# Patient Record
Sex: Female | Born: 1950 | Race: Black or African American | Hispanic: No | Marital: Married | Smoking: Former smoker
Health system: Southern US, Community
[De-identification: ages and names within clinical notes are randomized; demographics above are authoritative.]

## PROBLEM LIST (undated history)

## (undated) DIAGNOSIS — D649 Anemia, unspecified: Secondary | ICD-10-CM

## (undated) DIAGNOSIS — C801 Malignant (primary) neoplasm, unspecified: Secondary | ICD-10-CM

## (undated) DIAGNOSIS — Z9221 Personal history of antineoplastic chemotherapy: Secondary | ICD-10-CM

## (undated) HISTORY — PX: ABDOMINAL HYSTERECTOMY: SUR658

## (undated) HISTORY — PX: GANGLION CYST EXCISION: SHX1691

## (undated) HISTORY — PX: OTHER SURGICAL HISTORY: SHX169

## (undated) HISTORY — DX: Malignant (primary) neoplasm, unspecified: C80.1

## (undated) HISTORY — PX: TUBAL LIGATION: SHX77

## (undated) HISTORY — PX: ABDOMINAL HYSTERECTOMY: SHX81

## (undated) HISTORY — DX: Anemia, unspecified: D64.9

---

## 1999-12-01 ENCOUNTER — Other Ambulatory Visit: Admission: RE | Admit: 1999-12-01 | Discharge: 1999-12-01 | Payer: Self-pay | Admitting: *Deleted

## 1999-12-15 ENCOUNTER — Other Ambulatory Visit: Admission: RE | Admit: 1999-12-15 | Discharge: 1999-12-15 | Payer: Self-pay | Admitting: *Deleted

## 1999-12-15 ENCOUNTER — Encounter (INDEPENDENT_AMBULATORY_CARE_PROVIDER_SITE_OTHER): Payer: Self-pay

## 2005-04-06 ENCOUNTER — Ambulatory Visit: Payer: Self-pay | Admitting: Internal Medicine

## 2005-05-11 ENCOUNTER — Ambulatory Visit: Payer: Self-pay | Admitting: Internal Medicine

## 2006-12-31 ENCOUNTER — Encounter: Payer: Self-pay | Admitting: Internal Medicine

## 2008-10-01 ENCOUNTER — Ambulatory Visit: Payer: Self-pay | Admitting: Family Medicine

## 2008-10-01 ENCOUNTER — Encounter (INDEPENDENT_AMBULATORY_CARE_PROVIDER_SITE_OTHER): Payer: Self-pay | Admitting: *Deleted

## 2008-10-01 LAB — CONVERTED CEMR LAB
ALT: 18 units/L (ref 0–35)
AST: 29 units/L (ref 0–37)
Albumin: 3.5 g/dL (ref 3.5–5.2)
Alkaline Phosphatase: 68 units/L (ref 39–117)
BUN: 14 mg/dL (ref 6–23)
Basophils Absolute: 0 10*3/uL (ref 0.0–0.1)
Basophils Relative: 0.2 % (ref 0.0–3.0)
Bilirubin, Direct: 0 mg/dL (ref 0.0–0.3)
CO2: 29 meq/L (ref 19–32)
Calcium: 9.3 mg/dL (ref 8.4–10.5)
Chloride: 108 meq/L (ref 96–112)
Cholesterol: 182 mg/dL (ref 0–200)
Creatinine, Ser: 1.1 mg/dL (ref 0.4–1.2)
Eosinophils Absolute: 0.2 10*3/uL (ref 0.0–0.7)
Eosinophils Relative: 4.2 % (ref 0.0–5.0)
GFR calc non Af Amer: 65.45 mL/min (ref >60)
Glucose, Bld: 88 mg/dL (ref 70–99)
HCT: 37.9 % (ref 36.0–46.0)
HDL: 47.7 mg/dL (ref >39.00)
Hemoglobin: 12.9 g/dL (ref 12.0–15.0)
LDL Cholesterol: 119 mg/dL — ABNORMAL HIGH (ref 0–99)
Lymphocytes Relative: 38.6 % (ref 12.0–46.0)
Lymphs Abs: 1.7 10*3/uL (ref 0.7–4.0)
MCHC: 33.9 g/dL (ref 30.0–36.0)
MCV: 92.5 fL (ref 78.0–100.0)
Monocytes Absolute: 0.4 10*3/uL (ref 0.1–1.0)
Monocytes Relative: 8.7 % (ref 3.0–12.0)
Neutro Abs: 2.1 10*3/uL (ref 1.4–7.7)
Neutrophils Relative %: 48.3 % (ref 43.0–77.0)
Platelets: 277 10*3/uL (ref 150.0–400.0)
Potassium: 4.2 meq/L (ref 3.5–5.1)
RBC: 4.1 M/uL (ref 3.87–5.11)
RDW: 12.8 % (ref 11.5–14.6)
Sodium: 143 meq/L (ref 135–145)
TSH: 0.89 microintl units/mL (ref 0.35–5.50)
Total Bilirubin: 0.7 mg/dL (ref 0.3–1.2)
Total CHOL/HDL Ratio: 4
Total Protein: 7.9 g/dL (ref 6.0–8.3)
Triglycerides: 77 mg/dL (ref 0.0–149.0)
VLDL: 15.4 mg/dL (ref 0.0–40.0)
WBC: 4.4 10*3/uL — ABNORMAL LOW (ref 4.5–10.5)

## 2010-07-12 ENCOUNTER — Encounter: Payer: Self-pay | Admitting: Family Medicine

## 2010-07-13 ENCOUNTER — Encounter: Payer: Self-pay | Admitting: Family Medicine

## 2010-07-22 NOTE — Letter (Signed)
Summary: External Other/MINUTE CLINIC VISIT  External Other/MINUTE CLINIC VISIT   Imported By: Job Founds 01/12/2007 13:46:16  _____________________________________________________________________  External Attachment:    Type:   Image     Comment:   External Document

## 2010-07-22 NOTE — Assessment & Plan Note (Signed)
Summary: new est. cpx/alr   Vital Signs:  Patient profile:   60 year old female Height:      53.75 inches Weight:      184 pounds BMI:     44.94 Pulse rate:   66 / minute Resp:     14 per minute BP sitting:   132 / 84  (left arm)  Vitals Entered By: Doristine Devoid (October 01, 2008 8:07 AM) CC: new est- cpx    Family History:    CAD-no    HTN-no    DM-no    STROKE-no    COLON CA-no    BREAST CA-no   Impression & Recommendations:  Problem # 1:  HEALTHY ADULT FEMALE (ICD-V70.0) Assessment New  Orders: Venipuncture ([address]) Radiology Referral (Radiology) EKG w/ Interpretation ([address]) TLB-Lipid Panel (80061-LIPID) TLB-BMP (Basic Metabolic Panel-BMET) (80048-METABOL) TLB-CBC Platelet - w/Differential (85025-CBCD) TLB-Hepatic/Liver Function Pnl (80076-HEPATIC) TLB-TSH (Thyroid Stimulating Hormone) (84443-TSH)  Problem # 2:  SPECIAL SCREENING FOR MALIGNANT NEOPLASMS COLON (ICD-V76.51)  Orders: Gastroenterology Referral (GI)  Complete Medication List: 1)  Fluticasone Propionate 50 Mcg/act Susp (Fluticasone propionate) .... 2 sprays each nostril once daily  Patient Instructions: 1)  Please schedule a follow-up appointment in 1 year or as needed 2)  We will notify you of your lab results 3)  Someone will call you with your GI and Mammogram appts 4)  Remember to stay as active as possible- work on the diet and exercise now will pay off later 5)  Call with any questions or concerns 6)  Happy Spring! Prescriptions: FLUTICASONE PROPIONATE 50 MCG/ACT  SUSP (FLUTICASONE PROPIONATE) 2 sprays each nostril once daily  #1 x 3   Entered and Authorized by:   Neena Rhymes MD   Signed by:   Neena Rhymes MD on 10/01/2008   Method used:   Print then Give to Patient   RxID:   [id number]   Appended Document: new est. cpx/alr     Vital Signs:  Patient profile:   60 year old female Pulse rate:   66 / minute Resp:     14 per minute BP sitting:   132 /  84  History of Present Illness: 60 yo woman here today for CPE and to establish care.  saw Dr Drue Novel 'once- yrs ago'.  pt had GYN- Mabry but he retired 'years ago'.  has not had a mammogram in 9 yrs, never had colonoscopy, 7-8 yrs since last bimanual exam.  pt w/out complaints today.  Preventive Screening-Counseling & Management     Alcohol drinks/day: 0     Smoking Status: never     Does Patient Exercise: no      Sexual History:  currently monogamous.        Drug Use:  never.    Allergies (verified): No Known Drug Allergies  Past History:  Past Surgical History:    hysterectomy due to fibroids.  OVARIES REMAIN  Social History:    married, 3 children.  daughter in [place], daughter in [place], son locally    Smoking Status:  never    Does Patient Exercise:  no    Sexual History:  currently monogamous    Drug Use:  never  Review of Systems  The patient denies anorexia, fever, weight loss, weight gain, vision loss, decreased hearing, hoarseness, chest pain, syncope, dyspnea on exertion, peripheral edema, prolonged cough, headaches, abdominal pain, melena, hematochezia, hematuria, incontinence, suspicious skin lesions, depression, abnormal bleeding, enlarged lymph nodes, and breast masses.    Physical Exam  General:  Well-developed,well-nourished,in no acute distress; alert,appropriate and cooperative throughout examination Head:  Normocephalic and atraumatic without obvious abnormalities. No apparent alopecia or balding. Eyes:  No corneal or conjunctival inflammation noted. EOMI. Perrla. Funduscopic exam benign, without hemorrhages, exudates or papilledema. Vision grossly normal. Ears:  External ear exam shows no significant lesions or deformities.  Otoscopic examination reveals clear canals, tympanic membranes are intact bilaterally without bulging, retraction, inflammation or discharge. Hearing is grossly normal bilaterally. Nose:  edematous turbinates Mouth:  + post nasal  drip Neck:  No deformities, masses, or tenderness noted. Breasts:  No mass, nodules, thickening, tenderness, bulging, retraction, inflamation, nipple discharge or skin changes noted.   Lungs:  Normal respiratory effort, chest expands symmetrically. Lungs are clear to auscultation, no crackles or wheezes. Heart:  Normal rate and regular rhythm. S1 and S2 normal without gallop, murmur, click, rub or other extra sounds. Abdomen:  Bowel sounds positive,abdomen soft and non-tender without masses, organomegaly or hernias noted. Genitalia:  no fullness or adnexal tenderness on bimanual exam.  normal external genitalia Msk:  No deformity or scoliosis noted of thoracic or lumbar spine.   Pulses:  +2 carotid, radial, DP Extremities:  No clubbing, cyanosis, edema, or deformity noted with normal full range of motion of all joints.   Neurologic:  No cranial nerve deficits noted. Station and gait are normal. Plantar reflexes are down-going bilaterally. DTRs are symmetrical throughout. Sensory, motor and coordinative functions appear intact. Skin:  Intact without suspicious lesions or rashes Cervical Nodes:  No lymphadenopathy noted Axillary Nodes:  No palpable lymphadenopathy Psych:  Cognition and judgment appear intact. Alert and cooperative with normal attention span and concentration. No apparent delusions, illusions, hallucinations   Impression & Recommendations:  Problem # 1:  HEALTHY ADULT FEMALE (ICD-V70.0) pt's PE WNL.  stressed importance of diet, exercise, and routine health maintainence.  labs collected.  will refer for mammo and colonoscopy.  Pt expresses understanding and is in agreement w/ this plan.  Complete Medication List: 1)  Fluticasone Propionate 50 Mcg/act Susp (Fluticasone propionate) .... 2 sprays each nostril once daily

## 2010-07-22 NOTE — Letter (Signed)
Summary: Results Follow up Letter  [place] [facility]  [address] [place], [place] [address]   Phone: [phone]  Fax: [phone]    10/01/2008 MRN: [id number]  Margaret Elliott [address] [place], [place]  [address]  Dear [name],  The following are the results of your recent test(s):  Test         Result    Pap Smear:        Normal _____  Not Normal _____ Comments: ______________________________________________________ Cholesterol: LDL(Bad cholesterol):         Your goal is less than:         HDL (Good cholesterol):       Your goal is more than: Comments:  ______________________________________________________ Mammogram:        Normal _____  Not Normal _____ Comments:  ___________________________________________________________________ Hemoccult:        Normal _____  Not normal _______ Comments:    _____________________________________________________________________ Other Tests: PLEASE SEE COPY OF LABS FROM 10/01/08- keep up the good work!  labs look great!    We routinely do not discuss normal results over the telephone.  If you desire a copy of the results, or you have any questions about this information we can discuss them at your next office visit.   Sincerely,

## 2011-11-10 ENCOUNTER — Ambulatory Visit: Payer: BC Managed Care – PPO | Admitting: Family Medicine

## 2011-11-10 VITALS — BP 159/82 | HR 81 | Temp 97.9°F | Resp 18 | Ht 64.5 in | Wt 185.0 lb

## 2011-11-10 DIAGNOSIS — J0100 Acute maxillary sinusitis, unspecified: Secondary | ICD-10-CM

## 2011-11-10 DIAGNOSIS — J329 Chronic sinusitis, unspecified: Secondary | ICD-10-CM

## 2011-11-10 DIAGNOSIS — J029 Acute pharyngitis, unspecified: Secondary | ICD-10-CM

## 2011-11-10 LAB — POCT CBC
Granulocyte percent: 62.4 %G (ref 37–80)
Hemoglobin: 11.3 g/dL — AB (ref 12.2–16.2)
MCH, POC: 28.6 pg (ref 27–31.2)
MCHC: 31.6 g/dL — AB (ref 31.8–35.4)
MCV: 90.7 fL (ref 80–97)
MID (cbc): 0.7 (ref 0–0.9)
MPV: 8.3 fL (ref 0–99.8)
POC Granulocyte: 5.4 (ref 2–6.9)
POC LYMPH PERCENT: 30 %L (ref 10–50)
POC MID %: 7.6 %M (ref 0–12)
Platelet Count, POC: 378 10*3/uL (ref 142–424)
RBC: 3.95 M/uL — AB (ref 4.04–5.48)
WBC: 8.6 10*3/uL (ref 4.6–10.2)

## 2011-11-10 MED ORDER — AMOXICILLIN 500 MG PO CAPS: 500.0000 mg | ORAL_CAPSULE | Freq: Three times a day (TID) | ORAL | Status: AC

## 2011-11-10 MED ORDER — PREDNISONE 20 MG PO TABS: 20.0000 mg | ORAL_TABLET | Freq: Every day | ORAL | Status: AC

## 2011-11-10 NOTE — Progress Notes (Signed)
  Subjective:    Patient ID: Margaret Elliott, female    DOB: 1951-02-09, 61 y.o.   MRN: [id number]  HPI  1 week history of severe ST, pnd and cough. Friday seen at a local [facility] and diagnosed with seasonal rhinitis after rapid strep negative E prescibed cough suppressant   Fever and chills Review of Systems     Objective:   Physical Exam  Constitutional: She appears well-developed.  HENT:  Right Ear: Tympanic membrane is retracted.  Left Ear: Tympanic membrane is retracted.  Nose: Mucosal edema present. Rhinorrhea: purulent.  Mouth/Throat: Posterior oropharyngeal edema (associated wit hpurulent PND) present.  Eyes: Conjunctivae are normal.  Neck: Neck supple.  Cardiovascular: Normal rate, regular rhythm and normal heart sounds.   Pulmonary/Chest: Effort normal and breath sounds normal.  Lymphadenopathy:    She has cervical adenopathy.  Skin: Skin is warm.          Results for orders placed in visit on 11/10/11  POCT CBC      Component Value Range   WBC 8.6  4.6 - 10.2 (K/uL)   Lymph, poc 2.6  0.6 - 3.4    POC LYMPH PERCENT 30.0  10 - 50 (%L)   MID (cbc) 0.7  0 - 0.9    POC MID % 7.6  0 - 12 (%M)   POC Granulocyte 5.4  2 - 6.9    Granulocyte percent 62.4  37 - 80 (%G)   RBC 3.95 (*) 4.04 - 5.48 (M/uL)   Hemoglobin 11.3 (*) 12.2 - 16.2 (g/dL)   HCT, POC 40.9 (*) 81.1 - 47.9 (%)   MCV 90.7  80 - 97 (fL)   MCH, POC 28.6  27 - 31.2 (pg)   MCHC 31.6 (*) 31.8 - 35.4 (g/dL)   RDW, POC 91.4     Platelet Count, POC 378  142 - 424 (K/uL)   MPV 8.3  0 - 99.8 (fL)     Assessment & Plan:  Sinusitis Pharyngitis secondary to PND  See medications on AVS Anticipatory guidance

## 2011-11-23 ENCOUNTER — Other Ambulatory Visit: Payer: Self-pay | Admitting: Family Medicine

## 2011-11-26 ENCOUNTER — Other Ambulatory Visit: Payer: Self-pay | Admitting: Family Medicine

## 2012-07-06 ENCOUNTER — Encounter: Payer: Self-pay | Admitting: Family Medicine

## 2012-09-30 ENCOUNTER — Ambulatory Visit (HOSPITAL_BASED_OUTPATIENT_CLINIC_OR_DEPARTMENT_OTHER)
Admission: RE | Admit: 2012-09-30 | Discharge: 2012-09-30 | Disposition: A | Discharge status: Home / Self Care | Payer: BC Managed Care – PPO | Source: Ambulatory Visit | Attending: Family Medicine | Admitting: Family Medicine

## 2012-09-30 ENCOUNTER — Ambulatory Visit (INDEPENDENT_AMBULATORY_CARE_PROVIDER_SITE_OTHER): Payer: BC Managed Care – PPO | Admitting: Family Medicine

## 2012-09-30 VITALS — BP 120/78 | HR 102 | Temp 98.1°F | Wt 166.0 lb

## 2012-09-30 DIAGNOSIS — R102 Pelvic and perineal pain unspecified side: Secondary | ICD-10-CM | POA: Insufficient documentation

## 2012-09-30 DIAGNOSIS — N949 Unspecified condition associated with female genital organs and menstrual cycle: Secondary | ICD-10-CM

## 2012-09-30 DIAGNOSIS — Z90710 Acquired absence of both cervix and uterus: Secondary | ICD-10-CM | POA: Insufficient documentation

## 2012-09-30 LAB — BASIC METABOLIC PANEL
Chloride: 105 mEq/L (ref 96–112)
Creat: 1.06 mg/dL (ref 0.50–1.10)
Potassium: 4.2 mEq/L (ref 3.5–5.3)
Sodium: 138 mEq/L (ref 135–145)

## 2012-09-30 LAB — POCT URINALYSIS DIPSTICK
Bilirubin, UA: NEGATIVE
Glucose, UA: NEGATIVE
Leukocytes, UA: NEGATIVE
Nitrite, UA: NEGATIVE
Urobilinogen, UA: 0.2

## 2012-09-30 LAB — CBC WITH DIFFERENTIAL/PLATELET
Basophils Absolute: 0 10*3/uL (ref 0.0–0.1)
Eosinophils Relative: 2 % (ref 0–5)
Lymphocytes Relative: 23 % (ref 12–46)
Neutro Abs: 4.9 10*3/uL (ref 1.7–7.7)
Neutrophils Relative %: 66 % (ref 43–77)
Platelets: 430 10*3/uL — ABNORMAL HIGH (ref 150–400)
RDW: 17.8 % — ABNORMAL HIGH (ref 11.5–15.5)
WBC: 7.5 10*3/uL (ref 4.0–10.5)

## 2012-09-30 NOTE — Assessment & Plan Note (Signed)
New.  R sided.  No obvious cause.  Pt s/p hysterectomy but ovaries remain.  Get labs including CA125.  [place] to assess.  UA WNL.  Next steps depending on results.  Reviewed supportive care and red flags that should prompt return.  Pt expressed understanding and is in agreement w/ plan.

## 2012-09-30 NOTE — Progress Notes (Signed)
  Subjective:    Patient ID: Margaret Elliott, female    DOB: [birth year], 62 y.o.   MRN: [id number]  HPI abd pain- having intermittent abdominal pain occuring on either side of central abdomen.  Will have increased intra-abdominal pressure while standing.  + TTP over suprapubic area.  2 yrs ago had similar sxs- went to GYN and Urology.  Last GYN exam was Dec 2012.  No N/V.  Pt felt constipated a couple of weeks ago- started Miralax and fiber and sxs resolved.  No vaginal bleeding.  No change in appetite.  Pt reports feeling bloated around the middle.  No early satiety.  No sensation of things dropping when bearing down for BM.   Review of Systems For ROS see HPI     Objective:   Physical Exam  Vitals reviewed. Constitutional: She appears well-developed and well-nourished. No distress.  Cardiovascular: Normal rate, regular rhythm, normal heart sounds and intact distal pulses.   Pulmonary/Chest: Effort normal and breath sounds normal. No respiratory distress. She has no wheezes. She has no rales.  Abdominal: Soft. Bowel sounds are normal. She exhibits distension. She exhibits no mass. There is tenderness (R pelvic tenderness on both internal and external exams). There is no rebound and no guarding.  Genitourinary:  Uterus surgically absent No obvious adnexal mass on bimanual exam          Assessment & Plan:

## 2012-09-30 NOTE — Patient Instructions (Addendum)
We'll notify you of your lab results and your ultrasound Based on this, we'll determine the next steps Call with any questions or concerns If your symptoms change or worsen, please go to the ER Hang in there!!!

## 2012-10-01 LAB — CA 125: CA 125: 3.9 U/mL (ref 0.0–30.2)

## 2012-10-04 ENCOUNTER — Telehealth: Payer: Self-pay | Admitting: *Deleted

## 2012-10-04 DIAGNOSIS — D649 Anemia, unspecified: Secondary | ICD-10-CM

## 2012-10-04 NOTE — Telephone Encounter (Signed)
Pt wants to wait for Dr Beverely Low

## 2012-10-04 NOTE — Telephone Encounter (Signed)
Message copied by Verdene Rio on Tue Oct 04, 2012  4:05 PM ------      Message from: Sheliah Hatch      Created: Sun Oct 02, 2012  5:52 PM       Pt w/ anemia- based on this, needs GI referral to determine cause. ------

## 2012-10-04 NOTE — Telephone Encounter (Signed)
Pt stated that since labs and U/S were negative she wanted to know what is the next step. Pt is aware Dr Beverely Low is out of the office until next week but decline to have concerns address by another provider. Pt schedule for OV on 10-19-12 to discuss the next step since all results were negative..Please advise   Referral placed.

## 2012-10-05 ENCOUNTER — Encounter: Payer: Self-pay | Admitting: Gastroenterology

## 2012-10-10 ENCOUNTER — Encounter: Payer: Self-pay | Admitting: *Deleted

## 2012-10-17 ENCOUNTER — Ambulatory Visit: Payer: BC Managed Care – PPO | Admitting: Gastroenterology

## 2012-10-19 ENCOUNTER — Ambulatory Visit: Payer: BC Managed Care – PPO | Admitting: Family Medicine

## 2012-11-08 ENCOUNTER — Encounter: Payer: Self-pay | Admitting: Gastroenterology

## 2012-11-08 ENCOUNTER — Ambulatory Visit (INDEPENDENT_AMBULATORY_CARE_PROVIDER_SITE_OTHER): Payer: BC Managed Care – PPO | Admitting: Gastroenterology

## 2012-11-08 VITALS — BP 108/66 | HR 108 | Ht 64.5 in | Wt 161.0 lb

## 2012-11-08 DIAGNOSIS — D509 Iron deficiency anemia, unspecified: Secondary | ICD-10-CM

## 2012-11-08 DIAGNOSIS — Z1211 Encounter for screening for malignant neoplasm of colon: Secondary | ICD-10-CM

## 2012-11-08 DIAGNOSIS — D649 Anemia, unspecified: Secondary | ICD-10-CM

## 2012-11-08 MED ORDER — NA SULFATE-K SULFATE-MG SULF 17.5-3.13-1.6 GM/177ML PO SOLN: 1.0000 | Freq: Once | ORAL | Status: DC

## 2012-11-08 NOTE — Patient Instructions (Addendum)

## 2012-11-08 NOTE — Progress Notes (Signed)
History of Present Illness: Margaret Elliott 62 year old Afro-American female referred at the request of Dr. Beverely Low for evaluation of anemia. Approximately 6 weeks ago she was complaining of lower abdominal pain, particularly in the right side. This subsided after approximately 2 weeks. Pain was unrelated to eating or bowel movements. Incidentally noted was a microcytic anemia. Hemoglobin in April was 9.4 and MCV 78.8. One year earlier hemoglobin was 11.3. The patient denies change of bowel habits, melena or hematochezia. She's on no gastric irritants including nonsteroidals. There is no history of menstrual bleeding. On one occasion she saw some blood in the urine.    Past Medical History  Diagnosis Date  . Anemia    Past Surgical History  Procedure Laterality Date  . Abdominal hysterectomy     family history includes COPD in her mother and Heart disease in her mother. Current Outpatient Prescriptions  Medication Sig Dispense Refill  . diphenhydrAMINE (BENADRYL) 25 mg capsule Take 25 mg by mouth every 6 (six) hours as needed for itching.      . Multiple Vitamin (MULTIVITAMIN) tablet Take 1 tablet by mouth daily.       No current facility-administered medications for this visit.   Allergies as of 11/08/2012  . (No Known Allergies)    reports that she has quit smoking. She has never used smokeless tobacco. She reports that  drinks alcohol. She reports that she does not use illicit drugs.     Review of Systems: She is recently been experiencing swelling of her lips and eyes which she feels is an allergic reaction although she's not sure what is causing this. Pertinent positive and negative review of systems were noted in the above HPI section. All other review of systems were otherwise negative.  Vital signs were reviewed in today's medical record Physical Exam: General: Well developed , well nourished, no acute distress Skin: anicteric Head: Normocephalic and atraumatic Eyes:  sclerae  anicteric, EOMI Ears: Normal auditory acuity Mouth: No deformity or lesions Neck: Supple, no masses or thyromegaly Lungs: Clear throughout to auscultation Heart: Regular rate and rhythm; no murmurs, rubs or bruits Abdomen: Soft, non tender and non distended. No masses, hepatosplenomegaly or hernias noted. Normal Bowel sounds Rectal:deferred Musculoskeletal: Symmetrical with no gross deformities  Skin: No lesions on visible extremities Pulses:  Normal pulses noted Extremities: No clubbing, cyanosis, edema or deformities noted Neurological: Alert oriented x 4, grossly nonfocal Cervical Nodes:  No significant cervical adenopathy Inguinal Nodes: No significant inguinal adenopathy Psychological:  Alert and cooperative. Normal mood and affect

## 2012-11-08 NOTE — Assessment & Plan Note (Signed)
Microcytic anemia is most likely due to iron deficiency. Chronic GI bleeding sources should be ruled out including polyps, AVMs, neoplasm and ulcer.  Recommendations #1 colonoscopy #2 Hemoccults #3 upper endoscopy if colonoscopy is negative for a GI bleeding source

## 2012-11-11 ENCOUNTER — Encounter: Payer: BC Managed Care – PPO | Admitting: Gastroenterology

## 2012-11-11 ENCOUNTER — Encounter: Payer: Self-pay | Admitting: Family Medicine

## 2012-11-18 ENCOUNTER — Ambulatory Visit (AMBULATORY_SURGERY_CENTER): Payer: BC Managed Care – PPO | Admitting: Gastroenterology

## 2012-11-18 ENCOUNTER — Encounter: Payer: Self-pay | Admitting: Gastroenterology

## 2012-11-18 VITALS — BP 118/75 | HR 70 | Temp 97.8°F | Resp 20 | Ht 64.0 in | Wt 161.0 lb

## 2012-11-18 DIAGNOSIS — Z1211 Encounter for screening for malignant neoplasm of colon: Secondary | ICD-10-CM

## 2012-11-18 DIAGNOSIS — D509 Iron deficiency anemia, unspecified: Secondary | ICD-10-CM

## 2012-11-18 DIAGNOSIS — K5730 Diverticulosis of large intestine without perforation or abscess without bleeding: Secondary | ICD-10-CM

## 2012-11-18 MED ORDER — SODIUM CHLORIDE 0.9 % IV SOLN: 500.0000 mL | INTRAVENOUS | Status: DC

## 2012-11-18 NOTE — Patient Instructions (Addendum)
YOU HAD AN ENDOSCOPIC PROCEDURE TODAY AT THE [facility]: Refer to the procedure report that was given to you for any specific questions about what was found during the examination.  If the procedure report does not answer your questions, please call your gastroenterologist to clarify.  If you requested that your care partner not be given the details of your procedure findings, then the procedure report has been included in a sealed envelope for you to review at your convenience later.  YOU SHOULD EXPECT: Some feelings of bloating in the abdomen. Passage of more gas than usual.  Walking can help get rid of the air that was put into your GI tract during the procedure and reduce the bloating. If you had a lower endoscopy (such as a colonoscopy or flexible sigmoidoscopy) you may notice spotting of blood in your stool or on the toilet paper. If you underwent a bowel prep for your procedure, then you may not have a normal bowel movement for a few days.  DIET: Your first meal following the procedure should be a light meal and then it is ok to progress to your normal diet.  A half-sandwich or bowl of soup is an example of a good first meal.  Heavy or fried foods are harder to digest and may make you feel nauseous or bloated.  Likewise meals heavy in dairy and vegetables can cause extra gas to form and this can also increase the bloating.  Drink plenty of fluids but you should avoid alcoholic beverages for 24 hours.  ACTIVITY: Your care partner should take you home directly after the procedure.  You should plan to take it easy, moving slowly for the rest of the day.  You can resume normal activity the day after the procedure however you should NOT DRIVE or use heavy machinery for 24 hours (because of the sedation medicines used during the test).    SYMPTOMS TO REPORT IMMEDIATELY: A gastroenterologist can be reached at any hour.  During normal business hours, 8:30 AM to 5:00 PM Monday through Friday,  call [phone].  After hours and on weekends, please call the GI answering service at [phone] who will take a message and have the physician on call contact you.   Following lower endoscopy (colonoscopy or flexible sigmoidoscopy):  Excessive amounts of blood in the stool  Significant tenderness or worsening of abdominal pains  Swelling of the abdomen that is new, acute  Fever of 100F or higher    FOLLOW UP: If any biopsies were taken you will be contacted by phone or by letter within the next 1-3 weeks.  Call your gastroenterologist if you have not heard about the biopsies in 3 weeks.  Our staff will call the home number listed on your records the next business day following your procedure to check on you and address any questions or concerns that you may have at that time regarding the information given to you following your procedure. This is a courtesy call and so if there is no answer at the home number and we have not heard from you through the emergency physician on call, we will assume that you have returned to your regular daily activities without incident.  SIGNATURES/CONFIDENTIALITY: You and/or your care partner have signed paperwork which will be entered into your electronic medical record.  These signatures attest to the fact that that the information above on your After Visit Summary has been reviewed and is understood.  Full responsibility of the confidentiality   of this discharge information lies with you and/or your care-partner.   Information on diverticulosis & high fiber diet given to you today  Cards to check stool for blood given to you today . Wait one week before doing these.

## 2012-11-18 NOTE — Op Note (Signed)
[facility] [address] [place], [address]   COLONOSCOPY PROCEDURE REPORT  PATIENT: Margaret Elliott, Margaret Elliott  MR#: [id number] BIRTHDATE: 10-03-1950 , 61  yrs. old GENDER: Female ENDOSCOPIST: Louis Meckel, MD REFERRED [id number] Assunta Found, M.D. PROCEDURE DATE:  11/18/2012 PROCEDURE:   Colonoscopy, diagnostic ASA CLASS:   Class II INDICATIONS:Iron Deficiency Anemia. MEDICATIONS: MAC sedation, administered by CRNA and propofol (Diprivan) 200mg  IV  DESCRIPTION OF PROCEDURE:   After the risks benefits and alternatives of the procedure were thoroughly explained, informed consent was obtained.  A digital rectal exam revealed no abnormalities of the rectum.   The LB [id number]  endoscope was introduced through the anus and advanced to the cecum, which was identified by the ileocecal valve. No adverse events experienced.   The quality of the prep was excellent using Suprep The instrument was then slowly withdrawn as the colon was fully examined.      COLON FINDINGS: Mild diverticulosis was noted in the ascending colon.   The colon mucosa was otherwise normal.  Retroflexed views revealed no abnormalities. The time to cecum=5 minutes 57 seconds. Withdrawal time=6 minutes 48 seconds.  The scope was withdrawn and the procedure completed. COMPLICATIONS: There were no complications.  ENDOSCOPIC IMPRESSION: 1.   Mild diverticulosis was noted in the ascending colon 2.   The colon mucosa was otherwise normal  RECOMMENDATIONS: 1.  Upper endoscopy will be scheduled 2.  Followup hemeoccults in 1 week 3.  Repeat Colonscopy in 10 years.   eSigned:  Louis Meckel, MD 11/18/2012 9:22 AM   cc:

## 2012-11-18 NOTE — Progress Notes (Signed)
Report to pacu rn, vss, bbs=clear, had reflux up during case immediately suctioned airway reflexes intact, bbs=clear throughout, SA02 98-100%

## 2012-11-18 NOTE — Progress Notes (Signed)
Patient did not experience any of the following events: a burn prior to discharge; a fall within the facility; wrong site/side/patient/procedure/implant event; or a hospital transfer or hospital admission upon discharge from the facility. (G8907) Patient did not have preoperative order for IV antibiotic SSI prophylaxis. (G8918)  

## 2012-11-21 ENCOUNTER — Other Ambulatory Visit: Payer: Self-pay | Admitting: Gastroenterology

## 2012-11-21 ENCOUNTER — Telehealth: Payer: Self-pay | Admitting: *Deleted

## 2012-11-21 DIAGNOSIS — D649 Anemia, unspecified: Secondary | ICD-10-CM

## 2012-11-21 NOTE — Telephone Encounter (Signed)
  Follow up Call-  Call back number 11/18/2012  Post procedure Call Back phone  # [phone]  Permission to leave phone message Yes     Patient questions:  Do you have a fever, pain , or abdominal swelling? yes Pain Score  3 * Had it before procedure  Have you tolerated food without any problems? yes  Have you been able to return to your normal activities? yes  Do you have any questions about your discharge instructions: Diet   no Medications  no Follow up visit  no  Do you have questions or concerns about your Care? no  Actions: * If pain score is 4 or above: No action needed, pain <4.

## 2012-11-23 ENCOUNTER — Telehealth: Payer: Self-pay | Admitting: *Deleted

## 2012-11-23 ENCOUNTER — Telehealth: Payer: Self-pay

## 2012-11-23 NOTE — Telephone Encounter (Signed)
Per procedure note pt was to have an EGD scheduled. Pt wants to wait until she speaks with Dr. Beverely Low and states she will call back.

## 2012-11-23 NOTE — Telephone Encounter (Signed)
Patient had a colonoscopy on 5/30 with Dr Arlyce Dice and an upper endoscopy was to be scheduled in recovery.  Called pt to schedule the procedure.  Pt states that she would like to speak with her primary MD, Dr Beverely Low prior to scheduling an EGD.  Pt will call and schedule if she decides to proceed.  She will complete stool cards next week.

## 2012-11-23 NOTE — Telephone Encounter (Signed)
Message copied by Chrystie Nose on Wed Nov 23, 2012  1:48 PM ------      Message from: Darrel Hoover      Created: Wed Nov 23, 2012  1:35 PM       Bonita Quin,      I spoke with the patient and she would like to hold off on scheduling her EGD until she speaks with Dr. Beverely Low.  She will call and schedule if she decides to proceed.              Dorene Grebe ------

## 2012-11-29 ENCOUNTER — Ambulatory Visit (INDEPENDENT_AMBULATORY_CARE_PROVIDER_SITE_OTHER): Payer: BC Managed Care – PPO | Admitting: Family Medicine

## 2012-11-29 ENCOUNTER — Encounter: Payer: Self-pay | Admitting: Family Medicine

## 2012-11-29 VITALS — BP 118/80 | HR 90 | Temp 98.3°F | Ht 62.25 in | Wt 161.2 lb

## 2012-11-29 DIAGNOSIS — R1031 Right lower quadrant pain: Secondary | ICD-10-CM

## 2012-11-29 LAB — CBC WITH DIFFERENTIAL/PLATELET
Basophils Relative: 0.4 % (ref 0.0–3.0)
Eosinophils Relative: 1.5 % (ref 0.0–5.0)
HCT: 31.3 % — ABNORMAL LOW (ref 36.0–46.0)
Hemoglobin: 10.1 g/dL — ABNORMAL LOW (ref 12.0–15.0)
MCHC: 32.3 g/dL (ref 30.0–36.0)
MCV: 83.7 fl (ref 78.0–100.0)
Monocytes Absolute: 0.6 10*3/uL (ref 0.1–1.0)
Neutro Abs: 6.2 10*3/uL (ref 1.4–7.7)
Neutrophils Relative %: 74.7 % (ref 43.0–77.0)
RBC: 3.73 Mil/uL — ABNORMAL LOW (ref 3.87–5.11)
WBC: 8.2 10*3/uL (ref 4.5–10.5)

## 2012-11-29 LAB — HEPATIC FUNCTION PANEL
ALT: 15 U/L (ref 0–35)
Bilirubin, Direct: 0 mg/dL (ref 0.0–0.3)
Total Bilirubin: 0.5 mg/dL (ref 0.3–1.2)

## 2012-11-29 LAB — BASIC METABOLIC PANEL
BUN: 17 mg/dL (ref 6–23)
Calcium: 9.2 mg/dL (ref 8.4–10.5)
Creatinine, Ser: 1 mg/dL (ref 0.4–1.2)
GFR: 71.23 mL/min (ref >60.00)

## 2012-11-29 NOTE — Patient Instructions (Addendum)
We'll notify you of your lab results and get your CT all set up Call Dr Arlyce Dice and discuss setting up the endoscopy We're going to try and figure this out! Hang in there!!!

## 2012-11-29 NOTE — Progress Notes (Signed)
  Subjective:    Patient ID: Margaret Elliott, female    DOB: Sep 03, 1950, 62 y.o.   MRN: [id number]  HPI abd pain- pt reports similar sxs to previous (last seen April).  Now unable to sleep due to pain.  Pain is now localized to R side, lower quadrant.  Been present for months.  Improved from last visit but persistent.  No N/V.  No changes to bowels.  Had colonoscopy.  There was talk of scheduling an endoscopy but pt has not scheduled.     Review of Systems For ROS see HPI     Objective:   Physical Exam  Vitals reviewed. Constitutional: She is oriented to person, place, and time. She appears well-developed and well-nourished. No distress.  Cardiovascular: Normal rate, regular rhythm, normal heart sounds and intact distal pulses.   Pulmonary/Chest: Effort normal and breath sounds normal. No respiratory distress. She has no wheezes. She has no rales.  Abdominal: Soft. Bowel sounds are normal. She exhibits no distension. There is tenderness (RLQ TTP). There is no rebound and no guarding.  Neurological: She is alert and oriented to person, place, and time.  Skin: Skin is warm and dry.          Assessment & Plan:

## 2012-11-29 NOTE — Assessment & Plan Note (Signed)
New.  Pain is now much more localized to RLQ rather than pelvic pain.  Had normal pelvic US.  Had normal colonoscopy.  Encouraged her to schedule endoscopy.  Check labs.  Get CT scan to further evaluate.  Pt expressed understanding and is in agreement w/ plan.

## 2012-11-30 ENCOUNTER — Telehealth: Payer: Self-pay | Admitting: Family Medicine

## 2012-11-30 NOTE — Telephone Encounter (Signed)
In reference to CT Abd/Pelvis with, prior approval was obtained by [facility].  I called to inform patient, and she has now decided she wants a 2nd opinion.  States she just had prior CT 18 months ago, does not want to be exposed to more radiation.  She plans to see her gynecologist.  Once she makes her final decision, she states she will call me back.  I did inform patient her prior auth expires 12/28/12.

## 2012-12-08 ENCOUNTER — Other Ambulatory Visit (INDEPENDENT_AMBULATORY_CARE_PROVIDER_SITE_OTHER): Payer: BC Managed Care – PPO

## 2012-12-08 DIAGNOSIS — D649 Anemia, unspecified: Secondary | ICD-10-CM

## 2012-12-08 LAB — HEMOCCULT SLIDES (X 3 CARDS)
Fecal Occult Blood: NEGATIVE
OCCULT 2: NEGATIVE
OCCULT 5: NEGATIVE

## 2012-12-09 ENCOUNTER — Emergency Department (HOSPITAL_COMMUNITY): Payer: BC Managed Care – PPO

## 2012-12-09 ENCOUNTER — Encounter (HOSPITAL_COMMUNITY): Payer: Self-pay | Admitting: Certified Registered"

## 2012-12-09 ENCOUNTER — Inpatient Hospital Stay (HOSPITAL_COMMUNITY): Payer: BC Managed Care – PPO | Admitting: Certified Registered"

## 2012-12-09 ENCOUNTER — Encounter (HOSPITAL_COMMUNITY): Admission: EM | Disposition: A | Discharge status: Home / Self Care | Payer: Self-pay | Source: Home / Self Care

## 2012-12-09 ENCOUNTER — Encounter (HOSPITAL_COMMUNITY): Payer: Self-pay | Admitting: *Deleted

## 2012-12-09 ENCOUNTER — Inpatient Hospital Stay (HOSPITAL_COMMUNITY): Payer: BC Managed Care – PPO

## 2012-12-09 ENCOUNTER — Inpatient Hospital Stay (HOSPITAL_COMMUNITY)
Admission: EM | Admit: 2012-12-09 | Discharge: 2012-12-16 | DRG: 148 | Disposition: A | Discharge status: Home / Self Care | Payer: BC Managed Care – PPO | Attending: General Surgery | Admitting: General Surgery

## 2012-12-09 DIAGNOSIS — D490 Neoplasm of unspecified behavior of digestive system: Secondary | ICD-10-CM

## 2012-12-09 DIAGNOSIS — K6389 Other specified diseases of intestine: Secondary | ICD-10-CM

## 2012-12-09 DIAGNOSIS — N731 Chronic parametritis and pelvic cellulitis: Secondary | ICD-10-CM | POA: Diagnosis present

## 2012-12-09 DIAGNOSIS — C189 Malignant neoplasm of colon, unspecified: Principal | ICD-10-CM | POA: Diagnosis present

## 2012-12-09 DIAGNOSIS — R55 Syncope and collapse: Secondary | ICD-10-CM | POA: Diagnosis present

## 2012-12-09 DIAGNOSIS — K3580 Unspecified acute appendicitis: Secondary | ICD-10-CM | POA: Diagnosis present

## 2012-12-09 DIAGNOSIS — K769 Liver disease, unspecified: Secondary | ICD-10-CM | POA: Diagnosis present

## 2012-12-09 DIAGNOSIS — K37 Unspecified appendicitis: Secondary | ICD-10-CM

## 2012-12-09 DIAGNOSIS — K560 Paralytic ileus: Secondary | ICD-10-CM | POA: Diagnosis present

## 2012-12-09 DIAGNOSIS — D500 Iron deficiency anemia secondary to blood loss (chronic): Secondary | ICD-10-CM | POA: Diagnosis present

## 2012-12-09 DIAGNOSIS — R1031 Right lower quadrant pain: Secondary | ICD-10-CM

## 2012-12-09 DIAGNOSIS — K7689 Other specified diseases of liver: Secondary | ICD-10-CM | POA: Diagnosis present

## 2012-12-09 DIAGNOSIS — K929 Disease of digestive system, unspecified: Secondary | ICD-10-CM | POA: Diagnosis present

## 2012-12-09 DIAGNOSIS — D62 Acute posthemorrhagic anemia: Secondary | ICD-10-CM | POA: Diagnosis present

## 2012-12-09 DIAGNOSIS — K3533 Acute appendicitis with perforation and localized peritonitis, with abscess: Secondary | ICD-10-CM | POA: Diagnosis present

## 2012-12-09 DIAGNOSIS — D649 Anemia, unspecified: Secondary | ICD-10-CM

## 2012-12-09 DIAGNOSIS — Z87891 Personal history of nicotine dependence: Secondary | ICD-10-CM

## 2012-12-09 DIAGNOSIS — Z5331 Laparoscopic surgical procedure converted to open procedure: Secondary | ICD-10-CM

## 2012-12-09 HISTORY — PX: PARTIAL COLECTOMY: SHX5273

## 2012-12-09 HISTORY — PX: LAPAROSCOPY: SHX197

## 2012-12-09 HISTORY — PX: APPENDECTOMY: SHX54

## 2012-12-09 LAB — CBC WITH DIFFERENTIAL/PLATELET
Basophils Absolute: 0 10*3/uL (ref 0.0–0.1)
Basophils Relative: 0 % (ref 0–1)
Eosinophils Absolute: 0 10*3/uL (ref 0.0–0.7)
Eosinophils Relative: 0 % (ref 0–5)
HCT: 30.7 % — ABNORMAL LOW (ref 36.0–46.0)
Hemoglobin: 9.9 g/dL — ABNORMAL LOW (ref 12.0–15.0)
Lymphocytes Relative: 7 % — ABNORMAL LOW (ref 12–46)
Lymphs Abs: 1.2 10*3/uL (ref 0.7–4.0)
MCH: 26.3 pg (ref 26.0–34.0)
MCHC: 32.2 g/dL (ref 30.0–36.0)
MCV: 81.6 fL (ref 78.0–100.0)
Monocytes Absolute: 0.9 10*3/uL (ref 0.1–1.0)
Monocytes Relative: 5 % (ref 3–12)
Neutro Abs: 16.1 10*3/uL — ABNORMAL HIGH (ref 1.7–7.7)
Neutrophils Relative %: 88 % — ABNORMAL HIGH (ref 43–77)
Platelets: 408 10*3/uL — ABNORMAL HIGH (ref 150–400)
RBC: 3.76 MIL/uL — ABNORMAL LOW (ref 3.87–5.11)
RDW: 16.1 % — ABNORMAL HIGH (ref 11.5–15.5)
WBC: 18.2 10*3/uL — ABNORMAL HIGH (ref 4.0–10.5)

## 2012-12-09 LAB — URINALYSIS, ROUTINE W REFLEX MICROSCOPIC
Bilirubin Urine: NEGATIVE
Glucose, UA: NEGATIVE mg/dL
Hgb urine dipstick: NEGATIVE
Ketones, ur: NEGATIVE mg/dL
Nitrite: NEGATIVE
Protein, ur: NEGATIVE mg/dL
Specific Gravity, Urine: 1.013 (ref 1.005–1.030)
Urobilinogen, UA: 0.2 mg/dL (ref 0.0–1.0)
pH: 6.5 (ref 5.0–8.0)

## 2012-12-09 LAB — COMPREHENSIVE METABOLIC PANEL
ALT: 12 U/L (ref 0–35)
AST: 16 U/L (ref 0–37)
Albumin: 3.1 g/dL — ABNORMAL LOW (ref 3.5–5.2)
Alkaline Phosphatase: 86 U/L (ref 39–117)
Chloride: 102 mEq/L (ref 96–112)
Potassium: 4.2 mEq/L (ref 3.5–5.1)
Total Bilirubin: 0.3 mg/dL (ref 0.3–1.2)

## 2012-12-09 LAB — URINE MICROSCOPIC-ADD ON

## 2012-12-09 LAB — ABO/RH: ABO/RH(D): O POS

## 2012-12-09 LAB — SURGICAL PCR SCREEN
MRSA, PCR: NEGATIVE
Staphylococcus aureus: NEGATIVE

## 2012-12-09 LAB — PREPARE RBC (CROSSMATCH)

## 2012-12-09 LAB — LIPASE, BLOOD: Lipase: 22 U/L (ref 11–59)

## 2012-12-09 SURGERY — LAPAROSCOPY, DIAGNOSTIC
Anesthesia: General | Site: Abdomen | Laterality: Right | Wound class: Dirty or Infected

## 2012-12-09 MED ORDER — SODIUM CHLORIDE 0.9 % IR SOLN
Status: DC | PRN
  Administered 2012-12-09: 4000 mL

## 2012-12-09 MED ORDER — MORPHINE SULFATE 2 MG/ML IJ SOLN
1.0000 mg | INTRAMUSCULAR | Status: DC | PRN
  Administered 2012-12-13: 2 mg via INTRAVENOUS
  Administered 2012-12-13: 4 mg via INTRAVENOUS
  Administered 2012-12-13 – 2012-12-15 (×9): 2 mg via INTRAVENOUS
  Filled 2012-12-09: qty 2
  Filled 2012-12-09 (×3): qty 1
  Filled 2012-12-09: qty 2
  Filled 2012-12-09 (×6): qty 1

## 2012-12-09 MED ORDER — NALOXONE HCL 0.4 MG/ML IJ SOLN: 0.4000 mg | INTRAMUSCULAR | Status: DC | PRN

## 2012-12-09 MED ORDER — KCL IN DEXTROSE-NACL 20-5-0.45 MEQ/L-%-% IV SOLN
INTRAVENOUS | Status: DC
  Administered 2012-12-09 – 2012-12-16 (×12): via INTRAVENOUS
  Filled 2012-12-09 (×18): qty 1000

## 2012-12-09 MED ORDER — ONDANSETRON HCL 4 MG/2ML IJ SOLN
INTRAMUSCULAR | Status: DC | PRN
  Administered 2012-12-09: 4 mg via INTRAVENOUS

## 2012-12-09 MED ORDER — ACETAMINOPHEN 325 MG PO TABS: 650.0000 mg | ORAL_TABLET | Freq: Four times a day (QID) | ORAL | Status: DC | PRN

## 2012-12-09 MED ORDER — DIPHENHYDRAMINE HCL 50 MG/ML IJ SOLN: 12.5000 mg | Freq: Four times a day (QID) | INTRAMUSCULAR | Status: DC | PRN

## 2012-12-09 MED ORDER — OXYCODONE HCL 5 MG PO TABS: 5.0000 mg | ORAL_TABLET | Freq: Once | ORAL | Status: DC | PRN

## 2012-12-09 MED ORDER — OXYCODONE HCL 5 MG/5ML PO SOLN: 5.0000 mg | Freq: Once | ORAL | Status: DC | PRN

## 2012-12-09 MED ORDER — ACETAMINOPHEN 650 MG RE SUPP: 650.0000 mg | Freq: Four times a day (QID) | RECTAL | Status: DC | PRN

## 2012-12-09 MED ORDER — MORPHINE SULFATE 4 MG/ML IJ SOLN: 4.0000 mg | Freq: Once | INTRAMUSCULAR | Status: DC

## 2012-12-09 MED ORDER — ROCURONIUM BROMIDE 100 MG/10ML IV SOLN
INTRAVENOUS | Status: DC | PRN
  Administered 2012-12-09: 10 mg via INTRAVENOUS
  Administered 2012-12-09: 50 mg via INTRAVENOUS

## 2012-12-09 MED ORDER — LACTATED RINGERS IV BOLUS (SEPSIS)
1000.0000 mL | Freq: Once | INTRAVENOUS | Status: AC
  Administered 2012-12-09: 1000 mL via INTRAVENOUS

## 2012-12-09 MED ORDER — DIPHENHYDRAMINE HCL 12.5 MG/5ML PO ELIX: 12.5000 mg | ORAL_SOLUTION | Freq: Four times a day (QID) | ORAL | Status: DC | PRN

## 2012-12-09 MED ORDER — BUPIVACAINE HCL 0.25 % IJ SOLN
INTRAMUSCULAR | Status: DC | PRN
  Administered 2012-12-09: 4 mL

## 2012-12-09 MED ORDER — FENTANYL CITRATE 0.05 MG/ML IJ SOLN
INTRAMUSCULAR | Status: DC | PRN
  Administered 2012-12-09 (×3): 50 ug via INTRAVENOUS
  Administered 2012-12-09: 100 ug via INTRAVENOUS

## 2012-12-09 MED ORDER — MEPERIDINE HCL 25 MG/ML IJ SOLN: 6.2500 mg | INTRAMUSCULAR | Status: DC | PRN

## 2012-12-09 MED ORDER — HYDROMORPHONE 0.3 MG/ML IV SOLN
INTRAVENOUS | Status: DC
  Administered 2012-12-09: 1.8 mg via INTRAVENOUS
  Administered 2012-12-09: 22:00:00 via INTRAVENOUS
  Administered 2012-12-10: 3.3 mg via INTRAVENOUS
  Administered 2012-12-10: 1.8 mg via INTRAVENOUS
  Filled 2012-12-09: qty 25

## 2012-12-09 MED ORDER — HYDROMORPHONE HCL PF 1 MG/ML IJ SOLN
0.2500 mg | INTRAMUSCULAR | Status: DC | PRN
  Administered 2012-12-09 (×2): 0.5 mg via INTRAVENOUS

## 2012-12-09 MED ORDER — LIDOCAINE HCL (CARDIAC) 20 MG/ML IV SOLN
INTRAVENOUS | Status: DC | PRN
  Administered 2012-12-09: 70 mg via INTRAVENOUS

## 2012-12-09 MED ORDER — MORPHINE SULFATE 4 MG/ML IJ SOLN
4.0000 mg | Freq: Once | INTRAMUSCULAR | Status: AC
  Administered 2012-12-09: 4 mg via INTRAVENOUS
  Filled 2012-12-09: qty 1

## 2012-12-09 MED ORDER — PIPERACILLIN-TAZOBACTAM 3.375 G IVPB
3.3750 g | Freq: Three times a day (TID) | INTRAVENOUS | Status: DC
  Administered 2012-12-09 – 2012-12-16 (×19): 3.375 g via INTRAVENOUS
  Filled 2012-12-09 (×23): qty 50

## 2012-12-09 MED ORDER — PROPOFOL 10 MG/ML IV BOLUS
INTRAVENOUS | Status: DC | PRN
  Administered 2012-12-09: 150 mg via INTRAVENOUS

## 2012-12-09 MED ORDER — ONDANSETRON HCL 4 MG/2ML IJ SOLN: 4.0000 mg | Freq: Four times a day (QID) | INTRAMUSCULAR | Status: DC | PRN

## 2012-12-09 MED ORDER — ALBUMIN HUMAN 5 % IV SOLN
INTRAVENOUS | Status: DC | PRN
  Administered 2012-12-09: 20:00:00 via INTRAVENOUS

## 2012-12-09 MED ORDER — PANTOPRAZOLE SODIUM 40 MG IV SOLR
40.0000 mg | Freq: Every day | INTRAVENOUS | Status: DC
  Administered 2012-12-10 – 2012-12-14 (×5): 40 mg via INTRAVENOUS
  Filled 2012-12-09 (×8): qty 40

## 2012-12-09 MED ORDER — SODIUM CHLORIDE 0.9 % IJ SOLN: 9.0000 mL | INTRAMUSCULAR | Status: DC | PRN

## 2012-12-09 MED ORDER — ONDANSETRON HCL 4 MG/2ML IJ SOLN
4.0000 mg | Freq: Once | INTRAMUSCULAR | Status: AC
  Administered 2012-12-09: 4 mg via INTRAVENOUS
  Filled 2012-12-09: qty 2

## 2012-12-09 MED ORDER — NEOSTIGMINE METHYLSULFATE 1 MG/ML IJ SOLN
INTRAMUSCULAR | Status: DC | PRN
  Administered 2012-12-09: 3 mg via INTRAVENOUS

## 2012-12-09 MED ORDER — HYDROMORPHONE HCL PF 1 MG/ML IJ SOLN
1.0000 mg | Freq: Once | INTRAMUSCULAR | Status: AC
  Administered 2012-12-09: 1 mg via INTRAVENOUS
  Filled 2012-12-09: qty 1

## 2012-12-09 MED ORDER — GLYCOPYRROLATE 0.2 MG/ML IJ SOLN
INTRAMUSCULAR | Status: DC | PRN
  Administered 2012-12-09: 0.2 mg via INTRAVENOUS
  Administered 2012-12-09: 0.4 mg via INTRAVENOUS

## 2012-12-09 MED ORDER — SUCCINYLCHOLINE CHLORIDE 20 MG/ML IJ SOLN
INTRAMUSCULAR | Status: DC | PRN
  Administered 2012-12-09: 100 mg via INTRAVENOUS

## 2012-12-09 MED ORDER — IOHEXOL 300 MG/ML  SOLN: 25.0000 mL | INTRAMUSCULAR | Status: DC

## 2012-12-09 MED ORDER — IOHEXOL 300 MG/ML  SOLN
100.0000 mL | Freq: Once | INTRAMUSCULAR | Status: AC | PRN
  Administered 2012-12-09: 100 mL via INTRAVENOUS

## 2012-12-09 MED ORDER — MIDAZOLAM HCL 5 MG/5ML IJ SOLN
INTRAMUSCULAR | Status: DC | PRN
  Administered 2012-12-09: 2 mg via INTRAVENOUS

## 2012-12-09 MED ORDER — SODIUM CHLORIDE 0.9 % IV SOLN
Freq: Once | INTRAVENOUS | Status: AC
  Administered 2012-12-09: 11:00:00 via INTRAVENOUS

## 2012-12-09 MED ORDER — LACTATED RINGERS IV SOLN
INTRAVENOUS | Status: DC | PRN
  Administered 2012-12-09: 19:00:00 via INTRAVENOUS

## 2012-12-09 MED ORDER — PROMETHAZINE HCL 25 MG/ML IJ SOLN: 6.2500 mg | INTRAMUSCULAR | Status: DC | PRN

## 2012-12-09 SURGICAL SUPPLY — 65 items
APPLIER CLIP 5 13 M/L LIGAMAX5 (MISCELLANEOUS)
BENZOIN TINCTURE PRP APPL 2/3 (GAUZE/BANDAGES/DRESSINGS) IMPLANT
BLADE SURG 10 STRL SS (BLADE) ×8 IMPLANT
BLADE SURG ROTATE 9660 (MISCELLANEOUS) ×4 IMPLANT
CANISTER SUCTION 2500CC (MISCELLANEOUS) ×8 IMPLANT
CHLORAPREP W/TINT 26ML (MISCELLANEOUS) ×4 IMPLANT
CLIP APPLIE 5 13 M/L LIGAMAX5 (MISCELLANEOUS) IMPLANT
CLOTH BEACON ORANGE TIMEOUT ST (SAFETY) ×4 IMPLANT
COVER SURGICAL LIGHT HANDLE (MISCELLANEOUS) ×4 IMPLANT
COVER TRANSDUCER ULTRASND (DRAPES) ×4 IMPLANT
DECANTER SPIKE VIAL GLASS SM (MISCELLANEOUS) IMPLANT
DERMABOND ADVANCED (GAUZE/BANDAGES/DRESSINGS) ×1
DERMABOND ADVANCED .7 DNX12 (GAUZE/BANDAGES/DRESSINGS) ×3 IMPLANT
DEVICE TROCAR PUNCTURE CLOSURE (ENDOMECHANICALS) IMPLANT
DRAIN CHANNEL 19F RND (DRAIN) ×4 IMPLANT
DRAPE UTILITY 15X26 W/TAPE STR (DRAPE) ×8 IMPLANT
DRAPE WARM FLUID 44X44 (DRAPE) ×4 IMPLANT
ELECT BLADE 6.5 EXT (BLADE) ×4 IMPLANT
ELECT CAUTERY BLADE 6.4 (BLADE) ×8 IMPLANT
ELECT REM PT RETURN 9FT ADLT (ELECTROSURGICAL) ×4
ELECTRODE REM PT RTRN 9FT ADLT (ELECTROSURGICAL) ×3 IMPLANT
ENDOLOOP SUT PDS II  0 18 (SUTURE) ×3
ENDOLOOP SUT PDS II 0 18 (SUTURE) ×9 IMPLANT
EVACUATOR SILICONE 100CC (DRAIN) ×4 IMPLANT
GLOVE BIO SURGEON STRL SZ7.5 (GLOVE) ×12 IMPLANT
GLOVE BIOGEL PI IND STRL 6.5 (GLOVE) ×9 IMPLANT
GLOVE BIOGEL PI INDICATOR 6.5 (GLOVE) ×3
GLOVE SURG SS PI 6.0 STRL IVOR (GLOVE) ×12 IMPLANT
GOWN STRL NON-REIN LRG LVL3 (GOWN DISPOSABLE) ×12 IMPLANT
GOWN STRL REIN XL XLG (GOWN DISPOSABLE) ×4 IMPLANT
KIT BASIN OR (CUSTOM PROCEDURE TRAY) ×4 IMPLANT
KIT ROOM TURNOVER OR (KITS) ×4 IMPLANT
LIGASURE IMPACT 36 18CM CVD LR (INSTRUMENTS) ×4 IMPLANT
NEEDLE INSUFFLATION 14GA 120MM (NEEDLE) ×4 IMPLANT
NS IRRIG 1000ML POUR BTL (IV SOLUTION) ×16 IMPLANT
PAD ARMBOARD 7.5X6 YLW CONV (MISCELLANEOUS) ×8 IMPLANT
PENCIL BUTTON HOLSTER BLD 10FT (ELECTRODE) ×4 IMPLANT
RELOAD PROXIMATE 75MM BLUE (ENDOMECHANICALS) ×12 IMPLANT
SCISSORS LAP 5X35 DISP (ENDOMECHANICALS) ×4 IMPLANT
SET IRRIG TUBING LAPAROSCOPIC (IRRIGATION / IRRIGATOR) ×4 IMPLANT
SLEEVE ENDOPATH XCEL 5M (ENDOMECHANICALS) ×8 IMPLANT
SPECIMEN JAR SMALL (MISCELLANEOUS) ×4 IMPLANT
SPONGE GAUZE 4X4 12PLY (GAUZE/BANDAGES/DRESSINGS) ×4 IMPLANT
SPONGE LAP 18X18 X RAY DECT (DISPOSABLE) ×16 IMPLANT
STAPLER PROXIMATE 75MM BLUE (STAPLE) ×4 IMPLANT
STAPLER VISISTAT 35W (STAPLE) ×4 IMPLANT
SUCTION POOLE TIP (SUCTIONS) ×4 IMPLANT
SUT ETHILON 2 0 FS 18 (SUTURE) ×4 IMPLANT
SUT MNCRL AB 3-0 PS2 18 (SUTURE) ×4 IMPLANT
SUT MNCRL AB 4-0 PS2 18 (SUTURE) ×4 IMPLANT
SUT PDS AB 1 TP1 96 (SUTURE) ×8 IMPLANT
SUT SILK 2 0 SH CR/8 (SUTURE) ×4 IMPLANT
SUT VIC AB 1 BRD 54 (SUTURE) IMPLANT
TAPE CLOTH SURG 4X10 WHT LF (GAUZE/BANDAGES/DRESSINGS) ×4 IMPLANT
TOWEL OR 17X24 6PK STRL BLUE (TOWEL DISPOSABLE) ×4 IMPLANT
TOWEL OR 17X26 10 PK STRL BLUE (TOWEL DISPOSABLE) ×4 IMPLANT
TRAY FOLEY CATH 14FR (SET/KITS/TRAYS/PACK) ×4 IMPLANT
TRAY FOLEY CATH 14FRSI W/METER (CATHETERS) ×4 IMPLANT
TRAY LAPAROSCOPIC (CUSTOM PROCEDURE TRAY) ×4 IMPLANT
TROCAR XCEL BLUNT TIP 100MML (ENDOMECHANICALS) ×4 IMPLANT
TROCAR XCEL NON-BLD 11X100MML (ENDOMECHANICALS) ×4 IMPLANT
TROCAR XCEL NON-BLD 5MMX100MML (ENDOMECHANICALS) ×8 IMPLANT
TUBE CONNECTING 12X1/4 (SUCTIONS) ×4 IMPLANT
WATER STERILE IRR 1000ML POUR (IV SOLUTION) ×4 IMPLANT
YANKAUER SUCT BULB TIP NO VENT (SUCTIONS) ×4 IMPLANT

## 2012-12-09 NOTE — ED Notes (Signed)
Pt arrived by [facility] for RLQ pain x 3 months, has seen pcp for it and had colonoscopy and US done. Pt reports increase in pain last night and then had near syncope episode while at work this am. Denies any n/v/d.

## 2012-12-09 NOTE — ED Notes (Signed)
Attempted report x 1. Was told the receiving nurse was busy, and she would call back in 5 minutes.

## 2012-12-09 NOTE — Progress Notes (Signed)
Dr. Jacklynn Bue called informed of B/P in the nineties no orders recieved

## 2012-12-09 NOTE — ED Provider Notes (Signed)
History     CSN: [id number]  Arrival date & time 12/09/12  1012   First MD Initiated Contact with Patient 12/09/12 1013      Chief Complaint  Patient presents with  . Abdominal Pain  . Near Syncope    (Consider location/radiation/quality/duration/timing/severity/associated sxs/prior treatment) Patient is a 62 y.o. female presenting with abdominal pain. The history is provided by the patient.  Abdominal Pain Associated symptoms include abdominal pain.  She has been having pain in her right lower abdomen for the last 2 months and has had outpatient workup including colonoscopy. CT scan has been scheduled. This morning, pain got suddenly worse. There is associated dizziness and she is reported to of past out briefly. She was diaphoretic but denies nausea. Pain is currently rated at 5/10 but was at 10/10. Pain is better when standing, worse when supine. Pain is not affected by eating, moving her bowels, urinating. She describes the pain as sharp but it does wax and wane. Her appetite has been normal. She has had some voluntary weight loss. She denies constipation or diarrhea or urinary difficulty.  Past Medical History  Diagnosis Date  . Anemia     Past Surgical History  Procedure Laterality Date  . Abdominal hysterectomy      Family History  Problem Relation Age of Onset  . Heart disease Mother   . COPD Mother     History  Substance Use Topics  . Smoking status: Former Games developer  . Smokeless tobacco: Never Used  . Alcohol Use: Yes    OB History   Grav Para Term Preterm Abortions TAB SAB Ect Mult Living                  Review of Systems  Gastrointestinal: Positive for abdominal pain.  All other systems reviewed and are negative.    Allergies  Review of patient's allergies indicates no known allergies.  Home Medications   Current Outpatient Rx  Name  Route  Sig  Dispense  Refill  . diphenhydrAMINE (BENADRYL) 25 mg capsule   Oral   Take 25 mg by mouth every 6  (six) hours as needed for itching.         . Multiple Vitamin (MULTIVITAMIN) tablet   Oral   Take 1 tablet by mouth daily.           BP 145/76  Pulse 90  Temp(Src) 97.9 F (36.6 C) (Oral)  Resp 18  SpO2 100%  Physical Exam  Nursing note and vitals reviewed.  62 year old female, resting comfortably and in no acute distress. Vital signs are significant for hypertension with blood pressure 145/76. Oxygen saturation is 100%, which is normal. Head is normocephalic and atraumatic. PERRLA, EOMI. Oropharynx is clear. Neck is nontender and supple without adenopathy or JVD. Back is nontender and there is no CVA tenderness. Lungs are clear without rales, wheezes, or rhonchi. Chest is nontender. Heart has regular rate and rhythm without murmur. Abdomen is soft, flat, with moderate right lower quadrant tenderness. There is no rebound or guarding. There are no masses or hepatosplenomegaly and peristalsis is hypoactive. Extremities have no cyanosis or edema, full range of motion is present. Skin is warm and dry without rash. Neurologic: Mental status is normal, cranial nerves are intact, there are no motor or sensory deficits.  ED Course  Procedures (including critical care time)  Results for orders placed during the hospital encounter of 12/09/12  CBC WITH DIFFERENTIAL      Result Value  Range   WBC 18.2 (*) 4.0 - 10.5 K/uL   RBC 3.76 (*) 3.87 - 5.11 MIL/uL   Hemoglobin 9.9 (*) 12.0 - 15.0 g/dL   HCT 16.1 (*) 09.6 - 04.5 %   MCV 81.6  78.0 - 100.0 fL   MCH 26.3  26.0 - 34.0 pg   MCHC 32.2  30.0 - 36.0 g/dL   RDW 40.9 (*) 81.1 - 91.4 %   Platelets 408 (*) 150 - 400 K/uL   Neutrophils Relative % 88 (*) 43 - 77 %   Neutro Abs 16.1 (*) 1.7 - 7.7 K/uL   Lymphocytes Relative 7 (*) 12 - 46 %   Lymphs Abs 1.2  0.7 - 4.0 K/uL   Monocytes Relative 5  3 - 12 %   Monocytes Absolute 0.9  0.1 - 1.0 K/uL   Eosinophils Relative 0  0 - 5 %   Eosinophils Absolute 0.0  0.0 - 0.7 K/uL    Basophils Relative 0  0 - 1 %   Basophils Absolute 0.0  0.0 - 0.1 K/uL  COMPREHENSIVE METABOLIC PANEL      Result Value Range   Sodium 136  135 - 145 mEq/L   Potassium 4.2  3.5 - 5.1 mEq/L   Chloride 102  96 - 112 mEq/L   CO2 26  19 - 32 mEq/L   Glucose, Bld 123 (*) 70 - 99 mg/dL   BUN 15  6 - 23 mg/dL   Creatinine, Ser 7.82  0.50 - 1.10 mg/dL   Calcium 9.6  8.4 - 95.6 mg/dL   Total Protein 8.1  6.0 - 8.3 g/dL   Albumin 3.1 (*) 3.5 - 5.2 g/dL   AST 16  0 - 37 U/L   ALT 12  0 - 35 U/L   Alkaline Phosphatase 86  39 - 117 U/L   Total Bilirubin 0.3  0.3 - 1.2 mg/dL   GFR calc non Af Amer 61 (*) >90 mL/min   GFR calc Af Amer 71 (*) >90 mL/min  LIPASE, BLOOD      Result Value Range   Lipase 22  11 - 59 U/L  URINALYSIS, ROUTINE W REFLEX MICROSCOPIC      Result Value Range   Color, Urine YELLOW  YELLOW   APPearance CLEAR  CLEAR   Specific Gravity, Urine 1.013  1.005 - 1.030   pH 6.5  5.0 - 8.0   Glucose, UA NEGATIVE  NEGATIVE mg/dL   Hgb urine dipstick NEGATIVE  NEGATIVE   Bilirubin Urine NEGATIVE  NEGATIVE   Ketones, ur NEGATIVE  NEGATIVE mg/dL   Protein, ur NEGATIVE  NEGATIVE mg/dL   Urobilinogen, UA 0.2  0.0 - 1.0 mg/dL   Nitrite NEGATIVE  NEGATIVE   Leukocytes, UA SMALL (*) NEGATIVE  URINE MICROSCOPIC-ADD ON      Result Value Range   Squamous Epithelial / LPF FEW (*) RARE   WBC, UA 3-6  <3 WBC/hpf   Bacteria, UA RARE  RARE   Ct Abdomen Pelvis W Contrast  12/09/2012   *RADIOLOGY REPORT*  Clinical Data: Right lower quadrant pain with diffuse abdominal tenderness.  Symptoms intermittently for 2 months.  CT ABDOMEN AND PELVIS WITH CONTRAST  Technique:  Multidetector CT imaging of the abdomen and pelvis was performed following the standard protocol during bolus administration of intravenous contrast.  Contrast: OMNIPAQUE IOHEXOL 300 MG/ML  SOLN  Comparison: CT abdomen and pelvis 06/11/2011.  Findings: Mild atelectasis is seen in the lung bases.  No pleural  or pericardial  effusion.  There is marked thickening of the walls of the cecum eccentric on the medial side with a mass lesion measuring approximately 5 cm AP by 4.8 cm transverse by 6.0 cm cranial-caudal.  The appendix is markedly dilated with extensive surrounding inflammatory change consistent with appendicitis likely due to obstruction secondary to the patient's cecal mass.  There is a soft tissue density lesion in the right hemi pelvis measuring approximately 5.1 cm AP by 3.6 cm transverse by 5.5 cm cranial-caudal which could be due to intense inflammatory change or could represent metastatic deposit, possibly a nodal mass.  The colon is otherwise unremarkable.  The stomach and small bowel appear normal.  There is a low attenuating lesion in the posterior right hepatic lobe which measures 1.3 cm in diameter and is not clearly seen on the prior study worrisome for metastatic deposit.  The liver otherwise appears normal.  The spleen, adrenal glands, pancreas and right kidney appear normal.  Left renal cyst is again seen as on the prior study.  No focal bony abnormality is identified with degenerative disc disease and facet arthropathy at L5-S1.  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  Low attenuating lesion in the liver not definitely seen on the prior study may represent a metastatic deposit.  Possible large metastatic deposit the right hemi pelvis versus intense inflammatory change as described above.   Original Report Authenticated By: Holley Dexter, M.D.      1. Appendicitis   2. Mass of cecum   3. Anemia       MDM  Right lower quadrant pain of uncertain cause. Old records have been reviewed and she had a colonoscopy which was significant only for diverticulosis. She also had a pelvic ultrasound which was unremarkable. She will be sent for CT scan.  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 Patient is advised of this finding. She has been given IV fluid and IV narcotics but is still in pain and is given additional IV narcotics. Case is discussed with Dr. Andrey Campanile of general surgery who agrees to come and evaluate the patient.      Dione Booze, MD 12/09/12 1432

## 2012-12-09 NOTE — Anesthesia Postprocedure Evaluation (Signed)
  Anesthesia Post-op Note  Patient: Margaret Elliott  Procedure(s) Performed: Procedure(s): LAPAROSCOPY DIAGNOSTIC (N/A)  Right Hemi-colectomy, Resection of Distal Ileum (Right) APPENDECTOMY (N/A)  Patient Location: PACU  Anesthesia Type:General  Level of Consciousness: awake and sedated  Airway and Oxygen Therapy: Patient Spontanous Breathing  Post-op Pain: mild  Post-op Assessment: Post-op Vital signs reviewed  Post-op Vital Signs: stable  Complications: No apparent anesthesia complications

## 2012-12-09 NOTE — Anesthesia Procedure Notes (Signed)
Procedure Name: Intubation Date/Time: 12/09/2012 7:12 PM Performed by: Jerilee Hoh Pre-anesthesia Checklist: Patient identified, Emergency Drugs available, Suction available and Patient being monitored Patient Re-evaluated:Patient Re-evaluated prior to inductionOxygen Delivery Method: Circle system utilized Preoxygenation: Pre-oxygenation with 100% oxygen Intubation Type: IV induction Ventilation: Mask ventilation without difficulty Laryngoscope Size: Mac and 3 Grade View: Grade II Tube type: Oral Tube size: 7.5 mm Number of attempts: 1 Airway Equipment and Method: Stylet Placement Confirmation: ETT inserted through vocal cords under direct vision,  positive ETCO2 and breath sounds checked- equal and bilateral Secured at: 22 cm Tube secured with: Tape Dental Injury: Teeth and Oropharynx as per pre-operative assessment

## 2012-12-09 NOTE — Progress Notes (Signed)
DR. Derrell Lolling called and wants NGT to low interrmittant suction

## 2012-12-09 NOTE — Progress Notes (Signed)
PCA set up.

## 2012-12-09 NOTE — Transfer of Care (Signed)
Immediate Anesthesia Transfer of Care Note  Patient: Margaret Elliott  Procedure(s) Performed: Procedure(s): LAPAROSCOPY DIAGNOSTIC (N/A)  Right Hemi-colectomy, Resection of Distal Ileum (Right) APPENDECTOMY (N/A)  Patient Location: PACU  Anesthesia Type:General  Level of Consciousness: awake, alert , oriented and patient cooperative  Airway & Oxygen Therapy: Patient Spontanous Breathing and Patient connected to face mask oxygen  Post-op Assessment: Report given to PACU RN, Post -op Vital signs reviewed and stable and Patient moving all extremities  Post vital signs: Reviewed and stable  Complications: No apparent anesthesia complications

## 2012-12-09 NOTE — Progress Notes (Signed)
Chest xray down NGT in stomach, IJ in correct postion

## 2012-12-09 NOTE — ED Notes (Signed)
This RN called CT to let them know that the pt has finished her first cup of oral contrast.

## 2012-12-09 NOTE — Op Note (Signed)
Pre Operative Diagnosis:  Appendicitis, cecal mass, pelvic fluid collection  Post Operative Diagnosis: same, and pelvic abscess  Procedure:diagnostic laparoscopy, exporter laparotomy, evacuation of pelvic abscess, right hemicolectomy with ileocolonic anastomosis  Surgeon: Dr. Axel Filler  Assistant: none  Anesthesia: GETA  EBL: 25 cc  Complications: none  Counts: reported as correct x 2  Findings:  The patient had a small periappendiceal abscess collection. Patient also had a mass in her cecum as well as surrounding small bowel inflammation. The abscess was localized and irrigated out. There were some bulky lymphadenopathy near the right cecum.  Her ileocolonic anastomosis was tension-free. There was an area in the mid jejunum that had some associated inflammation, however there was no dilated proximal small bowel.  Indications for procedure:  The patient is a 62 year old female with a two-month history of on and off abdominal pain localized to the right lower quadrant. Patient underwent CT scan today which showed likely appendicitis and a cecal mass. This patient was taken back urgently for support her laparotomy and resection of the mass.  Details of the procedure:The patient was taken back to the operating room. The patient was placed in supine position with bilateral SCDs in place. After appropriate anitbiotics were confirmed, a time-out was confirmed and all facts were verified.  The Veress needle technique was used to insufflate the abdomen and the left subcostal margin. Subsequent to this and after 14 mmHg was obtained a 5 mm trocar was then placed as was a 5 mm camera. There was no injury to any intra-abdominal organ. A second 5 mm trocar was in place the left lower quadrant under direct visualization. At this point I visualized the right lower quadrant which was firm and could not be mobilized. At this point we converted to an exploratory laparotomy via midline incision. Pelvic  gutters used to maintain hemostasis after a template was used to make an incision. Dissection was carried anterior fascia and the peritoneum was exposed and entered. Upon initial exploration it was noted to be a large mass to the right lower quadrant which was covered by surrounding small bowel. This was finger fractured away from the cecum and the appendix. The appendix was noted to be attached to the right pelvic wall and there was any surrounding abscess. This was irrigated out. The appendix was then finger fractured away from the surrounding tissue. There was an area of small bowel that was adhered to the pelvic wall this was also finger fractured away from the surrounding tissue. At this point the cecum and the proximal right colon was immobilized by incising the white line of Toldt and mobilizing the mesentery medially. The small bowel and the mesoappendix was then mobilized off of the retroperitoneum. This completely mobilized the appendix in the right cecum. It was noted to be large firm mass with what seemed to be coming from the right cecum.  At this time I ran the small bowel from ligament of Treitz to terminal ileum. There is a second area of inflamed small bowel in the mid jejunal area. There was no proximal distended bowel, and this was left alone. At this time the entire right colon was then mobilized distally to the splenic flexure. The right colon mesentery was then dissected away from the duodenum and ensure this fashion. At this time I picked an area proximal to the inflammation of the terminal ileum and transected the terminal ileum with a 75 GIA stapler. At this time the LigaSure was used to harvest the mesentery  at the root of her splenic flexure. Area of the splenic flexure of the colon was then chosen and seen to be healthy and we mass, greater than 6 cm to be transected. A second load of the GIA 75 stapler was then used to transect the colon. The remaining mesentery was incised usingthe  LigaSure. This was sent off to pathology. At this point the ileum and the colon were reapproximated and a staple anastomosis  Was undertaken in the standard fashion. The bowel was patent at this point. A crotch stitch was then placed with a 3-0 silk. The mesentery was then reapproximated using figure-of-eight 3-0 silk. The anastomosis lay without undue tension and without twists. At this point we reran the small bowel from the ligament of Treitz to the anastomosis and there were no new findings. The abdomen was irrigated out with 3 L of sterile saline. At this point the abdomen and fascia was closed using a #1 PDS in a running standard fashion. The skin was then reapproximated loosely with staples. Trocar sites were reapproximated using staples. The skin was then dressed with 4 x 4's and tape. The patient was taken to the recovery room in stable condition.

## 2012-12-09 NOTE — H&P (Signed)
I saw the patient, participated in the history, exam and medical decision making, and concur with the physician assistant's note above.  Intermittent RLQ pain x 2months. No pattern. May last days and then spontaneously. No unintentional wt loss. doesnot radiate. Nothing makes it better when occurs. No bowel changes. No melena/hematochezia. No n/v.  Saw PCP and ordered transvag u/s which was negative Saw dr Arlyce Dice for colonoscopy about 2 weeks ago and anemia - diverticular dis only (reviewed cscopy myself)  Pt in NAD, not ill appearing Normal vitals cta  Reg Soft, not really distended. Old transverse incision; localized peritonitis in RLQ No edema. Non-focal neuro  Ct and labs reviewed Ct reviewed with radiologist  RLQ pain  Leukcytosis Anemia Liver lesion Acute appendicitis Radiologically abnormal cecum Soft tissue density in pelvis  Very atypical presentation Radiologically this looks like a malignancy if you take the liver lesion, cecal appearance and pelvis findings.  However pt had clean colonoscopy only 2 weeks. On CT, the area of the cecum that appears most abnormal is the wall - not intraluminal  diffl  Chronic appendicitis with surrounding inflammation/phlegmon Malignant process causing secondary appendicitis  Regardless of etiology, pt has localized peritonitis and in my opinion needs exp lap, ileocecectomy vs right hemicolectomy, possible ostomy depending on OR findings. Because of the concern for malignancy, I would not treat this as ruptured appendicitis and treat with IV abx and perc drain.  Currently, pt is stable, afebrile, non-toxic appearing, not tachycardiac, not acidotic. I will discuss the case with my partner is on-call, Dr Derrell Lolling.   diffl discussed with pt and husband. Explained my reasoning for rec surgery. Briefly discussed risk/benefits/alt treatments. Will let Dr Derrell Lolling do full informed consent since he will be operative surgeon.   Mary Sella. Andrey Campanile, MD,  FACS General, Bariatric, & Minimally Invasive Surgery [facility], [place]

## 2012-12-09 NOTE — Progress Notes (Signed)
Pt given PCA button, educated on use of PCa ,pt sleepy will reinforce

## 2012-12-09 NOTE — Preoperative (Signed)
Beta Blockers   Reason not to administer Beta Blockers:Not Applicable 

## 2012-12-09 NOTE — H&P (Signed)
Margaret Elliott [birth year]  [id number].   Primary Care MD: Dr. Beverely Low Chief Complaint/Reason for Consult: RLQ abdominal pain HPI: This is a 62 yo black female who had some right sided abdominal pain 2 years ago, at which time she had a CT scan of her abdomen that was negative.  Her pain went away until this April.  She has been evaluated by her PCP several times and found to have anemia.  She just had a colonoscopy 2 weeks ago from today that was clean and did not reveal any findings to explain her RLQ abdominal pain.  She denies hematochezia or change in bowel habits, weight loss, fevers, chills, or night sweats.  Her appetite has been normal.  This week her pain began to worsen.  It's a colicky type pain.  It hurts worse when she lays down.  Today she was at work for and apparently passed out.  She was brought here to the [facility].  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  Low attenuating lesion in the liver not definitely seen on the prior study may represent a metastatic deposit.  Possible large metastatic deposit the right hemi pelvis versus intense inflammatory change.  We have been asked to see her for further evaluation.  Review of Systems: Please see HPI, otherwise all other systems are negative except for some slight dysuria the past 2 days.  Family History  Problem Relation Age of Onset  . Heart disease Mother   . COPD Mother     Past Medical History  Diagnosis Date  . Anemia     Past Surgical History  Procedure Laterality Date  . Abdominal hysterectomy    . Ganglion cyst excision      Social History:  reports that she has quit smoking. She has never used smokeless tobacco. She reports that  drinks alcohol. She reports that she does not use illicit drugs.  Allergies: No Known Allergies   (Not in a hospital admission)  Blood pressure 136/77, pulse 97, temperature 97.9 F  (36.6 C), temperature source Oral, resp. rate 18, SpO2 97.00%. Physical Exam: General: pleasant, WD, WN black female who is laying in bed in NAD HEENT: head is normocephalic, atraumatic.  Sclera are noninjected.  PERRL.  Ears and nose without any masses or lesions.  Mouth is pink and moist Heart: regular, rate, and rhythm.  Normal s1,s2. No obvious murmurs, gallops, or rubs noted.  Palpable radial and pedal pulses bilaterally Lungs: CTAB, no wheezes, rhonchi, or rales noted.  Respiratory effort nonlabored Abd: soft, focally tender in RLQ with voluntary guarding, ND, hypoactive BS, no palpable masses, hernias, or organomegaly, pfannenstiel scar noted  MS: all 4 extremities are symmetrical with no cyanosis, clubbing, or edema. Skin: warm and dry with no masses, lesions, or rashes Psych: A&Ox3 with an appropriate affect.    Results for orders placed during the hospital encounter of 12/09/12 (from the past 48 hour(s))  CBC WITH DIFFERENTIAL     Status: Abnormal   Collection Time    12/09/12 10:45 AM      Result Value Range   WBC 18.2 (*) 4.0 - 10.5 K/uL   RBC 3.76 (*) 3.87 - 5.11 MIL/uL   Hemoglobin 9.9 (*) 12.0 - 15.0 g/dL   HCT 69.6 (*) 29.5 - 28.4 %   MCV 81.6  78.0 - 100.0 fL   MCH 26.3  26.0 - 34.0 pg  MCHC 32.2  30.0 - 36.0 g/dL   RDW 16.1 (*) 09.6 - 04.5 %   Platelets 408 (*) 150 - 400 K/uL   Neutrophils Relative % 88 (*) 43 - 77 %   Neutro Abs 16.1 (*) 1.7 - 7.7 K/uL   Lymphocytes Relative 7 (*) 12 - 46 %   Lymphs Abs 1.2  0.7 - 4.0 K/uL   Monocytes Relative 5  3 - 12 %   Monocytes Absolute 0.9  0.1 - 1.0 K/uL   Eosinophils Relative 0  0 - 5 %   Eosinophils Absolute 0.0  0.0 - 0.7 K/uL   Basophils Relative 0  0 - 1 %   Basophils Absolute 0.0  0.0 - 0.1 K/uL  COMPREHENSIVE METABOLIC PANEL     Status: Abnormal   Collection Time    12/09/12 10:45 AM      Result Value Range   Sodium 136  135 - 145 mEq/L   Potassium 4.2  3.5 - 5.1 mEq/L   Chloride 102  96 - 112 mEq/L    CO2 26  19 - 32 mEq/L   Glucose, Bld 123 (*) 70 - 99 mg/dL   BUN 15  6 - 23 mg/dL   Creatinine, Ser 4.09  0.50 - 1.10 mg/dL   Calcium 9.6  8.4 - 81.1 mg/dL   Total Protein 8.1  6.0 - 8.3 g/dL   Albumin 3.1 (*) 3.5 - 5.2 g/dL   AST 16  0 - 37 U/L   ALT 12  0 - 35 U/L   Alkaline Phosphatase 86  39 - 117 U/L   Total Bilirubin 0.3  0.3 - 1.2 mg/dL   GFR calc non Af Amer 61 (*) >90 mL/min   GFR calc Af Amer 71 (*) >90 mL/min   Comment:            The eGFR has been calculated     using the CKD EPI equation.     This calculation has not been     validated in all clinical     situations.     eGFR's persistently     <90 mL/min signify     possible Chronic Kidney Disease.  LIPASE, BLOOD     Status: None   Collection Time    12/09/12 10:45 AM      Result Value Range   Lipase 22  11 - 59 U/L  URINALYSIS, ROUTINE W REFLEX MICROSCOPIC     Status: Abnormal   Collection Time    12/09/12 12:11 PM      Result Value Range   Color, Urine YELLOW  YELLOW   APPearance CLEAR  CLEAR   Specific Gravity, Urine 1.013  1.005 - 1.030   pH 6.5  5.0 - 8.0   Glucose, UA NEGATIVE  NEGATIVE mg/dL   Hgb urine dipstick NEGATIVE  NEGATIVE   Bilirubin Urine NEGATIVE  NEGATIVE   Ketones, ur NEGATIVE  NEGATIVE mg/dL   Protein, ur NEGATIVE  NEGATIVE mg/dL   Urobilinogen, UA 0.2  0.0 - 1.0 mg/dL   Nitrite NEGATIVE  NEGATIVE   Leukocytes, UA SMALL (*) NEGATIVE  URINE MICROSCOPIC-ADD ON     Status: Abnormal   Collection Time    12/09/12 12:11 PM      Result Value Range   Squamous Epithelial / LPF FEW (*) RARE   WBC, UA 3-6  <3 WBC/hpf   Bacteria, UA RARE  RARE   Ct Abdomen Pelvis W Contrast  12/09/2012   *  RADIOLOGY REPORT*  Clinical Data: Right lower quadrant pain with diffuse abdominal tenderness.  Symptoms intermittently for 2 months.  CT ABDOMEN AND PELVIS WITH CONTRAST  Technique:  Multidetector CT imaging of the abdomen and pelvis was performed following the standard protocol during bolus administration  of intravenous contrast.  Contrast: OMNIPAQUE IOHEXOL 300 MG/ML  SOLN  Comparison: CT abdomen and pelvis 06/11/2011.  Findings: Mild atelectasis is seen in the lung bases.  No pleural or pericardial effusion.  There is marked thickening of the walls of the cecum eccentric on the medial side with a mass lesion measuring approximately 5 cm AP by 4.8 cm transverse by 6.0 cm cranial-caudal.  The appendix is markedly dilated with extensive surrounding inflammatory change consistent with appendicitis likely due to obstruction secondary to the patient's cecal mass.  There is a soft tissue density lesion in the right hemi pelvis measuring approximately 5.1 cm AP by 3.6 cm transverse by 5.5 cm cranial-caudal which could be due to intense inflammatory change or could represent metastatic deposit, possibly a nodal mass.  The colon is otherwise unremarkable.  The stomach and small bowel appear normal.  There is a low attenuating lesion in the posterior right hepatic lobe which measures 1.3 cm in diameter and is not clearly seen on the prior study worrisome for metastatic deposit.  The liver otherwise appears normal.  The spleen, adrenal glands, pancreas and right kidney appear normal.  Left renal cyst is again seen as on the prior study.  No focal bony abnormality is identified with degenerative disc disease and facet arthropathy at L5-S1.  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  Low attenuating lesion in the liver not definitely seen on the prior study may represent a metastatic deposit.  Possible large metastatic deposit the right hemi pelvis versus intense inflammatory change as described above.   Original Report Authenticated By: Holley Dexter, M.D.       Assessment/Plan 1. RLQ abdominal pain, acute appendicitis which appears secondary to possible cecal mass vs chronic appendicitis 2. liver lesion 3. Pelvic mass vs  inflammatory process 4. Fe deficiency anemia 5. Syncopal episode at work  Plan: 1. We will get the patient admitted. She will likely require surgical intervention, possibly tonight.  She may need a formal right colectomy given the possibility that this is a carcinoma.  She will be placed on IV zosyn, fluids, pains meds, etc. Dr. Andrey Campanile and I have thoroughly explained all of this to her.  Her CT scan is not definite whether this is a chronic appendicitis vs carcinoma.  She understands all of this, as well as her husband, and is agreeable to proceed.  Will follow anemia.  Npo.   Letha Cape 12/09/2012, 3:40 PM Pager: [phone]

## 2012-12-09 NOTE — ED Notes (Signed)
This RN called OR and asked if there had been a time assigned for the pt's surgery, and was told that there was no scheduled time. The CRNA that this RN spoke with said it would be ok to transport the pt upstairs to her room.

## 2012-12-09 NOTE — Anesthesia Preprocedure Evaluation (Signed)
Anesthesia Evaluation  Patient identified by MRN, date of birth, ID band Patient awake    Reviewed: Allergy & Precautions, H&P , NPO status , Patient's Chart, lab work & pertinent test results  History of Anesthesia Complications Negative for: history of anesthetic complications  Airway Mallampati: II  Neck ROM: Full    Dental  (+) Teeth Intact   Pulmonary neg pulmonary ROS,  breath sounds clear to auscultation        Cardiovascular negative cardio ROS  Rhythm:Regular Rate:Tachycardia     Neuro/Psych negative neurological ROS     GI/Hepatic Neg liver ROS,   Endo/Other  negative endocrine ROS  Renal/GU negative Renal ROS     Musculoskeletal negative musculoskeletal ROS (+)   Abdominal   Peds  Hematology  (+) Blood dyscrasia, anemia ,   Anesthesia Other Findings   Reproductive/Obstetrics negative OB ROS                           Anesthesia Physical Anesthesia Plan  ASA: II and emergent  Anesthesia Plan: General   Post-op Pain Management:    Induction: Intravenous  Airway Management Planned: Oral ETT  Additional Equipment: CVP  Intra-op Plan:   Post-operative Plan: Extubation in OR  Informed Consent: I have reviewed the patients History and Physical, chart, labs and discussed the procedure including the risks, benefits and alternatives for the proposed anesthesia with the patient or authorized representative who has indicated his/her understanding and acceptance.   Dental advisory given  Plan Discussed with: CRNA and Surgeon  Anesthesia Plan Comments:         Anesthesia Quick Evaluation

## 2012-12-09 NOTE — ED Notes (Signed)
Osbourne, MD from general surgery at bedside.

## 2012-12-10 LAB — CEA: CEA: 1 ng/mL (ref 0.0–5.0)

## 2012-12-10 LAB — CBC
Hemoglobin: 9 g/dL — ABNORMAL LOW (ref 12.0–15.0)
MCHC: 32 g/dL (ref 30.0–36.0)
RBC: 3.38 MIL/uL — ABNORMAL LOW (ref 3.87–5.11)
WBC: 19.7 10*3/uL — ABNORMAL HIGH (ref 4.0–10.5)

## 2012-12-10 LAB — BASIC METABOLIC PANEL
BUN: 15 mg/dL (ref 6–23)
GFR calc Af Amer: 43 mL/min — ABNORMAL LOW (ref >90)
GFR calc non Af Amer: 37 mL/min — ABNORMAL LOW (ref >90)
Potassium: 4.2 mEq/L (ref 3.5–5.1)
Sodium: 135 mEq/L (ref 135–145)

## 2012-12-10 MED ORDER — LACTATED RINGERS IV BOLUS (SEPSIS): 1000.0000 mL | Freq: Once | INTRAVENOUS | Status: DC

## 2012-12-10 MED ORDER — SODIUM CHLORIDE 0.9 % IJ SOLN
9.0000 mL | INTRAMUSCULAR | Status: DC | PRN
  Administered 2012-12-10: 9 mL via INTRAVENOUS

## 2012-12-10 MED ORDER — NALOXONE HCL 0.4 MG/ML IJ SOLN: 0.4000 mg | INTRAMUSCULAR | Status: DC | PRN

## 2012-12-10 MED ORDER — DIPHENHYDRAMINE HCL 12.5 MG/5ML PO ELIX: 12.5000 mg | ORAL_SOLUTION | Freq: Four times a day (QID) | ORAL | Status: DC | PRN

## 2012-12-10 MED ORDER — ONDANSETRON HCL 4 MG/2ML IJ SOLN
4.0000 mg | Freq: Four times a day (QID) | INTRAMUSCULAR | Status: DC | PRN
  Administered 2012-12-15 – 2012-12-16 (×3): 4 mg via INTRAVENOUS
  Filled 2012-12-10 (×3): qty 2

## 2012-12-10 MED ORDER — MORPHINE SULFATE (PF) 1 MG/ML IV SOLN
INTRAVENOUS | Status: DC
  Administered 2012-12-10: 7 mg via INTRAVENOUS
  Administered 2012-12-10 – 2012-12-11 (×2): via INTRAVENOUS
  Administered 2012-12-11: 6 mg via INTRAVENOUS
  Administered 2012-12-11: 16:00:00 via INTRAVENOUS
  Administered 2012-12-11: 15 mg via INTRAVENOUS
  Administered 2012-12-11: 4.9 mg via INTRAVENOUS
  Administered 2012-12-11: 5 mg via INTRAVENOUS
  Administered 2012-12-12: 6.37 mg via INTRAVENOUS
  Administered 2012-12-12: 4 mg via INTRAVENOUS
  Administered 2012-12-12: 8 mg via INTRAVENOUS
  Administered 2012-12-12: 12:00:00 via INTRAVENOUS
  Administered 2012-12-12: 2 mg via INTRAVENOUS
  Administered 2012-12-13: 12 mg via INTRAVENOUS
  Administered 2012-12-13: 2.89 mg via INTRAVENOUS
  Filled 2012-12-10 (×6): qty 25

## 2012-12-10 MED ORDER — DIPHENHYDRAMINE HCL 50 MG/ML IJ SOLN: 12.5000 mg | Freq: Four times a day (QID) | INTRAMUSCULAR | Status: DC | PRN

## 2012-12-10 NOTE — Progress Notes (Signed)
Patient's blood pressure at 0400 was 82/52, pulse 105.urine in Foley 60cc. Pt alert and oriented in no distress.  Notified Dr. Derrell Lolling of pt's low BP and low urine output.  Order placed in chart.

## 2012-12-10 NOTE — Progress Notes (Signed)
1 Day Post-Op  Subjective: Pt doing well this AM.  Some low BP overnight and decreased UOP.  Pt was bolused with 2L overnight.  Pt pain controlled and sleepy.  Objective: Vital signs in last 24 hours: Temp:  [97.9 F (36.6 C)-99.3 F (37.4 C)] 98.1 F (36.7 C) (06/21 0555) Pulse Rate:  [76-113] 109 (06/21 0704) Resp:  [9-25] 9 (06/21 0726) BP: (74-145)/(45-77) 96/55 mmHg (06/21 0704) SpO2:  [94 %-100 %] 95 % (06/21 0726) FiO2 (%):  [97 %] 97 % (06/20 2206) Last BM Date: 12/08/12  Intake/Output from previous day: 06/20 0701 - 06/21 0700 In: 2797.9 [I.V.:1547.9; IV Piggyback:1250] Out: 485 [Urine:435; Blood:50] Intake/Output this shift:    General appearance: alert and cooperative Abdomen: soft, approp TTP, ND Lab Results:   Recent Labs  12/09/12 1045 12/10/12 0510  WBC 18.2* 19.7*  HGB 9.9* 9.0*  HCT 30.7* 28.1*  PLT 408* 407*   BMET  Recent Labs  12/09/12 1045  NA 136  K 4.2  CL 102  CO2 26  GLUCOSE 123*  BUN 15  CREATININE 0.97  CALCIUM 9.6   PT/INR No results found for this basename: LABPROT, INR,  in the last 72 hours ABG No results found for this basename: PHART, PCO2, PO2, HCO3,  in the last 72 hours  Studies/Results: Ct Abdomen Pelvis W Contrast  12/09/2012   *RADIOLOGY REPORT*  Clinical Data: Right lower quadrant pain with diffuse abdominal tenderness.  Symptoms intermittently for 2 months.  CT ABDOMEN AND PELVIS WITH CONTRAST  Technique:  Multidetector CT imaging of the abdomen and pelvis was performed following the standard protocol during bolus administration of intravenous contrast.  Contrast: OMNIPAQUE IOHEXOL 300 MG/ML  SOLN  Comparison: CT abdomen and pelvis 06/11/2011.  Findings: Mild atelectasis is seen in the lung bases.  No pleural or pericardial effusion.  There is marked thickening of the walls of the cecum eccentric on the medial side with a mass lesion measuring approximately 5 cm AP by 4.8 cm transverse by 6.0 cm cranial-caudal.   The appendix is markedly dilated with extensive surrounding inflammatory change consistent with appendicitis likely due to obstruction secondary to the patient's cecal mass.  There is a soft tissue density lesion in the right hemi pelvis measuring approximately 5.1 cm AP by 3.6 cm transverse by 5.5 cm cranial-caudal which could be due to intense inflammatory change or could represent metastatic deposit, possibly a nodal mass.  The colon is otherwise unremarkable.  The stomach and small bowel appear normal.  There is a low attenuating lesion in the posterior right hepatic lobe which measures 1.3 cm in diameter and is not clearly seen on the prior study worrisome for metastatic deposit.  The liver otherwise appears normal.  The spleen, adrenal glands, pancreas and right kidney appear normal.  Left renal cyst is again seen as on the prior study.  No focal bony abnormality is identified with degenerative disc disease and facet arthropathy at L5-S1.  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  Low attenuating lesion in the liver not definitely seen on the prior study may represent a metastatic deposit.  Possible large metastatic deposit the right hemi pelvis versus intense inflammatory change as described above.   Original Report Authenticated By: Holley Dexter, M.D.   Dg Chest Port 1 View  12/09/2012   *RADIOLOGY REPORT*  Clinical Data: Central line placement.  PORTABLE CHEST - 1 VIEW  Comparison: 12/09/2012.  Findings:  The right IJ catheter tip is in the mid SVC just above the level of the carina.  The NG tube is coursing down the esophagus and into the stomach.  The cardiac silhouette, mediastinal and hilar contours are normal and stable.  Low lung volumes with minimal streaky basilar atelectasis.  Contrast is noted in the colon from recent CT scan.  IMPRESSION:  1.  Right IJ catheter in good position without complicating  features. 2.  NG tube is in the stomach. 3.  Low lung volumes with streaky basilar atelectasis.   Original Report Authenticated By: Rudie Meyer, M.D.   Dg Chest Port 1 View  12/09/2012   *RADIOLOGY REPORT*  Clinical Data: Preop clearance.  Appendectomy.  PORTABLE CHEST - 1 VIEW  Comparison: None  Findings: Heart size is normal.  There is no pleural effusion.  No airspace consolidation identified.  Review of the visualized osseous structures is unremarkable.  IMPRESSION:  1.  No acute cardiopulmonary abnormalities.   Original Report Authenticated By: Signa Kell, M.D.    Anti-infectives: Anti-infectives   Start     Dose/Rate Route Frequency Ordered Stop   12/09/12 1745  piperacillin-tazobactam (ZOSYN) IVPB 3.375 g     3.375 g 12.5 mL/hr over 240 Minutes Intravenous 3 times per day 12/09/12 1740        Assessment/Plan: s/p Procedure(s): LAPAROSCOPY DIAGNOSTIC (N/A)  Right Hemi-colectomy, Resection of Distal Ileum (Right) APPENDECTOMY (N/A) Continue ABX therapy due to Post-op infection Continue foley due to urinary output monitoring PT likely behind in her fluid.  Con't to monitor UOP.   Hct appropriate after surgery. Con't to monitor UOP/BP. Out of bed in chair and ambulate as possible. con't NGT  LOS: 1 day    Marigene Ehlers., [facility] 12/10/2012

## 2012-12-11 MED ORDER — SODIUM CHLORIDE 0.9 % IJ SOLN
10.0000 mL | INTRAMUSCULAR | Status: DC | PRN
  Administered 2012-12-11 (×2): 10 mL

## 2012-12-11 MED ORDER — PHENOL 1.4 % MT LIQD
1.0000 | OROMUCOSAL | Status: DC | PRN
  Administered 2012-12-11: 1 via OROMUCOSAL
  Filled 2012-12-11: qty 177

## 2012-12-11 MED ORDER — HEPARIN SODIUM (PORCINE) 5000 UNIT/ML IJ SOLN
5000.0000 [IU] | Freq: Three times a day (TID) | INTRAMUSCULAR | Status: DC
  Administered 2012-12-11 – 2012-12-15 (×12): 5000 [IU] via SUBCUTANEOUS
  Filled 2012-12-11 (×17): qty 1

## 2012-12-11 NOTE — Progress Notes (Signed)
Patient ID: Margaret Elliott, female   DOB: 19-Feb-1951, 62 y.o.   MRN: [id number] 2 Days Post-Op  Subjective: Good pain control. Up out of bed. No major complaints.  Objective: Vital signs in last 24 hours: Temp:  [97.4 F (36.3 C)-98.4 F (36.9 C)] 97.9 F (36.6 C) (06/22 0535) Pulse Rate:  [82-94] 85 (06/22 0535) Resp:  [13-22] 17 (06/22 0821) BP: (86-113)/(50-59) 106/59 mmHg (06/22 0535) SpO2:  [93 %-98 %] 98 % (06/22 0821) FiO2 (%):  [92 %-96 %] 92 % (06/21 1643) Last BM Date: 12/08/12  Intake/Output from previous day: 06/21 0701 - 06/22 0700 In: 3886.1 [I.V.:3886.1] Out: 1600 [Urine:1025; Emesis/NG output:575] Intake/Output this shift:    General appearance: alert, cooperative and no distress GI: minimal appropriate tenderness around the incision. Soft and nondistended. Incision/Wound: dressing clean and dry.  Lab Results:   Recent Labs  12/09/12 1045 12/10/12 0510  WBC 18.2* 19.7*  HGB 9.9* 9.0*  HCT 30.7* 28.1*  PLT 408* 407*   BMET  Recent Labs  12/09/12 1045 12/10/12 0510  NA 136 135  K 4.2 4.2  CL 102 102  CO2 26 24  GLUCOSE 123* 162*  BUN 15 15  CREATININE 0.97 1.48*  CALCIUM 9.6 8.2*      Studies/Results: Ct Abdomen Pelvis W Contrast  12/09/2012   *RADIOLOGY REPORT*  Clinical Data: Right lower quadrant pain with diffuse abdominal tenderness.  Symptoms intermittently for 2 months.  CT ABDOMEN AND PELVIS WITH CONTRAST  Technique:  Multidetector CT imaging of the abdomen and pelvis was performed following the standard protocol during bolus administration of intravenous contrast.  Contrast: OMNIPAQUE IOHEXOL 300 MG/ML  SOLN  Comparison: CT abdomen and pelvis 06/11/2011.  Findings: Mild atelectasis is seen in the lung bases.  No pleural or pericardial effusion.  There is marked thickening of the walls of the cecum eccentric on the medial side with a mass lesion measuring approximately 5 cm AP by 4.8 cm transverse by 6.0 cm cranial-caudal.  The  appendix is markedly dilated with extensive surrounding inflammatory change consistent with appendicitis likely due to obstruction secondary to the patient's cecal mass.  There is a soft tissue density lesion in the right hemi pelvis measuring approximately 5.1 cm AP by 3.6 cm transverse by 5.5 cm cranial-caudal which could be due to intense inflammatory change or could represent metastatic deposit, possibly a nodal mass.  The colon is otherwise unremarkable.  The stomach and small bowel appear normal.  There is a low attenuating lesion in the posterior right hepatic lobe which measures 1.3 cm in diameter and is not clearly seen on the prior study worrisome for metastatic deposit.  The liver otherwise appears normal.  The spleen, adrenal glands, pancreas and right kidney appear normal.  Left renal cyst is again seen as on the prior study.  No focal bony abnormality is identified with degenerative disc disease and facet arthropathy at L5-S1.  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  Low attenuating lesion in the liver not definitely seen on the prior study may represent a metastatic deposit.  Possible large metastatic deposit the right hemi pelvis versus intense inflammatory change as described above.   Original Report Authenticated By: Holley Dexter, M.D.   Dg Chest Port 1 View  12/09/2012   *RADIOLOGY REPORT*  Clinical Data: Central line placement.  PORTABLE CHEST - 1 VIEW  Comparison: 12/09/2012.  Findings: The right IJ catheter tip is in  the mid SVC just above the level of the carina.  The NG tube is coursing down the esophagus and into the stomach.  The cardiac silhouette, mediastinal and hilar contours are normal and stable.  Low lung volumes with minimal streaky basilar atelectasis.  Contrast is noted in the colon from recent CT scan.  IMPRESSION:  1.  Right IJ catheter in good position without complicating features.  2.  NG tube is in the stomach. 3.  Low lung volumes with streaky basilar atelectasis.   Original Report Authenticated By: Rudie Meyer, M.D.   Dg Chest Port 1 View  12/09/2012   *RADIOLOGY REPORT*  Clinical Data: Preop clearance.  Appendectomy.  PORTABLE CHEST - 1 VIEW  Comparison: None  Findings: Heart size is normal.  There is no pleural effusion.  No airspace consolidation identified.  Review of the visualized osseous structures is unremarkable.  IMPRESSION:  1.  No acute cardiopulmonary abnormalities.   Original Report Authenticated By: Signa Kell, M.D.    Anti-infectives: Anti-infectives   Start     Dose/Rate Route Frequency Ordered Stop   12/09/12 1745  piperacillin-tazobactam (ZOSYN) IVPB 3.375 g     3.375 g 12.5 mL/hr over 240 Minutes Intravenous 3 times per day 12/09/12 1740        Assessment/Plan: s/p Procedure(s): LAPAROSCOPY DIAGNOSTIC  Right Hemi-colectomy, Resection of Distal Ileum APPENDECTOMY Stable postop. Continue NG for now. Continue antibiotics. Repeat CBC tomorrow. DC Foley.   LOS: 2 days    Halima Fogal T 12/11/2012

## 2012-12-11 NOTE — Progress Notes (Signed)
Utilization Review Completed.Margaret Elliott T6/22/2014  

## 2012-12-12 LAB — CBC
MCH: 25.9 pg — ABNORMAL LOW (ref 26.0–34.0)
MCHC: 31.5 g/dL (ref 30.0–36.0)
Platelets: 338 10*3/uL (ref 150–400)
RDW: 16.1 % — ABNORMAL HIGH (ref 11.5–15.5)

## 2012-12-12 LAB — BASIC METABOLIC PANEL
Calcium: 8.3 mg/dL — ABNORMAL LOW (ref 8.4–10.5)
GFR calc non Af Amer: 68 mL/min — ABNORMAL LOW (ref >90)
Sodium: 135 mEq/L (ref 135–145)

## 2012-12-12 MED ORDER — CHLORHEXIDINE GLUCONATE 0.12 % MT SOLN
15.0000 mL | Freq: Two times a day (BID) | OROMUCOSAL | Status: DC
  Administered 2012-12-12 – 2012-12-15 (×7): 15 mL via OROMUCOSAL
  Filled 2012-12-12 (×6): qty 15

## 2012-12-12 MED ORDER — BIOTENE DRY MOUTH MT LIQD
15.0000 mL | Freq: Two times a day (BID) | OROMUCOSAL | Status: DC
  Administered 2012-12-12 – 2012-12-15 (×7): 15 mL via OROMUCOSAL

## 2012-12-12 NOTE — Progress Notes (Signed)
Patient passed a small amount of gas.  No bowel movement.  Very little bowel sounds.  Will keep NGT to suction.  Right IJ catheter needs to be removed for comfort and prevent CLABSI.  Marta Lamas. Gae Bon, MD, FACS [phone] [facility] Surgery

## 2012-12-12 NOTE — Progress Notes (Signed)
3 Days Post-Op  Subjective: Pt sitting up in a chair, NPO, complaining of frequent voiding.  +flatus, no bm yet.  Objective: Vital signs in last 24 hours: Temp:  [98 F (36.7 C)-98.8 F (37.1 C)] 98 F (36.7 C) (06/23 0624) Pulse Rate:  [81-88] 84 (06/23 0624) Resp:  [17-24] 24 (06/23 0859) BP: (105-126)/(62-71) 116/66 mmHg (06/23 0624) SpO2:  [95 %-100 %] 96 % (06/23 0859) FiO2 (%):  [96 %] 96 % (06/23 0859) Last BM Date:  (pre op)  Intake/Output from previous day: 06/22 0701 - 06/23 0700 In: 2937.1 [I.V.:2937.1] Out: 4200 [Urine:3550; Emesis/NG output:650] Intake/Output this shift: Total I/O In: 20 [NG/GT:20] Out: 125 [Emesis/NG output:125]  General appearance: alert, cooperative, appears stated age and no distress Resp: clear to auscultation bilaterally Cardio: regular rate and rhythm, S1, S2 normal, no murmur, click, rub or gallop GI: hypoactive bowel sounds, abdomen is soft round and mild tender around the incision site.  LUQ staple intact  LLQ staple intact and areas are healing well and without drainage.  Midline incision wound-minimal amount of serosanguinous drainage, 2 small areas of separated edges, subq and fascia are intact.  Lab Results:   Recent Labs  12/10/12 0510 12/12/12 0500  WBC 19.7* 10.7*  HGB 9.0* 7.6*  HCT 28.1* 24.1*  PLT 407* 338   BMET  Recent Labs  12/10/12 0510 12/12/12 0500  NA 135 135  K 4.2 3.8  CL 102 104  CO2 24 28  GLUCOSE 162* 138*  BUN 15 5*  CREATININE 1.48* 0.89  CALCIUM 8.2* 8.3*   Anti-infectives: Anti-infectives   Start     Dose/Rate Route Frequency Ordered Stop   12/09/12 1745  piperacillin-tazobactam (ZOSYN) IVPB 3.375 g     3.375 g 12.5 mL/hr over 240 Minutes Intravenous 3 times per day 12/09/12 1740        Assessment/Plan: s/p Procedure(s):LAPAROSCOPY DIAGNOSTIC. Right Hemi-colectomy, Resection of Distal Ileum (Right) APPENDECTOMY (Dr. Derrell Lolling 12/09/12) -stable, pain is an issue.  Will continue with PCA  with plans to convert to IV pain medication tomorrow. -pulmonary toilet -ambulate, SCDs, lovenox -Continue with NGT(675ml/24h), consider abdominal XRs -continue with ATBx -IVF at 119ml/hr -Will need to follow up on pathology.  ABL anemia -questionable accuracy, she is hemodynamically stable.  Rpeat CBC  in AM -Patient is type and crossed   LOS: 3 days   Nechelle Petrizzo ANP-BC 12/12/2012 10:53 AM

## 2012-12-13 ENCOUNTER — Encounter (HOSPITAL_COMMUNITY): Payer: Self-pay | Admitting: General Surgery

## 2012-12-13 ENCOUNTER — Inpatient Hospital Stay (HOSPITAL_COMMUNITY): Payer: BC Managed Care – PPO

## 2012-12-13 ENCOUNTER — Telehealth: Payer: Self-pay

## 2012-12-13 DIAGNOSIS — K37 Unspecified appendicitis: Secondary | ICD-10-CM

## 2012-12-13 LAB — TYPE AND SCREEN
Unit division: 0
Unit division: 0
Unit division: 0

## 2012-12-13 LAB — CBC
MCH: 26.1 pg (ref 26.0–34.0)
Platelets: 383 10*3/uL (ref 150–400)
RBC: 3.14 MIL/uL — ABNORMAL LOW (ref 3.87–5.11)
WBC: 8.3 10*3/uL (ref 4.0–10.5)

## 2012-12-13 LAB — BASIC METABOLIC PANEL
CO2: 27 mEq/L (ref 19–32)
Calcium: 8.7 mg/dL (ref 8.4–10.5)
Chloride: 101 mEq/L (ref 96–112)
GFR calc Af Amer: 76 mL/min — ABNORMAL LOW (ref >90)
Sodium: 137 mEq/L (ref 135–145)

## 2012-12-13 NOTE — Telephone Encounter (Signed)
Pt has not been scheduled for an EGD. Colon recall entered for 1year.

## 2012-12-13 NOTE — Consult Note (Signed)
Reason for Referral: New diagnosis of colon cancer   HPI: This is a 62 year old women native of [place] where she lived the majority of her life. She is a rather healthy woman without any significant comorbid condition who had some right sided abdominal pain 2 years ago, at which time she had a CT scan of her abdomen that was negative. Her pain went away until this April. She has been evaluated by her PCP several times and found to have anemia. She just had a colonoscopy 2 weeks ago from today that was clean and did not reveal any findings to explain her RLQ abdominal pain. She denies hematochezia or change in bowel habits, weight loss, fevers, chills, or night sweats. Her appetite has been normal.  Last week her pain began to worsen. It's a colicky type pain. It hurts worse when she lays down. She was at work for and apparently passed out on 12/09/2012.  She was brought here to the [facility]. 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 Low attenuating lesion in the liver not definitely seen on the prior study may represent a metastatic deposit. Possible large metastatic deposit the right hemi pelvis versus intense inflammatory change.  She underwent a diagnostic laparoscopy, laparotomy, evacuation of pelvic abscess and right hemicolectomy with ileocolonic anastomosis on 12/09/2012.  The pathology from that operation showed invasive well differentiated colorectal adenocarcinoma with the tumor spans 6 cm in greatest dimension invading through the muscularis propria with margins are negative. 2 of 18 lymph nodes were positive for metastatic adenocarcinoma. The pathological diagnosis was T3 N1.  Patient is recovering quite nicely she was ambulating the hallways today without any major difficulties. Her energy to have been discontinued and she has tolerated clears for the time being. Her pain is much improved at  this time.  Past Medical History  Diagnosis Date  . Anemia   :  Past Surgical History  Procedure Laterality Date  . Abdominal hysterectomy    . Ganglion cyst excision    . Laparoscopy N/A 12/09/2012    Procedure: LAPAROSCOPY DIAGNOSTIC;  Surgeon: Axel Filler, MD;  Location: [facility] OR;  Service: General;  Laterality: N/A;  . Partial colectomy Right 12/09/2012    Procedure:  Right Hemi-colectomy, Resection of Distal Ileum;  Surgeon: Axel Filler, MD;  Location: [facility] OR;  Service: General;  Laterality: Right;  . Appendectomy N/A 12/09/2012    Procedure: APPENDECTOMY;  Surgeon: Axel Filler, MD;  Location: [facility] OR;  Service: General;  Laterality: N/A;  :  Current Facility-Administered Medications  Medication Dose Route Frequency Provider Last Rate Last Dose  . antiseptic oral rinse (BIOTENE) solution 15 mL  15 mL Mouth Rinse q12n4p Axel Filler, MD   15 mL at 12/13/12 1244  . chlorhexidine (PERIDEX) 0.12 % solution 15 mL  15 mL Mouth Rinse BID Axel Filler, MD   15 mL at 12/13/12 1054  . dextrose 5 % and 0.45 % NaCl with KCl 20 mEq/L infusion   Intravenous Continuous Emina Riebock, NP 100 mL/hr at 12/13/12 0412    . diphenhydrAMINE (BENADRYL) injection 12.5 mg  12.5 mg Intravenous Q6H PRN Axel Filler, MD       Or  . diphenhydrAMINE (BENADRYL) 12.5 MG/5ML elixir 12.5 mg  12.5 mg Oral Q6H PRN Axel Filler, MD      . heparin injection 5,000 Units  5,000 Units Subcutaneous Q8H Mariella Saa, MD   5,000  Units at 12/13/12 6295  . morphine 2 MG/ML injection 1-4 mg  1-4 mg Intravenous Q2H PRN Letha Cape, PA-C   4 mg at 12/13/12 1054  . naloxone [facility]) injection 0.4 mg  0.4 mg Intravenous PRN Axel Filler, MD       And  . sodium chloride 0.9 % injection 9 mL  9 mL Intravenous PRN Axel Filler, MD   9 mL at 12/10/12 1826  . ondansetron (ZOFRAN) injection 4 mg  4 mg Intravenous Q6H PRN Axel Filler, MD      . pantoprazole (PROTONIX) injection 40 mg  40 mg  Intravenous QHS Letha Cape, PA-C   40 mg at 12/12/12 2221  . phenol (CHLORASEPTIC) mouth spray 1 spray  1 spray Mouth/Throat PRN Mariella Saa, MD   1 spray at 12/11/12 1457  . piperacillin-tazobactam (ZOSYN) IVPB 3.375 g  3.375 g Intravenous Q8H Letha Cape, PA-C   3.375 g at 12/13/12 2841  . sodium chloride 0.9 % injection 10-40 mL  10-40 mL Intracatheter PRN Axel Filler, MD   10 mL at 12/11/12 1817       No Known Allergies:  Family History  Problem Relation Age of Onset  . Heart disease Mother   . COPD Mother   :  History   Social History  . Marital Status: Married    Spouse Name: N/A    Number of Children: 3  . Years of Education: N/A   Occupational History  . Sales Other   Social History Main Topics  . Smoking status: Former Games developer  . Smokeless tobacco: Never Used  . Alcohol Use: Yes     Comment: wine a couple of times a week  . Drug Use: No  . Sexually Active: Not on file   Other Topics Concern  . Not on file   Social History Narrative  . No narrative on file  :  A comprehensive review of systems was negative.  Exam: ECOG 1 Blood pressure 132/86, pulse 98, temperature 98.1 F (36.7 C), temperature source Oral, resp. rate 20, height 5' 2.25" (1.581 m), weight 161 lb 3.2 oz (73.12 kg), SpO2 98.00%. General appearance: alert and cooperative Head: Normocephalic, without obvious abnormality, atraumatic Throat: lips, mucosa, and tongue normal; teeth and gums normal Neck: no adenopathy, no carotid bruit, no JVD, supple, symmetrical, trachea midline and thyroid not enlarged, symmetric, no tenderness/mass/nodules Resp: clear to auscultation bilaterally Chest wall: no tenderness Cardio: regular rate and rhythm, S1, S2 normal, no murmur, click, rub or gallop GI: soft, non-tender; bowel sounds normal; no masses,  no organomegaly Extremities: extremities normal, atraumatic, no cyanosis or edema Skin: Skin color, texture, turgor normal. No rashes or  lesions Lymph nodes: Cervical, supraclavicular, and axillary nodes normal.   Recent Labs  12/12/12 0500 12/13/12 0455  WBC 10.7* 8.3  HGB 7.6* 8.2*  HCT 24.1* 25.6*  PLT 338 383    Recent Labs  12/12/12 0500 12/13/12 0455  NA 135 137  K 3.8 3.6  CL 104 101  CO2 28 27  GLUCOSE 138* 116*  BUN 5* 5*  CREATININE 0.89 0.92  CALCIUM 8.3* 8.7    Pathology: Reviewed in the history of present illness.   Ct Abdomen Pelvis W Contrast  12/09/2012   *RADIOLOGY REPORT*  Clinical Data: Right lower quadrant pain with diffuse abdominal tenderness.  Symptoms intermittently for 2 months.  CT ABDOMEN AND PELVIS WITH CONTRAST  Technique:  Multidetector CT imaging of the abdomen and pelvis was performed  following the standard protocol during bolus administration of intravenous contrast.  Contrast: OMNIPAQUE IOHEXOL 300 MG/ML  SOLN  Comparison: CT abdomen and pelvis 06/11/2011.  Findings: Mild atelectasis is seen in the lung bases.  No pleural or pericardial effusion.  There is marked thickening of the walls of the cecum eccentric on the medial side with a mass lesion measuring approximately 5 cm AP by 4.8 cm transverse by 6.0 cm cranial-caudal.  The appendix is markedly dilated with extensive surrounding inflammatory change consistent with appendicitis likely due to obstruction secondary to the patient's cecal mass.  There is a soft tissue density lesion in the right hemi pelvis measuring approximately 5.1 cm AP by 3.6 cm transverse by 5.5 cm cranial-caudal which could be due to intense inflammatory change or could represent metastatic deposit, possibly a nodal mass.  The colon is otherwise unremarkable.  The stomach and small bowel appear normal.  There is a low attenuating lesion in the posterior right hepatic lobe which measures 1.3 cm in diameter and is not clearly seen on the prior study worrisome for metastatic deposit.  The liver otherwise appears normal.  The spleen, adrenal glands, pancreas  and right kidney appear normal.  Left renal cyst is again seen as on the prior study.  No focal bony abnormality is identified with degenerative disc disease and facet arthropathy at L5-S1.  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  Low attenuating lesion in the liver not definitely seen on the prior study may represent a metastatic deposit.  Possible large metastatic deposit the right hemi pelvis versus intense inflammatory change as described above.   Original Report Authenticated By: Holley Dexter, M.D.     Assessment and Plan:   62 year old woman with the following issues:  1. Diagnosis of well-differentiated adenocarcinoma of the distal ileum with tumor presenting with a 6 cm mass and the appendicitis. Patient underwent diagnostic laparoscopy, laparotomy, evacuation of pelvic abscess and right hemicolectomy with ileocolonic anastomosis on 12/09/2012. The pathology confirmed the presence of at least a stage III colon cancer with 2/18 lymph nodes involved. Her CT scan of the abdomen and pelvis was also reviewed showed there is a possible liver lesion as well as right hemipelvis intense inflammation versus possible metastasis. If the liver lesion or the hemipelvic lesion is indeed involved with colon cancer that is clearly stage IV disease. The natural course of colon cancer was discussed today in detail as well as the treatment approach. At this time, she needs to recover fully from her operation for any further intervention is needed. There'll be no doubt she will require chemotherapy even if her liver lesion and her pelvic lesion are indeed benign. The fact that she has 2 positive lymph nodes but high risk of relapse she will require multiagent chemotherapy utilizing 5-FU and possibly oxaliplatin. However, if she has stage IV disease she will require chemotherapy probably with 5-FU, CPT 11 and bevacizumab.  The  plan will be at this point, for her to fully recover and we will arrange for an outpatient followup at the [facility] and at that time probably will arrange for a PET/CT scan to differentiate her liver lesion as well as her pelvic lesion as it will not only dictate treatment but also prognosis.  All her questions were answered today we'll arrange for followup upon discharge.  2. Iron deficiency anemia likely due to chronic GI bleeding and possible postoperative blood loss:  I would recommend iron replacement orally upon discharge once her bowels are moving adequately.

## 2012-12-13 NOTE — Progress Notes (Signed)
4 Days Post-Op  Subjective: Pt feeling a little better, sitting up in a chair.  +flatus, no bm yet.  Feels a little bloated.  NGT on low intermittent suction.  Pain is under adequate control.  She is ambulating in the hallways.    Objective: Vital signs in last 24 hours: Temp:  [97.6 F (36.4 C)-98.4 F (36.9 C)] 98.4 F (36.9 C) (06/24 0542) Pulse Rate:  [80-104] 89 (06/24 0542) Resp:  [12-24] 16 (06/24 0607) BP: (106-144)/(64-79) 121/79 mmHg (06/24 0542) SpO2:  [94 %-100 %] 99 % (06/24 0607) FiO2 (%):  [96 %-99 %] 98 % (06/23 1405) Last BM Date: 12/08/12  Intake/Output from previous day: 06/23 0701 - 06/24 0700 In: 3151.7 [I.V.:2881.7; NG/GT:170; IV Piggyback:100] Out: 3400 [Urine:2975; Emesis/NG output:425] Intake/Output this shift:   PE General appearance: alert, cooperative, appears stated age and no distress  Resp: clear to auscultation bilaterally  Cardio: regular rate and rhythm, S1, S2 normal, no murmur, click, rub or gallop  GI: +BS, abdomen is soft round and mild tender around the incision site. LUQ staple intact LLQ staple intact and areas are healing well and without drainage. Midline incision wound-dry and intact.   Lab Results:   Recent Labs  12/12/12 0500 12/13/12 0455  WBC 10.7* 8.3  HGB 7.6* 8.2*  HCT 24.1* 25.6*  PLT 338 383   BMET  Recent Labs  12/12/12 0500 12/13/12 0455  NA 135 137  K 3.8 3.6  CL 104 101  CO2 28 27  GLUCOSE 138* 116*  BUN 5* 5*  CREATININE 0.89 0.92  CALCIUM 8.3* 8.7    Anti-infectives: Anti-infectives   Start     Dose/Rate Route Frequency Ordered Stop   12/09/12 1745  piperacillin-tazobactam (ZOSYN) IVPB 3.375 g     3.375 g 12.5 mL/hr over 240 Minutes Intravenous 3 times per day 12/09/12 1740        Assessment/Plan: s/p Procedure(s):LAPAROSCOPY DIAGNOSTIC. Right Hemi-colectomy, Resection of Distal Ileum (Right)  APPENDECTOMY (Dr. Derrell Lolling 12/09/12)  -stable, DC PCA -pulmonary toilet  -ambulate, SCDs,  hepatin -Continue with NGT(421ml/24h), obtain XR today, if no obstruction will DC NGT and start sips of clears. -continue with ATBx  -IVF at 152ml/hr  -Dr. Lindie Spruce to review pathology report with the patient.  Adenocarcinoma of colon -Notify oncology to see if a PAC is needed before discharge.  I have notified Dr. Marzetta Board office who did colonoscopy Nov 18, 2012.Marland Kitchen  -CEA 1(12/09/12)  ABL anemia  -stable   LOS: 4 days    Sloane Junkin ANP-BC  12/13/2012 8:04 AM

## 2012-12-13 NOTE — Telephone Encounter (Signed)
Message copied by Chrystie Nose on Tue Dec 13, 2012  9:17 AM ------      Message from: Melvia Heaps D      Created: Tue Dec 13, 2012  9:06 AM       Pt had recent surgery for colon Ca.            Please cancel EGD.      Needs colo 1 year. ------

## 2012-12-13 NOTE — Progress Notes (Signed)
Spoke with the patient about her diagnosis.  She was not completely surprised.  Also talked with Dr. Derrell Lolling. Will get oncology to see the patient.  Margaret Elliott. Gae Bon, MD, FACS [phone] [facility]

## 2012-12-14 MED ORDER — BISACODYL 10 MG RE SUPP
10.0000 mg | RECTAL | Status: AC
  Administered 2012-12-14: 10 mg via RECTAL
  Filled 2012-12-14: qty 1

## 2012-12-14 MED ORDER — FUROSEMIDE 10 MG/ML IJ SOLN: 20.0000 mg | Freq: Once | INTRAMUSCULAR | Status: DC

## 2012-12-14 NOTE — Progress Notes (Signed)
Patient having complaints of constipation this am. MD ordered dulcolax supp PR. Supp given at 938, patient resulted reported cramping after about 40 min. Pateint reported having BM x 2 continently around 1230.

## 2012-12-14 NOTE — Progress Notes (Signed)
5 Days Post-Op  Subjective: Pt doing well.  Ambulating well  Objective: Vital signs in last 24 hours: Temp:  [98.1 F (36.7 C)-98.7 F (37.1 C)] 98.7 F (37.1 C) (06/25 0622) Pulse Rate:  [82-98] 82 (06/25 0622) Resp:  [16-20] 16 (06/25 0622) BP: (114-132)/(66-86) 114/71 mmHg (06/25 0622) SpO2:  [98 %-100 %] 100 % (06/25 0622) FiO2 (%):  [98 %] 98 % (06/24 1055) Last BM Date: 12/09/12  Intake/Output from previous day: 06/24 0701 - 06/25 0700 In: 1006.7 [P.O.:120; I.V.:836.7; IV Piggyback:50] Out: 1100 [Urine:1100] Intake/Output this shift:    General appearance: alert and cooperative GI: soft/ approp ttp/ nd/ active BS, wond c/d/i  Lab Results:   Recent Labs  12/12/12 0500 12/13/12 0455  WBC 10.7* 8.3  HGB 7.6* 8.2*  HCT 24.1* 25.6*  PLT 338 383   BMET  Recent Labs  12/12/12 0500 12/13/12 0455  NA 135 137  K 3.8 3.6  CL 104 101  CO2 28 27  GLUCOSE 138* 116*  BUN 5* 5*  CREATININE 0.89 0.92  CALCIUM 8.3* 8.7   PT/INR No results found for this basename: LABPROT, INR,  in the last 72 hours ABG No results found for this basename: PHART, PCO2, PO2, HCO3,  in the last 72 hours  Studies/Results: Dg Abd Portable 1v  12/13/2012   *RADIOLOGY REPORT*  Clinical Data: Abdominal bloating.  Postop resection of cecal mass  PORTABLE ABDOMEN - 1 VIEW  Comparison: Preoperative CT abdomen and pelvis 12/09/2012.  Findings: Bowel gas pattern unremarkable without evidence of obstruction or significant ileus.  Oral contrast material from the recent CT within the normal caliber transverse colon. No suggestion of free air on the supine image.  Nasogastric tube tip at the esophagogastric junction.  IMPRESSION: No acute abdominal abnormality.  Nasogastric tube tip at the esophagogastric junction - this should be advanced several centimeters.  These results will be called to the ordering clinician or representative by the Radiologist Assistant, and communication documented in the PACS  Dashboard.   Original Report Authenticated By: Hulan Saas, M.D.    Anti-infectives: Anti-infectives   Start     Dose/Rate Route Frequency Ordered Stop   12/09/12 1745  piperacillin-tazobactam (ZOSYN) IVPB 3.375 g     3.375 g 12.5 mL/hr over 240 Minutes Intravenous 3 times per day 12/09/12 1740        Assessment/Plan: s/p Procedure(s): LAPAROSCOPY DIAGNOSTIC (N/A)  Right Hemi-colectomy, Resection of Distal Ileum (Right) APPENDECTOMY (N/A) encourage ambulatino Dulocolax supp Con't CLD until good bowel function Hopefully home in the next few days.   LOS: 5 days    Marigene Ehlers., [facility] 12/14/2012

## 2012-12-15 LAB — CBC
HCT: 28.9 % — ABNORMAL LOW (ref 36.0–46.0)
Hemoglobin: 9.3 g/dL — ABNORMAL LOW (ref 12.0–15.0)
MCH: 26.1 pg (ref 26.0–34.0)
MCV: 81 fL (ref 78.0–100.0)
RBC: 3.57 MIL/uL — ABNORMAL LOW (ref 3.87–5.11)
WBC: 8.7 10*3/uL (ref 4.0–10.5)

## 2012-12-15 LAB — BASIC METABOLIC PANEL
BUN: 5 mg/dL — ABNORMAL LOW (ref 6–23)
CO2: 27 mEq/L (ref 19–32)
Calcium: 9.2 mg/dL (ref 8.4–10.5)
Chloride: 105 mEq/L (ref 96–112)
Creatinine, Ser: 0.95 mg/dL (ref 0.50–1.10)
Glucose, Bld: 118 mg/dL — ABNORMAL HIGH (ref 70–99)

## 2012-12-15 MED ORDER — PANTOPRAZOLE SODIUM 40 MG PO TBEC
40.0000 mg | DELAYED_RELEASE_TABLET | Freq: Every day | ORAL | Status: DC
  Administered 2012-12-15 – 2012-12-16 (×2): 40 mg via ORAL
  Filled 2012-12-15 (×2): qty 1

## 2012-12-15 MED ORDER — HYDROCODONE-ACETAMINOPHEN 5-325 MG PO TABS
1.0000 | ORAL_TABLET | ORAL | Status: DC | PRN
Start: 2012-12-15 — End: 2012-12-16
  Administered 2012-12-15 – 2012-12-16 (×4): 1 via ORAL
  Filled 2012-12-15 (×2): qty 1
  Filled 2012-12-15: qty 2
  Filled 2012-12-15: qty 1

## 2012-12-15 MED ORDER — ENOXAPARIN SODIUM 40 MG/0.4ML ~~LOC~~ SOLN
40.0000 mg | Freq: Every day | SUBCUTANEOUS | Status: DC
  Administered 2012-12-15: 40 mg via SUBCUTANEOUS
  Filled 2012-12-15 (×3): qty 0.4

## 2012-12-15 NOTE — Progress Notes (Signed)
6 Days Post-Op  Subjective: Pt's abdomen feels great today, but she c/o being very tired.  She just received her pain meds.  Pt denies N/V, and hungry.  Ambulating well.  Excited to go home soon.  Objective: Vital signs in last 24 hours: Temp:  [98.2 F (36.8 C)-98.7 F (37.1 C)] 98.7 F (37.1 C) (06/26 0500) Pulse Rate:  [82-94] 82 (06/26 0500) Resp:  [16-18] 18 (06/26 0500) BP: (113-135)/(65-71) 135/65 mmHg (06/26 0500) SpO2:  [99 %] 99 % (06/26 0500) Last BM Date: 12/14/12  Intake/Output from previous day: 06/25 0701 - 06/26 0700 In: 1200 [I.V.:1200] Out: -  Intake/Output this shift:    PE: Gen:  Alert, NAD, pleasant Abd: Soft, NT/ND, +BS, no HSM, incisions C/D/I, staples in place  Lab Results:   Recent Labs  12/13/12 0455  WBC 8.3  HGB 8.2*  HCT 25.6*  PLT 383   BMET  Recent Labs  12/13/12 0455  NA 137  K 3.6  CL 101  CO2 27  GLUCOSE 116*  BUN 5*  CREATININE 0.92  CALCIUM 8.7   PT/INR No results found for this basename: LABPROT, INR,  in the last 72 hours CMP     Component Value Date/Time   NA 137 12/13/2012 0455   K 3.6 12/13/2012 0455   CL 101 12/13/2012 0455   CO2 27 12/13/2012 0455   GLUCOSE 116* 12/13/2012 0455   BUN 5* 12/13/2012 0455   CREATININE 0.92 12/13/2012 0455   CREATININE 1.06 09/30/2012 1553   CALCIUM 8.7 12/13/2012 0455   PROT 8.1 12/09/2012 1045   ALBUMIN 3.1* 12/09/2012 1045   AST 16 12/09/2012 1045   ALT 12 12/09/2012 1045   ALKPHOS 86 12/09/2012 1045   BILITOT 0.3 12/09/2012 1045   GFRNONAA 65* 12/13/2012 0455   GFRAA 76* 12/13/2012 0455   Lipase     Component Value Date/Time   LIPASE 22 12/09/2012 1045       Studies/Results: No results found.  Anti-infectives: Anti-infectives   Start     Dose/Rate Route Frequency Ordered Stop   12/09/12 1745  piperacillin-tazobactam (ZOSYN) IVPB 3.375 g     3.375 g 12.5 mL/hr over 240 Minutes Intravenous 3 times per day 12/09/12 1740         Assessment/Plan POD #6 s/p  Procedure(s):LAPAROSCOPY DIAGNOSTIC. Right Hemi-colectomy, Resection of Distal Ileum (Right)  APPENDECTOMY (Dr. Derrell Lolling 12/09/12)  -Advance diet to fulls at lunch, soft at dinner -D/C morphine and start Norco -pulmonary toilet  -ambulate, SCDs, heparin -continue with ATBx  -IVF KVO -Hopefully home tomorrow if tolerating diet -removed dry dressing over staples  Adenocarcinoma of colon  -Oncology following -CEA 1.0 (12/09/12)   ABL anemia  -stable     LOS: 6 days    Elliott, Margaret Rieth 12/15/2012, 10:26 AM Pager: [phone]  l

## 2012-12-15 NOTE — Progress Notes (Signed)
Patient having nausea and more pain today.  Hopefully will be able to go home tomorrow.  Margaret Elliott. Gae Bon, MD, FACS [phone] [facility]

## 2012-12-16 ENCOUNTER — Telehealth (INDEPENDENT_AMBULATORY_CARE_PROVIDER_SITE_OTHER): Payer: Self-pay | Admitting: General Surgery

## 2012-12-16 ENCOUNTER — Telehealth: Payer: Self-pay | Admitting: Gastroenterology

## 2012-12-16 DIAGNOSIS — K769 Liver disease, unspecified: Secondary | ICD-10-CM | POA: Diagnosis present

## 2012-12-16 MED ORDER — HYDROCODONE-ACETAMINOPHEN 5-325 MG PO TABS: 1.0000 | ORAL_TABLET | ORAL | Status: DC | PRN

## 2012-12-16 MED ORDER — ONDANSETRON HCL 4 MG PO TABS: 4.0000 mg | ORAL_TABLET | Freq: Three times a day (TID) | ORAL | Status: DC | PRN

## 2012-12-16 NOTE — Telephone Encounter (Signed)
Upon learning of the patient's diagnosis and reviewing the colonoscopy, CT, and operative reports, I called the patient (who was still hospitalized) to establish followup.  I explained, and she confirmed according to her understanding, that nothing was seen at colonoscopy, and that the tumor was in the wall of the colon, which subsequently obstructed the appendix and caused appendicitis.  We both concluded that this would have been picked up eventually since she was still undergoing workup for an iron deficiency anemia. The patient indicated that she was appreciative of the care provided by me and the other MDs.  I advised her that she should undergo followup colonoscopy in 1 year.

## 2012-12-16 NOTE — Telephone Encounter (Signed)
Called patient to set this up but she was still in the hospital hadn't yet been released and she asked if she may call back once she was home and settled, I told her that would be fine...just needs follow up appt with AR in 2-3 weeks per [facility]

## 2012-12-16 NOTE — Progress Notes (Signed)
7 Days Post-Op  Subjective: Patient states she is feeling better today. She states she does not feel as tired or weak this am. Nausea has resolved and denies vomiting. Tolerated some full liquids and solids last night.  +BM and flatus.  She is ambulating in the hallways and reports good pain control.   Objective: Vital signs in last 24 hours: Temp:  [97.8 F (36.6 C)-98.6 F (37 C)] 97.8 F (36.6 C) (06/27 0559) Pulse Rate:  [70-84] 70 (06/27 0559) Resp:  [18] 18 (06/27 0559) BP: (111-134)/(57-69) 111/57 mmHg (06/27 0559) SpO2:  [99 %-100 %] 100 % (06/27 0559) Last BM Date: 12/14/12  Intake/Output from previous day: 06/26 0701 - 06/27 0700 In: 1073.3 [I.V.:973.3; IV Piggyback:100] Out: -  Intake/Output this shift:    PE: Gen:  Alert, NAD, pleasant Card:  RRR, no M/G/R heard Pulm:  CTA, no W/R/R Abd: Soft, NT/ND, no HSM, midline incision wound-dry and intact, staples in place, wound edges approximated and healing well without drainage   Lab Results:   Recent Labs  12/15/12 1428  WBC 8.7  HGB 9.3*  HCT 28.9*  PLT 468*   BMET  Recent Labs  12/15/12 1428  NA 140  K 4.1  CL 105  CO2 27  GLUCOSE 118*  BUN 5*  CREATININE 0.95  CALCIUM 9.2   PT/INR No results found for this basename: LABPROT, INR,  in the last 72 hours CMP     Component Value Date/Time   NA 140 12/15/2012 1428   K 4.1 12/15/2012 1428   CL 105 12/15/2012 1428   CO2 27 12/15/2012 1428   GLUCOSE 118* 12/15/2012 1428   BUN 5* 12/15/2012 1428   CREATININE 0.95 12/15/2012 1428   CREATININE 1.06 09/30/2012 1553   CALCIUM 9.2 12/15/2012 1428   PROT 8.1 12/09/2012 1045   ALBUMIN 3.1* 12/09/2012 1045   AST 16 12/09/2012 1045   ALT 12 12/09/2012 1045   ALKPHOS 86 12/09/2012 1045   BILITOT 0.3 12/09/2012 1045   GFRNONAA 63* 12/15/2012 1428   GFRAA 73* 12/15/2012 1428   Lipase     Component Value Date/Time   LIPASE 22 12/09/2012 1045       Studies/Results: No results  found.  Anti-infectives: Anti-infectives   Start     Dose/Rate Route Frequency Ordered Stop   12/09/12 1745  piperacillin-tazobactam (ZOSYN) IVPB 3.375 g     3.375 g 12.5 mL/hr over 240 Minutes Intravenous 3 times per day 12/09/12 1740         Assessment/Plan  POD #7 s/p Procedure(s):LAPAROSCOPY DIAGNOSTIC. Right Hemi-colectomy, Resection of Distal Ileum (Right)  APPENDECTOMY (Dr. Derrell Lolling 12/09/12)  -pulmonary toilet  -ambulate, SCDs, heparin  -continue with ATBx  -IVF KVO  -Will likely d/c today since tolerating diet well    Adenocarcinoma of colon  -Oncology to follow as an OP -CEA 1.0 (12/09/12)   ABL anemia  -stable -Hgb increased from 8.2 (12/13/12) to 9.3 (12/15/12)   LOS: 7 days    DORT, Felcia Huebert 12/16/2012, 8:24 AM Pager: [phone]

## 2012-12-16 NOTE — Telephone Encounter (Signed)
Message copied by June Leap on Fri Dec 16, 2012 11:16 AM ------      Message from: Senaida Ores      Created: Fri Dec 16, 2012  9:07 AM       Please make appt with Dr. Derrell Lolling in 2-3 weeks to f/u             diagnostic laparoscopy, exporter laparotomy, evacuation of pelvic abscess, right hemicolectomy with ileocolonic anastomosis ------

## 2012-12-16 NOTE — Discharge Summary (Signed)
Physician Discharge Summary  Patient ID: Margaret Elliott MRN: [id number] DOB/AGE: 1950/07/08 62 y.o.  Admit date: 12/09/2012 Discharge date: 12/16/2012  Admitting Diagnosis: 1. RLQ abdominal pain, acute appendicitis which appears secondary to possible cecal mass vs chronic appendicitis  2. liver lesion  3. Pelvic mass vs inflammatory process  4. Fe deficiency anemia  5. Syncopal episode at work  Discharge Diagnosis Patient Active Problem List   Diagnosis Date Noted  . Liver lesion 12/16/2012  . Colonic mass 12/09/2012  . Acute appendicitis 12/09/2012  . Iron deficiency anemia due to chronic blood loss 12/09/2012  . RLQ abdominal pain 11/29/2012  . Pelvic pain in female 09/30/2012    Consultants Dr. Eli Hose - Oncology   Imaging: *RADIOLOGY REPORT*   Clinical Data: Right lower quadrant pain with diffuse abdominal  tenderness. Symptoms intermittently for 2 months.   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 Low  attenuating lesion in the liver not definitely seen on the prior  study may represent a metastatic deposit. Possible large  metastatic deposit the right hemi pelvis versus intense  inflammatory change as described above.  Original Report Authenticated By: Holley Dexter, M.D.   Procedures Dr. Axel Filler (12/09/12) - diagnostic laparoscopy, exploratory laparotomy, evacuation of pelvic abscess, right hemicolectomy with ileocolonic anastomosis  Hospital Course:  [name] is a 62 yo African American female who presented to [facility] with RLQ abdominal pain x 2 months.  Patient stated she had some right sided abdominal pain 2 years ago, at which time she had a CT scan of her abdomen that was negative. Her pain resolved until this April. She had a colonoscopy 2 weeks ago from day of admission that was clean and did not reveal any  findings to explain her RLQ abdominal pain. Her pain worsened prior to admission and described it as a colicky type pain.The pain was worsened by lying down. On day of admission, patient was at work and apparently passed out. She was brought here to the [facility]. She had a CT scan that revealed findings most consistent with a cecal carcinoma resulting in obstruction of the appendix and secondary appendicitis. Low attenuating lesion in the liver not definitely seen on the prior study may represent a metastatic deposit. Possible large metastatic deposit in the right hemi pelvis versus intense inflammatory change. We were asked to see her for further evaluation. She denied hematochezia or change in bowel habits, nausea, vomiting, weight loss, fevers, chills, or night sweats. Patient was admitted and underwent procedure listed above.  Tolerated procedure well and was transferred to the floor.  She was placed on IV Zosyn, fluids, and pain meds. NGT was placed  On POD #1, patient was noted to be doing well with some low BP noted overnight requiring 2L IVF. She also experienced a post op Ileus and acute blood loss anemia.  On POD #4, she started clamping trials, the NG tube was removed, and she as started on clear liquids.  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 Oncology was consulted. He felt a PET/CT scan will be needed following discharge to differentiate liver and pelvic lesions. He felt the patient will require multiagent chemotherapy.  On POD #6, patient c/o being very tired, but this soon resolved after discontinuing IV narcotics. Diet was advanced as tolerated and nausea was controlled with minimal antiemetics. On  POD #7, patient reported feeling much better and stated nausea had resolved. Repeat CBC continued to show an upward trend in hgb. The patient was voiding well, tolerating diet, ambulating well, pain well controlled,  vital signs stable, incisions c/d/i and felt stable for discharge home.  Patient will follow up in our office in 2-3 weeks with Dr. Derrell Lolling and knows to call with questions or concerns. Patient also to follow up with Dr. Clelia Croft with oncology as outpatient at the [facility].       Medication List    TAKE these medications       diphenhydrAMINE 25 mg capsule  Commonly known as:  BENADRYL  Take 25 mg by mouth every 6 (six) hours as needed for itching.     HYDROcodone-acetaminophen 5-325 MG per tablet  Commonly known as:  NORCO/VICODIN  Take 1-2 tablets by mouth every 4 (four) hours as needed.     multivitamin tablet  Take 1 tablet by mouth daily.     ondansetron 4 MG tablet  Commonly known as:  ZOFRAN  Take 1 tablet (4 mg total) by mouth every 8 (eight) hours as needed for nausea.     TYLENOL PO  Take 2 tablets by mouth daily.             Follow-up Information   Follow up with Lajean Saver, MD. Schedule an appointment as soon as possible for a visit in 3 weeks. (Call office to make appt in 2-3 weeks)    Contact information:   [address] [place] [address] [phone]       Signed: [facility] [phone]  12/16/2012, 11:19 AM

## 2012-12-21 ENCOUNTER — Encounter (INDEPENDENT_AMBULATORY_CARE_PROVIDER_SITE_OTHER): Payer: Self-pay

## 2012-12-21 ENCOUNTER — Ambulatory Visit (INDEPENDENT_AMBULATORY_CARE_PROVIDER_SITE_OTHER): Payer: BC Managed Care – PPO | Admitting: General Surgery

## 2012-12-21 NOTE — Progress Notes (Unsigned)
Removed 12 staples. Skin dry, intact, no redness, no drainage. Steri-strips to the site. Instructed pt they would come off 7-10 days. Pt understand and agreeable.

## 2012-12-27 ENCOUNTER — Ambulatory Visit (INDEPENDENT_AMBULATORY_CARE_PROVIDER_SITE_OTHER): Payer: BC Managed Care – PPO | Admitting: General Surgery

## 2012-12-27 ENCOUNTER — Encounter (INDEPENDENT_AMBULATORY_CARE_PROVIDER_SITE_OTHER): Payer: Self-pay | Admitting: General Surgery

## 2012-12-27 VITALS — BP 110/70 | HR 91 | Temp 97.3°F | Resp 18 | Ht 64.0 in | Wt 150.8 lb

## 2012-12-27 DIAGNOSIS — C182 Malignant neoplasm of ascending colon: Secondary | ICD-10-CM

## 2012-12-27 NOTE — Progress Notes (Signed)
Patient ID: Margaret Elliott, female   DOB: 01/07/1951, 62 y.o.   MRN: [id number]  Chief Complaint  Patient presents with  . Routine Post Op    1st po appen/colon    HPI Margaret Elliott is a 62 y.o. female.  The patient is a 62-year-old female who is recently operative for a right colonic mass and appendicitis. The patient was noted to have a stage III colon cancer with a micro-satellite stability. The patient's been doing well postoperatively. She does have some occasional incisional pain. She does state that her bowel movements are sometimes looser than normal. She's tolerating a normal regular diet. The patient was seen in the hospital Dr. Shadad and we'll follow her up as an outpatient for chemotherapy. HPI  Past Medical History  Diagnosis Date  . Anemia   . Cancer     Colon    Past Surgical History  Procedure Laterality Date  . Abdominal hysterectomy    . Ganglion cyst excision    . Laparoscopy N/A 12/09/2012    Procedure: LAPAROSCOPY DIAGNOSTIC;  Surgeon: Davida Falconi, MD;  Location: [facility] OR;  Service: General;  Laterality: N/A;  . Partial colectomy Right 12/09/2012    Procedure:  Right Hemi-colectomy, Resection of Distal Ileum;  Surgeon: Linnaea Ahn, MD;  Location: [facility] OR;  Service: General;  Laterality: Right;  . Appendectomy N/A 12/09/2012    Procedure: APPENDECTOMY;  Surgeon: Leilanny Fluitt, MD;  Location: [facility] OR;  Service: General;  Laterality: N/A;    Family History  Problem Relation Age of Onset  . Heart disease Mother   . COPD Mother     Social History History  Substance Use Topics  . Smoking status: Former Smoker    Types: Cigarettes    Quit date: 06/22/1972  . Smokeless tobacco: Never Used  . Alcohol Use: Yes     Comment: wine a couple of times a week    No Known Allergies  Current Outpatient Prescriptions  Medication Sig Dispense Refill  . Multiple Vitamin (MULTIVITAMIN) tablet Take 1 tablet by mouth daily.       No current facility-administered  medications for this visit.    Review of Systems Review of Systems  Blood pressure 110/70, pulse 91, temperature 97.3 F (36.3 C), temperature source Temporal, resp. rate 18, height 5' 4" (1.626 m), weight 150 lb 12.8 oz (68.402 kg).  Physical Exam Physical Exam  Data Reviewed none  Assessment    62-year-old female with stage III colon cancer status post right ileocolectomy anastomosis.     Plan    1. At this time we'll proceed with Port-A-Cath placement. 2. The patient will follow up with Dr. Shadad, she will call so that the appointment.  3. We discussed the risks and benefitsA Port-A-Cath placement to include infection, malfunction, possible pneumothorax, and the possibility for replacement. The patient agreed and wished to proceed with the procedure.       Margaret Warrior Jr., Margaret Elliott 12/27/2012, 5:30 PM    

## 2012-12-28 ENCOUNTER — Encounter (HOSPITAL_COMMUNITY): Payer: Self-pay | Admitting: Pharmacy Technician

## 2013-01-03 ENCOUNTER — Encounter (HOSPITAL_COMMUNITY): Payer: Self-pay | Admitting: *Deleted

## 2013-01-03 MED ORDER — CEFAZOLIN SODIUM-DEXTROSE 2-3 GM-% IV SOLR
2.0000 g | INTRAVENOUS | Status: AC
  Administered 2013-01-04: 2 g via INTRAVENOUS
  Filled 2013-01-03: qty 50

## 2013-01-03 NOTE — Progress Notes (Signed)
Pt stated that she had "chest pressure" , Sat night. (July 12th)  Pt stated that the pressure was mid chest, no radiation, no SOB, lightheadedness.  Pt said that she sat up and took Rolaids and pressure was relieved within 30 minutes. This was the first episode of chest pressure that patient had ever experienced.  Pt does not see a cardiologist and has no cardiac history.  I unformed Edmonia Caprio of this information/

## 2013-01-04 ENCOUNTER — Encounter (HOSPITAL_COMMUNITY): Payer: Self-pay | Admitting: Vascular Surgery

## 2013-01-04 ENCOUNTER — Ambulatory Visit (HOSPITAL_COMMUNITY): Payer: BC Managed Care – PPO

## 2013-01-04 ENCOUNTER — Ambulatory Visit (HOSPITAL_COMMUNITY)
Admission: RE | Admit: 2013-01-04 | Discharge: 2013-01-04 | Disposition: A | Discharge status: Home / Self Care | Payer: BC Managed Care – PPO | Source: Ambulatory Visit | Attending: General Surgery | Admitting: General Surgery

## 2013-01-04 ENCOUNTER — Encounter (HOSPITAL_COMMUNITY)
Admission: RE | Disposition: A | Discharge status: Home / Self Care | Payer: Self-pay | Source: Ambulatory Visit | Attending: General Surgery

## 2013-01-04 ENCOUNTER — Ambulatory Visit (HOSPITAL_COMMUNITY): Payer: BC Managed Care – PPO | Admitting: Vascular Surgery

## 2013-01-04 ENCOUNTER — Encounter (HOSPITAL_COMMUNITY): Payer: Self-pay | Admitting: *Deleted

## 2013-01-04 DIAGNOSIS — Z87891 Personal history of nicotine dependence: Secondary | ICD-10-CM | POA: Insufficient documentation

## 2013-01-04 DIAGNOSIS — C189 Malignant neoplasm of colon, unspecified: Secondary | ICD-10-CM | POA: Insufficient documentation

## 2013-01-04 DIAGNOSIS — Z9049 Acquired absence of other specified parts of digestive tract: Secondary | ICD-10-CM | POA: Insufficient documentation

## 2013-01-04 DIAGNOSIS — D649 Anemia, unspecified: Secondary | ICD-10-CM | POA: Insufficient documentation

## 2013-01-04 HISTORY — PX: PORTACATH PLACEMENT: SHX2246

## 2013-01-04 LAB — BASIC METABOLIC PANEL
BUN: 13 mg/dL (ref 6–23)
GFR calc Af Amer: 74 mL/min — ABNORMAL LOW (ref >90)
GFR calc non Af Amer: 64 mL/min — ABNORMAL LOW (ref >90)
Potassium: 4 mEq/L (ref 3.5–5.1)

## 2013-01-04 LAB — CBC
Hemoglobin: 9.4 g/dL — ABNORMAL LOW (ref 12.0–15.0)
MCHC: 33 g/dL (ref 30.0–36.0)
RDW: 15.8 % — ABNORMAL HIGH (ref 11.5–15.5)

## 2013-01-04 SURGERY — INSERTION, TUNNELED CENTRAL VENOUS DEVICE, WITH PORT
Anesthesia: General | Site: Chest | Laterality: Left | Wound class: Clean

## 2013-01-04 MED ORDER — OXYCODONE-ACETAMINOPHEN 5-325 MG PO TABS: 1.0000 | ORAL_TABLET | ORAL | Status: DC | PRN

## 2013-01-04 MED ORDER — ONDANSETRON HCL 4 MG/2ML IJ SOLN
INTRAMUSCULAR | Status: DC | PRN
  Administered 2013-01-04: 4 mg via INTRAVENOUS

## 2013-01-04 MED ORDER — CHLORHEXIDINE GLUCONATE 4 % EX LIQD: 1.0000 "application " | Freq: Once | CUTANEOUS | Status: DC

## 2013-01-04 MED ORDER — PHENYLEPHRINE HCL 10 MG/ML IJ SOLN
INTRAMUSCULAR | Status: DC | PRN
  Administered 2013-01-04 (×2): 40 ug via INTRAVENOUS

## 2013-01-04 MED ORDER — DEXAMETHASONE SODIUM PHOSPHATE 10 MG/ML IJ SOLN
INTRAMUSCULAR | Status: DC | PRN
  Administered 2013-01-04: 8 mg via INTRAVENOUS

## 2013-01-04 MED ORDER — BUPIVACAINE HCL (PF) 0.25 % IJ SOLN
INTRAMUSCULAR | Status: AC
  Filled 2013-01-04: qty 30

## 2013-01-04 MED ORDER — BUPIVACAINE HCL 0.25 % IJ SOLN
INTRAMUSCULAR | Status: DC | PRN
  Administered 2013-01-04: 8 mL

## 2013-01-04 MED ORDER — LACTATED RINGERS IV SOLN
INTRAVENOUS | Status: DC | PRN
  Administered 2013-01-04: 09:00:00 via INTRAVENOUS

## 2013-01-04 MED ORDER — SODIUM CHLORIDE 0.9 % IR SOLN
Status: DC | PRN
  Administered 2013-01-04: 11:00:00

## 2013-01-04 MED ORDER — FENTANYL CITRATE 0.05 MG/ML IJ SOLN
INTRAMUSCULAR | Status: DC | PRN
  Administered 2013-01-04 (×4): 25 ug via INTRAVENOUS

## 2013-01-04 MED ORDER — OXYCODONE HCL 5 MG/5ML PO SOLN: 5.0000 mg | Freq: Once | ORAL | Status: DC | PRN

## 2013-01-04 MED ORDER — FENTANYL CITRATE 0.05 MG/ML IJ SOLN: 25.0000 ug | INTRAMUSCULAR | Status: DC | PRN

## 2013-01-04 MED ORDER — OXYCODONE HCL 5 MG PO TABS: 5.0000 mg | ORAL_TABLET | Freq: Once | ORAL | Status: DC | PRN

## 2013-01-04 MED ORDER — LIDOCAINE HCL (CARDIAC) 20 MG/ML IV SOLN
INTRAVENOUS | Status: DC | PRN
  Administered 2013-01-04: 80 mg via INTRAVENOUS

## 2013-01-04 MED ORDER — MIDAZOLAM HCL 5 MG/5ML IJ SOLN
INTRAMUSCULAR | Status: DC | PRN
  Administered 2013-01-04: 2 mg via INTRAVENOUS

## 2013-01-04 MED ORDER — ONDANSETRON HCL 4 MG/2ML IJ SOLN: 4.0000 mg | Freq: Four times a day (QID) | INTRAMUSCULAR | Status: DC | PRN

## 2013-01-04 MED ORDER — PROPOFOL 10 MG/ML IV BOLUS
INTRAVENOUS | Status: DC | PRN
  Administered 2013-01-04: 160 mg via INTRAVENOUS

## 2013-01-04 MED ORDER — ARTIFICIAL TEARS OP OINT
TOPICAL_OINTMENT | OPHTHALMIC | Status: DC | PRN
  Administered 2013-01-04: 1 via OPHTHALMIC

## 2013-01-04 SURGICAL SUPPLY — 53 items
BAG DECANTER FOR FLEXI CONT (MISCELLANEOUS) ×2 IMPLANT
BLADE SURG 10 STRL SS (BLADE) ×2 IMPLANT
BLADE SURG 15 STRL LF DISP TIS (BLADE) ×1 IMPLANT
BLADE SURG 15 STRL SS (BLADE) ×1
BLADE SURG ROTATE 9660 (MISCELLANEOUS) IMPLANT
CANISTER SUCTION 2500CC (MISCELLANEOUS) IMPLANT
CHLORAPREP W/TINT 10.5 ML (MISCELLANEOUS) ×2 IMPLANT
CLOTH BEACON ORANGE TIMEOUT ST (SAFETY) ×2 IMPLANT
COVER MAYO STAND STRL (DRAPES) ×2 IMPLANT
COVER SURGICAL LIGHT HANDLE (MISCELLANEOUS) ×2 IMPLANT
CRADLE DONUT ADULT HEAD (MISCELLANEOUS) ×2 IMPLANT
DECANTER SPIKE VIAL GLASS SM (MISCELLANEOUS) ×2 IMPLANT
DERMABOND ADVANCED (GAUZE/BANDAGES/DRESSINGS) ×1
DERMABOND ADVANCED .7 DNX12 (GAUZE/BANDAGES/DRESSINGS) ×1 IMPLANT
DRAPE C-ARM 42X72 X-RAY (DRAPES) ×2 IMPLANT
DRAPE CHEST BREAST 15X10 FENES (DRAPES) ×2 IMPLANT
DRAPE UTILITY 15X26 W/TAPE STR (DRAPE) ×4 IMPLANT
ELECT CAUTERY BLADE 6.4 (BLADE) ×2 IMPLANT
ELECT REM PT RETURN 9FT ADLT (ELECTROSURGICAL) ×2
ELECTRODE REM PT RTRN 9FT ADLT (ELECTROSURGICAL) ×1 IMPLANT
GAUZE SPONGE 4X4 16PLY XRAY LF (GAUZE/BANDAGES/DRESSINGS) ×2 IMPLANT
GLOVE BIO SURGEON STRL SZ7.5 (GLOVE) ×2 IMPLANT
GLOVE BIOGEL PI IND STRL 8 (GLOVE) ×1 IMPLANT
GLOVE BIOGEL PI INDICATOR 8 (GLOVE) ×1
GOWN STRL NON-REIN LRG LVL3 (GOWN DISPOSABLE) ×4 IMPLANT
GOWN STRL REIN XL XLG (GOWN DISPOSABLE) ×2 IMPLANT
INTRODUCER 13FR (MISCELLANEOUS) IMPLANT
INTRODUCER COOK 11FR (CATHETERS) IMPLANT
KIT BASIN OR (CUSTOM PROCEDURE TRAY) ×2 IMPLANT
KIT PORT POWER 8FR ISP CVUE (Catheter) IMPLANT
KIT PORT POWER ISP 8FR (Catheter) IMPLANT
KIT POWER CATH 8FR (Catheter) ×2 IMPLANT
KIT ROOM TURNOVER OR (KITS) ×2 IMPLANT
NEEDLE HYPO 25GX1X1/2 BEV (NEEDLE) ×2 IMPLANT
NS IRRIG 1000ML POUR BTL (IV SOLUTION) ×2 IMPLANT
PACK SURGICAL SETUP 50X90 (CUSTOM PROCEDURE TRAY) ×2 IMPLANT
PAD ARMBOARD 7.5X6 YLW CONV (MISCELLANEOUS) ×2 IMPLANT
PENCIL BUTTON HOLSTER BLD 10FT (ELECTRODE) ×2 IMPLANT
SET INTRODUCER 12FR PACEMAKER (SHEATH) IMPLANT
SET SHEATH INTRODUCER 10FR (MISCELLANEOUS) IMPLANT
SHEATH COOK PEEL AWAY SET 9F (SHEATH) IMPLANT
SUT MNCRL AB 3-0 PS2 18 (SUTURE) ×2 IMPLANT
SUT PROLENE 2 0 CT2 30 (SUTURE) ×2 IMPLANT
SUT SILK 2 0 TIES 10X30 (SUTURE) ×2 IMPLANT
SYR 20ML ECCENTRIC (SYRINGE) ×4 IMPLANT
SYR 5ML LUER SLIP (SYRINGE) ×2 IMPLANT
SYR BULB 3OZ (MISCELLANEOUS) ×2 IMPLANT
SYR CONTROL 10ML LL (SYRINGE) ×2 IMPLANT
SYRINGE 10CC LL (SYRINGE) ×2 IMPLANT
TOWEL OR 17X24 6PK STRL BLUE (TOWEL DISPOSABLE) ×2 IMPLANT
TOWEL OR 17X26 10 PK STRL BLUE (TOWEL DISPOSABLE) ×2 IMPLANT
TUBE CONNECTING 12X1/4 (SUCTIONS) IMPLANT
YANKAUER SUCT BULB TIP NO VENT (SUCTIONS) IMPLANT

## 2013-01-04 NOTE — Interval H&P Note (Signed)
History and Physical Interval Note:  01/04/2013 8:23 AM  Margaret Elliott  has presented today for surgery, with the diagnosis of colon cancer  The various methods of treatment have been discussed with the patient and family. After consideration of risks, benefits and other options for treatment, the patient has consented to  Procedure(s): INSERTION PORT-A-CATH (Left) as a surgical intervention .  The patient's history has been reviewed, patient examined, no change in status, stable for surgery.  I have reviewed the patient's chart and labs.  Questions were answered to the patient's satisfaction.     Marigene Ehlers., Jed Limerick

## 2013-01-04 NOTE — Transfer of Care (Signed)
Immediate Anesthesia Transfer of Care Note  Patient: Margaret Elliott  Procedure(s) Performed: Procedure(s): INSERTION PORT-A-CATH (Left)  Patient Location: PACU  Anesthesia Type:General  Level of Consciousness: awake, alert , oriented and patient cooperative  Airway & Oxygen Therapy: Patient Spontanous Breathing  Post-op Assessment: Report given to PACU RN, Post -op Vital signs reviewed and stable and Patient moving all extremities X 4  Post vital signs: Reviewed and stable  Complications: No apparent anesthesia complications

## 2013-01-04 NOTE — H&P (View-Only) (Signed)
Patient ID: Margaret Elliott, female   DOB: 08-May-1951, 62 y.o.   MRN: [id number]  Chief Complaint  Patient presents with  . Routine Post Op    1st po appen/colon    HPI [name] is a 62 y.o. female.  The patient is a 62 year old female who is recently operative for a right colonic mass and appendicitis. The patient was noted to have a stage III colon cancer with a micro-satellite stability. The patient's been doing well postoperatively. She does have some occasional incisional pain. She does state that her bowel movements are sometimes looser than normal. She's tolerating a normal regular diet. The patient was seen in the hospital Dr. Clelia Croft and we'll follow her up as an outpatient for chemotherapy. HPI  Past Medical History  Diagnosis Date  . Anemia   . Cancer     Colon    Past Surgical History  Procedure Laterality Date  . Abdominal hysterectomy    . Ganglion cyst excision    . Laparoscopy N/A 12/09/2012    Procedure: LAPAROSCOPY DIAGNOSTIC;  Surgeon: Axel Filler, MD;  Location: [facility] OR;  Service: General;  Laterality: N/A;  . Partial colectomy Right 12/09/2012    Procedure:  Right Hemi-colectomy, Resection of Distal Ileum;  Surgeon: Axel Filler, MD;  Location: [facility] OR;  Service: General;  Laterality: Right;  . Appendectomy N/A 12/09/2012    Procedure: APPENDECTOMY;  Surgeon: Axel Filler, MD;  Location: [facility] OR;  Service: General;  Laterality: N/A;    Family History  Problem Relation Age of Onset  . Heart disease Mother   . COPD Mother     Social History History  Substance Use Topics  . Smoking status: Former Smoker    Types: Cigarettes    Quit date: 06/22/1972  . Smokeless tobacco: Never Used  . Alcohol Use: Yes     Comment: wine a couple of times a week    No Known Allergies  Current Outpatient Prescriptions  Medication Sig Dispense Refill  . Multiple Vitamin (MULTIVITAMIN) tablet Take 1 tablet by mouth daily.       No current facility-administered  medications for this visit.    Review of Systems Review of Systems  Blood pressure 110/70, pulse 91, temperature 97.3 F (36.3 C), temperature source Temporal, resp. rate 18, height 5\' 4"  (1.626 m), weight 150 lb 12.8 oz (68.402 kg).  Physical Exam Physical Exam  Data Reviewed none  Assessment    62 year old female with stage III colon cancer status post right ileocolectomy anastomosis.     Plan    1. At this time we'll proceed with Port-A-Cath placement. 2. The patient will follow up with Dr. Clelia Croft, she will call so that the appointment.  3. We discussed the risks and benefitsA Port-A-Cath placement to include infection, malfunction, possible pneumothorax, and the possibility for replacement. The patient agreed and wished to proceed with the procedure.       Margaret Elliott., Shontelle Muska 12/27/2012, 5:30 PM

## 2013-01-04 NOTE — Op Note (Signed)
Pre Operative Diagnosis:  Stage III colon cancer  Post Operative Diagnosis: same  Procedure: left Port-A-Cath placement  Surgeon: Dr. Axel Filler  Assistant: none  Anesthesia: GETA  EBL: less than 5 cc  Complications:  none  Counts: reported as correct x 2  Findings:  Patient 8 French MRI-compatible power port placed in her left infraclavicular area. The tip lay at the junction of the SVC and atrial junction.  The port aspirated and flushed easily.  Indications for procedure:  The patient is a 63 year old female who was recently diagnosed and operated on for stage III colon cancer. Patient is in need of port placement for chemotherapy. She was taken back electively for Port-A-Cath placement.  Details of the procedure:The patient was taken back to the operating room. The patient was placed in supine position with bilateral SCDs in place. After appropriate anitbiotics were confirmed, a time-out was confirmed and all facts were verified.  A 3 cm incision was made in the left deltopectoral groove. Bovie cautery was used to maintain hemostasis and dissection was taken down just superficial to the pectoralis fashion. Blunt dissection was used to create a pocket. The port was placed in this area check for a good fit. At this time a Seldinger technique was used to cannulize the subclavian vein. This was confirmed with fluoroscopy. At this time the introducer was then placed over the wire, the wire removed. In the catheter was then placed into the introducer.  Under direct fluoroscopy the catheter was repositioned to be at [facility] /atrial junction. This was approximately 21 cm. At this time the port was connected to the catheter and secured. The port was sutured into the previously made pocket with 2-0 Prolene x3. 2-0 silk was then used to enter the junction of the catheter and port. A final x-ray was then shot to confirm the placement of the tip of the catheter as well as the curve the catheter to the  port. At this time the [facility] needle was used to aspirate from the port and flushed easily.At this time the skin was reapproximated using a 3-0 Monocryl subcuticular fashion. The skin was Dermabond. The patient was awakened from anesthesia and taken to recovery in stable condition.

## 2013-01-04 NOTE — Anesthesia Postprocedure Evaluation (Signed)
Anesthesia Post Note  Patient: Margaret Elliott  Procedure(s) Performed: Procedure(s) (LRB): INSERTION PORT-A-CATH (Left)  Anesthesia type: General  Patient location: PACU  Post pain: Pain level controlled and Adequate analgesia  Post assessment: Post-op Vital signs reviewed, Patient's Cardiovascular Status Stable, Respiratory Function Stable, Patent Airway and Pain level controlled  Last Vitals:  Filed Vitals:   01/04/13 1045  BP: 115/69  Pulse: 94  Temp: 36.6 C  Resp: 16    Post vital signs: Reviewed and stable  Level of consciousness: awake, alert  and oriented  Complications: No apparent anesthesia complications

## 2013-01-04 NOTE — Anesthesia Preprocedure Evaluation (Signed)
Anesthesia Evaluation  Patient identified by MRN, date of birth, ID band Patient awake    Reviewed: Allergy & Precautions, H&P , NPO status , Patient's Chart, lab work & pertinent test results  Airway Mallampati: II  Neck ROM: full    Dental   Pulmonary former smoker,          Cardiovascular     Neuro/Psych    GI/Hepatic Colon CA   Endo/Other    Renal/GU      Musculoskeletal   Abdominal   Peds  Hematology   Anesthesia Other Findings   Reproductive/Obstetrics                           Anesthesia Physical Anesthesia Plan  ASA: II  Anesthesia Plan: General   Post-op Pain Management:    Induction: Intravenous  Airway Management Planned: LMA  Additional Equipment:   Intra-op Plan:   Post-operative Plan:   Informed Consent: I have reviewed the patients History and Physical, chart, labs and discussed the procedure including the risks, benefits and alternatives for the proposed anesthesia with the patient or authorized representative who has indicated his/her understanding and acceptance.     Plan Discussed with: CRNA, Anesthesiologist and Surgeon  Anesthesia Plan Comments:         Anesthesia Quick Evaluation

## 2013-01-04 NOTE — Progress Notes (Signed)
Report given to david rn as caregiver 

## 2013-01-04 NOTE — Preoperative (Signed)
Beta Blockers   Reason not to administer Beta Blockers:Not Applicable 

## 2013-01-04 NOTE — Progress Notes (Signed)
CXR report called to Dr. Derrell Lolling.  PAC in cavoatrial junction, no ptx. No new orders.

## 2013-01-06 ENCOUNTER — Telehealth: Payer: Self-pay | Admitting: Oncology

## 2013-01-06 NOTE — Telephone Encounter (Signed)
Pt lmonvm stating she recently had port placed and was trying to get an appt w/FS. I didn't find any info on pt being seen by FS. Returned pt's call but was not able to reach her and asked that pt call so I could get more info from her. email to Lawson and FS.

## 2013-01-09 ENCOUNTER — Encounter (HOSPITAL_COMMUNITY): Payer: Self-pay | Admitting: General Surgery

## 2013-01-11 ENCOUNTER — Ambulatory Visit (HOSPITAL_BASED_OUTPATIENT_CLINIC_OR_DEPARTMENT_OTHER): Payer: BC Managed Care – PPO | Admitting: Oncology

## 2013-01-11 ENCOUNTER — Telehealth: Payer: Self-pay | Admitting: Oncology

## 2013-01-11 ENCOUNTER — Other Ambulatory Visit: Payer: Self-pay | Admitting: Oncology

## 2013-01-11 ENCOUNTER — Other Ambulatory Visit (HOSPITAL_BASED_OUTPATIENT_CLINIC_OR_DEPARTMENT_OTHER): Payer: BC Managed Care – PPO | Admitting: Lab

## 2013-01-11 ENCOUNTER — Other Ambulatory Visit: Payer: Self-pay | Admitting: *Deleted

## 2013-01-11 ENCOUNTER — Ambulatory Visit: Payer: BC Managed Care – PPO

## 2013-01-11 ENCOUNTER — Encounter: Payer: Self-pay | Admitting: Oncology

## 2013-01-11 VITALS — BP 122/76 | HR 89 | Temp 97.6°F | Resp 18 | Ht 64.0 in | Wt 150.4 lb

## 2013-01-11 DIAGNOSIS — C189 Malignant neoplasm of colon, unspecified: Secondary | ICD-10-CM

## 2013-01-11 DIAGNOSIS — K6389 Other specified diseases of intestine: Secondary | ICD-10-CM

## 2013-01-11 LAB — COMPREHENSIVE METABOLIC PANEL (CC13)
ALT: 12 U/L (ref 0–55)
AST: 15 U/L (ref 5–34)
Albumin: 3.3 g/dL — ABNORMAL LOW (ref 3.5–5.0)
Alkaline Phosphatase: 80 U/L (ref 40–150)
Potassium: 3.5 mEq/L (ref 3.5–5.1)
Sodium: 142 mEq/L (ref 136–145)
Total Protein: 8.1 g/dL (ref 6.4–8.3)

## 2013-01-11 LAB — CBC WITH DIFFERENTIAL/PLATELET
BASO%: 1.3 % (ref 0.0–2.0)
EOS%: 2.8 % (ref 0.0–7.0)
Eosinophils Absolute: 0.1 10*3/uL (ref 0.0–0.5)
MCV: 80.2 fL (ref 79.5–101.0)
MONO%: 8.5 % (ref 0.0–14.0)
NEUT#: 2.5 10*3/uL (ref 1.5–6.5)
RBC: 3.94 10*6/uL (ref 3.70–5.45)
RDW: 16.7 % — ABNORMAL HIGH (ref 11.2–14.5)

## 2013-01-11 MED ORDER — ONDANSETRON HCL 8 MG PO TABS: 8.0000 mg | ORAL_TABLET | Freq: Three times a day (TID) | ORAL | Status: DC | PRN

## 2013-01-11 MED ORDER — LIDOCAINE-PRILOCAINE 2.5-2.5 % EX KIT: 1.0000 "application " | PACK | CUTANEOUS | Status: DC | PRN

## 2013-01-11 NOTE — Progress Notes (Signed)
Note dictated

## 2013-01-11 NOTE — Telephone Encounter (Signed)
talked to pt and gave her appt for lab and MD chemo

## 2013-01-11 NOTE — Progress Notes (Signed)
Checked in new patient with no financial issues. We have her correct email address for communication. Didn't ask about living will/POA.

## 2013-01-11 NOTE — Telephone Encounter (Signed)
Gave pt appt for lab and MD for August 2014 , gave PET Scan order to [facility] for precert, [employer] regarding chemo

## 2013-01-11 NOTE — Telephone Encounter (Signed)
Per staff message and POF I have scheduled appts.  JMW  

## 2013-01-12 NOTE — Progress Notes (Signed)
CC:   Axel Filler, MD Neena Rhymes, M.D.  PRINCIPAL DIAGNOSIS:  This is a 62 year old woman diagnosed with colon cancer in June 2014.  She had presented with an abdominal pain and found to have a cecal mass resulting in obstruction and advanced appendicitis. She has at least stage III disease.  PRIOR THERAPY:  She is status post laparoscopic laparotomy, evacuation of a pelvic abscess and right hemicolectomy with ileocolonic anastomosis done on December 09, 2012.  The pathology showed an invasive, well- differentiated colorectal adenocarcinoma.  Tumor invades through the muscularis propria, with 2/18 lymph nodes involved, with the pathological staging T3 N1b disease.  Her tumor was found to be microsatellite stable.  KRAS mutation is pending.  She is also status post Port-A-Cath insertion that was done on 01/05/2013.  CURRENT THERAPY:  She is under consideration to start systemic chemotherapy.  HISTORY OF PRESENT ILLNESS:  Ms. Kiper presents today for a followup visit after my initial consultation with her in the hospital on December 13, 2012.  She is a very pleasant 62 year old [place] woman who was rather healthy until she presented with abdominal pain and found to have a colon cancer as mentioned above.  Her CT scan on December 09, 2012, has shown the cecal mass as mentioned, but also 2 areas are suspicious.  One was a low-attenuation lesion in the liver that might represent metastatic deposit, also a possible large metastatic deposit in the right hemipelvis versus intense inflammation of unclear etiology.  The patient recovered from her surgery well.  She has regained most activities of daily living.  She went back to work and has done well so far.  She is not reporting any abdominal pain.  She is not reporting any constipation.  Does report some loose habits but no diarrhea.  Does not report any hematochezia.  Does not report any melena.  Does not report any nausea.  Does not  report any vomiting.  Does not report any pelvic pain.  Does not report any bony pain.  Her performance status activity level is all back to normal.  REVIEW OF SYSTEMS:  A comprehensive review of systems was otherwise unremarkable.  PHYSICAL EXAMINATION:  General:  Alert, awake, pleasant woman, appeared in no active distress.  Blood pressure is 122/76, pulse 89, respiration 18, temperature 97.6.  She weighs 150 pounds.  A her BSA is 1.76 sq m. ECOG performance status is 0.  HEENT:  Head is normocephalic, atraumatic.  Pupils equal and round, reactive to light.  Oral mucosa moist and pink.  Neck:  Supple.  No lymphadenopathy.  Heart:  Regular rate and rhythm.  S1 and S2.  Lungs:  Clear to auscultation.  Abdomen: Soft, nontender.  No hepatosplenomegaly.  Could not appreciate any rebound or guarding.  Her scar is well healed.  A Port-A-Cath in the left chest wall appeared without any erythema or induration. Extremities had no edema.  LABORATORY DATA:  Hemoglobin of 10.1, white cell count of 4.7, platelet count 369.  Chemistry showed a creatinine of 0.9, normal liver function tests, albumin 3.3.  ASSESSMENT AND PLAN:  A 62 year old woman with the following issues: 1. Diagnosis of a colon cancer.  Presented with a large right cecal     mass causing obstruction and abscess and appendicitis.  She is     status post laparotomy and evacuation of a pelvic abscess and a     right hemicolectomy and ileocolonic anastomosis done on 12/09/2012.     The pathology, case [id number],  showed an invasive, well-     differentiated colorectal adenocarcinoma with 2/18 lymph nodes     involved indicating at least stage III disease.  Her CT scan preop     showed abnormalities in her liver and possibly right hemipelvic     inflammation.  Although this could represent inflammatory process,     metastatic disease could not be ruled out.  I had another lengthy     discussion today with Ms. Gainey, discussing the  natural course of     colon cancer as well as the treatment option.  At this time, she     has at least stage III disease, possibly stage IV disease depending     on restaging workup.  At this point, she definitely needs systemic     chemotherapy in a combination of 5-FU with oxaliplatin.  Certainly     the goal of this therapy would be curative if she has stage III     disease, and palliative if she has stage IV disease.  I discussed     the logistics of administration of an infusional 5-FU with     leucovorin as well as oxaliplatin in the FOLFOX regimen.  Side     effects associated with this regimen were discussed in details that     includes infusion-related toxicity, nausea, vomiting, abdominal     pain, diarrhea, myelosuppression, neutropenia, neutropenic sepsis,     possible need for intravenous antibiotics and possible     hospitalization, rarely severe illness and death.  Complications     from oxaliplatin that include neutropenia, infusion-related     toxicity, cold sensitivity and peripheral neuropathy were discussed     today in detail.  As mentioned, she is willing to proceed, and we     will set her up for a chemo education class.  Also given a     prescription for Zofran for antiemetics.  Obviously, the restaging     workup will determine whether we add bevacizumab or not, depending     on whether we are dealing with stage III or stage IV disease.  I     will set her up with a PET-CT scan to evaluate her liver lesion as     well as her pelvic lesion, and we will addend therapy accordingly. 2. Intravenous access.  Dr. Derrell Lolling kindly inserted a Port-A-Cath on     July 17th and has given a prescription for EMLA cream, and she was     instructed in how to apply that. 3. Nausea prophylaxis.  Prescription for Zofran was given today for     antiemetic purposes. 4. Psychosocial support.  Ms. Santini appeared slightly overwhelmed and     teary at times.  I have discussed with her  the psychosocial support     staff that we have that includes a psychologist, social worker and     chaplain as well as the [facility], and I have made     these all resources available for her and at this point, she is     aware that she can utilize any these resources at her convenience,     and at this time she declined. 5. Followup.  I will see her back on July 30th, a week from today, for     the start of chemotherapy, also to discuss the results of her PET-     CT scan.  All her questions were answered today.  ______________________________ Benjiman Core, M.D. FNS/MEDQ  D:  01/11/2013  T:  01/11/2013  Job:  [id number]

## 2013-01-16 ENCOUNTER — Encounter (HOSPITAL_COMMUNITY)
Admission: RE | Admit: 2013-01-16 | Discharge: 2013-01-16 | Disposition: A | Discharge status: Home / Self Care | Payer: BC Managed Care – PPO | Source: Ambulatory Visit | Attending: Oncology | Admitting: Oncology

## 2013-01-16 DIAGNOSIS — C189 Malignant neoplasm of colon, unspecified: Secondary | ICD-10-CM | POA: Insufficient documentation

## 2013-01-16 MED ORDER — FLUDEOXYGLUCOSE F - 18 (FDG) INJECTION
19.1000 | Freq: Once | INTRAVENOUS | Status: AC | PRN
  Administered 2013-01-16: 19.1 via INTRAVENOUS

## 2013-01-17 ENCOUNTER — Encounter: Payer: Self-pay | Admitting: *Deleted

## 2013-01-17 ENCOUNTER — Other Ambulatory Visit: Payer: BC Managed Care – PPO

## 2013-01-18 ENCOUNTER — Telehealth: Payer: Self-pay | Admitting: Oncology

## 2013-01-18 ENCOUNTER — Ambulatory Visit (HOSPITAL_BASED_OUTPATIENT_CLINIC_OR_DEPARTMENT_OTHER): Payer: BC Managed Care – PPO

## 2013-01-18 ENCOUNTER — Other Ambulatory Visit: Payer: BC Managed Care – PPO

## 2013-01-18 ENCOUNTER — Ambulatory Visit (HOSPITAL_BASED_OUTPATIENT_CLINIC_OR_DEPARTMENT_OTHER): Payer: BC Managed Care – PPO | Admitting: Oncology

## 2013-01-18 ENCOUNTER — Telehealth: Payer: Self-pay | Admitting: *Deleted

## 2013-01-18 ENCOUNTER — Other Ambulatory Visit (HOSPITAL_BASED_OUTPATIENT_CLINIC_OR_DEPARTMENT_OTHER): Payer: BC Managed Care – PPO | Admitting: Lab

## 2013-01-18 VITALS — BP 136/95 | HR 97 | Temp 98.1°F | Resp 18 | Ht 64.0 in | Wt 152.6 lb

## 2013-01-18 DIAGNOSIS — C189 Malignant neoplasm of colon, unspecified: Secondary | ICD-10-CM

## 2013-01-18 DIAGNOSIS — C180 Malignant neoplasm of cecum: Secondary | ICD-10-CM

## 2013-01-18 DIAGNOSIS — K769 Liver disease, unspecified: Secondary | ICD-10-CM

## 2013-01-18 DIAGNOSIS — K6389 Other specified diseases of intestine: Secondary | ICD-10-CM

## 2013-01-18 DIAGNOSIS — Z5111 Encounter for antineoplastic chemotherapy: Secondary | ICD-10-CM

## 2013-01-18 LAB — CBC WITH DIFFERENTIAL/PLATELET
BASO%: 0.4 % (ref 0.0–2.0)
Basophils Absolute: 0 10*3/uL (ref 0.0–0.1)
EOS%: 1.6 % (ref 0.0–7.0)
HCT: 31.6 % — ABNORMAL LOW (ref 34.8–46.6)
LYMPH%: 29.3 % (ref 14.0–49.7)
MCH: 24.8 pg — ABNORMAL LOW (ref 25.1–34.0)
MCHC: 30.7 g/dL — ABNORMAL LOW (ref 31.5–36.0)
NEUT%: 61.3 % (ref 38.4–76.8)
Platelets: 359 10*3/uL (ref 145–400)

## 2013-01-18 LAB — COMPREHENSIVE METABOLIC PANEL (CC13)
AST: 15 U/L (ref 5–34)
Alkaline Phosphatase: 82 U/L (ref 40–150)
BUN: 10.8 mg/dL (ref 7.0–26.0)
Calcium: 9.3 mg/dL (ref 8.4–10.4)
Chloride: 111 mEq/L — ABNORMAL HIGH (ref 98–109)
Creatinine: 0.9 mg/dL (ref 0.6–1.1)
Glucose: 104 mg/dl (ref 70–140)

## 2013-01-18 MED ORDER — FLUOROURACIL CHEMO INJECTION 2.5 GM/50ML
400.0000 mg/m2 | Freq: Once | INTRAVENOUS | Status: AC
  Administered 2013-01-18: 700 mg via INTRAVENOUS
  Filled 2013-01-18: qty 14

## 2013-01-18 MED ORDER — OXALIPLATIN CHEMO INJECTION 100 MG/20ML
85.0000 mg/m2 | Freq: Once | INTRAVENOUS | Status: AC
  Administered 2013-01-18: 150 mg via INTRAVENOUS
  Filled 2013-01-18: qty 30

## 2013-01-18 MED ORDER — HEPARIN SOD (PORK) LOCK FLUSH 100 UNIT/ML IV SOLN
500.0000 [IU] | Freq: Once | INTRAVENOUS | Status: DC | PRN
Start: 2013-01-18 — End: 2013-01-18
  Filled 2013-01-18: qty 5

## 2013-01-18 MED ORDER — SODIUM CHLORIDE 0.9 % IJ SOLN
10.0000 mL | INTRAMUSCULAR | Status: DC | PRN
  Filled 2013-01-18: qty 10

## 2013-01-18 MED ORDER — DEXAMETHASONE SODIUM PHOSPHATE 10 MG/ML IJ SOLN
10.0000 mg | Freq: Once | INTRAMUSCULAR | Status: AC
  Administered 2013-01-18: 10 mg via INTRAVENOUS

## 2013-01-18 MED ORDER — LEUCOVORIN CALCIUM INJECTION 350 MG
400.0000 mg/m2 | Freq: Once | INTRAVENOUS | Status: AC
  Administered 2013-01-18: 700 mg via INTRAVENOUS
  Filled 2013-01-18: qty 35

## 2013-01-18 MED ORDER — DEXTROSE 5 % IV SOLN
Freq: Once | INTRAVENOUS | Status: AC
  Administered 2013-01-18: 10:00:00 via INTRAVENOUS

## 2013-01-18 MED ORDER — SODIUM CHLORIDE 0.9 % IV SOLN
2400.0000 mg/m2 | INTRAVENOUS | Status: DC
  Administered 2013-01-18: 4200 mg via INTRAVENOUS
  Filled 2013-01-18: qty 84

## 2013-01-18 MED ORDER — ONDANSETRON 8 MG/50ML IVPB (CHCC)
8.0000 mg | Freq: Once | INTRAVENOUS | Status: AC
  Administered 2013-01-18: 8 mg via INTRAVENOUS

## 2013-01-18 NOTE — Telephone Encounter (Signed)
Gave pt appt for lab, ML and MD for August , September 2014 emailed Marcelino Duster reagrding chemo

## 2013-01-18 NOTE — Patient Instructions (Signed)
[facility] Discharge Instructions for Patients Receiving Chemotherapy  Today you received the following chemotherapy agents:  Oxaliplatin, leucovorin, 5-FU/pump To help prevent nausea and vomiting after your treatment, we encourage you to take your nausea medication Fluorouracil, 5-FU injection What is this medicine? FLUOROURACIL, 5-FU (flure oh YOOR a sil) is a chemotherapy drug. It slows the growth of cancer cells. This medicine is used to treat many types of cancer like breast cancer, colon or rectal cancer, pancreatic cancer, and stomach cancer. This medicine may be used for other purposes; ask your health care provider or pharmacist if you have questions. What should I tell my health care provider before I take this medicine? They need to know if you have any of these conditions: -blood disorders -dihydropyrimidine dehydrogenase (DPD) deficiency -infection (especially a virus infection such as chickenpox, cold sores, or herpes) -kidney disease -liver disease -malnourished, poor nutrition -recent or ongoing radiation therapy -an unusual or allergic reaction to fluorouracil, other chemotherapy, other medicines, foods, dyes, or preservatives -pregnant or trying to get pregnant -breast-feeding How should I use this medicine? This drug is given as an infusion or injection into a vein. It is administered in a hospital or clinic by a specially trained health care professional. Talk to your pediatrician regarding the use of this medicine in children. Special care may be needed. Overdosage: If you think you have taken too much of this medicine contact a poison control center or emergency room at once. NOTE: This medicine is only for you. Do not share this medicine with others. What if I miss a dose? It is important not to miss your dose. Call your doctor or health care professional if you are unable to keep an appointment. What may interact with this  medicine? -allopurinol -cimetidine -dapsone -digoxin -hydroxyurea -leucovorin -levamisole -medicines for seizures like ethotoin, fosphenytoin, phenytoin -medicines to increase blood counts like filgrastim, pegfilgrastim, sargramostim -medicines that treat or prevent blood clots like warfarin, enoxaparin, and dalteparin -methotrexate -metronidazole -pyrimethamine -some other chemotherapy drugs like busulfan, cisplatin, estramustine, vinblastine -trimethoprim -trimetrexate -vaccines Talk to your doctor or health care professional before taking any of these medicines: -acetaminophen -aspirin -ibuprofen -ketoprofen -naproxen This list may not describe all possible interactions. Give your health care provider a list of all the medicines, herbs, non-prescription drugs, or dietary supplements you use. Also tell them if you smoke, drink alcohol, or use illegal drugs. Some items may interact with your medicine. What should I watch for while using this medicine? Visit your doctor for checks on your progress. This drug may make you feel generally unwell. This is not uncommon, as chemotherapy can affect healthy cells as well as cancer cells. Report any side effects. Continue your course of treatment even though you feel ill unless your doctor tells you to stop. In some cases, you may be given additional medicines to help with side effects. Follow all directions for their use. Call your doctor or health care professional for advice if you get a fever, chills or sore throat, or other symptoms of a cold or flu. Do not treat yourself. This drug decreases your body's ability to fight infections. Try to avoid being around people who are sick. This medicine may increase your risk to bruise or bleed. Call your doctor or health care professional if you notice any unusual bleeding. Be careful brushing and flossing your teeth or using a toothpick because you may get an infection or bleed more easily. If you  have any dental work done, tell  your dentist you are receiving this medicine. Avoid taking products that contain aspirin, acetaminophen, ibuprofen, naproxen, or ketoprofen unless instructed by your doctor. These medicines may hide a fever. Do not become pregnant while taking this medicine. Women should inform their doctor if they wish to become pregnant or think they might be pregnant. There is a potential for serious side effects to an unborn child. Talk to your health care professional or pharmacist for more information. Do not breast-feed an infant while taking this medicine. Men should inform their doctor if they wish to father a child. This medicine may lower sperm counts. Do not treat diarrhea with over the counter products. Contact your doctor if you have diarrhea that lasts more than 2 days or if it is severe and watery. This medicine can make you more sensitive to the sun. Keep out of the sun. If you cannot avoid being in the sun, wear protective clothing and use sunscreen. Do not use sun lamps or tanning beds/booths. What side effects may I notice from receiving this medicine? Side effects that you should report to your doctor or health care professional as soon as possible: -allergic reactions like skin rash, itching or hives, swelling of the face, lips, or tongue -low blood counts - this medicine may decrease the number of white blood cells, red blood cells and platelets. You may be at increased risk for infections and bleeding. -signs of infection - fever or chills, cough, sore throat, pain or difficulty passing urine -signs of decreased platelets or bleeding - bruising, pinpoint red spots on the skin, black, tarry stools, blood in the urine -signs of decreased red blood cells - unusually weak or tired, fainting spells, lightheadedness -breathing problems -changes in vision -chest pain -mouth sores -nausea and vomiting -pain, swelling, redness at site where injected -pain, tingling,  numbness in the hands or feet -redness, swelling, or sores on hands or feet -stomach pain -unusual bleeding Side effects that usually do not require medical attention (report to your doctor or health care professional if they continue or are bothersome): -changes in finger or toe nails -diarrhea -dry or itchy skin -hair loss -headache -loss of appetite -sensitivity of eyes to the light -stomach upset -unusually teary eyes This list may not describe all possible side effects. Call your doctor for medical advice about side effects. You may report side effects to FDA at 1-800-FDA-1088. Where should I keep my medicine? This drug is given in a hospital or clinic and will not be stored at home. NOTE: This sheet is a summary. It may not cover all possible information. If you have questions about this medicine, talk to your doctor, pharmacist, or health care provider.  2012, Elsevier/Gold Standard. (10/12/2007 1:53:16 PM)Leucovorin injection What is this medicine? LEUCOVORIN (loo koe VOR in) is used to prevent or treat the harmful effects of some medicines. This medicine is used to treat anemia caused by a low amount of folic acid in the body. It is also used with 5-fluorouracil (5-FU) to treat colon cancer. This medicine may be used for other purposes; ask your health care provider or pharmacist if you have questions. What should I tell my health care provider before I take this medicine? They need to know if you have any of these conditions: -anemia from low levels of vitamin B-12 in the blood -an unusual or allergic reaction to leucovorin, folic acid, other medicines, foods, dyes, or preservatives -pregnant or trying to get pregnant -breast-feeding How should I use this medicine? This  medicine is for injection into a muscle or into a vein. It is given by a health care professional in a hospital or clinic setting. Talk to your pediatrician regarding the use of this medicine in children.  Special care may be needed. Overdosage: If you think you have taken too much of this medicine contact a poison control center or emergency room at once. NOTE: This medicine is only for you. Do not share this medicine with others. What if I miss a dose? This does not apply. What may interact with this medicine? -capecitabine -fluorouracil -phenobarbital -phenytoin -primidone -trimethoprim-sulfamethoxazole This list may not describe all possible interactions. Give your health care provider a list of all the medicines, herbs, non-prescription drugs, or dietary supplements you use. Also tell them if you smoke, drink alcohol, or use illegal drugs. Some items may interact with your medicine. What should I watch for while using this medicine? Your condition will be monitored carefully while you are receiving this medicine. This medicine may increase the side effects of 5-fluorouracil, 5-FU. Tell your doctor or health care professional if you have diarrhea or mouth sores that do not get better or that get worse. What side effects may I notice from receiving this medicine? Side effects that you should report to your doctor or health care professional as soon as possible: -allergic reactions like skin rash, itching or hives, swelling of the face, lips, or tongue -breathing problems -fever, infection -mouth sores -unusual bleeding or bruising -unusually weak or tired Side effects that usually do not require medical attention (report to your doctor or health care professional if they continue or are bothersome): -constipation or diarrhea -loss of appetite -nausea, vomiting This list may not describe all possible side effects. Call your doctor for medical advice about side effects. You may report side effects to FDA at 1-800-FDA-1088. Where should I keep my medicine? This drug is given in a hospital or clinic and will not be stored at home. NOTE: This sheet is a summary. It may not cover all  possible information. If you have questions about this medicine, talk to your doctor, pharmacist, or health care provider.  2012, Elsevier/Gold Standard. (12/13/2007 4:50:29 PM)Oxaliplatin Injection What is this medicine? OXALIPLATIN (ox AL i PLA tin) is a chemotherapy drug. It targets fast dividing cells, like cancer cells, and causes these cells to die. This medicine is used to treat cancers of the colon and rectum, and many other cancers. This medicine may be used for other purposes; ask your health care provider or pharmacist if you have questions. What should I tell my health care provider before I take this medicine? They need to know if you have any of these conditions: -kidney disease -an unusual or allergic reaction to oxaliplatin, other chemotherapy, other medicines, foods, dyes, or preservatives -pregnant or trying to get pregnant -breast-feeding How should I use this medicine? This drug is given as an infusion into a vein. It is administered in a hospital or clinic by a specially trained health care professional. Talk to your pediatrician regarding the use of this medicine in children. Special care may be needed. Overdosage: If you think you have taken too much of this medicine contact a poison control center or emergency room at once. NOTE: This medicine is only for you. Do not share this medicine with others. What if I miss a dose? It is important not to miss a dose. Call your doctor or health care professional if you are unable to keep an appointment. What  may interact with this medicine? -medicines to increase blood counts like filgrastim, pegfilgrastim, sargramostim -probenecid -some antibiotics like amikacin, gentamicin, neomycin, polymyxin B, streptomycin, tobramycin -zalcitabine Talk to your doctor or health care professional before taking any of these medicines: -acetaminophen -aspirin -ibuprofen -ketoprofen -naproxen This list may not describe all possible  interactions. Give your health care provider a list of all the medicines, herbs, non-prescription drugs, or dietary supplements you use. Also tell them if you smoke, drink alcohol, or use illegal drugs. Some items may interact with your medicine. What should I watch for while using this medicine? Your condition will be monitored carefully while you are receiving this medicine. You will need important blood work done while you are taking this medicine. This medicine can make you more sensitive to cold. Do not drink cold drinks or use ice. Cover exposed skin before coming in contact with cold temperatures or cold objects. When out in cold weather wear warm clothing and cover your mouth and nose to warm the air that goes into your lungs. Tell your doctor if you get sensitive to the cold. This drug may make you feel generally unwell. This is not uncommon, as chemotherapy can affect healthy cells as well as cancer cells. Report any side effects. Continue your course of treatment even though you feel ill unless your doctor tells you to stop. In some cases, you may be given additional medicines to help with side effects. Follow all directions for their use. Call your doctor or health care professional for advice if you get a fever, chills or sore throat, or other symptoms of a cold or flu. Do not treat yourself. This drug decreases your body's ability to fight infections. Try to avoid being around people who are sick. This medicine may increase your risk to bruise or bleed. Call your doctor or health care professional if you notice any unusual bleeding. Be careful brushing and flossing your teeth or using a toothpick because you may get an infection or bleed more easily. If you have any dental work done, tell your dentist you are receiving this medicine. Avoid taking products that contain aspirin, acetaminophen, ibuprofen, naproxen, or ketoprofen unless instructed by your doctor. These medicines may hide a  fever. Do not become pregnant while taking this medicine. Women should inform their doctor if they wish to become pregnant or think they might be pregnant. There is a potential for serious side effects to an unborn child. Talk to your health care professional or pharmacist for more information. Do not breast-feed an infant while taking this medicine. Call your doctor or health care professional if you get diarrhea. Do not treat yourself. What side effects may I notice from receiving this medicine? Side effects that you should report to your doctor or health care professional as soon as possible: -allergic reactions like skin rash, itching or hives, swelling of the face, lips, or tongue -low blood counts - This drug may decrease the number of white blood cells, red blood cells and platelets. You may be at increased risk for infections and bleeding. -signs of infection - fever or chills, cough, sore throat, pain or difficulty passing urine -signs of decreased platelets or bleeding - bruising, pinpoint red spots on the skin, black, tarry stools, nosebleeds -signs of decreased red blood cells - unusually weak or tired, fainting spells, lightheadedness -breathing problems -chest pain, pressure -cough -diarrhea -jaw tightness -mouth sores -nausea and vomiting -pain, swelling, redness or irritation at the injection site -pain, tingling, numbness in  the hands or feet -problems with balance, talking, walking -redness, blistering, peeling or loosening of the skin, including inside the mouth -trouble passing urine or change in the amount of urine Side effects that usually do not require medical attention (report to your doctor or health care professional if they continue or are bothersome): -changes in vision -constipation -hair loss -loss of appetite -metallic taste in the mouth or changes in taste -stomach pain This list may not describe all possible side effects. Call your doctor for medical  advice about side effects. You may report side effects to FDA at 1-800-FDA-1088. Where should I keep my medicine? This drug is given in a hospital or clinic and will not be stored at home. NOTE: This sheet is a summary. It may not cover all possible information. If you have questions about this medicine, talk to your doctor, pharmacist, or health care provider.  2012, Elsevier/Gold Standard. (01/03/2008 5:22:47 PM)  If you develop nausea and vomiting that is not controlled by your nausea medication, call the clinic.   BELOW ARE SYMPTOMS THAT SHOULD BE REPORTED IMMEDIATELY:  *FEVER GREATER THAN 100.5 F  *CHILLS WITH OR WITHOUT FEVER  NAUSEA AND VOMITING THAT IS NOT CONTROLLED WITH YOUR NAUSEA MEDICATION  *UNUSUAL SHORTNESS OF BREATH  *UNUSUAL BRUISING OR BLEEDING  TENDERNESS IN MOUTH AND THROAT WITH OR WITHOUT PRESENCE OF ULCERS  *URINARY PROBLEMS  *BOWEL PROBLEMS  UNUSUAL RASH Items with * indicate a potential emergency and should be followed up as soon as possible.  Feel free to call the clinic you have any questions or concerns. The clinic phone number is [phone].

## 2013-01-18 NOTE — Telephone Encounter (Signed)
Called pt and left message regarding lab, MD, Ml and chemo for August and September 2014

## 2013-01-18 NOTE — Progress Notes (Signed)
Hematology and Oncology Follow Up Visit  Margaret Elliott [id number] 10/24/1950 62 y.o. 01/18/2013 8:51 AM   Principle Diagnosis: This is a 62 year old woman diagnosed with colon cancer in June 2014. She had presented with an abdominal pain and found to have a cecal mass resulting in obstruction and advanced appendicitis.  She has at least stage III disease. Likely has stage IV disease with a liver mass.     Prior Therapy: She is status post laparoscopic laparotomy, evacuation of a pelvic abscess and right hemicolectomy with ileocolonic anastomosis done on December 09, 2012. The pathology showed an invasive, well- differentiated colorectal adenocarcinoma. Tumor invades through the muscularis propria, with 2/18 lymph nodes involved, with the pathological staging T3 N1b disease. Her tumor was found to be microsatellite stable. KRAS mutation is pending. She is also status post Port-A-Cath insertion that was done on 01/05/2013.   Current therapy: FOLFOX chemotherapy to start 01/18/2013. Avastin to be added with cycle 2 on 02/01/2013.   Interim History:  Ms. Lebon presents today for a followup visit.She is a very pleasant 61-year with colon cancer as above. Since her last visit, she underwent chemotherapy education class and all her questioned answered. She has regained most  activities of daily living. She went back to work and has done well so far. She is not reporting any abdominal pain. She is not reporting any constipation. Does report some loose habits but no diarrhea. Does not report any hematochezia. Does not report any melena. Does not report any nausea. Does not report any vomiting. Does not report any pelvic pain. Does not report any bony pain. Her performance status activity level is all back to normal.     Medications: I have reviewed the patient's current medications.  Current Outpatient Prescriptions  Medication Sig Dispense Refill  . acetaminophen (TYLENOL) 500 MG tablet Take 500 mg by  mouth every 6 (six) hours as needed for pain.      . diphenhydrAMINE (BENADRYL) 25 mg capsule Take 25-50 mg by mouth daily as needed for itching. For allergic reaction      . lidocaine-prilocaine (EMLA) cream Apply 1 application topically as needed. Apply approx 1/2 tsp to skin over port, prior to chemotherapy treatments  1 kit  3  . Multiple Vitamin (MULTIVITAMIN) tablet Take 1 tablet by mouth daily.      . ondansetron (ZOFRAN) 8 MG tablet Take 1 tablet (8 mg total) by mouth every 8 (eight) hours as needed for nausea.  30 tablet  1   No current facility-administered medications for this visit.     Allergies: No Known Allergies  Past Medical History, Surgical history, Social history, and Family History were reviewed and updated.  Review of Systems:  Remaining ROS negative. Physical Exam:  Blood pressure 136/95, pulse 97, temperature 98.1 F (36.7 C), temperature source Oral, resp. rate 18, height 5\' 4"  (1.626 m), weight 152 lb 9.6 oz (69.219 kg). ECOG: 0 General appearance: alert Head: Normocephalic, without obvious abnormality, atraumatic Neck: no adenopathy, no carotid bruit, no JVD, supple, symmetrical, trachea midline and thyroid not enlarged, symmetric, no tenderness/mass/nodules Lymph nodes: Cervical, supraclavicular, and axillary nodes normal. Heart:regular rate and rhythm, S1, S2 normal, no murmur, click, rub or gallop Lung:chest clear, no wheezing, rales, normal symmetric air entry Abdomin: soft, non-tender, without masses or organomegaly EXT:no erythema, induration, or nodules   Lab Results: Lab Results  Component Value Date   WBC 5.7 01/18/2013   HGB 9.7* 01/18/2013   HCT 31.6* 01/18/2013   MCV  80.8 01/18/2013   PLT 359 01/18/2013     Chemistry      Component Value Date/Time   NA 142 01/11/2013 1002   NA 141 01/04/2013 0837   K 3.5 01/11/2013 1002   K 4.0 01/04/2013 0837   CL 106 01/04/2013 0837   CO2 27 01/11/2013 1002   CO2 25 01/04/2013 0837   BUN 12.5 01/11/2013  1002   BUN 13 01/04/2013 0837   CREATININE 0.9 01/11/2013 1002   CREATININE 0.94 01/04/2013 0837   CREATININE 1.06 09/30/2012 1553      Component Value Date/Time   CALCIUM 9.7 01/11/2013 1002   CALCIUM 9.6 01/04/2013 0837   ALKPHOS 80 01/11/2013 1002   ALKPHOS 86 12/09/2012 1045   AST 15 01/11/2013 1002   AST 16 12/09/2012 1045   ALT 12 01/11/2013 1002   ALT 12 12/09/2012 1045   BILITOT 0.39 01/11/2013 1002   BILITOT 0.3 12/09/2012 1045       Radiological Studies: Nm Pet Image Initial (pi) Skull Base To Thigh  01/16/2013   The *RADIOLOGY REPORT*  Clinical Data:  Initial treatment strategy for right-sided colon cancer.  NUCLEAR MEDICINE PET WHOLE BODY  Fasting Blood Glucose:  82  Technique:  19.1 mCi F-18 FDG was injected intravenously. CT data was obtained and used for attenuation correction and anatomic localization only.  (This was not acquired as a diagnostic CT examination.) Additional exam technical data entered on technologist worksheet.  Comparison:  CT scan from 12/09/2012  Findings:  Head/Neck:   No hypermetabolic lymph nodes in the neck.  Chest:  A single hypermetabolic match that a single focus of F D G accumulation is identified in the left subclavian region. SUV max = 10 for this focus which maps to the left-sided Port-A-Cath as it enters the left subclavian vein.  There is no underlying soft tissue nodule or bony abnormality at this level on CT images.  The tip of the left-sided Port-A-Cath is in the mid to lower right atrium.  Abdomen/Pelvis:  A single hypermetabolic lesion is identified in the posterior right liver with SUV max = 12.  This corresponds to the location of the subtle hypodensity seen in the posterior right liver on the previous CT scan although no abnormality can be seenat this location on today's exam performed without intravenous contrast material.  Imaging features are compatible with metastatic disease.  Low-level uptake is seen along the patient's midline incision, not  unexpected.  19 mm low density lesion in the right kidney measures water density and shows no hypermetabolism, likely representing a cyst.  4.0 x 3.0 cm right adnexal lesion shows no hypermetabolism and appears photopenic on PET images.  This many be related to the right ovary or represent a postsurgical seroma or hematoma.  Skeleton:  No focal hypermetabolic activity to suggest skeletal metastasis.  IMPRESSION: Hypermetabolic lesion in the posteromedial right liver compatible with metastatic disease.  No lesion is visible at this location on today's CT images obtained without IV contrast material although a subtle lesion was visible in this area on the previous diagnostic CT.  Abdominal MRI without and with contrast could be performed for further characterization, as warranted.  4 cm right pelvic lesion identified in the adnexal space shows no F D G uptake.  This is of indeterminate etiology.  Pelvic ultrasound may prove helpful to further evaluate, but the lack of hypermetabolism suggests it is not related to metastatic disease or neoplasm.  Focal area of F D G accumulation  in the region of the left subclavian vein may well be related to the presence of the left- sided Port-A-Cath.  No underlying soft tissue lesion is evident. Attention on follow-up imaging is recommended.   Original Report Authenticated By: Kennith Center, M.D.     Impression and Plan:  62 year old woman with the following issues:  1. Diagnosis of a colon cancer. Presented with a large right cecal mass causing obstruction and abscess and appendicitis. PET/CT results discussed with the patient today and indicates likely stage IV disease. She is about to start FOLFOX chemotherapy today. All her questioned answered today. She is ready to proceed.   2. Intravenous access. Dr. Derrell Lolling kindly inserted a Port-A-Cath on July 17th and has given a prescription for EMLA cream, and she was instructed in how to apply that.  3. Nausea prophylaxis.  Prescription for Zofran was given today for antiemetic purposes.  4. Psychosocial support. Ms. Culton appeared less overwhelmed today. I have discussed with her the psychosocial support  staff that we have that includes a psychologist, social worker and chaplain as well as the [facility], and I have made  these all resources available for her and at this point, she is aware that she can utilize any these resources at her convenience,  and at this time she declined. Her husband is with her today for support.   5. Follow up: in two weeks for cycle 2.    [facility], MD 7/30/20148:51 AM

## 2013-01-18 NOTE — Telephone Encounter (Signed)
Per staff message and POF I have scheduled appts.  JMW  

## 2013-01-19 ENCOUNTER — Ambulatory Visit: Payer: BC Managed Care – PPO | Admitting: Oncology

## 2013-01-20 ENCOUNTER — Ambulatory Visit (HOSPITAL_BASED_OUTPATIENT_CLINIC_OR_DEPARTMENT_OTHER): Payer: BC Managed Care – PPO

## 2013-01-20 VITALS — BP 131/78 | HR 69 | Temp 98.1°F | Resp 18

## 2013-01-20 DIAGNOSIS — K6389 Other specified diseases of intestine: Secondary | ICD-10-CM

## 2013-01-20 DIAGNOSIS — C180 Malignant neoplasm of cecum: Secondary | ICD-10-CM

## 2013-01-20 DIAGNOSIS — C189 Malignant neoplasm of colon, unspecified: Secondary | ICD-10-CM

## 2013-01-20 DIAGNOSIS — Z452 Encounter for adjustment and management of vascular access device: Secondary | ICD-10-CM

## 2013-01-20 DIAGNOSIS — K769 Liver disease, unspecified: Secondary | ICD-10-CM

## 2013-01-20 MED ORDER — HEPARIN SOD (PORK) LOCK FLUSH 100 UNIT/ML IV SOLN
500.0000 [IU] | Freq: Once | INTRAVENOUS | Status: AC | PRN
  Administered 2013-01-20: 500 [IU]
  Filled 2013-01-20: qty 5

## 2013-01-20 MED ORDER — SODIUM CHLORIDE 0.9 % IJ SOLN
10.0000 mL | INTRAMUSCULAR | Status: DC | PRN
  Administered 2013-01-20: 10 mL
  Filled 2013-01-20: qty 10

## 2013-01-25 ENCOUNTER — Other Ambulatory Visit (HOSPITAL_BASED_OUTPATIENT_CLINIC_OR_DEPARTMENT_OTHER): Payer: BC Managed Care – PPO | Admitting: Lab

## 2013-01-25 DIAGNOSIS — C189 Malignant neoplasm of colon, unspecified: Secondary | ICD-10-CM

## 2013-01-25 LAB — CBC WITH DIFFERENTIAL/PLATELET
BASO%: 0.5 % (ref 0.0–2.0)
LYMPH%: 38.8 % (ref 14.0–49.7)
MCHC: 32.2 g/dL (ref 31.5–36.0)
MCV: 79.7 fL (ref 79.5–101.0)
MONO#: 0.2 10*3/uL (ref 0.1–0.9)
MONO%: 5.9 % (ref 0.0–14.0)
Platelets: 325 10*3/uL (ref 145–400)
RBC: 3.84 10*6/uL (ref 3.70–5.45)
WBC: 4 10*3/uL (ref 3.9–10.3)

## 2013-01-25 LAB — COMPREHENSIVE METABOLIC PANEL (CC13)
ALT: 11 U/L (ref 0–55)
Alkaline Phosphatase: 79 U/L (ref 40–150)
Sodium: 142 mEq/L (ref 136–145)
Total Bilirubin: 0.35 mg/dL (ref 0.20–1.20)
Total Protein: 7.6 g/dL (ref 6.4–8.3)

## 2013-01-31 ENCOUNTER — Other Ambulatory Visit: Payer: Self-pay | Admitting: Oncology

## 2013-01-31 DIAGNOSIS — C189 Malignant neoplasm of colon, unspecified: Secondary | ICD-10-CM

## 2013-02-01 ENCOUNTER — Ambulatory Visit (HOSPITAL_BASED_OUTPATIENT_CLINIC_OR_DEPARTMENT_OTHER): Payer: BC Managed Care – PPO | Admitting: Oncology

## 2013-02-01 ENCOUNTER — Other Ambulatory Visit (HOSPITAL_BASED_OUTPATIENT_CLINIC_OR_DEPARTMENT_OTHER): Payer: BC Managed Care – PPO | Admitting: Lab

## 2013-02-01 ENCOUNTER — Encounter: Payer: Self-pay | Admitting: Oncology

## 2013-02-01 ENCOUNTER — Ambulatory Visit (HOSPITAL_BASED_OUTPATIENT_CLINIC_OR_DEPARTMENT_OTHER): Payer: BC Managed Care – PPO

## 2013-02-01 VITALS — BP 119/75 | HR 98 | Temp 97.7°F | Resp 18 | Ht 64.0 in | Wt 152.0 lb

## 2013-02-01 DIAGNOSIS — K769 Liver disease, unspecified: Secondary | ICD-10-CM

## 2013-02-01 DIAGNOSIS — Z5111 Encounter for antineoplastic chemotherapy: Secondary | ICD-10-CM

## 2013-02-01 DIAGNOSIS — C180 Malignant neoplasm of cecum: Secondary | ICD-10-CM

## 2013-02-01 DIAGNOSIS — C189 Malignant neoplasm of colon, unspecified: Secondary | ICD-10-CM

## 2013-02-01 DIAGNOSIS — K6389 Other specified diseases of intestine: Secondary | ICD-10-CM

## 2013-02-01 DIAGNOSIS — Z5112 Encounter for antineoplastic immunotherapy: Secondary | ICD-10-CM

## 2013-02-01 LAB — COMPREHENSIVE METABOLIC PANEL (CC13)
Alkaline Phosphatase: 87 U/L (ref 40–150)
Creatinine: 1.2 mg/dL — ABNORMAL HIGH (ref 0.6–1.1)
Glucose: 103 mg/dl (ref 70–140)
Sodium: 142 mEq/L (ref 136–145)
Total Bilirubin: 0.57 mg/dL (ref 0.20–1.20)
Total Protein: 7.9 g/dL (ref 6.4–8.3)

## 2013-02-01 LAB — CBC WITH DIFFERENTIAL/PLATELET
BASO%: 0.9 % (ref 0.0–2.0)
Eosinophils Absolute: 0.1 10*3/uL (ref 0.0–0.5)
LYMPH%: 36.8 % (ref 14.0–49.7)
MCHC: 32.5 g/dL (ref 31.5–36.0)
MCV: 80.1 fL (ref 79.5–101.0)
MONO%: 10.4 % (ref 0.0–14.0)
Platelets: 282 10*3/uL (ref 145–400)
RBC: 3.81 10*6/uL (ref 3.70–5.45)

## 2013-02-01 MED ORDER — HEPARIN SOD (PORK) LOCK FLUSH 100 UNIT/ML IV SOLN
500.0000 [IU] | Freq: Once | INTRAVENOUS | Status: DC | PRN
  Filled 2013-02-01: qty 5

## 2013-02-01 MED ORDER — BEVACIZUMAB CHEMO INJECTION 400 MG/16ML
5.0000 mg/kg | Freq: Once | INTRAVENOUS | Status: AC
  Administered 2013-02-01: 350 mg via INTRAVENOUS
  Filled 2013-02-01: qty 14

## 2013-02-01 MED ORDER — LEUCOVORIN CALCIUM INJECTION 350 MG
400.0000 mg/m2 | Freq: Once | INTRAVENOUS | Status: AC
  Administered 2013-02-01: 700 mg via INTRAVENOUS
  Filled 2013-02-01: qty 35

## 2013-02-01 MED ORDER — SODIUM CHLORIDE 0.9 % IV SOLN
Freq: Once | INTRAVENOUS | Status: AC
  Administered 2013-02-01: 13:00:00 via INTRAVENOUS

## 2013-02-01 MED ORDER — FLUOROURACIL CHEMO INJECTION 5 GM/100ML
2400.0000 mg/m2 | INTRAVENOUS | Status: DC
  Administered 2013-02-01: 4200 mg via INTRAVENOUS
  Filled 2013-02-01: qty 84

## 2013-02-01 MED ORDER — FLUOROURACIL CHEMO INJECTION 2.5 GM/50ML
400.0000 mg/m2 | Freq: Once | INTRAVENOUS | Status: AC
  Administered 2013-02-01: 700 mg via INTRAVENOUS
  Filled 2013-02-01: qty 14

## 2013-02-01 MED ORDER — DEXAMETHASONE SODIUM PHOSPHATE 10 MG/ML IJ SOLN
10.0000 mg | Freq: Once | INTRAMUSCULAR | Status: AC
  Administered 2013-02-01: 10 mg via INTRAVENOUS

## 2013-02-01 MED ORDER — DEXTROSE 5 % IV SOLN
Freq: Once | INTRAVENOUS | Status: AC
  Administered 2013-02-01: 14:00:00 via INTRAVENOUS

## 2013-02-01 MED ORDER — ONDANSETRON 8 MG/50ML IVPB (CHCC)
8.0000 mg | Freq: Once | INTRAVENOUS | Status: AC
  Administered 2013-02-01: 8 mg via INTRAVENOUS

## 2013-02-01 MED ORDER — SODIUM CHLORIDE 0.9 % IJ SOLN
10.0000 mL | INTRAMUSCULAR | Status: DC | PRN
  Filled 2013-02-01: qty 10

## 2013-02-01 MED ORDER — OXALIPLATIN CHEMO INJECTION 100 MG/20ML
85.0000 mg/m2 | Freq: Once | INTRAVENOUS | Status: AC
  Administered 2013-02-01: 150 mg via INTRAVENOUS
  Filled 2013-02-01: qty 30

## 2013-02-01 NOTE — Progress Notes (Signed)
Hematology and Oncology Follow Up Visit  Margaret Elliott [id number] [birth year] 62 y.o. 02/01/2013 12:09 PM   Principle Diagnosis: This is a 62 year old woman diagnosed with colon cancer in June 2014. She had presented with an abdominal pain and found to have a cecal mass resulting in obstruction and advanced appendicitis.  She has at least stage III disease. Likely has stage IV disease with a liver mass.    Prior Therapy: She is status post laparoscopic laparotomy, evacuation of a pelvic abscess and right hemicolectomy with ileocolonic anastomosis done on December 09, 2012. The pathology showed an invasive, well- differentiated colorectal adenocarcinoma. Tumor invades through the muscularis propria, with 2/18 lymph nodes involved, with the pathological staging T3 N1b disease. Her tumor was found to be microsatellite stable. KRAS mutation is pending. She is also status post Port-A-Cath insertion that was done on 01/05/2013.   Current therapy: FOLFOX chemotherapy started 01/18/2013. Avastin to be added with cycle 2 on 02/01/2013.   Interim History:  [name] presents today for a followup visit.She is a very pleasant 62-year with colon cancer as above. Tolerated first cycle of chemo well. She is not reporting any abdominal pain. Does report some loose stools but no diarrhea. Does not report any hematochezia. Does not report any melena. Does not report any nausea. Does not report any vomiting. Does not report any pelvic pain. Does not report any bony pain. Her performance status activity level is all back to normal. No neuropathy.   Medications: I have reviewed the patient's current medications.  Current Outpatient Prescriptions  Medication Sig Dispense Refill  . acetaminophen (TYLENOL) 500 MG tablet Take 500 mg by mouth every 6 (six) hours as needed for pain.      . diphenhydrAMINE (BENADRYL) 25 mg capsule Take 25-50 mg by mouth daily as needed for itching. For allergic reaction      .  lidocaine-prilocaine (EMLA) cream Apply 1 application topically as needed. Apply approx 1/2 tsp to skin over port, prior to chemotherapy treatments  1 kit  3  . Multiple Vitamin (MULTIVITAMIN) tablet Take 1 tablet by mouth daily.      . ondansetron (ZOFRAN) 8 MG tablet Take 1 tablet (8 mg total) by mouth every 8 (eight) hours as needed for nausea.  30 tablet  1   No current facility-administered medications for this visit.     Allergies: No Known Allergies  Past Medical History, Surgical history, Social history, and Family History were reviewed and updated.  Review of Systems:  Remaining ROS negative. Physical Exam:  Blood pressure 119/75, pulse 98, temperature 97.7 F (36.5 C), temperature source Oral, resp. rate 18, height 5\' 4"  (1.626 m), weight 152 lb (68.947 kg). ECOG: 0 General appearance: alert Head: Normocephalic, without obvious abnormality, atraumatic Neck: no adenopathy, no carotid bruit, no JVD, supple, symmetrical, trachea midline and thyroid not enlarged, symmetric, no tenderness/mass/nodules Lymph nodes: Cervical, supraclavicular, and axillary nodes normal. Heart:regular rate and rhythm, S1, S2 normal, no murmur, click, rub or gallop Lung:chest clear, no wheezing, rales, normal symmetric air entry Abdomen: soft, non-tender, without masses or organomegaly EXT:no erythema, induration, or nodules   Lab Results: Lab Results  Component Value Date   WBC 4.1 02/01/2013   HGB 9.9* 02/01/2013   HCT 30.5* 02/01/2013   MCV 80.1 02/01/2013   PLT 282 02/01/2013     Chemistry      Component Value Date/Time   NA 142 02/01/2013 1021   NA 141 01/04/2013 0837   K 4.4 02/01/2013 1021  K 4.0 01/04/2013 0837   CL 106 01/04/2013 0837   CO2 25 02/01/2013 1021   CO2 25 01/04/2013 0837   BUN 15.9 02/01/2013 1021   BUN 13 01/04/2013 0837   CREATININE 1.2* 02/01/2013 1021   CREATININE 0.94 01/04/2013 0837   CREATININE 1.06 09/30/2012 1553      Component Value Date/Time   CALCIUM 9.7  02/01/2013 1021   CALCIUM 9.6 01/04/2013 0837   ALKPHOS 87 02/01/2013 1021   ALKPHOS 86 12/09/2012 1045   AST 15 02/01/2013 1021   AST 16 12/09/2012 1045   ALT 8 02/01/2013 1021   ALT 12 12/09/2012 1045   BILITOT 0.57 02/01/2013 1021   BILITOT 0.3 12/09/2012 1045      Impression and Plan:  62 year old woman with the following issues:  1. Diagnosis of a colon cancer. Presented with a large right cecal mass causing obstruction and abscess and appendicitis. PET/CT results indicate likely stage IV disease. She is here for cycle 2 and will proceed. Avastin wil be added today.  2. Intravenous access. PAC in place.  3. Nausea prophylaxis. She has Zofran at home.  4. Follow up: in two weeks for cycle 3.    Clenton Pare 8/13/201412:09 PM

## 2013-02-01 NOTE — Patient Instructions (Addendum)
[facility] Discharge Instructions for Patients Receiving Chemotherapy  Today you received the following chemotherapy agents:  Oxaliplatin, leucovorin, 5-FU/pump To help prevent nausea and vomiting after your treatment, we encourage you to take your nausea medication Fluorouracil, 5-FU injection What is this medicine? FLUOROURACIL, 5-FU (flure oh YOOR a sil) is a chemotherapy drug. It slows the growth of cancer cells. This medicine is used to treat many types of cancer like breast cancer, colon or rectal cancer, pancreatic cancer, and stomach cancer. This medicine may be used for other purposes; ask your health care provider or pharmacist if you have questions. What should I tell my health care provider before I take this medicine? They need to know if you have any of these conditions: -blood disorders -dihydropyrimidine dehydrogenase (DPD) deficiency -infection (especially a virus infection such as chickenpox, cold sores, or herpes) -kidney disease -liver disease -malnourished, poor nutrition -recent or ongoing radiation therapy -an unusual or allergic reaction to fluorouracil, other chemotherapy, other medicines, foods, dyes, or preservatives -pregnant or trying to get pregnant -breast-feeding How should I use this medicine? This drug is given as an infusion or injection into a vein. It is administered in a hospital or clinic by a specially trained health care professional. Talk to your pediatrician regarding the use of this medicine in children. Special care may be needed. Overdosage: If you think you have taken too much of this medicine contact a poison control center or emergency room at once. NOTE: This medicine is only for you. Do not share this medicine with others. What if I miss a dose? It is important not to miss your dose. Call your doctor or health care professional if you are unable to keep an appointment. What may interact with this  medicine? -allopurinol -cimetidine -dapsone -digoxin -hydroxyurea -leucovorin -levamisole -medicines for seizures like ethotoin, fosphenytoin, phenytoin -medicines to increase blood counts like filgrastim, pegfilgrastim, sargramostim -medicines that treat or prevent blood clots like warfarin, enoxaparin, and dalteparin -methotrexate -metronidazole -pyrimethamine -some other chemotherapy drugs like busulfan, cisplatin, estramustine, vinblastine -trimethoprim -trimetrexate -vaccines Talk to your doctor or health care professional before taking any of these medicines: -acetaminophen -aspirin -ibuprofen -ketoprofen -naproxen This list may not describe all possible interactions. Give your health care provider a list of all the medicines, herbs, non-prescription drugs, or dietary supplements you use. Also tell them if you smoke, drink alcohol, or use illegal drugs. Some items may interact with your medicine. What should I watch for while using this medicine? Visit your doctor for checks on your progress. This drug may make you feel generally unwell. This is not uncommon, as chemotherapy can affect healthy cells as well as cancer cells. Report any side effects. Continue your course of treatment even though you feel ill unless your doctor tells you to stop. In some cases, you may be given additional medicines to help with side effects. Follow all directions for their use. Call your doctor or health care professional for advice if you get a fever, chills or sore throat, or other symptoms of a cold or flu. Do not treat yourself. This drug decreases your body's ability to fight infections. Try to avoid being around people who are sick. This medicine may increase your risk to bruise or bleed. Call your doctor or health care professional if you notice any unusual bleeding. Be careful brushing and flossing your teeth or using a toothpick because you may get an infection or bleed more easily. If you  have any dental work done, tell  your dentist you are receiving this medicine. Avoid taking products that contain aspirin, acetaminophen, ibuprofen, naproxen, or ketoprofen unless instructed by your doctor. These medicines may hide a fever. Do not become pregnant while taking this medicine. Women should inform their doctor if they wish to become pregnant or think they might be pregnant. There is a potential for serious side effects to an unborn child. Talk to your health care professional or pharmacist for more information. Do not breast-feed an infant while taking this medicine. Men should inform their doctor if they wish to father a child. This medicine may lower sperm counts. Do not treat diarrhea with over the counter products. Contact your doctor if you have diarrhea that lasts more than 2 days or if it is severe and watery. This medicine can make you more sensitive to the sun. Keep out of the sun. If you cannot avoid being in the sun, wear protective clothing and use sunscreen. Do not use sun lamps or tanning beds/booths. What side effects may I notice from receiving this medicine? Side effects that you should report to your doctor or health care professional as soon as possible: -allergic reactions like skin rash, itching or hives, swelling of the face, lips, or tongue -low blood counts - this medicine may decrease the number of white blood cells, red blood cells and platelets. You may be at increased risk for infections and bleeding. -signs of infection - fever or chills, cough, sore throat, pain or difficulty passing urine -signs of decreased platelets or bleeding - bruising, pinpoint red spots on the skin, black, tarry stools, blood in the urine -signs of decreased red blood cells - unusually weak or tired, fainting spells, lightheadedness -breathing problems -changes in vision -chest pain -mouth sores -nausea and vomiting -pain, swelling, redness at site where injected -pain, tingling,  numbness in the hands or feet -redness, swelling, or sores on hands or feet -stomach pain -unusual bleeding Side effects that usually do not require medical attention (report to your doctor or health care professional if they continue or are bothersome): -changes in finger or toe nails -diarrhea -dry or itchy skin -hair loss -headache -loss of appetite -sensitivity of eyes to the light -stomach upset -unusually teary eyes This list may not describe all possible side effects. Call your doctor for medical advice about side effects. You may report side effects to FDA at 1-800-FDA-1088. Where should I keep my medicine? This drug is given in a hospital or clinic and will not be stored at home. NOTE: This sheet is a summary. It may not cover all possible information. If you have questions about this medicine, talk to your doctor, pharmacist, or health care provider.  2012, [employer]. (10/12/2007 1:53:16 PM)Leucovorin injection What is this medicine? LEUCOVORIN (loo koe VOR in) is used to prevent or treat the harmful effects of some medicines. This medicine is used to treat anemia caused by a low amount of folic acid in the body. It is also used with 5-fluorouracil (5-FU) to treat colon cancer. This medicine may be used for other purposes; ask your health care provider or pharmacist if you have questions. What should I tell my health care provider before I take this medicine? They need to know if you have any of these conditions: -anemia from low levels of vitamin B-12 in the blood -an unusual or allergic reaction to leucovorin, folic acid, other medicines, foods, dyes, or preservatives -pregnant or trying to get pregnant -breast-feeding How should I use this medicine? This  medicine is for injection into a muscle or into a vein. It is given by a health care professional in a hospital or clinic setting. Talk to your pediatrician regarding the use of this medicine in children.  Special care may be needed. Overdosage: If you think you have taken too much of this medicine contact a poison control center or emergency room at once. NOTE: This medicine is only for you. Do not share this medicine with others. What if I miss a dose? This does not apply. What may interact with this medicine? -capecitabine -fluorouracil -phenobarbital -phenytoin -primidone -trimethoprim-sulfamethoxazole This list may not describe all possible interactions. Give your health care provider a list of all the medicines, herbs, non-prescription drugs, or dietary supplements you use. Also tell them if you smoke, drink alcohol, or use illegal drugs. Some items may interact with your medicine. What should I watch for while using this medicine? Your condition will be monitored carefully while you are receiving this medicine. This medicine may increase the side effects of 5-fluorouracil, 5-FU. Tell your doctor or health care professional if you have diarrhea or mouth sores that do not get better or that get worse. What side effects may I notice from receiving this medicine? Side effects that you should report to your doctor or health care professional as soon as possible: -allergic reactions like skin rash, itching or hives, swelling of the face, lips, or tongue -breathing problems -fever, infection -mouth sores -unusual bleeding or bruising -unusually weak or tired Side effects that usually do not require medical attention (report to your doctor or health care professional if they continue or are bothersome): -constipation or diarrhea -loss of appetite -nausea, vomiting This list may not describe all possible side effects. Call your doctor for medical advice about side effects. You may report side effects to FDA at 1-800-FDA-1088. Where should I keep my medicine? This drug is given in a hospital or clinic and will not be stored at home. NOTE: This sheet is a summary. It may not cover all  possible information. If you have questions about this medicine, talk to your doctor, pharmacist, or health care provider.  2012, [employer] Standard. (12/13/2007 4:50:29 PM)Oxaliplatin Injection What is this medicine? OXALIPLATIN (ox AL i PLA tin) is a chemotherapy drug. It targets fast dividing cells, like cancer cells, and causes these cells to die. This medicine is used to treat cancers of the colon and rectum, and many other cancers. This medicine may be used for other purposes; ask your health care provider or pharmacist if you have questions. What should I tell my health care provider before I take this medicine? They need to know if you have any of these conditions: -kidney disease -an unusual or allergic reaction to oxaliplatin, other chemotherapy, other medicines, foods, dyes, or preservatives -pregnant or trying to get pregnant -breast-feeding How should I use this medicine? This drug is given as an infusion into a vein. It is administered in a hospital or clinic by a specially trained health care professional. Talk to your pediatrician regarding the use of this medicine in children. Special care may be needed. Overdosage: If you think you have taken too much of this medicine contact a poison control center or emergency room at once. NOTE: This medicine is only for you. Do not share this medicine with others. What if I miss a dose? It is important not to miss a dose. Call your doctor or health care professional if you are unable to keep an appointment. What  may interact with this medicine? -medicines to increase blood counts like filgrastim, pegfilgrastim, sargramostim -probenecid -some antibiotics like amikacin, gentamicin, neomycin, polymyxin B, streptomycin, tobramycin -zalcitabine Talk to your doctor or health care professional before taking any of these medicines: -acetaminophen -aspirin -ibuprofen -ketoprofen -naproxen This list may not describe all possible  interactions. Give your health care provider a list of all the medicines, herbs, non-prescription drugs, or dietary supplements you use. Also tell them if you smoke, drink alcohol, or use illegal drugs. Some items may interact with your medicine. What should I watch for while using this medicine? Your condition will be monitored carefully while you are receiving this medicine. You will need important blood work done while you are taking this medicine. This medicine can make you more sensitive to cold. Do not drink cold drinks or use ice. Cover exposed skin before coming in contact with cold temperatures or cold objects. When out in cold weather wear warm clothing and cover your mouth and nose to warm the air that goes into your lungs. Tell your doctor if you get sensitive to the cold. This drug may make you feel generally unwell. This is not uncommon, as chemotherapy can affect healthy cells as well as cancer cells. Report any side effects. Continue your course of treatment even though you feel ill unless your doctor tells you to stop. In some cases, you may be given additional medicines to help with side effects. Follow all directions for their use. Call your doctor or health care professional for advice if you get a fever, chills or sore throat, or other symptoms of a cold or flu. Do not treat yourself. This drug decreases your body's ability to fight infections. Try to avoid being around people who are sick. This medicine may increase your risk to bruise or bleed. Call your doctor or health care professional if you notice any unusual bleeding. Be careful brushing and flossing your teeth or using a toothpick because you may get an infection or bleed more easily. If you have any dental work done, tell your dentist you are receiving this medicine. Avoid taking products that contain aspirin, acetaminophen, ibuprofen, naproxen, or ketoprofen unless instructed by your doctor. These medicines may hide a  fever. Do not become pregnant while taking this medicine. Women should inform their doctor if they wish to become pregnant or think they might be pregnant. There is a potential for serious side effects to an unborn child. Talk to your health care professional or pharmacist for more information. Do not breast-feed an infant while taking this medicine. Call your doctor or health care professional if you get diarrhea. Do not treat yourself. What side effects may I notice from receiving this medicine? Side effects that you should report to your doctor or health care professional as soon as possible: -allergic reactions like skin rash, itching or hives, swelling of the face, lips, or tongue -low blood counts - This drug may decrease the number of white blood cells, red blood cells and platelets. You may be at increased risk for infections and bleeding. -signs of infection - fever or chills, cough, sore throat, pain or difficulty passing urine -signs of decreased platelets or bleeding - bruising, pinpoint red spots on the skin, black, tarry stools, nosebleeds -signs of decreased red blood cells - unusually weak or tired, fainting spells, lightheadedness -breathing problems -chest pain, pressure -cough -diarrhea -jaw tightness -mouth sores -nausea and vomiting -pain, swelling, redness or irritation at the injection site -pain, tingling, numbness in  the hands or feet -problems with balance, talking, walking -redness, blistering, peeling or loosening of the skin, including inside the mouth -trouble passing urine or change in the amount of urine Side effects that usually do not require medical attention (report to your doctor or health care professional if they continue or are bothersome): -changes in vision -constipation -hair loss -loss of appetite -metallic taste in the mouth or changes in taste -stomach pain This list may not describe all possible side effects. Call your doctor for medical  advice about side effects. You may report side effects to FDA at 1-800-FDA-1088. Where should I keep my medicine? This drug is given in a hospital or clinic and will not be stored at home. NOTE: This sheet is a summary. It may not cover all possible information. If you have questions about this medicine, talk to your doctor, pharmacist, or health care provider.  2012, [employer]/Gold Standard. (01/03/2008 5:22:47 PM)  If you develop nausea and vomiting that is not controlled by your nausea medication, call the clinic.   BELOW ARE SYMPTOMS THAT SHOULD BE REPORTED IMMEDIATELY:  *FEVER GREATER THAN 100.5 F  *CHILLS WITH OR WITHOUT FEVER  NAUSEA AND VOMITING THAT IS NOT CONTROLLED WITH YOUR NAUSEA MEDICATION  *UNUSUAL SHORTNESS OF BREATH  *UNUSUAL BRUISING OR BLEEDING  TENDERNESS IN MOUTH AND THROAT WITH OR WITHOUT PRESENCE OF ULCERS  *URINARY PROBLEMS  *BOWEL PROBLEMS  UNUSUAL RASH Items with * indicate a potential emergency and should be followed up as soon as possible.  Feel free to call the clinic you have any questions or concerns. The clinic phone number is [phone].   Bevacizumab injection What is this medicine? BEVACIZUMAB (be va SIZ yoo mab) is a chemotherapy drug. It targets a protein found in many cancer cell types, and halts cancer growth. This drug treats many cancers including non-small cell lung cancer, and colon or rectal cancer. It is usually given with other chemotherapy drugs. This medicine may be used for other purposes; ask your health care provider or pharmacist if you have questions. What should I tell my health care provider before I take this medicine? They need to know if you have any of these conditions: -blood clots -heart disease, including heart failure, heart attack, or chest pain (angina) -high blood pressure -infection (especially a virus infection such as chickenpox, cold sores, or herpes) -kidney disease -lung disease -prior chemotherapy  with doxorubicin, daunorubicin, epirubicin, or other anthracycline type chemotherapy agents -recent or ongoing radiation therapy -recent surgery -stroke -an unusual or allergic reaction to bevacizumab, hamster proteins, mouse proteins, other medicines, foods, dyes, or preservatives -pregnant or trying to get pregnant -breast-feeding How should I use this medicine? This medicine is for infusion into a vein. It is given by a health care professional in a hospital or clinic setting. Talk to your pediatrician regarding the use of this medicine in children. Special care may be needed. Overdosage: If you think you have taken too much of this medicine contact a poison control center or emergency room at once. NOTE: This medicine is only for you. Do not share this medicine with others. What if I miss a dose? It is important not to miss your dose. Call your doctor or health care professional if you are unable to keep an appointment. What may interact with this medicine? Interactions are not expected. This list may not describe all possible interactions. Give your health care provider a list of all the medicines, herbs, non-prescription drugs, or dietary supplements you  use. Also tell them if you smoke, drink alcohol, or use illegal drugs. Some items may interact with your medicine. What should I watch for while using this medicine? Your condition will be monitored carefully while you are receiving this medicine. You will need important blood work and urine testing done while you are taking this medicine. During your treatment, let your health care professional know if you have any unusual symptoms, such as difficulty breathing. This medicine may rarely cause 'gastrointestinal perforation' (holes in the stomach, intestines or colon), a serious side effect requiring surgery to repair. This medicine should be started at least 28 days following major surgery and the site of the surgery should be totally  healed. Check with your doctor before scheduling dental work or surgery while you are receiving this treatment. Talk to your doctor if you have recently had surgery or if you have a wound that has not healed. Do not become pregnant while taking this medicine. Women should inform their doctor if they wish to become pregnant or think they might be pregnant. There is a potential for serious side effects to an unborn child. Talk to your health care professional or pharmacist for more information. Do not breast-feed an infant while taking this medicine. This medicine has caused ovarian failure in some women. This medicine may interfere with the ability to have a child. You should talk to your doctor or health care professional if you are concerned about your fertility. What side effects may I notice from receiving this medicine? Side effects that you should report to your doctor or health care professional as soon as possible: -allergic reactions like skin rash, itching or hives, swelling of the face, lips, or tongue -signs of infection - fever or chills, cough, sore throat, pain or trouble passing urine -signs of decreased platelets or bleeding - bruising, pinpoint red spots on the skin, black, tarry stools, nosebleeds, blood in the urine -breathing problems -changes in vision -chest pain -confusion -jaw pain, especially after dental work -mouth sores -seizures -severe abdominal pain -severe headache -sudden numbness or weakness of the face, arm or leg -swelling of legs or ankles -symptoms of a stroke: change in mental awareness, inability to talk or move one side of the body (especially in patients with lung cancer) -trouble passing urine or change in the amount of urine -trouble speaking or understanding -trouble walking, dizziness, loss of balance or coordination Side effects that usually do not require medical attention (report to your doctor or health care professional if they continue or are  bothersome): -constipation -diarrhea -dry skin -headache -loss of appetite -nausea, vomiting This list may not describe all possible side effects. Call your doctor for medical advice about side effects. You may report side effects to FDA at 1-800-FDA-1088. Where should I keep my medicine? This drug is given in a hospital or clinic and will not be stored at home. NOTE: This sheet is a summary. It may not cover all possible information. If you have questions about this medicine, talk to your doctor, pharmacist, or health care provider.  2013, [employer] Standard. (05/09/2010 4:25:37 PM)

## 2013-02-03 ENCOUNTER — Ambulatory Visit (HOSPITAL_BASED_OUTPATIENT_CLINIC_OR_DEPARTMENT_OTHER): Payer: BC Managed Care – PPO

## 2013-02-03 VITALS — BP 126/80 | HR 85 | Temp 97.0°F | Resp 20

## 2013-02-03 DIAGNOSIS — K6389 Other specified diseases of intestine: Secondary | ICD-10-CM

## 2013-02-03 DIAGNOSIS — Z452 Encounter for adjustment and management of vascular access device: Secondary | ICD-10-CM

## 2013-02-03 DIAGNOSIS — K769 Liver disease, unspecified: Secondary | ICD-10-CM

## 2013-02-03 DIAGNOSIS — C189 Malignant neoplasm of colon, unspecified: Secondary | ICD-10-CM

## 2013-02-03 DIAGNOSIS — C180 Malignant neoplasm of cecum: Secondary | ICD-10-CM

## 2013-02-03 MED ORDER — HEPARIN SOD (PORK) LOCK FLUSH 100 UNIT/ML IV SOLN
500.0000 [IU] | Freq: Once | INTRAVENOUS | Status: AC | PRN
  Administered 2013-02-03: 500 [IU]
  Filled 2013-02-03: qty 5

## 2013-02-03 MED ORDER — SODIUM CHLORIDE 0.9 % IJ SOLN
10.0000 mL | INTRAMUSCULAR | Status: DC | PRN
  Administered 2013-02-03: 10 mL
  Filled 2013-02-03: qty 10

## 2013-02-15 ENCOUNTER — Encounter: Payer: Self-pay | Admitting: Oncology

## 2013-02-15 ENCOUNTER — Other Ambulatory Visit (HOSPITAL_BASED_OUTPATIENT_CLINIC_OR_DEPARTMENT_OTHER): Payer: BC Managed Care – PPO | Admitting: Lab

## 2013-02-15 ENCOUNTER — Ambulatory Visit (HOSPITAL_BASED_OUTPATIENT_CLINIC_OR_DEPARTMENT_OTHER): Payer: BC Managed Care – PPO | Admitting: Oncology

## 2013-02-15 ENCOUNTER — Ambulatory Visit (HOSPITAL_BASED_OUTPATIENT_CLINIC_OR_DEPARTMENT_OTHER): Payer: BC Managed Care – PPO

## 2013-02-15 VITALS — BP 126/83 | HR 75 | Temp 96.6°F | Resp 19 | Ht 64.0 in | Wt 152.8 lb

## 2013-02-15 DIAGNOSIS — C180 Malignant neoplasm of cecum: Secondary | ICD-10-CM

## 2013-02-15 DIAGNOSIS — C189 Malignant neoplasm of colon, unspecified: Secondary | ICD-10-CM

## 2013-02-15 DIAGNOSIS — Z5111 Encounter for antineoplastic chemotherapy: Secondary | ICD-10-CM

## 2013-02-15 DIAGNOSIS — R220 Localized swelling, mass and lump, head: Secondary | ICD-10-CM

## 2013-02-15 DIAGNOSIS — J329 Chronic sinusitis, unspecified: Secondary | ICD-10-CM

## 2013-02-15 DIAGNOSIS — K6389 Other specified diseases of intestine: Secondary | ICD-10-CM

## 2013-02-15 DIAGNOSIS — K769 Liver disease, unspecified: Secondary | ICD-10-CM

## 2013-02-15 DIAGNOSIS — Z5112 Encounter for antineoplastic immunotherapy: Secondary | ICD-10-CM

## 2013-02-15 LAB — CBC WITH DIFFERENTIAL/PLATELET
BASO%: 0.5 % (ref 0.0–2.0)
Eosinophils Absolute: 0.1 10*3/uL (ref 0.0–0.5)
LYMPH%: 37.9 % (ref 14.0–49.7)
MCHC: 31.1 g/dL — ABNORMAL LOW (ref 31.5–36.0)
MCV: 81.3 fL (ref 79.5–101.0)
MONO%: 11.5 % (ref 0.0–14.0)
NEUT%: 48.9 % (ref 38.4–76.8)
Platelets: 220 10*3/uL (ref 145–400)
RBC: 3.96 10*6/uL (ref 3.70–5.45)

## 2013-02-15 LAB — COMPREHENSIVE METABOLIC PANEL (CC13)
Alkaline Phosphatase: 74 U/L (ref 40–150)
Creatinine: 1 mg/dL (ref 0.6–1.1)
Glucose: 100 mg/dl (ref 70–140)
Sodium: 141 mEq/L (ref 136–145)
Total Bilirubin: 0.54 mg/dL (ref 0.20–1.20)
Total Protein: 7.8 g/dL (ref 6.4–8.3)

## 2013-02-15 MED ORDER — ONDANSETRON 8 MG/50ML IVPB (CHCC)
8.0000 mg | Freq: Once | INTRAVENOUS | Status: AC
  Administered 2013-02-15: 8 mg via INTRAVENOUS

## 2013-02-15 MED ORDER — PROCHLORPERAZINE MALEATE 10 MG PO TABS: 10.0000 mg | ORAL_TABLET | Freq: Four times a day (QID) | ORAL | Status: DC | PRN

## 2013-02-15 MED ORDER — SODIUM CHLORIDE 0.9 % IV SOLN
5.0000 mg/kg | Freq: Once | INTRAVENOUS | Status: AC
  Administered 2013-02-15: 350 mg via INTRAVENOUS
  Filled 2013-02-15: qty 14

## 2013-02-15 MED ORDER — OXALIPLATIN CHEMO INJECTION 100 MG/20ML
85.0000 mg/m2 | Freq: Once | INTRAVENOUS | Status: AC
  Administered 2013-02-15: 150 mg via INTRAVENOUS
  Filled 2013-02-15: qty 30

## 2013-02-15 MED ORDER — LEUCOVORIN CALCIUM INJECTION 350 MG
400.0000 mg/m2 | Freq: Once | INTRAVENOUS | Status: AC
  Administered 2013-02-15: 700 mg via INTRAVENOUS
  Filled 2013-02-15: qty 35

## 2013-02-15 MED ORDER — DEXAMETHASONE SODIUM PHOSPHATE 10 MG/ML IJ SOLN
10.0000 mg | Freq: Once | INTRAMUSCULAR | Status: AC
  Administered 2013-02-15: 10 mg via INTRAVENOUS

## 2013-02-15 MED ORDER — FLUOROURACIL CHEMO INJECTION 2.5 GM/50ML
400.0000 mg/m2 | Freq: Once | INTRAVENOUS | Status: AC
  Administered 2013-02-15: 700 mg via INTRAVENOUS
  Filled 2013-02-15: qty 14

## 2013-02-15 MED ORDER — DEXTROSE 5 % IV SOLN
Freq: Once | INTRAVENOUS | Status: AC
  Administered 2013-02-15: 13:00:00 via INTRAVENOUS

## 2013-02-15 MED ORDER — FLUOROURACIL CHEMO INJECTION 5 GM/100ML
2400.0000 mg/m2 | INTRAVENOUS | Status: DC
  Administered 2013-02-15: 4200 mg via INTRAVENOUS
  Filled 2013-02-15: qty 84

## 2013-02-15 MED ORDER — SODIUM CHLORIDE 0.9 % IV SOLN
Freq: Once | INTRAVENOUS | Status: AC
  Administered 2013-02-15: 13:00:00 via INTRAVENOUS

## 2013-02-15 MED ORDER — LEVOFLOXACIN 500 MG PO TABS: 500.0000 mg | ORAL_TABLET | Freq: Every day | ORAL | Status: DC

## 2013-02-15 NOTE — Patient Instructions (Signed)
[facility] Discharge Instructions for Patients Receiving Chemotherapy  Today you received the following chemotherapy agents: Oxaliplatin, Leucovorin, Fluorouracil.  To help prevent nausea and vomiting after your treatment, we encourage you to take your nausea medication, Ondansetron. Take one twice daily for 3 days. Begin the morning of 8/28. Take Compazine every 6 hours as needed for nausea.   If you develop nausea and vomiting that is not controlled by your nausea medication, call the clinic.   BELOW ARE SYMPTOMS THAT SHOULD BE REPORTED IMMEDIATELY:  *FEVER GREATER THAN 100.5 F  *CHILLS WITH OR WITHOUT FEVER  NAUSEA AND VOMITING THAT IS NOT CONTROLLED WITH YOUR NAUSEA MEDICATION  *UNUSUAL SHORTNESS OF BREATH  *UNUSUAL BRUISING OR BLEEDING  TENDERNESS IN MOUTH AND THROAT WITH OR WITHOUT PRESENCE OF ULCERS  *URINARY PROBLEMS  *BOWEL PROBLEMS  UNUSUAL RASH Items with * indicate a potential emergency and should be followed up as soon as possible.  Feel free to call the clinic should you have any questions or concerns. The clinic phone number is [phone].

## 2013-02-15 NOTE — Progress Notes (Signed)
Hematology and Oncology Follow Up Visit  Margaret Elliott [id number] [birth year] 62 y.o. 02/15/2013 2:30 PM   Principle Diagnosis: This is a 62 year old woman diagnosed with colon cancer in June 2014. She had presented with an abdominal pain and found to have a cecal mass resulting in obstruction and advanced appendicitis.  She has at least stage III disease. Likely has stage IV disease with a liver mass.    Prior Therapy: She is status post laparoscopic laparotomy, evacuation of a pelvic abscess and right hemicolectomy with ileocolonic anastomosis done on December 09, 2012. The pathology showed an invasive, well- differentiated colorectal adenocarcinoma. Tumor invades through the muscularis propria, with 2/18 lymph nodes involved, with the pathological staging T3 N1b disease. Her tumor was found to be microsatellite stable. KRAS mutation is pending. She is also status post Port-A-Cath insertion that was done on 01/05/2013.   Current therapy: FOLFOX chemotherapy started 01/18/2013. Avastin was added with cycle 2 on 02/01/2013. SHe is here for her third cycle of chemo today.   Interim History:  Margaret Elliott presents today for a followup visit.She is a very pleasant 62-year with colon cancer as above. Reports sinus pressure and pain. She is having more congestion. No fevers. She also reports gum swelling and pain in her mouth. She is not reporting any abdominal pain. Does report some loose stools but no diarrhea. Does not report any hematochezia. Does not report any melena. Does not report any nausea. Does not report any vomiting. Does not report any pelvic pain. Does not report any bony pain. Her performance status activity level is all back to normal. No neuropathy.   Medications: I have reviewed the patient's current medications.  Current Outpatient Prescriptions  Medication Sig Dispense Refill  . acetaminophen (TYLENOL) 500 MG tablet Take 500 mg by mouth every 6 (six) hours as needed for pain.      .  diphenhydrAMINE (BENADRYL) 25 mg capsule Take 25-50 mg by mouth daily as needed for itching. For allergic reaction      . levofloxacin (LEVAQUIN) 500 MG tablet Take 1 tablet (500 mg total) by mouth daily.  10 tablet  0  . lidocaine-prilocaine (EMLA) cream Apply 1 application topically as needed. Apply approx 1/2 tsp to skin over port, prior to chemotherapy treatments  1 kit  3  . Multiple Vitamin (MULTIVITAMIN) tablet Take 1 tablet by mouth daily.      . ondansetron (ZOFRAN) 8 MG tablet Take 1 tablet (8 mg total) by mouth every 8 (eight) hours as needed for nausea.  30 tablet  1  . prochlorperazine (COMPAZINE) 10 MG tablet Take 1 tablet (10 mg total) by mouth every 6 (six) hours as needed.  60 tablet  0   No current facility-administered medications for this visit.   Facility-Administered Medications Ordered in Other Visits  Medication Dose Route Frequency Provider Last Rate Last Dose  . fluorouracil (ADRUCIL) 4,200 mg in sodium chloride 0.9 % 150 mL chemo infusion  2,400 mg/m2 (Treatment Plan Actual) Intravenous 1 day or 1 dose Benjiman Core, MD      . fluorouracil (ADRUCIL) chemo injection 700 mg  400 mg/m2 (Treatment Plan Actual) Intravenous Once Benjiman Core, MD      . leucovorin 700 mg in dextrose 5 % 250 mL infusion  400 mg/m2 (Treatment Plan Actual) Intravenous Once Benjiman Core, MD 143 mL/hr at 02/15/13 1319 700 mg at 02/15/13 1319  . oxaliplatin (ELOXATIN) 150 mg in dextrose 5 % 500 mL chemo infusion  85 mg/m2 (Treatment Plan Actual) Intravenous Once Benjiman Core, MD 265 mL/hr at 02/15/13 1319 150 mg at 02/15/13 1319     Allergies: No Known Allergies  Past Medical History, Surgical history, Social history, and Family History were reviewed and updated.  Review of Systems:  Remaining ROS negative. Physical Exam:  Blood pressure 126/83, pulse 75, temperature 96.6 F (35.9 C), temperature source Oral, resp. rate 19, height 5\' 4"  (1.626 m), weight 152 lb 12.8 oz (69.31  kg). ECOG: 0 General appearance: alert Head: Normocephalic, without obvious abnormality, atraumatic Neck: no adenopathy, no carotid bruit, no JVD, supple, symmetrical, trachea midline and thyroid not enlarged, symmetric, no tenderness/mass/nodules Lymph nodes: Cervical, supraclavicular, and axillary nodes normal. Heart:regular rate and rhythm, S1, S2 normal, no murmur, click, rub or gallop Lung:chest clear, no wheezing, rales, normal symmetric air entry Abdomen: soft, non-tender, without masses or organomegaly EXT:no erythema, induration, or nodules   Lab Results: Lab Results  Component Value Date   WBC 4.1 02/15/2013   HGB 10.0* 02/15/2013   HCT 32.2* 02/15/2013   MCV 81.3 02/15/2013   PLT 220 02/15/2013     Chemistry      Component Value Date/Time   NA 141 02/15/2013 1059   NA 141 01/04/2013 0837   K 4.6 02/15/2013 1059   K 4.0 01/04/2013 0837   CL 106 01/04/2013 0837   CO2 24 02/15/2013 1059   CO2 25 01/04/2013 0837   BUN 11.9 02/15/2013 1059   BUN 13 01/04/2013 0837   CREATININE 1.0 02/15/2013 1059   CREATININE 0.94 01/04/2013 0837   CREATININE 1.06 09/30/2012 1553      Component Value Date/Time   CALCIUM 9.7 02/15/2013 1059   CALCIUM 9.6 01/04/2013 0837   ALKPHOS 74 02/15/2013 1059   ALKPHOS 86 12/09/2012 1045   AST 19 02/15/2013 1059   AST 16 12/09/2012 1045   ALT 12 02/15/2013 1059   ALT 12 12/09/2012 1045   BILITOT 0.54 02/15/2013 1059   BILITOT 0.3 12/09/2012 1045      Impression and Plan:  62 year old woman with the following issues:  1. Diagnosis of a colon cancer. Presented with a large right cecal mass causing obstruction and abscess and appendicitis. PET/CT results indicate likely stage IV disease. She is here for cycle 3 and will proceed.   2. Intravenous access. PAC in place.  3. Nausea prophylaxis. She has Zofran at home. I have given her Compazine to use at home as well.  4. Sinusitis. Encouraged her to use saline nasal rinses. I have given her a 10 day course of  Levaquin.  5. Swelling of right upper gum. Antibiotics as above. She may need to follow-up with her dentist if this area worsens.  6. Follow up: in two weeks for cycle 4.    [place], [place] 8/27/20142:30 PM

## 2013-02-17 ENCOUNTER — Other Ambulatory Visit: Payer: Self-pay | Admitting: Certified Registered Nurse Anesthetist

## 2013-02-17 ENCOUNTER — Ambulatory Visit (HOSPITAL_BASED_OUTPATIENT_CLINIC_OR_DEPARTMENT_OTHER): Payer: BC Managed Care – PPO

## 2013-02-17 VITALS — BP 125/68 | HR 69 | Temp 97.5°F

## 2013-02-17 DIAGNOSIS — K6389 Other specified diseases of intestine: Secondary | ICD-10-CM

## 2013-02-17 DIAGNOSIS — C189 Malignant neoplasm of colon, unspecified: Secondary | ICD-10-CM

## 2013-02-17 DIAGNOSIS — Z452 Encounter for adjustment and management of vascular access device: Secondary | ICD-10-CM

## 2013-02-17 DIAGNOSIS — K769 Liver disease, unspecified: Secondary | ICD-10-CM

## 2013-02-17 DIAGNOSIS — C180 Malignant neoplasm of cecum: Secondary | ICD-10-CM

## 2013-02-17 MED ORDER — SODIUM CHLORIDE 0.9 % IJ SOLN
10.0000 mL | INTRAMUSCULAR | Status: DC | PRN
  Administered 2013-02-17: 10 mL
  Filled 2013-02-17: qty 10

## 2013-02-17 MED ORDER — HEPARIN SOD (PORK) LOCK FLUSH 100 UNIT/ML IV SOLN
500.0000 [IU] | Freq: Once | INTRAVENOUS | Status: AC | PRN
  Administered 2013-02-17: 500 [IU]
  Filled 2013-02-17: qty 5

## 2013-02-27 ENCOUNTER — Other Ambulatory Visit: Payer: Self-pay | Admitting: Oncology

## 2013-03-01 ENCOUNTER — Telehealth: Payer: Self-pay | Admitting: *Deleted

## 2013-03-01 ENCOUNTER — Other Ambulatory Visit (HOSPITAL_BASED_OUTPATIENT_CLINIC_OR_DEPARTMENT_OTHER): Payer: BC Managed Care – PPO | Admitting: Lab

## 2013-03-01 ENCOUNTER — Telehealth: Payer: Self-pay | Admitting: Oncology

## 2013-03-01 ENCOUNTER — Ambulatory Visit (HOSPITAL_BASED_OUTPATIENT_CLINIC_OR_DEPARTMENT_OTHER): Payer: BC Managed Care – PPO

## 2013-03-01 ENCOUNTER — Ambulatory Visit (HOSPITAL_BASED_OUTPATIENT_CLINIC_OR_DEPARTMENT_OTHER): Payer: BC Managed Care – PPO | Admitting: Oncology

## 2013-03-01 VITALS — BP 131/79 | HR 89 | Temp 97.9°F | Resp 18 | Ht 64.0 in | Wt 154.8 lb

## 2013-03-01 DIAGNOSIS — Z5111 Encounter for antineoplastic chemotherapy: Secondary | ICD-10-CM

## 2013-03-01 DIAGNOSIS — C189 Malignant neoplasm of colon, unspecified: Secondary | ICD-10-CM

## 2013-03-01 DIAGNOSIS — C180 Malignant neoplasm of cecum: Secondary | ICD-10-CM

## 2013-03-01 DIAGNOSIS — K769 Liver disease, unspecified: Secondary | ICD-10-CM

## 2013-03-01 DIAGNOSIS — K6389 Other specified diseases of intestine: Secondary | ICD-10-CM

## 2013-03-01 DIAGNOSIS — Z5112 Encounter for antineoplastic immunotherapy: Secondary | ICD-10-CM

## 2013-03-01 LAB — COMPREHENSIVE METABOLIC PANEL (CC13)
ALT: 9 U/L (ref 0–55)
AST: 17 U/L (ref 5–34)
Albumin: 3.1 g/dL — ABNORMAL LOW (ref 3.5–5.0)
Alkaline Phosphatase: 92 U/L (ref 40–150)
BUN: 18.5 mg/dL (ref 7.0–26.0)
CO2: 24 mEq/L (ref 22–29)
Calcium: 9.3 mg/dL (ref 8.4–10.4)
Chloride: 110 mEq/L — ABNORMAL HIGH (ref 98–109)
Creatinine: 1.1 mg/dL (ref 0.6–1.1)
Glucose: 116 mg/dl (ref 70–140)
Potassium: 4 mEq/L (ref 3.5–5.1)
Sodium: 141 mEq/L (ref 136–145)
Total Bilirubin: 0.53 mg/dL (ref 0.20–1.20)
Total Protein: 7.4 g/dL (ref 6.4–8.3)

## 2013-03-01 LAB — CBC WITH DIFFERENTIAL/PLATELET
BASO%: 0.7 % (ref 0.0–2.0)
EOS%: 1.6 % (ref 0.0–7.0)
MCH: 26.3 pg (ref 25.1–34.0)
MCHC: 32.9 g/dL (ref 31.5–36.0)
MONO#: 0.4 10*3/uL (ref 0.1–0.9)
RBC: 3.9 10*6/uL (ref 3.70–5.45)
WBC: 4.3 10*3/uL (ref 3.9–10.3)
lymph#: 1.7 10*3/uL (ref 0.9–3.3)

## 2013-03-01 MED ORDER — ONDANSETRON 8 MG/NS 50 ML IVPB
INTRAVENOUS | Status: AC
  Filled 2013-03-01: qty 8

## 2013-03-01 MED ORDER — FLUOROURACIL CHEMO INJECTION 2.5 GM/50ML
400.0000 mg/m2 | Freq: Once | INTRAVENOUS | Status: AC
  Administered 2013-03-01: 700 mg via INTRAVENOUS
  Filled 2013-03-01: qty 14

## 2013-03-01 MED ORDER — SODIUM CHLORIDE 0.9 % IV SOLN
2400.0000 mg/m2 | INTRAVENOUS | Status: DC
  Administered 2013-03-01: 4200 mg via INTRAVENOUS
  Filled 2013-03-01: qty 84

## 2013-03-01 MED ORDER — ONDANSETRON 8 MG/50ML IVPB (CHCC)
8.0000 mg | Freq: Once | INTRAVENOUS | Status: AC
  Administered 2013-03-01: 8 mg via INTRAVENOUS

## 2013-03-01 MED ORDER — SODIUM CHLORIDE 0.9 % IV SOLN: Freq: Once | INTRAVENOUS | Status: DC

## 2013-03-01 MED ORDER — LEUCOVORIN CALCIUM INJECTION 350 MG
400.0000 mg/m2 | Freq: Once | INTRAVENOUS | Status: AC
  Administered 2013-03-01: 700 mg via INTRAVENOUS
  Filled 2013-03-01: qty 35

## 2013-03-01 MED ORDER — SODIUM CHLORIDE 0.9 % IV SOLN
Freq: Once | INTRAVENOUS | Status: AC
  Administered 2013-03-01: 10:00:00 via INTRAVENOUS

## 2013-03-01 MED ORDER — DEXAMETHASONE SODIUM PHOSPHATE 10 MG/ML IJ SOLN
INTRAMUSCULAR | Status: AC
  Filled 2013-03-01: qty 1

## 2013-03-01 MED ORDER — OXALIPLATIN CHEMO INJECTION 100 MG/20ML
85.0000 mg/m2 | Freq: Once | INTRAVENOUS | Status: AC
  Administered 2013-03-01: 150 mg via INTRAVENOUS
  Filled 2013-03-01: qty 30

## 2013-03-01 MED ORDER — SODIUM CHLORIDE 0.9 % IV SOLN
5.0000 mg/kg | Freq: Once | INTRAVENOUS | Status: AC
  Administered 2013-03-01: 350 mg via INTRAVENOUS
  Filled 2013-03-01: qty 14

## 2013-03-01 MED ORDER — LEVOFLOXACIN 500 MG PO TABS: 500.0000 mg | ORAL_TABLET | Freq: Every day | ORAL | Status: DC

## 2013-03-01 MED ORDER — DEXAMETHASONE SODIUM PHOSPHATE 10 MG/ML IJ SOLN
10.0000 mg | Freq: Once | INTRAMUSCULAR | Status: AC
  Administered 2013-03-01: 10 mg via INTRAVENOUS

## 2013-03-01 MED ORDER — DEXTROSE 5 % IV SOLN
Freq: Once | INTRAVENOUS | Status: AC
  Administered 2013-03-01: 11:00:00 via INTRAVENOUS

## 2013-03-01 NOTE — Patient Instructions (Addendum)
[facility] Discharge Instructions for Patients Receiving Chemotherapy  Today you received the following chemotherapy agents:  Oxaliplatin, Leucovorin, 5FU and Avastin  To help prevent nausea and vomiting after your treatment, we encourage you to take your nausea medication as ordered per MD.   If you develop nausea and vomiting that is not controlled by your nausea medication, call the clinic.   BELOW ARE SYMPTOMS THAT SHOULD BE REPORTED IMMEDIATELY:  *FEVER GREATER THAN 100.5 F  *CHILLS WITH OR WITHOUT FEVER  NAUSEA AND VOMITING THAT IS NOT CONTROLLED WITH YOUR NAUSEA MEDICATION  *UNUSUAL SHORTNESS OF BREATH  *UNUSUAL BRUISING OR BLEEDING  TENDERNESS IN MOUTH AND THROAT WITH OR WITHOUT PRESENCE OF ULCERS  *URINARY PROBLEMS  *BOWEL PROBLEMS  UNUSUAL RASH Items with * indicate a potential emergency and should be followed up as soon as possible.  Feel free to call the clinic you have any questions or concerns. The clinic phone number is [phone].    

## 2013-03-01 NOTE — Progress Notes (Signed)
Hematology and Oncology Follow Up Visit  Margaret Elliott [id number] 1950-06-27 61 y.o. 02/01/2013 12:09 PM   Principle Diagnosis: This is a 62 year old woman diagnosed with colon cancer in June 2014. She had presented with an abdominal pain and found to have a cecal mass resulting in obstruction and advanced appendicitis.  She has at least stage III disease. Likely has stage IV disease with a liver mass.    Prior Therapy: She is status post laparoscopic laparotomy, evacuation of a pelvic abscess and right hemicolectomy with ileocolonic anastomosis done on December 09, 2012. The pathology showed an invasive, well- differentiated colorectal adenocarcinoma. Tumor invades through the muscularis propria, with 2/18 lymph nodes involved, with the pathological staging T3 N1b disease. Her tumor was found to be microsatellite stable. KRAS mutation is pending. She is also status post Port-A-Cath insertion that was done on 01/05/2013.   Current therapy: FOLFOX chemotherapy started 01/18/2013. Avastin to be added with cycle 2 on 02/01/2013. She is here for cycle 4.   Interim History:  [name] presents today for a followup visit. She is a very pleasant 62-year with colon cancer as above. Tolerated the first 3 cycles of chemotherapy well. She is not reporting any abdominal pain. Does report some loose stools but no diarrhea. Does not report any hematochezia. Does not report any melena. Does not report any nausea. Does not report any vomiting. Does not report any pelvic pain. Does not report any bony pain. Her performance status activity level is all back to normal. She is reporting grade one sensory neuropathy.    Medications: I have reviewed the patient's current medications.  Current Outpatient Prescriptions  Medication Sig Dispense Refill  . acetaminophen (TYLENOL) 500 MG tablet Take 500 mg by mouth every 6 (six) hours as needed for pain.      . diphenhydrAMINE (BENADRYL) 25 mg capsule Take 25-50 mg by mouth  daily as needed for itching. For allergic reaction      . lidocaine-prilocaine (EMLA) cream Apply 1 application topically as needed. Apply approx 1/2 tsp to skin over port, prior to chemotherapy treatments  1 kit  3  . Multiple Vitamin (MULTIVITAMIN) tablet Take 1 tablet by mouth daily.      . ondansetron (ZOFRAN) 8 MG tablet Take 1 tablet (8 mg total) by mouth every 8 (eight) hours as needed for nausea.  30 tablet  1   No current facility-administered medications for this visit.     Allergies: No Known Allergies  Past Medical History, Surgical history, Social history, and Family History were reviewed and updated.  Review of Systems:  Remaining ROS negative. Physical Exam:  Blood pressure 119/75, pulse 98, temperature 97.7 F (36.5 C), temperature source Oral, resp. rate 18, height 5\' 4"  (1.626 m), weight 152 lb (68.947 kg). ECOG: 0 General appearance: alert Head: Normocephalic, without obvious abnormality, atraumatic Neck: no adenopathy, no carotid bruit, no JVD, supple, symmetrical, trachea midline and thyroid not enlarged, symmetric, no tenderness/mass/nodules Lymph nodes: Cervical, supraclavicular, and axillary nodes normal. Heart:regular rate and rhythm, S1, S2 normal, no murmur, click, rub or gallop Lung:chest clear, no wheezing, rales, normal symmetric air entry Abdomen: soft, non-tender, without masses or organomegaly EXT:no erythema, induration, or nodules   Lab Results: Lab Results  Component Value Date   WBC 4.1 02/01/2013   HGB 9.9* 02/01/2013   HCT 30.5* 02/01/2013   MCV 80.1 02/01/2013   PLT 282 02/01/2013     Chemistry      Component Value Date/Time   NA  142 02/01/2013 1021   NA 141 01/04/2013 0837   K 4.4 02/01/2013 1021   K 4.0 01/04/2013 0837   CL 106 01/04/2013 0837   CO2 25 02/01/2013 1021   CO2 25 01/04/2013 0837   BUN 15.9 02/01/2013 1021   BUN 13 01/04/2013 0837   CREATININE 1.2* 02/01/2013 1021   CREATININE 0.94 01/04/2013 0837   CREATININE 1.06 09/30/2012  1553      Component Value Date/Time   CALCIUM 9.7 02/01/2013 1021   CALCIUM 9.6 01/04/2013 0837   ALKPHOS 87 02/01/2013 1021   ALKPHOS 86 12/09/2012 1045   AST 15 02/01/2013 1021   AST 16 12/09/2012 1045   ALT 8 02/01/2013 1021   ALT 12 12/09/2012 1045   BILITOT 0.57 02/01/2013 1021   BILITOT 0.3 12/09/2012 1045      Impression and Plan:  62 year old woman with the following issues:  1. Advanced Colon cancer. She presented with a large right cecal mass causing obstruction and abscess and appendicitis. PET/CT results indicate likely stage IV disease. She is here for cycle 4 and will proceed with any dose reduction or delay. I will obtain CT scan before cycle 4.   2. Intravenous access. PAC in place.  3. Nausea prophylaxis. She has Zofran at home.  4. Follow up: in two weeks for cycle 5.    [facility] MD 03/01/2013

## 2013-03-01 NOTE — Telephone Encounter (Signed)
Gave pt appt for lab, MD and chemo for September and October , emailed [place] regarding chemo appts, gave pt oral contrast

## 2013-03-01 NOTE — Telephone Encounter (Signed)
Called pt left message for pt to get appt for September lab, Md and chemo , llab will be drawn after CT on September then see Md next day

## 2013-03-01 NOTE — Telephone Encounter (Signed)
Per staff message and POF I have scheduled appts.  JMW  

## 2013-03-03 ENCOUNTER — Other Ambulatory Visit: Payer: Self-pay | Admitting: Certified Registered Nurse Anesthetist

## 2013-03-03 ENCOUNTER — Ambulatory Visit (HOSPITAL_BASED_OUTPATIENT_CLINIC_OR_DEPARTMENT_OTHER): Payer: BC Managed Care – PPO

## 2013-03-03 VITALS — BP 129/69 | HR 65 | Temp 97.9°F

## 2013-03-03 DIAGNOSIS — K7689 Other specified diseases of liver: Secondary | ICD-10-CM

## 2013-03-03 DIAGNOSIS — K769 Liver disease, unspecified: Secondary | ICD-10-CM

## 2013-03-03 DIAGNOSIS — K6389 Other specified diseases of intestine: Secondary | ICD-10-CM

## 2013-03-03 DIAGNOSIS — C189 Malignant neoplasm of colon, unspecified: Secondary | ICD-10-CM

## 2013-03-03 MED ORDER — SODIUM CHLORIDE 0.9 % IJ SOLN
10.0000 mL | INTRAMUSCULAR | Status: DC | PRN
  Administered 2013-03-03: 10 mL
  Filled 2013-03-03: qty 10

## 2013-03-03 MED ORDER — HEPARIN SOD (PORK) LOCK FLUSH 100 UNIT/ML IV SOLN
500.0000 [IU] | Freq: Once | INTRAVENOUS | Status: AC | PRN
  Administered 2013-03-03: 500 [IU]
  Filled 2013-03-03: qty 5

## 2013-03-10 ENCOUNTER — Telehealth: Payer: Self-pay | Admitting: *Deleted

## 2013-03-10 ENCOUNTER — Other Ambulatory Visit: Payer: Self-pay | Admitting: Oncology

## 2013-03-10 NOTE — Telephone Encounter (Signed)
Returned call to report the swollen, red, sore area right upper gum has not gotten any better and is requesting another refill on levofloxacin. She has not contacted her dentist-was told that community dentists prefer not to work on chemo patients. No fever or drainage from area.

## 2013-03-10 NOTE — Telephone Encounter (Signed)
Made patient aware that Dr. Clelia Croft wants to see her next week before refilling the antibiotic. Encouraged her to continue her rinses and orajel.

## 2013-03-10 NOTE — Telephone Encounter (Signed)
Left VM for patient to call to discuss need for refill. Has completed #2 courses of levofloxacin 8/27 & 9/10

## 2013-03-10 NOTE — Telephone Encounter (Signed)
Lm made the pt aware that her arrival time for 03/14/13 had changed. gv appt for 03/14/13 @ 10:15am...td

## 2013-03-13 ENCOUNTER — Other Ambulatory Visit (HOSPITAL_BASED_OUTPATIENT_CLINIC_OR_DEPARTMENT_OTHER): Payer: BC Managed Care – PPO

## 2013-03-13 ENCOUNTER — Encounter (HOSPITAL_COMMUNITY): Payer: Self-pay

## 2013-03-13 ENCOUNTER — Ambulatory Visit (HOSPITAL_COMMUNITY)
Admission: RE | Admit: 2013-03-13 | Discharge: 2013-03-13 | Disposition: A | Discharge status: Home / Self Care | Payer: BC Managed Care – PPO | Source: Ambulatory Visit | Attending: Oncology | Admitting: Oncology

## 2013-03-13 ENCOUNTER — Ambulatory Visit (HOSPITAL_BASED_OUTPATIENT_CLINIC_OR_DEPARTMENT_OTHER): Payer: BC Managed Care – PPO

## 2013-03-13 ENCOUNTER — Other Ambulatory Visit: Payer: BC Managed Care – PPO | Admitting: Lab

## 2013-03-13 VITALS — BP 176/90 | HR 74 | Temp 97.1°F

## 2013-03-13 DIAGNOSIS — M51379 Other intervertebral disc degeneration, lumbosacral region without mention of lumbar back pain or lower extremity pain: Secondary | ICD-10-CM | POA: Insufficient documentation

## 2013-03-13 DIAGNOSIS — C189 Malignant neoplasm of colon, unspecified: Secondary | ICD-10-CM

## 2013-03-13 DIAGNOSIS — R911 Solitary pulmonary nodule: Secondary | ICD-10-CM | POA: Insufficient documentation

## 2013-03-13 DIAGNOSIS — E279 Disorder of adrenal gland, unspecified: Secondary | ICD-10-CM | POA: Insufficient documentation

## 2013-03-13 DIAGNOSIS — N7013 Chronic salpingitis and oophoritis: Secondary | ICD-10-CM | POA: Insufficient documentation

## 2013-03-13 DIAGNOSIS — M5137 Other intervertebral disc degeneration, lumbosacral region: Secondary | ICD-10-CM | POA: Insufficient documentation

## 2013-03-13 DIAGNOSIS — M47817 Spondylosis without myelopathy or radiculopathy, lumbosacral region: Secondary | ICD-10-CM | POA: Insufficient documentation

## 2013-03-13 DIAGNOSIS — K7689 Other specified diseases of liver: Secondary | ICD-10-CM | POA: Insufficient documentation

## 2013-03-13 LAB — COMPREHENSIVE METABOLIC PANEL (CC13)
ALT: 10 U/L (ref 0–55)
AST: 20 U/L (ref 5–34)
Albumin: 3.2 g/dL — ABNORMAL LOW (ref 3.5–5.0)
BUN: 14 mg/dL (ref 7.0–26.0)
Calcium: 9.5 mg/dL (ref 8.4–10.4)
Chloride: 107 mEq/L (ref 98–109)
Creatinine: 1 mg/dL (ref 0.6–1.1)
Glucose: 87 mg/dl (ref 70–140)
Potassium: 3.9 mEq/L (ref 3.5–5.1)
Total Bilirubin: 0.67 mg/dL (ref 0.20–1.20)

## 2013-03-13 LAB — CBC WITH DIFFERENTIAL/PLATELET
BASO%: 0.2 % (ref 0.0–2.0)
Eosinophils Absolute: 0.1 10*3/uL (ref 0.0–0.5)
HCT: 30.9 % — ABNORMAL LOW (ref 34.8–46.6)
HGB: 10 g/dL — ABNORMAL LOW (ref 11.6–15.9)
LYMPH%: 39.3 % (ref 14.0–49.7)
MCHC: 32.4 g/dL (ref 31.5–36.0)
MCV: 79.8 fL (ref 79.5–101.0)
MONO#: 0.7 10*3/uL (ref 0.1–0.9)
NEUT#: 1.7 10*3/uL (ref 1.5–6.5)
NEUT%: 42.6 % (ref 38.4–76.8)
WBC: 4.1 10*3/uL (ref 3.9–10.3)
lymph#: 1.6 10*3/uL (ref 0.9–3.3)
nRBC: 0 % (ref 0–0)

## 2013-03-13 LAB — CEA: CEA: 1.7 ng/mL (ref 0.0–5.0)

## 2013-03-13 MED ORDER — HEPARIN SOD (PORK) LOCK FLUSH 100 UNIT/ML IV SOLN
500.0000 [IU] | Freq: Once | INTRAVENOUS | Status: AC
  Administered 2013-03-13: 500 [IU] via INTRAVENOUS
  Filled 2013-03-13: qty 5

## 2013-03-13 MED ORDER — SODIUM CHLORIDE 0.9 % IJ SOLN
10.0000 mL | INTRAMUSCULAR | Status: DC | PRN
  Administered 2013-03-13: 10 mL via INTRAVENOUS
  Filled 2013-03-13: qty 10

## 2013-03-13 MED ORDER — IOHEXOL 300 MG/ML  SOLN
100.0000 mL | Freq: Once | INTRAMUSCULAR | Status: AC | PRN
  Administered 2013-03-13: 100 mL via INTRAVENOUS

## 2013-03-13 NOTE — Patient Instructions (Addendum)
Implanted Port Instructions  An implanted port is a central line that has a round shape and is placed under the skin. It is used for long-term IV (intravenous) access for:  · Medicine.  · Fluids.  · Liquid nutrition, such as TPN (total parenteral nutrition).  · Blood samples.  Ports can be placed:  · In the chest area just below the collarbone (this is the most common place.)  · In the arms.  · In the belly (abdomen) area.  · In the legs.  PARTS OF THE PORT  A port has 2 main parts:  · The reservoir. The reservoir is round, disc-shaped, and will be a small, raised area under your skin.  · The reservoir is the part where a needle is inserted (accessed) to either give medicines or to draw blood.  · The catheter. The catheter is a long, slender tube that extends from the reservoir. The catheter is placed into a large vein.  · Medicine that is inserted into the reservoir goes into the catheter and then into the vein.  INSERTION OF THE PORT  · The port is surgically placed in either an operating room or in a procedural area (interventional radiology).  · Medicine may be given to help you relax during the procedure.  · The skin where the port will be inserted is numbed (local anesthetic).  · 1 or 2 small cuts (incisions) will be made in the skin to insert the port.  · The port can be used after it has been inserted.  INCISION SITE CARE  · The incision site may have small adhesive strips on it. This helps keep the incision site closed. Sometimes, no adhesive strips are placed. Instead of adhesive strips, a special kind of surgical glue is used to keep the incision closed.  · If adhesive strips were placed on the incision sites, do not take them off. They will fall off on their own.  · The incision site may be sore for 1 to 2 days. Pain medicine can help.  · Do not get the incision site wet. Bathe or shower as directed by your caregiver.  · The incision site should heal in 5 to 7 days. A small scar may form after the  incision has healed.  ACCESSING THE PORT  Special steps must be taken to access the port:  · Before the port is accessed, a numbing cream can be placed on the skin. This helps numb the skin over the port site.  · A sterile technique is used to access the port.  · The port is accessed with a needle. Only "non-coring" port needles should be used to access the port. Once the port is accessed, a blood return should be checked. This helps ensure the port is in the vein and is not clogged (clotted).  · If your caregiver believes your port should remain accessed, a clear (transparent) bandage will be placed over the needle site. The bandage and needle will need to be changed every week or as directed by your caregiver.  · Keep the bandage covering the needle clean and dry. Do not get it wet. Follow your caregiver's instructions on how to take a shower or bath when the port is accessed.  · If your port does not need to stay accessed, no bandage is needed over the port.  FLUSHING THE PORT  Flushing the port keeps it from getting clogged. How often the port is flushed depends on:  · If a   constant infusion is running. If a constant infusion is running, the port may not need to be flushed.  · If intermittent medicines are given.  · If the port is not being used.  For intermittent medicines:  · The port will need to be flushed:  · After medicines have been given.  · After blood has been drawn.  · As part of routine maintenance.  · A port is normally flushed with:  · Normal saline.  · Heparin.  · Follow your caregiver's advice on how often, how much, and the type of flush to use on your port.  IMPORTANT PORT INFORMATION  · Tell your caregiver if you are allergic to heparin.  · After your port is placed, you will get a manufacturer's information card. The card has information about your port. Keep this card with you at all times.  · There are many types of ports available. Know what kind of port you have.  · In case of an  emergency, it may be helpful to wear a medical alert bracelet. This can help alert health care workers that you have a port.  · The port can stay in for as long as your caregiver believes it is necessary.  · When it is time for the port to come out, surgery will be done to remove it. The surgery will be similar to how the port was put in.  · If you are in the hospital or clinic:  · Your port will be taken care of and flushed by a nurse.  · If you are at home:  · A home health care nurse may give medicines and take care of the port.  · You or a family member can get special training and directions for giving medicine and taking care of the port at home.  SEEK IMMEDIATE MEDICAL CARE IF:   · Your port does not flush or you are unable to get a blood return.  · New drainage or pus is coming from the incision.  · A bad smell is coming from the incision site.  · You develop swelling or increased redness at the incision site.  · You develop increased swelling or pain at the port site.  · You develop swelling or pain in the surrounding skin near the port.  · You have an oral temperature above 102° F (38.9° C), not controlled by medicine.  MAKE SURE YOU:   · Understand these instructions.  · Will watch your condition.  · Will get help right away if you are not doing well or get worse.  Document Released: 06/08/2005 Document Revised: 08/31/2011 Document Reviewed: 08/30/2008  [employer]® Patient Information ©2014 [employer].

## 2013-03-14 ENCOUNTER — Ambulatory Visit (HOSPITAL_BASED_OUTPATIENT_CLINIC_OR_DEPARTMENT_OTHER): Payer: BC Managed Care – PPO | Admitting: Oncology

## 2013-03-14 ENCOUNTER — Telehealth: Payer: Self-pay | Admitting: *Deleted

## 2013-03-14 ENCOUNTER — Telehealth: Payer: Self-pay | Admitting: Oncology

## 2013-03-14 VITALS — BP 148/88 | HR 78 | Temp 97.7°F | Resp 20 | Ht 64.0 in | Wt 156.7 lb

## 2013-03-14 DIAGNOSIS — C180 Malignant neoplasm of cecum: Secondary | ICD-10-CM

## 2013-03-14 DIAGNOSIS — G620 Drug-induced polyneuropathy: Secondary | ICD-10-CM

## 2013-03-14 DIAGNOSIS — K769 Liver disease, unspecified: Secondary | ICD-10-CM

## 2013-03-14 DIAGNOSIS — C189 Malignant neoplasm of colon, unspecified: Secondary | ICD-10-CM

## 2013-03-14 MED ORDER — LEVOFLOXACIN 500 MG PO TABS: 500.0000 mg | ORAL_TABLET | Freq: Every day | ORAL | Status: DC

## 2013-03-14 NOTE — Telephone Encounter (Signed)
Gave pt appt for lab, md and chemo October and September 2014

## 2013-03-14 NOTE — Progress Notes (Signed)
Hematology and Oncology Follow Up Visit  Margaret Elliott [id number] August 04, 1950 62 y.o. 02/01/2013 12:09 PM   Principle Diagnosis: This is a 62 year old woman diagnosed with colon cancer in June 2014. She had presented with an abdominal pain and found to have a cecal mass resulting in obstruction and advanced appendicitis.  She has at least stage III disease. Likely has stage IV disease with a liver mass.    Prior Therapy: She is status post laparoscopic laparotomy, evacuation of a pelvic abscess and right hemicolectomy with ileocolonic anastomosis done on December 09, 2012. The pathology showed an invasive, well- differentiated colorectal adenocarcinoma. Tumor invades through the muscularis propria, with 2/18 lymph nodes involved, with the pathological staging T3 N1b disease. Her tumor was found to be microsatellite stable. KRAS mutation is pending. She is also status post Port-A-Cath insertion that was done on 01/05/2013.   Current therapy: FOLFOX chemotherapy started 01/18/2013. Avastin to be added with cycle 2 on 02/01/2013. She is here for an evaluation before cycle 5.   Interim History:  Margaret Elliott presents today for a followup visit. She is a very pleasant 62-year with colon cancer as above. Tolerated the first 4 cycles of chemotherapy well. She is not reporting any abdominal pain. She does not report any diarrhea. Does not report any hematochezia. Does not report any melena. Does not report any nausea. Does not report any vomiting. Does not report any pelvic pain. Does not report any bony pain. Her performance status activity level is all back to normal. She is reporting grade one motor neuropathy. She has reported a gum pain and infection and improved with Levaquin but is back .    Medications: I have reviewed the patient's current medications.  Current Outpatient Prescriptions  Medication Sig Dispense Refill  . acetaminophen (TYLENOL) 500 MG tablet Take 500 mg by mouth every 6 (six) hours as  needed for pain.      . diphenhydrAMINE (BENADRYL) 25 mg capsule Take 25-50 mg by mouth daily as needed for itching. For allergic reaction      . lidocaine-prilocaine (EMLA) cream Apply 1 application topically as needed. Apply approx 1/2 tsp to skin over port, prior to chemotherapy treatments  1 kit  3  . Multiple Vitamin (MULTIVITAMIN) tablet Take 1 tablet by mouth daily.      . ondansetron (ZOFRAN) 8 MG tablet Take 1 tablet (8 mg total) by mouth every 8 (eight) hours as needed for nausea.  30 tablet  1   No current facility-administered medications for this visit.     Allergies: No Known Allergies  Past Medical History, Surgical history, Social history, and Family History were reviewed and updated.  Review of Systems:  Remaining ROS negative. Physical Exam:  Blood pressure 119/75, pulse 98, temperature 97.7 F (36.5 C), temperature source Oral, resp. rate 18, height 5\' 4"  (1.626 m), weight 152 lb (68.947 kg). ECOG: 0 General appearance: alert Head: Normocephalic, without obvious abnormality, atraumatic Neck: no adenopathy, no carotid bruit, no JVD, supple, symmetrical, trachea midline and thyroid not enlarged, symmetric, no tenderness/mass/nodules Lymph nodes: Cervical, supraclavicular, and axillary nodes normal. Heart:regular rate and rhythm, S1, S2 normal, no murmur, click, rub or gallop Lung:chest clear, no wheezing, rales, normal symmetric air entry Abdomen: soft, non-tender, without masses or organomegaly EXT:no erythema, induration, or nodules Neurological: No deficits noted. Gait and mood are stable.   Lab Results: Lab Results  Component Value Date   WBC 4.1 02/01/2013   HGB 9.9* 02/01/2013   HCT 30.5* 02/01/2013  MCV 80.1 02/01/2013   PLT 282 02/01/2013     Chemistry      Component Value Date/Time   NA 142 02/01/2013 1021   NA 141 01/04/2013 0837   K 4.4 02/01/2013 1021   K 4.0 01/04/2013 0837   CL 106 01/04/2013 0837   CO2 25 02/01/2013 1021   CO2 25 01/04/2013 0837    BUN 15.9 02/01/2013 1021   BUN 13 01/04/2013 0837   CREATININE 1.2* 02/01/2013 1021   CREATININE 0.94 01/04/2013 0837   CREATININE 1.06 09/30/2012 1553      Component Value Date/Time   CALCIUM 9.7 02/01/2013 1021   CALCIUM 9.6 01/04/2013 0837   ALKPHOS 87 02/01/2013 1021   ALKPHOS 86 12/09/2012 1045   AST 15 02/01/2013 1021   AST 16 12/09/2012 1045   ALT 8 02/01/2013 1021   ALT 12 12/09/2012 1045   BILITOT 0.57 02/01/2013 1021   BILITOT 0.3 12/09/2012 1045     EXAM:  CT CHEST, ABDOMEN, AND PELVIS WITH CONTRAST  TECHNIQUE:  Multidetector CT imaging of the chest, abdomen and pelvis was  performed following the standard protocol during bolus  administration of intravenous contrast.  CONTRAST: OMNIPAQUE IOHEXOL 300 MG/ML SOLN  COMPARISON: Multiple exams, including 01/16/2013 and 12/09/2012  FINDINGS:  CT CHEST FINDINGS  High density linear anterior mediastinal tissue is stable on was not  hypermetabolic on prior PET-CT. This may reflect faint calcification  along thymic tissue or prior inflammation.  No pathologic thoracic adenopathy. A 0.4 cm nodule was present  anteriorly in the left upper lobe on image 29 of series 4.  CT ABDOMEN AND PELVIS FINDINGS  The segment 7 lesion in the liver shown on prior exams is in  conspicuous today and accordingly difficult to measure, but is  estimated at 1.2 cm in diameter, similar to prior. No definite new  liver lesion is identified.  8 mm hypodensity anteriorly in the lateral spleen the on image 53 of  series 2 is stable but was not previously hypermetabolic.  The pancreas and adrenal glands appear normal. Bosniak category 1  right mid kidney cyst, stable. Gallbladder contracted with resulting  thick walled.  Partial right hemicolectomy noted. No residual local mass observed.  Serpentine 4.9 x 3.2 cm fluid density structure adjacent to the  right ovary is suspicious for hydrosalpinx. Uterus absent. Urinary  bladder normal.  Laparotomy scar  noted ; linear density in the scar on images 81  through 82 likely represent suture material.  The body of the sternum is segmented.  There is loss of intervertebral disc height at L5-S1 with associated  spurring and facet arthropathy both at L4-5 and L5-S1. There is mild  grade 1 anterolisthesis at L4-5.  IMPRESSION:  CT CHEST IMPRESSION  1. 4 mm nodule in the left upper lobe anteriorly. This was not  previously hypermetabolic but is below a reliable PET-CT size  thresholds. In light of the patient's history of cancer, Fleischner  criteria do not apply. Followup CT in 3-6 months time is recommended  to assess stability.  CT ABDOMEN AND PELVIS IMPRESSION  1. The indistinct 1.2 cm lesion in segment 7 of the liver is  essentially stable compared to the prior CT scan.  2. Small lateral splenic hypodensity is likely a hemangioma or  similar benign lesion, and was not previously hypermetabolic, but  may merit observation.  3. Right hydrosalpinx.  4. No new mass is identified.  5. Lower lumbar spondylosis and degenerative disc disease.  Impression and Plan:  62 year old woman with the following issues:  1. Advanced Colon cancer. She presented with a large right cecal mass causing obstruction and abscess and appendicitis. PET/CT results indicate likely stage IV disease. She is here for cycle 5 and will proceed with any dose reduction or delay.  CT scan from 03/13/2013 was discussed today and showed mostly stable disease.   2. Intravenous access. PAC in place.  3. Nausea prophylaxis. She has Zofran at home.  4. 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 I have decreased her oxaliplatin dose by 25%.  5. Abdominal protrusions/abscess: I have given her a prescription for Levaquin with instructions to see a dental surgeon if it does not improve in the next 2 weeks.  4. Follow up: in two weeks for cycle 6.    [facility] MD 03/14/2013

## 2013-03-14 NOTE — Telephone Encounter (Signed)
Per staff message and POF I have scheduled appts.  JMW  

## 2013-03-15 ENCOUNTER — Ambulatory Visit (HOSPITAL_BASED_OUTPATIENT_CLINIC_OR_DEPARTMENT_OTHER): Payer: BC Managed Care – PPO

## 2013-03-15 VITALS — BP 134/88 | HR 69 | Temp 98.1°F | Resp 19 | Ht 64.0 in

## 2013-03-15 DIAGNOSIS — Z5111 Encounter for antineoplastic chemotherapy: Secondary | ICD-10-CM

## 2013-03-15 DIAGNOSIS — C189 Malignant neoplasm of colon, unspecified: Secondary | ICD-10-CM

## 2013-03-15 DIAGNOSIS — K769 Liver disease, unspecified: Secondary | ICD-10-CM

## 2013-03-15 DIAGNOSIS — C180 Malignant neoplasm of cecum: Secondary | ICD-10-CM

## 2013-03-15 DIAGNOSIS — Z5112 Encounter for antineoplastic immunotherapy: Secondary | ICD-10-CM

## 2013-03-15 DIAGNOSIS — K6389 Other specified diseases of intestine: Secondary | ICD-10-CM

## 2013-03-15 LAB — POCT URINE QUALITATIVE DIPSTICK PROTEIN

## 2013-03-15 MED ORDER — SODIUM CHLORIDE 0.9 % IV SOLN
Freq: Once | INTRAVENOUS | Status: AC
  Administered 2013-03-15: 09:00:00 via INTRAVENOUS

## 2013-03-15 MED ORDER — ONDANSETRON 8 MG/50ML IVPB (CHCC)
8.0000 mg | Freq: Once | INTRAVENOUS | Status: AC
  Administered 2013-03-15: 8 mg via INTRAVENOUS

## 2013-03-15 MED ORDER — DEXAMETHASONE SODIUM PHOSPHATE 10 MG/ML IJ SOLN
10.0000 mg | Freq: Once | INTRAMUSCULAR | Status: AC
  Administered 2013-03-15: 10 mg via INTRAVENOUS

## 2013-03-15 MED ORDER — OXALIPLATIN CHEMO INJECTION 100 MG/20ML
64.0000 mg/m2 | Freq: Once | INTRAVENOUS | Status: AC
  Administered 2013-03-15: 110 mg via INTRAVENOUS
  Filled 2013-03-15: qty 22

## 2013-03-15 MED ORDER — ONDANSETRON 8 MG/NS 50 ML IVPB
INTRAVENOUS | Status: AC
  Filled 2013-03-15: qty 8

## 2013-03-15 MED ORDER — SODIUM CHLORIDE 0.9 % IV SOLN
2400.0000 mg/m2 | INTRAVENOUS | Status: DC
  Administered 2013-03-15: 4200 mg via INTRAVENOUS
  Filled 2013-03-15: qty 84

## 2013-03-15 MED ORDER — FLUOROURACIL CHEMO INJECTION 2.5 GM/50ML
400.0000 mg/m2 | Freq: Once | INTRAVENOUS | Status: AC
  Administered 2013-03-15: 700 mg via INTRAVENOUS
  Filled 2013-03-15: qty 14

## 2013-03-15 MED ORDER — DEXTROSE 5 % IV SOLN
Freq: Once | INTRAVENOUS | Status: AC
  Administered 2013-03-15: 11:00:00 via INTRAVENOUS

## 2013-03-15 MED ORDER — SODIUM CHLORIDE 0.9 % IV SOLN
5.0000 mg/kg | Freq: Once | INTRAVENOUS | Status: AC
  Administered 2013-03-15: 350 mg via INTRAVENOUS
  Filled 2013-03-15: qty 14

## 2013-03-15 MED ORDER — DEXAMETHASONE SODIUM PHOSPHATE 10 MG/ML IJ SOLN
INTRAMUSCULAR | Status: AC
  Filled 2013-03-15: qty 1

## 2013-03-15 MED ORDER — LEUCOVORIN CALCIUM INJECTION 350 MG
400.0000 mg/m2 | Freq: Once | INTRAVENOUS | Status: AC
  Administered 2013-03-15: 700 mg via INTRAVENOUS
  Filled 2013-03-15: qty 35

## 2013-03-15 NOTE — Patient Instructions (Addendum)
[facility] Discharge Instructions for Patients Receiving Chemotherapy  Today you received the following chemotherapy agents Avastin, Leucovorin, Oxaliplatin, 5-FU  To help prevent nausea and vomiting after your treatment, we encourage you to take your nausea medication as directed.   If you develop nausea and vomiting that is not controlled by your nausea medication, call the clinic.   BELOW ARE SYMPTOMS THAT SHOULD BE REPORTED IMMEDIATELY:  *FEVER GREATER THAN 100.5 F  *CHILLS WITH OR WITHOUT FEVER  NAUSEA AND VOMITING THAT IS NOT CONTROLLED WITH YOUR NAUSEA MEDICATION  *UNUSUAL SHORTNESS OF BREATH  *UNUSUAL BRUISING OR BLEEDING  TENDERNESS IN MOUTH AND THROAT WITH OR WITHOUT PRESENCE OF ULCERS  *URINARY PROBLEMS  *BOWEL PROBLEMS  UNUSUAL RASH Items with * indicate a potential emergency and should be followed up as soon as possible.  Feel free to call the clinic you have any questions or concerns. The clinic phone number is [phone].

## 2013-03-15 NOTE — Progress Notes (Signed)
Urine protein dipstix result =1+ ( 30) protein.

## 2013-03-17 ENCOUNTER — Ambulatory Visit (HOSPITAL_BASED_OUTPATIENT_CLINIC_OR_DEPARTMENT_OTHER): Payer: BC Managed Care – PPO

## 2013-03-17 VITALS — BP 145/77 | HR 78 | Temp 98.2°F

## 2013-03-17 DIAGNOSIS — C180 Malignant neoplasm of cecum: Secondary | ICD-10-CM

## 2013-03-17 DIAGNOSIS — C189 Malignant neoplasm of colon, unspecified: Secondary | ICD-10-CM

## 2013-03-17 DIAGNOSIS — Z452 Encounter for adjustment and management of vascular access device: Secondary | ICD-10-CM

## 2013-03-17 DIAGNOSIS — K6389 Other specified diseases of intestine: Secondary | ICD-10-CM

## 2013-03-17 DIAGNOSIS — K769 Liver disease, unspecified: Secondary | ICD-10-CM

## 2013-03-17 MED ORDER — HEPARIN SOD (PORK) LOCK FLUSH 100 UNIT/ML IV SOLN
500.0000 [IU] | Freq: Once | INTRAVENOUS | Status: AC | PRN
  Administered 2013-03-17: 500 [IU]
  Filled 2013-03-17: qty 5

## 2013-03-17 MED ORDER — SODIUM CHLORIDE 0.9 % IJ SOLN
10.0000 mL | INTRAMUSCULAR | Status: DC | PRN
  Administered 2013-03-17: 10 mL
  Filled 2013-03-17: qty 10

## 2013-03-20 ENCOUNTER — Other Ambulatory Visit: Payer: Self-pay | Admitting: Certified Registered Nurse Anesthetist

## 2013-03-24 ENCOUNTER — Telehealth: Payer: Self-pay | Admitting: *Deleted

## 2013-03-24 NOTE — Telephone Encounter (Signed)
Patient calling to say she is having nose bleeds. this morning with her bowel movement, the bowl was full of blood.she is still on antibiotics for a sore on her gum and now her tongue is sore and has red on the tip. She wants to know if she should stop the antibiotics. States she had a colonoscopy in may with dr Arlyce Dice @ [facility]

## 2013-03-24 NOTE — Telephone Encounter (Signed)
Spoke with patient, per dr Clelia Croft, stop the antibiotics. She has not taken any today and only has 3 pills left. States, she has not had any bleeding since early this am, feels better now. Encouraged her to call back if any more issues. Patient verbalizes understanding.

## 2013-03-29 ENCOUNTER — Other Ambulatory Visit (HOSPITAL_BASED_OUTPATIENT_CLINIC_OR_DEPARTMENT_OTHER): Payer: BC Managed Care – PPO | Admitting: Lab

## 2013-03-29 ENCOUNTER — Ambulatory Visit (HOSPITAL_BASED_OUTPATIENT_CLINIC_OR_DEPARTMENT_OTHER): Payer: BC Managed Care – PPO

## 2013-03-29 ENCOUNTER — Telehealth: Payer: Self-pay | Admitting: *Deleted

## 2013-03-29 ENCOUNTER — Telehealth: Payer: Self-pay | Admitting: Oncology

## 2013-03-29 ENCOUNTER — Ambulatory Visit (HOSPITAL_BASED_OUTPATIENT_CLINIC_OR_DEPARTMENT_OTHER): Payer: BC Managed Care – PPO | Admitting: Oncology

## 2013-03-29 VITALS — BP 150/87 | HR 88 | Temp 97.8°F | Resp 20 | Ht 64.0 in | Wt 155.6 lb

## 2013-03-29 DIAGNOSIS — C180 Malignant neoplasm of cecum: Secondary | ICD-10-CM

## 2013-03-29 DIAGNOSIS — K769 Liver disease, unspecified: Secondary | ICD-10-CM

## 2013-03-29 DIAGNOSIS — Z5112 Encounter for antineoplastic immunotherapy: Secondary | ICD-10-CM

## 2013-03-29 DIAGNOSIS — G5690 Unspecified mononeuropathy of unspecified upper limb: Secondary | ICD-10-CM

## 2013-03-29 DIAGNOSIS — C189 Malignant neoplasm of colon, unspecified: Secondary | ICD-10-CM

## 2013-03-29 DIAGNOSIS — Z5111 Encounter for antineoplastic chemotherapy: Secondary | ICD-10-CM

## 2013-03-29 DIAGNOSIS — K6389 Other specified diseases of intestine: Secondary | ICD-10-CM

## 2013-03-29 LAB — COMPREHENSIVE METABOLIC PANEL (CC13)
ALT: 7 U/L (ref 0–55)
AST: 15 U/L (ref 5–34)
Anion Gap: 9 mEq/L (ref 3–11)
CO2: 24 mEq/L (ref 22–29)
Creatinine: 0.9 mg/dL (ref 0.6–1.1)
Total Bilirubin: 0.87 mg/dL (ref 0.20–1.20)

## 2013-03-29 LAB — CBC WITH DIFFERENTIAL/PLATELET
BASO%: 0.8 % (ref 0.0–2.0)
Basophils Absolute: 0 10*3/uL (ref 0.0–0.1)
EOS%: 1.3 % (ref 0.0–7.0)
HCT: 32.6 % — ABNORMAL LOW (ref 34.8–46.6)
LYMPH%: 43.8 % (ref 14.0–49.7)
MCH: 26.4 pg (ref 25.1–34.0)
MCHC: 32.4 g/dL (ref 31.5–36.0)
MCV: 81.5 fL (ref 79.5–101.0)
MONO#: 0.5 10*3/uL (ref 0.1–0.9)
MONO%: 15.1 % — ABNORMAL HIGH (ref 0.0–14.0)
NEUT#: 1.3 10*3/uL — ABNORMAL LOW (ref 1.5–6.5)
NEUT%: 39 % (ref 38.4–76.8)
Platelets: 179 10*3/uL (ref 145–400)
RBC: 4 10*6/uL (ref 3.70–5.45)
WBC: 3.4 10*3/uL — ABNORMAL LOW (ref 3.9–10.3)

## 2013-03-29 MED ORDER — SODIUM CHLORIDE 0.9 % IJ SOLN
10.0000 mL | INTRAMUSCULAR | Status: DC | PRN
  Filled 2013-03-29: qty 10

## 2013-03-29 MED ORDER — DEXAMETHASONE SODIUM PHOSPHATE 10 MG/ML IJ SOLN
10.0000 mg | Freq: Once | INTRAMUSCULAR | Status: AC
  Administered 2013-03-29: 10 mg via INTRAVENOUS

## 2013-03-29 MED ORDER — FLUOROURACIL CHEMO INJECTION 2.5 GM/50ML
400.0000 mg/m2 | Freq: Once | INTRAVENOUS | Status: AC
  Administered 2013-03-29: 700 mg via INTRAVENOUS
  Filled 2013-03-29: qty 14

## 2013-03-29 MED ORDER — ONDANSETRON 8 MG/50ML IVPB (CHCC)
8.0000 mg | Freq: Once | INTRAVENOUS | Status: AC
  Administered 2013-03-29: 8 mg via INTRAVENOUS

## 2013-03-29 MED ORDER — OXALIPLATIN CHEMO INJECTION 100 MG/20ML
64.0000 mg/m2 | Freq: Once | INTRAVENOUS | Status: AC
  Administered 2013-03-29: 110 mg via INTRAVENOUS
  Filled 2013-03-29: qty 22

## 2013-03-29 MED ORDER — SODIUM CHLORIDE 0.9 % IV SOLN
5.0000 mg/kg | Freq: Once | INTRAVENOUS | Status: AC
  Administered 2013-03-29: 350 mg via INTRAVENOUS
  Filled 2013-03-29: qty 14

## 2013-03-29 MED ORDER — SODIUM CHLORIDE 0.9 % IV SOLN
2400.0000 mg/m2 | INTRAVENOUS | Status: DC
  Administered 2013-03-29: 4200 mg via INTRAVENOUS
  Filled 2013-03-29: qty 84

## 2013-03-29 MED ORDER — DEXAMETHASONE SODIUM PHOSPHATE 10 MG/ML IJ SOLN
INTRAMUSCULAR | Status: AC
  Filled 2013-03-29: qty 1

## 2013-03-29 MED ORDER — DEXTROSE 5 % IV SOLN
Freq: Once | INTRAVENOUS | Status: AC
  Administered 2013-03-29: 11:00:00 via INTRAVENOUS

## 2013-03-29 MED ORDER — SODIUM CHLORIDE 0.9 % IV SOLN
Freq: Once | INTRAVENOUS | Status: AC
  Administered 2013-03-29: 10:00:00 via INTRAVENOUS

## 2013-03-29 MED ORDER — LEUCOVORIN CALCIUM INJECTION 350 MG
400.0000 mg/m2 | Freq: Once | INTRAVENOUS | Status: AC
  Administered 2013-03-29: 700 mg via INTRAVENOUS
  Filled 2013-03-29: qty 35

## 2013-03-29 MED ORDER — ONDANSETRON 8 MG/NS 50 ML IVPB
INTRAVENOUS | Status: AC
  Filled 2013-03-29: qty 8

## 2013-03-29 NOTE — Progress Notes (Signed)
Hematology and Oncology Follow Up Visit  Margaret Elliott [id number] 09-Oct-1950 62 y.o. 02/01/2013 12:09 PM   Principle Diagnosis: This is a 62 year old woman diagnosed with colon cancer in June 2014. She had presented with an abdominal pain and found to have a cecal mass resulting in obstruction and advanced appendicitis.  She has at least stage III disease. Likely has stage IV disease with a liver mass.    Prior Therapy: She is status post laparoscopic laparotomy, evacuation of a pelvic abscess and right hemicolectomy with ileocolonic anastomosis done on December 09, 2012. The pathology showed an invasive, well- differentiated colorectal adenocarcinoma. Tumor invades through the muscularis propria, with 2/18 lymph nodes involved, with the pathological staging T3 N1b disease. Her tumor was found to be microsatellite stable. KRAS mutation is pending. She is also status post Port-A-Cath insertion that was done on 01/05/2013.   Current therapy: FOLFOX chemotherapy started 01/18/2013. Avastin to be added with cycle 2 on 02/01/2013. She is here for an evaluation before cycle 6.   Interim History:  Margaret Elliott presents today for a followup visit. She is a very pleasant 62-year with colon cancer as above. Tolerated the first 5 cycles of chemotherapy well. She is not reporting any abdominal pain. She does not report any diarrhea. Does not report any hematochezia. Does not report any melena. Does not report any nausea. Does not report any vomiting. Does not report any pelvic pain. Does not report any bony pain. She is reporting grade one motor neuropathy. She reported one episode of hematuria that have now subsided. She does report blood tinged sputum when she blows her nose but no frank bleeding anywhere else at this time. Her performance status and activity level remains excellent. She still works full-time and only Margaret Elliott for chemotherapy. Her appetite have been also excellent and she has gained  weight.   Medications: I have reviewed the patient's current medications.  Current Outpatient Prescriptions  Medication Sig Dispense Refill  . acetaminophen (TYLENOL) 500 MG tablet Take 500 mg by mouth every 6 (six) hours as needed for pain.      . diphenhydrAMINE (BENADRYL) 25 mg capsule Take 25-50 mg by mouth daily as needed for itching. For allergic reaction      . lidocaine-prilocaine (EMLA) cream Apply 1 application topically as needed. Apply approx 1/2 tsp to skin over port, prior to chemotherapy treatments  1 kit  3  . Multiple Vitamin (MULTIVITAMIN) tablet Take 1 tablet by mouth daily.      . ondansetron (ZOFRAN) 8 MG tablet Take 1 tablet (8 mg total) by mouth every 8 (eight) hours as needed for nausea.  30 tablet  1   No current facility-administered medications for this visit.     Allergies: No Known Allergies  Past Medical History, Surgical history, Social history, and Family History were reviewed and updated.  Review of Systems:  Remaining ROS negative. Physical Exam:  Blood pressure 119/75, pulse 98, temperature 97.7 F (36.5 C), temperature source Oral, resp. rate 18, height 5\' 4"  (1.626 m), weight 152 lb (68.947 kg). ECOG: 0 General appearance: alert Head: Normocephalic, without obvious abnormality, atraumatic Neck: no adenopathy, no carotid bruit, no JVD, supple, symmetrical, trachea midline and thyroid not enlarged, symmetric, no tenderness/mass/nodules Lymph nodes: Cervical, supraclavicular, and axillary nodes normal. Heart:regular rate and rhythm, S1, S2 normal, no murmur, click, rub or gallop Lung:chest clear, no wheezing, rales, normal symmetric air entry Abdomen: soft, non-tender, without masses or organomegaly EXT:no erythema, induration, or nodules Neurological: No  deficits noted. Gait and mood are stable.   Lab Results: Lab Results  Component Value Date   WBC 4.1 02/01/2013   HGB 9.9* 02/01/2013   HCT 30.5* 02/01/2013   MCV 80.1 02/01/2013   PLT 282  02/01/2013     Chemistry      Component Value Date/Time   NA 142 02/01/2013 1021   NA 141 01/04/2013 0837   K 4.4 02/01/2013 1021   K 4.0 01/04/2013 0837   CL 106 01/04/2013 0837   CO2 25 02/01/2013 1021   CO2 25 01/04/2013 0837   BUN 15.9 02/01/2013 1021   BUN 13 01/04/2013 0837   CREATININE 1.2* 02/01/2013 1021   CREATININE 0.94 01/04/2013 0837   CREATININE 1.06 09/30/2012 1553      Component Value Date/Time   CALCIUM 9.7 02/01/2013 1021   CALCIUM 9.6 01/04/2013 0837   ALKPHOS 87 02/01/2013 1021   ALKPHOS 86 12/09/2012 1045   AST 15 02/01/2013 1021   AST 16 12/09/2012 1045   ALT 8 02/01/2013 1021   ALT 12 12/09/2012 1045   BILITOT 0.57 02/01/2013 1021   BILITOT 0.3 12/09/2012 1045       Impression and Plan:  62 year old woman with the following issues:  1. Advanced Colon cancer. She presented with a large right cecal mass causing obstruction and abscess and appendicitis. PET/CT results indicate likely stage IV disease. She is here for cycle 6 and will proceed with any dose reduction or delay.   2. Intravenous access. PAC in place.  3. Nausea prophylaxis. She has Zofran at home.  4. 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 I have decreased her oxaliplatin dose by 25% which has helped at this point.   5. Abdominal protrusions/abscess: She is to follow with her dentist in the near future.   4. Follow up: in two weeks for cycle 7.    [facility] MD 03/29/2013

## 2013-03-29 NOTE — Telephone Encounter (Signed)
Error

## 2013-03-29 NOTE — Progress Notes (Signed)
OK to treat with ANC 1.3 per Dr. Clelia Croft.

## 2013-03-29 NOTE — Telephone Encounter (Signed)
Per staff message and POF I have scheduled appts.  JMW  

## 2013-03-29 NOTE — Patient Instructions (Addendum)
RETURN FOR PUMP DISCONNECT 10/10 AT     [facility] Discharge Instructions for Patients Receiving Chemotherapy  Today you received the following chemotherapy agents: avastin, leucovorin, oxaluiplatin, 83fu  To help prevent nausea and vomiting after your treatment, we encourage you to take your nausea medication.  Take it as often as prescribed.     If you develop nausea and vomiting that is not controlled by your nausea medication, call the clinic. If it is after clinic hours your family physician or the after hours number for the clinic or go to the Emergency Department.   BELOW ARE SYMPTOMS THAT SHOULD BE REPORTED IMMEDIATELY:  *FEVER GREATER THAN 100.5 F  *CHILLS WITH OR WITHOUT FEVER  NAUSEA AND VOMITING THAT IS NOT CONTROLLED WITH YOUR NAUSEA MEDICATION  *UNUSUAL SHORTNESS OF BREATH  *UNUSUAL BRUISING OR BLEEDING  TENDERNESS IN MOUTH AND THROAT WITH OR WITHOUT PRESENCE OF ULCERS  *URINARY PROBLEMS  *BOWEL PROBLEMS  UNUSUAL RASH Items with * indicate a potential emergency and should be followed up as soon as possible.  Feel free to call the clinic you have any questions or concerns. The clinic phone number is [phone].   I have been informed and understand all the instructions given to me. I know to contact the clinic, my physician, or go to the Emergency Department if any problems should occur. I do not have any questions at this time, but understand that I may call the clinic during office hours   should I have any questions or need assistance in obtaining follow up care.    __________________________________________  _____________  __________ Signature of Patient or Authorized Representative            Date                   Time    __________________________________________ Nurse's Signature

## 2013-03-29 NOTE — Telephone Encounter (Signed)
appts complete. Pt returned after inf and was given schedule for October thru November.

## 2013-03-31 ENCOUNTER — Ambulatory Visit (HOSPITAL_BASED_OUTPATIENT_CLINIC_OR_DEPARTMENT_OTHER): Payer: BC Managed Care – PPO

## 2013-03-31 VITALS — BP 138/97 | HR 73 | Temp 98.2°F

## 2013-03-31 DIAGNOSIS — K769 Liver disease, unspecified: Secondary | ICD-10-CM

## 2013-03-31 DIAGNOSIS — C180 Malignant neoplasm of cecum: Secondary | ICD-10-CM

## 2013-03-31 DIAGNOSIS — Z452 Encounter for adjustment and management of vascular access device: Secondary | ICD-10-CM

## 2013-03-31 DIAGNOSIS — K6389 Other specified diseases of intestine: Secondary | ICD-10-CM

## 2013-03-31 DIAGNOSIS — C189 Malignant neoplasm of colon, unspecified: Secondary | ICD-10-CM

## 2013-03-31 MED ORDER — SODIUM CHLORIDE 0.9 % IJ SOLN
10.0000 mL | INTRAMUSCULAR | Status: DC | PRN
  Administered 2013-03-31: 10 mL
  Filled 2013-03-31: qty 10

## 2013-03-31 MED ORDER — HEPARIN SOD (PORK) LOCK FLUSH 100 UNIT/ML IV SOLN
500.0000 [IU] | Freq: Once | INTRAVENOUS | Status: AC | PRN
  Administered 2013-03-31: 500 [IU]
  Filled 2013-03-31: qty 5

## 2013-04-04 ENCOUNTER — Telehealth: Payer: Self-pay | Admitting: *Deleted

## 2013-04-04 NOTE — Telephone Encounter (Signed)
Spoke with Shanda Bumps at dr Cain Sieve office, per dr Clelia Croft, okay for routine teeth cleaning.

## 2013-04-12 ENCOUNTER — Telehealth: Payer: Self-pay | Admitting: *Deleted

## 2013-04-12 ENCOUNTER — Ambulatory Visit (HOSPITAL_BASED_OUTPATIENT_CLINIC_OR_DEPARTMENT_OTHER): Payer: BC Managed Care – PPO

## 2013-04-12 ENCOUNTER — Telehealth: Payer: Self-pay | Admitting: Oncology

## 2013-04-12 ENCOUNTER — Ambulatory Visit (HOSPITAL_BASED_OUTPATIENT_CLINIC_OR_DEPARTMENT_OTHER): Payer: BC Managed Care – PPO | Admitting: Oncology

## 2013-04-12 ENCOUNTER — Other Ambulatory Visit (HOSPITAL_BASED_OUTPATIENT_CLINIC_OR_DEPARTMENT_OTHER): Payer: BC Managed Care – PPO | Admitting: Lab

## 2013-04-12 VITALS — BP 169/92 | HR 84 | Temp 97.8°F | Resp 20 | Ht 64.0 in | Wt 156.5 lb

## 2013-04-12 VITALS — BP 154/83

## 2013-04-12 DIAGNOSIS — K6389 Other specified diseases of intestine: Secondary | ICD-10-CM

## 2013-04-12 DIAGNOSIS — G622 Polyneuropathy due to other toxic agents: Secondary | ICD-10-CM

## 2013-04-12 DIAGNOSIS — C189 Malignant neoplasm of colon, unspecified: Secondary | ICD-10-CM

## 2013-04-12 DIAGNOSIS — IMO0002 Reserved for concepts with insufficient information to code with codable children: Secondary | ICD-10-CM

## 2013-04-12 DIAGNOSIS — Z5111 Encounter for antineoplastic chemotherapy: Secondary | ICD-10-CM

## 2013-04-12 DIAGNOSIS — K769 Liver disease, unspecified: Secondary | ICD-10-CM

## 2013-04-12 DIAGNOSIS — Z5112 Encounter for antineoplastic immunotherapy: Secondary | ICD-10-CM

## 2013-04-12 DIAGNOSIS — I10 Essential (primary) hypertension: Secondary | ICD-10-CM

## 2013-04-12 DIAGNOSIS — C180 Malignant neoplasm of cecum: Secondary | ICD-10-CM

## 2013-04-12 LAB — CBC WITH DIFFERENTIAL/PLATELET
Basophils Absolute: 0 10*3/uL (ref 0.0–0.1)
Eosinophils Absolute: 0.1 10*3/uL (ref 0.0–0.5)
HGB: 10.2 g/dL — ABNORMAL LOW (ref 11.6–15.9)
LYMPH%: 45.7 % (ref 14.0–49.7)
MCHC: 32.2 g/dL (ref 31.5–36.0)
MCV: 82.8 fL (ref 79.5–101.0)
MONO%: 9 % (ref 0.0–14.0)
NEUT#: 1.3 10*3/uL — ABNORMAL LOW (ref 1.5–6.5)
Platelets: 155 10*3/uL (ref 145–400)
RBC: 3.83 10*6/uL (ref 3.70–5.45)
nRBC: 1 % — ABNORMAL HIGH (ref 0–0)

## 2013-04-12 LAB — COMPREHENSIVE METABOLIC PANEL (CC13)
Alkaline Phosphatase: 73 U/L (ref 40–150)
CO2: 24 mEq/L (ref 22–29)
Creatinine: 0.9 mg/dL (ref 0.6–1.1)
Glucose: 99 mg/dl (ref 70–140)
Sodium: 141 mEq/L (ref 136–145)
Total Bilirubin: 0.93 mg/dL (ref 0.20–1.20)
Total Protein: 7.5 g/dL (ref 6.4–8.3)

## 2013-04-12 MED ORDER — DEXAMETHASONE SODIUM PHOSPHATE 10 MG/ML IJ SOLN
INTRAMUSCULAR | Status: AC
  Filled 2013-04-12: qty 1

## 2013-04-12 MED ORDER — SODIUM CHLORIDE 0.9 % IV SOLN
Freq: Once | INTRAVENOUS | Status: AC
  Administered 2013-04-12: 11:00:00 via INTRAVENOUS

## 2013-04-12 MED ORDER — OXALIPLATIN CHEMO INJECTION 100 MG/20ML
42.5000 mg/m2 | Freq: Once | INTRAVENOUS | Status: AC
  Administered 2013-04-12: 75 mg via INTRAVENOUS
  Filled 2013-04-12: qty 15

## 2013-04-12 MED ORDER — DEXAMETHASONE SODIUM PHOSPHATE 10 MG/ML IJ SOLN
10.0000 mg | Freq: Once | INTRAMUSCULAR | Status: AC
  Administered 2013-04-12: 10 mg via INTRAVENOUS

## 2013-04-12 MED ORDER — SODIUM CHLORIDE 0.9 % IV SOLN
2400.0000 mg/m2 | INTRAVENOUS | Status: DC
  Administered 2013-04-12: 4200 mg via INTRAVENOUS
  Filled 2013-04-12: qty 84

## 2013-04-12 MED ORDER — FLUOROURACIL CHEMO INJECTION 2.5 GM/50ML
400.0000 mg/m2 | Freq: Once | INTRAVENOUS | Status: AC
  Administered 2013-04-12: 700 mg via INTRAVENOUS
  Filled 2013-04-12: qty 14

## 2013-04-12 MED ORDER — ONDANSETRON 8 MG/NS 50 ML IVPB
INTRAVENOUS | Status: AC
  Filled 2013-04-12: qty 8

## 2013-04-12 MED ORDER — SODIUM CHLORIDE 0.9 % IV SOLN
5.0000 mg/kg | Freq: Once | INTRAVENOUS | Status: AC
  Administered 2013-04-12: 350 mg via INTRAVENOUS
  Filled 2013-04-12: qty 14

## 2013-04-12 MED ORDER — DEXTROSE 5 % IV SOLN
Freq: Once | INTRAVENOUS | Status: AC
  Administered 2013-04-12: 10:00:00 via INTRAVENOUS

## 2013-04-12 MED ORDER — LEUCOVORIN CALCIUM INJECTION 350 MG
400.0000 mg/m2 | Freq: Once | INTRAVENOUS | Status: AC
  Administered 2013-04-12: 700 mg via INTRAVENOUS
  Filled 2013-04-12: qty 35

## 2013-04-12 MED ORDER — ONDANSETRON 8 MG/50ML IVPB (CHCC)
8.0000 mg | Freq: Once | INTRAVENOUS | Status: AC
  Administered 2013-04-12: 8 mg via INTRAVENOUS

## 2013-04-12 MED ORDER — AMLODIPINE BESYLATE 5 MG PO TABS: 5.0000 mg | ORAL_TABLET | Freq: Every day | ORAL | Status: DC

## 2013-04-12 NOTE — Progress Notes (Signed)
Per Dr. Clelia Croft, its OK to treat today despite counts.

## 2013-04-12 NOTE — Progress Notes (Addendum)
Hematology and Oncology Follow Up Visit  Margaret Elliott [id number] 04-24-1951 62 y.o. 02/01/2013 12:09 PM   Principle Diagnosis: This is a 62 year old woman diagnosed with colon cancer in June 2014. She had presented with an abdominal pain and found to have a cecal mass resulting in obstruction and advanced appendicitis.  She has at least stage III disease. Likely has stage IV disease with a liver mass.    Prior Therapy: She is status post laparoscopic laparotomy, evacuation of a pelvic abscess and right hemicolectomy with ileocolonic anastomosis done on December 09, 2012. The pathology showed an invasive, well- differentiated colorectal adenocarcinoma. Tumor invades through the muscularis propria, with 2/18 lymph nodes involved, with the pathological staging T3 N1b disease. Her tumor was found to be microsatellite stable. KRAS mutation is pending. She is also status post Port-A-Cath insertion that was done on 01/05/2013.   Current therapy: FOLFOX chemotherapy started 01/18/2013. Avastin to be added with cycle 2 on 02/01/2013. She is here for an evaluation before cycle 7.   Interim History:  [name] presents today for a followup visit. She is a very pleasant 62-year with colon cancer as above. Tolerated the first 6 cycles of chemotherapy well. She is not reporting any abdominal pain. She does not report any diarrhea. Does not report any hematochezia. Does not report any melena. Does not report any nausea. Does not report any vomiting. Does not report any pelvic pain. Does not report any bony pain. 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 She reported one episode of hematuria that have now subsided. She does report blood tinged sputum when she blows her nose but no frank bleeding anywhere else at this time. Her performance status and activity level remains excellent. She still works full-time and only Mrs. Wednesday for chemotherapy. Her appetite have been also excellent and she has gained  weight. No recent hospitalizations or illnesses.   Medications: I have reviewed the patient's current medications.  Current Outpatient Prescriptions  Medication Sig Dispense Refill  . acetaminophen (TYLENOL) 500 MG tablet Take 500 mg by mouth every 6 (six) hours as needed for pain.      . diphenhydrAMINE (BENADRYL) 25 mg capsule Take 25-50 mg by mouth daily as needed for itching. For allergic reaction      . lidocaine-prilocaine (EMLA) cream Apply 1 application topically as needed. Apply approx 1/2 tsp to skin over port, prior to chemotherapy treatments  1 kit  3  . Multiple Vitamin (MULTIVITAMIN) tablet Take 1 tablet by mouth daily.      . ondansetron (ZOFRAN) 8 MG tablet Take 1 tablet (8 mg total) by mouth every 8 (eight) hours as needed for nausea.  30 tablet  1   No current facility-administered medications for this visit.     Allergies: No Known Allergies  Past Medical History, Surgical history, Social history, and Family History were reviewed and updated.  Review of Systems:  Remaining ROS negative. Physical Exam:  Blood pressure 119/75, pulse 98, temperature 97.7 F (36.5 C), temperature source Oral, resp. rate 18, height 5\' 4"  (1.626 m), weight 152 lb (68.947 kg). ECOG: 0 General appearance: alert Head: Normocephalic, without obvious abnormality, atraumatic Neck: no adenopathy, no carotid bruit, no JVD, supple, symmetrical, trachea midline and thyroid not enlarged, symmetric, no tenderness/mass/nodules Lymph nodes: Cervical, supraclavicular, and axillary nodes normal. Heart:regular rate and rhythm, S1, S2 normal, no murmur, click, rub or gallop Lung:chest clear, no wheezing, rales, normal symmetric air entry Abdomen: soft, non-tender, without masses or organomegaly  EXT:no erythema, induration, or nodules Neurological: No deficits noted. Gait and mood are stable.   Lab Results: Lab Results  Component Value Date   WBC 4.1 02/01/2013   HGB 9.9* 02/01/2013   HCT 30.5*  02/01/2013   MCV 80.1 02/01/2013   PLT 282 02/01/2013     Chemistry      Component Value Date/Time   NA 142 02/01/2013 1021   NA 141 01/04/2013 0837   K 4.4 02/01/2013 1021   K 4.0 01/04/2013 0837   CL 106 01/04/2013 0837   CO2 25 02/01/2013 1021   CO2 25 01/04/2013 0837   BUN 15.9 02/01/2013 1021   BUN 13 01/04/2013 0837   CREATININE 1.2* 02/01/2013 1021   CREATININE 0.94 01/04/2013 0837   CREATININE 1.06 09/30/2012 1553      Component Value Date/Time   CALCIUM 9.7 02/01/2013 1021   CALCIUM 9.6 01/04/2013 0837   ALKPHOS 87 02/01/2013 1021   ALKPHOS 86 12/09/2012 1045   AST 15 02/01/2013 1021   AST 16 12/09/2012 1045   ALT 8 02/01/2013 1021   ALT 12 12/09/2012 1045   BILITOT 0.57 02/01/2013 1021   BILITOT 0.3 12/09/2012 1045       Impression and Plan:  62 year old woman with the following issues:  1. Advanced Colon cancer. She presented with a large right cecal mass causing obstruction and abscess and appendicitis. PET/CT results indicate likely stage IV disease. She is here for cycle 7 and will proceed without any delay.   2. Intravenous access. PAC in place.  3. Nausea prophylaxis. She has Zofran at home.  4. 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 I will decrease her oxaliplatin dose to 50%.   5. Abdominal protrusions/abscess: She is to follow with her dentist in the near future.   6. Follow up: in two weeks for cycle 8.   7. Hypertension: This is most likely related to Avastin. I gave her the option of discontinuing this drug versus starting an antihypertensive medication. Risks and benefits of both approaches were discussed and she is agreeable to try Norvasc. I've given her a starting dose of 5 mg daily and we will titrate up in future visits.   [facility] MD 04/12/2013

## 2013-04-12 NOTE — Patient Instructions (Signed)
[facility] Discharge Instructions for Patients Receiving Chemotherapy  Today you received the following chemotherapy agents Oxaliplatin, Leucovorin, Adrucil and Avastin.  To help prevent nausea and vomiting after your treatment, we encourage you to take your nausea medication as prescribed.   If you develop nausea and vomiting that is not controlled by your nausea medication, call the clinic.   BELOW ARE SYMPTOMS THAT SHOULD BE REPORTED IMMEDIATELY:  *FEVER GREATER THAN 100.5 F  *CHILLS WITH OR WITHOUT FEVER  NAUSEA AND VOMITING THAT IS NOT CONTROLLED WITH YOUR NAUSEA MEDICATION  *UNUSUAL SHORTNESS OF BREATH  *UNUSUAL BRUISING OR BLEEDING  TENDERNESS IN MOUTH AND THROAT WITH OR WITHOUT PRESENCE OF ULCERS  *URINARY PROBLEMS  *BOWEL PROBLEMS  UNUSUAL RASH Items with * indicate a potential emergency and should be followed up as soon as possible.  Feel free to call the clinic you have any questions or concerns. The clinic phone number is [phone].

## 2013-04-12 NOTE — Telephone Encounter (Signed)
Gave pt appt for lab,md ,chemo and oral contrast for CT for October  and November 2014

## 2013-04-12 NOTE — Telephone Encounter (Signed)
Pt will come and get calendar later after chemo

## 2013-04-12 NOTE — Telephone Encounter (Signed)
Per staff message and POF I have scheduled appts.  JMW  

## 2013-04-14 ENCOUNTER — Ambulatory Visit (HOSPITAL_BASED_OUTPATIENT_CLINIC_OR_DEPARTMENT_OTHER): Payer: BC Managed Care – PPO

## 2013-04-14 VITALS — BP 158/84 | HR 72 | Temp 98.0°F

## 2013-04-14 DIAGNOSIS — K769 Liver disease, unspecified: Secondary | ICD-10-CM

## 2013-04-14 DIAGNOSIS — C189 Malignant neoplasm of colon, unspecified: Secondary | ICD-10-CM

## 2013-04-14 DIAGNOSIS — C180 Malignant neoplasm of cecum: Secondary | ICD-10-CM

## 2013-04-14 DIAGNOSIS — K6389 Other specified diseases of intestine: Secondary | ICD-10-CM

## 2013-04-14 MED ORDER — SODIUM CHLORIDE 0.9 % IJ SOLN
10.0000 mL | INTRAMUSCULAR | Status: DC | PRN
  Administered 2013-04-14: 10 mL
  Filled 2013-04-14: qty 10

## 2013-04-14 MED ORDER — HEPARIN SOD (PORK) LOCK FLUSH 100 UNIT/ML IV SOLN
500.0000 [IU] | Freq: Once | INTRAVENOUS | Status: AC | PRN
  Administered 2013-04-14: 500 [IU]
  Filled 2013-04-14: qty 5

## 2013-04-26 ENCOUNTER — Ambulatory Visit (HOSPITAL_BASED_OUTPATIENT_CLINIC_OR_DEPARTMENT_OTHER): Payer: BC Managed Care – PPO | Admitting: Oncology

## 2013-04-26 ENCOUNTER — Other Ambulatory Visit (HOSPITAL_BASED_OUTPATIENT_CLINIC_OR_DEPARTMENT_OTHER): Payer: BC Managed Care – PPO | Admitting: Lab

## 2013-04-26 ENCOUNTER — Ambulatory Visit (HOSPITAL_BASED_OUTPATIENT_CLINIC_OR_DEPARTMENT_OTHER): Payer: BC Managed Care – PPO

## 2013-04-26 VITALS — BP 148/87 | HR 72

## 2013-04-26 VITALS — BP 158/102 | HR 87 | Temp 97.9°F | Resp 18 | Ht 64.0 in | Wt 156.5 lb

## 2013-04-26 DIAGNOSIS — Z5112 Encounter for antineoplastic immunotherapy: Secondary | ICD-10-CM

## 2013-04-26 DIAGNOSIS — I10 Essential (primary) hypertension: Secondary | ICD-10-CM

## 2013-04-26 DIAGNOSIS — K769 Liver disease, unspecified: Secondary | ICD-10-CM

## 2013-04-26 DIAGNOSIS — C180 Malignant neoplasm of cecum: Secondary | ICD-10-CM

## 2013-04-26 DIAGNOSIS — C189 Malignant neoplasm of colon, unspecified: Secondary | ICD-10-CM

## 2013-04-26 DIAGNOSIS — K6389 Other specified diseases of intestine: Secondary | ICD-10-CM

## 2013-04-26 DIAGNOSIS — G5690 Unspecified mononeuropathy of unspecified upper limb: Secondary | ICD-10-CM

## 2013-04-26 DIAGNOSIS — Z5111 Encounter for antineoplastic chemotherapy: Secondary | ICD-10-CM

## 2013-04-26 LAB — COMPREHENSIVE METABOLIC PANEL (CC13)
ALT: 9 U/L (ref 0–55)
Alkaline Phosphatase: 77 U/L (ref 40–150)
Anion Gap: 10 mEq/L (ref 3–11)
BUN: 16.4 mg/dL (ref 7.0–26.0)
CO2: 23 mEq/L (ref 22–29)
Calcium: 10 mg/dL (ref 8.4–10.4)
Chloride: 108 mEq/L (ref 98–109)
Creatinine: 0.9 mg/dL (ref 0.6–1.1)
Glucose: 86 mg/dl (ref 70–140)
Sodium: 141 mEq/L (ref 136–145)
Total Bilirubin: 0.83 mg/dL (ref 0.20–1.20)
Total Protein: 7.8 g/dL (ref 6.4–8.3)

## 2013-04-26 LAB — CBC WITH DIFFERENTIAL/PLATELET
BASO%: 0.8 % (ref 0.0–2.0)
Basophils Absolute: 0 10*3/uL (ref 0.0–0.1)
Eosinophils Absolute: 0.1 10*3/uL (ref 0.0–0.5)
LYMPH%: 39.6 % (ref 14.0–49.7)
MCHC: 32.4 g/dL (ref 31.5–36.0)
MCV: 84.6 fL (ref 79.5–101.0)
MONO#: 0.6 10*3/uL (ref 0.1–0.9)
MONO%: 14.1 % — ABNORMAL HIGH (ref 0.0–14.0)
NEUT#: 1.9 10*3/uL (ref 1.5–6.5)
Platelets: 203 10*3/uL (ref 145–400)
RBC: 3.95 10*6/uL (ref 3.70–5.45)
WBC: 4.3 10*3/uL (ref 3.9–10.3)

## 2013-04-26 MED ORDER — OXALIPLATIN CHEMO INJECTION 100 MG/20ML
42.5000 mg/m2 | Freq: Once | INTRAVENOUS | Status: AC
  Administered 2013-04-26: 75 mg via INTRAVENOUS
  Filled 2013-04-26: qty 15

## 2013-04-26 MED ORDER — DEXAMETHASONE SODIUM PHOSPHATE 10 MG/ML IJ SOLN
10.0000 mg | Freq: Once | INTRAMUSCULAR | Status: AC
  Administered 2013-04-26: 10 mg via INTRAVENOUS

## 2013-04-26 MED ORDER — ONDANSETRON 8 MG/NS 50 ML IVPB
INTRAVENOUS | Status: AC
  Filled 2013-04-26: qty 8

## 2013-04-26 MED ORDER — SODIUM CHLORIDE 0.9 % IV SOLN
Freq: Once | INTRAVENOUS | Status: AC
  Administered 2013-04-26: 11:00:00 via INTRAVENOUS

## 2013-04-26 MED ORDER — SODIUM CHLORIDE 0.9 % IV SOLN
2400.0000 mg/m2 | INTRAVENOUS | Status: DC
  Administered 2013-04-26: 4200 mg via INTRAVENOUS
  Filled 2013-04-26: qty 84

## 2013-04-26 MED ORDER — ONDANSETRON 8 MG/50ML IVPB (CHCC)
8.0000 mg | Freq: Once | INTRAVENOUS | Status: AC
  Administered 2013-04-26: 8 mg via INTRAVENOUS

## 2013-04-26 MED ORDER — LEUCOVORIN CALCIUM INJECTION 350 MG
400.0000 mg/m2 | Freq: Once | INTRAVENOUS | Status: AC
  Administered 2013-04-26: 700 mg via INTRAVENOUS
  Filled 2013-04-26: qty 35

## 2013-04-26 MED ORDER — FLUOROURACIL CHEMO INJECTION 2.5 GM/50ML
400.0000 mg/m2 | Freq: Once | INTRAVENOUS | Status: AC
  Administered 2013-04-26: 700 mg via INTRAVENOUS
  Filled 2013-04-26: qty 14

## 2013-04-26 MED ORDER — DEXAMETHASONE SODIUM PHOSPHATE 10 MG/ML IJ SOLN
INTRAMUSCULAR | Status: AC
  Filled 2013-04-26: qty 1

## 2013-04-26 MED ORDER — SODIUM CHLORIDE 0.9 % IV SOLN
5.0000 mg/kg | Freq: Once | INTRAVENOUS | Status: AC
  Administered 2013-04-26: 350 mg via INTRAVENOUS
  Filled 2013-04-26: qty 14

## 2013-04-26 MED ORDER — DEXTROSE 5 % IV SOLN
Freq: Once | INTRAVENOUS | Status: AC
  Administered 2013-04-26: 12:00:00 via INTRAVENOUS

## 2013-04-26 NOTE — Patient Instructions (Signed)
[facility] Discharge Instructions for Patients Receiving Chemotherapy  Today you received the following chemotherapy agents Avastin, Oxaliplatin, leucovorin and Adrucil.  To help prevent nausea and vomiting after your treatment, we encourage you to take your nausea medication.   If you develop nausea and vomiting that is not controlled by your nausea medication, call the clinic.   BELOW ARE SYMPTOMS THAT SHOULD BE REPORTED IMMEDIATELY:  *FEVER GREATER THAN 100.5 F  *CHILLS WITH OR WITHOUT FEVER  NAUSEA AND VOMITING THAT IS NOT CONTROLLED WITH YOUR NAUSEA MEDICATION  *UNUSUAL SHORTNESS OF BREATH  *UNUSUAL BRUISING OR BLEEDING  TENDERNESS IN MOUTH AND THROAT WITH OR WITHOUT PRESENCE OF ULCERS  *URINARY PROBLEMS  *BOWEL PROBLEMS  UNUSUAL RASH Items with * indicate a potential emergency and should be followed up as soon as possible.  Feel free to call the clinic you have any questions or concerns. The clinic phone number is [phone].

## 2013-04-26 NOTE — Progress Notes (Signed)
Hematology and Oncology Follow Up Visit  Margaret Elliott [id number] 02-20-1951 62 y.o.    Principle Diagnosis: This is a 62 year old woman diagnosed with colon cancer in June 2014. She had presented with an abdominal pain and found to have a cecal mass resulting in obstruction and advanced appendicitis.  She has at least stage III disease. Likely has stage IV disease with a liver mass.    Prior Therapy: She is status post laparoscopic laparotomy, evacuation of a pelvic abscess and right hemicolectomy with ileocolonic anastomosis done on December 09, 2012. The pathology showed an invasive, well- differentiated colorectal adenocarcinoma. Tumor invades through the muscularis propria, with 2/18 lymph nodes involved, with the pathological staging T3 N1b disease. Her tumor was found to be microsatellite stable. KRAS mutation is pending. She is also status post Port-A-Cath insertion that was done on 01/05/2013.   Current therapy: FOLFOX chemotherapy started 01/18/2013. Avastin to be added with cycle 2 on 02/01/2013. She is here for an evaluation before cycle 8.   Interim History:  Ms. Platts presents today for a followup visit. She is a very pleasant 62-year with colon cancer as above. Tolerated the first 7 cycles of chemotherapy well. She is not reporting any abdominal pain. She does not report any diarrhea. Does not report any hematochezia. Does not report any melena. Does not report any nausea. Does not report any vomiting. Does not report any pelvic pain. Does not report any bony pain. 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 Her performance status and activity level remains excellent. She still works full-time and only Mrs. Wednesday for chemotherapy. Her appetite have been also excellent and she has gained weight. No recent hospitalizations or illnesses. She tolerated Norvasc without any complications. She reports her blood pressure is within normal range when she checks it at  home.   Medications: I have reviewed the patient's current medications.  Current Outpatient Prescriptions  Medication Sig Dispense Refill  . acetaminophen (TYLENOL) 500 MG tablet Take 500 mg by mouth every 6 (six) hours as needed for pain.      . diphenhydrAMINE (BENADRYL) 25 mg capsule Take 25-50 mg by mouth daily as needed for itching. For allergic reaction      . lidocaine-prilocaine (EMLA) cream Apply 1 application topically as needed. Apply approx 1/2 tsp to skin over port, prior to chemotherapy treatments  1 kit  3  . Multiple Vitamin (MULTIVITAMIN) tablet Take 1 tablet by mouth daily.      . ondansetron (ZOFRAN) 8 MG tablet Take 1 tablet (8 mg total) by mouth every 8 (eight) hours as needed for nausea.  30 tablet  1   No current facility-administered medications for this visit.     Allergies: No Known Allergies  Past Medical History, Surgical history, Social history, and Family History were reviewed and updated.  Review of Systems:  Remaining ROS negative. Physical Exam:  Blood pressure 119/75, pulse 98, temperature 97.7 F (36.5 C), temperature source Oral, resp. rate 18, height 5\' 4"  (1.626 m), weight 152 lb (68.947 kg). ECOG: 0 General appearance: alert Head: Normocephalic, without obvious abnormality, atraumatic Neck: no adenopathy, no carotid bruit, no JVD, supple, symmetrical, trachea midline and thyroid not enlarged, symmetric, no tenderness/mass/nodules Lymph nodes: Cervical, supraclavicular, and axillary nodes normal. Heart:regular rate and rhythm, S1, S2 normal, no murmur, click, rub or gallop Lung:chest clear, no wheezing, rales, normal symmetric air entry Abdomen: soft, non-tender, without masses or organomegaly EXT:no erythema, induration, or nodules Neurological: No deficits noted. Gait and  mood are stable.   CBC    Component Value Date/Time   WBC 4.3 04/26/2013 0938   WBC 4.3 01/04/2013 0837   WBC 8.6 11/10/2011 1329   RBC 3.95 04/26/2013 0938   RBC  3.50* 01/04/2013 0837   RBC 3.95* 11/10/2011 1329   HGB 10.9* 04/26/2013 0938   HGB 9.4* 01/04/2013 0837   HGB 11.3* 11/10/2011 1329   HCT 33.5* 04/26/2013 0938   HCT 28.5* 01/04/2013 0837   HCT 35.8* 11/10/2011 1329   PLT 203 04/26/2013 0938   PLT 382 01/04/2013 0837   MCV 84.6 04/26/2013 0938   MCV 81.4 01/04/2013 0837   MCV 90.7 11/10/2011 1329   MCH 27.5 04/26/2013 0938   MCH 26.9 01/04/2013 0837   MCH 28.6 11/10/2011 1329   MCHC 32.4 04/26/2013 0938   MCHC 33.0 01/04/2013 0837   MCHC 31.6* 11/10/2011 1329   RDW 23.8* 04/26/2013 0938   RDW 15.8* 01/04/2013 0837   LYMPHSABS 1.7 04/26/2013 0938   LYMPHSABS 1.2 12/09/2012 1045   MONOABS 0.6 04/26/2013 0938   MONOABS 0.9 12/09/2012 1045   EOSABS 0.1 04/26/2013 0938   EOSABS 0.0 12/09/2012 1045   BASOSABS 0.0 04/26/2013 0938   BASOSABS 0.0 12/09/2012 1045        Impression and Plan:  62 year old woman with the following issues:  1. Advanced Colon cancer. She presented with a large right cecal mass causing obstruction and abscess and appendicitis. PET/CT results indicate likely stage IV disease. She is here for cycle 8 and will proceed without any delay. He will obtain repeat imaging studies before the next cycle of chemotherapy  2. Intravenous access. PAC in place.  3. Nausea prophylaxis. She has Zofran at home.  4. 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 This is stable with 50% dose reduction of oxaliplatin.  5. Abdominal protrusions/abscess: She is to follow with her dentist in the near future.   6. Follow up: in two weeks for cycle 9.   7. Hypertension: This is most likely related to Avastin. She is doing better with Norvasc. I've asked her to keep a long with her blood pressure readings and we'll adjust her blood pressure medication accordingly.   [facility] MD 04/26/2013

## 2013-04-27 ENCOUNTER — Other Ambulatory Visit: Payer: Self-pay

## 2013-04-28 ENCOUNTER — Ambulatory Visit (HOSPITAL_BASED_OUTPATIENT_CLINIC_OR_DEPARTMENT_OTHER): Payer: BC Managed Care – PPO

## 2013-04-28 VITALS — BP 147/82 | HR 74 | Temp 97.3°F

## 2013-04-28 DIAGNOSIS — K769 Liver disease, unspecified: Secondary | ICD-10-CM

## 2013-04-28 DIAGNOSIS — Z452 Encounter for adjustment and management of vascular access device: Secondary | ICD-10-CM

## 2013-04-28 DIAGNOSIS — C189 Malignant neoplasm of colon, unspecified: Secondary | ICD-10-CM

## 2013-04-28 DIAGNOSIS — K6389 Other specified diseases of intestine: Secondary | ICD-10-CM

## 2013-04-28 DIAGNOSIS — C180 Malignant neoplasm of cecum: Secondary | ICD-10-CM

## 2013-04-28 MED ORDER — HEPARIN SOD (PORK) LOCK FLUSH 100 UNIT/ML IV SOLN
500.0000 [IU] | Freq: Once | INTRAVENOUS | Status: AC | PRN
  Administered 2013-04-28: 500 [IU]
  Filled 2013-04-28: qty 5

## 2013-04-28 MED ORDER — SODIUM CHLORIDE 0.9 % IJ SOLN
10.0000 mL | INTRAMUSCULAR | Status: DC | PRN
  Administered 2013-04-28: 10 mL
  Filled 2013-04-28: qty 10

## 2013-05-08 ENCOUNTER — Encounter (HOSPITAL_COMMUNITY): Payer: Self-pay

## 2013-05-08 ENCOUNTER — Ambulatory Visit (HOSPITAL_COMMUNITY)
Admission: RE | Admit: 2013-05-08 | Discharge: 2013-05-08 | Disposition: A | Discharge status: Home / Self Care | Payer: BC Managed Care – PPO | Source: Ambulatory Visit | Attending: Oncology | Admitting: Oncology

## 2013-05-08 DIAGNOSIS — K7689 Other specified diseases of liver: Secondary | ICD-10-CM | POA: Insufficient documentation

## 2013-05-08 DIAGNOSIS — M5137 Other intervertebral disc degeneration, lumbosacral region: Secondary | ICD-10-CM | POA: Insufficient documentation

## 2013-05-08 DIAGNOSIS — R911 Solitary pulmonary nodule: Secondary | ICD-10-CM | POA: Insufficient documentation

## 2013-05-08 DIAGNOSIS — N839 Noninflammatory disorder of ovary, fallopian tube and broad ligament, unspecified: Secondary | ICD-10-CM | POA: Insufficient documentation

## 2013-05-08 DIAGNOSIS — M51379 Other intervertebral disc degeneration, lumbosacral region without mention of lumbar back pain or lower extremity pain: Secondary | ICD-10-CM | POA: Insufficient documentation

## 2013-05-08 DIAGNOSIS — Z9089 Acquired absence of other organs: Secondary | ICD-10-CM | POA: Insufficient documentation

## 2013-05-08 DIAGNOSIS — Z79899 Other long term (current) drug therapy: Secondary | ICD-10-CM | POA: Insufficient documentation

## 2013-05-08 DIAGNOSIS — Z90710 Acquired absence of both cervix and uterus: Secondary | ICD-10-CM | POA: Insufficient documentation

## 2013-05-08 DIAGNOSIS — N281 Cyst of kidney, acquired: Secondary | ICD-10-CM | POA: Insufficient documentation

## 2013-05-08 DIAGNOSIS — C189 Malignant neoplasm of colon, unspecified: Secondary | ICD-10-CM | POA: Insufficient documentation

## 2013-05-08 MED ORDER — IOHEXOL 300 MG/ML  SOLN
100.0000 mL | Freq: Once | INTRAMUSCULAR | Status: AC | PRN
  Administered 2013-05-08: 100 mL via INTRAVENOUS

## 2013-05-10 ENCOUNTER — Ambulatory Visit (HOSPITAL_BASED_OUTPATIENT_CLINIC_OR_DEPARTMENT_OTHER): Payer: BC Managed Care – PPO | Admitting: Oncology

## 2013-05-10 ENCOUNTER — Ambulatory Visit (HOSPITAL_BASED_OUTPATIENT_CLINIC_OR_DEPARTMENT_OTHER): Payer: BC Managed Care – PPO

## 2013-05-10 ENCOUNTER — Telehealth: Payer: Self-pay | Admitting: *Deleted

## 2013-05-10 ENCOUNTER — Telehealth: Payer: Self-pay | Admitting: Oncology

## 2013-05-10 ENCOUNTER — Other Ambulatory Visit (HOSPITAL_BASED_OUTPATIENT_CLINIC_OR_DEPARTMENT_OTHER): Payer: BC Managed Care – PPO | Admitting: Lab

## 2013-05-10 VITALS — BP 147/96 | HR 83 | Temp 96.7°F | Resp 18 | Ht 64.0 in | Wt 155.7 lb

## 2013-05-10 DIAGNOSIS — G620 Drug-induced polyneuropathy: Secondary | ICD-10-CM

## 2013-05-10 DIAGNOSIS — K7689 Other specified diseases of liver: Secondary | ICD-10-CM

## 2013-05-10 DIAGNOSIS — C180 Malignant neoplasm of cecum: Secondary | ICD-10-CM

## 2013-05-10 DIAGNOSIS — Z5111 Encounter for antineoplastic chemotherapy: Secondary | ICD-10-CM

## 2013-05-10 DIAGNOSIS — K769 Liver disease, unspecified: Secondary | ICD-10-CM

## 2013-05-10 DIAGNOSIS — K6389 Other specified diseases of intestine: Secondary | ICD-10-CM

## 2013-05-10 DIAGNOSIS — C189 Malignant neoplasm of colon, unspecified: Secondary | ICD-10-CM

## 2013-05-10 DIAGNOSIS — I10 Essential (primary) hypertension: Secondary | ICD-10-CM

## 2013-05-10 LAB — COMPREHENSIVE METABOLIC PANEL (CC13)
ALT: 9 U/L (ref 0–55)
Anion Gap: 9 mEq/L (ref 3–11)
CO2: 26 mEq/L (ref 22–29)
Calcium: 10 mg/dL (ref 8.4–10.4)
Chloride: 106 mEq/L (ref 98–109)
Creatinine: 1 mg/dL (ref 0.6–1.1)
Glucose: 91 mg/dl (ref 70–140)
Potassium: 4.3 mEq/L (ref 3.5–5.1)

## 2013-05-10 LAB — UA PROTEIN, DIPSTICK - CHCC: Protein, ur: NEGATIVE mg/dL

## 2013-05-10 LAB — CBC WITH DIFFERENTIAL/PLATELET
BASO%: 0.6 % (ref 0.0–2.0)
Basophils Absolute: 0 10*3/uL (ref 0.0–0.1)
Eosinophils Absolute: 0 10*3/uL (ref 0.0–0.5)
HCT: 33.9 % — ABNORMAL LOW (ref 34.8–46.6)
HGB: 11 g/dL — ABNORMAL LOW (ref 11.6–15.9)
LYMPH%: 36 % (ref 14.0–49.7)
MONO#: 0.6 10*3/uL (ref 0.1–0.9)
NEUT#: 1.9 10*3/uL (ref 1.5–6.5)
NEUT%: 47.8 % (ref 38.4–76.8)
Platelets: 205 10*3/uL (ref 145–400)
RDW: 23.1 % — ABNORMAL HIGH (ref 11.2–14.5)
WBC: 4 10*3/uL (ref 3.9–10.3)
lymph#: 1.4 10*3/uL (ref 0.9–3.3)

## 2013-05-10 MED ORDER — DEXAMETHASONE SODIUM PHOSPHATE 10 MG/ML IJ SOLN
10.0000 mg | Freq: Once | INTRAMUSCULAR | Status: AC
  Administered 2013-05-10: 10 mg via INTRAVENOUS

## 2013-05-10 MED ORDER — SODIUM CHLORIDE 0.9 % IV SOLN
2400.0000 mg/m2 | INTRAVENOUS | Status: DC
  Administered 2013-05-10: 4200 mg via INTRAVENOUS
  Filled 2013-05-10: qty 84

## 2013-05-10 MED ORDER — FLUOROURACIL CHEMO INJECTION 2.5 GM/50ML
400.0000 mg/m2 | Freq: Once | INTRAVENOUS | Status: AC
  Administered 2013-05-10: 700 mg via INTRAVENOUS
  Filled 2013-05-10: qty 14

## 2013-05-10 MED ORDER — SODIUM CHLORIDE 0.9 % IV SOLN
Freq: Once | INTRAVENOUS | Status: AC
  Administered 2013-05-10: 11:00:00 via INTRAVENOUS

## 2013-05-10 MED ORDER — AMLODIPINE BESYLATE 5 MG PO TABS: 5.0000 mg | ORAL_TABLET | Freq: Every day | ORAL | Status: DC

## 2013-05-10 MED ORDER — LEUCOVORIN CALCIUM INJECTION 350 MG
400.0000 mg/m2 | Freq: Once | INTRAVENOUS | Status: AC
  Administered 2013-05-10: 700 mg via INTRAVENOUS
  Filled 2013-05-10: qty 35

## 2013-05-10 MED ORDER — ONDANSETRON 8 MG/NS 50 ML IVPB
INTRAVENOUS | Status: AC
  Filled 2013-05-10: qty 8

## 2013-05-10 MED ORDER — OXALIPLATIN CHEMO INJECTION 100 MG/20ML
42.5000 mg/m2 | Freq: Once | INTRAVENOUS | Status: AC
  Administered 2013-05-10: 75 mg via INTRAVENOUS
  Filled 2013-05-10: qty 15

## 2013-05-10 MED ORDER — DEXAMETHASONE SODIUM PHOSPHATE 10 MG/ML IJ SOLN
INTRAMUSCULAR | Status: AC
  Filled 2013-05-10: qty 1

## 2013-05-10 MED ORDER — SODIUM CHLORIDE 0.9 % IV SOLN
5.0000 mg/kg | Freq: Once | INTRAVENOUS | Status: AC
  Administered 2013-05-10: 350 mg via INTRAVENOUS
  Filled 2013-05-10: qty 14

## 2013-05-10 MED ORDER — ONDANSETRON 8 MG/50ML IVPB (CHCC)
8.0000 mg | Freq: Once | INTRAVENOUS | Status: AC
  Administered 2013-05-10: 8 mg via INTRAVENOUS

## 2013-05-10 MED ORDER — DEXTROSE 5 % IV SOLN: Freq: Once | INTRAVENOUS | Status: DC

## 2013-05-10 NOTE — Patient Instructions (Signed)
[facility] Discharge Instructions for Patients Receiving Chemotherapy  Today you received the following chemotherapy agents :  Avastin, Oxaliplatin, Leucovorin, 5FU.  To help prevent nausea and vomiting after your treatment, we encourage you to take your nausea medication as instructed by your physician.   If you develop nausea and vomiting that is not controlled by your nausea medication, call the clinic.   BELOW ARE SYMPTOMS THAT SHOULD BE REPORTED IMMEDIATELY:  *FEVER GREATER THAN 100.5 F  *CHILLS WITH OR WITHOUT FEVER  NAUSEA AND VOMITING THAT IS NOT CONTROLLED WITH YOUR NAUSEA MEDICATION  *UNUSUAL SHORTNESS OF BREATH  *UNUSUAL BRUISING OR BLEEDING  TENDERNESS IN MOUTH AND THROAT WITH OR WITHOUT PRESENCE OF ULCERS  *URINARY PROBLEMS  *BOWEL PROBLEMS  UNUSUAL RASH Items with * indicate a potential emergency and should be followed up as soon as possible.  Feel free to call the clinic you have any questions or concerns. The clinic phone number is [phone].

## 2013-05-10 NOTE — Progress Notes (Signed)
Hematology and Oncology Follow Up Visit  Margaret Elliott [id number] 07-Oct-1950 62 y.o.    Principle Diagnosis: This is a 62 year old woman diagnosed with colon cancer in June 2014. She had presented with an abdominal pain and found to have a cecal mass resulting in obstruction and advanced appendicitis.  She has at least stage III disease. Likely has stage IV disease with a liver mass.    Prior Therapy: She is status post laparoscopic laparotomy, evacuation of a pelvic abscess and right hemicolectomy with ileocolonic anastomosis done on December 09, 2012. The pathology showed an invasive, well- differentiated colorectal adenocarcinoma. Tumor invades through the muscularis propria, with 2/18 lymph nodes involved, with the pathological staging T3 N1b disease. Her tumor was found to be microsatellite stable. KRAS mutation is pending. She is also status post Port-A-Cath insertion that was done on 01/05/2013.   Current therapy: FOLFOX chemotherapy started 01/18/2013. Avastin to be added with cycle 2 on 02/01/2013. She is here for an evaluation before cycle 9.   Interim History:  Margaret Elliott presents today for a followup visit. She is a very pleasant 62-year with colon cancer as above. Tolerated the first 8 cycles of chemotherapy well. She is not reporting any abdominal pain. She does not report any diarrhea. Does not report any hematochezia. Does not report any melena. Does not report any nausea. Does not report any vomiting. Does not report any pelvic pain. Does not report any bony pain. 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 Her performance status and activity level remains excellent. She still works full-time and only Mrs. Wednesday for chemotherapy. Her appetite have been also excellent and she has gained weight. No recent hospitalizations or illnesses. She tolerated Norvasc without any complications. She reports her blood pressure is within normal range when she checks it at home. She does  report blood tinged nasal mucus at times.   Medications: I have reviewed the patient's current medications.  Current Outpatient Prescriptions  Medication Sig Dispense Refill  . acetaminophen (TYLENOL) 500 MG tablet Take 500 mg by mouth every 6 (six) hours as needed for pain.      . diphenhydrAMINE (BENADRYL) 25 mg capsule Take 25-50 mg by mouth daily as needed for itching. For allergic reaction      . lidocaine-prilocaine (EMLA) cream Apply 1 application topically as needed. Apply approx 1/2 tsp to skin over port, prior to chemotherapy treatments  1 kit  3  . Multiple Vitamin (MULTIVITAMIN) tablet Take 1 tablet by mouth daily.      . ondansetron (ZOFRAN) 8 MG tablet Take 1 tablet (8 mg total) by mouth every 8 (eight) hours as needed for nausea.  30 tablet  1   No current facility-administered medications for this visit.     Allergies: No Known Allergies  Past Medical History, Surgical history, Social history, and Family History were reviewed and updated.  Review of Systems:  Remaining ROS negative. Physical Exam:  Blood pressure 119/75, pulse 98, temperature 97.7 F (36.5 C), temperature source Oral, resp. rate 18, height 5\' 4"  (1.626 m), weight 152 lb (68.947 kg). ECOG: 0 General appearance: alert Head: Normocephalic, without obvious abnormality, atraumatic Neck: no adenopathy, no carotid bruit, no JVD, supple, symmetrical, trachea midline and thyroid not enlarged, symmetric, no tenderness/mass/nodules Lymph nodes: Cervical, supraclavicular, and axillary nodes normal. Heart:regular rate and rhythm, S1, S2 normal, no murmur, click, rub or gallop Lung:chest clear, no wheezing, rales, normal symmetric air entry Abdomen: soft, non-tender, without masses or organomegaly EXT:no erythema,  induration, or nodules Neurological: No deficits noted. Gait and mood are stable.   CBC    Component Value Date/Time   WBC 4.0 05/10/2013 0834   WBC 4.3 01/04/2013 0837   WBC 8.6 11/10/2011 1329    RBC 3.88 05/10/2013 0834   RBC 3.50* 01/04/2013 0837   RBC 3.95* 11/10/2011 1329   HGB 11.0* 05/10/2013 0834   HGB 9.4* 01/04/2013 0837   HGB 11.3* 11/10/2011 1329   HCT 33.9* 05/10/2013 0834   HCT 28.5* 01/04/2013 0837   HCT 35.8* 11/10/2011 1329   PLT 205 05/10/2013 0834   PLT 382 01/04/2013 0837   MCV 87.4 05/10/2013 0834   MCV 81.4 01/04/2013 0837   MCV 90.7 11/10/2011 1329   MCH 28.2 05/10/2013 0834   MCH 26.9 01/04/2013 0837   MCH 28.6 11/10/2011 1329   MCHC 32.3 05/10/2013 0834   MCHC 33.0 01/04/2013 0837   MCHC 31.6* 11/10/2011 1329   RDW 23.1* 05/10/2013 0834   RDW 15.8* 01/04/2013 0837   LYMPHSABS 1.4 05/10/2013 0834   LYMPHSABS 1.2 12/09/2012 1045   MONOABS 0.6 05/10/2013 0834   MONOABS 0.9 12/09/2012 1045   EOSABS 0.0 05/10/2013 0834   EOSABS 0.0 12/09/2012 1045   BASOSABS 0.0 05/10/2013 0834   BASOSABS 0.0 12/09/2012 1045     CT CHEST, ABDOMEN, AND PELVIS WITH CONTRAST  TECHNIQUE:  Multidetector CT imaging of the chest, abdomen and pelvis was  performed following the standard protocol during bolus  administration of intravenous contrast.  CONTRAST: OMNIPAQUE IOHEXOL 300 MG/ML SOLN  COMPARISON: CTs 03/13/2013. PET-CT 01/16/2013.  FINDINGS:  CT CHEST FINDINGS  Left subclavian Port-A-Cath tip is unchanged at the SVC right atrial  junction. A small nonobstructing fibrin sheath at the confluence of  the brachiocephalic veins (axial image 14) cannot be excluded. The  SVC is patent.  There are no pathologically enlarged mediastinal or hilar lymph  nodes. High density in the pre-vascular space is stable. There is no  pleural or pericardial effusion.  4 mm retrosternal left upper lobe nodule on image 27 is stable. No  new or enlarging pulmonary nodules are identified. There are no  worrisome osseous findings.  CT ABDOMEN AND PELVIS FINDINGS  The indistinct low-density lesion in the posterior segment of the  right hepatic lobe appears slightly smaller, measuring 10 mm  on  image 54 (previously 12 mm). This was hypermetabolic on PET-CT. No  new or enlarging hepatic lesions are identified. There are  additional tiny low-density liver lesions which are stable.  No focal splenic abnormalities are currently demonstrated. The  gallbladder, pancreas and adrenal glands appear normal. There are  stable bilateral renal cysts. There is no hydronephrosis.  The stomach, small bowel and remaining colon demonstrate no  significant findings. There is minimal atherosclerosis. No enlarged  abdominal pelvic lymph nodes are identified. There is no ascites or  peritoneal nodularity.  The uterus is surgically absent. There is a septated cystic lesion  in the right adnexa which measures 4.6 x 3.9 cm on image 97  (previously 4.9 x 3.2 cm). This was not hypermetabolic on PET-CT and  is possibly a hydrosalpinx. There is no left adnexal mass. The  bladder appears normal.  There are no worrisome osseous findings. Degenerative disc disease  is present at the lumbosacral junction.  IMPRESSION:  1. Stable 4 mm left upper lobe pulmonary nodule. This stability is  reassuring, although continued follow up is recommended to exclude  metastatic disease.  2. No other evidence of  thoracic metastatic disease.  3. Cannot exclude a small fibrin sheath associated with the  Port-A-Cath. No evidence of central venous obstruction.  4. The hypermetabolic lesion in the posterior segment of the right  hepatic lobe has slightly decreased in size. No other evidence of  abdominal metastatic disease.  5. Similar appearance of right adnexal lesion, possibly a  hydrosalpinx. This was not hypermetabolic on PET-CT.     Impression and Plan:  64 year old woman with the following issues:  1. Advanced Colon cancer. She presented with a large right cecal mass causing obstruction and abscess and appendicitis. PET/CT results indicate likely stage IV disease. CT scan on 05/08/2013 was reviewed and showed  continued response and her liver lesion and no other disease outside of that. She is here for cycle 9 and will proceed without any delay. The plan is to proceed if possible with total of 12 cycles of chemotherapy and she will likely need liver directed therapy for any remaining lesions which will give her the best chance of long term remission.  2. Intravenous access. PAC in place without complications.  3. Nausea prophylaxis. She has Zofran at home.  4. 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 This is stable with 50% dose reduction of oxaliplatin.  5. Abdominal protrusions/abscess: She is to follow with her dentist in the near future.   6. Follow up: in two weeks for cycle 10.   7. Hypertension: This is most likely related to Avastin. She is doing better with Norvasc.   [facility] MD 05/10/2013

## 2013-05-10 NOTE — Telephone Encounter (Signed)
Appts made per 11/19 POF CAL and AVS given to pt shh

## 2013-05-10 NOTE — Telephone Encounter (Signed)
Per staff message and POF I have scheduled appts.  JMW  

## 2013-05-12 ENCOUNTER — Ambulatory Visit (HOSPITAL_BASED_OUTPATIENT_CLINIC_OR_DEPARTMENT_OTHER): Payer: BC Managed Care – PPO

## 2013-05-12 VITALS — BP 131/86 | HR 78 | Temp 97.9°F | Resp 20

## 2013-05-12 DIAGNOSIS — Z452 Encounter for adjustment and management of vascular access device: Secondary | ICD-10-CM

## 2013-05-12 DIAGNOSIS — C189 Malignant neoplasm of colon, unspecified: Secondary | ICD-10-CM

## 2013-05-12 DIAGNOSIS — K6389 Other specified diseases of intestine: Secondary | ICD-10-CM

## 2013-05-12 DIAGNOSIS — C180 Malignant neoplasm of cecum: Secondary | ICD-10-CM

## 2013-05-12 DIAGNOSIS — K769 Liver disease, unspecified: Secondary | ICD-10-CM

## 2013-05-12 MED ORDER — HEPARIN SOD (PORK) LOCK FLUSH 100 UNIT/ML IV SOLN
500.0000 [IU] | Freq: Once | INTRAVENOUS | Status: AC | PRN
  Administered 2013-05-12: 500 [IU]
  Filled 2013-05-12: qty 5

## 2013-05-12 MED ORDER — SODIUM CHLORIDE 0.9 % IJ SOLN
10.0000 mL | INTRAMUSCULAR | Status: DC | PRN
  Administered 2013-05-12: 10 mL
  Filled 2013-05-12: qty 10

## 2013-05-12 NOTE — Patient Instructions (Signed)

## 2013-05-24 ENCOUNTER — Other Ambulatory Visit: Payer: Self-pay | Admitting: Oncology

## 2013-05-24 ENCOUNTER — Telehealth: Payer: Self-pay | Admitting: Oncology

## 2013-05-24 ENCOUNTER — Ambulatory Visit (HOSPITAL_BASED_OUTPATIENT_CLINIC_OR_DEPARTMENT_OTHER): Payer: BC Managed Care – PPO

## 2013-05-24 ENCOUNTER — Other Ambulatory Visit (HOSPITAL_BASED_OUTPATIENT_CLINIC_OR_DEPARTMENT_OTHER): Payer: BC Managed Care – PPO | Admitting: Lab

## 2013-05-24 ENCOUNTER — Other Ambulatory Visit: Payer: Self-pay | Admitting: *Deleted

## 2013-05-24 VITALS — BP 147/83 | HR 82 | Temp 97.8°F | Resp 18

## 2013-05-24 DIAGNOSIS — C189 Malignant neoplasm of colon, unspecified: Secondary | ICD-10-CM

## 2013-05-24 DIAGNOSIS — C180 Malignant neoplasm of cecum: Secondary | ICD-10-CM

## 2013-05-24 DIAGNOSIS — Z5112 Encounter for antineoplastic immunotherapy: Secondary | ICD-10-CM

## 2013-05-24 DIAGNOSIS — K769 Liver disease, unspecified: Secondary | ICD-10-CM

## 2013-05-24 DIAGNOSIS — K6389 Other specified diseases of intestine: Secondary | ICD-10-CM

## 2013-05-24 DIAGNOSIS — Z5111 Encounter for antineoplastic chemotherapy: Secondary | ICD-10-CM

## 2013-05-24 DIAGNOSIS — K7689 Other specified diseases of liver: Secondary | ICD-10-CM

## 2013-05-24 LAB — CBC WITH DIFFERENTIAL/PLATELET
EOS%: 2.6 % (ref 0.0–7.0)
Eosinophils Absolute: 0.1 10*3/uL (ref 0.0–0.5)
MCH: 28.4 pg (ref 25.1–34.0)
MCV: 88.3 fL (ref 79.5–101.0)
MONO%: 11.1 % (ref 0.0–14.0)
NEUT#: 1.7 10*3/uL (ref 1.5–6.5)
NEUT%: 45.1 % (ref 38.4–76.8)
RBC: 3.66 10*6/uL — ABNORMAL LOW (ref 3.70–5.45)
RDW: 19.5 % — ABNORMAL HIGH (ref 11.2–14.5)
lymph#: 1.6 10*3/uL (ref 0.9–3.3)

## 2013-05-24 LAB — COMPREHENSIVE METABOLIC PANEL (CC13)
AST: 18 U/L (ref 5–34)
Albumin: 3.3 g/dL — ABNORMAL LOW (ref 3.5–5.0)
Alkaline Phosphatase: 89 U/L (ref 40–150)
Potassium: 4.3 mEq/L (ref 3.5–5.1)
Sodium: 140 mEq/L (ref 136–145)
Total Protein: 7.4 g/dL (ref 6.4–8.3)

## 2013-05-24 MED ORDER — DEXAMETHASONE SODIUM PHOSPHATE 10 MG/ML IJ SOLN
INTRAMUSCULAR | Status: AC
  Filled 2013-05-24: qty 1

## 2013-05-24 MED ORDER — SODIUM CHLORIDE 0.9 % IV SOLN
Freq: Once | INTRAVENOUS | Status: AC
  Administered 2013-05-24: 10:00:00 via INTRAVENOUS

## 2013-05-24 MED ORDER — LEUCOVORIN CALCIUM INJECTION 350 MG
400.0000 mg/m2 | Freq: Once | INTRAVENOUS | Status: AC
  Administered 2013-05-24: 700 mg via INTRAVENOUS
  Filled 2013-05-24: qty 35

## 2013-05-24 MED ORDER — DIPHENOXYLATE-ATROPINE 2.5-0.025 MG PO TABS: 1.0000 | ORAL_TABLET | Freq: Four times a day (QID) | ORAL | Status: DC | PRN

## 2013-05-24 MED ORDER — SODIUM CHLORIDE 0.9 % IV SOLN
2400.0000 mg/m2 | INTRAVENOUS | Status: DC
  Administered 2013-05-24: 4200 mg via INTRAVENOUS
  Filled 2013-05-24: qty 84

## 2013-05-24 MED ORDER — FLUOROURACIL CHEMO INJECTION 2.5 GM/50ML
400.0000 mg/m2 | Freq: Once | INTRAVENOUS | Status: AC
  Administered 2013-05-24: 700 mg via INTRAVENOUS
  Filled 2013-05-24: qty 14

## 2013-05-24 MED ORDER — DEXTROSE 5 % IV SOLN
Freq: Once | INTRAVENOUS | Status: AC
  Administered 2013-05-24: 11:00:00 via INTRAVENOUS

## 2013-05-24 MED ORDER — ONDANSETRON 8 MG/50ML IVPB (CHCC)
8.0000 mg | Freq: Once | INTRAVENOUS | Status: AC
  Administered 2013-05-24: 8 mg via INTRAVENOUS

## 2013-05-24 MED ORDER — SODIUM CHLORIDE 0.9 % IV SOLN
5.0000 mg/kg | Freq: Once | INTRAVENOUS | Status: AC
  Administered 2013-05-24: 350 mg via INTRAVENOUS
  Filled 2013-05-24: qty 14

## 2013-05-24 MED ORDER — DEXAMETHASONE SODIUM PHOSPHATE 10 MG/ML IJ SOLN
10.0000 mg | Freq: Once | INTRAMUSCULAR | Status: AC
  Administered 2013-05-24: 10 mg via INTRAVENOUS

## 2013-05-24 MED ORDER — SODIUM CHLORIDE 0.9 % IJ SOLN
10.0000 mL | INTRAMUSCULAR | Status: DC | PRN
  Filled 2013-05-24: qty 10

## 2013-05-24 MED ORDER — ONDANSETRON 8 MG/NS 50 ML IVPB
INTRAVENOUS | Status: AC
  Filled 2013-05-24: qty 8

## 2013-05-24 MED ORDER — OXALIPLATIN CHEMO INJECTION 100 MG/20ML
42.5000 mg/m2 | Freq: Once | INTRAVENOUS | Status: AC
  Administered 2013-05-24: 75 mg via INTRAVENOUS
  Filled 2013-05-24: qty 15

## 2013-05-24 MED ORDER — HEPARIN SOD (PORK) LOCK FLUSH 100 UNIT/ML IV SOLN
500.0000 [IU] | Freq: Once | INTRAVENOUS | Status: DC | PRN
  Filled 2013-05-24: qty 5

## 2013-05-24 NOTE — Telephone Encounter (Signed)
Patient c/o diarrhea after last  folfox treatment. Per dr Clelia Croft, Called in lomotil to [address].

## 2013-05-24 NOTE — Patient Instructions (Signed)
[facility] Discharge Instructions for Patients Receiving Chemotherapy  Today you received the following chemotherapy agents Avastin, Oxaliplatin, leucovorin and Adrucil.  To help prevent nausea and vomiting after your treatment, we encourage you to take your nausea medication.   If you develop nausea and vomiting that is not controlled by your nausea medication, call the clinic.   BELOW ARE SYMPTOMS THAT SHOULD BE REPORTED IMMEDIATELY:  *FEVER GREATER THAN 100.5 F  *CHILLS WITH OR WITHOUT FEVER  NAUSEA AND VOMITING THAT IS NOT CONTROLLED WITH YOUR NAUSEA MEDICATION  *UNUSUAL SHORTNESS OF BREATH  *UNUSUAL BRUISING OR BLEEDING  TENDERNESS IN MOUTH AND THROAT WITH OR WITHOUT PRESENCE OF ULCERS  *URINARY PROBLEMS  *BOWEL PROBLEMS  UNUSUAL RASH Items with * indicate a potential emergency and should be followed up as soon as possible.  Feel free to call the clinic you have any questions or concerns. The clinic phone number is [phone].    

## 2013-05-24 NOTE — Progress Notes (Signed)
Pt states she only has pain in her rt side when she is lying down. She describes it as an aching pain and takes Tylenol as needed. She rates her pain a 5 on a pain scale of 0-10. She states Dr Clelia Croft feels the pain is related to surgery she had in June.

## 2013-05-24 NOTE — Telephone Encounter (Signed)
gv and pritned appt sched and avs for pt for DEC...sed adjusted time to tx.

## 2013-05-26 ENCOUNTER — Ambulatory Visit (HOSPITAL_BASED_OUTPATIENT_CLINIC_OR_DEPARTMENT_OTHER): Payer: BC Managed Care – PPO

## 2013-05-26 VITALS — BP 137/76 | HR 78 | Temp 97.4°F

## 2013-05-26 DIAGNOSIS — K6389 Other specified diseases of intestine: Secondary | ICD-10-CM

## 2013-05-26 DIAGNOSIS — K769 Liver disease, unspecified: Secondary | ICD-10-CM

## 2013-05-26 DIAGNOSIS — C180 Malignant neoplasm of cecum: Secondary | ICD-10-CM

## 2013-05-26 DIAGNOSIS — C189 Malignant neoplasm of colon, unspecified: Secondary | ICD-10-CM

## 2013-05-26 DIAGNOSIS — Z452 Encounter for adjustment and management of vascular access device: Secondary | ICD-10-CM

## 2013-05-26 MED ORDER — HEPARIN SOD (PORK) LOCK FLUSH 100 UNIT/ML IV SOLN
500.0000 [IU] | Freq: Once | INTRAVENOUS | Status: AC | PRN
  Administered 2013-05-26: 500 [IU]
  Filled 2013-05-26: qty 5

## 2013-05-26 MED ORDER — SODIUM CHLORIDE 0.9 % IJ SOLN
10.0000 mL | INTRAMUSCULAR | Status: DC | PRN
  Administered 2013-05-26: 10 mL
  Filled 2013-05-26: qty 10

## 2013-05-29 ENCOUNTER — Encounter: Payer: Self-pay | Admitting: *Deleted

## 2013-06-07 ENCOUNTER — Ambulatory Visit: Payer: BC Managed Care – PPO

## 2013-06-07 ENCOUNTER — Other Ambulatory Visit: Payer: BC Managed Care – PPO

## 2013-06-07 ENCOUNTER — Other Ambulatory Visit (HOSPITAL_BASED_OUTPATIENT_CLINIC_OR_DEPARTMENT_OTHER): Payer: BC Managed Care – PPO

## 2013-06-07 ENCOUNTER — Ambulatory Visit (HOSPITAL_BASED_OUTPATIENT_CLINIC_OR_DEPARTMENT_OTHER): Payer: BC Managed Care – PPO | Admitting: Oncology

## 2013-06-07 ENCOUNTER — Ambulatory Visit (HOSPITAL_BASED_OUTPATIENT_CLINIC_OR_DEPARTMENT_OTHER): Payer: BC Managed Care – PPO

## 2013-06-07 VITALS — BP 156/92 | HR 95 | Temp 97.6°F | Resp 20 | Ht 64.0 in | Wt 154.7 lb

## 2013-06-07 VITALS — BP 137/84 | HR 88

## 2013-06-07 DIAGNOSIS — Z5112 Encounter for antineoplastic immunotherapy: Secondary | ICD-10-CM

## 2013-06-07 DIAGNOSIS — C189 Malignant neoplasm of colon, unspecified: Secondary | ICD-10-CM

## 2013-06-07 DIAGNOSIS — C180 Malignant neoplasm of cecum: Secondary | ICD-10-CM

## 2013-06-07 DIAGNOSIS — K6389 Other specified diseases of intestine: Secondary | ICD-10-CM

## 2013-06-07 DIAGNOSIS — I10 Essential (primary) hypertension: Secondary | ICD-10-CM

## 2013-06-07 DIAGNOSIS — Z5111 Encounter for antineoplastic chemotherapy: Secondary | ICD-10-CM

## 2013-06-07 DIAGNOSIS — K7689 Other specified diseases of liver: Secondary | ICD-10-CM

## 2013-06-07 DIAGNOSIS — K769 Liver disease, unspecified: Secondary | ICD-10-CM

## 2013-06-07 DIAGNOSIS — G5690 Unspecified mononeuropathy of unspecified upper limb: Secondary | ICD-10-CM

## 2013-06-07 LAB — CBC WITH DIFFERENTIAL/PLATELET
BASO%: 0.9 % (ref 0.0–2.0)
Basophils Absolute: 0 10*3/uL (ref 0.0–0.1)
EOS%: 1.8 % (ref 0.0–7.0)
HCT: 33.8 % — ABNORMAL LOW (ref 34.8–46.6)
MCH: 30.1 pg (ref 25.1–34.0)
MCHC: 33 g/dL (ref 31.5–36.0)
NEUT%: 48.1 % (ref 38.4–76.8)
Platelets: 199 10*3/uL (ref 145–400)
RBC: 3.71 10*6/uL (ref 3.70–5.45)
WBC: 4.1 10*3/uL (ref 3.9–10.3)
lymph#: 1.5 10*3/uL (ref 0.9–3.3)

## 2013-06-07 LAB — COMPREHENSIVE METABOLIC PANEL (CC13)
ALT: 11 U/L (ref 0–55)
AST: 19 U/L (ref 5–34)
Calcium: 9.8 mg/dL (ref 8.4–10.4)
Chloride: 108 mEq/L (ref 98–109)
Creatinine: 0.9 mg/dL (ref 0.6–1.1)
Sodium: 141 mEq/L (ref 136–145)
Total Bilirubin: 0.61 mg/dL (ref 0.20–1.20)

## 2013-06-07 MED ORDER — LEUCOVORIN CALCIUM INJECTION 350 MG
400.0000 mg/m2 | Freq: Once | INTRAVENOUS | Status: AC
  Administered 2013-06-07: 700 mg via INTRAVENOUS
  Filled 2013-06-07: qty 35

## 2013-06-07 MED ORDER — DEXTROSE 5 % IV SOLN
Freq: Once | INTRAVENOUS | Status: AC
  Administered 2013-06-07: 11:00:00 via INTRAVENOUS

## 2013-06-07 MED ORDER — ONDANSETRON 8 MG/50ML IVPB (CHCC)
8.0000 mg | Freq: Once | INTRAVENOUS | Status: AC
  Administered 2013-06-07: 8 mg via INTRAVENOUS

## 2013-06-07 MED ORDER — DEXAMETHASONE SODIUM PHOSPHATE 10 MG/ML IJ SOLN
INTRAMUSCULAR | Status: AC
  Filled 2013-06-07: qty 1

## 2013-06-07 MED ORDER — ONDANSETRON 8 MG/NS 50 ML IVPB
INTRAVENOUS | Status: AC
  Filled 2013-06-07: qty 8

## 2013-06-07 MED ORDER — SODIUM CHLORIDE 0.9 % IV SOLN
2400.0000 mg/m2 | INTRAVENOUS | Status: DC
  Administered 2013-06-07: 4200 mg via INTRAVENOUS
  Filled 2013-06-07: qty 84

## 2013-06-07 MED ORDER — HEPARIN SOD (PORK) LOCK FLUSH 100 UNIT/ML IV SOLN
500.0000 [IU] | Freq: Once | INTRAVENOUS | Status: DC | PRN
  Filled 2013-06-07: qty 5

## 2013-06-07 MED ORDER — DEXAMETHASONE SODIUM PHOSPHATE 10 MG/ML IJ SOLN
10.0000 mg | Freq: Once | INTRAMUSCULAR | Status: AC
  Administered 2013-06-07: 10 mg via INTRAVENOUS

## 2013-06-07 MED ORDER — SODIUM CHLORIDE 0.9 % IJ SOLN
10.0000 mL | INTRAMUSCULAR | Status: DC | PRN
  Filled 2013-06-07: qty 10

## 2013-06-07 MED ORDER — FLUOROURACIL CHEMO INJECTION 2.5 GM/50ML
400.0000 mg/m2 | Freq: Once | INTRAVENOUS | Status: AC
  Administered 2013-06-07: 700 mg via INTRAVENOUS
  Filled 2013-06-07: qty 14

## 2013-06-07 MED ORDER — SODIUM CHLORIDE 0.9 % IV SOLN
5.0000 mg/kg | Freq: Once | INTRAVENOUS | Status: AC
  Administered 2013-06-07: 350 mg via INTRAVENOUS
  Filled 2013-06-07: qty 14

## 2013-06-07 NOTE — Progress Notes (Signed)
Hematology and Oncology Follow Up Visit  Margaret Elliott [id number] 1951/04/24 62 y.o.    Principle Diagnosis: This is a 62 year old woman diagnosed with colon cancer in June 2014. She had presented with an abdominal pain and found to have a cecal mass resulting in obstruction and advanced appendicitis.  She has at least stage III disease. Likely has stage IV disease with a liver mass.    Prior Therapy: She is status post laparoscopic laparotomy, evacuation of a pelvic abscess and right hemicolectomy with ileocolonic anastomosis done on December 09, 2012. The pathology showed an invasive, well- differentiated colorectal adenocarcinoma. Tumor invades through the muscularis propria, with 2/18 lymph nodes involved, with the pathological staging T3 N1b disease. Her tumor was found to be microsatellite stable. KRAS mutation is pending. She is also status post Port-A-Cath insertion that was done on 01/05/2013.   Current therapy: FOLFOX chemotherapy started 01/18/2013. Avastin to be added with cycle 2 on 02/01/2013. She is here for an evaluation before cycle 11.   Interim History:  Ms. Kinnick presents today for a followup visit. She is a very pleasant 62-year with colon cancer as above. Tolerated the first 10 cycles of chemotherapy well. She is not reporting any abdominal pain. She does not report any diarrhea. Does not report any hematochezia. Does not report any melena. Does not report any nausea. Does not report any vomiting. Does not report any pelvic pain. Does not report any bony pain. She is reporting grade two motor neuropathy. Her performance status and activity level remains excellent. She still works full-time and only [employer] for chemotherapy. Her appetite have been also excellent and she has gained weight. No recent hospitalizations or illnesses. She tolerated Norvasc without any complications. She reports her blood pressure is within normal range when she checks it at home. She does report  blood tinged nasal mucus at times. 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   Medications: I have reviewed the patient's current medications.  Current Outpatient Prescriptions  Medication Sig Dispense Refill  . acetaminophen (TYLENOL) 500 MG tablet Take 500 mg by mouth every 6 (six) hours as needed for pain.      . diphenhydrAMINE (BENADRYL) 25 mg capsule Take 25-50 mg by mouth daily as needed for itching. For allergic reaction      . lidocaine-prilocaine (EMLA) cream Apply 1 application topically as needed. Apply approx 1/2 tsp to skin over port, prior to chemotherapy treatments  1 kit  3  . Multiple Vitamin (MULTIVITAMIN) tablet Take 1 tablet by mouth daily.      . ondansetron (ZOFRAN) 8 MG tablet Take 1 tablet (8 mg total) by mouth every 8 (eight) hours as needed for nausea.  30 tablet  1   No current facility-administered medications for this visit.     Allergies: No Known Allergies  Past Medical History, Surgical history, Social history, and Family History were reviewed and updated.  Review of Systems:  Remaining ROS negative. Physical Exam:  Blood pressure 119/75, pulse 98, temperature 97.7 F (36.5 C), temperature source Oral, resp. rate 18, height 5\' 4"  (1.626 m), weight 152 lb (68.947 kg). ECOG: 0 General appearance: alert Head: Normocephalic, without obvious abnormality, atraumatic Neck: no adenopathy, no carotid bruit, no JVD, supple, symmetrical, trachea midline and thyroid not enlarged, symmetric, no tenderness/mass/nodules Lymph nodes: Cervical, supraclavicular, and axillary nodes normal. Heart:regular rate and rhythm, S1, S2 normal, no murmur, click, rub or gallop Lung:chest clear, no wheezing, rales,  normal symmetric air entry Abdomen: soft, non-tender, without masses or organomegaly EXT:no erythema, induration, or nodules Neurological: No deficits noted. Gait and mood are stable.   CBC    Component  Value Date/Time   WBC 4.1 06/07/2013 1003   WBC 4.3 01/04/2013 0837   WBC 8.6 11/10/2011 1329   RBC 3.71 06/07/2013 1003   RBC 3.50* 01/04/2013 0837   RBC 3.95* 11/10/2011 1329   HGB 11.2* 06/07/2013 1003   HGB 9.4* 01/04/2013 0837   HGB 11.3* 11/10/2011 1329   HCT 33.8* 06/07/2013 1003   HCT 28.5* 01/04/2013 0837   HCT 35.8* 11/10/2011 1329   PLT 199 06/07/2013 1003   PLT 382 01/04/2013 0837   MCV 91.2 06/07/2013 1003   MCV 81.4 01/04/2013 0837   MCV 90.7 11/10/2011 1329   MCH 30.1 06/07/2013 1003   MCH 26.9 01/04/2013 0837   MCH 28.6 11/10/2011 1329   MCHC 33.0 06/07/2013 1003   MCHC 33.0 01/04/2013 0837   MCHC 31.6* 11/10/2011 1329   RDW 21.5* 06/07/2013 1003   RDW 15.8* 01/04/2013 0837   LYMPHSABS 1.5 06/07/2013 1003   LYMPHSABS 1.2 12/09/2012 1045   MONOABS 0.6 06/07/2013 1003   MONOABS 0.9 12/09/2012 1045   EOSABS 0.1 06/07/2013 1003   EOSABS 0.0 12/09/2012 1045   BASOSABS 0.0 06/07/2013 1003   BASOSABS 0.0 12/09/2012 1045      Impression and Plan:  62 year old woman with the following issues:  1. Advanced Colon cancer. She presented with a large right cecal mass causing obstruction and abscess and appendicitis. PET/CT results indicate likely stage IV disease. CT scan on 05/08/2013 showed continued response and her liver lesion and no other disease outside of that. She is here for cycle 11 and will proceed without any delay. The plan is to proceed if possible with total of 12 cycles of chemotherapy and she will likely need liver directed therapy for any remaining lesions which will give her the best chance of long term remission.  2. Intravenous access. PAC in place without complications.  3. Nausea prophylaxis. She has Zofran at home.  4. Neuropathy: She has developed worsening neuropathy now and for that reason I will omit oxaliplatin from her regimen.  5. Abdominal protrusions/abscess: She is to follow with her dentist in the near future.   6. Follow up: in two weeks for cycle  12.   7. Hypertension: This is most likely related to Avastin. She is doing better with Norvasc.   [facility] MD 06/07/2013

## 2013-06-07 NOTE — Progress Notes (Signed)
Vital signs stable, pre and post Avastin; patient tolerated well.

## 2013-06-09 ENCOUNTER — Ambulatory Visit (HOSPITAL_BASED_OUTPATIENT_CLINIC_OR_DEPARTMENT_OTHER): Payer: BC Managed Care – PPO

## 2013-06-09 VITALS — BP 138/73 | HR 74 | Temp 97.6°F | Resp 18

## 2013-06-09 DIAGNOSIS — C189 Malignant neoplasm of colon, unspecified: Secondary | ICD-10-CM

## 2013-06-09 DIAGNOSIS — K769 Liver disease, unspecified: Secondary | ICD-10-CM

## 2013-06-09 DIAGNOSIS — K6389 Other specified diseases of intestine: Secondary | ICD-10-CM

## 2013-06-09 MED ORDER — SODIUM CHLORIDE 0.9 % IJ SOLN
10.0000 mL | INTRAMUSCULAR | Status: DC | PRN
  Administered 2013-06-09: 10 mL
  Filled 2013-06-09: qty 10

## 2013-06-09 MED ORDER — HEPARIN SOD (PORK) LOCK FLUSH 100 UNIT/ML IV SOLN
500.0000 [IU] | Freq: Once | INTRAVENOUS | Status: AC | PRN
  Administered 2013-06-09: 500 [IU]
  Filled 2013-06-09: qty 5

## 2013-06-09 NOTE — Patient Instructions (Signed)

## 2013-06-21 ENCOUNTER — Other Ambulatory Visit (HOSPITAL_BASED_OUTPATIENT_CLINIC_OR_DEPARTMENT_OTHER): Payer: BC Managed Care – PPO

## 2013-06-21 ENCOUNTER — Ambulatory Visit (HOSPITAL_BASED_OUTPATIENT_CLINIC_OR_DEPARTMENT_OTHER): Payer: BC Managed Care – PPO

## 2013-06-21 ENCOUNTER — Ambulatory Visit (HOSPITAL_BASED_OUTPATIENT_CLINIC_OR_DEPARTMENT_OTHER): Payer: BC Managed Care – PPO | Admitting: Oncology

## 2013-06-21 VITALS — BP 144/82 | HR 85 | Temp 97.5°F | Resp 20 | Ht 64.0 in | Wt 157.9 lb

## 2013-06-21 DIAGNOSIS — C189 Malignant neoplasm of colon, unspecified: Secondary | ICD-10-CM

## 2013-06-21 DIAGNOSIS — C180 Malignant neoplasm of cecum: Secondary | ICD-10-CM

## 2013-06-21 DIAGNOSIS — C787 Secondary malignant neoplasm of liver and intrahepatic bile duct: Secondary | ICD-10-CM

## 2013-06-21 DIAGNOSIS — K6389 Other specified diseases of intestine: Secondary | ICD-10-CM

## 2013-06-21 DIAGNOSIS — Z5111 Encounter for antineoplastic chemotherapy: Secondary | ICD-10-CM

## 2013-06-21 DIAGNOSIS — G5690 Unspecified mononeuropathy of unspecified upper limb: Secondary | ICD-10-CM

## 2013-06-21 DIAGNOSIS — I10 Essential (primary) hypertension: Secondary | ICD-10-CM

## 2013-06-21 DIAGNOSIS — Z5112 Encounter for antineoplastic immunotherapy: Secondary | ICD-10-CM

## 2013-06-21 DIAGNOSIS — K769 Liver disease, unspecified: Secondary | ICD-10-CM

## 2013-06-21 LAB — CBC WITH DIFFERENTIAL/PLATELET
BASO%: 0.7 % (ref 0.0–2.0)
Basophils Absolute: 0 10*3/uL (ref 0.0–0.1)
EOS%: 1.6 % (ref 0.0–7.0)
HGB: 11.5 g/dL — ABNORMAL LOW (ref 11.6–15.9)
MCH: 30.6 pg (ref 25.1–34.0)
MCHC: 33.1 g/dL (ref 31.5–36.0)
MCV: 92.3 fL (ref 79.5–101.0)
MONO#: 0.4 10*3/uL (ref 0.1–0.9)
RBC: 3.75 10*6/uL (ref 3.70–5.45)
RDW: 20.4 % — ABNORMAL HIGH (ref 11.2–14.5)
WBC: 4 10*3/uL (ref 3.9–10.3)
lymph#: 1.4 10*3/uL (ref 0.9–3.3)

## 2013-06-21 LAB — COMPREHENSIVE METABOLIC PANEL (CC13)
ALT: 13 U/L (ref 0–55)
AST: 17 U/L (ref 5–34)
Albumin: 3.4 g/dL — ABNORMAL LOW (ref 3.5–5.0)
Calcium: 9.4 mg/dL (ref 8.4–10.4)
Chloride: 106 mEq/L (ref 98–109)
Potassium: 4.4 mEq/L (ref 3.5–5.1)
Sodium: 139 mEq/L (ref 136–145)
Total Bilirubin: 0.68 mg/dL (ref 0.20–1.20)
Total Protein: 7.5 g/dL (ref 6.4–8.3)

## 2013-06-21 MED ORDER — ONDANSETRON 8 MG/50ML IVPB (CHCC)
8.0000 mg | Freq: Once | INTRAVENOUS | Status: AC
  Administered 2013-06-21: 8 mg via INTRAVENOUS

## 2013-06-21 MED ORDER — BEVACIZUMAB CHEMO INJECTION 400 MG/16ML
5.0000 mg/kg | Freq: Once | INTRAVENOUS | Status: AC
  Administered 2013-06-21: 350 mg via INTRAVENOUS
  Filled 2013-06-21: qty 14

## 2013-06-21 MED ORDER — ONDANSETRON 8 MG/NS 50 ML IVPB
INTRAVENOUS | Status: AC
  Filled 2013-06-21: qty 8

## 2013-06-21 MED ORDER — DEXAMETHASONE SODIUM PHOSPHATE 10 MG/ML IJ SOLN
10.0000 mg | Freq: Once | INTRAMUSCULAR | Status: AC
  Administered 2013-06-21: 10 mg via INTRAVENOUS

## 2013-06-21 MED ORDER — SODIUM CHLORIDE 0.9 % IV SOLN
2400.0000 mg/m2 | INTRAVENOUS | Status: DC
  Administered 2013-06-21: 4200 mg via INTRAVENOUS
  Filled 2013-06-21: qty 84

## 2013-06-21 MED ORDER — DEXAMETHASONE SODIUM PHOSPHATE 10 MG/ML IJ SOLN
INTRAMUSCULAR | Status: AC
  Filled 2013-06-21: qty 1

## 2013-06-21 MED ORDER — SODIUM CHLORIDE 0.9 % IV SOLN
Freq: Once | INTRAVENOUS | Status: AC
  Administered 2013-06-21: 14:00:00 via INTRAVENOUS

## 2013-06-21 MED ORDER — FLUOROURACIL CHEMO INJECTION 2.5 GM/50ML
400.0000 mg/m2 | Freq: Once | INTRAVENOUS | Status: AC
  Administered 2013-06-21: 700 mg via INTRAVENOUS
  Filled 2013-06-21: qty 14

## 2013-06-21 MED ORDER — LEUCOVORIN CALCIUM INJECTION 350 MG
400.0000 mg/m2 | Freq: Once | INTRAVENOUS | Status: AC
  Administered 2013-06-21: 700 mg via INTRAVENOUS
  Filled 2013-06-21: qty 35

## 2013-06-21 NOTE — Patient Instructions (Signed)
[facility] Discharge Instructions for Patients Receiving Chemotherapy  Today you received the following chemotherapy agents Avastin, Leucovorin and Adrucil.  To help prevent nausea and vomiting after your treatment, we encourage you to take your nausea medication as prescribed.   If you develop nausea and vomiting that is not controlled by your nausea medication, call the clinic.   BELOW ARE SYMPTOMS THAT SHOULD BE REPORTED IMMEDIATELY:  *FEVER GREATER THAN 100.5 F  *CHILLS WITH OR WITHOUT FEVER  NAUSEA AND VOMITING THAT IS NOT CONTROLLED WITH YOUR NAUSEA MEDICATION  *UNUSUAL SHORTNESS OF BREATH  *UNUSUAL BRUISING OR BLEEDING  TENDERNESS IN MOUTH AND THROAT WITH OR WITHOUT PRESENCE OF ULCERS  *URINARY PROBLEMS  *BOWEL PROBLEMS  UNUSUAL RASH Items with * indicate a potential emergency and should be followed up as soon as possible.  Feel free to call the clinic you have any questions or concerns. The clinic phone number is [phone].    

## 2013-06-21 NOTE — Progress Notes (Signed)
Hematology and Oncology Follow Up Visit  Margaret Elliott [id number] July 11, 1950 62 y.o.    Principle Diagnosis: This is a 62 year old woman diagnosed with colon cancer in June 2014. She had presented with an abdominal pain and found to have a cecal mass resulting in obstruction and advanced appendicitis.  She has at least stage III disease. Likely has stage IV disease with a liver mass.    Prior Therapy: She is status post laparoscopic laparotomy, evacuation of a pelvic abscess and right hemicolectomy with ileocolonic anastomosis done on December 09, 2012. The pathology showed an invasive, well- differentiated colorectal adenocarcinoma. Tumor invades through the muscularis propria, with 2/18 lymph nodes involved, with the pathological staging T3 N1b disease. Her tumor was found to be microsatellite stable. KRAS mutation is pending. She is also status post Port-A-Cath insertion that was done on 01/05/2013.   Current therapy: FOLFOX chemotherapy started 01/18/2013. Avastin to be added with cycle 2 on 02/01/2013. She is here for an evaluation before cycle 12.   Interim History:  Margaret Elliott presents today for a followup visit. She is a very pleasant 62-year with colon cancer as above. Tolerated the first 11 cycles of chemotherapy well. She is not reporting any abdominal pain. She does not report any diarrhea. Does not report any hematochezia. Does not report any melena. Does not report any nausea. Does not report any vomiting. Does not report any pelvic pain. Does not report any bony pain. She is reporting grade two motor neuropathy which seems to be slightly worse. Her performance status and activity level remains excellent. She still works full-time and only [employer] for chemotherapy. Her appetite have been also excellent and she has gained weight. No recent hospitalizations or illnesses. She tolerated Norvasc without any complications. She reports her blood pressure is within normal range when she  checks it at home. She does report blood tinged nasal mucus at times. 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 She still able to perform activities of daily living without any hindrance or decline.   Medications: I have reviewed the patient's current medications.  Current Outpatient Prescriptions  Medication Sig Dispense Refill  . acetaminophen (TYLENOL) 500 MG tablet Take 500 mg by mouth every 6 (six) hours as needed for pain.      . diphenhydrAMINE (BENADRYL) 25 mg capsule Take 25-50 mg by mouth daily as needed for itching. For allergic reaction      . lidocaine-prilocaine (EMLA) cream Apply 1 application topically as needed. Apply approx 1/2 tsp to skin over port, prior to chemotherapy treatments  1 kit  3  . Multiple Vitamin (MULTIVITAMIN) tablet Take 1 tablet by mouth daily.      . ondansetron (ZOFRAN) 8 MG tablet Take 1 tablet (8 mg total) by mouth every 8 (eight) hours as needed for nausea.  30 tablet  1   No current facility-administered medications for this visit.     Allergies: No Known Allergies  Past Medical History, Surgical history, Social history, and Family History were reviewed and updated.  Review of Systems:  Remaining ROS negative. Physical Exam:  Blood pressure 119/75, pulse 98, temperature 97.7 F (36.5 C), temperature source Oral, resp. rate 18, height 5\' 4"  (1.626 m), weight 152 lb (68.947 kg). ECOG: 0 General appearance: alert Head: Normocephalic, without obvious abnormality, atraumatic Neck: no adenopathy, no carotid bruit, no JVD, supple, symmetrical, trachea midline and thyroid not enlarged, symmetric, no tenderness/mass/nodules Lymph nodes: Cervical, supraclavicular, and axillary  nodes normal. Heart:regular rate and rhythm, S1, S2 normal, no murmur, click, rub or gallop Lung:chest clear, no wheezing, rales, normal symmetric air entry Abdomen: soft, non-tender, without masses or  organomegaly EXT:no erythema, induration, or nodules Neurological: No deficits noted. Gait and mood are stable.   CBC    Component Value Date/Time   WBC 4.0 06/21/2013 1015   WBC 4.3 01/04/2013 0837   WBC 8.6 11/10/2011 1329   RBC 3.75 06/21/2013 1015   RBC 3.50* 01/04/2013 0837   RBC 3.95* 11/10/2011 1329   HGB 11.5* 06/21/2013 1015   HGB 9.4* 01/04/2013 0837   HGB 11.3* 11/10/2011 1329   HCT 34.6* 06/21/2013 1015   HCT 28.5* 01/04/2013 0837   HCT 35.8* 11/10/2011 1329   PLT 198 06/21/2013 1015   PLT 382 01/04/2013 0837   MCV 92.3 06/21/2013 1015   MCV 81.4 01/04/2013 0837   MCV 90.7 11/10/2011 1329   MCH 30.6 06/21/2013 1015   MCH 26.9 01/04/2013 0837   MCH 28.6 11/10/2011 1329   MCHC 33.1 06/21/2013 1015   MCHC 33.0 01/04/2013 0837   MCHC 31.6* 11/10/2011 1329   RDW 20.4* 06/21/2013 1015   RDW 15.8* 01/04/2013 0837   LYMPHSABS 1.4 06/21/2013 1015   LYMPHSABS 1.2 12/09/2012 1045   MONOABS 0.4 06/21/2013 1015   MONOABS 0.9 12/09/2012 1045   EOSABS 0.1 06/21/2013 1015   EOSABS 0.0 12/09/2012 1045   BASOSABS 0.0 06/21/2013 1015   BASOSABS 0.0 12/09/2012 1045      Impression and Plan:  62 year old woman with the following issues:  1. Advanced Colon cancer. She presented with a large right cecal mass causing obstruction and abscess and appendicitis. PET/CT results indicate likely stage IV disease. CT scan on 05/08/2013 showed continued response and her liver lesion and no other disease outside of that. She is here for cycle 11 and will proceed without any delay. The plan is to proceed if possible with total of 12 cycles of chemotherapy and she will likely need liver directed therapy for any remaining lesions which will give her the best chance of long term remission.  2. Intravenous access. PAC in place without complications.  3. Nausea prophylaxis. She has Zofran at home.  4. Neuropathy: She has developed worsening neuropathy now and for that reason I will omit oxaliplatin from her  regimen.  5. Abdominal protrusions/abscess: She is to follow with her dentist in the near future.   6. Follow up: in two weeks for cycle 12.   7. Hypertension: This is most likely related to Avastin. She is doing better with Norvasc.   [facility] MD 06/21/2013

## 2013-06-23 ENCOUNTER — Ambulatory Visit (HOSPITAL_BASED_OUTPATIENT_CLINIC_OR_DEPARTMENT_OTHER): Payer: BC Managed Care – PPO

## 2013-06-23 VITALS — BP 111/66 | HR 68 | Temp 97.2°F

## 2013-06-23 DIAGNOSIS — K6389 Other specified diseases of intestine: Secondary | ICD-10-CM

## 2013-06-23 DIAGNOSIS — C180 Malignant neoplasm of cecum: Secondary | ICD-10-CM

## 2013-06-23 DIAGNOSIS — C189 Malignant neoplasm of colon, unspecified: Secondary | ICD-10-CM

## 2013-06-23 DIAGNOSIS — K769 Liver disease, unspecified: Secondary | ICD-10-CM

## 2013-06-23 DIAGNOSIS — C787 Secondary malignant neoplasm of liver and intrahepatic bile duct: Secondary | ICD-10-CM

## 2013-06-23 MED ORDER — SODIUM CHLORIDE 0.9 % IJ SOLN
10.0000 mL | INTRAMUSCULAR | Status: DC | PRN
  Administered 2013-06-23: 10 mL
  Filled 2013-06-23: qty 10

## 2013-06-23 MED ORDER — HEPARIN SOD (PORK) LOCK FLUSH 100 UNIT/ML IV SOLN
500.0000 [IU] | Freq: Once | INTRAVENOUS | Status: AC | PRN
  Administered 2013-06-23: 500 [IU]
  Filled 2013-06-23: qty 5

## 2013-07-04 ENCOUNTER — Encounter (HOSPITAL_COMMUNITY): Payer: Self-pay

## 2013-07-04 ENCOUNTER — Ambulatory Visit (HOSPITAL_COMMUNITY)
Admission: RE | Admit: 2013-07-04 | Discharge: 2013-07-04 | Disposition: A | Discharge status: Home / Self Care | Payer: BC Managed Care – PPO | Source: Ambulatory Visit | Attending: Oncology | Admitting: Oncology

## 2013-07-04 DIAGNOSIS — K7689 Other specified diseases of liver: Secondary | ICD-10-CM | POA: Insufficient documentation

## 2013-07-04 DIAGNOSIS — N839 Noninflammatory disorder of ovary, fallopian tube and broad ligament, unspecified: Secondary | ICD-10-CM | POA: Insufficient documentation

## 2013-07-04 DIAGNOSIS — R911 Solitary pulmonary nodule: Secondary | ICD-10-CM | POA: Insufficient documentation

## 2013-07-04 DIAGNOSIS — Z90710 Acquired absence of both cervix and uterus: Secondary | ICD-10-CM | POA: Insufficient documentation

## 2013-07-04 DIAGNOSIS — Z9221 Personal history of antineoplastic chemotherapy: Secondary | ICD-10-CM | POA: Insufficient documentation

## 2013-07-04 DIAGNOSIS — N281 Cyst of kidney, acquired: Secondary | ICD-10-CM | POA: Insufficient documentation

## 2013-07-04 DIAGNOSIS — C189 Malignant neoplasm of colon, unspecified: Secondary | ICD-10-CM

## 2013-07-04 MED ORDER — IOHEXOL 300 MG/ML  SOLN
100.0000 mL | Freq: Once | INTRAMUSCULAR | Status: AC | PRN
  Administered 2013-07-04: 100 mL via INTRAVENOUS

## 2013-07-06 ENCOUNTER — Encounter: Payer: Self-pay | Admitting: Oncology

## 2013-07-06 ENCOUNTER — Other Ambulatory Visit (HOSPITAL_BASED_OUTPATIENT_CLINIC_OR_DEPARTMENT_OTHER): Payer: BC Managed Care – PPO

## 2013-07-06 ENCOUNTER — Ambulatory Visit (HOSPITAL_BASED_OUTPATIENT_CLINIC_OR_DEPARTMENT_OTHER): Payer: BC Managed Care – PPO | Admitting: Oncology

## 2013-07-06 ENCOUNTER — Telehealth: Payer: Self-pay | Admitting: Oncology

## 2013-07-06 ENCOUNTER — Ambulatory Visit: Payer: BC Managed Care – PPO | Admitting: Oncology

## 2013-07-06 ENCOUNTER — Inpatient Hospital Stay: Payer: BC Managed Care – PPO

## 2013-07-06 VITALS — BP 137/77 | HR 103 | Temp 97.1°F | Resp 18 | Ht 64.0 in | Wt 158.4 lb

## 2013-07-06 DIAGNOSIS — C180 Malignant neoplasm of cecum: Secondary | ICD-10-CM

## 2013-07-06 DIAGNOSIS — I10 Essential (primary) hypertension: Secondary | ICD-10-CM

## 2013-07-06 DIAGNOSIS — G5790 Unspecified mononeuropathy of unspecified lower limb: Secondary | ICD-10-CM

## 2013-07-06 DIAGNOSIS — C189 Malignant neoplasm of colon, unspecified: Secondary | ICD-10-CM

## 2013-07-06 DIAGNOSIS — K6389 Other specified diseases of intestine: Secondary | ICD-10-CM

## 2013-07-06 DIAGNOSIS — K769 Liver disease, unspecified: Secondary | ICD-10-CM

## 2013-07-06 LAB — COMPREHENSIVE METABOLIC PANEL (CC13)
ALBUMIN: 3.5 g/dL (ref 3.5–5.0)
ALK PHOS: 76 U/L (ref 40–150)
ALT: 11 U/L (ref 0–55)
AST: 17 U/L (ref 5–34)
Anion Gap: 10 mEq/L (ref 3–11)
BUN: 18.1 mg/dL (ref 7.0–26.0)
CALCIUM: 9.5 mg/dL (ref 8.4–10.4)
CHLORIDE: 106 meq/L (ref 98–109)
CO2: 25 mEq/L (ref 22–29)
Creatinine: 1.1 mg/dL (ref 0.6–1.1)
Glucose: 108 mg/dl (ref 70–140)
POTASSIUM: 4.3 meq/L (ref 3.5–5.1)
Sodium: 140 mEq/L (ref 136–145)
Total Bilirubin: 0.79 mg/dL (ref 0.20–1.20)
Total Protein: 7.7 g/dL (ref 6.4–8.3)

## 2013-07-06 LAB — CBC WITH DIFFERENTIAL/PLATELET
BASO%: 0.6 % (ref 0.0–2.0)
Basophils Absolute: 0 10*3/uL (ref 0.0–0.1)
EOS%: 2.3 % (ref 0.0–7.0)
Eosinophils Absolute: 0.1 10*3/uL (ref 0.0–0.5)
HEMATOCRIT: 35.9 % (ref 34.8–46.6)
HGB: 11.8 g/dL (ref 11.6–15.9)
LYMPH%: 36.4 % (ref 14.0–49.7)
MCH: 30.7 pg (ref 25.1–34.0)
MCHC: 32.9 g/dL (ref 31.5–36.0)
MCV: 93 fL (ref 79.5–101.0)
MONO#: 0.5 10*3/uL (ref 0.1–0.9)
MONO%: 11.6 % (ref 0.0–14.0)
NEUT%: 49.1 % (ref 38.4–76.8)
NEUTROS ABS: 2.1 10*3/uL (ref 1.5–6.5)
Platelets: 183 10*3/uL (ref 145–400)
RBC: 3.86 10*6/uL (ref 3.70–5.45)
RDW: 19.6 % — AB (ref 11.2–14.5)
WBC: 4.2 10*3/uL (ref 3.9–10.3)
lymph#: 1.5 10*3/uL (ref 0.9–3.3)

## 2013-07-06 NOTE — Telephone Encounter (Signed)
gv adn printed appt sched and avs for pt for Feb and April...Margaret Elliott.w Dr. Alen Blew he advised me not to sched the IR appt they will just contact pt...he advised pt to wait for IR and cs call also...  gv pt barium

## 2013-07-06 NOTE — Progress Notes (Signed)
Hematology and Oncology Follow Up Visit  Margaret Elliott [id number] Jun 25, 1950 63 y.o.    Principle Diagnosis: This is a 63 year old woman diagnosed with colon cancer in June 2014. She had presented with an abdominal pain and found to have a cecal mass resulting in obstruction and advanced appendicitis.  She has at least stage III disease. Likely has stage IV disease with a liver mass.    Prior Therapy: She is status post laparoscopic laparotomy, evacuation of a pelvic abscess and right hemicolectomy with ileocolonic anastomosis done on December 09, 2012. The pathology showed an invasive, well- differentiated colorectal adenocarcinoma. Tumor invades through the muscularis propria, with 2/18 lymph nodes involved, with the pathological staging T3 N1b disease. Her tumor was found to be microsatellite stable. KRAS mutation is pending. She is also status post Port-A-Cath insertion that was done on 01/05/2013. FOLFOX chemotherapy started 01/18/2013. Avastin to be added with cycle 2 on 02/01/2013. She is S/P 12 cycles completed in 05/2013.   Current therapy: Observation and surveillance. Under consideration for liver directed therapy.  Interim History:  Margaret Elliott presents today for a followup visit. She is a very pleasant 62-year with colon cancer as above. Tolerated the first 12 cycles of chemotherapy well. She is not reporting any abdominal pain. She does not report any diarrhea. Does not report any hematochezia. Does not report any melena. Does not report any nausea. Does not report any vomiting. Does not report any pelvic pain. Does not report any bony pain. She is reporting grade two motor neuropathy which seems to be better. Her performance status and activity level remains excellent. She still works full-time and only [employer] for chemotherapy. Her appetite have been also excellent and she has gained weight. No recent hospitalizations or illnesses. She tolerated Norvasc without any complications. She  reports her blood pressure is within normal range when she checks it at home. She does report blood tinged nasal mucus at times. 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 She still able to perform activities of daily living without any hindrance or decline.   Medications: I have reviewed the patient's current medications.  Current Outpatient Prescriptions  Medication Sig Dispense Refill  . acetaminophen (TYLENOL) 500 MG tablet Take 500 mg by mouth every 6 (six) hours as needed for pain.      . diphenhydrAMINE (BENADRYL) 25 mg capsule Take 25-50 mg by mouth daily as needed for itching. For allergic reaction      . lidocaine-prilocaine (EMLA) cream Apply 1 application topically as needed. Apply approx 1/2 tsp to skin over port, prior to chemotherapy treatments  1 kit  3  . Multiple Vitamin (MULTIVITAMIN) tablet Take 1 tablet by mouth daily.      . ondansetron (ZOFRAN) 8 MG tablet Take 1 tablet (8 mg total) by mouth every 8 (eight) hours as needed for nausea.  30 tablet  1   No current facility-administered medications for this visit.     Allergies: No Known Allergies  Past Medical History, Surgical history, Social history, and Family History were reviewed and updated.  Review of Systems:  Remaining ROS negative. Physical Exam:  Blood pressure 119/75, pulse 98, temperature 97.7 F (36.5 C), temperature source Oral, resp. rate 18, height $RemoveBe'5\' 4"'syzEvpRVA$  (1.626 m), weight 152 lb (68.947 kg). ECOG: 0 General appearance: alert Head: Normocephalic, without obvious abnormality, atraumatic Neck: no adenopathy, no carotid bruit, no JVD, supple, symmetrical, trachea midline and thyroid not enlarged, symmetric, no tenderness/mass/nodules  Lymph nodes: Cervical, supraclavicular, and axillary nodes normal. Heart:regular rate and rhythm, S1, S2 normal, no murmur, click, rub or gallop Lung:chest clear, no wheezing, rales, normal symmetric air  entry Abdomen: soft, non-tender, without masses or organomegaly EXT:no erythema, induration, or nodules Neurological: No deficits noted. Gait and mood are stable.   CBC    Component Value Date/Time   WBC 4.2 07/06/2013 0807   WBC 4.3 01/04/2013 0837   WBC 8.6 11/10/2011 1329   RBC 3.86 07/06/2013 0807   RBC 3.50* 01/04/2013 0837   RBC 3.95* 11/10/2011 1329   HGB 11.8 07/06/2013 0807   HGB 9.4* 01/04/2013 0837   HGB 11.3* 11/10/2011 1329   HCT 35.9 07/06/2013 0807   HCT 28.5* 01/04/2013 0837   HCT 35.8* 11/10/2011 1329   PLT 183 07/06/2013 0807   PLT 382 01/04/2013 0837   MCV 93.0 07/06/2013 0807   MCV 81.4 01/04/2013 0837   MCV 90.7 11/10/2011 1329   MCH 30.7 07/06/2013 0807   MCH 26.9 01/04/2013 0837   MCH 28.6 11/10/2011 1329   MCHC 32.9 07/06/2013 0807   MCHC 33.0 01/04/2013 0837   MCHC 31.6* 11/10/2011 1329   RDW 19.6* 07/06/2013 0807   RDW 15.8* 01/04/2013 0837   LYMPHSABS 1.5 07/06/2013 0807   LYMPHSABS 1.2 12/09/2012 1045   MONOABS 0.5 07/06/2013 0807   MONOABS 0.9 12/09/2012 1045   EOSABS 0.1 07/06/2013 0807   EOSABS 0.0 12/09/2012 1045   BASOSABS 0.0 07/06/2013 0807   BASOSABS 0.0 12/09/2012 1045    EXAM:  CT CHEST, ABDOMEN, AND PELVIS WITH CONTRAST  TECHNIQUE:  Multidetector CT imaging of the chest, abdomen and pelvis was  performed following the standard protocol during bolus  administration of intravenous contrast.  CONTRAST: OMNIPAQUE IOHEXOL 300 MG/ML SOLN  COMPARISON: 05/08/2013  FINDINGS:  CT CHEST FINDINGS  No evidence of mediastinal or hilar masses. No adenopathy seen  elsewhere within the thorax. No evidence of pleural or pericardial  effusion.  A 4 mm noncalcified pulmonary nodule is again seen in the anterior  and medial left upper lobe on image 28 which is stable since  previous study. No other suspicious pulmonary nodules or masses are  identified. No evidence of pulmonary infiltrate or central  endobronchial lesion. No evidence chest wall mass or suspicious  bone  lesions.  Left-sided power port remains in appropriate position, and  previously noted fibrin sheath associated with the catheter in the  SVC is no longer visualized.  CT ABDOMEN AND PELVIS FINDINGS  A ill-defined low-attenuation lesion in the posterior right hepatic  lobe on image 54 shows further decreased in size, now measuring 8 mm  compared with 10 mm on previous study. The shows hypermetabolic  activity on prior PET-CT. Other tiny less than 1 cm low-attenuation  lesions in the left hepatic lobe remains stable. No new or enlarging  liver lesions are identified.  The gallbladder, pancreas, spleen, and adrenal glands are normal in  appearance. Right renal cyst is stable. No evidence of renal masses  or hydronephrosis.  Prior hysterectomy noted. Cystic lesion in the right adnexa showing  a thin internal septation now measures 2.7 x 3.6 cm on image 99,  which is decreased in size from 3.9 x 4.6 cm on previous study. This  showed no hypermetabolic activity on recent PET-CT. No other pelvic  masses are identified. No lymphadenopathy identified within the  abdomen or pelvis. No evidence of inflammatory process or abnormal  fluid collections. No evidence of bowel wall  thickening or  dilatation. No suspicious bone lesions identified.  IMPRESSION:  Stable 4 mm left upper lobe pulmonary nodule.  Further slight decrease in size of small low-attenuation lesion in  the posterior right hepatic lobe, which showed hypermetabolic  activity on previous PET-CT.  Decreased size of right adnexal cystic lesion, which is presumably  benign.  No new or progressive disease within the chest, abdomen, or pelvis.   Impression and Plan:  63 year old woman with the following issues:  1. Advanced Colon cancer. She presented with a large right cecal mass causing obstruction and abscess and appendicitis. She is status post 12 cycles of chemotherapy with CT scan on 07/04/2013 discussed today  showed  continued response and her liver lesion and no other disease outside of that.  Options of therapy were discussed today with the patient and I feel that further systemic chemotherapy will add very little to her over all disease. She has developed peripheral neuropathy and further chemotherapy can exacerbate that. I feel that liver directed therapy to the residual disease would be the way to go and I will refer her for intervention radiology for an evaluation without cryoablation versus radioembolization.  2. Intravenous access. PAC in place without complications. We will flush every 6 weeks.  3. Nausea prophylaxis. She has Zofran at home.  4. Neuropathy: She has developed worsening neuropathy but now is getting better off oxaliplatin.  5. Abdominal protrusions/abscess: She is to follow with her dentist in the near future.   6. Follow up: in 3 months with a CT scan.  7. Hypertension: This is most likely related to Avastin. She is doing better with Norvasc.   [facility] MD 07/06/2013

## 2013-07-19 ENCOUNTER — Ambulatory Visit
Admission: RE | Admit: 2013-07-19 | Discharge: 2013-07-19 | Disposition: A | Discharge status: Home / Self Care | Payer: BC Managed Care – PPO | Source: Ambulatory Visit | Attending: Oncology | Admitting: Oncology

## 2013-07-19 DIAGNOSIS — K6389 Other specified diseases of intestine: Secondary | ICD-10-CM

## 2013-07-19 DIAGNOSIS — C189 Malignant neoplasm of colon, unspecified: Secondary | ICD-10-CM

## 2013-08-21 ENCOUNTER — Ambulatory Visit (HOSPITAL_BASED_OUTPATIENT_CLINIC_OR_DEPARTMENT_OTHER): Payer: BC Managed Care – PPO

## 2013-08-21 VITALS — BP 142/99 | HR 72 | Temp 96.5°F | Resp 18

## 2013-08-21 DIAGNOSIS — C180 Malignant neoplasm of cecum: Secondary | ICD-10-CM

## 2013-08-21 DIAGNOSIS — Z452 Encounter for adjustment and management of vascular access device: Secondary | ICD-10-CM

## 2013-08-21 DIAGNOSIS — Z95828 Presence of other vascular implants and grafts: Secondary | ICD-10-CM

## 2013-08-21 MED ORDER — HEPARIN SOD (PORK) LOCK FLUSH 100 UNIT/ML IV SOLN
500.0000 [IU] | Freq: Once | INTRAVENOUS | Status: AC
  Administered 2013-08-21: 500 [IU] via INTRAVENOUS
  Filled 2013-08-21: qty 5

## 2013-08-21 MED ORDER — SODIUM CHLORIDE 0.9 % IJ SOLN
10.0000 mL | INTRAMUSCULAR | Status: DC | PRN
  Administered 2013-08-21: 10 mL via INTRAVENOUS
  Filled 2013-08-21: qty 10

## 2013-08-21 NOTE — Patient Instructions (Signed)

## 2013-09-04 ENCOUNTER — Other Ambulatory Visit: Payer: Self-pay | Admitting: Medical Oncology

## 2013-09-04 ENCOUNTER — Other Ambulatory Visit: Payer: Self-pay | Admitting: Oncology

## 2013-09-04 DIAGNOSIS — C189 Malignant neoplasm of colon, unspecified: Secondary | ICD-10-CM

## 2013-09-07 ENCOUNTER — Encounter: Payer: Self-pay | Admitting: Gastroenterology

## 2013-09-12 ENCOUNTER — Ambulatory Visit (HOSPITAL_COMMUNITY)
Admission: RE | Admit: 2013-09-12 | Discharge: 2013-09-12 | Disposition: A | Discharge status: Home / Self Care | Payer: BC Managed Care – PPO | Source: Ambulatory Visit | Attending: Oncology | Admitting: Oncology

## 2013-09-12 ENCOUNTER — Other Ambulatory Visit: Payer: Self-pay | Admitting: Oncology

## 2013-09-12 DIAGNOSIS — C189 Malignant neoplasm of colon, unspecified: Secondary | ICD-10-CM

## 2013-09-12 DIAGNOSIS — R911 Solitary pulmonary nodule: Secondary | ICD-10-CM | POA: Insufficient documentation

## 2013-09-12 DIAGNOSIS — N281 Cyst of kidney, acquired: Secondary | ICD-10-CM | POA: Insufficient documentation

## 2013-09-12 DIAGNOSIS — K449 Diaphragmatic hernia without obstruction or gangrene: Secondary | ICD-10-CM | POA: Insufficient documentation

## 2013-09-12 DIAGNOSIS — Z90710 Acquired absence of both cervix and uterus: Secondary | ICD-10-CM | POA: Insufficient documentation

## 2013-09-12 DIAGNOSIS — Z9221 Personal history of antineoplastic chemotherapy: Secondary | ICD-10-CM | POA: Insufficient documentation

## 2013-09-12 LAB — GLUCOSE, CAPILLARY: GLUCOSE-CAPILLARY: 89 mg/dL (ref 70–99)

## 2013-09-12 MED ORDER — FLUDEOXYGLUCOSE F - 18 (FDG) INJECTION
8.9000 | Freq: Once | INTRAVENOUS | Status: AC | PRN
  Administered 2013-09-12: 8.9 via INTRAVENOUS

## 2013-09-29 ENCOUNTER — Encounter: Payer: Self-pay | Admitting: *Deleted

## 2013-09-29 ENCOUNTER — Telehealth: Payer: Self-pay | Admitting: *Deleted

## 2013-09-29 DIAGNOSIS — C189 Malignant neoplasm of colon, unspecified: Secondary | ICD-10-CM

## 2013-09-29 NOTE — Telephone Encounter (Signed)
Patient calling to say she had a PET scan on 09/12/13.  Does she still need to have CT scan on 10/04/13?

## 2013-09-29 NOTE — Progress Notes (Signed)
CT scan for  10/04/13 cancelled, will keep lab appt on 10/04/13, as tumor marker and cbc ordered also, patient aware.

## 2013-10-04 ENCOUNTER — Ambulatory Visit (HOSPITAL_BASED_OUTPATIENT_CLINIC_OR_DEPARTMENT_OTHER): Payer: BC Managed Care – PPO

## 2013-10-04 ENCOUNTER — Other Ambulatory Visit: Payer: BC Managed Care – PPO

## 2013-10-04 ENCOUNTER — Ambulatory Visit (HOSPITAL_COMMUNITY): Payer: BC Managed Care – PPO

## 2013-10-04 ENCOUNTER — Encounter: Payer: Self-pay | Admitting: *Deleted

## 2013-10-04 VITALS — BP 117/72 | HR 84

## 2013-10-04 DIAGNOSIS — C180 Malignant neoplasm of cecum: Secondary | ICD-10-CM

## 2013-10-04 DIAGNOSIS — Z95828 Presence of other vascular implants and grafts: Secondary | ICD-10-CM

## 2013-10-04 DIAGNOSIS — K6389 Other specified diseases of intestine: Secondary | ICD-10-CM

## 2013-10-04 DIAGNOSIS — C189 Malignant neoplasm of colon, unspecified: Secondary | ICD-10-CM

## 2013-10-04 LAB — COMPREHENSIVE METABOLIC PANEL (CC13)
ALT: 8 U/L (ref 0–55)
ANION GAP: 7 meq/L (ref 3–11)
AST: 15 U/L (ref 5–34)
Albumin: 3.4 g/dL — ABNORMAL LOW (ref 3.5–5.0)
Alkaline Phosphatase: 83 U/L (ref 40–150)
BILIRUBIN TOTAL: 0.49 mg/dL (ref 0.20–1.20)
BUN: 16.2 mg/dL (ref 7.0–26.0)
CALCIUM: 9.4 mg/dL (ref 8.4–10.4)
CHLORIDE: 108 meq/L (ref 98–109)
CO2: 25 meq/L (ref 22–29)
Creatinine: 0.9 mg/dL (ref 0.6–1.1)
Glucose: 89 mg/dl (ref 70–140)
Potassium: 4.1 mEq/L (ref 3.5–5.1)
SODIUM: 140 meq/L (ref 136–145)
Total Protein: 7.3 g/dL (ref 6.4–8.3)

## 2013-10-04 LAB — CBC WITH DIFFERENTIAL/PLATELET
BASO%: 0.5 % (ref 0.0–2.0)
Basophils Absolute: 0 10*3/uL (ref 0.0–0.1)
EOS%: 5.3 % (ref 0.0–7.0)
Eosinophils Absolute: 0.2 10*3/uL (ref 0.0–0.5)
HCT: 36.2 % (ref 34.8–46.6)
HGB: 11.9 g/dL (ref 11.6–15.9)
LYMPH#: 1.5 10*3/uL (ref 0.9–3.3)
LYMPH%: 36.3 % (ref 14.0–49.7)
MCH: 30.9 pg (ref 25.1–34.0)
MCHC: 33 g/dL (ref 31.5–36.0)
MCV: 93.7 fL (ref 79.5–101.0)
MONO#: 0.4 10*3/uL (ref 0.1–0.9)
MONO%: 8.8 % (ref 0.0–14.0)
NEUT#: 2 10*3/uL (ref 1.5–6.5)
NEUT%: 49.1 % (ref 38.4–76.8)
Platelets: 242 10*3/uL (ref 145–400)
RBC: 3.86 10*6/uL (ref 3.70–5.45)
RDW: 13.8 % (ref 11.2–14.5)
WBC: 4.2 10*3/uL (ref 3.9–10.3)

## 2013-10-04 LAB — CEA: CEA: 1 ng/mL (ref 0.0–5.0)

## 2013-10-04 MED ORDER — SODIUM CHLORIDE 0.9 % IJ SOLN
10.0000 mL | INTRAMUSCULAR | Status: DC | PRN
  Administered 2013-10-04: 10 mL via INTRAVENOUS
  Filled 2013-10-04: qty 10

## 2013-10-04 MED ORDER — HEPARIN SOD (PORK) LOCK FLUSH 100 UNIT/ML IV SOLN
500.0000 [IU] | Freq: Once | INTRAVENOUS | Status: AC
  Administered 2013-10-04: 500 [IU] via INTRAVENOUS
  Filled 2013-10-04: qty 5

## 2013-10-05 ENCOUNTER — Encounter: Payer: Self-pay | Admitting: Oncology

## 2013-10-05 ENCOUNTER — Telehealth: Payer: Self-pay | Admitting: Oncology

## 2013-10-05 ENCOUNTER — Ambulatory Visit (HOSPITAL_BASED_OUTPATIENT_CLINIC_OR_DEPARTMENT_OTHER): Payer: BC Managed Care – PPO | Admitting: Oncology

## 2013-10-05 VITALS — BP 150/82 | HR 75 | Temp 96.0°F | Resp 18 | Ht 64.0 in | Wt 165.4 lb

## 2013-10-05 DIAGNOSIS — C180 Malignant neoplasm of cecum: Secondary | ICD-10-CM

## 2013-10-05 DIAGNOSIS — G5690 Unspecified mononeuropathy of unspecified upper limb: Secondary | ICD-10-CM

## 2013-10-05 DIAGNOSIS — I10 Essential (primary) hypertension: Secondary | ICD-10-CM

## 2013-10-05 DIAGNOSIS — G5790 Unspecified mononeuropathy of unspecified lower limb: Secondary | ICD-10-CM

## 2013-10-05 DIAGNOSIS — C189 Malignant neoplasm of colon, unspecified: Secondary | ICD-10-CM

## 2013-10-05 DIAGNOSIS — K7689 Other specified diseases of liver: Secondary | ICD-10-CM

## 2013-10-05 NOTE — Telephone Encounter (Signed)
gv adn printed appt sched and avs for pt fro May and July

## 2013-10-05 NOTE — Progress Notes (Signed)
Hematology and Oncology Follow Up Visit  Margaret Elliott [id number] Oct 31, 1950 63 y.o.    Principle Diagnosis: This is a 63 year old woman diagnosed with colon cancer in June 2014. She had presented with an abdominal pain and found to have a cecal mass resulting in obstruction and advanced appendicitis.  She has at least stage III disease. Likely has stage IV disease with a liver mass.    Prior Therapy: She is status post laparoscopic laparotomy, evacuation of a pelvic abscess and right hemicolectomy with ileocolonic anastomosis done on December 09, 2012. The pathology showed an invasive, well- differentiated colorectal adenocarcinoma. Tumor invades through the muscularis propria, with 2/18 lymph nodes involved, with the pathological staging T3 N1b disease. Her tumor was found to be microsatellite stable. KRAS mutation is pending. She is also status post Port-A-Cath insertion that was done on 01/05/2013. FOLFOX chemotherapy started 01/18/2013. Avastin to be added with cycle 2 on 02/01/2013. She is S/P 12 cycles completed in 05/2013.   Current therapy: Observation and surveillance. Under consideration for liver directed therapy.  Interim History:  Ms. Mazariego presents today for a followup visit. She is a very pleasant 62-year with colon cancer as above. Tolerated chemotherapy well without any residual complications. 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 She is not reporting any abdominal pain. She does not report any diarrhea. Does not report any hematochezia. Does not report any melena. Does not report any nausea. Does not report any vomiting. Does not report any pelvic pain. Does not report any bony pain.    Her performance status and activity level remains excellent. She still works full-time. Her appetite have been also excellent and she has gained weight. No recent hospitalizations or illnesses. She tolerated Norvasc without any complications. She reports her blood  pressure is within normal range when she checks it at home.    Medications: I have reviewed the patient's current medications.  Current Outpatient Prescriptions  Medication Sig Dispense Refill  . acetaminophen (TYLENOL) 500 MG tablet Take 500 mg by mouth every 6 (six) hours as needed for pain.      . diphenhydrAMINE (BENADRYL) 25 mg capsule Take 25-50 mg by mouth daily as needed for itching. For allergic reaction      . lidocaine-prilocaine (EMLA) cream Apply 1 application topically as needed. Apply approx 1/2 tsp to skin over port, prior to chemotherapy treatments  1 kit  3  . Multiple Vitamin (MULTIVITAMIN) tablet Take 1 tablet by mouth daily.      . ondansetron (ZOFRAN) 8 MG tablet Take 1 tablet (8 mg total) by mouth every 8 (eight) hours as needed for nausea.  30 tablet  1   No current facility-administered medications for this visit.     Allergies: No Known Allergies  Past Medical History, Surgical history, Social history, and Family History were reviewed and updated.  Review of Systems:  Remaining ROS negative.  Physical Exam:  Blood pressure 119/75, pulse 98, temperature 97.7 F (36.5 C), temperature source Oral, resp. rate 18, height _0  (1.626 m), weight 152 lb (68.947 kg). ECOG: 0 General appearance: alert awake appeared in no distress. Head: Normocephalic, without obvious abnormality, atraumatic Neck: no adenopathy, no carotid bruit, no JVD, supple, symmetrical, trachea midline and thyroid not enlarged, symmetric, no tenderness/mass/nodules Lymph nodes: Cervical, supraclavicular, and axillary nodes normal. Heart:regular rate and rhythm, S1, S2 normal, no murmur, click, rub or gallop Lung:chest clear, no wheezing, rales, normal symmetric air entry Abdomen: soft, non-tender, without masses or  organomegaly EXT:no erythema, induration, or nodules Neurological: No deficits noted. Gait and mood are stable.   CBC    Component Value Date/Time   WBC 4.2 10/04/2013 0839    WBC 4.3 01/04/2013 0837   WBC 8.6 11/10/2011 1329   RBC 3.86 10/04/2013 0839   RBC 3.50* 01/04/2013 0837   RBC 3.95* 11/10/2011 1329   HGB 11.9 10/04/2013 0839   HGB 9.4* 01/04/2013 0837   HGB 11.3* 11/10/2011 1329   HCT 36.2 10/04/2013 0839   HCT 28.5* 01/04/2013 0837   HCT 35.8* 11/10/2011 1329   PLT 242 10/04/2013 0839   PLT 382 01/04/2013 0837   MCV 93.7 10/04/2013 0839   MCV 81.4 01/04/2013 0837   MCV 90.7 11/10/2011 1329   MCH 30.9 10/04/2013 0839   MCH 26.9 01/04/2013 0837   MCH 28.6 11/10/2011 1329   MCHC 33.0 10/04/2013 0839   MCHC 33.0 01/04/2013 0837   MCHC 31.6* 11/10/2011 1329   RDW 13.8 10/04/2013 0839   RDW 15.8* 01/04/2013 0837   LYMPHSABS 1.5 10/04/2013 0839   LYMPHSABS 1.2 12/09/2012 1045   MONOABS 0.4 10/04/2013 0839   MONOABS 0.9 12/09/2012 1045   EOSABS 0.2 10/04/2013 0839   EOSABS 0.0 12/09/2012 1045   BASOSABS 0.0 10/04/2013 0839   BASOSABS 0.0 12/09/2012 1045    EXAM:  NUCLEAR MEDICINE PET SKULL BASE TO THIGH  TECHNIQUE:  8.9 mCi F-18 FDG was injected intravenously. Full-ring PET imaging  was performed from the skull base to thigh after the radiotracer. CT  data was obtained and used for attenuation correction and anatomic  localization.  FASTING BLOOD GLUCOSE: Value: 89 mg/dl  COMPARISON: NM PET IMAGE INITIAL (PI) SKULL BASE TO THIGH dated  01/16/2013; CT ABD/PELVIS W CM dated 07/04/2013; US PELVIS COMPLETE  dated 09/30/2012  FINDINGS:  NECK  No areas of abnormal hypermetabolism.  CHEST  Hypermetabolic "[place]ark" fat about the paraspinous regions of the  upper chest. This is mild. No abnormal nodal activity.  ABDOMEN/PELVIS  No residual hypermetabolism within the posterior right hepatic lobe.  No new areas of hepatic hypermetabolism.  SKELETON  Right-sided facet degenerative activity within the lumbar spine.  Mild rotator cuff tendinopathy.  CT IMAGES PERFORMED FOR ATTENUATION CORRECTION  Small hiatal hernia. A left-sided Port-A-Cath which terminates at  the mid SVC. 4  mm anterior left upper lobe lung nodule on image  30/series 8. Present back to 01/16/2013.  Interpolar right renal cyst. Retroaortic left renal vein. Pelvic  floor laxity. Hysterectomy. Low-density right adnexal "Lesion" which  is unchanged and not hypermetabolic. Appears cylindrical on the  prior contrast-enhanced study. Probable bone island in the sixth  posterior lateral left rib on image 63, chronic.  IMPRESSION:  1. Resolution of hypermetabolism within the right lobe of the liver.  No evidence of hypermetabolic new or progressive disease.  2. Resolution of the previously described hypermetabolism along the  left subclavian vein. Likely related to the Port-A-Cath on the  prior.  3. Stable 4 mm left upper lobe lung nodule. Recommend attention on  follow-up.  4. Right adnexal low-density "Lesion" is unchanged and not FDG avid.  Given appearance of prior contrast-enhanced CT, this may represent  hydrosalpinx.    Impression and Plan:  63 year old woman with the following issues:  1. Advanced Colon cancer. She presented with a large right cecal mass causing obstruction and abscess and appendicitis. She is status post 12 cycles of chemotherapy with CT scan on 07/04/2013 showed possible residual liver disease. A PET/CT scan on  09/12/2013 was discussed today and showed no residual disease actually in the liver that is hypermetabolic. Options of therapy were discussed today and liver directed therapy is really not indicated at this point given the fact that she has no residual disease. The plan is to continue observation and surveillance at this time.  2. Intravenous access. PAC in place without complications. We will flush every 6 weeks.  3. Nausea prophylaxis. She has Zofran at home this is no longer an issue appear  4. Neuropathy: This is improving slowly after the completion of systemic chemotherapy.  5. Hypertension: This is most likely related to Avastin. She is doing better with  Norvasc.  6. followup: Will be in 3 months for a clinical visit. I will repeat imaging studies in 6 months or sooner if needed to.   Wyatt Portela MD 10/05/2013

## 2013-11-15 ENCOUNTER — Ambulatory Visit (HOSPITAL_BASED_OUTPATIENT_CLINIC_OR_DEPARTMENT_OTHER): Payer: BC Managed Care – PPO

## 2013-11-15 VITALS — BP 111/62 | HR 94 | Temp 97.1°F

## 2013-11-15 DIAGNOSIS — C180 Malignant neoplasm of cecum: Secondary | ICD-10-CM

## 2013-11-15 DIAGNOSIS — Z95828 Presence of other vascular implants and grafts: Secondary | ICD-10-CM

## 2013-11-15 DIAGNOSIS — C787 Secondary malignant neoplasm of liver and intrahepatic bile duct: Secondary | ICD-10-CM

## 2013-11-15 DIAGNOSIS — Z452 Encounter for adjustment and management of vascular access device: Secondary | ICD-10-CM

## 2013-11-15 MED ORDER — HEPARIN SOD (PORK) LOCK FLUSH 100 UNIT/ML IV SOLN
500.0000 [IU] | Freq: Once | INTRAVENOUS | Status: AC
  Administered 2013-11-15: 500 [IU] via INTRAVENOUS
  Filled 2013-11-15: qty 5

## 2013-11-15 MED ORDER — SODIUM CHLORIDE 0.9 % IJ SOLN
10.0000 mL | INTRAMUSCULAR | Status: DC | PRN
  Administered 2013-11-15: 10 mL via INTRAVENOUS
  Filled 2013-11-15: qty 10

## 2014-01-03 ENCOUNTER — Encounter: Payer: Self-pay | Admitting: Oncology

## 2014-01-03 ENCOUNTER — Other Ambulatory Visit (HOSPITAL_BASED_OUTPATIENT_CLINIC_OR_DEPARTMENT_OTHER): Payer: BC Managed Care – PPO

## 2014-01-03 ENCOUNTER — Ambulatory Visit (HOSPITAL_BASED_OUTPATIENT_CLINIC_OR_DEPARTMENT_OTHER): Payer: BC Managed Care – PPO | Admitting: Oncology

## 2014-01-03 ENCOUNTER — Telehealth: Payer: Self-pay | Admitting: Oncology

## 2014-01-03 ENCOUNTER — Ambulatory Visit: Payer: BC Managed Care – PPO

## 2014-01-03 VITALS — BP 142/82 | HR 87 | Temp 98.4°F | Resp 18 | Ht 64.0 in | Wt 169.9 lb

## 2014-01-03 DIAGNOSIS — Z95828 Presence of other vascular implants and grafts: Secondary | ICD-10-CM

## 2014-01-03 DIAGNOSIS — I10 Essential (primary) hypertension: Secondary | ICD-10-CM

## 2014-01-03 DIAGNOSIS — M545 Low back pain, unspecified: Secondary | ICD-10-CM

## 2014-01-03 DIAGNOSIS — C180 Malignant neoplasm of cecum: Secondary | ICD-10-CM

## 2014-01-03 DIAGNOSIS — G589 Mononeuropathy, unspecified: Secondary | ICD-10-CM

## 2014-01-03 DIAGNOSIS — C189 Malignant neoplasm of colon, unspecified: Secondary | ICD-10-CM

## 2014-01-03 DIAGNOSIS — K769 Liver disease, unspecified: Secondary | ICD-10-CM

## 2014-01-03 LAB — CBC WITH DIFFERENTIAL/PLATELET
BASO%: 0.3 % (ref 0.0–2.0)
BASOS ABS: 0 10*3/uL (ref 0.0–0.1)
EOS%: 4.5 % (ref 0.0–7.0)
Eosinophils Absolute: 0.2 10*3/uL (ref 0.0–0.5)
HCT: 36.5 % (ref 34.8–46.6)
HEMOGLOBIN: 12.1 g/dL (ref 11.6–15.9)
LYMPH%: 36.1 % (ref 14.0–49.7)
MCH: 30.7 pg (ref 25.1–34.0)
MCHC: 33.2 g/dL (ref 31.5–36.0)
MCV: 92.6 fL (ref 79.5–101.0)
MONO#: 0.3 10*3/uL (ref 0.1–0.9)
MONO%: 8.4 % (ref 0.0–14.0)
NEUT#: 1.9 10*3/uL (ref 1.5–6.5)
NEUT%: 50.7 % (ref 38.4–76.8)
Platelets: 244 10*3/uL (ref 145–400)
RBC: 3.94 10*6/uL (ref 3.70–5.45)
RDW: 13.6 % (ref 11.2–14.5)
WBC: 3.8 10*3/uL — ABNORMAL LOW (ref 3.9–10.3)
lymph#: 1.4 10*3/uL (ref 0.9–3.3)

## 2014-01-03 LAB — COMPREHENSIVE METABOLIC PANEL (CC13)
ALK PHOS: 77 U/L (ref 40–150)
ALT: 10 U/L (ref 0–55)
AST: 15 U/L (ref 5–34)
Albumin: 3.4 g/dL — ABNORMAL LOW (ref 3.5–5.0)
Anion Gap: 10 mEq/L (ref 3–11)
BILIRUBIN TOTAL: 0.86 mg/dL (ref 0.20–1.20)
BUN: 21.9 mg/dL (ref 7.0–26.0)
CO2: 23 mEq/L (ref 22–29)
CREATININE: 1.1 mg/dL (ref 0.6–1.1)
Calcium: 9.4 mg/dL (ref 8.4–10.4)
Chloride: 108 mEq/L (ref 98–109)
Glucose: 116 mg/dl (ref 70–140)
POTASSIUM: 3.7 meq/L (ref 3.5–5.1)
Sodium: 140 mEq/L (ref 136–145)
Total Protein: 7.4 g/dL (ref 6.4–8.3)

## 2014-01-03 LAB — CEA: CEA: 0.7 ng/mL (ref 0.0–5.0)

## 2014-01-03 MED ORDER — HEPARIN SOD (PORK) LOCK FLUSH 100 UNIT/ML IV SOLN
500.0000 [IU] | Freq: Once | INTRAVENOUS | Status: AC
  Administered 2014-01-03: 500 [IU] via INTRAVENOUS
  Filled 2014-01-03: qty 5

## 2014-01-03 MED ORDER — SODIUM CHLORIDE 0.9 % IJ SOLN
10.0000 mL | INTRAMUSCULAR | Status: DC | PRN
  Administered 2014-01-03: 10 mL via INTRAVENOUS
  Filled 2014-01-03: qty 10

## 2014-01-03 NOTE — Telephone Encounter (Signed)
[facility] PRINTED APPT SCHED TO PT FOR aUG AND oct....S.W LUZ @ [facility] AND THEY WILL CALL BACK WITH APPT. I WILL CONTACT PT

## 2014-01-03 NOTE — Patient Instructions (Signed)

## 2014-01-03 NOTE — Telephone Encounter (Signed)
Faxed pt medical records to [facility]

## 2014-01-03 NOTE — Progress Notes (Signed)
Hematology and Oncology Follow Up Visit  Margaret Elliott [id number] Aug 13, 1950 63 y.o.    Principle Diagnosis: This is a 63 year old woman diagnosed with colon cancer in June 2014. She had presented with an abdominal pain and found to have a cecal mass resulting in obstruction and advanced appendicitis.  She has at least stage III disease. Likely has stage IV disease with a liver mass.    Prior Therapy: She is status post laparoscopic laparotomy, evacuation of a pelvic abscess and right hemicolectomy with ileocolonic anastomosis done on December 09, 2012. The pathology showed an invasive, well- differentiated colorectal adenocarcinoma. Tumor invades through the muscularis propria, with 2/18 lymph nodes involved, with the pathological staging T3 N1b disease. Her tumor was found to be microsatellite stable. KRAS mutation is pending. She is also status post Port-A-Cath insertion that was done on 01/05/2013. FOLFOX chemotherapy started 01/18/2013. Avastin to be added with cycle 2 on 02/01/2013. She is S/P 12 cycles completed in 05/2013.   Current therapy: Observation and surveillance.   Interim History:  Ms. Wiater presents today for a followup visit. As her last visit, she has been doing well. She is complaining more of a right-sided lower back pain that at times radiates down her leg. She has disc disease that has been chronic in nature. 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 She is not reporting any abdominal pain. She does not report any diarrhea. Does not report any hematochezia. Does not report any melena. Does not report any nausea. Does not report any vomiting. Does not report any pelvic pain. Does not report any bony pain. Her performance status and activity level remains excellent. She still works full-time. Her appetite have been also excellent and she has gained weight. No recent hospitalizations or illnesses. Her ambulatory blood pressure monitoring have been normal.  Rest of her review of systems unremarkable.   Medications: I have reviewed the patient's current medications.  Current Outpatient Prescriptions  Medication Sig Dispense Refill  . acetaminophen (TYLENOL) 500 MG tablet Take 500 mg by mouth every 6 (six) hours as needed for pain.      . diphenhydrAMINE (BENADRYL) 25 mg capsule Take 25-50 mg by mouth daily as needed for itching. For allergic reaction      . lidocaine-prilocaine (EMLA) cream Apply 1 application topically as needed. Apply approx 1/2 tsp to skin over port, prior to chemotherapy treatments  1 kit  3  . Multiple Vitamin (MULTIVITAMIN) tablet Take 1 tablet by mouth daily.      . ondansetron (ZOFRAN) 8 MG tablet Take 1 tablet (8 mg total) by mouth every 8 (eight) hours as needed for nausea.  30 tablet  1   No current facility-administered medications for this visit.     Allergies: No Known Allergies  Past Medical History, Surgical history, Social history, and Family History were reviewed and updated.    Physical Exam:  Blood pressure 119/75, pulse 98, temperature 97.7 F (36.5 C), temperature source Oral, resp. rate 18, height _0  (1.626 m), weight 152 lb (68.947 kg). ECOG: 0 General appearance: alert awake  Head: Normocephalic, without obvious abnormality, atraumatic Neck: no adenopathy.  Lymph nodes: Cervical, supraclavicular, and axillary nodes normal. Heart:regular rate and rhythm, S1, S2 normal, no murmur, click, rub or gallop Lung:chest clear, no wheezing, rales, normal symmetric air entry Abdomen: soft, non-tender, without masses or organomegaly EXT:no erythema, induration, or nodules Neurological: No deficits noted. Gait and mood are stable.   CBC  Component Value Date/Time   WBC 3.8* 01/03/2014 0810   WBC 4.3 01/04/2013 0837   WBC 8.6 11/10/2011 1329   RBC 3.94 01/03/2014 0810   RBC 3.50* 01/04/2013 0837   RBC 3.95* 11/10/2011 1329   HGB 12.1 01/03/2014 0810   HGB 9.4* 01/04/2013 0837   HGB 11.3* 11/10/2011  1329   HCT 36.5 01/03/2014 0810   HCT 28.5* 01/04/2013 0837   HCT 35.8* 11/10/2011 1329   PLT 244 01/03/2014 0810   PLT 382 01/04/2013 0837   MCV 92.6 01/03/2014 0810   MCV 81.4 01/04/2013 0837   MCV 90.7 11/10/2011 1329   MCH 30.7 01/03/2014 0810   MCH 26.9 01/04/2013 0837   MCH 28.6 11/10/2011 1329   MCHC 33.2 01/03/2014 0810   MCHC 33.0 01/04/2013 0837   MCHC 31.6* 11/10/2011 1329   RDW 13.6 01/03/2014 0810   RDW 15.8* 01/04/2013 0837   LYMPHSABS 1.4 01/03/2014 0810   LYMPHSABS 1.2 12/09/2012 1045   MONOABS 0.3 01/03/2014 0810   MONOABS 0.9 12/09/2012 1045   EOSABS 0.2 01/03/2014 0810   EOSABS 0.0 12/09/2012 1045   BASOSABS 0.0 01/03/2014 0810   BASOSABS 0.0 12/09/2012 1045      Impression and Plan:  63 year old woman with the following issues:  1. Advanced Colon cancer. She presented with a large right cecal mass causing obstruction and abscess and appendicitis. She is status post 12 cycles of chemotherapy with  PET/CT scan on 09/12/2013  showed no residual disease actually in the liver that is hypermetabolic. Options of therapy were discussed today and liver directed therapy is really not indicated at this point given the fact that she has no residual disease. The plan is to continue observation and surveillance at this time. I will repeat a PET/CT scan in 3 months and a visit at that time.  2. Intravenous access. PAC in place without complications. We will flush every 6 weeks.  3. Right-sided back pain related to disc disease: She requested a referral to neurosurgery for an evaluation regarding her disc disease.  4. Neuropathy: This is improving slowly after the completion of systemic chemotherapy.  5. Hypertension: This is most likely related to Avastin. Her blood pressure have normalized now I asked her to stop Norvasc.  6. Followup: Will be in 3 months for a clinical visit after that and total PET scan.   [facility] MD 01/03/2014

## 2014-01-08 ENCOUNTER — Telehealth: Payer: Self-pay | Admitting: Oncology

## 2014-01-08 NOTE — Telephone Encounter (Signed)
Faxed pt medical records to [facility]

## 2014-01-10 ENCOUNTER — Encounter: Payer: Self-pay | Admitting: *Deleted

## 2014-01-17 ENCOUNTER — Telehealth: Payer: Self-pay | Admitting: Medical Oncology

## 2014-01-17 NOTE — Telephone Encounter (Signed)
VMOM: Chris with [facility] called regarding referral for patient stating Dr. Hal Neer recommending MRI for patient based on records provided and feels that MRI will determine if a consult is needed at this time.  Asking whether this is the route Dr. Alen Blew wishes to go.  MD informed.

## 2014-02-14 ENCOUNTER — Ambulatory Visit (HOSPITAL_BASED_OUTPATIENT_CLINIC_OR_DEPARTMENT_OTHER): Payer: BC Managed Care – PPO

## 2014-02-14 VITALS — BP 126/74 | HR 80 | Temp 98.6°F | Resp 18

## 2014-02-14 DIAGNOSIS — C180 Malignant neoplasm of cecum: Secondary | ICD-10-CM

## 2014-02-14 DIAGNOSIS — Z452 Encounter for adjustment and management of vascular access device: Secondary | ICD-10-CM

## 2014-02-14 DIAGNOSIS — Z95828 Presence of other vascular implants and grafts: Secondary | ICD-10-CM

## 2014-02-14 MED ORDER — SODIUM CHLORIDE 0.9 % IJ SOLN
10.0000 mL | INTRAMUSCULAR | Status: DC | PRN
  Administered 2014-02-14: 10 mL via INTRAVENOUS
  Filled 2014-02-14: qty 10

## 2014-02-14 MED ORDER — HEPARIN SOD (PORK) LOCK FLUSH 100 UNIT/ML IV SOLN
500.0000 [IU] | Freq: Once | INTRAVENOUS | Status: AC
  Administered 2014-02-14: 500 [IU] via INTRAVENOUS
  Filled 2014-02-14: qty 5

## 2014-02-14 NOTE — Patient Instructions (Signed)

## 2014-02-23 ENCOUNTER — Other Ambulatory Visit: Payer: Self-pay | Admitting: Oncology

## 2014-02-23 ENCOUNTER — Telehealth: Payer: Self-pay | Admitting: Medical Oncology

## 2014-02-23 DIAGNOSIS — C189 Malignant neoplasm of colon, unspecified: Secondary | ICD-10-CM

## 2014-02-23 DIAGNOSIS — G629 Polyneuropathy, unspecified: Secondary | ICD-10-CM

## 2014-02-23 NOTE — Telephone Encounter (Signed)
Referral done

## 2014-02-23 NOTE — Telephone Encounter (Signed)
VMOM: Patient called requesting referral to [facility] for her disc disease. States [facility] has "put off my appt several times, I need to be seen by someone."  F/u 10/15 MD  LOV 07/15  Mssg routed to MD.

## 2014-02-23 NOTE — Telephone Encounter (Signed)
LVMOM with patient informing her that Dr. Alen Blew placed referral to [facility]. Patient to call office with further questions/concerns.

## 2014-03-30 ENCOUNTER — Telehealth: Payer: Self-pay | Admitting: Neurology

## 2014-03-30 NOTE — Telephone Encounter (Signed)
Pt canceled appt on 04-06-14 and referring dr was notified

## 2014-04-03 ENCOUNTER — Other Ambulatory Visit (HOSPITAL_BASED_OUTPATIENT_CLINIC_OR_DEPARTMENT_OTHER): Payer: BC Managed Care – PPO

## 2014-04-03 ENCOUNTER — Other Ambulatory Visit: Payer: Self-pay | Admitting: Oncology

## 2014-04-03 ENCOUNTER — Ambulatory Visit (HOSPITAL_BASED_OUTPATIENT_CLINIC_OR_DEPARTMENT_OTHER): Payer: BC Managed Care – PPO

## 2014-04-03 ENCOUNTER — Ambulatory Visit (HOSPITAL_COMMUNITY)
Admission: RE | Admit: 2014-04-03 | Discharge: 2014-04-03 | Disposition: A | Discharge status: Home / Self Care | Payer: BC Managed Care – PPO | Source: Ambulatory Visit | Attending: Oncology | Admitting: Oncology

## 2014-04-03 VITALS — BP 122/75 | HR 74 | Temp 98.0°F

## 2014-04-03 DIAGNOSIS — C189 Malignant neoplasm of colon, unspecified: Secondary | ICD-10-CM | POA: Insufficient documentation

## 2014-04-03 DIAGNOSIS — M545 Low back pain, unspecified: Secondary | ICD-10-CM

## 2014-04-03 DIAGNOSIS — C180 Malignant neoplasm of cecum: Secondary | ICD-10-CM

## 2014-04-03 DIAGNOSIS — Z452 Encounter for adjustment and management of vascular access device: Secondary | ICD-10-CM

## 2014-04-03 DIAGNOSIS — Z95828 Presence of other vascular implants and grafts: Secondary | ICD-10-CM

## 2014-04-03 LAB — COMPREHENSIVE METABOLIC PANEL (CC13)
ALK PHOS: 119 U/L (ref 40–150)
ALT: 12 U/L (ref 0–55)
ANION GAP: 8 meq/L (ref 3–11)
AST: 14 U/L (ref 5–34)
Albumin: 3.3 g/dL — ABNORMAL LOW (ref 3.5–5.0)
BILIRUBIN TOTAL: 0.41 mg/dL (ref 0.20–1.20)
BUN: 18.6 mg/dL (ref 7.0–26.0)
CO2: 24 mEq/L (ref 22–29)
CREATININE: 1.1 mg/dL (ref 0.6–1.1)
Calcium: 9.4 mg/dL (ref 8.4–10.4)
Chloride: 110 mEq/L — ABNORMAL HIGH (ref 98–109)
GLUCOSE: 95 mg/dL (ref 70–140)
Potassium: 4 mEq/L (ref 3.5–5.1)
Sodium: 142 mEq/L (ref 136–145)
TOTAL PROTEIN: 7.3 g/dL (ref 6.4–8.3)

## 2014-04-03 LAB — CBC WITH DIFFERENTIAL/PLATELET
BASO%: 0.6 % (ref 0.0–2.0)
Basophils Absolute: 0 10*3/uL (ref 0.0–0.1)
EOS ABS: 0.1 10*3/uL (ref 0.0–0.5)
EOS%: 2.8 % (ref 0.0–7.0)
HCT: 36.6 % (ref 34.8–46.6)
HGB: 12.1 g/dL (ref 11.6–15.9)
LYMPH%: 42.2 % (ref 14.0–49.7)
MCH: 30.6 pg (ref 25.1–34.0)
MCHC: 33.1 g/dL (ref 31.5–36.0)
MCV: 92.7 fL (ref 79.5–101.0)
MONO#: 0.3 10*3/uL (ref 0.1–0.9)
MONO%: 8.2 % (ref 0.0–14.0)
NEUT#: 1.6 10*3/uL (ref 1.5–6.5)
NEUT%: 46.2 % (ref 38.4–76.8)
PLATELETS: 231 10*3/uL (ref 145–400)
RBC: 3.95 10*6/uL (ref 3.70–5.45)
RDW: 12.9 % (ref 11.2–14.5)
WBC: 3.5 10*3/uL — ABNORMAL LOW (ref 3.9–10.3)
lymph#: 1.5 10*3/uL (ref 0.9–3.3)

## 2014-04-03 LAB — GLUCOSE, CAPILLARY: Glucose-Capillary: 87 mg/dL (ref 70–99)

## 2014-04-03 LAB — CEA: CEA: 0.6 ng/mL (ref 0.0–5.0)

## 2014-04-03 MED ORDER — SODIUM CHLORIDE 0.9 % IJ SOLN
10.0000 mL | INTRAMUSCULAR | Status: DC | PRN
  Administered 2014-04-03: 10 mL via INTRAVENOUS
  Filled 2014-04-03: qty 10

## 2014-04-03 MED ORDER — FLUDEOXYGLUCOSE F - 18 (FDG) INJECTION
7.9400 | Freq: Once | INTRAVENOUS | Status: AC | PRN
  Administered 2014-04-03: 7.94 via INTRAVENOUS

## 2014-04-03 MED ORDER — HEPARIN SOD (PORK) LOCK FLUSH 100 UNIT/ML IV SOLN
500.0000 [IU] | Freq: Once | INTRAVENOUS | Status: AC
  Administered 2014-04-03: 500 [IU] via INTRAVENOUS
  Filled 2014-04-03: qty 5

## 2014-04-03 NOTE — Patient Instructions (Signed)

## 2014-04-05 ENCOUNTER — Encounter: Payer: Self-pay | Admitting: Oncology

## 2014-04-05 ENCOUNTER — Telehealth: Payer: Self-pay | Admitting: Oncology

## 2014-04-05 ENCOUNTER — Ambulatory Visit (HOSPITAL_BASED_OUTPATIENT_CLINIC_OR_DEPARTMENT_OTHER): Payer: BC Managed Care – PPO | Admitting: Oncology

## 2014-04-05 VITALS — BP 146/89 | HR 78 | Temp 98.0°F | Resp 18 | Ht 64.0 in | Wt 171.0 lb

## 2014-04-05 DIAGNOSIS — I10 Essential (primary) hypertension: Secondary | ICD-10-CM

## 2014-04-05 DIAGNOSIS — G609 Hereditary and idiopathic neuropathy, unspecified: Secondary | ICD-10-CM

## 2014-04-05 DIAGNOSIS — C180 Malignant neoplasm of cecum: Secondary | ICD-10-CM

## 2014-04-05 DIAGNOSIS — C189 Malignant neoplasm of colon, unspecified: Secondary | ICD-10-CM

## 2014-04-05 NOTE — Progress Notes (Signed)
Hematology and Oncology Follow Up Visit  Margaret Elliott [id number] 03-28-1951     Principle Diagnosis: This is a 63 year old woman diagnosed with colon cancer in June 2014. She had presented with an abdominal pain and found to have a cecal mass resulting in obstruction and advanced appendicitis.  She has at least stage III disease. Likely has stage IV disease with a liver mass.    Prior Therapy: She is status post laparoscopic laparotomy, evacuation of a pelvic abscess and right hemicolectomy with ileocolonic anastomosis done on December 09, 2012. The pathology showed an invasive, well- differentiated colorectal adenocarcinoma. Tumor invades through the muscularis propria, with 2/18 lymph nodes involved, with the pathological staging T3 N1b disease. Her tumor was found to be microsatellite stable. KRAS mutation is pending. She is also status post Port-A-Cath insertion that was done on 01/05/2013. FOLFOX chemotherapy started 01/18/2013. Avastin to be added with cycle 2 on 02/01/2013. She is S/P 12 cycles completed in 05/2013.   Current therapy: Observation and surveillance.   Interim History:  Margaret Elliott presents today for a followup visit. Since her last visit, she has been doing well. She is complaining of a right-sided lower back pain that at times radiates down her leg which has improved at this time. 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 He is able to ambulate, dry and work full time without any decline. She is not reporting any abdominal pain. She does not report any diarrhea. Does not report any hematochezia. Does not report any melena. Does not report any nausea. Does not report any vomiting. Does not report any pelvic pain. Does not report any bony pain. Her performance status and activity level remains excellent. She still works full-time. Her appetite have been also excellent and she has gained weight. No recent hospitalizations or illnesses. Rest of her review of  systems unremarkable.   Medications: I have reviewed the patient's current medications.  Current Outpatient Prescriptions  Medication Sig Dispense Refill  . acetaminophen (TYLENOL) 500 MG tablet Take 500 mg by mouth every 6 (six) hours as needed for pain.      . diphenhydrAMINE (BENADRYL) 25 mg capsule Take 25-50 mg by mouth daily as needed for itching. For allergic reaction      . lidocaine-prilocaine (EMLA) cream Apply 1 application topically as needed. Apply approx 1/2 tsp to skin over port, prior to chemotherapy treatments  1 kit  3  . Multiple Vitamin (MULTIVITAMIN) tablet Take 1 tablet by mouth daily.      . ondansetron (ZOFRAN) 8 MG tablet Take 1 tablet (8 mg total) by mouth every 8 (eight) hours as needed for nausea.  30 tablet  1   No current facility-administered medications for this visit.     Allergies: No Known Allergies  Past Medical History, Surgical history, Social history, and Family History were reviewed and updated.    Physical Exam:  Blood pressure 119/75, pulse 98, temperature 97.7 F (36.5 C), temperature source Oral, resp. rate 18, height $RemoveBe'5\' 4"'sNevdQiqf$  (1.626 m), weight 152 lb (68.947 kg). ECOG: 0 General appearance: alert awake  Head: Normocephalic, without obvious abnormality. Neck: no adenopathy.  Lymph nodes: Cervical, supraclavicular, and axillary nodes normal. Heart:regular rate and rhythm, S1, S2 normal, no murmur, click, rub or gallop Lung:chest clear, no wheezing, rales, normal symmetric air entry Abdomen: soft, non-tender, without masses or organomegaly EXT:no erythema, induration, or nodules Neurological: No deficits noted. Gait and mood are stable.   CBC    Component Value  Date/Time   WBC 3.5* 04/03/2014 0812   WBC 4.3 01/04/2013 0837   WBC 8.6 11/10/2011 1329   RBC 3.95 04/03/2014 0812   RBC 3.50* 01/04/2013 0837   RBC 3.95* 11/10/2011 1329   HGB 12.1 04/03/2014 0812   HGB 9.4* 01/04/2013 0837   HGB 11.3* 11/10/2011 1329   HCT 36.6 04/03/2014  0812   HCT 28.5* 01/04/2013 0837   HCT 35.8* 11/10/2011 1329   PLT 231 04/03/2014 0812   PLT 382 01/04/2013 0837   MCV 92.7 04/03/2014 0812   MCV 81.4 01/04/2013 0837   MCV 90.7 11/10/2011 1329   MCH 30.6 04/03/2014 0812   MCH 26.9 01/04/2013 0837   MCH 28.6 11/10/2011 1329   MCHC 33.1 04/03/2014 0812   MCHC 33.0 01/04/2013 0837   MCHC 31.6* 11/10/2011 1329   RDW 12.9 04/03/2014 0812   RDW 15.8* 01/04/2013 0837   LYMPHSABS 1.5 04/03/2014 0812   LYMPHSABS 1.2 12/09/2012 1045   MONOABS 0.3 04/03/2014 0812   MONOABS 0.9 12/09/2012 1045   EOSABS 0.1 04/03/2014 0812   EOSABS 0.0 12/09/2012 1045   BASOSABS 0.0 04/03/2014 0812   BASOSABS 0.0 12/09/2012 1045    EXAM:  NUCLEAR MEDICINE PET SKULL BASE TO THIGH  TECHNIQUE:  7.9 mCi F-18 FDG was injected intravenously. Full-ring PET imaging  was performed from the skull base to thigh after the radiotracer. CT  data was obtained and used for attenuation correction and anatomic  localization.  FASTING BLOOD GLUCOSE: Value: 87 mg/dl  COMPARISON: 09/12/2013  FINDINGS:  NECK  No hypermetabolic lymph nodes in the neck. Hypermetabolism again  noted within brown fat.  CHEST  No hypermetabolic mediastinal or hilar nodes. Hypermetabolism again  noted within brown fat.  4 mm pulmonary nodule in the anterior left upper lobe on image 27 of  series 8 remains stable on CT in shows no associated metabolic  activity.  ABDOMEN/PELVIS  A solitary focus of hypermetabolic activity is seen in the posterior  right hepatic lobe with SUV max of 4.5, which is new since prior  exam. No other hypermetabolic lesions are seen within the liver or  other abdominal parenchymal organs. No hypermetabolic  lymphadenopathy identified within the abdomen or pelvis. Previously  demonstrated small right adnexal lesion is no longer visualized by  CT.  SKELETON  No focal hypermetabolic activity to suggest skeletal metastasis.  IMPRESSION:  Recurrent focus of hypermetabolic  activity in the posterior right  hepatic lobe, suspicious for liver metastasis. Consider abdomen MRI  without and with contrast for further evaluation.  No other sites of metastatic carcinoma identified.    Impression and Plan:  63 year old woman with the following issues:  1. Advanced Colon cancer. She presented with a large right cecal mass causing obstruction and abscess and appendicitis. She is status post 12 cycles of chemotherapy with PET/CT scan on 09/12/2013  showed no residual disease actually in the liver that is hypermetabolic.  Most recent PET/CT scan on 04/03/2014 was reviewed today with the patient. It appears that she had a recurrent activity in the posterior right hepatic lobe. She has been evaluated in the past by interventional radiology for possible liver directed therapy. I will obtain an MRI of that area and refer her back for possible reevaluation. She could also be a candidate for surgical resection as well. She has no other disease outside of the liver.  2. Intravenous access. PAC in place without complications. We will flush every 6 weeks.  3. Right-sided back pain related to  disc disease: She is doing better at this time.  4. Neuropathy: This is improving slowly after the completion of systemic chemotherapy.  5. Hypertension: This is most likely related to Avastin. Her blood pressure have normalized.   6. Followup: Will be in 6 weeks to follow up on her progress.   [facility] MD 04/05/2014

## 2014-04-05 NOTE — Telephone Encounter (Signed)
gv and printed appt sched and avs for pt for for DEC

## 2014-04-06 ENCOUNTER — Ambulatory Visit: Payer: BC Managed Care – PPO | Admitting: Neurology

## 2014-04-13 ENCOUNTER — Ambulatory Visit (HOSPITAL_COMMUNITY)
Admission: RE | Admit: 2014-04-13 | Discharge: 2014-04-13 | Disposition: A | Discharge status: Home / Self Care | Payer: BC Managed Care – PPO | Source: Ambulatory Visit | Attending: Oncology | Admitting: Oncology

## 2014-04-13 ENCOUNTER — Other Ambulatory Visit: Payer: Self-pay | Admitting: Oncology

## 2014-04-13 DIAGNOSIS — K769 Liver disease, unspecified: Secondary | ICD-10-CM | POA: Insufficient documentation

## 2014-04-13 DIAGNOSIS — C189 Malignant neoplasm of colon, unspecified: Secondary | ICD-10-CM

## 2014-04-13 DIAGNOSIS — Z85038 Personal history of other malignant neoplasm of large intestine: Secondary | ICD-10-CM | POA: Diagnosis present

## 2014-04-13 MED ORDER — GADOBENATE DIMEGLUMINE 529 MG/ML IV SOLN
20.0000 mL | Freq: Once | INTRAVENOUS | Status: AC | PRN
  Administered 2014-04-13: 16 mL via INTRAVENOUS

## 2014-04-16 ENCOUNTER — Other Ambulatory Visit: Payer: Self-pay | Admitting: Oncology

## 2014-04-16 DIAGNOSIS — C189 Malignant neoplasm of colon, unspecified: Secondary | ICD-10-CM

## 2014-05-22 ENCOUNTER — Other Ambulatory Visit (HOSPITAL_BASED_OUTPATIENT_CLINIC_OR_DEPARTMENT_OTHER): Payer: BC Managed Care – PPO

## 2014-05-22 ENCOUNTER — Telehealth: Payer: Self-pay | Admitting: Oncology

## 2014-05-22 ENCOUNTER — Ambulatory Visit (HOSPITAL_BASED_OUTPATIENT_CLINIC_OR_DEPARTMENT_OTHER): Payer: BC Managed Care – PPO

## 2014-05-22 ENCOUNTER — Ambulatory Visit (HOSPITAL_BASED_OUTPATIENT_CLINIC_OR_DEPARTMENT_OTHER): Payer: BC Managed Care – PPO | Admitting: Oncology

## 2014-05-22 VITALS — BP 155/90 | HR 81 | Temp 98.1°F | Resp 18 | Wt 172.4 lb

## 2014-05-22 DIAGNOSIS — Z95828 Presence of other vascular implants and grafts: Secondary | ICD-10-CM

## 2014-05-22 DIAGNOSIS — G629 Polyneuropathy, unspecified: Secondary | ICD-10-CM

## 2014-05-22 DIAGNOSIS — C189 Malignant neoplasm of colon, unspecified: Secondary | ICD-10-CM

## 2014-05-22 DIAGNOSIS — C180 Malignant neoplasm of cecum: Secondary | ICD-10-CM

## 2014-05-22 DIAGNOSIS — I10 Essential (primary) hypertension: Secondary | ICD-10-CM

## 2014-05-22 LAB — COMPREHENSIVE METABOLIC PANEL (CC13)
ALT: 12 U/L (ref 0–55)
AST: 17 U/L (ref 5–34)
Albumin: 3.4 g/dL — ABNORMAL LOW (ref 3.5–5.0)
Alkaline Phosphatase: 91 U/L (ref 40–150)
Anion Gap: 8 mEq/L (ref 3–11)
BUN: 15 mg/dL (ref 7.0–26.0)
CALCIUM: 9.2 mg/dL (ref 8.4–10.4)
CHLORIDE: 108 meq/L (ref 98–109)
CO2: 25 mEq/L (ref 22–29)
CREATININE: 0.9 mg/dL (ref 0.6–1.1)
Glucose: 89 mg/dl (ref 70–140)
POTASSIUM: 4 meq/L (ref 3.5–5.1)
Sodium: 140 mEq/L (ref 136–145)
Total Bilirubin: 0.46 mg/dL (ref 0.20–1.20)
Total Protein: 7 g/dL (ref 6.4–8.3)

## 2014-05-22 LAB — CBC WITH DIFFERENTIAL/PLATELET
BASO%: 0.7 % (ref 0.0–2.0)
Basophils Absolute: 0 10*3/uL (ref 0.0–0.1)
EOS%: 6.2 % (ref 0.0–7.0)
Eosinophils Absolute: 0.3 10*3/uL (ref 0.0–0.5)
HCT: 37.6 % (ref 34.8–46.6)
HGB: 12.2 g/dL (ref 11.6–15.9)
LYMPH#: 1.8 10*3/uL (ref 0.9–3.3)
LYMPH%: 41.2 % (ref 14.0–49.7)
MCH: 30.3 pg (ref 25.1–34.0)
MCHC: 32.3 g/dL (ref 31.5–36.0)
MCV: 93.8 fL (ref 79.5–101.0)
MONO#: 0.3 10*3/uL (ref 0.1–0.9)
MONO%: 7.7 % (ref 0.0–14.0)
NEUT#: 1.9 10*3/uL (ref 1.5–6.5)
NEUT%: 44.2 % (ref 38.4–76.8)
Platelets: 254 10*3/uL (ref 145–400)
RBC: 4.01 10*6/uL (ref 3.70–5.45)
RDW: 13.7 % (ref 11.2–14.5)
WBC: 4.3 10*3/uL (ref 3.9–10.3)

## 2014-05-22 MED ORDER — SODIUM CHLORIDE 0.9 % IJ SOLN
10.0000 mL | INTRAMUSCULAR | Status: DC | PRN
  Administered 2014-05-22: 10 mL via INTRAVENOUS
  Filled 2014-05-22: qty 10

## 2014-05-22 MED ORDER — HEPARIN SOD (PORK) LOCK FLUSH 100 UNIT/ML IV SOLN
500.0000 [IU] | Freq: Once | INTRAVENOUS | Status: AC
  Administered 2014-05-22: 500 [IU] via INTRAVENOUS
  Filled 2014-05-22: qty 5

## 2014-05-22 NOTE — Addendum Note (Signed)
Addended by: Randolm Idol on: 05/22/2014 08:55 AM   Modules accepted: Medications

## 2014-05-22 NOTE — Patient Instructions (Signed)

## 2014-05-22 NOTE — Telephone Encounter (Signed)
gv and printeda appt sched for Jan 2016...Marland Kitchenper [facility] pt was known to Dr. Rosendo Gros they will get his approval to switch and call pt

## 2014-05-22 NOTE — Progress Notes (Signed)
Hematology and Oncology Follow Up Visit  Margaret Elliott [id number] [birth year]     Principle Diagnosis: This is a 63 year old woman diagnosed with colon cancer in June 2014. She had presented with an abdominal pain and found to have a cecal mass resulting in obstruction and advanced appendicitis.  She has at least stage III disease. Likely has stage IV disease with a liver mass.    Prior Therapy: She is status post laparoscopic laparotomy, evacuation of a pelvic abscess and right hemicolectomy with ileocolonic anastomosis done on December 09, 2012. The pathology showed an invasive, well- differentiated colorectal adenocarcinoma. Tumor invades through the muscularis propria, with 2/18 lymph nodes involved, with the pathological staging T3 N1b disease. Her tumor was found to be microsatellite stable. KRAS mutation is pending. She is also status post Port-A-Cath insertion that was done on 01/05/2013. FOLFOX chemotherapy started 01/18/2013. Avastin to be added with cycle 2 on 02/01/2013. She is S/P 12 cycles completed in 05/2013.   Current therapy: Observation and surveillance.   Interim History:  Margaret Elliott presents today for a followup visit. Since her last visit, she reports no complaints. She did have right-sided lower back pain that at times radiates down her leg which has resolved since the last visit. 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 He is able to ambulate, dry and work full time without any decline. She is not reporting any abdominal pain. She does not report any diarrhea. Does not report any hematochezia. Does not report any melena. Does not report any nausea. Does not report any vomiting. Does not report any pelvic pain. Does not report any bony pain. Her performance status and activity level remains excellent. She still works full-time. Her appetite have been also excellent and she has gained weight. No recent hospitalizations or illnesses. Rest of her review of  systems unremarkable.   Medications: I have reviewed the patient's current medications.  Current Outpatient Prescriptions  Medication Sig Dispense Refill  . acetaminophen (TYLENOL) 500 MG tablet Take 500 mg by mouth every 6 (six) hours as needed for pain.      . diphenhydrAMINE (BENADRYL) 25 mg capsule Take 25-50 mg by mouth daily as needed for itching. For allergic reaction      . lidocaine-prilocaine (EMLA) cream Apply 1 application topically as needed. Apply approx 1/2 tsp to skin over port, prior to chemotherapy treatments  1 kit  3  . Multiple Vitamin (MULTIVITAMIN) tablet Take 1 tablet by mouth daily.      . ondansetron (ZOFRAN) 8 MG tablet Take 1 tablet (8 mg total) by mouth every 8 (eight) hours as needed for nausea.  30 tablet  1   No current facility-administered medications for this visit.     Allergies: No Known Allergies  Past Medical History, Surgical history, Social history, and Family History were reviewed and updated.    Physical Exam:  Blood pressure 119/75, pulse 98, temperature 97.7 F (36.5 C), temperature source Oral, resp. rate 18, height 5' 4" (1.626 m), weight 152 lb (68.947 kg). ECOG: 0 General appearance: alert awake  Head: Normocephalic, without obvious abnormality. Neck: no adenopathy.  Lymph nodes: Cervical, supraclavicular, and axillary nodes normal. Heart:regular rate and rhythm, S1, S2 normal, no murmur, click, rub or gallop Lung:chest clear, no wheezing, rales, normal symmetric air entry Abdomen: soft, non-tender, without masses or organomegaly EXT:no erythema, induration, or nodules Neurological: No deficits noted. Gait and mood are stable.   CBC    Component Value Date/Time  WBC 4.3 05/22/2014 0810   WBC 4.3 01/04/2013 0837   WBC 8.6 11/10/2011 1329   RBC 4.01 05/22/2014 0810   RBC 3.50* 01/04/2013 0837   RBC 3.95* 11/10/2011 1329   HGB 12.2 05/22/2014 0810   HGB 9.4* 01/04/2013 0837   HGB 11.3* 11/10/2011 1329   HCT 37.6  05/22/2014 0810   HCT 28.5* 01/04/2013 0837   HCT 35.8* 11/10/2011 1329   PLT 254 05/22/2014 0810   PLT 382 01/04/2013 0837   MCV 93.8 05/22/2014 0810   MCV 81.4 01/04/2013 0837   MCV 90.7 11/10/2011 1329   MCH 30.3 05/22/2014 0810   MCH 26.9 01/04/2013 0837   MCH 28.6 11/10/2011 1329   MCHC 32.3 05/22/2014 0810   MCHC 33.0 01/04/2013 0837   MCHC 31.6* 11/10/2011 1329   RDW 13.7 05/22/2014 0810   RDW 15.8* 01/04/2013 0837   LYMPHSABS 1.8 05/22/2014 0810   LYMPHSABS 1.2 12/09/2012 1045   MONOABS 0.3 05/22/2014 0810   MONOABS 0.9 12/09/2012 1045   EOSABS 0.3 05/22/2014 0810   EOSABS 0.0 12/09/2012 1045   BASOSABS 0.0 05/22/2014 0810   BASOSABS 0.0 12/09/2012 1045    CLINICAL DATA: 63 year old female with history of colon cancer. PET-CT from 04/03/2014 demonstrated abnormal hypermetabolic activity in the posterior aspect of the right hepatic lobe suspicious for metastatic disease. Evaluate for possible hepatic metastasis.  EXAM: MRI ABDOMEN WITHOUT AND WITH CONTRAST  TECHNIQUE: Multiplanar multisequence MR imaging of the abdomen was performed both before and after the administration of intravenous contrast.  CONTRAST: 16 mL of MultiHance.  COMPARISON: PET-CT 04/03/2014.  FINDINGS: The area of concern in the medial aspect of the right lobe of the liver between segments 6 and 7 is slightly T1 hypointense, slightly T2 hyperintense, demonstrates mild diffusion restriction, and is difficult to visualize on post gadolinium images demonstrating very subtle heterogeneous enhancement on early arterial phase progressively appearing more hypovascular on more delayed phase imaging. This lesion measures approximately 12 mm (image 21 of series 5). No other hepatic lesions are noted. No intra or extrahepatic biliary ductal dilatation.  Increased T1 signal intensity is noted dependently within the gallbladder, presumably some biliary sludge. Gallbladder is otherwise  unremarkable in appearance. The appearance of the pancreas, spleen, bilateral adrenal glands and the left kidney is unremarkable. 2 cm simple cyst in the posterior aspect of the interpolar region of the right kidney.  IMPRESSION: 1. The lesion of concern in the right lobe of the liver is between segments 6 and 7, currently measures 12 mm, and has imaging characteristics compatible with a metastatic lesion (likely partially treated when comparison a prior examinations). 2. No new metastatic lesions are otherwise noted. Impression and Plan:  63 year old woman with the following issues:  1. Advanced Colon cancer. She presented with a large right cecal mass causing obstruction and abscess and appendicitis. She is status post 12 cycles of chemotherapy with PET/CT scan on 09/12/2013  showed no residual disease actually in the liver that is hypermetabolic.  Most recent PET/CT scan on 04/03/2014 showed recurrent activity in the posterior right hepatic lobe. MRI on 04/13/2014 showed that there is a concern of the right lobe of the liver, a possible mass measuring 12 mm. It is unclear whether this is a partially treated tumor versus recurrence. Given the fact that she does not have any systemic disease and she would be next one candidate for any liver directed therapy. She has been evaluated by interventional radiology in the past and I will also ask  Dr. Barry Dienes to evaluate her for possible hepatic resection. For the time being, I see no reason to restart systemic chemotherapy.  2. Intravenous access. PAC in place without complications. We will flush every 6 weeks.  3. Right-sided back pain related to disc disease: She is doing better at this time.  4. Neuropathy: This is improving slowly after the completion of systemic chemotherapy.  5. Hypertension: Blood pressure is normal at this time.  6. Followup: Will be in 6 weeks to follow up on her progress.   [facility] MD 05/22/2014

## 2014-07-13 ENCOUNTER — Ambulatory Visit: Payer: BC Managed Care – PPO | Admitting: Oncology

## 2014-07-13 ENCOUNTER — Other Ambulatory Visit: Payer: BC Managed Care – PPO

## 2014-07-13 ENCOUNTER — Telehealth: Payer: Self-pay | Admitting: Oncology

## 2014-07-13 NOTE — Telephone Encounter (Signed)
Pt called to r/s labs/flush/ov due to weather, confirmed updated sch..... KJ

## 2014-07-17 ENCOUNTER — Ambulatory Visit (HOSPITAL_BASED_OUTPATIENT_CLINIC_OR_DEPARTMENT_OTHER): Payer: BLUE CROSS/BLUE SHIELD

## 2014-07-17 VITALS — BP 124/82 | HR 86 | Temp 98.4°F

## 2014-07-17 DIAGNOSIS — C180 Malignant neoplasm of cecum: Secondary | ICD-10-CM

## 2014-07-17 DIAGNOSIS — Z452 Encounter for adjustment and management of vascular access device: Secondary | ICD-10-CM

## 2014-07-17 DIAGNOSIS — Z95828 Presence of other vascular implants and grafts: Secondary | ICD-10-CM

## 2014-07-17 MED ORDER — SODIUM CHLORIDE 0.9 % IJ SOLN
10.0000 mL | INTRAMUSCULAR | Status: DC | PRN
  Administered 2014-07-17: 10 mL via INTRAVENOUS
  Filled 2014-07-17: qty 10

## 2014-07-17 MED ORDER — HEPARIN SOD (PORK) LOCK FLUSH 100 UNIT/ML IV SOLN
500.0000 [IU] | Freq: Once | INTRAVENOUS | Status: AC
  Administered 2014-07-17: 500 [IU] via INTRAVENOUS
  Filled 2014-07-17: qty 5

## 2014-07-17 NOTE — Patient Instructions (Signed)

## 2014-07-30 ENCOUNTER — Other Ambulatory Visit (INDEPENDENT_AMBULATORY_CARE_PROVIDER_SITE_OTHER): Payer: Self-pay | Admitting: General Surgery

## 2014-07-30 DIAGNOSIS — C787 Secondary malignant neoplasm of liver and intrahepatic bile duct: Principal | ICD-10-CM

## 2014-07-30 DIAGNOSIS — C189 Malignant neoplasm of colon, unspecified: Secondary | ICD-10-CM

## 2014-08-09 ENCOUNTER — Ambulatory Visit
Admission: RE | Admit: 2014-08-09 | Discharge: 2014-08-09 | Disposition: A | Discharge status: Home / Self Care | Payer: BLUE CROSS/BLUE SHIELD | Source: Ambulatory Visit | Attending: General Surgery | Admitting: General Surgery

## 2014-08-09 ENCOUNTER — Other Ambulatory Visit: Payer: Self-pay | Admitting: Emergency Medicine

## 2014-08-09 DIAGNOSIS — C189 Malignant neoplasm of colon, unspecified: Secondary | ICD-10-CM

## 2014-08-09 DIAGNOSIS — C787 Secondary malignant neoplasm of liver and intrahepatic bile duct: Principal | ICD-10-CM

## 2014-08-09 HISTORY — PX: IR GENERIC HISTORICAL: IMG1180011

## 2014-08-09 NOTE — Consult Note (Signed)
Chief Complaint: Chief Complaint  Patient presents with  . 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 Byerly (hepatobiliary surgery), Shadad (oncology).  History of Present Illness: EARNESTEEN BIRNIE is a 64 y.o. female with past medical history significant only for anemia, who was initially diagnosed with colon cancer in June 2014.  At that time, she presented with abdominal pain and was found to have a cecal mass resulting in obstruction and appendicitis. At initial presentation, she was noted to have an indeterminate liver lesion which was worrisome for metastatic disease.  The patient subsequently underwent a laparoscopic right hemicolectomy with ileal-colonic anastomosis on 12/09/2012 with pathology demonstrating invasive, well-differentiated colorectal adenocarcinoma with tumor invading the muscularis propria and involvement of 2/18 sampled mesenteric lymph nodes with pathologic staging of T3, N1B disease. She was subsequently underwent portacatheter placement on 01/05/2013 with FOLFOX chemotherapy initiated on 01/18/2013. Avastin was added to cycle 2 on 11/27/2012 and she completed 12 cycles with last chemotherapy administered on 06/23/2013.    Subsequent surveillance imaging demonstrated no change to minimal decrease in the approximately 8-10 mm hypoattenuating lesion within the posterior segment of the right lobe of the liver worrisome for a solitary area of metastatic disease. At that time, the patient did not wish to undergo a surgical resection and was initially referred to interventional radiology clinic for consultation on 07/09/2013.  Follow-up PET scan was performed on 09/12/2013 failed to delineate any residual activity within the solitary liver lesion and as such, percutaneous intervention was not pursued at that time. Subsequent PET scan obtained 04/03/2014 demonstrated minimal residual activity within the solitary hepatic lesion which was confirmed on  subsequent abdominal MRI obtained 04/13/2014 and was worrisome for recurrent solitary focus of metastatic disease. Most recently obtained CEA value was 0.6 on 04/03/2014.  Patient was seen in consultation by hepatobiliary surgeon, Dr. Barry Dienes on 07/30/2014 though again does not wish to undergo a surgical procedure.  As such, she was referred back to the interventional radiology clinic for repeat consultation for potential percutaneous treatment options. The patient is unaccompanied and serves as her own historian.  The patient is currently without complaint. She maintains an excellent functional capacity. She denies abdominal pain. She denies fatigue unintentional weight loss or weight gain. No change in bowel or bladder functions. No bloody or melanotic stools.   Past Medical History  Diagnosis Date  . Anemia   . Cancer     Colon    Past Surgical History  Procedure Laterality Date  . Abdominal hysterectomy    . Ganglion cyst excision    . Partial colectomy Right 12/09/2012    Procedure:  Right Hemi-colectomy, Resection of Distal Ileum;  Surgeon: Ralene Ok, MD;  Location: [facility];  Service: General;  Laterality: Right;  . Appendectomy N/A 12/09/2012    Procedure: APPENDECTOMY;  Surgeon: Ralene Ok, MD;  Location: [facility];  Service: General;  Laterality: N/A;  . Laparoscopy N/A 12/09/2012    Procedure: LAPAROSCOPY DIAGNOSTIC;  Surgeon: Ralene Ok, MD;  Location: [facility];  Service: General;  Laterality: N/A;  . Portacath placement Left 01/04/2013    Procedure: INSERTION PORT-A-CATH;  Surgeon: Ralene Ok, MD;  Location: [facility];  Service: General;  Laterality: Left;    Allergies: Review of patient's allergies indicates no known allergies.  Medications: Prior to Admission medications   Medication Sig Start Date End Date Taking? Authorizing Provider  acetaminophen (TYLENOL) 500 MG tablet Take 500 mg by mouth every 6 (  six) hours as needed for pain.   Yes Historical Provider, MD    diphenhydrAMINE (BENADRYL) 25 mg capsule Take 25-50 mg by mouth daily as needed for itching. For allergic reaction   Yes Historical Provider, MD  lidocaine-prilocaine (EMLA) cream Apply 1 application topically as needed. Apply approx 1/2 tsp to skin over port, prior to chemotherapy treatments 01/11/13  Yes Wyatt Portela, MD  Multiple Vitamin (MULTIVITAMIN) tablet Take 1 tablet by mouth daily.   Yes Historical Provider, MD  amLODipine (NORVASC) 5 MG tablet Take 1 tablet (5 mg total) by mouth daily. Patient not taking: Reported on 08/09/2014 05/10/13   Wyatt Portela, MD  diphenoxylate-atropine (LOMOTIL) 2.5-0.025 MG per tablet Take 1 tablet by mouth 4 (four) times daily as needed for diarrhea or loose stools. Patient not taking: Reported on 08/09/2014 05/24/13   Wyatt Portela, MD  meloxicam (MOBIC) 15 MG tablet Take 15 mg by mouth daily.    Historical Provider, MD     Family History  Problem Relation Age of Onset  . Heart disease Mother   . COPD Mother     History   Social History  . Marital Status: Married    Spouse Name: N/A  . Number of Children: 3  . Years of Education: N/A   Occupational History  . Sales Other   Social History Main Topics  . Smoking status: Former Smoker    Types: Cigarettes    Quit date: 06/22/1972  . Smokeless tobacco: Never Used  . Alcohol Use: Yes     Comment: wine a couple of times a week, occ  . Drug Use: No  . Sexual Activity: Not on file   Other Topics Concern  . Not on file   Social History Narrative  . No narrative on file    ECOG Status: 0 - Asymptomatic  Review of Systems: A 12 point ROS discussed and pertinent positives are indicated in the HPI above.  All other systems are negative.  Review of Systems  Constitutional: Negative.   Respiratory: Negative.   Cardiovascular: Negative.   Gastrointestinal: Negative.  Negative for nausea, vomiting, abdominal pain, diarrhea, constipation, blood in stool and abdominal distention.   Genitourinary: Negative.   Psychiatric/Behavioral: Negative.     Vital Signs: BP 144/74 mmHg  Pulse 86  Temp(Src) 98.1 F (36.7 C) (Oral)  Resp 15  Ht 5\' 4"  (1.626 m)  Wt 161 lb (73.029 kg)  BMI 27.62 kg/m2  SpO2 100%  Physical Exam  Constitutional: She appears well-developed and well-nourished.  HENT:  Head: Normocephalic and atraumatic.  Cardiovascular: Normal heart sounds.   Pulmonary/Chest: Effort normal.  Abdominal: Soft. Bowel sounds are normal. She exhibits no mass. There is no tenderness. There is no rebound and no guarding.  Well healed vertical midline lower abdominal/pelvic incision.  Psychiatric: She has a normal mood and affect.  Nursing note and vitals reviewed.   Imaging:  PET CT - 09/12/2013;04/03/2014; abdominal MRI-04/13/2014  PET/CT performed 09/12/2013 demonstrated no definitive residual activity within the solitary hypoattenuating lesion within the posterior aspect of the right lobe of the liver.  PET/CT performed 04/03/2014 demonstrated recurrent hypermetabolic activity within a solitary ill-defined lesion within the posterior aspect of the right lobe of the liver with SUV max of 4.5 worrisome for recurrent solitary metastatic disease.  No additional sites of metastatic disease were identified.   Abdominal MRI performed 04/13/2014 demonstrated an approximately 1.2 cm lesion within the posterior aspect of the right lobe of the liver which demonstrated  imaging characteristics compatible with a current solitary metastatic lesion.  Labs:  CBC:  Recent Labs  10/04/13 0839 01/03/14 0810 04/03/14 0812 05/22/14 0810  WBC 4.2 3.8* 3.5* 4.3  HGB 11.9 12.1 12.1 12.2  HCT 36.2 36.5 36.6 37.6  PLT 242 244 231 254    COAGS: No results for input(s): INR, APTT in the last 8760 hours.  BMP:  Recent Labs  10/04/13 0840 01/03/14 0810 04/03/14 0812 05/22/14 0810  NA 140 140 142 140  K 4.1 3.7 4.0 4.0  CO2 25 23 24 25   GLUCOSE 89 116 95 89  BUN  16.2 21.9 18.6 15.0  CALCIUM 9.4 9.4 9.4 9.2  CREATININE 0.9 1.1 1.1 0.9    LIVER FUNCTION TESTS:  Recent Labs  10/04/13 0840 01/03/14 0810 04/03/14 0812 05/22/14 0810  BILITOT 0.49 0.86 0.41 0.46  AST 15 15 14 17   ALT 8 10 12 12   ALKPHOS 83 77 119 91  PROT 7.3 7.4 7.3 7.0  ALBUMIN 3.4* 3.4* 3.3* 3.4*    TUMOR MARKERS:  Recent Labs  10/04/13 0839 01/03/14 0810 04/03/14 0812  CEA 1.0 0.7 0.6    Assessment and Plan:  ROSAMUND NYLAND is a 64 y.o. female with past medical history significant only for anemia, who was initially diagnosed with colon cancer in June 2014, when she presented with abdominal pain and was found to have a cecal mass resulting in obstruction and appendicitis. At initial presentation, she was noted to have an indeterminate liver lesion which was worrisome for metastatic disease.  The patient subsequently underwent a laparoscopic right hemicolectomy with ileal-colonic anastomosis on 12/09/2012 with pathology demonstrating invasive, well-differentiated colorectal adenocarcinoma with tumor invading the muscularis propria and involvement of 2/18 sampled mesenteric lymph nodes with pathologic staging of T3, N1B disease. She completed 12 cycles with FOLFOX and Avastin with last chemotherapy administered on 06/23/2013.    Patient was initially seen at the interventional radiology clinic on 07/09/2013 however subsequent PET/CT performed 09/12/2013 failed to delineate any residual activity within the solitary liver lesion.  As such, percutaneous intervention was not pursued at that time.   Subsequent PET scan obtained 04/03/2014 demonstrated minimal residual activity within the solitary hepatic lesion which was confirmed on subsequent abdominal MRI obtained 04/13/2014. Most recently obtained CEA value was 0.6 on 04/03/2014.  Patient was seen in consultation by hepatobiliary surgeon, Dr. Barry Dienes on 07/30/2014 though again does not wish to undergo a surgical procedure.  Patient  remains asymptomatic and functional with an ECOG of 0.  Review of PET/CT performed 04/08/2014 as well as subsequent abdominal MRI performed 04/13/2014 demonstrates an approximate 1.2 cm lesion within the posterior aspect of the right lobe of the liver. Given the size (less than 3 cm) of this solitary lesion, prolonged discussions were held with the patient regarding the benefits and risks of microwave ablation, including but limited to incomplete ablation/local recurrence, bleeding, infection, radiation exposure and injury to adjacent organ.    Note, the patient plans on attending her daughter's wedding over the weekend of March 17 through the 21st.  As such, the microwave ablation will be performed at [facility] at the next available anesthesia slot following this date. The procedure will entail an overnight admission for PCA use and continued observation.  I will also obtain a preprocedural CEA level.  Thank you for this interesting consult.  I greatly enjoyed seeing LACHRISTA HESLIN and look forward to continuing to participate with her care.  SignedSandi Mariscal 08/09/2014, 9:17  AM   I spent a total of 25 Minutes in face to face in clinical consultation, greater than 50% of which was counseling/coordinating care for metastatic colon cancer.

## 2014-08-10 LAB — CEA: CEA: 1 ng/mL (ref 0.0–5.0)

## 2014-08-14 ENCOUNTER — Other Ambulatory Visit: Payer: Self-pay | Admitting: Interventional Radiology

## 2014-08-14 DIAGNOSIS — C787 Secondary malignant neoplasm of liver and intrahepatic bile duct: Principal | ICD-10-CM

## 2014-08-14 DIAGNOSIS — C189 Malignant neoplasm of colon, unspecified: Secondary | ICD-10-CM

## 2014-08-16 ENCOUNTER — Encounter: Payer: Self-pay | Admitting: Gastroenterology

## 2014-08-17 ENCOUNTER — Other Ambulatory Visit: Payer: Self-pay

## 2014-08-17 ENCOUNTER — Ambulatory Visit: Payer: Self-pay | Admitting: Oncology

## 2014-09-13 ENCOUNTER — Ambulatory Visit (HOSPITAL_BASED_OUTPATIENT_CLINIC_OR_DEPARTMENT_OTHER): Payer: BLUE CROSS/BLUE SHIELD | Admitting: Oncology

## 2014-09-13 ENCOUNTER — Telehealth: Payer: Self-pay | Admitting: Oncology

## 2014-09-13 ENCOUNTER — Other Ambulatory Visit (HOSPITAL_BASED_OUTPATIENT_CLINIC_OR_DEPARTMENT_OTHER): Payer: BLUE CROSS/BLUE SHIELD

## 2014-09-13 VITALS — BP 134/78 | HR 85 | Temp 98.0°F | Resp 18 | Ht 64.0 in | Wt 171.8 lb

## 2014-09-13 DIAGNOSIS — I10 Essential (primary) hypertension: Secondary | ICD-10-CM

## 2014-09-13 DIAGNOSIS — G629 Polyneuropathy, unspecified: Secondary | ICD-10-CM

## 2014-09-13 DIAGNOSIS — C189 Malignant neoplasm of colon, unspecified: Secondary | ICD-10-CM

## 2014-09-13 DIAGNOSIS — C180 Malignant neoplasm of cecum: Secondary | ICD-10-CM

## 2014-09-13 LAB — CBC WITH DIFFERENTIAL/PLATELET
BASO%: 0.4 % (ref 0.0–2.0)
BASOS ABS: 0 10*3/uL (ref 0.0–0.1)
EOS%: 2.7 % (ref 0.0–7.0)
Eosinophils Absolute: 0.1 10*3/uL (ref 0.0–0.5)
HEMATOCRIT: 39.9 % (ref 34.8–46.6)
HEMOGLOBIN: 13.1 g/dL (ref 11.6–15.9)
LYMPH#: 1.8 10*3/uL (ref 0.9–3.3)
LYMPH%: 39.8 % (ref 14.0–49.7)
MCH: 30.4 pg (ref 25.1–34.0)
MCHC: 32.7 g/dL (ref 31.5–36.0)
MCV: 93 fL (ref 79.5–101.0)
MONO#: 0.4 10*3/uL (ref 0.1–0.9)
MONO%: 8.8 % (ref 0.0–14.0)
NEUT#: 2.2 10*3/uL (ref 1.5–6.5)
NEUT%: 48.3 % (ref 38.4–76.8)
Platelets: 285 10*3/uL (ref 145–400)
RBC: 4.3 10*6/uL (ref 3.70–5.45)
RDW: 13.3 % (ref 11.2–14.5)
WBC: 4.5 10*3/uL (ref 3.9–10.3)

## 2014-09-13 LAB — COMPREHENSIVE METABOLIC PANEL (CC13)
ALK PHOS: 94 U/L (ref 40–150)
ALT: 15 U/L (ref 0–55)
AST: 15 U/L (ref 5–34)
Albumin: 3.5 g/dL (ref 3.5–5.0)
Anion Gap: 10 mEq/L (ref 3–11)
BILIRUBIN TOTAL: 0.44 mg/dL (ref 0.20–1.20)
BUN: 17.1 mg/dL (ref 7.0–26.0)
CO2: 24 mEq/L (ref 22–29)
Calcium: 9.4 mg/dL (ref 8.4–10.4)
Chloride: 108 mEq/L (ref 98–109)
Creatinine: 1 mg/dL (ref 0.6–1.1)
EGFR: 67 mL/min/{1.73_m2} — ABNORMAL LOW (ref >90)
Glucose: 80 mg/dl (ref 70–140)
Potassium: 4.2 mEq/L (ref 3.5–5.1)
Sodium: 141 mEq/L (ref 136–145)
Total Protein: 7.6 g/dL (ref 6.4–8.3)

## 2014-09-13 LAB — CEA: CEA: 1 ng/mL (ref 0.0–5.0)

## 2014-09-13 MED ORDER — HEPARIN SOD (PORK) LOCK FLUSH 100 UNIT/ML IV SOLN
500.0000 [IU] | Freq: Once | INTRAVENOUS | Status: AC
  Administered 2014-09-13: 500 [IU] via INTRAVENOUS
  Filled 2014-09-13: qty 5

## 2014-09-13 MED ORDER — SODIUM CHLORIDE 0.9 % IJ SOLN
10.0000 mL | INTRAMUSCULAR | Status: DC | PRN
  Administered 2014-09-13: 10 mL via INTRAVENOUS
  Filled 2014-09-13: qty 10

## 2014-09-13 NOTE — Progress Notes (Signed)
Hematology and Oncology Follow Up Visit  Margaret Elliott [id number] [birth year]     Principle Diagnosis: This is a 64 year old woman diagnosed with colon cancer in June 2014. She had presented with an abdominal pain and found to have a cecal mass resulting in obstruction and advanced appendicitis.  She has at least stage III disease. Likely has stage IV disease with a liver mass.    Prior Therapy: She is status post laparoscopic laparotomy, evacuation of a pelvic abscess and right hemicolectomy with ileocolonic anastomosis done on December 09, 2012. The pathology showed an invasive, well- differentiated colorectal adenocarcinoma. Tumor invades through the muscularis propria, with 2/18 lymph nodes involved, with the pathological staging T3 N1b disease. Her tumor was found to be microsatellite stable. KRAS mutation is pending. She is also status post Port-A-Cath insertion that was done on 01/05/2013. FOLFOX chemotherapy started 01/18/2013. Avastin to be added with cycle 2 on 02/01/2013. She is S/P 12 cycles completed in 05/2013.   Current therapy: Under evaluation for radioablation of a liver lesion scheduled for April 2016.  Interim History:  Margaret Elliott presents today for a followup visit. Since her last visit, she reports no complaints. She missed her last appointment due to the death of her mother and a month later her daughter got married and have been busy with both occasions.  She has not reported any health-related complaints. Has not reported any change in her bowel habits. Has not reported any hematochezia or melena. Has not reported any fatigue or early satiety.He is able to ambulate, dry and work full time without any decline. Does not report any nausea. Does not report any vomiting. Does not report any pelvic pain. Does not report any bony pain. Her performance status and activity level remains excellent. She still works full-time. Her appetite have been also excellent and she has gained weight. No  recent hospitalizations or illnesses. Rest of her review of systems unremarkable.   Medications: I have reviewed the patient's current medications.  Current Outpatient Prescriptions  Medication Sig Dispense Refill  . acetaminophen (TYLENOL) 500 MG tablet Take 500 mg by mouth every 6 (six) hours as needed for pain.      . diphenhydrAMINE (BENADRYL) 25 mg capsule Take 25-50 mg by mouth daily as needed for itching. For allergic reaction      . lidocaine-prilocaine (EMLA) cream Apply 1 application topically as needed. Apply approx 1/2 tsp to skin over port, prior to chemotherapy treatments  1 kit  3  . Multiple Vitamin (MULTIVITAMIN) tablet Take 1 tablet by mouth daily.      . ondansetron (ZOFRAN) 8 MG tablet Take 1 tablet (8 mg total) by mouth every 8 (eight) hours as needed for nausea.  30 tablet  1   No current facility-administered medications for this visit.     Allergies: No Known Allergies  Past Medical History, Surgical history, Social history, and Family History were reviewed and updated.    Physical Exam:  Blood pressure 119/75, pulse 98, temperature 97.7 F (36.5 C), temperature source Oral, resp. rate 18, height _0  (1.626 m), weight 152 lb (68.947 kg). ECOG: 0 General appearance: alert awake  Head: Normocephalic, without obvious abnormality. Neck: no adenopathy.  Lymph nodes: Cervical, supraclavicular, and axillary nodes normal. Heart:regular rate and rhythm, S1, S2 normal, no murmur, click, rub or gallop Lung:chest clear, no wheezing, rales, normal symmetric air entry Abdomen: soft, non-tender, without masses or organomegaly EXT:no erythema, induration, or nodules Neurological: No deficits noted. Gait and mood are  stable.   CBC    Component Value Date/Time   WBC 4.5 09/13/2014 0914   WBC 4.3 01/04/2013 0837   WBC 8.6 11/10/2011 1329   RBC 4.30 09/13/2014 0914   RBC 3.50* 01/04/2013 0837   RBC 3.95* 11/10/2011 1329   HGB 13.1 09/13/2014 0914   HGB 9.4*  01/04/2013 0837   HGB 11.3* 11/10/2011 1329   HCT 39.9 09/13/2014 0914   HCT 28.5* 01/04/2013 0837   HCT 35.8* 11/10/2011 1329   PLT 285 09/13/2014 0914   PLT 382 01/04/2013 0837   MCV 93.0 09/13/2014 0914   MCV 81.4 01/04/2013 0837   MCV 90.7 11/10/2011 1329   MCH 30.4 09/13/2014 0914   MCH 26.9 01/04/2013 0837   MCH 28.6 11/10/2011 1329   MCHC 32.7 09/13/2014 0914   MCHC 33.0 01/04/2013 0837   MCHC 31.6* 11/10/2011 1329   RDW 13.3 09/13/2014 0914   RDW 15.8* 01/04/2013 0837   LYMPHSABS 1.8 09/13/2014 0914   LYMPHSABS 1.2 12/09/2012 1045   MONOABS 0.4 09/13/2014 0914   MONOABS 0.9 12/09/2012 1045   EOSABS 0.1 09/13/2014 0914   EOSABS 0.0 12/09/2012 1045   BASOSABS 0.0 09/13/2014 0914   BASOSABS 0.0 12/09/2012 1045    Impression and Plan:  64 year old woman with the following issues:  1. Advanced Colon cancer. She presented with a large right cecal mass causing obstruction and abscess and appendicitis. She is status post 12 cycles of chemotherapy with PET/CT scan on 09/12/2013  showed no residual disease actually in the liver that is hypermetabolic.  Most recent PET/CT scan on 04/03/2014 showed recurrent activity in the posterior right hepatic lobe. MRI on 04/13/2014 showed that there is a concern of the right lobe of the liver, a possible mass measuring 12 mm.   She is scheduled for liver directed therapy by interventional radiology in April 2016. She will need to repeat imaging studies in few months after that.   2. Intravenous access. PAC in place without complications. We will flush every 8 weeks.  3. Right-sided back pain related to disc disease: She is doing better at this time.  4. Neuropathy: This is improving slowly after the completion of systemic chemotherapy.  5. Hypertension: Blood pressure is normal at this time. this was exacerbated by systemic chemotherapy.   6. Followup: Will be in 4 months and sooner if needed to.    [facility] MD 09/13/2014

## 2014-09-13 NOTE — Telephone Encounter (Signed)
Pt confirmed labs/ov/flush per 03/24 POF, gave pt AVS and Calendar.... KJ

## 2014-09-19 ENCOUNTER — Other Ambulatory Visit: Payer: Self-pay | Admitting: Radiology

## 2014-09-19 NOTE — Progress Notes (Signed)
Called for orders surgery 09-28-14 pre op 09-21-14

## 2014-09-20 NOTE — Patient Instructions (Addendum)
Margaret Elliott  09/20/2014   Your procedure is scheduled on: Friday 09/28/2014  Report to [facility] Main  Entrance and follow signs to               Radiology at  0930 AM.  Call this number if you have problems the morning of surgery [phone]   Remember:  Do not eat food or drink liquids :After Midnight.     Take these medicines the morning of surgery with A SIP OF WATER: none                               You may not have any metal on your body including hair pins and              piercings  Do not wear jewelry, make-up, lotions, powders or perfumes.             Do not wear nail polish.  Do not shave  48 hours prior to surgery.              Men may shave face and neck.   Do not bring valuables to the hospital. [place].  Contacts, dentures or bridgework may not be worn into surgery.  Leave suitcase in the car. After surgery it may be brought to your room.     Patients discharged the day of surgery will not be allowed to drive home.  Name and phone number of your driver:  Special Instructions: N/A              Please read over the following fact sheets you were given: _____________________________________________________________________             [facility] - Preparing for Surgery Before surgery, you can play an important role.  Because skin is not sterile, your skin needs to be as free of germs as possible.  You can reduce the number of germs on your skin by washing with CHG (chlorahexidine gluconate) soap before surgery.  CHG is an antiseptic cleaner which kills germs and bonds with the skin to continue killing germs even after washing. Please DO NOT use if you have an allergy to CHG or antibacterial soaps.  If your skin becomes reddened/irritated stop using the CHG and inform your nurse when you arrive at Short Stay. Do not shave (including legs and underarms) for at least 48 hours prior to  the first CHG shower.  You may shave your face/neck. Please follow these instructions carefully:  1.  Shower with CHG Soap the night before surgery and the  morning of Surgery.  2.  If you choose to wash your hair, wash your hair first as usual with your  normal  shampoo.  3.  After you shampoo, rinse your hair and body thoroughly to remove the  shampoo.                           4.  Use CHG as you would any other liquid soap.  You can apply chg directly  to the skin and wash                       Gently with  a scrungie or clean washcloth.  5.  Apply the CHG Soap to your body ONLY FROM THE NECK DOWN.   Do not use on face/ open                           Wound or open sores. Avoid contact with eyes, ears mouth and genitals (private parts).                       Wash face,  Genitals (private parts) with your normal soap.             6.  Wash thoroughly, paying special attention to the area where your surgery  will be performed.  7.  Thoroughly rinse your body with warm water from the neck down.  8.  DO NOT shower/wash with your normal soap after using and rinsing off  the CHG Soap.                9.  Pat yourself dry with a clean towel.            10.  Wear clean pajamas.            11.  Place clean sheets on your bed the night of your first shower and do not  sleep with pets. Day of Surgery : Do not apply any lotions/deodorants the morning of surgery.  Please wear clean clothes to the hospital/surgery center.  FAILURE TO FOLLOW THESE INSTRUCTIONS MAY RESULT IN THE CANCELLATION OF YOUR SURGERY PATIENT SIGNATURE_________________________________  NURSE SIGNATURE__________________________________  ________________________________________________________________________   Adam Phenix  An incentive spirometer is a tool that can help keep your lungs clear and active. This tool measures how well you are filling your lungs with each breath. Taking long deep breaths may help reverse or  decrease the chance of developing breathing (pulmonary) problems (especially infection) following:  A long period of time when you are unable to move or be active. BEFORE THE PROCEDURE   If the spirometer includes an indicator to show your best effort, your nurse or respiratory therapist will set it to a desired goal.  If possible, sit up straight or lean slightly forward. Try not to slouch.  Hold the incentive spirometer in an upright position. INSTRUCTIONS FOR USE   Sit on the edge of your bed if possible, or sit up as far as you can in bed or on a chair.  Hold the incentive spirometer in an upright position.  Breathe out normally.  Place the mouthpiece in your mouth and seal your lips tightly around it.  Breathe in slowly and as deeply as possible, raising the piston or the ball toward the top of the column.  Hold your breath for 3-5 seconds or for as long as possible. Allow the piston or ball to fall to the bottom of the column.  Remove the mouthpiece from your mouth and breathe out normally.  Rest for a few seconds and repeat Steps 1 through 7 at least 10 times every 1-2 hours when you are awake. Take your time and take a few normal breaths between deep breaths.  The spirometer may include an indicator to show your best effort. Use the indicator as a goal to work toward during each repetition.  After each set of 10 deep breaths, practice coughing to be sure your lungs are clear. If you have an incision (the cut made at the time of surgery), support your  incision when coughing by placing a pillow or rolled up towels firmly against it. Once you are able to get out of bed, walk around indoors and cough well. You may stop using the incentive spirometer when instructed by your caregiver.  RISKS AND COMPLICATIONS  Take your time so you do not get dizzy or light-headed.  If you are in pain, you may need to take or ask for pain medication before doing incentive spirometry. It is  harder to take a deep breath if you are having pain. AFTER USE  Rest and breathe slowly and easily.  It can be helpful to keep track of a log of your progress. Your caregiver can provide you with a simple table to help with this. If you are using the spirometer at home, follow these instructions: [place] IF:   You are having difficultly using the spirometer.  You have trouble using the spirometer as often as instructed.  Your pain medication is not giving enough relief while using the spirometer.  You develop fever of 100.5 F (38.1 C) or higher. SEEK IMMEDIATE MEDICAL CARE IF:   You cough up bloody sputum that had not been present before.  You develop fever of 102 F (38.9 C) or greater.  You develop worsening pain at or near the incision site. MAKE SURE YOU:   Understand these instructions.  Will watch your condition.  Will get help right away if you are not doing well or get worse. Document Released: 10/19/2006 Document Revised: 08/31/2011 Document Reviewed: 12/20/2006 ExitCare Patient Information 2014 Exit[employer], [place].   ________________________________________________________________________  WHAT IS A BLOOD TRANSFUSION? Blood Transfusion Information  A transfusion is the replacement of blood or some of its parts. Blood is made up of multiple cells which provide different functions.  Red blood cells carry oxygen and are used for blood loss replacement.  White blood cells fight against infection.  Platelets control bleeding.  Plasma helps clot blood.  Other blood products are available for specialized needs, such as hemophilia or other clotting disorders. BEFORE THE TRANSFUSION  Who gives blood for transfusions?   Healthy volunteers who are fully evaluated to make sure their blood is safe. This is blood bank blood. Transfusion therapy is the safest it has ever been in the practice of medicine. Before blood is taken from a donor, a complete history  is taken to make sure that person has no history of diseases nor engages in risky social behavior (examples are intravenous drug use or sexual activity with multiple partners). The donor's travel history is screened to minimize risk of transmitting infections, such as malaria. The donated blood is tested for signs of infectious diseases, such as HIV and hepatitis. The blood is then tested to be sure it is compatible with you in order to minimize the chance of a transfusion reaction. If you or a relative donates blood, this is often done in anticipation of surgery and is not appropriate for emergency situations. It takes many days to process the donated blood. RISKS AND COMPLICATIONS Although transfusion therapy is very safe and saves many lives, the main dangers of transfusion include:   Getting an infectious disease.  Developing a transfusion reaction. This is an allergic reaction to something in the blood you were given. Every precaution is taken to prevent this. The decision to have a blood transfusion has been considered carefully by your caregiver before blood is given. Blood is not given unless the benefits outweigh the risks. AFTER THE TRANSFUSION  Right  after receiving a blood transfusion, you will usually feel much better and more energetic. This is especially true if your red blood cells have gotten low (anemic). The transfusion raises the level of the red blood cells which carry oxygen, and this usually causes an energy increase.  The nurse administering the transfusion will monitor you carefully for complications. HOME CARE INSTRUCTIONS  No special instructions are needed after a transfusion. You may find your energy is better. Speak with your caregiver about any limitations on activity for underlying diseases you may have. SEEK MEDICAL CARE IF:   Your condition is not improving after your transfusion.  You develop redness or irritation at the intravenous (IV) site. SEEK IMMEDIATE  MEDICAL CARE IF:  Any of the following symptoms occur over the next 12 hours:  Shaking chills.  You have a temperature by mouth above 102 F (38.9 C), not controlled by medicine.  Chest, back, or muscle pain.  People around you feel you are not acting correctly or are confused.  Shortness of breath or difficulty breathing.  Dizziness and fainting.  You get a rash or develop hives.  You have a decrease in urine output.  Your urine turns a dark color or changes to pink, red, or brown. Any of the following symptoms occur over the next 10 days:  You have a temperature by mouth above 102 F (38.9 C), not controlled by medicine.  Shortness of breath.  Weakness after normal activity.  The white part of the eye turns yellow (jaundice).  You have a decrease in the amount of urine or are urinating less often.  Your urine turns a dark color or changes to pink, red, or brown. Document Released: 06/05/2000 Document Revised: 08/31/2011 Document Reviewed: 01/23/2008 [facility] Patient Information 2014 [place], [place].  _______________________________________________________________________

## 2014-09-21 ENCOUNTER — Encounter (HOSPITAL_COMMUNITY): Payer: Self-pay

## 2014-09-21 ENCOUNTER — Encounter (HOSPITAL_COMMUNITY)
Admission: RE | Admit: 2014-09-21 | Discharge: 2014-09-21 | Disposition: A | Discharge status: Home / Self Care | Payer: BLUE CROSS/BLUE SHIELD | Source: Ambulatory Visit | Attending: Interventional Radiology | Admitting: Interventional Radiology

## 2014-09-21 DIAGNOSIS — Z01812 Encounter for preprocedural laboratory examination: Secondary | ICD-10-CM | POA: Insufficient documentation

## 2014-09-21 DIAGNOSIS — Z01810 Encounter for preprocedural cardiovascular examination: Secondary | ICD-10-CM | POA: Diagnosis not present

## 2014-09-21 LAB — PROTIME-INR
INR: 0.96 (ref 0.00–1.49)
PROTHROMBIN TIME: 12.9 s (ref 11.6–15.2)

## 2014-09-21 LAB — ABO/RH: ABO/RH(D): O POS

## 2014-09-21 LAB — APTT: aPTT: 29 seconds (ref 24–37)

## 2014-09-27 ENCOUNTER — Other Ambulatory Visit: Payer: Self-pay | Admitting: Radiology

## 2014-09-28 ENCOUNTER — Observation Stay (HOSPITAL_COMMUNITY)
Admission: RE | Admit: 2014-09-28 | Discharge: 2014-09-29 | Disposition: A | Discharge status: Home / Self Care | Payer: BLUE CROSS/BLUE SHIELD | Source: Ambulatory Visit | Attending: Interventional Radiology | Admitting: Interventional Radiology

## 2014-09-28 ENCOUNTER — Encounter (HOSPITAL_COMMUNITY)
Admission: RE | Disposition: A | Discharge status: Home / Self Care | Payer: Self-pay | Source: Ambulatory Visit | Attending: Interventional Radiology

## 2014-09-28 ENCOUNTER — Encounter (HOSPITAL_COMMUNITY): Payer: Self-pay

## 2014-09-28 ENCOUNTER — Ambulatory Visit (HOSPITAL_COMMUNITY): Payer: BLUE CROSS/BLUE SHIELD | Admitting: Certified Registered Nurse Anesthetist

## 2014-09-28 ENCOUNTER — Ambulatory Visit (HOSPITAL_COMMUNITY): Payer: BLUE CROSS/BLUE SHIELD

## 2014-09-28 ENCOUNTER — Ambulatory Visit (HOSPITAL_COMMUNITY)
Admission: RE | Admit: 2014-09-28 | Discharge: 2014-09-28 | Disposition: A | Discharge status: Home / Self Care | Payer: BLUE CROSS/BLUE SHIELD | Source: Ambulatory Visit | Attending: Interventional Radiology | Admitting: Interventional Radiology

## 2014-09-28 DIAGNOSIS — C189 Malignant neoplasm of colon, unspecified: Secondary | ICD-10-CM | POA: Insufficient documentation

## 2014-09-28 DIAGNOSIS — Z79899 Other long term (current) drug therapy: Secondary | ICD-10-CM | POA: Insufficient documentation

## 2014-09-28 DIAGNOSIS — Z01818 Encounter for other preprocedural examination: Secondary | ICD-10-CM | POA: Diagnosis not present

## 2014-09-28 DIAGNOSIS — Z87891 Personal history of nicotine dependence: Secondary | ICD-10-CM | POA: Insufficient documentation

## 2014-09-28 DIAGNOSIS — C787 Secondary malignant neoplasm of liver and intrahepatic bile duct: Secondary | ICD-10-CM | POA: Insufficient documentation

## 2014-09-28 DIAGNOSIS — D649 Anemia, unspecified: Secondary | ICD-10-CM | POA: Insufficient documentation

## 2014-09-28 LAB — COMPREHENSIVE METABOLIC PANEL
ALT: 13 U/L (ref 0–35)
AST: 17 U/L (ref 0–37)
Albumin: 4.1 g/dL (ref 3.5–5.2)
Alkaline Phosphatase: 88 U/L (ref 39–117)
Anion gap: 10 (ref 5–15)
BUN: 18 mg/dL (ref 6–23)
CO2: 24 mmol/L (ref 19–32)
Calcium: 9.6 mg/dL (ref 8.4–10.5)
Chloride: 106 mmol/L (ref 96–112)
Creatinine, Ser: 1.04 mg/dL (ref 0.50–1.10)
GFR calc non Af Amer: 56 mL/min — ABNORMAL LOW (ref >90)
GFR, EST AFRICAN AMERICAN: 64 mL/min — AB (ref >90)
GLUCOSE: 94 mg/dL (ref 70–99)
Potassium: 4.1 mmol/L (ref 3.5–5.1)
Sodium: 140 mmol/L (ref 135–145)
TOTAL PROTEIN: 8.1 g/dL (ref 6.0–8.3)
Total Bilirubin: 0.7 mg/dL (ref 0.3–1.2)

## 2014-09-28 LAB — CBC WITH DIFFERENTIAL/PLATELET
Basophils Absolute: 0 10*3/uL (ref 0.0–0.1)
Basophils Relative: 0 % (ref 0–1)
EOS ABS: 0.1 10*3/uL (ref 0.0–0.7)
Eosinophils Relative: 2 % (ref 0–5)
HCT: 40 % (ref 36.0–46.0)
Hemoglobin: 13.2 g/dL (ref 12.0–15.0)
Lymphocytes Relative: 37 % (ref 12–46)
Lymphs Abs: 1.8 10*3/uL (ref 0.7–4.0)
MCH: 30.8 pg (ref 26.0–34.0)
MCHC: 33 g/dL (ref 30.0–36.0)
MCV: 93.5 fL (ref 78.0–100.0)
MONOS PCT: 7 % (ref 3–12)
Monocytes Absolute: 0.3 10*3/uL (ref 0.1–1.0)
NEUTROS ABS: 2.6 10*3/uL (ref 1.7–7.7)
NEUTROS PCT: 54 % (ref 43–77)
PLATELETS: 287 10*3/uL (ref 150–400)
RBC: 4.28 MIL/uL (ref 3.87–5.11)
RDW: 12.8 % (ref 11.5–15.5)
WBC: 4.8 10*3/uL (ref 4.0–10.5)

## 2014-09-28 LAB — TYPE AND SCREEN
ABO/RH(D): O POS
Antibody Screen: NEGATIVE

## 2014-09-28 SURGERY — RADIO FREQUENCY ABLATION
Anesthesia: General

## 2014-09-28 MED ORDER — HYDROMORPHONE HCL 1 MG/ML IJ SOLN
0.2500 mg | INTRAMUSCULAR | Status: DC | PRN
  Administered 2014-09-28 (×2): 0.5 mg via INTRAVENOUS

## 2014-09-28 MED ORDER — PHENYLEPHRINE HCL 10 MG/ML IJ SOLN
INTRAMUSCULAR | Status: DC | PRN
  Administered 2014-09-28 (×2): 40 ug via INTRAVENOUS

## 2014-09-28 MED ORDER — DEXAMETHASONE SODIUM PHOSPHATE 10 MG/ML IJ SOLN
INTRAMUSCULAR | Status: AC
  Filled 2014-09-28: qty 1

## 2014-09-28 MED ORDER — SENNOSIDES-DOCUSATE SODIUM 8.6-50 MG PO TABS: 1.0000 | ORAL_TABLET | Freq: Every day | ORAL | Status: DC | PRN

## 2014-09-28 MED ORDER — MIDAZOLAM HCL 2 MG/2ML IJ SOLN
INTRAMUSCULAR | Status: AC
  Filled 2014-09-28: qty 2

## 2014-09-28 MED ORDER — ONDANSETRON HCL 4 MG/2ML IJ SOLN
INTRAMUSCULAR | Status: AC
  Filled 2014-09-28: qty 2

## 2014-09-28 MED ORDER — DIPHENHYDRAMINE HCL 50 MG/ML IJ SOLN
12.5000 mg | Freq: Four times a day (QID) | INTRAMUSCULAR | Status: DC | PRN
Start: 2014-09-28 — End: 2014-09-28

## 2014-09-28 MED ORDER — ONDANSETRON HCL 4 MG/2ML IJ SOLN
INTRAMUSCULAR | Status: DC | PRN
  Administered 2014-09-28: 4 mg via INTRAVENOUS

## 2014-09-28 MED ORDER — DIPHENHYDRAMINE HCL 12.5 MG/5ML PO ELIX: 12.5000 mg | ORAL_SOLUTION | Freq: Four times a day (QID) | ORAL | Status: DC | PRN

## 2014-09-28 MED ORDER — PIPERACILLIN-TAZOBACTAM 3.375 G IVPB
3.3750 g | Freq: Once | INTRAVENOUS | Status: AC
  Administered 2014-09-28: 3.375 g via INTRAVENOUS
  Filled 2014-09-28: qty 50

## 2014-09-28 MED ORDER — PROPOFOL 10 MG/ML IV BOLUS
INTRAVENOUS | Status: DC | PRN
  Administered 2014-09-28: 150 mg via INTRAVENOUS
  Administered 2014-09-28: 50 mg via INTRAVENOUS

## 2014-09-28 MED ORDER — ONDANSETRON HCL 4 MG/2ML IJ SOLN
4.0000 mg | Freq: Four times a day (QID) | INTRAMUSCULAR | Status: DC | PRN
  Administered 2014-09-28 – 2014-09-29 (×3): 4 mg via INTRAVENOUS
  Filled 2014-09-28 (×3): qty 2

## 2014-09-28 MED ORDER — HYDROMORPHONE HCL 1 MG/ML IJ SOLN
INTRAMUSCULAR | Status: AC
  Filled 2014-09-28: qty 1

## 2014-09-28 MED ORDER — FENTANYL 10 MCG/ML IV SOLN
INTRAVENOUS | Status: DC
  Administered 2014-09-28: 15:00:00 via INTRAVENOUS
  Filled 2014-09-28: qty 50

## 2014-09-28 MED ORDER — CHLORHEXIDINE GLUCONATE 0.12 % MT SOLN
15.0000 mL | Freq: Two times a day (BID) | OROMUCOSAL | Status: DC
Start: 2014-09-28 — End: 2014-09-29
  Administered 2014-09-29 (×2): 15 mL via OROMUCOSAL
  Filled 2014-09-28 (×4): qty 15

## 2014-09-28 MED ORDER — PROPOFOL 10 MG/ML IV BOLUS
INTRAVENOUS | Status: AC
  Filled 2014-09-28: qty 20

## 2014-09-28 MED ORDER — CETYLPYRIDINIUM CHLORIDE 0.05 % MT LIQD: 7.0000 mL | Freq: Two times a day (BID) | OROMUCOSAL | Status: DC

## 2014-09-28 MED ORDER — NEOSTIGMINE METHYLSULFATE 10 MG/10ML IV SOLN
INTRAVENOUS | Status: DC | PRN
  Administered 2014-09-28: 5 mg via INTRAVENOUS

## 2014-09-28 MED ORDER — LIDOCAINE HCL (CARDIAC) 20 MG/ML IV SOLN
INTRAVENOUS | Status: AC
  Filled 2014-09-28: qty 5

## 2014-09-28 MED ORDER — MIDAZOLAM HCL 5 MG/5ML IJ SOLN
INTRAMUSCULAR | Status: DC | PRN
  Administered 2014-09-28: 2 mg via INTRAVENOUS

## 2014-09-28 MED ORDER — FENTANYL CITRATE 0.05 MG/ML IJ SOLN
INTRAMUSCULAR | Status: DC | PRN
  Administered 2014-09-28 (×3): 50 ug via INTRAVENOUS

## 2014-09-28 MED ORDER — SODIUM CHLORIDE 0.9 % IV SOLN
INTRAVENOUS | Status: DC
  Administered 2014-09-28: 16:00:00 via INTRAVENOUS
  Administered 2014-09-29: 100 mL/h via INTRAVENOUS

## 2014-09-28 MED ORDER — ONDANSETRON HCL 4 MG/2ML IJ SOLN
4.0000 mg | Freq: Four times a day (QID) | INTRAMUSCULAR | Status: DC | PRN
Start: 2014-09-28 — End: 2014-09-28

## 2014-09-28 MED ORDER — ROCURONIUM BROMIDE 100 MG/10ML IV SOLN
INTRAVENOUS | Status: AC
  Filled 2014-09-28: qty 1

## 2014-09-28 MED ORDER — LIDOCAINE HCL (CARDIAC) 20 MG/ML IV SOLN
INTRAVENOUS | Status: DC | PRN
  Administered 2014-09-28: 100 mg via INTRAVENOUS

## 2014-09-28 MED ORDER — PHENYLEPHRINE HCL 10 MG/ML IJ SOLN
10.0000 mg | INTRAMUSCULAR | Status: DC | PRN
  Administered 2014-09-28: 25 ug/min via INTRAVENOUS

## 2014-09-28 MED ORDER — DOCUSATE SODIUM 100 MG PO CAPS
100.0000 mg | ORAL_CAPSULE | Freq: Two times a day (BID) | ORAL | Status: DC
  Administered 2014-09-28 – 2014-09-29 (×2): 100 mg via ORAL
  Filled 2014-09-28 (×3): qty 1

## 2014-09-28 MED ORDER — GLYCOPYRROLATE 0.2 MG/ML IJ SOLN
INTRAMUSCULAR | Status: DC | PRN
  Administered 2014-09-28: 0.6 mg via INTRAVENOUS

## 2014-09-28 MED ORDER — IOHEXOL 300 MG/ML  SOLN
75.0000 mL | Freq: Once | INTRAMUSCULAR | Status: AC | PRN
  Administered 2014-09-28: 75 mL via INTRAVENOUS

## 2014-09-28 MED ORDER — FENTANYL CITRATE 0.05 MG/ML IJ SOLN
INTRAMUSCULAR | Status: AC
  Filled 2014-09-28: qty 5

## 2014-09-28 MED ORDER — FENTANYL 10 MCG/ML IV SOLN
INTRAVENOUS | Status: DC
  Administered 2014-09-28: 450 ug/h via INTRAVENOUS
  Administered 2014-09-28: 15 ug via INTRAVENOUS
  Administered 2014-09-28 – 2014-09-29 (×2): via INTRAVENOUS
  Administered 2014-09-29: 30 ug via INTRAVENOUS
  Administered 2014-09-29: 60 ug via INTRAVENOUS
  Administered 2014-09-29: 435 ug/h via INTRAVENOUS
  Filled 2014-09-28 (×3): qty 50

## 2014-09-28 MED ORDER — GLYCOPYRROLATE 0.2 MG/ML IJ SOLN
INTRAMUSCULAR | Status: AC
  Filled 2014-09-28: qty 2

## 2014-09-28 MED ORDER — DIPHENHYDRAMINE HCL 50 MG/ML IJ SOLN
12.5000 mg | Freq: Four times a day (QID) | INTRAMUSCULAR | Status: DC | PRN
Start: 2014-09-28 — End: 2014-09-29

## 2014-09-28 MED ORDER — NALOXONE HCL 0.4 MG/ML IJ SOLN: 0.4000 mg | INTRAMUSCULAR | Status: DC | PRN

## 2014-09-28 MED ORDER — ROCURONIUM BROMIDE 100 MG/10ML IV SOLN
INTRAVENOUS | Status: DC | PRN
Start: 2014-09-28 — End: 2014-09-28
  Administered 2014-09-28: 30 mg via INTRAVENOUS
  Administered 2014-09-28: 10 mg via INTRAVENOUS

## 2014-09-28 MED ORDER — IOHEXOL 300 MG/ML  SOLN: 75.0000 mg | Freq: Once | INTRAMUSCULAR | Status: DC | PRN

## 2014-09-28 MED ORDER — DIPHENHYDRAMINE HCL 12.5 MG/5ML PO ELIX
12.5000 mg | ORAL_SOLUTION | Freq: Four times a day (QID) | ORAL | Status: DC | PRN
  Filled 2014-09-28: qty 5

## 2014-09-28 MED ORDER — HYDROCODONE-ACETAMINOPHEN 5-325 MG PO TABS
1.0000 | ORAL_TABLET | ORAL | Status: DC | PRN
  Administered 2014-09-29 (×2): 2 via ORAL
  Filled 2014-09-28 (×2): qty 2
  Filled 2014-09-28: qty 1

## 2014-09-28 MED ORDER — SUCCINYLCHOLINE CHLORIDE 20 MG/ML IJ SOLN
INTRAMUSCULAR | Status: DC | PRN
  Administered 2014-09-28: 100 mg via INTRAVENOUS

## 2014-09-28 MED ORDER — NALOXONE HCL 0.4 MG/ML IJ SOLN
0.4000 mg | INTRAMUSCULAR | Status: DC | PRN
Start: 2014-09-28 — End: 2014-09-28

## 2014-09-28 MED ORDER — LACTATED RINGERS IV SOLN: INTRAVENOUS | Status: DC

## 2014-09-28 MED ORDER — LACTATED RINGERS IV SOLN
INTRAVENOUS | Status: DC
  Administered 2014-09-28 (×2): via INTRAVENOUS

## 2014-09-28 MED ORDER — SODIUM CHLORIDE 0.9 % IJ SOLN
9.0000 mL | INTRAMUSCULAR | Status: DC | PRN
Start: 2014-09-28 — End: 2014-09-28

## 2014-09-28 MED ORDER — SODIUM CHLORIDE 0.9 % IJ SOLN: 9.0000 mL | INTRAMUSCULAR | Status: DC | PRN

## 2014-09-28 NOTE — Procedures (Signed)
Technically successful CT guided microwave ablation of solitary liver metastasis. No immediate post procedural complications.

## 2014-09-28 NOTE — Progress Notes (Signed)
Day of Surgery  Subjective: Patient doing well; denies significant abdominal pain, nausea or vomiting  Objective: Vital signs in last 24 hours: Temp:  [97.6 F (36.4 C)-98 F (36.7 C)] 98 F (36.7 C) (04/08 1639) Pulse Rate:  [72-94] 72 (04/08 1639) Resp:  [10-18] 12 (04/08 1639) BP: (100-140)/(58-88) 100/58 mmHg (04/08 1639) SpO2:  [100 %] 100 % (04/08 1639) Weight:  [171 lb (77.565 kg)] 171 lb (77.565 kg) (04/08 1010)    Intake/Output from previous day:   Intake/Output this shift: Total I/O In: 1100 [I.V.:1100] Out: 750 [Urine:750]  Puncture sites right  flank region clean, dry, nontender, no obvious hematomas  Lab Results:   Recent Labs  09/28/14 0955  WBC 4.8  HGB 13.2  HCT 40.0  PLT 287   BMET  Recent Labs  09/28/14 0955  NA 140  K 4.1  CL 106  CO2 24  GLUCOSE 94  BUN 18  CREATININE 1.04  CALCIUM 9.6   PT/INR No results for input(s): LABPROT, INR in the last 72 hours. ABG No results for input(s): PHART, HCO3 in the last 72 hours.  Invalid input(s): PCO2, PO2  Studies/Results: Dg Chest 1 View  09/28/2014   CLINICAL DATA:  Power Port catheter.  Preoperative chest x-ray.  EXAM: CHEST  1 VIEW  COMPARISON:  PET-CT 04/03/2014.  Chest x-ray 12/09/2012.  FINDINGS: Power port catheter noted with tip projected over the cavoatrial junction. Mediastinum and hilar structures normal. Lungs are clear. Heart size normal. Tiny stable left upper lobe pulmonary nodule. No pleural effusion or pneumothorax. No acute bony abnormality.  IMPRESSION: 1. Power port catheter in good anatomic position. 2. No acute cardiopulmonary disease.   Electronically Signed   By: Marcello Moores  Register   On: 09/28/2014 09:51   Ct Guide Tissue Ablation  09/28/2014   CLINICAL DATA:  History of colon cancer, now with hypermetabolic solitary hepatic metastasis. Please perform CT-guided percutaneous microwave ablation  EXAM: CT-GUIDED PERCUTANEOUS MICROWAVE ABLATION OF LIVER LESION.  COMPARISON:   PET-CT - 04/03/2014; abdominal MRI - 04/13/2014  ANESTHESIA/SEDATION: General  MEDICATIONS: Zosyn 3.375 g IV; The antibiotic was administered in an appropriate time interval prior to needle puncture of the skin.  CONTRAST:  A total of 150 cc Omnipaque 300 was administered intravenously  PROCEDURE: The procedure, risks, benefits, and alternatives were explained to the patient. Questions regarding the procedure were encouraged and answered. The patient understands and consents to the procedure.  The patient was placed under general anesthesia. Initial unenhanced CT was performed in a prone position to localize ill-defined hypo attenuating lesion within in the subcapsular aspect of the posterior segment of the right lobe of the liver (representative image 22, series 2). Intravenous contrast was then administered to better define the lesion which measured approximately 1.7 x 1.4 cm and maximal oblique axial diameter (image 31, series 35). The procedure was planned.  The patient was prepped with Betadine in a sterile fashion, and a sterile drape was applied covering the operative field. A sterile gown and sterile gloves were used for the procedure.  A 22 gauge spinal needle was advanced in expected trajectory of the microwave ablation probe for procedural planning. Once the appropriate trajectory was established, a 15 cm length percutaneous microwave ablation probe was advanced into the hypo attenuating hepatic lesion under intermittent CT guidance. Once appropriate positioning was confirmed, the microwave ablation probe was frozen locked in place.  Next, with the use of a 22 gauge spinal needle, the left posterior perihepatic/perirenal space was accessed.  Appropriate positioning was confirmed and hydrodissection was performed with saline and dilute contrast.  Final microwave ablation probe positioning was confirmed and filed by a 10 minute ablation at 65 watts with intra procedural imaging obtained at 3 and 8 minutes.   Tract ablation was performed as the microwave ablation probe was removed. Hemostasis was achieved with manual compression. A limited postprocedural scan was performed. A dressing was placed.  FINDINGS: Suspected minimal increase in size of the now approximately 1.7 x 1.4 cm ill-defined hypo attenuating lesion within the subcapsular aspect of the posterior segment of the right lobe of the liver.  Under intermittent CT guidance, a percutaneous microwave ablation probe was inserted into the mass and a 10 minutes ablation was performed. Intra procedural imaging demonstrated appropriate formation of the ablation zone without evidence of complication.  Post ablation contrast enhanced imaging demonstrates an adequate ablation zone encompassing the ill-defined hypoattenuating hepatic lesion and was negative for obvious complication, specifically, no pneumothorax or significant hemorrhage about the ablation site.  IMPRESSION: Successful CT guided percutaneous microwave ablation of solitary ill-defined hypo attenuating lesion within the subcapsular aspect of the posterior segment of the right lobe of the liver. The patient will be observed overnight.  PLAN: - Patient will be seen in initial follow-up consultation at the interventional radiology clinic in approximately 4 weeks.  - Postprocedural surveillance abdominal MRI and will be obtained in approximately 3 months (12/2014).   Electronically Signed   By: Sandi Mariscal M.D.   On: 09/28/2014 15:46    Anti-infectives: Anti-infectives    None      Assessment/Plan: s/p CT guided microwave ablation of solitary right hepatic lobe liver metastasis from colon cancer; for overnight observation; hydrate ; Follow-up with Dr. Pascal Lux in interventional radiology clinic in 1 month; follow-up MRI of the abdomen in 3 months       ALLRED,D [facility] 09/28/2014

## 2014-09-28 NOTE — Transfer of Care (Signed)
Immediate Anesthesia Transfer of Care Note  Patient: Margaret Elliott  Procedure(s) Performed: Procedure(s) (LRB): RADIO FREQUENCY ABLATION (N/A)  Patient Location: PACU  Anesthesia Type: General  Level of Consciousness: sedated, patient cooperative and responds to stimulation  Airway & Oxygen Therapy: Patient Spontanous Breathing and Patient connected to face mask oxgen  Post-op Assessment: Report given to PACU RN and Post -op Vital signs reviewed and stable  Post vital signs: Reviewed and stable  Complications: No apparent anesthesia complications

## 2014-09-28 NOTE — Anesthesia Postprocedure Evaluation (Signed)
  Anesthesia Post-op Note  Patient: [name]  Procedure(s) Performed: Procedure(s) (LRB): RADIO FREQUENCY ABLATION (N/A)  Patient Location: PACU  Anesthesia Type: General  Level of Consciousness: awake and alert   Airway and Oxygen Therapy: Patient Spontanous Breathing  Post-op Pain: mild  Post-op Assessment: Post-op Vital signs reviewed, Patient's Cardiovascular Status Stable, Respiratory Function Stable, Patent Airway and No signs of Nausea or vomiting  Last Vitals:  Filed Vitals:   09/28/14 1500  BP: 106/60  Pulse:   Temp: 36.7 C  Resp:     Post-op Vital Signs: stable   Complications: No apparent anesthesia complications

## 2014-09-28 NOTE — Anesthesia Procedure Notes (Signed)
Procedure Name: Intubation Date/Time: 09/28/2014 11:38 AM Performed by: Maxwell Caul Pre-anesthesia Checklist: Patient identified, Emergency Drugs available, Suction available and Patient being monitored Patient Re-evaluated:Patient Re-evaluated prior to inductionOxygen Delivery Method: Circle System Utilized Preoxygenation: Pre-oxygenation with 100% oxygen Intubation Type: IV induction Ventilation: Mask ventilation without difficulty Laryngoscope Size: Mac and 4 Grade View: Grade II Tube type: Oral Tube size: 7.0 mm Number of attempts: 1 Airway Equipment and Method: Stylet and Oral airway Placement Confirmation: ETT inserted through vocal cords under direct vision,  positive ETCO2 and breath sounds checked- equal and bilateral Secured at: 21 cm Tube secured with: Tape Dental Injury: Teeth and Oropharynx as per pre-operative assessment

## 2014-09-28 NOTE — Progress Notes (Signed)
24 hour PCA use is 15 mcg.  Pt educated on PCA usage.  Alert and oriented.  Pt verbalized understanding.  No other concerns at this time.  Vitals stable.  Iantha Fallen RN 4:14 PM 09/28/2014

## 2014-09-28 NOTE — H&P (Signed)
Chief Complaint: Metastatic colon cancer  Referring Physician(s): Dr. Alen Blew  History of Present Illness: Margaret Elliott is a 64 y.o. female with past medical history significant only for anemia, who was initially diagnosed with colon cancer in June 2014, when she presented with abdominal pain and was found to have a cecal mass resulting in obstruction and appendicitis. At initial presentation, she was noted to have an indeterminate liver lesion which was worrisome for metastatic disease. The patient subsequently underwent a laparoscopic right hemicolectomy with ileal-colonic anastomosis on 12/09/2012 with pathology demonstrating invasive, well-differentiated colorectal adenocarcinoma with tumor invading the muscularis propria and involvement of 2/18 sampled mesenteric lymph nodes with pathologic staging of T3, N1B disease. She completed 12 cycles with FOLFOX and Avastin with last chemotherapy administered on 06/23/2013. PET/CT scan performed on 04/03/14 revealed recurrent focus of hypermetabolic activity in the posterior right hepatic lobe, suspicious for liver metastasis. Subsequent MRI of the abdomen on 04/13/14 revealed right hepatic lobe lesion between segments 6 and 7 currently measuring 12 mm with imaging characteristics compatible with metastatic lesion. Patient presents today following IR consultation for elective CT-guided thermal ablation of the right hepatic lobe lesion.  Past Medical History  Diagnosis Date  . Anemia   . Cancer     Colon    Past Surgical History  Procedure Laterality Date  . Abdominal hysterectomy    . Ganglion cyst excision    . Partial colectomy Right 12/09/2012    Procedure:  Right Hemi-colectomy, Resection of Distal Ileum;  Surgeon: Ralene Ok, MD;  Location: [place];  Service: General;  Laterality: Right;  . Appendectomy N/A 12/09/2012    Procedure: APPENDECTOMY;  Surgeon: Ralene Ok, MD;  Location: [place];  Service: General;  Laterality: N/A;  .  Laparoscopy N/A 12/09/2012    Procedure: LAPAROSCOPY DIAGNOSTIC;  Surgeon: Ralene Ok, MD;  Location: [place];  Service: General;  Laterality: N/A;  . Portacath placement Left 01/04/2013    Procedure: INSERTION PORT-A-CATH;  Surgeon: Ralene Ok, MD;  Location: [place];  Service: General;  Laterality: Left;  . Tubal ligation    . Abdominal hysterectomy      Allergies: Review of patient's allergies indicates no known allergies.  Medications: Prior to Admission medications   Medication Sig Start Date End Date Taking? Authorizing Provider  acetaminophen (TYLENOL) 500 MG tablet Take 500 mg by mouth every 6 (six) hours as needed for pain.   Yes Historical Provider, MD  diphenhydrAMINE (BENADRYL) 25 mg capsule Take 25-50 mg by mouth daily as needed for itching. For allergic reaction   Yes Historical Provider, MD  lidocaine-prilocaine (EMLA) cream Apply 1 application topically as needed. Apply approx 1/2 tsp to skin over port, prior to chemotherapy treatments 01/11/13  Yes Wyatt Portela, MD  Multiple Vitamin (MULTIVITAMIN) tablet Take 1 tablet by mouth daily.   Yes Historical Provider, MD  amLODipine (NORVASC) 5 MG tablet Take 1 tablet (5 mg total) by mouth daily. Patient not taking: Reported on 09/20/2014 05/10/13   Wyatt Portela, MD  diphenoxylate-atropine (LOMOTIL) 2.5-0.025 MG per tablet Take 1 tablet by mouth 4 (four) times daily as needed for diarrhea or loose stools. Patient not taking: Reported on 09/20/2014 05/24/13   Wyatt Portela, MD    Family History  Problem Relation Age of Onset  . Heart disease Mother   . COPD Mother     History   Social History  . Marital Status: Married    Spouse Name: N/A  . Number of  Children: 3  . Years of Education: N/A   Occupational History  . Sales Other   Social History Main Topics  . Smoking status: Former Smoker    Types: Cigarettes    Quit date: 06/22/1972  . Smokeless tobacco: Never Used  . Alcohol Use: Yes     Comment: wine a  couple of times a week, occ  . Drug Use: No  . Sexual Activity: Not on file   Other Topics Concern  . None   Social History Narrative      Review of Systems  Constitutional: Positive for fatigue. Negative for fever and chills.  Respiratory: Negative for cough and shortness of breath.   Cardiovascular: Negative for chest pain.  Gastrointestinal: Negative for nausea, vomiting, abdominal pain and blood in stool.  Genitourinary: Negative for dysuria and hematuria.  Musculoskeletal: Negative for back pain.  Neurological: Negative for headaches.  Hematological: Does not bruise/bleed easily.    Vital Signs: Ht 5\' 4"  (1.626 m)  Wt 171 lb (77.565 kg)  BMI 29.34 kg/m2  blood pressure 140/88, temperature 97.9, pulse rate 94, respirations 18, oxygen saturation is 100% on room air  Physical Exam  Constitutional: She is oriented to person, place, and time. She appears well-developed and well-nourished.  Cardiovascular: Normal rate and regular rhythm.   Clean, intact left chest wall Port-A-Cath  Pulmonary/Chest: Effort normal and breath sounds normal.  Abdominal: Soft. Bowel sounds are normal. There is no tenderness.  Musculoskeletal: Normal range of motion. She exhibits no edema.  Neurological: She is alert and oriented to person, place, and time.    Imaging: Dg Chest 1 View  09/28/2014   CLINICAL DATA:  Power Port catheter.  Preoperative chest x-ray.  EXAM: CHEST  1 VIEW  COMPARISON:  PET-CT 04/03/2014.  Chest x-ray 12/09/2012.  FINDINGS: Power port catheter noted with tip projected over the cavoatrial junction. Mediastinum and hilar structures normal. Lungs are clear. Heart size normal. Tiny stable left upper lobe pulmonary nodule. No pleural effusion or pneumothorax. No acute bony abnormality.  IMPRESSION: 1. Power port catheter in good anatomic position. 2. No acute cardiopulmonary disease.   Electronically Signed   By: Marcello Moores  Register   On: 09/28/2014 09:51     Labs:  CBC:  Recent Labs  04/03/14 7867 05/22/14 0810 09/13/14 0914 09/28/14 0955  WBC 3.5* 4.3 4.5 4.8  HGB 12.1 12.2 13.1 13.2  HCT 36.6 37.6 39.9 40.0  PLT 231 254 285 287    COAGS:  Recent Labs  09/21/14 0830  INR 0.96  APTT 29    BMP:  Recent Labs  01/03/14 0810 04/03/14 0812 05/22/14 0810 09/13/14 0915  NA 140 142 140 141  K 3.7 4.0 4.0 4.2  CO2 23 24 25 24   GLUCOSE 116 95 89 80  BUN 21.9 18.6 15.0 17.1  CALCIUM 9.4 9.4 9.2 9.4  CREATININE 1.1 1.1 0.9 1.0    LIVER FUNCTION TESTS:  Recent Labs  01/03/14 0810 04/03/14 0812 05/22/14 0810 09/13/14 0915  BILITOT 0.86 0.41 0.46 0.44  AST 15 14 17 15   ALT 10 12 12 15   ALKPHOS 77 119 91 94  PROT 7.4 7.3 7.0 7.6  ALBUMIN 3.4* 3.3* 3.4* 3.5    TUMOR MARKERS:  Recent Labs  01/03/14 0810 04/03/14 0812 08/09/14 0916 09/13/14 0915  CEA 0.7 0.6 1.0 1.0    Assessment and Plan: Patient with history of metastatic colon cancer and hypermetabolic right hepatic lobe lesion on recent PET scan; now scheduled for elective CT  guided percutaneous thermal ablation of the right hepatic lobe lesion. Details/risks of procedure, including but not limited to internal bleeding, infection, anesthesia related difficulties, and death were discussed with patient and husband with their understanding and consent. Following procedure patient will be admitted for overnight observation.     Signed: Autumn Messing 09/28/2014, 10:21 AM   I spent a total of 30 minutes face to face in clinical consultation, greater than 50% of which was counseling/coordinating care for CT-guided thermal ablation of right hepatic lobe lesion

## 2014-09-28 NOTE — Anesthesia Preprocedure Evaluation (Addendum)
Anesthesia Evaluation  Patient identified by MRN, date of birth, ID band Patient awake    Reviewed: Allergy & Precautions, H&P , NPO status , Patient's Chart, lab work & pertinent test results  Airway Mallampati: II  TM Distance: >3 FB Neck ROM: full    Dental no notable dental hx. (+) Teeth Intact, Dental Advisory Given   Pulmonary neg pulmonary ROS, former smoker,  breath sounds clear to auscultation  Pulmonary exam normal       Cardiovascular Exercise Tolerance: Good negative cardio ROS  Rhythm:regular Rate:Normal     Neuro/Psych negative neurological ROS  negative psych ROS   GI/Hepatic Liver mets Colon cancer   Endo/Other  negative endocrine ROS  Renal/GU negative Renal ROS  negative genitourinary   Musculoskeletal   Abdominal   Peds  Hematology negative hematology ROS (+)   Anesthesia Other Findings   Reproductive/Obstetrics negative OB ROS                            Anesthesia Physical Anesthesia Plan  ASA: III  Anesthesia Plan: General   Post-op Pain Management:    Induction: Intravenous  Airway Management Planned: Oral ETT  Additional Equipment:   Intra-op Plan:   Post-operative Plan: Extubation in OR  Informed Consent: I have reviewed the patients History and Physical, chart, labs and discussed the procedure including the risks, benefits and alternatives for the proposed anesthesia with the patient or authorized representative who has indicated his/her understanding and acceptance.   Dental Advisory Given  Plan Discussed with: CRNA and Surgeon  Anesthesia Plan Comments:         Anesthesia Quick Evaluation

## 2014-09-29 DIAGNOSIS — Z01818 Encounter for other preprocedural examination: Secondary | ICD-10-CM | POA: Diagnosis not present

## 2014-09-29 LAB — CBC WITH DIFFERENTIAL/PLATELET
Basophils Absolute: 0 10*3/uL (ref 0.0–0.1)
Basophils Relative: 0 % (ref 0–1)
EOS PCT: 0 % (ref 0–5)
Eosinophils Absolute: 0 10*3/uL (ref 0.0–0.7)
HEMATOCRIT: 33.9 % — AB (ref 36.0–46.0)
HEMOGLOBIN: 11 g/dL — AB (ref 12.0–15.0)
LYMPHS ABS: 0.9 10*3/uL (ref 0.7–4.0)
Lymphocytes Relative: 14 % (ref 12–46)
MCH: 30.9 pg (ref 26.0–34.0)
MCHC: 32.4 g/dL (ref 30.0–36.0)
MCV: 95.2 fL (ref 78.0–100.0)
Monocytes Absolute: 0.4 10*3/uL (ref 0.1–1.0)
Monocytes Relative: 7 % (ref 3–12)
NEUTROS PCT: 79 % — AB (ref 43–77)
Neutro Abs: 5.1 10*3/uL (ref 1.7–7.7)
Platelets: 245 10*3/uL (ref 150–400)
RBC: 3.56 MIL/uL — ABNORMAL LOW (ref 3.87–5.11)
RDW: 13.2 % (ref 11.5–15.5)
WBC: 6.5 10*3/uL (ref 4.0–10.5)

## 2014-09-29 LAB — BASIC METABOLIC PANEL
Anion gap: 8 (ref 5–15)
BUN: 21 mg/dL (ref 6–23)
CO2: 24 mmol/L (ref 19–32)
CREATININE: 1.24 mg/dL — AB (ref 0.50–1.10)
Calcium: 8.7 mg/dL (ref 8.4–10.5)
Chloride: 103 mmol/L (ref 96–112)
GFR calc Af Amer: 52 mL/min — ABNORMAL LOW (ref >90)
GFR calc non Af Amer: 45 mL/min — ABNORMAL LOW (ref >90)
GLUCOSE: 135 mg/dL — AB (ref 70–99)
Potassium: 4.2 mmol/L (ref 3.5–5.1)
Sodium: 135 mmol/L (ref 135–145)

## 2014-09-29 NOTE — Progress Notes (Signed)
Fentanyl PCA stopped. Syringe wasted in sink. 6mL verified and wasted with Nolon Lennert RN. Kizzie Ide, RN

## 2014-09-29 NOTE — Progress Notes (Signed)
UR completed 

## 2014-09-29 NOTE — Progress Notes (Signed)
Pt VSS. Pt IV removed. Reviewed D/C summary with patient, Pt has prescriptions in hand.  No further questions. Kizzie Ide, RN

## 2014-09-29 NOTE — Discharge Instructions (Signed)
Radiofrequency/Microwave Ablation of Liver Tumors, Care After Refer to this sheet in the next few weeks. These instructions provide you with information on caring for yourself after your procedure. Your health care provider may also give you more specific instructions. Your treatment has been planned according to current medical practices, but problems sometimes occur. Call your health care provider if you have any problems or questions after your procedure.  WHAT TO EXPECT AFTER THE PROCEDURE After your procedure, you may have pain and discomfort in the upper abdomen. You will be given pain medicines to control this.  HOME CARE INSTRUCTIONS  Take all medicines as directed by your health care provider.  Follow diet instructions as directed by your health care provider.  Follow instructions regarding rest and physical activity. SEEK MEDICAL CARE IF:  You cannot pass gas.   You cannot have a bowel movement within 2 days.   You have a skin rash.   You have a fever. SEEK IMMEDIATE MEDICAL CARE IF:  You have severe or lasting abdominal pain or pain in your shoulder or back.   You have trouble swallowing or breathing.   You have severe weakness or dizziness.   You have chest pain or shortness of breath. Document Released: 03/29/2013 Document Revised: 06/13/2013 Document Reviewed: 03/29/2013 [facility] Patient Information 2015 [place], [place]. This information is not intended to replace advice given to you by your health care provider. Make sure you discuss any questions you have with your health care provider.

## 2014-09-29 NOTE — Discharge Summary (Signed)
Patient ID: Margaret Elliott MRN: [id number] DOB/AGE: 1951-06-09 64 y.o.  Admit date: 09/28/2014 Discharge date: 09/29/2014  Admission Diagnoses: Colon cancer with solitary right hepatic lobe liver metastasis  Discharge Diagnoses: : Colon cancer with solitary right hepatic lobe liver metastasis, status post CT-guided percutaneous microwave ablation via general anesthesia on 09/28/14 Active Problems:   Colon cancer metastasized to liver   Metastatic colon cancer to liver  Past Medical History  Diagnosis Date  . Anemia   . Cancer     Colon   Past Surgical History  Procedure Laterality Date  . Abdominal hysterectomy    . Ganglion cyst excision    . Partial colectomy Right 12/09/2012    Procedure:  Right Hemi-colectomy, Resection of Distal Ileum;  Surgeon: Ralene Ok, MD;  Location: [place];  Service: General;  Laterality: Right;  . Appendectomy N/A 12/09/2012    Procedure: APPENDECTOMY;  Surgeon: Ralene Ok, MD;  Location: [place];  Service: General;  Laterality: N/A;  . Laparoscopy N/A 12/09/2012    Procedure: LAPAROSCOPY DIAGNOSTIC;  Surgeon: Ralene Ok, MD;  Location: [place];  Service: General;  Laterality: N/A;  . Portacath placement Left 01/04/2013    Procedure: INSERTION PORT-A-CATH;  Surgeon: Ralene Ok, MD;  Location: [place];  Service: General;  Laterality: Left;  . Tubal ligation    . Abdominal hysterectomy        Discharged Condition: good  Hospital Course: Margaret Elliott is a 64 y.o. female with past medical history significant only for anemia, who was initially diagnosed with colon cancer in June 2014, when she presented with abdominal pain and was found to have a cecal mass resulting in obstruction and appendicitis. At initial presentation, she was noted to have an indeterminate liver lesion which was worrisome for metastatic disease. The patient subsequently underwent a laparoscopic right hemicolectomy with ileal-colonic anastomosis on 12/09/2012 with  pathology demonstrating invasive, well-differentiated colorectal adenocarcinoma with tumor invading the muscularis propria and involvement of 2/18 sampled mesenteric lymph nodes with pathologic staging of T3, N1B disease. She completed 12 cycles with FOLFOX and Avastin with last chemotherapy administered on 06/23/2013. PET/CT scan performed on 04/03/14 revealed recurrent focus of hypermetabolic activity in the posterior right hepatic lobe, suspicious for liver metastasis. Subsequent MRI of the abdomen on 04/13/14 revealed right hepatic lobe lesion between segments 6 and 7 currently measuring 12 mm with imaging characteristics compatible with metastatic lesion. On 09/28/14 Margaret Elliott underwent CT-guided percutaneous microwave ablation of the solitary right hepatic lobe liver metastasis via general anesthesia. The procedure was performed without immediate complications and the patient was admitted for overnight observation. On the morning of discharge the patient did complain of some mild weakness and dizziness upon ambulation as well as mild nausea. She was given antiemetics , her PCA fentanyl was discontinued and an IV fluid bolus was given/IV fluid rate increased to 100 mL/hr secondary to some soft blood pressures. Following these measures the patient improved. The patient was able to void without difficulty and denied fevers/chills, significant abdominal or flank pain. Follow-up hemoglobin was 11.0 , WBC 6.5, creatinine slightly elevated at 1.24. She was seen by Dr. Pascal Lux and deemed stable for discharge at this time. Prescriptions were given for Vicodin 5/325, #20, no refills, patient to take 1-2 tablets every 4-6 hours as needed for pain as well as Phenergan 25 mg, #10, no refills, patient to take 1 tablet every 6 hours as needed for nausea. She will be scheduled for follow-up in the interventional  radiology clinic with Dr. Pascal Lux in approximately 4 weeks. An MRI of the abdomen will be obtained in approximately 3  months. She will continue follow up as scheduled with Dr. Alen Blew. She was encouraged to increase fluid intake upon discharge and call our service with any additional questions or concerns.  Consults: none  Significant Diagnostic Studies:  Results for orders placed or performed during the hospital encounter of 09/28/14  CBC with Differential/Platelet  Result Value Ref Range   WBC 6.5 4.0 - 10.5 K/uL   RBC 3.56 (L) 3.87 - 5.11 MIL/uL   Hemoglobin 11.0 (L) 12.0 - 15.0 g/dL   HCT 33.9 (L) 36.0 - 46.0 %   MCV 95.2 78.0 - 100.0 fL   MCH 30.9 26.0 - 34.0 pg   MCHC 32.4 30.0 - 36.0 g/dL   RDW 13.2 11.5 - 15.5 %   Platelets 245 150 - 400 K/uL   Neutrophils Relative % 79 (H) 43 - 77 %   Neutro Abs 5.1 1.7 - 7.7 K/uL   Lymphocytes Relative 14 12 - 46 %   Lymphs Abs 0.9 0.7 - 4.0 K/uL   Monocytes Relative 7 3 - 12 %   Monocytes Absolute 0.4 0.1 - 1.0 K/uL   Eosinophils Relative 0 0 - 5 %   Eosinophils Absolute 0.0 0.0 - 0.7 K/uL   Basophils Relative 0 0 - 1 %   Basophils Absolute 0.0 0.0 - 0.1 K/uL     Treatments: Dg Chest 1 View  09/28/2014   CLINICAL DATA:  Power Port catheter.  Preoperative chest x-ray.  EXAM: CHEST  1 VIEW  COMPARISON:  PET-CT 04/03/2014.  Chest x-ray 12/09/2012.  FINDINGS: Power port catheter noted with tip projected over the cavoatrial junction. Mediastinum and hilar structures normal. Lungs are clear. Heart size normal. Tiny stable left upper lobe pulmonary nodule. No pleural effusion or pneumothorax. No acute bony abnormality.  IMPRESSION: 1. Power port catheter in good anatomic position. 2. No acute cardiopulmonary disease.   Electronically Signed   By: Marcello Moores  Register   On: 09/28/2014 09:51   Ct Guide Tissue Ablation  09/28/2014   CLINICAL DATA:  History of colon cancer, now with hypermetabolic solitary hepatic metastasis. Please perform CT-guided percutaneous microwave ablation  EXAM: CT-GUIDED PERCUTANEOUS MICROWAVE ABLATION OF LIVER LESION.  COMPARISON:  PET-CT -  04/03/2014; abdominal MRI - 04/13/2014  ANESTHESIA/SEDATION: General  MEDICATIONS: Zosyn 3.375 g IV; The antibiotic was administered in an appropriate time interval prior to needle puncture of the skin.  CONTRAST:  A total of 150 cc Omnipaque 300 was administered intravenously  PROCEDURE: The procedure, risks, benefits, and alternatives were explained to the patient. Questions regarding the procedure were encouraged and answered. The patient understands and consents to the procedure.  The patient was placed under general anesthesia. Initial unenhanced CT was performed in a prone position to localize ill-defined hypo attenuating lesion within in the subcapsular aspect of the posterior segment of the right lobe of the liver (representative image 22, series 2). Intravenous contrast was then administered to better define the lesion which measured approximately 1.7 x 1.4 cm and maximal oblique axial diameter (image 31, series 35). The procedure was planned.  The patient was prepped with Betadine in a sterile fashion, and a sterile drape was applied covering the operative field. A sterile gown and sterile gloves were used for the procedure.  A 22 gauge spinal needle was advanced in expected trajectory of the microwave ablation probe for procedural planning. Once the appropriate trajectory  was established, a 15 cm length percutaneous microwave ablation probe was advanced into the hypo attenuating hepatic lesion under intermittent CT guidance. Once appropriate positioning was confirmed, the microwave ablation probe was frozen locked in place.  Next, with the use of a 22 gauge spinal needle, the left posterior perihepatic/perirenal space was accessed. Appropriate positioning was confirmed and hydrodissection was performed with saline and dilute contrast.  Final microwave ablation probe positioning was confirmed and filed by a 10 minute ablation at 65 watts with intra procedural imaging obtained at 3 and 8 minutes.  Tract  ablation was performed as the microwave ablation probe was removed. Hemostasis was achieved with manual compression. A limited postprocedural scan was performed. A dressing was placed.  FINDINGS: Suspected minimal increase in size of the now approximately 1.7 x 1.4 cm ill-defined hypo attenuating lesion within the subcapsular aspect of the posterior segment of the right lobe of the liver.  Under intermittent CT guidance, a percutaneous microwave ablation probe was inserted into the mass and a 10 minutes ablation was performed. Intra procedural imaging demonstrated appropriate formation of the ablation zone without evidence of complication.  Post ablation contrast enhanced imaging demonstrates an adequate ablation zone encompassing the ill-defined hypoattenuating hepatic lesion and was negative for obvious complication, specifically, no pneumothorax or significant hemorrhage about the ablation site.  IMPRESSION: Successful CT guided percutaneous microwave ablation of solitary ill-defined hypo attenuating lesion within the subcapsular aspect of the posterior segment of the right lobe of the liver. The patient will be observed overnight.  PLAN: - Patient will be seen in initial follow-up consultation at the interventional radiology clinic in approximately 4 weeks.  - Postprocedural surveillance abdominal MRI and will be obtained in approximately 3 months (12/2014).   Electronically Signed   By: Sandi Mariscal M.D.   On: 09/28/2014 15:46     Discharge Exam: Blood pressure 98/51, pulse 69, temperature 98.1 F (36.7 C), temperature source Oral, resp. rate 14, height 5\' 4"  (1.626 m), weight 171 lb (77.565 kg), SpO2 100 %. Patient is awake, alert and oriented. Chest is clear to auscultation bilaterally. Clean, intact left chest wall Port-A-Cath; Heart with regular rate and rhythm. Abdomen soft, positive bowel sounds, nontender. Puncture site right flank clean, dry, mildly tender to palpation, no  obvious hematoma;  extremities with full range of motion and no significant edema  Disposition: home  Discharge Instructions    Call MD for:  difficulty breathing, headache or visual disturbances    Complete by:  As directed      Call MD for:  extreme fatigue    Complete by:  As directed      Call MD for:  hives    Complete by:  As directed      Call MD for:  persistant dizziness or light-headedness    Complete by:  As directed      Call MD for:  persistant nausea and vomiting    Complete by:  As directed      Call MD for:  redness, tenderness, or signs of infection (pain, swelling, redness, odor or green/yellow discharge around incision site)    Complete by:  As directed      Call MD for:  severe uncontrolled pain    Complete by:  As directed      Call MD for:  temperature >100.4    Complete by:  As directed      Change dressing (specify)    Complete by:  As directed   May  change bandages over right flank daily and apply new Band-Aid to site for the next 2-3 days. May wash site with soap and water     Diet - low sodium heart healthy    Complete by:  As directed      Driving Restrictions    Complete by:  As directed   No driving for next 48 hours     Increase activity slowly    Complete by:  As directed      Lifting restrictions    Complete by:  As directed   No heavy lifting for next 2-3 days     May shower / Bathe    Complete by:  As directed      May walk up steps    Complete by:  As directed             Medication List    STOP taking these medications        amLODipine 5 MG tablet  Commonly known as:  NORVASC     diphenoxylate-atropine 2.5-0.025 MG per tablet  Commonly known as:  LOMOTIL      TAKE these medications        acetaminophen 500 MG tablet  Commonly known as:  TYLENOL  Take 500 mg by mouth every 6 (six) hours as needed for pain.     diphenhydrAMINE 25 mg capsule  Commonly known as:  BENADRYL  Take 25-50 mg by mouth daily as needed for itching. For allergic  reaction     lidocaine-prilocaine cream  Commonly known as:  EMLA  Apply 1 application topically as needed. Apply approx 1/2 tsp to skin over port, prior to chemotherapy treatments     multivitamin tablet  Take 1 tablet by mouth daily.           Follow-up Information    Follow up with Sandi Mariscal, MD.   Specialty:  Interventional Radiology   Why:  Radiology will call you with follow up appointment with Dr. Pascal Lux in the next 2-4 weeks; please call [phone] or [phone] with any questions or concerns   Contact information:   Home [address] [place] [address] [phone]       Follow up with [facility], MD.   Specialty:  Oncology   Why:  Continue current follow-up with Dr. Alen Blew as scheduled   Contact information:   [place] [place] [address] [phone]       I have spent less than 30 minutes coordinating discharge for Margaret Elliott.    SignedRowe Robert, [facility]        09/29/2014, 9:31 AM

## 2014-09-30 ENCOUNTER — Other Ambulatory Visit: Payer: Self-pay | Admitting: Radiology

## 2014-09-30 DIAGNOSIS — C189 Malignant neoplasm of colon, unspecified: Secondary | ICD-10-CM

## 2014-09-30 DIAGNOSIS — C787 Secondary malignant neoplasm of liver and intrahepatic bile duct: Principal | ICD-10-CM

## 2014-10-02 ENCOUNTER — Other Ambulatory Visit (HOSPITAL_COMMUNITY): Payer: Self-pay | Admitting: Interventional Radiology

## 2014-10-02 DIAGNOSIS — C787 Secondary malignant neoplasm of liver and intrahepatic bile duct: Principal | ICD-10-CM

## 2014-10-02 DIAGNOSIS — C189 Malignant neoplasm of colon, unspecified: Secondary | ICD-10-CM

## 2014-10-31 ENCOUNTER — Other Ambulatory Visit (HOSPITAL_COMMUNITY): Payer: Self-pay | Admitting: Interventional Radiology

## 2014-10-31 ENCOUNTER — Ambulatory Visit
Admission: RE | Admit: 2014-10-31 | Discharge: 2014-10-31 | Disposition: A | Discharge status: Home / Self Care | Payer: BLUE CROSS/BLUE SHIELD | Source: Ambulatory Visit | Attending: Interventional Radiology | Admitting: Interventional Radiology

## 2014-10-31 DIAGNOSIS — C189 Malignant neoplasm of colon, unspecified: Secondary | ICD-10-CM

## 2014-10-31 DIAGNOSIS — C787 Secondary malignant neoplasm of liver and intrahepatic bile duct: Principal | ICD-10-CM

## 2014-10-31 NOTE — Progress Notes (Signed)
Patient ID: Margaret Elliott, female   DOB: 08/20/1950, 64 y.o.   MRN: [id number]    Chief Complaint: Chief Complaint  Patient presents with  . Follow-up    1 mo follow up [facility]   Referring Physician(s): Drs. Byerly (hepatobiliary surgery) and Shadad (Oncology).  History of Present Illness: Margaret Elliott is a 64 y.o. female with past medical history significant for metastatic colon cancer who underwent a technically successful microwave ablation of solitary lesion within the posterior aspect the right lobe of the liver on 09/27/2016. The patient presents today to the interventional radiology clinic for postprocedural evaluation. She is unaccompanied and serves as her own historian.  In brief, the patient was initially diagnosed with an obstructive cecal mass complicated by appendicitis for which she such underwent a laparoscopic right-sided hemicolectomy with ileal colonic anastomosis on 12/09/2012. At the time of diagnosis, the patient was noted to have an indeterminate liver lesion worrisome for metastatic disease. As such, patient was treated with combination for FOLFOX and Avastin. Surveillance PET scan obtained 01/16/2013 demonstrated metabolic activity within this ill-defined lesion within the right lobe of the liver however surveillance PET scan obtained 09/12/2013 failed to delineate any metabolic activity compatible with treated disease. Unfortunately, subsequent PET scan obtained 04/03/2014 demonstrated recurrent metabolic activity within the ill-defined liver lesion with subsequent contrast enhanced abdominal MRI obtained on 04/13/2014 confirming findings worrisome for recurrent solitary metastatic disease. Note, patient's CEA level has never been substantially elevated with peak value of 1.7 on 03/13/2013. As such, the patient underwent a technically successful microwave ablation on 09/28/2014.  The patient has recovered completely from the percutaneous microwave ablation. Patient denies  any abdominal, back or flank pain. No fever or chills. No change in energy level. The patient has returned to all activities of daily living.  Past Medical History  Diagnosis Date  . Anemia   . Cancer     Colon    Past Surgical History  Procedure Laterality Date  . Abdominal hysterectomy    . Ganglion cyst excision    . Partial colectomy Right 12/09/2012    Procedure:  Right Hemi-colectomy, Resection of Distal Ileum;  Surgeon: Ralene Ok, MD;  Location: [place];  Service: General;  Laterality: Right;  . Appendectomy N/A 12/09/2012    Procedure: APPENDECTOMY;  Surgeon: Ralene Ok, MD;  Location: [place];  Service: General;  Laterality: N/A;  . Laparoscopy N/A 12/09/2012    Procedure: LAPAROSCOPY DIAGNOSTIC;  Surgeon: Ralene Ok, MD;  Location: [place];  Service: General;  Laterality: N/A;  . Portacath placement Left 01/04/2013    Procedure: INSERTION PORT-A-CATH;  Surgeon: Ralene Ok, MD;  Location: [place];  Service: General;  Laterality: Left;  . Tubal ligation    . Abdominal hysterectomy      Allergies: Review of patient's allergies indicates no known allergies.  Medications: Prior to Admission medications   Medication Sig Start Date End Date Taking? Authorizing Provider  acetaminophen (TYLENOL) 500 MG tablet Take 500 mg by mouth every 6 (six) hours as needed for pain.    Historical Provider, MD  diphenhydrAMINE (BENADRYL) 25 mg capsule Take 25-50 mg by mouth daily as needed for itching. For allergic reaction    Historical Provider, MD  lidocaine-prilocaine (EMLA) cream Apply 1 application topically as needed. Apply approx 1/2 tsp to skin over port, prior to chemotherapy treatments 01/11/13   Wyatt Portela, MD  Multiple Vitamin (MULTIVITAMIN) tablet Take 1 tablet by mouth daily.    Historical Provider, MD  Family History  Problem Relation Age of Onset  . Heart disease Mother   . COPD Mother     History   Social History  . Marital Status: Married    Spouse  Name: N/A  . Number of Children: 3  . Years of Education: N/A   Occupational History  . Sales Other   Social History Main Topics  . Smoking status: Former Smoker    Types: Cigarettes    Quit date: 06/22/1972  . Smokeless tobacco: Never Used  . Alcohol Use: Yes     Comment: wine a couple of times a week, occ  . Drug Use: No  . Sexual Activity: Not on file   Other Topics Concern  . Not on file   Social History Narrative    ECOG Status: 0 - Asymptomatic  Review of Systems: A 12 point ROS discussed and pertinent positives are indicated in the HPI above.  All other systems are negative.  Review of Systems  Constitutional: Negative.  Negative for fever, chills, activity change, fatigue and unexpected weight change.  Respiratory: Negative.   Cardiovascular: Negative.   Gastrointestinal: Negative for abdominal pain.  Skin: Negative.   Psychiatric/Behavioral: Negative.      Vital Signs: BP 117/76 mmHg  Pulse 86  Temp(Src) 98 F (36.7 C) (Oral)  Resp 14  SpO2 98%  Physical Exam  Constitutional: She appears well-developed and well-nourished.  HENT:  Head: Normocephalic and atraumatic.  Abdominal: There is no tenderness.  No CVA tenderness  Skin: Skin is warm and dry.  I am see to find the [place] access sites.    Imaging:  PET CT - 01/16/2013; 09/12/2013; 04/03/2014 7: Abdominal MRI - 04/13/2014; CT guided hepatic lesion microwave ablation - 10/18/2014.  Selected images from all of the above examinations were reviewed in detail with the patient.  In critical review of the post procedural contrast enhanced CT images from the percutaneous microwave performed 10/18/2014, I am concerned that there may be residual ill-defined enhancement cranial and posterior to the dominant ablation site.  While hopefully contained within the ablation field, this ill defined area is worrisome for residual disease and as such, I will proceed with obtaining a contrast enhanced abdominal  MRI.  Labs:  CBC:  Recent Labs  05/22/14 0810 09/13/14 0914 09/28/14 0955 09/29/14 0923  WBC 4.3 4.5 4.8 6.5  HGB 12.2 13.1 13.2 11.0*  HCT 37.6 39.9 40.0 33.9*  PLT 254 285 287 245    COAGS:  Recent Labs  09/21/14 0830  INR 0.96  APTT 29    BMP:  Recent Labs  05/22/14 0810 09/13/14 0915 09/28/14 0955 09/29/14 0923  NA 140 141 140 135  K 4.0 4.2 4.1 4.2  CL  --   --  106 103  CO2 25 24 24 24   GLUCOSE 89 80 94 135*  BUN 15.0 17.1 18 21   CALCIUM 9.2 9.4 9.6 8.7  CREATININE 0.9 1.0 1.04 1.24*  GFRNONAA  --   --  56* 45*  GFRAA  --   --  64* 52*    LIVER FUNCTION TESTS:  Recent Labs  04/03/14 0812 05/22/14 0810 09/13/14 0915 09/28/14 0955  BILITOT 0.41 0.46 0.44 0.7  AST 14 17 15 17   ALT 12 12 15 13   ALKPHOS 119 91 94 88  PROT 7.3 7.0 7.6 8.1  ALBUMIN 3.3* 3.4* 3.5 4.1    TUMOR MARKERS:  Recent Labs  01/03/14 0810 04/03/14 0812 08/09/14 0916 09/13/14 0915  CEA 0.7  0.6 1.0 1.0    Assessment and Plan:  Margaret Elliott is a 64 y.o. female with past medical history significant for metastatic colon cancer who underwent a technically successful microwave ablation of solitary lesion within the posterior aspect the right lobe of the liver on 09/27/2016. The patient presents today to the interventional radiology clinic for postprocedural evaluation.  The patient has recovered completely from the percutaneous microwave ablation.  In critical review of the post procedural contrast enhanced CT images from the percutaneous microwave performed 10/18/2014, I am concerned that there may be residual ill-defined enhancement cranial and posterior to the dominant ablation site.  While hopefully contained within the ablation field, this ill defined area is worrisome for residual disease and as such, I will proceed with obtaining a contrast enhanced abdominal MRI for further evaluation. Again, the patient's CEA level has never been substantially elevated and as such,  acquisition of a repeat level may be of limited utility.  I will see the patient again in consultation following the acquisition of this abdominal MRI.  SignedSandi Mariscal 10/31/2014, 8:45 AM   I spent a total of  25 Minutes in face to face in clinical consultation, greater than 50% of which was counseling/coordinating care for metastatic colon cancer, post microwave ablation.

## 2014-11-09 ENCOUNTER — Ambulatory Visit (HOSPITAL_COMMUNITY)
Admission: RE | Admit: 2014-11-09 | Discharge: 2014-11-09 | Disposition: A | Discharge status: Home / Self Care | Payer: BLUE CROSS/BLUE SHIELD | Source: Ambulatory Visit | Attending: Interventional Radiology | Admitting: Interventional Radiology

## 2014-11-09 DIAGNOSIS — C787 Secondary malignant neoplasm of liver and intrahepatic bile duct: Secondary | ICD-10-CM | POA: Insufficient documentation

## 2014-11-09 DIAGNOSIS — C189 Malignant neoplasm of colon, unspecified: Secondary | ICD-10-CM

## 2014-11-09 MED ORDER — GADOBENATE DIMEGLUMINE 529 MG/ML IV SOLN
15.0000 mL | Freq: Once | INTRAVENOUS | Status: AC | PRN
  Administered 2014-11-09: 15 mL via INTRAVENOUS

## 2014-11-13 ENCOUNTER — Ambulatory Visit (HOSPITAL_BASED_OUTPATIENT_CLINIC_OR_DEPARTMENT_OTHER): Payer: BLUE CROSS/BLUE SHIELD

## 2014-11-13 VITALS — BP 131/75 | HR 86 | Temp 98.4°F

## 2014-11-13 DIAGNOSIS — Z452 Encounter for adjustment and management of vascular access device: Secondary | ICD-10-CM | POA: Diagnosis not present

## 2014-11-13 DIAGNOSIS — Z95828 Presence of other vascular implants and grafts: Secondary | ICD-10-CM

## 2014-11-13 DIAGNOSIS — C180 Malignant neoplasm of cecum: Secondary | ICD-10-CM | POA: Diagnosis not present

## 2014-11-13 MED ORDER — HEPARIN SOD (PORK) LOCK FLUSH 100 UNIT/ML IV SOLN
500.0000 [IU] | Freq: Once | INTRAVENOUS | Status: AC
  Administered 2014-11-13: 500 [IU] via INTRAVENOUS
  Filled 2014-11-13: qty 5

## 2014-11-13 MED ORDER — SODIUM CHLORIDE 0.9 % IJ SOLN
10.0000 mL | INTRAMUSCULAR | Status: DC | PRN
  Administered 2014-11-13: 10 mL via INTRAVENOUS
  Filled 2014-11-13: qty 10

## 2014-11-13 NOTE — Patient Instructions (Signed)

## 2014-11-20 ENCOUNTER — Other Ambulatory Visit: Payer: BLUE CROSS/BLUE SHIELD

## 2014-11-22 ENCOUNTER — Other Ambulatory Visit: Payer: Self-pay | Admitting: Interventional Radiology

## 2014-11-22 DIAGNOSIS — C189 Malignant neoplasm of colon, unspecified: Secondary | ICD-10-CM

## 2014-11-22 DIAGNOSIS — C787 Secondary malignant neoplasm of liver and intrahepatic bile duct: Principal | ICD-10-CM

## 2014-11-23 NOTE — Progress Notes (Signed)
Called for orders surgery 11-30-14 pre op 11-28-14 Thanks

## 2014-11-28 ENCOUNTER — Encounter (HOSPITAL_COMMUNITY)
Admission: RE | Admit: 2014-11-28 | Discharge: 2014-11-28 | Disposition: A | Discharge status: Home / Self Care | Payer: BLUE CROSS/BLUE SHIELD | Source: Ambulatory Visit | Attending: Orthopedic Surgery | Admitting: Orthopedic Surgery

## 2014-11-28 ENCOUNTER — Encounter (HOSPITAL_COMMUNITY): Payer: Self-pay

## 2014-11-28 DIAGNOSIS — Z87891 Personal history of nicotine dependence: Secondary | ICD-10-CM | POA: Diagnosis not present

## 2014-11-28 DIAGNOSIS — Z79899 Other long term (current) drug therapy: Secondary | ICD-10-CM | POA: Diagnosis not present

## 2014-11-28 DIAGNOSIS — C189 Malignant neoplasm of colon, unspecified: Secondary | ICD-10-CM | POA: Diagnosis not present

## 2014-11-28 DIAGNOSIS — Z9221 Personal history of antineoplastic chemotherapy: Secondary | ICD-10-CM | POA: Diagnosis not present

## 2014-11-28 DIAGNOSIS — D649 Anemia, unspecified: Secondary | ICD-10-CM | POA: Diagnosis not present

## 2014-11-28 DIAGNOSIS — C787 Secondary malignant neoplasm of liver and intrahepatic bile duct: Secondary | ICD-10-CM | POA: Diagnosis present

## 2014-11-28 DIAGNOSIS — R319 Hematuria, unspecified: Secondary | ICD-10-CM | POA: Diagnosis not present

## 2014-11-28 HISTORY — DX: Personal history of antineoplastic chemotherapy: Z92.21

## 2014-11-28 LAB — COMPREHENSIVE METABOLIC PANEL
ALBUMIN: 3.6 g/dL (ref 3.5–5.0)
ALT: 13 U/L — ABNORMAL LOW (ref 14–54)
AST: 20 U/L (ref 15–41)
Alkaline Phosphatase: 87 U/L (ref 38–126)
Anion gap: 7 (ref 5–15)
BUN: 15 mg/dL (ref 6–20)
CO2: 27 mmol/L (ref 22–32)
Calcium: 9.4 mg/dL (ref 8.9–10.3)
Chloride: 106 mmol/L (ref 101–111)
Creatinine, Ser: 0.91 mg/dL (ref 0.44–1.00)
Glucose, Bld: 102 mg/dL — ABNORMAL HIGH (ref 65–99)
POTASSIUM: 4.3 mmol/L (ref 3.5–5.1)
Sodium: 140 mmol/L (ref 135–145)
TOTAL PROTEIN: 7.4 g/dL (ref 6.5–8.1)
Total Bilirubin: 0.5 mg/dL (ref 0.3–1.2)

## 2014-11-28 LAB — CBC
HCT: 38.5 % (ref 36.0–46.0)
Hemoglobin: 12.4 g/dL (ref 12.0–15.0)
MCH: 30 pg (ref 26.0–34.0)
MCHC: 32.2 g/dL (ref 30.0–36.0)
MCV: 93 fL (ref 78.0–100.0)
Platelets: 276 10*3/uL (ref 150–400)
RBC: 4.14 MIL/uL (ref 3.87–5.11)
RDW: 13.2 % (ref 11.5–15.5)
WBC: 4.5 10*3/uL (ref 4.0–10.5)

## 2014-11-28 NOTE — Progress Notes (Signed)
EKG 09/21/2014 in epic CXR in epic 09/28/2014

## 2014-11-28 NOTE — Progress Notes (Signed)
No orders in at time of PAT visit 11/28/2014. Please place orders in epic. Thanks.

## 2014-11-28 NOTE — Patient Instructions (Addendum)
Margaret Elliott  11/28/2014   Your procedure is scheduled on:  Friday November 30, 2014  Report to [facility] Main  Entrance and follow signs to               Radiology arrive at 6:30 AM.  Call this number if you have problems the morning of surgery [phone]   Remember: ONLY 1 PERSON MAY GO WITH YOU TO SHORT STAY TO GET  READY MORNING OF [place].  Do not eat food or drink liquids :After Midnight.     Take these medicines the morning of surgery with A SIP OF WATER: NONE                               You may not have any metal on your body including hair pins and              piercings  Do not wear jewelry, make-up, lotions, powders or perfumes, deodorant             Do not wear nail polish.  Do not shave  48 hours prior to surgery.               Do not bring valuables to the hospital. Person.  Contacts, dentures or bridgework may not be worn into surgery.  Leave suitcase in the car. After surgery it may be brought to your room.     _____________________________________________________________________             [facility] - Preparing for Surgery Before surgery, you can play an important role.  Because skin is not sterile, your skin needs to be as free of germs as possible.  You can reduce the number of germs on your skin by washing with CHG (chlorahexidine gluconate) soap before surgery.  CHG is an antiseptic cleaner which kills germs and bonds with the skin to continue killing germs even after washing. Please DO NOT use if you have an allergy to CHG or antibacterial soaps.  If your skin becomes reddened/irritated stop using the CHG and inform your nurse when you arrive at Short Stay. Do not shave (including legs and underarms) for at least 48 hours prior to the first CHG shower.  You may shave your face/neck. Please follow these instructions carefully:  1.  Shower with CHG Soap the night before  surgery and the  morning of Surgery.  2.  If you choose to wash your hair, wash your hair first as usual with your  normal  shampoo.  3.  After you shampoo, rinse your hair and body thoroughly to remove the  shampoo.                           4.  Use CHG as you would any other liquid soap.  You can apply chg directly  to the skin and wash                       Gently with a scrungie or clean washcloth.  5.  Apply the CHG Soap to your body ONLY FROM THE NECK DOWN.   Do not use on face/ open  Wound or open sores. Avoid contact with eyes, ears mouth and genitals (private parts).                       Wash face,  Genitals (private parts) with your normal soap.             6.  Wash thoroughly, paying special attention to the area where your surgery  will be performed.  7.  Thoroughly rinse your body with warm water from the neck down.  8.  DO NOT shower/wash with your normal soap after using and rinsing off  the CHG Soap.                9.  Pat yourself dry with a clean towel.            10.  Wear clean pajamas.            11.  Place clean sheets on your bed the night of your first shower and do not  sleep with pets. Day of Surgery : Do not apply any lotions/deodorants the morning of surgery.  Please wear clean clothes to the hospital/surgery center.  FAILURE TO FOLLOW THESE INSTRUCTIONS MAY RESULT IN THE CANCELLATION OF YOUR SURGERY PATIENT SIGNATURE_________________________________  NURSE SIGNATURE__________________________________  ________________________________________________________________________

## 2014-11-29 ENCOUNTER — Other Ambulatory Visit: Payer: Self-pay | Admitting: Radiology

## 2014-11-30 ENCOUNTER — Ambulatory Visit (HOSPITAL_COMMUNITY): Payer: BLUE CROSS/BLUE SHIELD | Admitting: Certified Registered"

## 2014-11-30 ENCOUNTER — Ambulatory Visit (HOSPITAL_COMMUNITY)
Admission: RE | Admit: 2014-11-30 | Discharge: 2014-11-30 | Disposition: A | Discharge status: Home / Self Care | Payer: BLUE CROSS/BLUE SHIELD | Source: Ambulatory Visit | Attending: Interventional Radiology | Admitting: Interventional Radiology

## 2014-11-30 ENCOUNTER — Encounter (HOSPITAL_COMMUNITY): Payer: Self-pay | Admitting: *Deleted

## 2014-11-30 ENCOUNTER — Observation Stay (HOSPITAL_COMMUNITY)
Admission: RE | Admit: 2014-11-30 | Discharge: 2014-12-01 | Disposition: A | Discharge status: Home / Self Care | Payer: BLUE CROSS/BLUE SHIELD | Source: Ambulatory Visit | Attending: Interventional Radiology | Admitting: Interventional Radiology

## 2014-11-30 ENCOUNTER — Encounter (HOSPITAL_COMMUNITY)
Admission: RE | Disposition: A | Discharge status: Home / Self Care | Payer: Self-pay | Source: Ambulatory Visit | Attending: Interventional Radiology

## 2014-11-30 ENCOUNTER — Encounter (HOSPITAL_COMMUNITY): Payer: Self-pay

## 2014-11-30 DIAGNOSIS — C189 Malignant neoplasm of colon, unspecified: Secondary | ICD-10-CM

## 2014-11-30 DIAGNOSIS — Z79899 Other long term (current) drug therapy: Secondary | ICD-10-CM | POA: Insufficient documentation

## 2014-11-30 DIAGNOSIS — C787 Secondary malignant neoplasm of liver and intrahepatic bile duct: Secondary | ICD-10-CM | POA: Diagnosis not present

## 2014-11-30 DIAGNOSIS — Z9221 Personal history of antineoplastic chemotherapy: Secondary | ICD-10-CM | POA: Insufficient documentation

## 2014-11-30 DIAGNOSIS — Z87891 Personal history of nicotine dependence: Secondary | ICD-10-CM | POA: Insufficient documentation

## 2014-11-30 DIAGNOSIS — D649 Anemia, unspecified: Secondary | ICD-10-CM | POA: Insufficient documentation

## 2014-11-30 DIAGNOSIS — R319 Hematuria, unspecified: Secondary | ICD-10-CM | POA: Insufficient documentation

## 2014-11-30 LAB — CBC WITH DIFFERENTIAL/PLATELET
Basophils Absolute: 0 10*3/uL (ref 0.0–0.1)
Basophils Relative: 0 % (ref 0–1)
EOS ABS: 0.2 10*3/uL (ref 0.0–0.7)
Eosinophils Relative: 3 % (ref 0–5)
HEMATOCRIT: 38.3 % (ref 36.0–46.0)
HEMOGLOBIN: 12.4 g/dL (ref 12.0–15.0)
LYMPHS ABS: 1.7 10*3/uL (ref 0.7–4.0)
LYMPHS PCT: 35 % (ref 12–46)
MCH: 29.8 pg (ref 26.0–34.0)
MCHC: 32.4 g/dL (ref 30.0–36.0)
MCV: 92.1 fL (ref 78.0–100.0)
MONO ABS: 0.4 10*3/uL (ref 0.1–1.0)
Monocytes Relative: 8 % (ref 3–12)
NEUTROS PCT: 54 % (ref 43–77)
Neutro Abs: 2.7 10*3/uL (ref 1.7–7.7)
Platelets: 261 10*3/uL (ref 150–400)
RBC: 4.16 MIL/uL (ref 3.87–5.11)
RDW: 13.3 % (ref 11.5–15.5)
WBC: 4.9 10*3/uL (ref 4.0–10.5)

## 2014-11-30 LAB — COMPREHENSIVE METABOLIC PANEL
ALBUMIN: 3.9 g/dL (ref 3.5–5.0)
ALK PHOS: 101 U/L (ref 38–126)
ALT: 16 U/L (ref 14–54)
AST: 22 U/L (ref 15–41)
Anion gap: 7 (ref 5–15)
BILIRUBIN TOTAL: 0.6 mg/dL (ref 0.3–1.2)
BUN: 14 mg/dL (ref 6–20)
CALCIUM: 9.4 mg/dL (ref 8.9–10.3)
CHLORIDE: 108 mmol/L (ref 101–111)
CO2: 24 mmol/L (ref 22–32)
Creatinine, Ser: 0.9 mg/dL (ref 0.44–1.00)
GFR calc Af Amer: >60 mL/min (ref >60)
GFR calc non Af Amer: >60 mL/min (ref >60)
Glucose, Bld: 86 mg/dL (ref 65–99)
POTASSIUM: 4 mmol/L (ref 3.5–5.1)
Sodium: 139 mmol/L (ref 135–145)
Total Protein: 8 g/dL (ref 6.5–8.1)

## 2014-11-30 LAB — TYPE AND SCREEN
ABO/RH(D): O POS
Antibody Screen: NEGATIVE

## 2014-11-30 LAB — URINE MICROSCOPIC-ADD ON

## 2014-11-30 LAB — URINALYSIS, ROUTINE W REFLEX MICROSCOPIC
Glucose, UA: NEGATIVE mg/dL
Ketones, ur: 15 mg/dL — AB
Nitrite: POSITIVE — AB
PH: 6 (ref 5.0–8.0)
Protein, ur: 100 mg/dL — AB
Specific Gravity, Urine: >1.046 — ABNORMAL HIGH (ref 1.005–1.030)
UROBILINOGEN UA: 0.2 mg/dL (ref 0.0–1.0)

## 2014-11-30 LAB — PROTIME-INR
INR: 0.97 (ref 0.00–1.49)
Prothrombin Time: 13 seconds (ref 11.6–15.2)

## 2014-11-30 LAB — APTT: APTT: 29 s (ref 24–37)

## 2014-11-30 SURGERY — RADIO FREQUENCY ABLATION
Anesthesia: General

## 2014-11-30 MED ORDER — NALOXONE HCL 0.4 MG/ML IJ SOLN: 0.4000 mg | INTRAMUSCULAR | Status: DC | PRN

## 2014-11-30 MED ORDER — ONDANSETRON HCL 4 MG/2ML IJ SOLN: 4.0000 mg | Freq: Four times a day (QID) | INTRAMUSCULAR | Status: DC | PRN

## 2014-11-30 MED ORDER — EPHEDRINE SULFATE 50 MG/ML IJ SOLN
INTRAMUSCULAR | Status: DC | PRN
  Administered 2014-11-30: 5 mg via INTRAVENOUS

## 2014-11-30 MED ORDER — MIDAZOLAM HCL 5 MG/5ML IJ SOLN
INTRAMUSCULAR | Status: DC | PRN
  Administered 2014-11-30: 2 mg via INTRAVENOUS

## 2014-11-30 MED ORDER — SENNOSIDES-DOCUSATE SODIUM 8.6-50 MG PO TABS
1.0000 | ORAL_TABLET | Freq: Every day | ORAL | Status: DC | PRN
  Filled 2014-11-30: qty 1

## 2014-11-30 MED ORDER — PROMETHAZINE HCL 25 MG/ML IJ SOLN: 6.2500 mg | INTRAMUSCULAR | Status: DC | PRN

## 2014-11-30 MED ORDER — FENTANYL CITRATE (PF) 100 MCG/2ML IJ SOLN
INTRAMUSCULAR | Status: AC
  Filled 2014-11-30: qty 2

## 2014-11-30 MED ORDER — DIPHENHYDRAMINE HCL 12.5 MG/5ML PO ELIX
12.5000 mg | ORAL_SOLUTION | Freq: Four times a day (QID) | ORAL | Status: DC | PRN
  Filled 2014-11-30: qty 5

## 2014-11-30 MED ORDER — MIDAZOLAM HCL 2 MG/2ML IJ SOLN
INTRAMUSCULAR | Status: AC
  Filled 2014-11-30: qty 2

## 2014-11-30 MED ORDER — HYDROMORPHONE HCL 2 MG/ML IJ SOLN
INTRAMUSCULAR | Status: AC
  Filled 2014-11-30: qty 1

## 2014-11-30 MED ORDER — DOCUSATE SODIUM 100 MG PO CAPS
100.0000 mg | ORAL_CAPSULE | Freq: Two times a day (BID) | ORAL | Status: DC
  Administered 2014-11-30 – 2014-12-01 (×2): 100 mg via ORAL
  Filled 2014-11-30 (×3): qty 1

## 2014-11-30 MED ORDER — LACTATED RINGERS IV SOLN
INTRAVENOUS | Status: DC
  Administered 2014-11-30 (×2): via INTRAVENOUS

## 2014-11-30 MED ORDER — IOHEXOL 300 MG/ML  SOLN
100.0000 mL | Freq: Once | INTRAMUSCULAR | Status: AC | PRN
  Administered 2014-11-30: 75 mL via INTRAVENOUS

## 2014-11-30 MED ORDER — HYDROCODONE-ACETAMINOPHEN 5-325 MG PO TABS: 1.0000 | ORAL_TABLET | ORAL | Status: DC | PRN

## 2014-11-30 MED ORDER — LIDOCAINE HCL (CARDIAC) 20 MG/ML IV SOLN
INTRAVENOUS | Status: DC | PRN
  Administered 2014-11-30: 50 mg via INTRAVENOUS

## 2014-11-30 MED ORDER — ROCURONIUM BROMIDE 100 MG/10ML IV SOLN
INTRAVENOUS | Status: DC | PRN
  Administered 2014-11-30: 5 mg via INTRAVENOUS
  Administered 2014-11-30 (×3): 10 mg via INTRAVENOUS
  Administered 2014-11-30: 25 mg via INTRAVENOUS

## 2014-11-30 MED ORDER — IOHEXOL 350 MG/ML SOLN
100.0000 mL | Freq: Once | INTRAVENOUS | Status: AC | PRN
  Administered 2014-11-30: 75 mL via INTRAVENOUS

## 2014-11-30 MED ORDER — LACTATED RINGERS IV SOLN
INTRAVENOUS | Status: DC
  Administered 2014-11-30 – 2014-12-01 (×3): via INTRAVENOUS

## 2014-11-30 MED ORDER — FENTANYL 10 MCG/ML IV SOLN
INTRAVENOUS | Status: DC
  Administered 2014-11-30: 90 ug via INTRAVENOUS
  Administered 2014-11-30: 75 ug/h via INTRAVENOUS
  Administered 2014-11-30: 13:00:00 via INTRAVENOUS
  Administered 2014-12-01: 173 ug via INTRAVENOUS
  Administered 2014-12-01: 255 ug via INTRAVENOUS
  Filled 2014-11-30: qty 50

## 2014-11-30 MED ORDER — GLYCOPYRROLATE 0.2 MG/ML IJ SOLN
INTRAMUSCULAR | Status: DC | PRN
  Administered 2014-11-30: .8 mg via INTRAVENOUS

## 2014-11-30 MED ORDER — PIPERACILLIN-TAZOBACTAM 3.375 G IVPB
3.3750 g | Freq: Once | INTRAVENOUS | Status: AC
  Administered 2014-11-30: 3.375 g via INTRAVENOUS
  Filled 2014-11-30 (×2): qty 50

## 2014-11-30 MED ORDER — SODIUM CHLORIDE 0.9 % IJ SOLN: 9.0000 mL | INTRAMUSCULAR | Status: DC | PRN

## 2014-11-30 MED ORDER — DEXAMETHASONE SODIUM PHOSPHATE 10 MG/ML IJ SOLN
INTRAMUSCULAR | Status: DC | PRN
  Administered 2014-11-30: 10 mg via INTRAVENOUS

## 2014-11-30 MED ORDER — FENTANYL CITRATE (PF) 250 MCG/5ML IJ SOLN
INTRAMUSCULAR | Status: AC
  Filled 2014-11-30: qty 5

## 2014-11-30 MED ORDER — FENTANYL CITRATE (PF) 100 MCG/2ML IJ SOLN
INTRAMUSCULAR | Status: DC | PRN
  Administered 2014-11-30: 100 ug via INTRAVENOUS
  Administered 2014-11-30 (×5): 50 ug via INTRAVENOUS

## 2014-11-30 MED ORDER — HYDROCODONE-ACETAMINOPHEN 7.5-325 MG PO TABS: 1.0000 | ORAL_TABLET | Freq: Once | ORAL | Status: DC | PRN

## 2014-11-30 MED ORDER — SUCCINYLCHOLINE CHLORIDE 20 MG/ML IJ SOLN
INTRAMUSCULAR | Status: DC | PRN
  Administered 2014-11-30: 100 mg via INTRAVENOUS

## 2014-11-30 MED ORDER — HYDROMORPHONE HCL 1 MG/ML IJ SOLN: 0.2500 mg | INTRAMUSCULAR | Status: DC | PRN

## 2014-11-30 MED ORDER — SENNOSIDES-DOCUSATE SODIUM 8.6-50 MG PO TABS: 1.0000 | ORAL_TABLET | Freq: Every day | ORAL | Status: DC | PRN

## 2014-11-30 MED ORDER — DOCUSATE SODIUM 100 MG PO CAPS
100.0000 mg | ORAL_CAPSULE | Freq: Two times a day (BID) | ORAL | Status: DC
  Filled 2014-11-30 (×3): qty 1

## 2014-11-30 MED ORDER — NEOSTIGMINE METHYLSULFATE 10 MG/10ML IV SOLN
INTRAVENOUS | Status: DC | PRN
  Administered 2014-11-30: 4 mg via INTRAVENOUS

## 2014-11-30 MED ORDER — DIPHENHYDRAMINE HCL 50 MG/ML IJ SOLN: 12.5000 mg | Freq: Four times a day (QID) | INTRAMUSCULAR | Status: DC | PRN

## 2014-11-30 MED ORDER — PROPOFOL 10 MG/ML IV BOLUS
INTRAVENOUS | Status: DC | PRN
  Administered 2014-11-30: 160 mg via INTRAVENOUS

## 2014-11-30 MED ORDER — HYDROCODONE-ACETAMINOPHEN 5-325 MG PO TABS
1.0000 | ORAL_TABLET | ORAL | Status: DC | PRN
  Administered 2014-12-01 (×3): 2 via ORAL
  Filled 2014-11-30 (×3): qty 2

## 2014-11-30 MED ORDER — ONDANSETRON HCL 4 MG/2ML IJ SOLN
INTRAMUSCULAR | Status: DC | PRN
  Administered 2014-11-30: 4 mg via INTRAVENOUS

## 2014-11-30 NOTE — Procedures (Signed)
Technically successful MWA of metastatic lesion to the posterior segment of the right lobe of the liver. No immediate post procedural complications.

## 2014-11-30 NOTE — Progress Notes (Addendum)
Day of Surgery  Subjective: Patient without acute complaints except blood tinged urine. Denies abdominal or flank pain, nausea or vomiting  Objective: Vital signs in last 24 hours: Temp:  [97.6 F (36.4 C)-98.4 F (36.9 C)] 98.4 F (36.9 C) (06/10 1544) Pulse Rate:  [51-88] 61 (06/10 1544) Resp:  [9-16] 13 (06/10 1544) BP: (148-173)/(74-87) 169/74 mmHg (06/10 1544) SpO2:  [97 %-100 %] 100 % (06/10 1544) Weight:  [175 lb 0.7 oz (79.4 kg)] 175 lb 0.7 oz (79.4 kg) (06/10 1400) Last BM Date: 11/30/14  Intake/Output from previous day:   Intake/Output this shift: Total I/O In: 1970 [I.V.:1970] Out: 880 [Urine:880]  Puncture site right upper  abdominal region clean, dry, nontender; Foley with blood tinged urine  Lab Results:   Recent Labs  11/28/14 0900 11/30/14 0710  WBC 4.5 4.9  HGB 12.4 12.4  HCT 38.5 38.3  PLT 276 261   BMET  Recent Labs  11/28/14 0900 11/30/14 0710  NA 140 139  K 4.3 4.0  CL 106 108  CO2 27 24  GLUCOSE 102* 86  BUN 15 14  CREATININE 0.91 0.90  CALCIUM 9.4 9.4   PT/INR  Recent Labs  11/30/14 0710  LABPROT 13.0  INR 0.97   ABG No results for input(s): PHART, HCO3 in the last 72 hours.  Invalid input(s): PCO2, PO2  Studies/Results: Ct Guide Tissue Ablation  11/30/2014   CLINICAL DATA:  History of colon cancer, now with known hypermetabolic solitary hepatic metastasis. Patient underwent a prior percutaneous CT-guided percutaneous microwave ablation on 09/28/2014 however postprocedural MRI performed 11/09/2014 demonstrated nodular enhancement about the cranial aspect of the ablation cavity worrisome for residual / recurrent disease. As such, patient returns today in the interventional radiology department for repeat CT-guided microwave ablation.  EXAM: CT-GUIDED PERCUTANEOUS THERMAL ABLATION OF LIVER LESION.  COMPARISON:  CT-guided percutaneous microwave ablation - 09/28/2014; abdominal MRI - 11/09/2014; 04/13/2014  ANESTHESIA/SEDATION:  General  MEDICATIONS: Ancef 1 gram IV. The antibiotic was administered in an appropriate time interval prior to needle puncture of the skin.  CONTRAST:  200 mL OMNIPAQUE IOHEXOL 300 MG/ML  SOLN  PROCEDURE: The procedure, risks, benefits, and alternatives were explained to the patient. Questions regarding the procedure were encouraged and answered. The patient understands and consents to the procedure.  The patient was placed under general anesthesia. Initial unenhanced CT was performed in a supine, slightly RAO position to localize prior ablation site as well as the known ill-defined solitary hepatic lesion. The procedure was planned.  The patient was prepped with Betadine in a sterile fashion, and a sterile drape was applied covering the operative field. A sterile gown and sterile gloves were used for the procedure.  A 22 gauge spinal needle was advanced in expected trajectory of the microwave ablation probe for procedural planning. At this point, a contrast-enhanced abdominal CT was obtained to better define the residual hepatic lesion.  With the ill-defined lesion while better visualized, a 15 cm length PR neuwave MWA percutaneous microwave ablation probe was advanced immediately cranial to the ill-defined hypo attenuating hepatic lesion under intermittent CT guidance. Next an additional 15 cm PR neuwave MWA percutaneous microwave ablation probe was advanced about the more caudal aspect of the ill-defined hypoattenuating hepatic lesion.  With the ablation globes adequately positioned, a 10 minute burn was performed at 65W with intra procedural imaging performed at 5 minutes. Following the 10 minute burn cycle, the microwave ablation probes were removed with tract ablation.  A repeat contrast-enhanced abdominal CT was  performed however demonstrated a suboptimal result with concern for residual ill-defined hypo attenuating tumor.  As such, the 15 cm PR neuwave MWA percutaneous microwave ablation probes were  inserted directed towards the area concerning for residual. Appropriate positioning was performed with intermittent CT guidance. With the ablation probes adequately position, an 8 minute burn cycle was performed at 65W with intra procedural imaging performed at 5 minutes. The microwave ablation probes were removed with tract ablation.  A completion contrast abdominal CT was performed and the procedure was terminated. Hemostasis was achieved with manual compression. Dressings were applied. The patient tolerated the procedure well without immediate postprocedural complication.  FINDINGS: Initial postcontrast imaging demonstrates gain approximately 2.0 x 1.5 cm hypo attenuating lesion (image 16, series 8) immediately cranial to the prior ablation site correlating with the residual area of enhancement seen on preceding abdominal MRI.  Under intermittent CT guidance, two 15 cm PR neuwave MWA percutaneous microwave ablation probes were inserted into the expected location of the residual ill-defined hypo attenuating mass. Admittedly, the hepatic lesion was very suboptimally visualized without intravenous contrast and with the percutaneous micro wave ablation globe was manipulated into place secondary to be associated adjacent streak artifact.  Initially, a 10 minutes burn cycle was performed at 65W, however the initial postprocedural contrast enhanced exam demonstrated findings are worrisome for residual tumor (image 28, series 21).  As such, the two 15 cm PR neuwave MWA percutaneous microwave ablation probes were inserted into the residual ill-defined hypo attenuating mass and an additional 8 minute burn cycle was performed.  Post ablation imaging demonstrates an adequate ablation zone and was negative for obvious complication, specifically, no pneumothorax or significant hemorrhage about the ablation site.  IMPRESSION: Successful CT guided percutaneous thermal ablation of residual solitary hepatic metastasis. The patient  will be observed overnight.  PLAN: - Patient will be seen an initial follow-up consultation at the interventional radiology clinic in approximately 4 weeks.  - Post procedural abdominal MRI will be obtained in approximately 3 months (03/02/2015).   Electronically Signed   By: Sandi Mariscal M.D.   On: 11/30/2014 15:46    Anti-infectives: Anti-infectives    Start     Dose/Rate Route Frequency Ordered Stop   11/30/14 0730  piperacillin-tazobactam (ZOSYN) IVPB 3.375 g     3.375 g 12.5 mL/hr over 240 Minutes Intravenous  Once 11/30/14 1761 11/30/14 0850      Assessment/Plan: s/p CT guided thermal ablation of residual right hepatic lobe metastasis from colon cancer; hematuria postprocedure;  for overnight observation; check a.m. labs; check UA- monitor for hemoglobinuria/hemolysis ; follow-up with Dr. Pascal Lux in [place] clinic in one month     D. Rowe Robert 11/30/2014

## 2014-11-30 NOTE — Anesthesia Procedure Notes (Signed)
Procedure Name: Intubation Date/Time: 11/30/2014 8:48 AM Performed by: Noralyn Pick D Pre-anesthesia Checklist: Patient identified, Emergency Drugs available, Suction available and Patient being monitored Patient Re-evaluated:Patient Re-evaluated prior to inductionOxygen Delivery Method: Circle System Utilized Preoxygenation: Pre-oxygenation with 100% oxygen Intubation Type: IV induction Ventilation: Mask ventilation without difficulty Grade View: Grade II Tube type: Oral Tube size: 7.5 mm Number of attempts: 1 Airway Equipment and Method: Stylet and Oral airway Placement Confirmation: ETT inserted through vocal cords under direct vision,  positive ETCO2 and breath sounds checked- equal and bilateral Secured at: 21 cm Tube secured with: Tape Dental Injury: Teeth and Oropharynx as per pre-operative assessment

## 2014-11-30 NOTE — Anesthesia Preprocedure Evaluation (Signed)
Anesthesia Evaluation  Patient identified by MRN, date of birth, ID band Patient awake    Reviewed: Allergy & Precautions, H&P , NPO status , Patient's Chart, lab work & pertinent test results  Airway Mallampati: II  TM Distance: >3 FB Neck ROM: full    Dental no notable dental hx. (+) Teeth Intact, Dental Advisory Given   Pulmonary neg pulmonary ROS, former smoker,  breath sounds clear to auscultation  Pulmonary exam normal       Cardiovascular Exercise Tolerance: Good negative cardio ROS Normal cardiovascular examRhythm:regular Rate:Normal     Neuro/Psych negative neurological ROS  negative psych ROS   GI/Hepatic Liver mets Colon cancer   Endo/Other  negative endocrine ROS  Renal/GU negative Renal ROS  negative genitourinary   Musculoskeletal   Abdominal   Peds  Hematology negative hematology ROS (+)   Anesthesia Other Findings   Reproductive/Obstetrics negative OB ROS                             Anesthesia Physical  Anesthesia Plan  ASA: III  Anesthesia Plan: General   Post-op Pain Management:    Induction: Intravenous  Airway Management Planned: Oral ETT  Additional Equipment:   Intra-op Plan:   Post-operative Plan: Extubation in OR  Informed Consent: I have reviewed the patients History and Physical, chart, labs and discussed the procedure including the risks, benefits and alternatives for the proposed anesthesia with the patient or authorized representative who has indicated his/her understanding and acceptance.   Dental Advisory Given  Plan Discussed with: CRNA and Surgeon  Anesthesia Plan Comments:         Anesthesia Quick Evaluation

## 2014-11-30 NOTE — Anesthesia Postprocedure Evaluation (Signed)
  Anesthesia Post-op Note  Patient: Margaret Elliott  Procedure(s) Performed: Procedure(s): THERMAL ABLATION OF  LIVER (N/A)  Patient Location: PACU  Anesthesia Type:General  Level of Consciousness: awake, alert  and oriented  Airway and Oxygen Therapy: Patient Spontanous Breathing  Post-op Pain: mild  Post-op Assessment: Post-op Vital signs reviewed              Post-op Vital Signs: Reviewed  Last Vitals:  Filed Vitals:   11/30/14 1544  BP:   Pulse:   Temp:   Resp: 13    Complications: No apparent anesthesia complications

## 2014-11-30 NOTE — H&P (Signed)
Chief Complaint: Metastatic colon cancer to liver Referring Physician(s): Byerly/Shadad  History of Present Illness: Margaret Elliott is a 64 y.o. female with history of metastatic colon carcinoma who is status post CT-guided percutaneous microwave ablation of a solitary ill-defined lesion within the subcapsular aspect of the posterior segment of the right lobe of the liver on 09/28/14. Follow-up MRI of the abdomen on 11/09/14 revealed a 2.1 x 1.4 x 1.0 cm region of low grade enhancement extending cephalad from the ablation site, concerning for local tumor recurrence. The patient presents today following recent evaluation by IR for repeat CT-guided microwave ablation of the local tumor recurrence.  Past Medical History  Diagnosis Date  . Anemia   . Cancer     Colon  . History of chemotherapy     Past Surgical History  Procedure Laterality Date  . Abdominal hysterectomy    . Ganglion cyst excision    . Partial colectomy Right 12/09/2012    Procedure:  Right Hemi-colectomy, Resection of Distal Ileum;  Surgeon: Ralene Ok, MD;  Location: [place];  Service: General;  Laterality: Right;  . Appendectomy N/A 12/09/2012    Procedure: APPENDECTOMY;  Surgeon: Ralene Ok, MD;  Location: [place];  Service: General;  Laterality: N/A;  . Laparoscopy N/A 12/09/2012    Procedure: LAPAROSCOPY DIAGNOSTIC;  Surgeon: Ralene Ok, MD;  Location: [place];  Service: General;  Laterality: N/A;  . Portacath placement Left 01/04/2013    Procedure: INSERTION PORT-A-CATH;  Surgeon: Ralene Ok, MD;  Location: [place];  Service: General;  Laterality: Left;  . Tubal ligation    . Abdominal hysterectomy    . Ablation of liver       09/2014    Allergies: Review of patient's allergies indicates no known allergies.  Medications: Prior to Admission medications   Medication Sig Start Date End Date Taking? Authorizing Provider  acetaminophen (TYLENOL) 500 MG tablet Take 500 mg by mouth every 6 (six) hours  as needed for pain.   Yes Historical Provider, MD  diphenhydrAMINE (BENADRYL) 25 mg capsule Take 25-50 mg by mouth daily as needed for itching. For allergic reaction   Yes Historical Provider, MD  lidocaine-prilocaine (EMLA) cream Apply 1 application topically as needed. Apply approx 1/2 tsp to skin over port, prior to chemotherapy treatments 01/11/13  Yes Wyatt Portela, MD  Multiple Vitamin (MULTIVITAMIN) tablet Take 1 tablet by mouth every morning.    Yes Historical Provider, MD    Family History  Problem Relation Age of Onset  . Heart disease Mother   . COPD Mother     History   Social History  . Marital Status: Married    Spouse Name: N/A  . Number of Children: 3  . Years of Education: N/A   Occupational History  . Sales Other   Social History Main Topics  . Smoking status: Former Smoker -- 0.25 packs/day for 4 years    Types: Cigarettes    Quit date: 06/22/1972  . Smokeless tobacco: Never Used  . Alcohol Use: Yes     Comment: wine a couple of times a week, occ  . Drug Use: No  . Sexual Activity: Not on file   Other Topics Concern  . None   Social History Narrative      Review of Systems  Constitutional: Negative for fever and chills.  Respiratory: Negative for cough and shortness of breath.   Cardiovascular: Negative for chest pain.  Gastrointestinal: Negative for nausea, vomiting, abdominal pain and  blood in stool.  Genitourinary: Negative for dysuria and hematuria.  Musculoskeletal: Negative for back pain.  Neurological: Negative for headaches.    Vital Signs: BP 157/87 mmHg  Pulse 88  Temp(Src) 98 F (36.7 C) (Oral)  Resp 16  SpO2 97%  Physical Exam  Constitutional: She is oriented to person, place, and time. She appears well-developed and well-nourished.  Cardiovascular: Normal rate and regular rhythm.   Pulmonary/Chest: Effort normal and breath sounds normal.  Abdominal: Soft. Bowel sounds are normal. There is no tenderness.  Musculoskeletal:  Normal range of motion. She exhibits no edema.  Neurological: She is alert and oriented to person, place, and time.    Imaging: Mr Abdomen W Wo Contrast  11/09/2014   CLINICAL DATA:  Microwave ablation of right posterior hepatic lobe metastatic lesion on 09/27/2016. Colon cancer.  EXAM: MRI ABDOMEN WITHOUT AND WITH CONTRAST  TECHNIQUE: Multiplanar multisequence MR imaging of the abdomen was performed both before and after the administration of intravenous contrast.  CONTRAST:  19mL MULTIHANCE GADOBENATE DIMEGLUMINE 529 MG/ML IV SOLN  COMPARISON:  Multiple exams, including 09/28/2014  FINDINGS: Lower chest:  Unremarkable  Hepatobiliary: Ablation zone observed corresponding to prior medial position and hypodense ablation region on the 09/28/2014 CT. This is in the vicinity of the original tumor.  There is some irregular extension of mild enhancement but generalized hypodensity with respect to the hepatic parenchyma extending cephalad to the ablation zone and measuring 2.1 by 1.4 by 1.0 cm. I am suspicious that this may represent tumor extending cephalad to the ablation zone.  No new mass is identified.  Pancreas: Unremarkable  Spleen: Unremarkable  Adrenals/Urinary Tract: Right mid kidney Bosniak category 1 cyst, 2.0 cm in long axis.  Stomach/Bowel: Unremarkable  Vascular/Lymphatic: Unremarkable  Other: No supplemental non-categorized findings.  Musculoskeletal: Degenerative disc disease and endplate sclerosis at D4-Y8. Suspected facet arthropathy at L4-5.  IMPRESSION: 1. 2.1 by 1.4 by 1.0 cm region of low-grade enhancement extending cephalad from the ablation site, concerning for local tumor recurrence.   Electronically Signed   By: Van Clines M.D.   On: 11/09/2014 18:37    Labs:  CBC:  Recent Labs  09/28/14 0955 09/29/14 0923 11/28/14 0900 11/30/14 0710  WBC 4.8 6.5 4.5 4.9  HGB 13.2 11.0* 12.4 12.4  HCT 40.0 33.9* 38.5 38.3  PLT 287 245 276 261    COAGS:  Recent Labs   09/21/14 0830 11/30/14 0710  INR 0.96 0.97  APTT 29 29    BMP:  Recent Labs  09/28/14 0955 09/29/14 0923 11/28/14 0900 11/30/14 0710  NA 140 135 140 139  K 4.1 4.2 4.3 4.0  CL 106 103 106 108  CO2 24 24 27 24   GLUCOSE 94 135* 102* 86  BUN 18 21 15 14   CALCIUM 9.6 8.7 9.4 9.4  CREATININE 1.04 1.24* 0.91 0.90  GFRNONAA 56* 45* >60 >60  GFRAA 64* 52* >60 >60    LIVER FUNCTION TESTS:  Recent Labs  09/13/14 0915 09/28/14 0955 11/28/14 0900 11/30/14 0710  BILITOT 0.44 0.7 0.5 0.6  AST 15 17 20 22   ALT 15 13 13* 16  ALKPHOS 94 88 87 101  PROT 7.6 8.1 7.4 8.0  ALBUMIN 3.5 4.1 3.6 3.9    TUMOR MARKERS:  Recent Labs  01/03/14 0810 04/03/14 0812 08/09/14 0916 09/13/14 0915  CEA 0.7 0.6 1.0 1.0    Assessment and Plan: Margaret Elliott is a 64 y.o. female with history of metastatic colon carcinoma who is status  post CT-guided percutaneous microwave ablation of a solitary ill-defined lesion within the subcapsular aspect of the posterior segment of the right lobe of the liver on 09/28/14. Follow-up MRI of the abdomen on 11/09/14 revealed a 2.1 x 1.4 x 1.0 cm region of low grade enhancement extending cephalad from the ablation site, concerning for local tumor recurrence. The patient presents today following recent evaluation by IR for repeat CT-guided microwave ablation of the local tumor recurrence. Details/risks of procedure, including but not limited to, internal bleeding, infection, organ injury, anesthesia-related complications, discussed with patient and husband with their understanding and consent. Following the procedure the patient will be admitted for overnight observation.     Signed: D. Rowe Robert 11/30/2014, 8:19 AM   I spent a total of 30 minutes face to face in clinical consultation, greater than 50% of which was counseling/coordinating care for CT-guided microwave ablation of liver lesion(metastatic colon cancer)

## 2014-11-30 NOTE — Transfer of Care (Signed)
Immediate Anesthesia Transfer of Care Note  Patient: Margaret Elliott  Procedure(s) Performed: Procedure(s): THERMAL ABLATION OF  LIVER (N/A)  Patient Location: PACU  Anesthesia Type:General  Level of Consciousness: awake, alert  and oriented  Airway & Oxygen Therapy: Patient Spontanous Breathing and Patient connected to face mask oxygen  Post-op Assessment: Report given to RN and Post -op Vital signs reviewed and stable  Post vital signs: Reviewed and stable  Last Vitals:  Filed Vitals:   11/30/14 0639  BP: 157/87  Pulse: 88  Temp: 36.7 C  Resp: 16    Complications: No apparent anesthesia complications

## 2014-12-01 ENCOUNTER — Other Ambulatory Visit: Payer: Self-pay | Admitting: Radiology

## 2014-12-01 DIAGNOSIS — C189 Malignant neoplasm of colon, unspecified: Secondary | ICD-10-CM

## 2014-12-01 DIAGNOSIS — C787 Secondary malignant neoplasm of liver and intrahepatic bile duct: Secondary | ICD-10-CM | POA: Diagnosis not present

## 2014-12-01 DIAGNOSIS — K769 Liver disease, unspecified: Secondary | ICD-10-CM

## 2014-12-01 LAB — CBC
HCT: 33.8 % — ABNORMAL LOW (ref 36.0–46.0)
HEMOGLOBIN: 11.3 g/dL — AB (ref 12.0–15.0)
MCH: 31.5 pg (ref 26.0–34.0)
MCHC: 33.4 g/dL (ref 30.0–36.0)
MCV: 94.2 fL (ref 78.0–100.0)
Platelets: 216 10*3/uL (ref 150–400)
RBC: 3.59 MIL/uL — AB (ref 3.87–5.11)
RDW: 13.5 % (ref 11.5–15.5)
WBC: 7.3 10*3/uL (ref 4.0–10.5)

## 2014-12-01 LAB — COMPREHENSIVE METABOLIC PANEL
ALBUMIN: 3.2 g/dL — AB (ref 3.5–5.0)
ALT: 190 U/L — AB (ref 14–54)
ANION GAP: 7 (ref 5–15)
AST: 379 U/L — AB (ref 15–41)
Alkaline Phosphatase: 76 U/L (ref 38–126)
BILIRUBIN TOTAL: 1.7 mg/dL — AB (ref 0.3–1.2)
BUN: 15 mg/dL (ref 6–20)
CO2: 25 mmol/L (ref 22–32)
Calcium: 9 mg/dL (ref 8.9–10.3)
Chloride: 107 mmol/L (ref 101–111)
Creatinine, Ser: 0.99 mg/dL (ref 0.44–1.00)
GFR calc Af Amer: >60 mL/min (ref >60)
GFR calc non Af Amer: 59 mL/min — ABNORMAL LOW (ref >60)
Glucose, Bld: 121 mg/dL — ABNORMAL HIGH (ref 65–99)
Potassium: 4.2 mmol/L (ref 3.5–5.1)
Sodium: 139 mmol/L (ref 135–145)
TOTAL PROTEIN: 6.6 g/dL (ref 6.5–8.1)

## 2014-12-01 MED ORDER — HYDROCODONE-ACETAMINOPHEN 5-325 MG PO TABS: 1.0000 | ORAL_TABLET | ORAL | Status: DC | PRN

## 2014-12-01 NOTE — Progress Notes (Signed)
Pt preparing for DC. DC'd fentanyl PCA with 6 cc left in syringe to waste. Wasted in sink of med room in presence of Althea Charon, Therapist, sports.

## 2014-12-01 NOTE — Discharge Instructions (Signed)
Radiofrequency Ablation of Liver Tumors, Care After Refer to this sheet in the next few weeks. These instructions provide you with information on caring for yourself after your procedure. Your health care provider may also give you more specific instructions. Your treatment has been planned according to current medical practices, but problems sometimes occur. Call your health care provider if you have any problems or questions after your procedure.  WHAT TO EXPECT AFTER THE PROCEDURE After your procedure, you may have pain and discomfort in the upper abdomen. You will be given pain medicines to control this.  HOME CARE INSTRUCTIONS  Take all medicines as directed by your health care provider.  Follow diet instructions as directed by your health care provider.  Follow instructions regarding rest and physical activity. SEEK MEDICAL CARE IF:  You cannot pass gas.   You cannot have a bowel movement within 2 days.   You have a skin rash.   You have a fever. SEEK IMMEDIATE MEDICAL CARE IF:  You have severe or lasting abdominal pain or pain in your shoulder or back.   You have trouble swallowing or breathing.   You have severe weakness or dizziness.   You have chest pain or shortness of breath. Document Released: 03/29/2013 Document Revised: 06/13/2013 Document Reviewed: 03/29/2013 [facility] Patient Information 2015 [place], [place]. This information is not intended to replace advice given to you by your health care provider. Make sure you discuss any questions you have with your health care provider.

## 2014-12-01 NOTE — Discharge Summary (Signed)
Physician Discharge Summary  Patient ID: Margaret Elliott MRN: [id number] DOB/AGE: [birth year] 65 y.o.  Admit date: 11/30/2014 Discharge date: 12/01/2014  Admission Diagnoses: Active Problems:   Colon cancer metastasized to liver  Discharge Diagnoses:  Active Problems:   Colon cancer metastasized to liver    Procedures: Procedure(s): THERMAL ABLATION OF  LIVER LESION  Discharged Condition: good  Hospital Course: 64 yo female with metastatic liver lesion from colon cancer. Underwent repeat Microwave ablation of this lesion successfully on 6/10. Developed some post procedure hematuria, related to foley placement. Urine has cleared at this point. No post procedure issues aside from expected pain. Has tolerated regular diet. Pain controlled with po meds. Labs reviewed, slight bump in LFTs as expected. Stable for discharge.   Consults: None   Discharge Exam: Blood pressure 124/61, pulse 63, temperature 98 F (36.7 C), temperature source Oral, resp. rate 16, height 5\' 4"  (1.626 m), weight 175 lb 0.7 oz (79.4 kg), SpO2 99 %. Lungs: CTA without w/r/r Heart: Regular Abdomen: soft, NT. Perc sites clean, no thermal burn or erythema  CBC    Component Value Date/Time   WBC 7.3 12/01/2014 0448   RBC 3.59* 12/01/2014 0448   HGB 11.3* 12/01/2014 0448   HCT 33.8* 12/01/2014 0448   PLT 216 12/01/2014 0448   Hepatic Function Panel     Component Value Date/Time   PROT 6.6 12/01/2014 0448   ALBUMIN 3.2* 12/01/2014 0448   AST 379* 12/01/2014 0448   ALT 190* 12/01/2014 0448   ALKPHOS 76 12/01/2014 0448   BILITOT 1.7* 12/01/2014 0448   BILIDIR 0.0 11/29/2012 1003      Disposition: 01-Home or Self Care      Discharge Instructions    Call MD for:  difficulty breathing, headache or visual disturbances    Complete by:  As directed      Call MD for:  persistant nausea and vomiting    Complete by:  As directed      Call MD for:  redness, tenderness, or signs of infection  (pain, swelling, redness, odor or green/yellow discharge around incision site)    Complete by:  As directed      Call MD for:  severe uncontrolled pain    Complete by:  As directed      Call MD for:  temperature >100.4    Complete by:  As directed      Diet - low sodium heart healthy    Complete by:  As directed      Increase activity slowly    Complete by:  As directed      May shower / Bathe    Complete by:  As directed      May walk up steps    Complete by:  As directed      No wound care    Complete by:  As directed             Medication List    TAKE these medications        acetaminophen 500 MG tablet  Commonly known as:  TYLENOL  Take 500 mg by mouth every 6 (six) hours as needed for pain.     diphenhydrAMINE 25 mg capsule  Commonly known as:  BENADRYL  Take 25-50 mg by mouth daily as needed for itching. For allergic reaction     HYDROcodone-acetaminophen 5-325 MG per tablet  Commonly known as:  NORCO/VICODIN  Take 1-2 tablets by mouth every 4 (four) hours as needed for  moderate pain.     lidocaine-prilocaine cream  Commonly known as:  EMLA  Apply 1 application topically as needed. Apply approx 1/2 tsp to skin over port, prior to chemotherapy treatments     multivitamin tablet  Take 1 tablet by mouth every morning.       Follow-up Information    Follow up with Sandi Mariscal, MD In 1 month.   Specialty:  Interventional Radiology   Why:  Post procedure eval. Office will call with appointment date and time   Contact information:   [address] [place] [address] [phone]       Signed: Ascencion Dike PA-C 12/01/2014, 8:46 AM

## 2014-12-02 LAB — URINE CULTURE
Colony Count: NO GROWTH
Culture: NO GROWTH

## 2014-12-27 ENCOUNTER — Other Ambulatory Visit (HOSPITAL_COMMUNITY): Payer: Self-pay | Admitting: Interventional Radiology

## 2014-12-27 DIAGNOSIS — C787 Secondary malignant neoplasm of liver and intrahepatic bile duct: Secondary | ICD-10-CM

## 2014-12-27 DIAGNOSIS — C189 Malignant neoplasm of colon, unspecified: Secondary | ICD-10-CM

## 2014-12-27 DIAGNOSIS — K769 Liver disease, unspecified: Secondary | ICD-10-CM

## 2015-01-02 ENCOUNTER — Encounter: Payer: Self-pay | Admitting: *Deleted

## 2015-01-15 ENCOUNTER — Encounter (HOSPITAL_COMMUNITY): Payer: Self-pay

## 2015-01-15 ENCOUNTER — Telehealth: Payer: Self-pay | Admitting: Oncology

## 2015-01-15 ENCOUNTER — Other Ambulatory Visit (HOSPITAL_COMMUNITY)
Admission: RE | Admit: 2015-01-15 | Discharge: 2015-01-15 | Disposition: A | Discharge status: Home / Self Care | Payer: BLUE CROSS/BLUE SHIELD | Source: Ambulatory Visit | Attending: Oncology | Admitting: Oncology

## 2015-01-15 ENCOUNTER — Ambulatory Visit (HOSPITAL_BASED_OUTPATIENT_CLINIC_OR_DEPARTMENT_OTHER): Payer: BLUE CROSS/BLUE SHIELD | Admitting: Oncology

## 2015-01-15 ENCOUNTER — Other Ambulatory Visit (HOSPITAL_BASED_OUTPATIENT_CLINIC_OR_DEPARTMENT_OTHER): Payer: BLUE CROSS/BLUE SHIELD

## 2015-01-15 ENCOUNTER — Ambulatory Visit (HOSPITAL_BASED_OUTPATIENT_CLINIC_OR_DEPARTMENT_OTHER): Payer: BLUE CROSS/BLUE SHIELD

## 2015-01-15 VITALS — Wt 169.8 lb

## 2015-01-15 VITALS — BP 112/76 | HR 100 | Temp 97.7°F | Wt 169.5 lb

## 2015-01-15 DIAGNOSIS — C189 Malignant neoplasm of colon, unspecified: Secondary | ICD-10-CM | POA: Diagnosis present

## 2015-01-15 DIAGNOSIS — C180 Malignant neoplasm of cecum: Secondary | ICD-10-CM

## 2015-01-15 DIAGNOSIS — I10 Essential (primary) hypertension: Secondary | ICD-10-CM | POA: Diagnosis not present

## 2015-01-15 DIAGNOSIS — G609 Hereditary and idiopathic neuropathy, unspecified: Secondary | ICD-10-CM | POA: Diagnosis not present

## 2015-01-15 DIAGNOSIS — C787 Secondary malignant neoplasm of liver and intrahepatic bile duct: Secondary | ICD-10-CM | POA: Diagnosis not present

## 2015-01-15 DIAGNOSIS — Z95828 Presence of other vascular implants and grafts: Secondary | ICD-10-CM

## 2015-01-15 LAB — COMPREHENSIVE METABOLIC PANEL (CC13)
ALT: 12 U/L (ref 0–55)
AST: 17 U/L (ref 5–34)
Albumin: 3.4 g/dL — ABNORMAL LOW (ref 3.5–5.0)
Alkaline Phosphatase: 97 U/L (ref 40–150)
Anion Gap: 7 mEq/L (ref 3–11)
BUN: 18.4 mg/dL (ref 7.0–26.0)
CO2: 24 mEq/L (ref 22–29)
CREATININE: 1 mg/dL (ref 0.6–1.1)
Calcium: 9.6 mg/dL (ref 8.4–10.4)
Chloride: 109 mEq/L (ref 98–109)
EGFR: 68 mL/min/{1.73_m2} — AB (ref >90)
Glucose: 91 mg/dl (ref 70–140)
POTASSIUM: 4 meq/L (ref 3.5–5.1)
Sodium: 140 mEq/L (ref 136–145)
Total Bilirubin: 0.64 mg/dL (ref 0.20–1.20)
Total Protein: 7.7 g/dL (ref 6.4–8.3)

## 2015-01-15 LAB — CBC WITH DIFFERENTIAL/PLATELET
BASO%: 0.2 % (ref 0.0–2.0)
Basophils Absolute: 0 10*3/uL (ref 0.0–0.1)
EOS ABS: 0.1 10*3/uL (ref 0.0–0.5)
EOS%: 2.7 % (ref 0.0–7.0)
HCT: 37.2 % (ref 34.8–46.6)
HEMOGLOBIN: 12.5 g/dL (ref 11.6–15.9)
LYMPH#: 1.5 10*3/uL (ref 0.9–3.3)
LYMPH%: 37.1 % (ref 14.0–49.7)
MCH: 31.5 pg (ref 25.1–34.0)
MCHC: 33.6 g/dL (ref 31.5–36.0)
MCV: 93.7 fL (ref 79.5–101.0)
MONO#: 0.3 10*3/uL (ref 0.1–0.9)
MONO%: 6.7 % (ref 0.0–14.0)
NEUT%: 53.3 % (ref 38.4–76.8)
NEUTROS ABS: 2.2 10*3/uL (ref 1.5–6.5)
Platelets: 275 10*3/uL (ref 145–400)
RBC: 3.97 10*6/uL (ref 3.70–5.45)
RDW: 13.4 % (ref 11.2–14.5)
WBC: 4.2 10*3/uL (ref 3.9–10.3)

## 2015-01-15 LAB — CEA: CEA: 1 ng/mL (ref 0.0–5.0)

## 2015-01-15 MED ORDER — HEPARIN SOD (PORK) LOCK FLUSH 100 UNIT/ML IV SOLN
500.0000 [IU] | Freq: Once | INTRAVENOUS | Status: AC
  Administered 2015-01-15: 500 [IU] via INTRAVENOUS
  Filled 2015-01-15: qty 5

## 2015-01-15 MED ORDER — SODIUM CHLORIDE 0.9 % IJ SOLN
10.0000 mL | INTRAMUSCULAR | Status: DC | PRN
  Administered 2015-01-15: 10 mL via INTRAVENOUS
  Filled 2015-01-15: qty 10

## 2015-01-15 NOTE — Patient Instructions (Signed)

## 2015-01-15 NOTE — Progress Notes (Signed)
Hematology and Oncology Follow Up Visit  Margaret Elliott [id number] 07-29-1950     Principle Diagnosis: This is a 64 year old woman diagnosed with colon cancer in June 2014.oman diagnosed with colon cancer in June 2014. She had presented with an abdominal pain and found to have a cecal mass resulting in obstruction and advanced appendicitis. She had stage IV disease with a liver mass at that time.    Prior Therapy:  She is status post laparoscopic laparotomy, evacuation of a pelvic abscess and right hemicolectomy with ileocolonic anastomosis done on December 09, 2012. The pathology showed an invasive, well- differentiated colorectal adenocarcinoma. Tumor invades through the muscularis propria, with 2/18 lymph nodes involved, with the pathological staging T3 N1b disease. Her tumor was found to be microsatellite stable. KRAS mutation was not done. She is also status post Port-A-Cath insertion that was done on 01/05/2013. FOLFOX chemotherapy started 01/18/2013. Avastin to be added with cycle 2 on 02/01/2013. She is S/P 12 cycles completed in 05/2013 with CR.  She developed recurrence of her right hepatic metastasis documented October 2015. She is status post liver directed therapy on 2 separate occasions in April 2016 as well as June 2016.  Current therapy: Observation and surveillance.  Interim History:  Margaret Elliott presents today for a followup visit. Since her last visit, she underwent liver directed therapy by interventional radiology on 2 separate occasions. In April 2016 she had CT-guided the tissue ablation utilizing microwave. Follow-up MRI on 11/09/2014 showed local recurrence beyond the area of the microwave ablation. On 11/30/2014 she underwent further CT-guided percutaneous thermal ablation of the liver lesion. She tolerated the procedure well but did have few complications including hematuria and slight hypoxemia related to fentanyl. She recovered well from this procedure and reports no complaints.   She has not reported any change in her  bowel habits. Has not reported any hematochezia or melena. Has not reported any fatigue or early satiety. She is able to ambulate, drive and work full time without any decline. Does not report any nausea. Does not report any vomiting. Does not report any pelvic pain. Does not report any bony pain. Her performance status and activity level remains excellent. Her appetite have been also excellent and she has gained weight. She has not reported any headaches, blurry vision, syncope or seizures. She does not report any chest pain, palpitation or orthopnea. She does not report any bony pain or fractures. She does not report lymphadenopathy or petechiae. Rest of her review of systems unremarkable.   Medications: I have reviewed the patient's current medications.  Current Outpatient Prescriptions  Medication Sig Dispense Refill  . acetaminophen (TYLENOL) 500 MG tablet Take 500 mg by mouth every 6 (six) hours as needed for pain.      . diphenhydrAMINE (BENADRYL) 25 mg capsule Take 25-50 mg by mouth daily as needed for itching. For allergic reaction      . lidocaine-prilocaine (EMLA) cream Apply 1 application topically as needed. Apply approx 1/2 tsp to skin over port, prior to chemotherapy treatments  1 kit  3  . Multiple Vitamin (MULTIVITAMIN) tablet Take 1 tablet by mouth daily.      . ondansetron (ZOFRAN) 8 MG tablet Take 1 tablet (8 mg total) by mouth every 8 (eight) hours as needed for nausea.  30 tablet  1   No current facility-administered medications for this visit.     Allergies: No Known Allergies  Past Medical History, Surgical history, Social history, and Family History were reviewed and updated.    Physical Exam:  Blood  pressure 119/75, pulse 98, temperature 97.7 F (36.5 C), temperature source Oral, resp. rate 18, height $RemoveBe'5\' 4"'NiODChkon$  (1.626 m), weight 152 lb (68.947 kg). ECOG: 0 General appearance: alert awake. Appeared in no distress. Head: Normocephalic, without obvious  abnormality. Neck: no adenopathy.  Lymph nodes: Cervical, supraclavicular, and axillary nodes normal. Heart:regular rate and rhythm, S1, S2 normal, no murmur, click, rub or gallop Lung:chest clear, no wheezing, rales, normal symmetric air entry Abdomen: soft, non-tender, without masses or organomegaly EXT:no erythema, induration, or nodules Neurological: No deficits noted. Gait and mood are stable.   CBC    Component Value Date/Time   WBC 4.2 01/15/2015 0815   WBC 7.3 12/01/2014 0448   WBC 8.6 11/10/2011 1329   RBC 3.97 01/15/2015 0815   RBC 3.59* 12/01/2014 0448   RBC 3.95* 11/10/2011 1329   HGB 12.5 01/15/2015 0815   HGB 11.3* 12/01/2014 0448   HGB 11.3* 11/10/2011 1329   HCT 37.2 01/15/2015 0815   HCT 33.8* 12/01/2014 0448   HCT 35.8* 11/10/2011 1329   PLT 275 01/15/2015 0815   PLT 216 12/01/2014 0448   MCV 93.7 01/15/2015 0815   MCV 94.2 12/01/2014 0448   MCV 90.7 11/10/2011 1329   MCH 31.5 01/15/2015 0815   MCH 31.5 12/01/2014 0448   MCH 28.6 11/10/2011 1329   MCHC 33.6 01/15/2015 0815   MCHC 33.4 12/01/2014 0448   MCHC 31.6* 11/10/2011 1329   RDW 13.4 01/15/2015 0815   RDW 13.5 12/01/2014 0448   LYMPHSABS 1.5 01/15/2015 0815   LYMPHSABS 1.7 11/30/2014 0710   MONOABS 0.3 01/15/2015 0815   MONOABS 0.4 11/30/2014 0710   EOSABS 0.1 01/15/2015 0815   EOSABS 0.2 11/30/2014 0710   BASOSABS 0.0 01/15/2015 0815   BASOSABS 0.0 11/30/2014 0710    Impression and Plan:  64 year old woman with the following issues:   1. Advanced Colon cancer. She presented with a large right cecal mass causing obstruction and abscess and appendicitis. She is status post 12 cycles of chemotherapy with PET/CT scan on 09/12/2013  showed no residual disease.   PET/CT scan on 04/03/2014 showed recurrent activity in the posterior right hepatic lobe. MRI on 04/13/2014 showed that there is a concern of the right lobe of the liver, a possible mass measuring 12 mm.   She is status post microwave  ablation of the liver lesions completed in June 2016. She did have an earlier treatment in April 2016 as well.  The plan at this point is to repeat imaging studies including an MRI in September 2016. Depending on these results the options would be to continue observation surveillance, further liver directed therapy, surgical resection of any metastasis in the liver, and systemic chemotherapy.  The logistics of administration further chemotherapy was discussed. I have also requested K-ras testing of her tumor to further delineate treatment options. The preference is to treat her locally if she does develop local disease. I like to defer systemic therapy upon systemic relapse.  She will follow-up after her imaging studies in September 2016.  2. Intravenous access. PAC in place without complications. We will flush every 8 weeks.  3. Colonoscopy screening: She has not had a colonoscopy since her diagnosis of colon cancer. I discussed with her the importance of repeating colonoscopy as soon as possible. She would like to use a different gastroenterology group for personal reasons and I will make the referral today to [facility] gastroenterology.  4. Neuropathy: This is improving slowly after the completion of systemic chemotherapy.  5.  Hypertension: Blood pressure is normal at this time. this was exacerbated by systemic chemotherapy.   6. Followup: Will be in 2 months and sooner if needed to.    [facility] MD 01/15/2015

## 2015-01-15 NOTE — Telephone Encounter (Signed)
Gave and printed appt sched and avs for pt for Sept....the patient will contact [facility] and have all records sent to [facility]

## 2015-01-21 ENCOUNTER — Encounter (HOSPITAL_COMMUNITY): Payer: Self-pay

## 2015-01-28 ENCOUNTER — Other Ambulatory Visit: Payer: Self-pay | Admitting: *Deleted

## 2015-01-28 DIAGNOSIS — C787 Secondary malignant neoplasm of liver and intrahepatic bile duct: Principal | ICD-10-CM

## 2015-01-28 DIAGNOSIS — C189 Malignant neoplasm of colon, unspecified: Secondary | ICD-10-CM

## 2015-02-12 DIAGNOSIS — C787 Secondary malignant neoplasm of liver and intrahepatic bile duct: Secondary | ICD-10-CM | POA: Diagnosis not present

## 2015-02-12 DIAGNOSIS — C189 Malignant neoplasm of colon, unspecified: Secondary | ICD-10-CM | POA: Diagnosis not present

## 2015-02-12 LAB — COMPLETE METABOLIC PANEL WITH GFR
ALT: 12 U/L (ref 6–29)
AST: 18 U/L (ref 10–35)
Albumin: 3.8 g/dL (ref 3.6–5.1)
Alkaline Phosphatase: 93 U/L (ref 33–130)
BUN: 17 mg/dL (ref 7–25)
CHLORIDE: 105 mmol/L (ref 98–110)
CO2: 26 mmol/L (ref 20–31)
Calcium: 9.3 mg/dL (ref 8.6–10.4)
Creat: 1.04 mg/dL — ABNORMAL HIGH (ref 0.50–0.99)
GFR, Est African American: 65 mL/min (ref >=60)
GFR, Est Non African American: 57 mL/min — ABNORMAL LOW (ref >=60)
GLUCOSE: 108 mg/dL — AB (ref 65–99)
POTASSIUM: 4.1 mmol/L (ref 3.5–5.3)
SODIUM: 143 mmol/L (ref 135–146)
Total Bilirubin: 0.5 mg/dL (ref 0.2–1.2)
Total Protein: 7.1 g/dL (ref 6.1–8.1)

## 2015-02-20 ENCOUNTER — Ambulatory Visit
Admission: RE | Admit: 2015-02-20 | Discharge: 2015-02-20 | Disposition: A | Discharge status: Home / Self Care | Payer: Medicare Other | Source: Ambulatory Visit | Attending: Radiology | Admitting: Radiology

## 2015-02-20 ENCOUNTER — Ambulatory Visit (HOSPITAL_COMMUNITY)
Admission: RE | Admit: 2015-02-20 | Discharge: 2015-02-20 | Disposition: A | Discharge status: Home / Self Care | Payer: Medicare Other | Source: Ambulatory Visit | Attending: Interventional Radiology | Admitting: Interventional Radiology

## 2015-02-20 DIAGNOSIS — K7689 Other specified diseases of liver: Secondary | ICD-10-CM | POA: Diagnosis not present

## 2015-02-20 DIAGNOSIS — C189 Malignant neoplasm of colon, unspecified: Secondary | ICD-10-CM | POA: Insufficient documentation

## 2015-02-20 DIAGNOSIS — Z9889 Other specified postprocedural states: Secondary | ICD-10-CM | POA: Diagnosis not present

## 2015-02-20 DIAGNOSIS — C787 Secondary malignant neoplasm of liver and intrahepatic bile duct: Secondary | ICD-10-CM | POA: Insufficient documentation

## 2015-02-20 DIAGNOSIS — K769 Liver disease, unspecified: Secondary | ICD-10-CM

## 2015-02-20 DIAGNOSIS — Z08 Encounter for follow-up examination after completed treatment for malignant neoplasm: Secondary | ICD-10-CM | POA: Insufficient documentation

## 2015-02-20 MED ORDER — GADOBENATE DIMEGLUMINE 529 MG/ML IV SOLN
15.0000 mL | Freq: Once | INTRAVENOUS | Status: AC | PRN
  Administered 2015-02-20: 15 mL via INTRAVENOUS

## 2015-02-20 NOTE — Progress Notes (Signed)
Referring Physician(s): Dr. Alen Blew  Chief Complaint: The patient is seen in follow up today s/p successful CT guided percutaneous thermal ablation of residual solitary hepatic metastasis on 11/30/2014  History of present illness: Margaret Elliott is a 64 year old female with history of colon cancer with known hypermetabolic solitary hepatic metastasis. Patient underwent a prior percutaneous CT-guided percutaneous microwave ablation on 09/28/2014 however postprocedural MRI performed 11/09/2014 demonstrated nodular enhancement about the cranial aspect of the ablation cavity worrisome for residual / recurrent disease. The patient has subsequently underwent successful CT guided percutaneous thermal ablation of residual solitary hepatic metastasis on 11/30/2014. She presents today for follow up from her recent procedure and to discuss recent MRI imaging performed today. She denies any abdominal pain, procedure site skin changes or nausea today. She states she has been doing well post procedure without complaints and has recently been seen by Dr. Alen Blew on 01/15/15.    Past Medical History  Diagnosis Date  . Anemia   . Cancer     Colon  . History of chemotherapy     Past Surgical History  Procedure Laterality Date  . Abdominal hysterectomy    . Ganglion cyst excision    . Partial colectomy Right 12/09/2012    Procedure:  Right Hemi-colectomy, Resection of Distal Ileum;  Surgeon: Ralene Ok, MD;  Location: [place];  Service: General;  Laterality: Right;  . Appendectomy N/A 12/09/2012    Procedure: APPENDECTOMY;  Surgeon: Ralene Ok, MD;  Location: [place];  Service: General;  Laterality: N/A;  . Laparoscopy N/A 12/09/2012    Procedure: LAPAROSCOPY DIAGNOSTIC;  Surgeon: Ralene Ok, MD;  Location: [place];  Service: General;  Laterality: N/A;  . Portacath placement Left 01/04/2013    Procedure: INSERTION PORT-A-CATH;  Surgeon: Ralene Ok, MD;  Location: [place];  Service: General;   Laterality: Left;  . Tubal ligation    . Abdominal hysterectomy    . Ablation of liver       09/2014    Allergies: Review of patient's allergies indicates no known allergies.  Medications: Prior to Admission medications   Medication Sig Start Date End Date Taking? Authorizing Provider  acetaminophen (TYLENOL) 500 MG tablet Take 500 mg by mouth every 6 (six) hours as needed for pain.   Yes Historical Provider, MD  diphenhydrAMINE (BENADRYL) 25 mg capsule Take 25-50 mg by mouth daily as needed for itching. For allergic reaction   Yes Historical Provider, MD  lidocaine-prilocaine (EMLA) cream Apply 1 application topically as needed. Apply approx 1/2 tsp to skin over port, prior to chemotherapy treatments 01/11/13  Yes Wyatt Portela, MD  Multiple Vitamin (MULTIVITAMIN) tablet Take 1 tablet by mouth every morning.    Yes Historical Provider, MD  HYDROcodone-acetaminophen (NORCO/VICODIN) 5-325 MG per tablet Take 1-2 tablets by mouth every 4 (four) hours as needed for moderate pain. Patient not taking: Reported on 02/20/2015 12/01/14   Ascencion Dike, PA-C     Family History  Problem Relation Age of Onset  . Heart disease Mother   . COPD Mother     Social History   Social History  . Marital Status: Married    Spouse Name: N/A  . Number of Children: 3  . Years of Education: N/A   Occupational History  . Sales Other   Social History Main Topics  . Smoking status: Former Smoker -- 0.25 packs/day for 4 years    Types: Cigarettes    Quit date: 06/22/1972  . Smokeless tobacco: Never Used  .  Alcohol Use: Yes     Comment: wine a couple of times a week, occ  . Drug Use: No  . Sexual Activity: Not on file   Other Topics Concern  . Not on file   Social History Narrative    Vital Signs: BP 112/74 mmHg  Pulse 79  Temp(Src) 97.2 F (36.2 C) (Oral)  Resp 14  SpO2 100%  Physical Exam General: A&Ox3, NAD, ambulating without difficulty  Abd: Soft, NT, ND, RUQ procedure site without  skin changes or skin breakdown.   Imaging: Mr Abdomen W Wo Contrast  02/20/2015   CLINICAL DATA:  History of microwave ablation of posterior right hepatic lobe metastatic lesion in 2016, most recently June.  EXAM: MRI ABDOMEN WITHOUT AND WITH CONTRAST  TECHNIQUE: Multiplanar multisequence MR imaging of the abdomen was performed both before and after the administration of intravenous contrast.  CONTRAST:  13mL MULTIHANCE GADOBENATE DIMEGLUMINE 529 MG/ML IV SOLN  COMPARISON:  MRI of 11/09/2014. Ablation studies of 11/30/2014 and 09/28/2014.  FINDINGS: Motion degradation primarily involves the respiratory triggered series.  Lower chest: Normal heart size without pericardial or pleural effusion.  Hepatobiliary: Coagulative necrosis, as evidenced by T1 hyperintensity identified peripherally around the subcapsular posterior right hepatic lobe at the site of prior ablations. This measures maximally 4.5 x 2.7 cm on image 41 of series 1100. No post-contrast enhancement in this area to suggest locally recurrent disease.  No typical findings of new hepatic metastasis. Tiny foci of hypoenhancement in the left hepatic lobe, including on images 30 and 33 are not significantly changed and too small to characterize.  A subtle area of portal venous phase hyper enhancement in the right lobe of the liver measures 9 mm on image 60 of series 11002. This is not readily apparent on the prior exam, but is without correlate on other pulse sequences and phases of postcontrast imaging.  Normal gallbladder, without biliary ductal dilatation.  Pancreas: Normal, without mass or ductal dilatation.  Spleen: Normal  Adrenals/Urinary Tract: Normal adrenal glands. Bilateral renal lesions which are likely cysts. No hydronephrosis.  Stomach/Bowel: Normal stomach and abdominal bowel loops.  Vascular/Lymphatic: Normal caliber of the aorta and branch vessels. Retroaortic left renal vein. No retroperitoneal or retrocrural adenopathy.  Other: No  ascites.  Musculoskeletal: No acute osseous abnormality.  IMPRESSION: 1. Status post repeat ablation within the posterior segment right hepatic lobe. No evidence of locally recurrent disease. 2. Probable perfusion anomaly in the right hepatic lobe. Recommend attention on follow-up. 3. No evidence of extrahepatic metastatic disease within the abdomen.   Electronically Signed   By: Abigail Miyamoto M.D.   On: 02/20/2015 10:39    Labs:  CBC:  Recent Labs  11/28/14 0900 11/30/14 0710 12/01/14 0448 01/15/15 0815  WBC 4.5 4.9 7.3 4.2  HGB 12.4 12.4 11.3* 12.5  HCT 38.5 38.3 33.8* 37.2  PLT 276 261 216 275    COAGS:  Recent Labs  09/21/14 0830 11/30/14 0710  INR 0.96 0.97  APTT 29 29    BMP:  Recent Labs  11/28/14 0900 11/30/14 0710 12/01/14 0448 01/15/15 0815 02/12/15 0706  NA 140 139 139 140 143  K 4.3 4.0 4.2 4.0 4.1  CL 106 108 107  --  105  CO2 27 24 25 24 26   GLUCOSE 102* 86 121* 91 108*  BUN 15 14 15  18.4 17  CALCIUM 9.4 9.4 9.0 9.6 9.3  CREATININE 0.91 0.90 0.99 1.0 1.04*  GFRNONAA >60 >60 59*  --  57*  GFRAA >60 >60 >60  --  65    LIVER FUNCTION TESTS:  Recent Labs  11/30/14 0710 12/01/14 0448 01/15/15 0815 02/12/15 0706  BILITOT 0.6 1.7* 0.64 0.5  AST 22 379* 17 18  ALT 16 190* 12 12  ALKPHOS 101 76 97 93  PROT 8.0 6.6 7.7 7.1  ALBUMIN 3.9 3.2* 3.4* 3.8    Assessment and Plan: History of colon cancer with known hypermetabolic solitary hepatic metastasis. S/p prior percutaneous CT-guided percutaneous microwave ablation on 09/28/2014 S/p CT guided percutaneous thermal ablation of residual solitary hepatic metastasis on 11/30/2014- doing well post procedure without complaints. MRI imaging reviewed today without evidence of residual or recurrent disease, no evidence of extrahepatic metastatic disease is seen.  Will perform follow-up MRI in 3 months   Signed: Hedy Jacob 02/20/2015, 12:55 PM   I spent a total of 15 Minutes in face to face in  clinical consultation, greater than 50% of which was counseling/coordinating care for colon cancer with known hypermetabolic solitary hepatic metastasis.

## 2015-03-12 ENCOUNTER — Other Ambulatory Visit (HOSPITAL_BASED_OUTPATIENT_CLINIC_OR_DEPARTMENT_OTHER): Payer: Medicare Other

## 2015-03-12 ENCOUNTER — Ambulatory Visit (HOSPITAL_BASED_OUTPATIENT_CLINIC_OR_DEPARTMENT_OTHER): Payer: Medicare Other

## 2015-03-12 ENCOUNTER — Telehealth: Payer: Self-pay | Admitting: Oncology

## 2015-03-12 ENCOUNTER — Ambulatory Visit (HOSPITAL_BASED_OUTPATIENT_CLINIC_OR_DEPARTMENT_OTHER): Payer: Medicare Other | Admitting: Oncology

## 2015-03-12 VITALS — BP 125/72 | HR 107 | Temp 97.8°F | Resp 16

## 2015-03-12 DIAGNOSIS — Z95828 Presence of other vascular implants and grafts: Secondary | ICD-10-CM

## 2015-03-12 DIAGNOSIS — C189 Malignant neoplasm of colon, unspecified: Secondary | ICD-10-CM

## 2015-03-12 LAB — COMPREHENSIVE METABOLIC PANEL (CC13)
ALT: 16 U/L (ref 0–55)
AST: 20 U/L (ref 5–34)
Albumin: 3.5 g/dL (ref 3.5–5.0)
Alkaline Phosphatase: 100 U/L (ref 40–150)
Anion Gap: 7 mEq/L (ref 3–11)
BUN: 18.7 mg/dL (ref 7.0–26.0)
CALCIUM: 9.5 mg/dL (ref 8.4–10.4)
CO2: 24 mEq/L (ref 22–29)
Chloride: 108 mEq/L (ref 98–109)
Creatinine: 1.1 mg/dL (ref 0.6–1.1)
EGFR: 60 mL/min/{1.73_m2} — AB (ref >90)
Glucose: 122 mg/dl (ref 70–140)
POTASSIUM: 3.8 meq/L (ref 3.5–5.1)
Sodium: 140 mEq/L (ref 136–145)
Total Bilirubin: 0.5 mg/dL (ref 0.20–1.20)
Total Protein: 7.5 g/dL (ref 6.4–8.3)

## 2015-03-12 LAB — CBC WITH DIFFERENTIAL/PLATELET
BASO%: 0.2 % (ref 0.0–2.0)
BASOS ABS: 0 10*3/uL (ref 0.0–0.1)
EOS%: 1.3 % (ref 0.0–7.0)
Eosinophils Absolute: 0.1 10*3/uL (ref 0.0–0.5)
HEMATOCRIT: 39.1 % (ref 34.8–46.6)
HGB: 12.9 g/dL (ref 11.6–15.9)
LYMPH#: 1.5 10*3/uL (ref 0.9–3.3)
LYMPH%: 28.5 % (ref 14.0–49.7)
MCH: 30.6 pg (ref 25.1–34.0)
MCHC: 33 g/dL (ref 31.5–36.0)
MCV: 92.7 fL (ref 79.5–101.0)
MONO#: 0.3 10*3/uL (ref 0.1–0.9)
MONO%: 6.4 % (ref 0.0–14.0)
NEUT#: 3.4 10*3/uL (ref 1.5–6.5)
NEUT%: 63.6 % (ref 38.4–76.8)
Platelets: 252 10*3/uL (ref 145–400)
RBC: 4.22 10*6/uL (ref 3.70–5.45)
RDW: 13.2 % (ref 11.2–14.5)
WBC: 5.3 10*3/uL (ref 3.9–10.3)

## 2015-03-12 MED ORDER — SODIUM CHLORIDE 0.9 % IJ SOLN
10.0000 mL | INTRAMUSCULAR | Status: DC | PRN
  Administered 2015-03-12: 10 mL via INTRAVENOUS
  Filled 2015-03-12: qty 10

## 2015-03-12 MED ORDER — HEPARIN SOD (PORK) LOCK FLUSH 100 UNIT/ML IV SOLN
500.0000 [IU] | Freq: Once | INTRAVENOUS | Status: AC
Start: 2015-03-12 — End: 2015-03-12
  Administered 2015-03-12: 500 [IU] via INTRAVENOUS
  Filled 2015-03-12: qty 5

## 2015-03-12 NOTE — Patient Instructions (Signed)

## 2015-03-12 NOTE — Progress Notes (Signed)
Hematology and Oncology Follow Up Visit  Margaret Elliott [id number] Jun 26, 1950      Principle Diagnosis: 64 year old woman diagnosed with colon cancer in June 2014. She had presented with an abdominal pain and found to have a cecal mass resulting in obstruction and appendicitis.  She had Likely has stage IV disease with a liver mass.    Prior Therapy:  She is status post laparoscopic laparotomy, evacuation of a pelvic abscess and right hemicolectomy with ileocolonic anastomosis done on December 09, 2012. The pathology showed an invasive, well- differentiated colorectal adenocarcinoma. Tumor invades through the muscularis propria, with 2/18 lymph nodes involved, with the pathological staging T3 N1b disease. Her tumor was found to be microsatellite stable. KRAS wild type. She is also status post Port-A-Cath insertion that was done on 01/05/2013.  FOLFOX chemotherapy started 01/18/2013. Avastin to be added with cycle 2 on 02/01/2013. She is S/P 12 cycles completed in 05/2013.   She is a status post microwave ablation of metastatic lesion in the posterior segment of the right lobe of the liver completed on 09/28/2014 and repeated on 11/30/2014.   Current therapy: Observation and surveillance.  Interim History:  Margaret Elliott presents today for a followup visit. Since her last visit, she continues to do well. She underwent microwave ablation of her liver lesion in June 2016 and had no delayed complications. 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  Has not reported any change in her bowel habits. Has not reported any hematochezia or melena. Has not reported any fatigue or early satiety.He is able to ambulate, drive and work full time without any decline. Does not report any nausea. Does not report any vomiting. Does not report any pelvic pain. Does not report any bony pain. Her performance status and activity level remains excellent. Her appetite have  been also excellent and she has gained weight.   She is not report any headaches, blurry vision, syncope or seizures. She does not report any fevers, chills or sweats. She does not report any chest pain, palpitation or orthopnea. She does not report any wheezing, hemoptysis or hematemesis. She does not report any frequency urgency or hesitancy. Rest of her review of systems unremarkable.   Medications: I have reviewed the patient's current medications.  Current Outpatient Prescriptions  Medication Sig Dispense Refill  . acetaminophen (TYLENOL) 500 MG tablet Take 500 mg by mouth every 6 (six) hours as needed for pain.      . diphenhydrAMINE (BENADRYL) 25 mg capsule Take 25-50 mg by mouth daily as needed for itching. For allergic reaction      . lidocaine-prilocaine (EMLA) cream Apply 1 application topically as needed. Apply approx 1/2 tsp to skin over port, prior to chemotherapy treatments  1 kit  3  . Multiple Vitamin (MULTIVITAMIN) tablet Take 1 tablet by mouth daily.      . ondansetron (ZOFRAN) 8 MG tablet Take 1 tablet (8 mg total) by mouth every 8 (eight) hours as needed for nausea.  30 tablet  1   No current facility-administered medications for this visit.     Allergies: No Known Allergies  Past Medical History, Surgical history, Social history, and Family History were reviewed and updated.    Physical Exam:  Blood pressure 119/75, pulse 98, temperature 97.7 F (36.5 C), temperature source Oral, resp. rate 18, height $RemoveBe'5\' 4"'RhfORpcnb$  (1.626 m), weight 152 lb (68.947 kg). ECOG: 0 General appearance: alert awake , awake not in any  distress. Head: Normocephalic, without obvious abnormality. Neck: no adenopathy.  Lymph nodes: Cervical, supraclavicular, and axillary nodes normal. Heart:regular rate and rhythm, S1, S2 normal, no murmur, click, rub or gallop Lung:chest clear, no wheezing, rales, normal symmetric air entry Abdomen: soft, non-tender, without masses or organomegaly EXT:no  erythema, induration, or nodules Neurological: No deficits noted. Gait and mood are stable.   CBC    Component Value Date/Time   WBC 5.3 03/12/2015 0808   WBC 7.3 12/01/2014 0448   WBC 8.6 11/10/2011 1329   RBC 4.22 03/12/2015 0808   RBC 3.59* 12/01/2014 0448   RBC 3.95* 11/10/2011 1329   HGB 12.9 03/12/2015 0808   HGB 11.3* 12/01/2014 0448   HGB 11.3* 11/10/2011 1329   HCT 39.1 03/12/2015 0808   HCT 33.8* 12/01/2014 0448   HCT 35.8* 11/10/2011 1329   PLT 252 03/12/2015 0808   PLT 216 12/01/2014 0448   MCV 92.7 03/12/2015 0808   MCV 94.2 12/01/2014 0448   MCV 90.7 11/10/2011 1329   MCH 30.6 03/12/2015 0808   MCH 31.5 12/01/2014 0448   MCH 28.6 11/10/2011 1329   MCHC 33.0 03/12/2015 0808   MCHC 33.4 12/01/2014 0448   MCHC 31.6* 11/10/2011 1329   RDW 13.2 03/12/2015 0808   RDW 13.5 12/01/2014 0448   LYMPHSABS 1.5 03/12/2015 0808   LYMPHSABS 1.7 11/30/2014 0710   MONOABS 0.3 03/12/2015 0808   MONOABS 0.4 11/30/2014 0710   EOSABS 0.1 03/12/2015 0808   EOSABS 0.2 11/30/2014 0710   BASOSABS 0.0 03/12/2015 0808   BASOSABS 0.0 11/30/2014 0710    EXAM: MRI ABDOMEN WITHOUT AND WITH CONTRAST  TECHNIQUE: Multiplanar multisequence MR imaging of the abdomen was performed both before and after the administration of intravenous contrast.  CONTRAST: 71mL MULTIHANCE GADOBENATE DIMEGLUMINE 529 MG/ML IV SOLN  COMPARISON: MRI of 11/09/2014. Ablation studies of 11/30/2014 and 09/28/2014.  FINDINGS: Motion degradation primarily involves the respiratory triggered series.  Lower chest: Normal heart size without pericardial or pleural effusion.  Hepatobiliary: Coagulative necrosis, as evidenced by T1 hyperintensity identified peripherally around the subcapsular posterior right hepatic lobe at the site of prior ablations. This measures maximally 4.5 x 2.7 cm on image 41 of series 1100. No post-contrast enhancement in this area to suggest locally recurrent disease.  No  typical findings of new hepatic metastasis. Tiny foci of hypoenhancement in the left hepatic lobe, including on images 30 and 33 are not significantly changed and too small to characterize.  A subtle area of portal venous phase hyper enhancement in the right lobe of the liver measures 9 mm on image 60 of series 11002. This is not readily apparent on the prior exam, but is without correlate on other pulse sequences and phases of postcontrast imaging.  Normal gallbladder, without biliary ductal dilatation.  Pancreas: Normal, without mass or ductal dilatation.  Spleen: Normal  Adrenals/Urinary Tract: Normal adrenal glands. Bilateral renal lesions which are likely cysts. No hydronephrosis.  Stomach/Bowel: Normal stomach and abdominal bowel loops.  Vascular/Lymphatic: Normal caliber of the aorta and branch vessels. Retroaortic left renal vein. No retroperitoneal or retrocrural adenopathy.  Other: No ascites.  Musculoskeletal: No acute osseous abnormality.  IMPRESSION: 1. Status post repeat ablation within the posterior segment right hepatic lobe. No evidence of locally recurrent disease. 2. Probable perfusion anomaly in the right hepatic lobe. Recommend attention on follow-up. 3. No evidence of extrahepatic metastatic disease within the abdomen.    Impression and Plan:  64 year old woman with the following issues:  1. Advanced Colon  cancer. She presented with a large right cecal mass causing obstruction and abscess and appendicitis. She is status post 12 cycles of chemotherapy with PET/CT scan on 09/12/2013  showed no residual disease actually in the liver that is hypermetabolic.  Most recent PET/CT scan on 04/03/2014 showed recurrent activity in the posterior right hepatic lobe. MRI on 04/13/2014 showed that there is a concern of the right lobe of the liver, a possible mass measuring 12 mm.   She is status post microwave ablation of the liver in April 2016 and that  was repeated in June 2016. MRI on 02/20/2015 was reviewed today and showed no evidence of any residual disease.  The plan is to continue with active surveillance and restage her with a PET scan in 4 months. If she has no evidence of systemic disease, continued observation and surveillance will be warranted. If she does develop systemic metastasis, systemic chemotherapy would be used at that time with CPT-11 and EGFR monoclonal antibody will be used. Her K-ras mutation was not detected and she would be an excellent candidate for that.  On the other hand, if she does have solitary liver metastasis, more directed therapy could be considered.   2. Intravenous access. PAC in place without complications. We will flush every 8 weeks.  3. Right-sided back pain related to disc disease: She is doing better at this time.  4. 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  5. Hypertension: Blood pressure is normal at this time. this was exacerbated by systemic chemotherapy.   6. Followup: Will be in 4 months and sooner if needed to.    [facility] MD 03/12/2015

## 2015-03-12 NOTE — Telephone Encounter (Signed)
Gave and printed avs for pt for NOV and jan 2017

## 2015-05-07 ENCOUNTER — Ambulatory Visit (HOSPITAL_BASED_OUTPATIENT_CLINIC_OR_DEPARTMENT_OTHER): Payer: Medicare Other

## 2015-05-07 VITALS — BP 141/82 | HR 77 | Temp 98.2°F | Resp 16

## 2015-05-07 DIAGNOSIS — Z452 Encounter for adjustment and management of vascular access device: Secondary | ICD-10-CM

## 2015-05-07 DIAGNOSIS — Z95828 Presence of other vascular implants and grafts: Secondary | ICD-10-CM

## 2015-05-07 DIAGNOSIS — C189 Malignant neoplasm of colon, unspecified: Secondary | ICD-10-CM

## 2015-05-07 MED ORDER — HEPARIN SOD (PORK) LOCK FLUSH 100 UNIT/ML IV SOLN
500.0000 [IU] | Freq: Once | INTRAVENOUS | Status: AC
  Administered 2015-05-07: 500 [IU] via INTRAVENOUS
  Filled 2015-05-07: qty 5

## 2015-05-07 MED ORDER — SODIUM CHLORIDE 0.9 % IJ SOLN
10.0000 mL | INTRAMUSCULAR | Status: DC | PRN
  Administered 2015-05-07: 10 mL via INTRAVENOUS
  Filled 2015-05-07: qty 10

## 2015-05-07 NOTE — Patient Instructions (Signed)

## 2015-07-10 ENCOUNTER — Other Ambulatory Visit (HOSPITAL_BASED_OUTPATIENT_CLINIC_OR_DEPARTMENT_OTHER): Payer: Medicare Other

## 2015-07-10 ENCOUNTER — Ambulatory Visit (HOSPITAL_COMMUNITY)
Admission: RE | Admit: 2015-07-10 | Discharge: 2015-07-10 | Disposition: A | Discharge status: Home / Self Care | Payer: Medicare Other | Source: Ambulatory Visit | Attending: Oncology | Admitting: Oncology

## 2015-07-10 ENCOUNTER — Ambulatory Visit (HOSPITAL_BASED_OUTPATIENT_CLINIC_OR_DEPARTMENT_OTHER): Payer: Medicare Other

## 2015-07-10 ENCOUNTER — Other Ambulatory Visit: Payer: Self-pay | Admitting: Oncology

## 2015-07-10 DIAGNOSIS — C189 Malignant neoplasm of colon, unspecified: Secondary | ICD-10-CM | POA: Insufficient documentation

## 2015-07-10 DIAGNOSIS — Z0189 Encounter for other specified special examinations: Secondary | ICD-10-CM | POA: Insufficient documentation

## 2015-07-10 DIAGNOSIS — C180 Malignant neoplasm of cecum: Secondary | ICD-10-CM | POA: Diagnosis present

## 2015-07-10 DIAGNOSIS — Z95828 Presence of other vascular implants and grafts: Secondary | ICD-10-CM

## 2015-07-10 LAB — COMPREHENSIVE METABOLIC PANEL
ALBUMIN: 3.6 g/dL (ref 3.5–5.0)
ALK PHOS: 100 U/L (ref 40–150)
ALT: 13 U/L (ref 0–55)
ANION GAP: 8 meq/L (ref 3–11)
AST: 18 U/L (ref 5–34)
BUN: 19.5 mg/dL (ref 7.0–26.0)
CO2: 23 mEq/L (ref 22–29)
Calcium: 9.4 mg/dL (ref 8.4–10.4)
Chloride: 109 mEq/L (ref 98–109)
Creatinine: 1.1 mg/dL (ref 0.6–1.1)
EGFR: 62 mL/min/{1.73_m2} — AB (ref >90)
GLUCOSE: 93 mg/dL (ref 70–140)
POTASSIUM: 4 meq/L (ref 3.5–5.1)
SODIUM: 140 meq/L (ref 136–145)
Total Bilirubin: 0.63 mg/dL (ref 0.20–1.20)
Total Protein: 7.8 g/dL (ref 6.4–8.3)

## 2015-07-10 LAB — CBC WITH DIFFERENTIAL/PLATELET
BASO%: 0.2 % (ref 0.0–2.0)
BASOS ABS: 0 10*3/uL (ref 0.0–0.1)
EOS ABS: 0.1 10*3/uL (ref 0.0–0.5)
EOS%: 3 % (ref 0.0–7.0)
HCT: 38 % (ref 34.8–46.6)
HEMOGLOBIN: 12.8 g/dL (ref 11.6–15.9)
LYMPH%: 34.8 % (ref 14.0–49.7)
MCH: 31.1 pg (ref 25.1–34.0)
MCHC: 33.7 g/dL (ref 31.5–36.0)
MCV: 92.5 fL (ref 79.5–101.0)
MONO#: 0.4 10*3/uL (ref 0.1–0.9)
MONO%: 10.1 % (ref 0.0–14.0)
NEUT#: 2.1 10*3/uL (ref 1.5–6.5)
NEUT%: 51.9 % (ref 38.4–76.8)
PLATELETS: 236 10*3/uL (ref 145–400)
RBC: 4.11 10*6/uL (ref 3.70–5.45)
RDW: 13 % (ref 11.2–14.5)
WBC: 4.1 10*3/uL (ref 3.9–10.3)
lymph#: 1.4 10*3/uL (ref 0.9–3.3)

## 2015-07-10 LAB — GLUCOSE, CAPILLARY: Glucose-Capillary: 93 mg/dL (ref 65–99)

## 2015-07-10 MED ORDER — FLUDEOXYGLUCOSE F - 18 (FDG) INJECTION
8.4900 | Freq: Once | INTRAVENOUS | Status: AC | PRN
  Administered 2015-07-10: 8.49 via INTRAVENOUS

## 2015-07-10 MED ORDER — HEPARIN SOD (PORK) LOCK FLUSH 100 UNIT/ML IV SOLN
500.0000 [IU] | Freq: Once | INTRAVENOUS | Status: DC
  Filled 2015-07-10: qty 5

## 2015-07-10 MED ORDER — SODIUM CHLORIDE 0.9 % IJ SOLN
10.0000 mL | INTRAMUSCULAR | Status: DC | PRN
  Administered 2015-07-10: 10 mL via INTRAVENOUS
  Filled 2015-07-10: qty 10

## 2015-07-10 NOTE — Patient Instructions (Signed)
Patient to have port needle deaccessed after PET scan today before leaving [facility] An implanted port is a type of central line that is placed under the skin. Central lines are used to provide IV access when treatment or nutrition needs to be given through a person's veins. Implanted ports are used for long-term IV access. An implanted port may be placed because:   You need IV medicine that would be irritating to the small veins in your hands or arms.   You need long-term IV medicines, such as antibiotics.   You need IV nutrition for a long period.   You need frequent blood draws for lab tests.   You need dialysis.  Implanted ports are usually placed in the chest area, but they can also be placed in the upper arm, the abdomen, or the leg. An implanted port has two main parts:   Reservoir. The reservoir is round and will appear as a small, raised area under your skin. The reservoir is the part where a needle is inserted to give medicines or draw blood.   Catheter. The catheter is a thin, flexible tube that extends from the reservoir. The catheter is placed into a large vein. Medicine that is inserted into the reservoir goes into the catheter and then into the vein.  HOW WILL I CARE FOR MY INCISION SITE? Do not get the incision site wet. Bathe or shower as directed by your health care provider.  HOW IS MY PORT ACCESSED? Special steps must be taken to access the port:   Before the port is accessed, a numbing cream can be placed on the skin. This helps numb the skin over the port site.   Your health care provider uses a sterile technique to access the port.  Your health care provider must put on a mask and sterile gloves.  The skin over your port is cleaned carefully with an antiseptic and allowed to dry.  The port is gently pinched between sterile gloves, and a needle is inserted into the port.  Only "non-coring" port needles should  be used to access the port. Once the port is accessed, a blood return should be checked. This helps ensure that the port is in the vein and is not clogged.   If your port needs to remain accessed for a constant infusion, a clear (transparent) bandage will be placed over the needle site. The bandage and needle will need to be changed every week, or as directed by your health care provider.   Keep the bandage covering the needle clean and dry. Do not get it wet. Follow your health care provider's instructions on how to take a shower or bath while the port is accessed.   If your port does not need to stay accessed, no bandage is needed over the port.  WHAT IS FLUSHING? Flushing helps keep the port from getting clogged. Follow your health care provider's instructions on how and when to flush the port. Ports are usually flushed with saline solution or a medicine called heparin. The need for flushing will depend on how the port is used.   If the port is used for intermittent medicines or blood draws, the port will need to be flushed:   After medicines have been given.   After blood has been drawn.   As part of routine maintenance.   If a constant infusion is running, the port may not need to be flushed.  HOW LONG WILL  MY PORT STAY IMPLANTED? The port can stay in for as long as your health care provider thinks it is needed. When it is time for the port to come out, surgery will be done to remove it. The procedure is similar to the one performed when the port was put in.  WHEN SHOULD I SEEK IMMEDIATE MEDICAL CARE? When you have an implanted port, you should seek immediate medical care if:   You notice a bad smell coming from the incision site.   You have swelling, redness, or drainage at the incision site.   You have more swelling or pain at the port site or the surrounding area.   You have a fever that is not controlled with medicine.   This information is not intended to replace  advice given to you by your health care provider. Make sure you discuss any questions you have with your health care provider.   Document Released: 06/08/2005 Document Revised: 03/29/2013 Document Reviewed: 02/13/2013 Elsevier Interactive Patient Education 2016 [employer] Inc[employer]  

## 2015-07-11 LAB — CEA (PARALLEL TESTING): CEA: 1.5 ng/mL (ref 0.0–5.0)

## 2015-07-11 LAB — CEA: CEA1: 10.8 ng/mL — AB (ref 0.0–4.7)

## 2015-07-12 ENCOUNTER — Telehealth: Payer: Self-pay | Admitting: Hematology and Oncology

## 2015-07-12 ENCOUNTER — Ambulatory Visit (HOSPITAL_BASED_OUTPATIENT_CLINIC_OR_DEPARTMENT_OTHER): Payer: Medicare Other | Admitting: Oncology

## 2015-07-12 VITALS — BP 159/98 | HR 96 | Temp 97.5°F | Resp 16 | Ht 64.0 in | Wt 172.8 lb

## 2015-07-12 DIAGNOSIS — C180 Malignant neoplasm of cecum: Secondary | ICD-10-CM

## 2015-07-12 DIAGNOSIS — G609 Hereditary and idiopathic neuropathy, unspecified: Secondary | ICD-10-CM | POA: Diagnosis not present

## 2015-07-12 DIAGNOSIS — C189 Malignant neoplasm of colon, unspecified: Secondary | ICD-10-CM

## 2015-07-12 MED ORDER — LIDOCAINE-PRILOCAINE 2.5-2.5 % EX KIT: 1.0000 "application " | PACK | CUTANEOUS | Status: DC | PRN

## 2015-07-12 NOTE — Telephone Encounter (Signed)
per pof to sch pt appt-gave pt copy of avs °

## 2015-07-12 NOTE — Progress Notes (Signed)
Hematology and Oncology Follow Up Visit  STEFANY STARACE [id number] 11/17/1950      Principle Diagnosis: 65 year old woman diagnosed with colon cancer in June 2014. She had presented with an abdominal pain and found to have a cecal mass resulting in obstruction and appendicitis.  She had Likely has stage IV disease with a liver mass.    Prior Therapy:  She is status post laparoscopic laparotomy, evacuation of a pelvic abscess and right hemicolectomy with ileocolonic anastomosis done on December 09, 2012. The pathology showed an invasive, well- differentiated colorectal adenocarcinoma. Tumor invades through the muscularis propria, with 2/18 lymph nodes involved, with the pathological staging T3 N1b disease. Her tumor was found to be microsatellite stable. KRAS wild type.   She is also status post Port-A-Cath insertion that was done on 01/05/2013.  FOLFOX chemotherapy started 01/18/2013. Avastin to be added with cycle 2 on 02/01/2013. She is S/P 12 cycles completed in 05/2013.   She is a status post microwave ablation of metastatic lesion in the posterior segment of the right lobe of the liver completed on 09/28/2014 and repeated on 11/30/2014.   Current therapy: Observation and surveillance.  Interim History:  Ms. Virden presents today for a followup visit. Since her last visit, she notes no recent complaints. She denies any abdominal pain, other satiety or change in her bowels. She has regained all activities of daily living without any decline. He is able to ambulate, drive and work full time without any decline. Does not report any pelvic pain. Does not report any bony pain. Her performance status and activity level remains excellent.   She does have very faint peripheral neuropathy is residual from chemotherapy which have not changed. Is not interfering with function or activity level.  She is not report any headaches, blurry vision, syncope or seizures. She does not report any fevers, chills  or sweats. She does not report any chest pain, palpitation or orthopnea. She does not report any wheezing, hemoptysis or hematemesis. She does not report any frequency urgency or hesitancy. Does not report any lymphadenopathy or petechiae. Rest of her review of systems unremarkable.   Medications: I have reviewed the patient's current medications.  Current Outpatient Prescriptions  Medication Sig Dispense Refill  . acetaminophen (TYLENOL) 500 MG tablet Take 500 mg by mouth every 6 (six) hours as needed for pain.      . diphenhydrAMINE (BENADRYL) 25 mg capsule Take 25-50 mg by mouth daily as needed for itching. For allergic reaction      . lidocaine-prilocaine (EMLA) cream Apply 1 application topically as needed. Apply approx 1/2 tsp to skin over port, prior to chemotherapy treatments  1 kit  3  . Multiple Vitamin (MULTIVITAMIN) tablet Take 1 tablet by mouth daily.      . ondansetron (ZOFRAN) 8 MG tablet Take 1 tablet (8 mg total) by mouth every 8 (eight) hours as needed for nausea.  30 tablet  1   No current facility-administered medications for this visit.     Allergies: No Known Allergies  Past Medical History, Surgical history, Social history, and Family History were reviewed and updated.    Physical Exam:  Blood pressure 159/98, pulse 96, temperature 97.5 F (36.4 C), temperature source Oral, resp. rate 16, height '5\' 4"'$  (1.626 m), weight 172 lb 12.8 oz (78.382 kg), SpO2 98 %.  ECOG: 0 General appearance: alert awake pleasant woman without distress. Head: Normocephalic, without obvious abnormality. No oral ulcers or lesions. Neck: no adenopathy.  Lymph nodes: Cervical,  supraclavicular, and axillary nodes normal. Heart:regular rate and rhythm, S1, S2 normal, no murmur, click, rub or gallop Lung:chest clear, no wheezing, rales, normal symmetric air entry Abdomen: soft, non-tender, without masses or organomegaly no shifting dullness or ascites. EXT:no erythema, induration, or  nodules Neurological: No deficits noted. No neurological deficits.  CBC    Component Value Date/Time   WBC 4.1 07/10/2015 0757   WBC 7.3 12/01/2014 0448   WBC 8.6 11/10/2011 1329   RBC 4.11 07/10/2015 0757   RBC 3.59* 12/01/2014 0448   RBC 3.95* 11/10/2011 1329   HGB 12.8 07/10/2015 0757   HGB 11.3* 12/01/2014 0448   HGB 11.3* 11/10/2011 1329   HCT 38.0 07/10/2015 0757   HCT 33.8* 12/01/2014 0448   HCT 35.8* 11/10/2011 1329   PLT 236 07/10/2015 0757   PLT 216 12/01/2014 0448   MCV 92.5 07/10/2015 0757   MCV 94.2 12/01/2014 0448   MCV 90.7 11/10/2011 1329   MCH 31.1 07/10/2015 0757   MCH 31.5 12/01/2014 0448   MCH 28.6 11/10/2011 1329   MCHC 33.7 07/10/2015 0757   MCHC 33.4 12/01/2014 0448   MCHC 31.6* 11/10/2011 1329   RDW 13.0 07/10/2015 0757   RDW 13.5 12/01/2014 0448   LYMPHSABS 1.4 07/10/2015 0757   LYMPHSABS 1.7 11/30/2014 0710   MONOABS 0.4 07/10/2015 0757   MONOABS 0.4 11/30/2014 0710   EOSABS 0.1 07/10/2015 0757   EOSABS 0.2 11/30/2014 0710   BASOSABS 0.0 07/10/2015 0757   BASOSABS 0.0 11/30/2014 0710   EXAM: NUCLEAR MEDICINE PET SKULL BASE TO THIGH  TECHNIQUE: 8.5 mCi F-18 FDG was injected intravenously. Full-ring PET imaging was performed from the skull base to thigh after the radiotracer. CT data was obtained and used for attenuation correction and anatomic localization.  FASTING BLOOD GLUCOSE: Value: 93 mg/dl  COMPARISON: None.  FINDINGS: NECK  No hypermetabolic lymph nodes in the neck.  CHEST  No hypermetabolic mediastinal or hilar nodes. Small LEFT upper lobe pulmonary nodule measuring 5 mm on image 61, series 4) unchanged.  ABDOMEN/PELVIS  There is no focal abnormal metabolic activity within liver to suggests recurrence of metastatic liver disease. There is a region photopenia in the posterior RIGHT hepatic lobe with prior ablation site.  There is a nodular focus of metabolic activity in the ventral peritoneal surface  measuring 11 mm on image 111, series 4 with SUV max equal 7.3.  There is extensive metabolic activity associated with the small bowel and colon with equal degree however this ventral nodularity cannot be confidently connected with the bowel and is concerning for peritoneal nodule.  There is also hypermetabolic nodularity along the ventral peritoneal surface at the level of the umbilicus (image 517 of the CT data set which also cannot be clearly connected to small bowel however there are multiple loops of lungs region.  There is no hypermetabolic retroperitoneal periportal adenopathy. No hypermetabolic pelvic adenopathy.  SKELETON  No focal hypermetabolic activity to suggest skeletal metastasis.  IMPRESSION: 1. No evidence of recurrence within the liver. Photopenia at the ablation site. 2. Nodular foci of metabolic activity in the RIGHT upper quadrant ventral peritoneal surface is concerning for peritoneal metastasis. That the small bowel has intense metabolic activity and is in this region cannot completely exclude a loop of bowel. Similar findings at the level the umbilicus. The patient may benefit from a diagnostic CT the abdomen pelvis to evaluate these potential ventral peritoneal nodular metastasis. 3. No additional evidence of metastatic disease.    Impression and Plan:  65 year old woman with the following issues:  1. Advanced Colon cancer. She presented with a large right cecal mass causing obstruction and abscess and appendicitis. She is status post 12 cycles of chemotherapy with PET/CT scan on 09/12/2013  showed no residual disease actually in the liver that is hypermetabolic.   PET/CT scan on 07/10/2015 was reviewed today and showed no clear-cut evidence of metastasis. She does have nodular foci of metabolic activity in the right upper quadrant concerning for peritoneal metastasis but this could also be a loop of bowel. Radiology recommended CT scan of the  abdomen and pelvis for better clarification. The plan is to obtain this in the immediate future and if no evidence of disease is detected, continued observation surveillance would be warranted.    If she does develop systemic metastasis, systemic chemotherapy would be used at that time with CPT-11 and EGFR monoclonal antibody will be used. Her K-ras mutation was not detected and she would be an excellent candidate for that.   2. Intravenous access. PAC in place without complications. We will flush every 8 weeks. EMLA cream will be refilled today.  3. 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  4. Followup: Will be in 4 months and sooner if scan is abnormal.   Kourtney Terriquez MD 07/12/2015

## 2015-07-12 NOTE — Addendum Note (Signed)
Addended by: Wyatt Portela on: 07/12/2015 09:35 AM   Modules accepted: Orders

## 2015-07-16 ENCOUNTER — Encounter: Payer: Self-pay | Admitting: Oncology

## 2015-07-16 NOTE — Progress Notes (Signed)
Sent prior auth for lidocaine cream °

## 2015-07-18 ENCOUNTER — Ambulatory Visit (HOSPITAL_COMMUNITY)
Admission: RE | Admit: 2015-07-18 | Discharge: 2015-07-18 | Disposition: A | Discharge status: Home / Self Care | Payer: Medicare Other | Source: Ambulatory Visit | Attending: Oncology | Admitting: Oncology

## 2015-07-18 ENCOUNTER — Other Ambulatory Visit: Payer: Self-pay | Admitting: Oncology

## 2015-07-18 ENCOUNTER — Other Ambulatory Visit (HOSPITAL_COMMUNITY): Payer: Self-pay | Admitting: Interventional Radiology

## 2015-07-18 ENCOUNTER — Encounter (HOSPITAL_COMMUNITY): Payer: Self-pay

## 2015-07-18 DIAGNOSIS — C787 Secondary malignant neoplasm of liver and intrahepatic bile duct: Secondary | ICD-10-CM | POA: Diagnosis not present

## 2015-07-18 DIAGNOSIS — C786 Secondary malignant neoplasm of retroperitoneum and peritoneum: Secondary | ICD-10-CM | POA: Insufficient documentation

## 2015-07-18 DIAGNOSIS — C189 Malignant neoplasm of colon, unspecified: Secondary | ICD-10-CM

## 2015-07-18 DIAGNOSIS — Z9889 Other specified postprocedural states: Secondary | ICD-10-CM | POA: Diagnosis not present

## 2015-07-18 DIAGNOSIS — K769 Liver disease, unspecified: Secondary | ICD-10-CM

## 2015-07-18 MED ORDER — IOHEXOL 300 MG/ML  SOLN
100.0000 mL | Freq: Once | INTRAMUSCULAR | Status: AC | PRN
  Administered 2015-07-18: 100 mL via INTRAVENOUS

## 2015-07-23 ENCOUNTER — Telehealth: Payer: Self-pay

## 2015-07-23 NOTE — Telephone Encounter (Signed)
Pt called stating she cannot get her emla cream until the insurance got a prior [place]. She is asking if we received anything from the insurance company.

## 2015-08-08 ENCOUNTER — Encounter: Payer: Self-pay | Admitting: Oncology

## 2015-08-08 NOTE — Progress Notes (Signed)
I sent auth req to [employer] and they are unable to locate patient. We need correct insurance See prev notes.

## 2015-08-20 ENCOUNTER — Encounter: Payer: Self-pay | Admitting: Oncology

## 2015-08-20 NOTE — Progress Notes (Signed)
Sent prior auth req to [employer] for lidocaine cream

## 2015-08-26 ENCOUNTER — Other Ambulatory Visit: Payer: Self-pay | Admitting: *Deleted

## 2015-08-26 MED ORDER — LIDOCAINE-PRILOCAINE 2.5-2.5 % EX KIT: 1.0000 "application " | PACK | CUTANEOUS | Status: DC | PRN

## 2015-08-30 ENCOUNTER — Telehealth: Payer: Self-pay | Admitting: *Deleted

## 2015-08-30 ENCOUNTER — Encounter: Payer: Self-pay | Admitting: Oncology

## 2015-08-30 NOTE — Telephone Encounter (Signed)
Patient calling to c/o emla cream needing to be prior authorized. Given to raquel in managed care. Left 2 tubes of emla, from [employer], at front for patient p/u, until authorization is done.

## 2015-08-30 NOTE — Progress Notes (Signed)
Per [employer] patient doesn't hve phar coverage. Noted for registration to get correct card copies for prior auth to be done for the lidocaine cream

## 2015-09-06 ENCOUNTER — Encounter: Payer: Self-pay | Admitting: *Deleted

## 2015-09-06 DIAGNOSIS — C189 Malignant neoplasm of colon, unspecified: Secondary | ICD-10-CM

## 2015-09-09 ENCOUNTER — Ambulatory Visit (HOSPITAL_BASED_OUTPATIENT_CLINIC_OR_DEPARTMENT_OTHER): Payer: Medicare Other

## 2015-09-09 ENCOUNTER — Other Ambulatory Visit (HOSPITAL_BASED_OUTPATIENT_CLINIC_OR_DEPARTMENT_OTHER): Payer: Medicare Other

## 2015-09-09 DIAGNOSIS — C189 Malignant neoplasm of colon, unspecified: Secondary | ICD-10-CM

## 2015-09-09 DIAGNOSIS — C180 Malignant neoplasm of cecum: Secondary | ICD-10-CM | POA: Diagnosis not present

## 2015-09-09 DIAGNOSIS — Z95828 Presence of other vascular implants and grafts: Secondary | ICD-10-CM

## 2015-09-09 LAB — CBC WITH DIFFERENTIAL/PLATELET
BASO%: 0.3 % (ref 0.0–2.0)
BASOS ABS: 0 10*3/uL (ref 0.0–0.1)
EOS ABS: 0.1 10*3/uL (ref 0.0–0.5)
EOS%: 3.8 % (ref 0.0–7.0)
HEMATOCRIT: 37.2 % (ref 34.8–46.6)
HEMOGLOBIN: 12.6 g/dL (ref 11.6–15.9)
LYMPH%: 40.7 % (ref 14.0–49.7)
MCH: 31.4 pg (ref 25.1–34.0)
MCHC: 33.9 g/dL (ref 31.5–36.0)
MCV: 92.8 fL (ref 79.5–101.0)
MONO#: 0.4 10*3/uL (ref 0.1–0.9)
MONO%: 10.5 % (ref 0.0–14.0)
NEUT#: 1.5 10*3/uL (ref 1.5–6.5)
NEUT%: 44.7 % (ref 38.4–76.8)
PLATELETS: 250 10*3/uL (ref 145–400)
RBC: 4.01 10*6/uL (ref 3.70–5.45)
RDW: 13 % (ref 11.2–14.5)
WBC: 3.4 10*3/uL — ABNORMAL LOW (ref 3.9–10.3)
lymph#: 1.4 10*3/uL (ref 0.9–3.3)

## 2015-09-09 LAB — COMPREHENSIVE METABOLIC PANEL
ALBUMIN: 3.5 g/dL (ref 3.5–5.0)
ALK PHOS: 85 U/L (ref 40–150)
ALT: 15 U/L (ref 0–55)
ANION GAP: 6 meq/L (ref 3–11)
AST: 22 U/L (ref 5–34)
BUN: 13.2 mg/dL (ref 7.0–26.0)
CALCIUM: 9.2 mg/dL (ref 8.4–10.4)
CO2: 26 mEq/L (ref 22–29)
Chloride: 108 mEq/L (ref 98–109)
Creatinine: 0.9 mg/dL (ref 0.6–1.1)
EGFR: 74 mL/min/{1.73_m2} — AB (ref >90)
Glucose: 104 mg/dl (ref 70–140)
POTASSIUM: 3.9 meq/L (ref 3.5–5.1)
SODIUM: 140 meq/L (ref 136–145)
Total Bilirubin: 0.77 mg/dL (ref 0.20–1.20)
Total Protein: 7.7 g/dL (ref 6.4–8.3)

## 2015-09-09 MED ORDER — SODIUM CHLORIDE 0.9% FLUSH
10.0000 mL | INTRAVENOUS | Status: DC | PRN
  Administered 2015-09-09: 10 mL via INTRAVENOUS
  Filled 2015-09-09: qty 10

## 2015-09-09 MED ORDER — HEPARIN SOD (PORK) LOCK FLUSH 100 UNIT/ML IV SOLN
500.0000 [IU] | Freq: Once | INTRAVENOUS | Status: AC
  Administered 2015-09-09: 500 [IU] via INTRAVENOUS
  Filled 2015-09-09: qty 5

## 2015-09-09 NOTE — Patient Instructions (Signed)

## 2015-09-10 LAB — CEA (PARALLEL TESTING): CEA: 1.9 ng/mL

## 2015-09-10 LAB — CEA: CEA1: 18.1 ng/mL — AB (ref 0.0–4.7)

## 2015-10-11 ENCOUNTER — Other Ambulatory Visit: Payer: Self-pay | Admitting: Oncology

## 2015-10-11 DIAGNOSIS — C189 Malignant neoplasm of colon, unspecified: Secondary | ICD-10-CM

## 2015-10-14 ENCOUNTER — Telehealth: Payer: Self-pay | Admitting: *Deleted

## 2015-10-14 NOTE — Telephone Encounter (Signed)
Patient called asking if she is to have PET scan and CT scan and if they can be done on the same day.  Both test ordered by Dr. Alen Blew.  Call transferred to [facility].

## 2015-11-08 ENCOUNTER — Ambulatory Visit (HOSPITAL_BASED_OUTPATIENT_CLINIC_OR_DEPARTMENT_OTHER): Payer: Medicare Other

## 2015-11-08 ENCOUNTER — Encounter (HOSPITAL_COMMUNITY)
Admission: RE | Admit: 2015-11-08 | Discharge: 2015-11-08 | Disposition: A | Discharge status: Home / Self Care | Payer: Medicare Other | Source: Ambulatory Visit | Attending: Oncology | Admitting: Oncology

## 2015-11-08 ENCOUNTER — Other Ambulatory Visit (HOSPITAL_BASED_OUTPATIENT_CLINIC_OR_DEPARTMENT_OTHER): Payer: Medicare Other

## 2015-11-08 VITALS — BP 119/78 | HR 81 | Temp 98.0°F | Resp 18

## 2015-11-08 DIAGNOSIS — C189 Malignant neoplasm of colon, unspecified: Secondary | ICD-10-CM

## 2015-11-08 DIAGNOSIS — C180 Malignant neoplasm of cecum: Secondary | ICD-10-CM

## 2015-11-08 DIAGNOSIS — Z95828 Presence of other vascular implants and grafts: Secondary | ICD-10-CM

## 2015-11-08 LAB — COMPREHENSIVE METABOLIC PANEL
ALT: 13 U/L (ref 0–55)
AST: 16 U/L (ref 5–34)
Albumin: 3.6 g/dL (ref 3.5–5.0)
Alkaline Phosphatase: 91 U/L (ref 40–150)
Anion Gap: 7 mEq/L (ref 3–11)
BUN: 20.3 mg/dL (ref 7.0–26.0)
CHLORIDE: 110 meq/L — AB (ref 98–109)
CO2: 25 meq/L (ref 22–29)
CREATININE: 1 mg/dL (ref 0.6–1.1)
Calcium: 9.5 mg/dL (ref 8.4–10.4)
EGFR: 70 mL/min/{1.73_m2} — ABNORMAL LOW (ref >90)
GLUCOSE: 94 mg/dL (ref 70–140)
Potassium: 3.9 mEq/L (ref 3.5–5.1)
SODIUM: 142 meq/L (ref 136–145)
TOTAL PROTEIN: 7.8 g/dL (ref 6.4–8.3)
Total Bilirubin: 0.5 mg/dL (ref 0.20–1.20)

## 2015-11-08 LAB — CBC WITH DIFFERENTIAL/PLATELET
BASO%: 0.6 % (ref 0.0–2.0)
BASOS ABS: 0 10*3/uL (ref 0.0–0.1)
EOS ABS: 0.1 10*3/uL (ref 0.0–0.5)
EOS%: 2.2 % (ref 0.0–7.0)
HCT: 38.9 % (ref 34.8–46.6)
HGB: 12.9 g/dL (ref 11.6–15.9)
LYMPH%: 39.6 % (ref 14.0–49.7)
MCH: 30.6 pg (ref 25.1–34.0)
MCHC: 33.1 g/dL (ref 31.5–36.0)
MCV: 92.5 fL (ref 79.5–101.0)
MONO#: 0.4 10*3/uL (ref 0.1–0.9)
MONO%: 9.1 % (ref 0.0–14.0)
NEUT%: 48.5 % (ref 38.4–76.8)
NEUTROS ABS: 2 10*3/uL (ref 1.5–6.5)
PLATELETS: 236 10*3/uL (ref 145–400)
RBC: 4.2 10*6/uL (ref 3.70–5.45)
RDW: 13.4 % (ref 11.2–14.5)
WBC: 4.2 10*3/uL (ref 3.9–10.3)
lymph#: 1.7 10*3/uL (ref 0.9–3.3)

## 2015-11-08 LAB — GLUCOSE, CAPILLARY: Glucose-Capillary: 95 mg/dL (ref 65–99)

## 2015-11-08 MED ORDER — SODIUM CHLORIDE 0.9% FLUSH
10.0000 mL | INTRAVENOUS | Status: DC | PRN
  Administered 2015-11-08: 10 mL via INTRAVENOUS
  Filled 2015-11-08: qty 10

## 2015-11-08 MED ORDER — FLUDEOXYGLUCOSE F - 18 (FDG) INJECTION
8.6000 | Freq: Once | INTRAVENOUS | Status: AC | PRN
  Administered 2015-11-08: 8.6 via INTRAVENOUS

## 2015-11-08 NOTE — Patient Instructions (Signed)

## 2015-11-09 LAB — CEA: CEA: 45.8 ng/mL — ABNORMAL HIGH (ref 0.0–4.7)

## 2015-11-12 ENCOUNTER — Ambulatory Visit (HOSPITAL_BASED_OUTPATIENT_CLINIC_OR_DEPARTMENT_OTHER): Payer: Medicare Other | Admitting: Oncology

## 2015-11-12 ENCOUNTER — Telehealth: Payer: Self-pay | Admitting: Oncology

## 2015-11-12 ENCOUNTER — Other Ambulatory Visit: Payer: Medicare Other

## 2015-11-12 ENCOUNTER — Ambulatory Visit (HOSPITAL_COMMUNITY): Payer: Medicare Other

## 2015-11-12 VITALS — BP 160/89 | HR 87 | Temp 98.2°F | Resp 16 | Ht 64.0 in | Wt 176.7 lb

## 2015-11-12 DIAGNOSIS — C787 Secondary malignant neoplasm of liver and intrahepatic bile duct: Secondary | ICD-10-CM | POA: Diagnosis not present

## 2015-11-12 DIAGNOSIS — G609 Hereditary and idiopathic neuropathy, unspecified: Secondary | ICD-10-CM

## 2015-11-12 DIAGNOSIS — C779 Secondary and unspecified malignant neoplasm of lymph node, unspecified: Secondary | ICD-10-CM

## 2015-11-12 DIAGNOSIS — C19 Malignant neoplasm of rectosigmoid junction: Secondary | ICD-10-CM | POA: Diagnosis not present

## 2015-11-12 DIAGNOSIS — C189 Malignant neoplasm of colon, unspecified: Secondary | ICD-10-CM

## 2015-11-12 NOTE — Telephone Encounter (Signed)
Faxed records to Dr. Lyla Glassing

## 2015-11-12 NOTE — Telephone Encounter (Signed)
FAXED PT MEDICAL RECORDS TO [facility] ORDERED SCANS ON CD AND SLIDES TO BE FEDEX'ED [facility] WILL CONTACT PT WITH APPT.

## 2015-11-12 NOTE — Telephone Encounter (Signed)
Gave and printed appt sched and avs for pt for June °

## 2015-11-12 NOTE — Progress Notes (Signed)
Hematology and Oncology Follow Up Visit  Margaret Elliott [id number] 01/17/1951    11/12/2015   Principle Diagnosis: 64-year-old woman diagnosed with colon cancer in June 2014. She had presented with an abdominal pain and found to have a cecal mass resulting in obstruction and appendicitis. She had Likely has stage IV disease with a liver mass.    Prior Therapy:  She is status post laparoscopic laparotomy, evacuation of a pelvic abscess and right hemicolectomy with ileocolonic anastomosis done on December 09, 2012. The pathology showed an invasive, well- differentiated colorectal adenocarcinoma. Tumor invades through the muscularis propria, with 2/18 lymph nodes involved, with the pathological staging T3 N1b disease. Her tumor was found to be microsatellite stable. KRAS wild type.   She is also status post Port-A-Cath insertion that was done on 01/05/2013.  FOLFOX chemotherapy started 01/18/2013. Avastin to be added with cycle 2 on 02/01/2013. She is S/P 12 cycles completed in 05/2013.   She is a status post microwave ablation of metastatic lesion in the posterior segment of the right lobe of the liver completed on 09/28/2014 and repeated on 11/30/2014.   Current therapy: Observation and surveillance.  Interim History:  [name] presents today for a followup visit. Since her last visit, she reports doing very well without any complaints. She denies any abdominal pain, early satiety or change in her bowels. She has regained all activities of daily living without any decline. He is able to  work full time without any decline. Does not report any pelvic pain. Does not report any bony pain. Her performance status and activity level remains excellent. She is a very little residual complications related to chemotherapy. She still have some grade 1 neuropathy but not interfering with function.  She is not report any headaches, blurry vision, syncope or seizures. She does not report any fevers, chills or  sweats. She does not report any chest pain, palpitation or orthopnea. She does not report any wheezing, hemoptysis or hematemesis. She does not report any frequency urgency or hesitancy. Does not report any lymphadenopathy or petechiae. Rest of her review of systems unremarkable.   Medications: I have reviewed the patient's current medications.  Current Outpatient Prescriptions  Medication Sig Dispense Refill  . acetaminophen (TYLENOL) 500 MG tablet Take 500 mg by mouth every 6 (six) hours as needed for pain.      . diphenhydrAMINE (BENADRYL) 25 mg capsule Take 25-50 mg by mouth daily as needed for itching. For allergic reaction      . lidocaine-prilocaine (EMLA) cream Apply 1 application topically as needed. Apply approx 1/2 tsp to skin over port, prior to chemotherapy treatments  1 kit  3  . Multiple Vitamin (MULTIVITAMIN) tablet Take 1 tablet by mouth daily.      . ondansetron (ZOFRAN) 8 MG tablet Take 1 tablet (8 mg total) by mouth every 8 (eight) hours as needed for nausea.  30 tablet  1   No current facility-administered medications for this visit.     Allergies: No Known Allergies  Past Medical History, Surgical history, Social history, and Family History were reviewed and updated.    Physical Exam:  Blood pressure 160/89, pulse 87, temperature 98.2 F (36.8 C), temperature source Oral, resp. rate 16, height 5' 4" (1.626 m), weight 176 lb 11.2 oz (80.151 kg), SpO2 100 %.  ECOG: 0 General appearance: Well-appearing woman without distress. Head: Normocephalic, without obvious abnormality. No oral thrush noted. Neck: no adenopathy.  Lymph nodes: Cervical, supraclavicular, and axillary nodes normal.   Heart:regular rate and rhythm, S1, S2 normal, no murmur, click, rub or gallop Lung:chest clear, no wheezing, rales, normal symmetric air entry Abdomen: soft, non-tender, without masses or nodularity noted. No shifting dullness or hepatosplenomegaly. EXT:no erythema, induration, or  nodules Neurological: No deficits noted. No neurological deficits.  CBC    Component Value Date/Time   WBC 4.2 11/08/2015 0845   WBC 7.3 12/01/2014 0448   WBC 8.6 11/10/2011 1329   RBC 4.20 11/08/2015 0845   RBC 3.59* 12/01/2014 0448   RBC 3.95* 11/10/2011 1329   HGB 12.9 11/08/2015 0845   HGB 11.3* 12/01/2014 0448   HGB 11.3* 11/10/2011 1329   HCT 38.9 11/08/2015 0845   HCT 33.8* 12/01/2014 0448   HCT 35.8* 11/10/2011 1329   PLT 236 11/08/2015 0845   PLT 216 12/01/2014 0448   MCV 92.5 11/08/2015 0845   MCV 94.2 12/01/2014 0448   MCV 90.7 11/10/2011 1329   MCH 30.6 11/08/2015 0845   MCH 31.5 12/01/2014 0448   MCH 28.6 11/10/2011 1329   MCHC 33.1 11/08/2015 0845   MCHC 33.4 12/01/2014 0448   MCHC 31.6* 11/10/2011 1329   RDW 13.4 11/08/2015 0845   RDW 13.5 12/01/2014 0448   LYMPHSABS 1.7 11/08/2015 0845   LYMPHSABS 1.7 11/30/2014 0710   MONOABS 0.4 11/08/2015 0845   MONOABS 0.4 11/30/2014 0710   EOSABS 0.1 11/08/2015 0845   EOSABS 0.2 11/30/2014 0710   BASOSABS 0.0 11/08/2015 0845   BASOSABS 0.0 11/30/2014 0710   Results for Margaret Elliott, Margaret Elliott (MRN [id number]) as of 11/12/2015 08:55  Ref. Range 09/09/2015 08:14 11/08/2015 08:45  CEA Latest Ref Range: 0.0-4.7 ng/mL 18.1 (HH) 45.8 (H)   CLINICAL DATA: Subsequent treatment strategy for colorectal cancer diagnosed in June 2014. Status post right hemicolectomy, completion of chemotherapy in 2014 and micro wave ablation of metastatic right hepatic lesion on 09/28/2014 and 11/30/2014.  EXAM: NUCLEAR MEDICINE PET SKULL BASE TO THIGH  TECHNIQUE: 8.6 mCi F-18 FDG was injected intravenously. Full-ring PET imaging was performed from the skull base to thigh after the radiotracer. CT data was obtained and used for attenuation correction and anatomic localization.  FASTING BLOOD GLUCOSE: Value: 95 mg/dl  COMPARISON: PET-CT 07/10/2015. Abdominal pelvic CT 07/18/2015.  FINDINGS: NECK  No hypermetabolic cervical  lymph nodes are identified.There are no lesions of the pharyngeal mucosal space.  CHEST  There are no hypermetabolic mediastinal, hilar or axillary lymph nodes. No suspicious pulmonary activity. There are no suspicious pulmonary nodules. Mild emphysema and bibasilar atelectasis are noted.  ABDOMEN/PELVIS  There is no recurrent suspicious activity within the liver. The ablated lesion posteriorly in the right lobe demonstrates decreased metabolic activity. There is no abnormal activity with spleen, pancreas or adrenal glands. Right renal cyst appears unchanged. There is no hypermetabolic nodal activity. However, there is progressive hypermetabolic peritoneal metastatic disease. 1.9 cm nodule adjacent to the splenic flexure of the colon on image 99 and has an SUV max of 8.7. There is a 2.0 cm right omental nodule on image 117 which has an SUV max of 9.7. There is increased periumbilical hypermetabolic soft tissue nodularity with a component measuring up to 2.0 cm on image 119. In addition, there are hypermetabolic adnexal lesions bilaterally. Right adnexal lesion measures 4.1 x 2.7 cm on image 153 neck demonstrates increased metabolic activity within its posterior soft tissue component (SUV max 9.4). Left adnexal lesion measures 4.6 x 3.8 cm with hypermetabolic activity within its posterior soft tissue component (SUV max 18.2). No generalized ascites.  SKELETON  There is no hypermetabolic activity to suggest osseous metastatic disease.  IMPRESSION: 1. Progressive omental/peritoneal and periumbilical anterior abdominal wall metastatic disease. 2. 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 3. No evidence of recurrent hepatic metastatic disease. No evidence of disease within the neck or chest.   Impression and Plan:  65 year old woman with the following issues:  1. Advanced Colon cancer. She presented with a large  right cecal mass causing obstruction and abscess and appendicitis. After surgical resection she received 12 cycles of chemotherapy with PET/CT scan on 09/12/2013 showed no residual disease actually in the liver that is hypermetabolic.  She is status post liver directed therapy for metastatic disease and to the liver stream and was given in June 2016.  Her PET/CT scan on 11/08/2015 was reviewed today and showed progressive omental and peritoneal metastasis. Her CEA is elevated as well. Options of therapy were discussed today which include systemic chemotherapy utilizing CPT-11, 5-FU, EGFR monoclonal antibody given her K-ras wild-type status. Alternatively, we can consider surgical resection which includes peritoneal resection, oophorectomy and possible HIPEC (hyperthermic intraperitoneal chemotherapy).   After discussion today she is willing to consider at least surgical consultation which we will make the appropriate referral to [facility] surgical oncology division. I would also obtain tissue biopsy of her retroperitoneal tumor and send it for Foundation one testing for future use.  She will follow-up after consultation with surgical oncology to discuss her decision whether to pursue chemotherapy versus surgery.  She still an excellent health and shape with excellent performance status and would like to remain aggressive in her approach to treating this cancer.   2. Intravenous access. PAC in place without complications. This was flushed recently and likely will be utilized for future chemotherapy.  3. 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  4. Followup: Will be in 3-4 weeks to discuss the next option.   [facility] MD 11/12/2015

## 2015-11-19 ENCOUNTER — Other Ambulatory Visit: Payer: Self-pay | Admitting: General Surgery

## 2015-11-19 ENCOUNTER — Other Ambulatory Visit: Payer: Self-pay | Admitting: Physician Assistant

## 2015-11-20 ENCOUNTER — Ambulatory Visit (HOSPITAL_COMMUNITY)
Admission: RE | Admit: 2015-11-20 | Discharge: 2015-11-20 | Disposition: A | Discharge status: Home / Self Care | Payer: Medicare Other | Source: Ambulatory Visit | Attending: Oncology | Admitting: Oncology

## 2015-11-20 ENCOUNTER — Encounter (HOSPITAL_COMMUNITY): Payer: Self-pay

## 2015-11-20 ENCOUNTER — Telehealth: Payer: Self-pay | Admitting: Oncology

## 2015-11-20 DIAGNOSIS — Z87891 Personal history of nicotine dependence: Secondary | ICD-10-CM | POA: Diagnosis not present

## 2015-11-20 DIAGNOSIS — Z9221 Personal history of antineoplastic chemotherapy: Secondary | ICD-10-CM | POA: Insufficient documentation

## 2015-11-20 DIAGNOSIS — Z85038 Personal history of other malignant neoplasm of large intestine: Secondary | ICD-10-CM | POA: Insufficient documentation

## 2015-11-20 DIAGNOSIS — C786 Secondary malignant neoplasm of retroperitoneum and peritoneum: Secondary | ICD-10-CM | POA: Insufficient documentation

## 2015-11-20 DIAGNOSIS — C189 Malignant neoplasm of colon, unspecified: Secondary | ICD-10-CM | POA: Diagnosis not present

## 2015-11-20 DIAGNOSIS — Z9049 Acquired absence of other specified parts of digestive tract: Secondary | ICD-10-CM | POA: Insufficient documentation

## 2015-11-20 LAB — CBC
HCT: 35.5 % — ABNORMAL LOW (ref 36.0–46.0)
HEMOGLOBIN: 12.1 g/dL (ref 12.0–15.0)
MCH: 30.6 pg (ref 26.0–34.0)
MCHC: 34.1 g/dL (ref 30.0–36.0)
MCV: 89.9 fL (ref 78.0–100.0)
PLATELETS: 271 10*3/uL (ref 150–400)
RBC: 3.95 MIL/uL (ref 3.87–5.11)
RDW: 13.2 % (ref 11.5–15.5)
WBC: 4 10*3/uL (ref 4.0–10.5)

## 2015-11-20 LAB — APTT: aPTT: 41 seconds — ABNORMAL HIGH (ref 24–37)

## 2015-11-20 LAB — PROTIME-INR
INR: 1 (ref 0.00–1.49)
Prothrombin Time: 13.4 seconds (ref 11.6–15.2)

## 2015-11-20 MED ORDER — HEPARIN SOD (PORK) LOCK FLUSH 100 UNIT/ML IV SOLN
500.0000 [IU] | INTRAVENOUS | Status: AC | PRN
  Administered 2015-11-20: 500 [IU]
  Filled 2015-11-20: qty 5

## 2015-11-20 MED ORDER — MIDAZOLAM HCL 2 MG/2ML IJ SOLN
INTRAMUSCULAR | Status: AC | PRN
  Administered 2015-11-20 (×4): 1 mg via INTRAVENOUS

## 2015-11-20 MED ORDER — SODIUM CHLORIDE 0.9% FLUSH
10.0000 mL | Freq: Once | INTRAVENOUS | Status: AC
  Administered 2015-11-20: 10 mL via INTRAVENOUS

## 2015-11-20 MED ORDER — MIDAZOLAM HCL 2 MG/2ML IJ SOLN
INTRAMUSCULAR | Status: AC
  Filled 2015-11-20: qty 6

## 2015-11-20 MED ORDER — FENTANYL CITRATE (PF) 100 MCG/2ML IJ SOLN
INTRAMUSCULAR | Status: AC
  Filled 2015-11-20: qty 4

## 2015-11-20 MED ORDER — SODIUM CHLORIDE 0.9 % IV SOLN
INTRAVENOUS | Status: DC
  Administered 2015-11-20: 08:00:00 via INTRAVENOUS

## 2015-11-20 MED ORDER — FENTANYL CITRATE (PF) 100 MCG/2ML IJ SOLN
INTRAMUSCULAR | Status: AC | PRN
  Administered 2015-11-20: 50 ug via INTRAVENOUS
  Administered 2015-11-20: 25 ug via INTRAVENOUS

## 2015-11-20 NOTE — Procedures (Signed)
Successful RLQ MESENTERIC IMPLANT CORE BX NO COMP STABLE FULL REPORT IN PACS PATH PENDING

## 2015-11-20 NOTE — Telephone Encounter (Signed)
Disregard telephone note on 11/12/15

## 2015-11-20 NOTE — Discharge Instructions (Signed)
Needle Biopsy, Care After °These instructions give you information about caring for yourself after your procedure. Your doctor may also give you more specific instructions. Call your doctor if you have any problems or questions after your procedure. °HOME CARE °· Rest as told by your doctor. °· Take medicines only as told by your doctor. °· There are many different ways to close and cover the biopsy site, including stitches (sutures), skin glue, and adhesive strips. Follow instructions from your doctor about: °¨ How to take care of your biopsy site. °¨ When and how you should change your bandage (dressing). °¨ When you should remove your dressing. °¨ Removing whatever was used to close your biopsy site. °· Check your biopsy site every day for signs of infection. Watch for: °¨ Redness, swelling, or pain. °¨ Fluid, blood, or pus. °GET HELP IF: °· You have a fever. °· You have redness, swelling, or pain at the biopsy site, and it lasts longer than a few days. °· You have fluid, blood, or pus coming from the biopsy site. °· You feel sick to your stomach (nauseous). °· You throw up (vomit). °GET HELP RIGHT AWAY IF: °· You are short of breath. °· You have trouble breathing. °· Your chest hurts. °· You feel dizzy or you pass out (faint). °· You have bleeding that does not stop with pressure or a bandage. °· You cough up blood. °· Your belly (abdomen) hurts. °  °This information is not intended to replace advice given to you by your health care provider. Make sure you discuss any questions you have with your health care provider. °  °Document Released: 05/21/2008 Document Revised: 10/23/2014 Document Reviewed: 06/04/2014 °Elsevier Interactive Patient Education ©2016 [employer] °Moderate Conscious Sedation, Adult, Care After °Refer to this sheet in the next few weeks. These instructions provide you with information on caring for yourself after your procedure. Your health care provider may also give you more specific  instructions. Your treatment has been planned according to current medical practices, but problems sometimes occur. Call your health care provider if you have any problems or questions after your procedure. °WHAT TO EXPECT AFTER THE PROCEDURE  °After your procedure: °· You may feel sleepy, clumsy, and have poor balance for several hours. °· Vomiting may occur if you eat too soon after the procedure. °HOME CARE INSTRUCTIONS °· Do not participate in any activities where you could become injured for at least 24 hours. Do not: °· Drive. °· Swim. °· Ride a bicycle. °· Operate heavy machinery. °· Cook. °· Use power tools. °· Climb ladders. °· Work from a high place. °· Do not make important decisions or sign legal documents until you are improved. °· If you vomit, drink water, juice, or soup when you can drink without vomiting. Make sure you have little or no nausea before eating solid foods. °· Only take over-the-counter or prescription medicines for pain, discomfort, or fever as directed by your health care provider. °· Make sure you and your family fully understand everything about the medicines given to you, including what side effects may occur. °· You should not drink alcohol, take sleeping pills, or take medicines that cause drowsiness for at least 24 hours. °· If you smoke, do not smoke without supervision. °· If you are feeling better, you may resume normal activities 24 hours after you were sedated. °· Keep all appointments with your health care provider. °SEEK MEDICAL CARE IF: °· Your skin is pale or bluish in color. °· You   continue to feel nauseous or vomit. °· Your pain is getting worse and is not helped by medicine. °· You have bleeding or swelling. °· You are still sleepy or feeling clumsy after 24 hours. °SEEK IMMEDIATE MEDICAL CARE IF: °· You develop a rash. °· You have difficulty breathing. °· You develop any type of allergic problem. °· You have a fever. °MAKE SURE YOU: °· Understand these  instructions. °· Will watch your condition. °· Will get help right away if you are not doing well or get worse. °  °This information is not intended to replace advice given to you by your health care provider. Make sure you discuss any questions you have with your health care provider. °  °Document Released: 03/29/2013 Document Revised: 06/29/2014 Document Reviewed: 03/29/2013 °Elsevier Interactive Patient Education ©2016 [employer] Inc[employer] ° °

## 2015-11-20 NOTE — H&P (Signed)
Chief Complaint: colon cancer with omental nodule  Referring Physician:Dr. Zola Button  Supervising Physician: Daryll Brod  Patient Status: Out-pt  HPI: Margaret Elliott is an 65 y.o. female who was diagnosed with colon cancer  3 years ago.  She is followed by Dr. Alen Blew.  She has had multiple treatments with chemotherapy.  She has also undergone a Microwave ablation of a liver lesion in April and June of 2016.  She had a recent PET scan that shows progression of her omental and peritoneal/abdominal wall metastatic disease.  She also has new ovarian mets likely.  A request has been made for omental biopsy.  Past Medical History:  Past Medical History  Diagnosis Date  . Anemia   . Cancer [facility])     Colon  . History of chemotherapy     Past Surgical History:  Past Surgical History  Procedure Laterality Date  . Abdominal hysterectomy    . Ganglion cyst excision    . Partial colectomy Right 12/09/2012    Procedure:  Right Hemi-colectomy, Resection of Distal Ileum;  Surgeon: Ralene Ok, MD;  Location: [place];  Service: General;  Laterality: Right;  . Appendectomy N/A 12/09/2012    Procedure: APPENDECTOMY;  Surgeon: Ralene Ok, MD;  Location: [place];  Service: General;  Laterality: N/A;  . Laparoscopy N/A 12/09/2012    Procedure: LAPAROSCOPY DIAGNOSTIC;  Surgeon: Ralene Ok, MD;  Location: [place];  Service: General;  Laterality: N/A;  . Portacath placement Left 01/04/2013    Procedure: INSERTION PORT-A-CATH;  Surgeon: Ralene Ok, MD;  Location: [place];  Service: General;  Laterality: Left;  . Tubal ligation    . Abdominal hysterectomy    . Ablation of liver       09/2014    Family History:  Family History  Problem Relation Age of Onset  . Heart disease Mother   . COPD Mother     Social History:  reports that she quit smoking about 43 years ago. Her smoking use included Cigarettes. She has a 1 pack-year smoking history. She has never used smokeless tobacco.  She reports that she drinks alcohol. She reports that she does not use illicit drugs.  Allergies: No Known Allergies  Medications:   Medication List    ASK your doctor about these medications        acetaminophen 500 MG tablet  Commonly known as:  TYLENOL  Take 500 mg by mouth every 6 (six) hours as needed for pain.     diphenhydrAMINE 25 mg capsule  Commonly known as:  BENADRYL  Take 25-50 mg by mouth daily as needed for itching. For allergic reaction     HYDROcodone-acetaminophen 5-325 MG tablet  Commonly known as:  NORCO/VICODIN  Take 1-2 tablets by mouth every 4 (four) hours as needed for moderate pain.     lidocaine-prilocaine cream  Commonly known as:  EMLA  Apply 1 application topically as needed. Apply approx 1/2 tsp to skin over port, prior to chemotherapy treatments     multivitamin tablet  Take 1 tablet by mouth every morning.        Please HPI for pertinent positives, otherwise complete 10 system ROS negative.  Mallampati Score: MD Evaluation Airway: WNL Heart: WNL Abdomen: WNL Chest/ Lungs: WNL ASA  Classification: 3 Mallampati/Airway Score: Two  Physical Exam: BP 131/85 mmHg  Pulse 86  Temp(Src) 98.1 F (36.7 C) (Oral)  Resp 18  Wt 172 lb 3.2 oz (78.109 kg)  SpO2 100% Body mass  index is 29.54 kg/(m^2). General: pleasant, WD, WN black female who is laying in bed in NAD HEENT: head is normocephalic, atraumatic.  Sclera are noninjected.  PERRL.  Ears and nose without any masses or lesions.  Mouth is pink and moist Heart: regular, rate, and rhythm.  Normal s1,s2. No obvious murmurs, gallops, or rubs noted.  Palpable radial and pedal pulses bilaterally Lungs: CTAB, no wheezes, rhonchi, or rales noted.  Respiratory effort nonlabored.  PAC in left upper chest Abd: soft, NT, ND, +BS, no masses, hernias, or organomegaly MS: all 4 extremities are symmetrical with no cyanosis, clubbing, or edema. Psych: A&Ox3 with an appropriate affect.   Labs: Results  for orders placed or performed during the hospital encounter of 11/20/15 (from the past 48 hour(s))  APTT upon arrival     Status: Abnormal   Collection Time: 11/20/15  7:35 AM  Result Value Ref Range   aPTT 41 (H) 24 - 37 seconds    Comment:        IF BASELINE aPTT IS ELEVATED, SUGGEST PATIENT RISK ASSESSMENT BE USED TO DETERMINE APPROPRIATE ANTICOAGULANT THERAPY.   CBC upon arrival     Status: Abnormal   Collection Time: 11/20/15  7:35 AM  Result Value Ref Range   WBC 4.0 4.0 - 10.5 K/uL   RBC 3.95 3.87 - 5.11 MIL/uL   Hemoglobin 12.1 12.0 - 15.0 g/dL   HCT 35.5 (L) 36.0 - 46.0 %   MCV 89.9 78.0 - 100.0 fL   MCH 30.6 26.0 - 34.0 pg   MCHC 34.1 30.0 - 36.0 g/dL   RDW 13.2 11.5 - 15.5 %   Platelets 271 150 - 400 K/uL  Protime-INR upon arrival     Status: None   Collection Time: 11/20/15  7:35 AM  Result Value Ref Range   Prothrombin Time 13.4 11.6 - 15.2 seconds   INR 1.00 0.00 - 1.49    Imaging: No results found.  Assessment/Plan 1. Stage 4 colon cancer with omental mets -will plan for omental biopsy today to confirm metastatic disease -labs and vitals have been reviewed -Risks and Benefits discussed with the patient including, but not limited to bleeding, infection, damage to adjacent structures or low yield requiring additional tests. All of the patient's questions were answered, patient is agreeable to proceed. Consent signed and in chart.   Thank you for this interesting consult.  I greatly enjoyed meeting Margaret Elliott and look forward to participating in their care.  A copy of this report was sent to the requesting provider on this date.  Electronically Signed: Henreitta Cea 11/20/2015, 8:36 AM   I spent a total of  30 Minutes   in face to face in clinical consultation, greater than 50% of which was counseling/coordinating care for colon cancer with omental nodule

## 2015-12-03 ENCOUNTER — Telehealth: Payer: Self-pay | Admitting: Oncology

## 2015-12-03 NOTE — Telephone Encounter (Signed)
returned call and s.w. pt and r/s appt per pt request due to being out of town....pt ok and aware of new d.t

## 2015-12-06 ENCOUNTER — Ambulatory Visit: Payer: Medicare Other | Admitting: Oncology

## 2015-12-13 ENCOUNTER — Telehealth: Payer: Self-pay | Admitting: Oncology

## 2015-12-13 ENCOUNTER — Ambulatory Visit (HOSPITAL_BASED_OUTPATIENT_CLINIC_OR_DEPARTMENT_OTHER): Payer: Medicare Other | Admitting: Oncology

## 2015-12-13 ENCOUNTER — Telehealth: Payer: Self-pay | Admitting: *Deleted

## 2015-12-13 ENCOUNTER — Other Ambulatory Visit: Payer: Self-pay | Admitting: *Deleted

## 2015-12-13 VITALS — BP 144/81 | HR 85 | Temp 98.2°F | Resp 18 | Ht 64.0 in | Wt 173.3 lb

## 2015-12-13 DIAGNOSIS — C180 Malignant neoplasm of cecum: Secondary | ICD-10-CM | POA: Diagnosis not present

## 2015-12-13 DIAGNOSIS — G609 Hereditary and idiopathic neuropathy, unspecified: Secondary | ICD-10-CM

## 2015-12-13 DIAGNOSIS — C787 Secondary malignant neoplasm of liver and intrahepatic bile duct: Secondary | ICD-10-CM

## 2015-12-13 DIAGNOSIS — C189 Malignant neoplasm of colon, unspecified: Secondary | ICD-10-CM

## 2015-12-13 MED ORDER — LIDOCAINE-PRILOCAINE 2.5-2.5 % EX KIT: 1.0000 "application " | PACK | CUTANEOUS | Status: DC | PRN

## 2015-12-13 MED ORDER — PROCHLORPERAZINE MALEATE 10 MG PO TABS: 10.0000 mg | ORAL_TABLET | Freq: Four times a day (QID) | ORAL | Status: DC | PRN

## 2015-12-13 NOTE — Telephone Encounter (Signed)
Gave pt cal & avs °

## 2015-12-13 NOTE — Progress Notes (Signed)
Hematology and Oncology Follow Up Visit  Margaret Elliott [id number] June 05, 1951    12/13/2015   Principle Diagnosis: 65 year old woman diagnosed with colon cancer in June 2014. She had presented with an abdominal pain and found to have a cecal mass resulting in obstruction and appendicitis. She had Likely has stage IV disease with a liver mass.    Prior Therapy:  She is status post laparoscopic laparotomy, evacuation of a pelvic abscess and right hemicolectomy with ileocolonic anastomosis done on December 09, 2012. The pathology showed an invasive, well- differentiated colorectal adenocarcinoma. Tumor invades through the muscularis propria, with 2/18 lymph nodes involved, with the pathological staging T3 N1b disease. Her tumor was found to be microsatellite stable. KRAS wild type.   She is also status post Port-A-Cath insertion that was done on 01/05/2013.  FOLFOX chemotherapy started 01/18/2013. Avastin to be added with cycle 2 on 02/01/2013. She is S/P 12 cycles completed in 05/2013.   She is a status post microwave ablation of metastatic lesion in the posterior segment of the right lobe of the liver completed on 09/28/2014 and repeated on 11/30/2014.   Current therapy: Under evaluation to start salvage systemic therapy.  Interim History:  Margaret Elliott presents today for a followup visit. Since her last visit, she reports no major changes in her health. She remains very active without any changes in her performance status. She denies any abdominal pain, early satiety or change in her bowels. She has regained all activities of daily living without any decline. He is able to  work full time without any decline. She has no residual complications related to chemotherapy. She still have some grade 1 neuropathy but not interfering with function.  She is not report any headaches, blurry vision, syncope or seizures. She does not report any fevers, chills or sweats. She does not report any chest pain,  palpitation or orthopnea. She does not report any wheezing, hemoptysis or hematemesis. She does not report any frequency urgency or hesitancy. Does not report any lymphadenopathy or petechiae. Rest of her review of systems unremarkable.   Medications: I have reviewed the patient's current medications.  Current Outpatient Prescriptions  Medication Sig Dispense Refill  . acetaminophen (TYLENOL) 500 MG tablet Take 500 mg by mouth every 6 (six) hours as needed for pain.      . diphenhydrAMINE (BENADRYL) 25 mg capsule Take 25-50 mg by mouth daily as needed for itching. For allergic reaction      . lidocaine-prilocaine (EMLA) cream Apply 1 application topically as needed. Apply approx 1/2 tsp to skin over port, prior to chemotherapy treatments  1 kit  3  . Multiple Vitamin (MULTIVITAMIN) tablet Take 1 tablet by mouth daily.      . ondansetron (ZOFRAN) 8 MG tablet Take 1 tablet (8 mg total) by mouth every 8 (eight) hours as needed for nausea.  30 tablet  1   No current facility-administered medications for this visit.     Allergies: No Known Allergies  Past Medical History, Surgical history, Social history, and Family History were reviewed and updated.    Physical Exam:  Blood pressure 144/81, pulse 85, temperature 98.2 F (36.8 C), temperature source Oral, resp. rate 18, height '5\' 4"'$  (1.626 m), weight 173 lb 4.8 oz (78.608 kg), SpO2 100 %.  ECOG: 0 General appearance: Healthy appearing woman appeared comfortable. Head: Normocephalic, without obvious abnormality. No oral ulcers or lesions. Neck: no adenopathy.  Lymph nodes: Cervical, supraclavicular, and axillary nodes normal. Heart:regular rate and rhythm, S1,  S2 normal, no murmur, click, rub or gallop Lung:chest clear, no wheezing, rales, normal symmetric air entry Abdomen: soft, non-tender, without masses or nodularity noted. No shifting dullness or hepatosplenomegaly. EXT:no erythema, induration, or nodules Neurological: No deficits  noted. No neurological deficits.  CBC    Component Value Date/Time   WBC 4.0 11/20/2015 0735   WBC 4.2 11/08/2015 0845   WBC 8.6 11/10/2011 1329   RBC 3.95 11/20/2015 0735   RBC 4.20 11/08/2015 0845   RBC 3.95* 11/10/2011 1329   HGB 12.1 11/20/2015 0735   HGB 12.9 11/08/2015 0845   HGB 11.3* 11/10/2011 1329   HCT 35.5* 11/20/2015 0735   HCT 38.9 11/08/2015 0845   HCT 35.8* 11/10/2011 1329   PLT 271 11/20/2015 0735   PLT 236 11/08/2015 0845   MCV 89.9 11/20/2015 0735   MCV 92.5 11/08/2015 0845   MCV 90.7 11/10/2011 1329   MCH 30.6 11/20/2015 0735   MCH 30.6 11/08/2015 0845   MCH 28.6 11/10/2011 1329   MCHC 34.1 11/20/2015 0735   MCHC 33.1 11/08/2015 0845   MCHC 31.6* 11/10/2011 1329   RDW 13.2 11/20/2015 0735   RDW 13.4 11/08/2015 0845   LYMPHSABS 1.7 11/08/2015 0845   LYMPHSABS 1.7 11/30/2014 0710   MONOABS 0.4 11/08/2015 0845   MONOABS 0.4 11/30/2014 0710   EOSABS 0.1 11/08/2015 0845   EOSABS 0.2 11/30/2014 0710   BASOSABS 0.0 11/08/2015 0845   BASOSABS 0.0 11/30/2014 0710    Impression and Plan:  65 year old woman with the following issues:  1. Advanced Colon cancer. She presented with a large right cecal mass causing obstruction and abscess and appendicitis. After surgical resection she received 12 cycles of chemotherapy with PET/CT scan on 09/12/2013 showed no residual disease actually in the liver that is hypermetabolic.  She is status post liver directed therapy for metastatic disease and to the liver stream and was given in June 2016.  Her PET/CT scan on 11/08/2015 was discussed again. and showed progressive omental and peritoneal metastasis. Marland Kitchen Options of therapy were discussed today which include systemic chemotherapy utilizing CPT-11, 5-FU, EGFR monoclonal antibody given her K-ras wild-type status. Alternatively, we can consider surgical resection which includes peritoneal resection, oophorectomy and possible HIPEC (hyperthermic intraperitoneal chemotherapy).    She has been referred to surgical oncology at [facility] but no appointment was given to the patient. She declined surgery in any case and wants to proceed with chemotherapy. Risks and benefits of this systemic chemotherapy was reviewed again. His complications include nausea, fatigue, myelosuppression, diarrhea and had loss. Complications associated with EGFR monoclonal antibodies include skin rash and infusion related complications. She is willing to proceed at this time and we will schedule her to start next week. She will receive 4 cycles of therapy and repeat imaging studies after that.   2. Intravenous access. PAC in place without complications. EMLA cream will be prescribed to the patient.  3. 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  4. Antiemetics: Compazine was refilled today.  5. Dermatological toxicity: Prophylaxis measures these to be taking including utilizing skin moisturizers and hydrocortisone cream as needed. Oral antibiotics and be also used for acneiform rash. We will continue to monitor closely regarding this issue.  6. Followup: Will be in one week to start first cycle of FOLFIRI and Vectibix.  [facility] MD 12/13/2015

## 2015-12-13 NOTE — Telephone Encounter (Signed)
Called [employer] for prior authorization for emla cream. [id number]. Was approved until sept 2017

## 2015-12-13 NOTE — Telephone Encounter (Signed)
Per staff message and POF I have scheduled appts. Advised scheduler of appts. JMW  

## 2015-12-17 ENCOUNTER — Encounter (HOSPITAL_COMMUNITY): Payer: Self-pay

## 2015-12-17 NOTE — Progress Notes (Signed)
The results of her biopsy obtained on 11/20/2015 including [facility] medicine testing were available today. 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 I plan on discontinuing this therapy and proceed with bevacizumab instead.

## 2015-12-18 ENCOUNTER — Other Ambulatory Visit (HOSPITAL_BASED_OUTPATIENT_CLINIC_OR_DEPARTMENT_OTHER): Payer: Medicare Other

## 2015-12-18 ENCOUNTER — Ambulatory Visit: Payer: Medicare Other

## 2015-12-18 ENCOUNTER — Ambulatory Visit (HOSPITAL_BASED_OUTPATIENT_CLINIC_OR_DEPARTMENT_OTHER): Payer: Medicare Other

## 2015-12-18 VITALS — BP 131/88 | HR 73 | Temp 98.5°F | Resp 17

## 2015-12-18 DIAGNOSIS — Z5112 Encounter for antineoplastic immunotherapy: Secondary | ICD-10-CM

## 2015-12-18 DIAGNOSIS — Z5111 Encounter for antineoplastic chemotherapy: Secondary | ICD-10-CM | POA: Diagnosis not present

## 2015-12-18 DIAGNOSIS — K6389 Other specified diseases of intestine: Secondary | ICD-10-CM

## 2015-12-18 DIAGNOSIS — Z95828 Presence of other vascular implants and grafts: Secondary | ICD-10-CM

## 2015-12-18 DIAGNOSIS — C189 Malignant neoplasm of colon, unspecified: Secondary | ICD-10-CM | POA: Diagnosis not present

## 2015-12-18 DIAGNOSIS — K769 Liver disease, unspecified: Secondary | ICD-10-CM

## 2015-12-18 DIAGNOSIS — C787 Secondary malignant neoplasm of liver and intrahepatic bile duct: Secondary | ICD-10-CM | POA: Diagnosis not present

## 2015-12-18 DIAGNOSIS — C180 Malignant neoplasm of cecum: Secondary | ICD-10-CM

## 2015-12-18 LAB — COMPREHENSIVE METABOLIC PANEL
ALBUMIN: 3.6 g/dL (ref 3.5–5.0)
ALK PHOS: 87 U/L (ref 40–150)
ALT: 12 U/L (ref 0–55)
AST: 15 U/L (ref 5–34)
Anion Gap: 8 mEq/L (ref 3–11)
BILIRUBIN TOTAL: 0.75 mg/dL (ref 0.20–1.20)
BUN: 13.9 mg/dL (ref 7.0–26.0)
CO2: 24 meq/L (ref 22–29)
CREATININE: 1 mg/dL (ref 0.6–1.1)
Calcium: 9.3 mg/dL (ref 8.4–10.4)
Chloride: 107 mEq/L (ref 98–109)
EGFR: 72 mL/min/{1.73_m2} — AB (ref >90)
GLUCOSE: 81 mg/dL (ref 70–140)
Potassium: 3.9 mEq/L (ref 3.5–5.1)
SODIUM: 139 meq/L (ref 136–145)
TOTAL PROTEIN: 7.9 g/dL (ref 6.4–8.3)

## 2015-12-18 LAB — CBC WITH DIFFERENTIAL/PLATELET
BASO%: 0.2 % (ref 0.0–2.0)
Basophils Absolute: 0 10*3/uL (ref 0.0–0.1)
EOS ABS: 0.1 10*3/uL (ref 0.0–0.5)
EOS%: 1.4 % (ref 0.0–7.0)
HCT: 37.5 % (ref 34.8–46.6)
HEMOGLOBIN: 12.6 g/dL (ref 11.6–15.9)
LYMPH%: 36.8 % (ref 14.0–49.7)
MCH: 30.9 pg (ref 25.1–34.0)
MCHC: 33.6 g/dL (ref 31.5–36.0)
MCV: 91.9 fL (ref 79.5–101.0)
MONO#: 0.4 10*3/uL (ref 0.1–0.9)
MONO%: 9.1 % (ref 0.0–14.0)
NEUT%: 52.5 % (ref 38.4–76.8)
NEUTROS ABS: 2.2 10*3/uL (ref 1.5–6.5)
Platelets: 245 10*3/uL (ref 145–400)
RBC: 4.08 10*6/uL (ref 3.70–5.45)
RDW: 13.2 % (ref 11.2–14.5)
WBC: 4.2 10*3/uL (ref 3.9–10.3)
lymph#: 1.5 10*3/uL (ref 0.9–3.3)

## 2015-12-18 LAB — UA PROTEIN, DIPSTICK - CHCC: Protein, ur: NEGATIVE mg/dL

## 2015-12-18 LAB — MAGNESIUM: Magnesium: 2.2 mg/dl (ref 1.5–2.5)

## 2015-12-18 MED ORDER — FLUOROURACIL CHEMO INJECTION 2.5 GM/50ML
400.0000 mg/m2 | Freq: Once | INTRAVENOUS | Status: AC
  Administered 2015-12-18: 750 mg via INTRAVENOUS
  Filled 2015-12-18: qty 15

## 2015-12-18 MED ORDER — LEUCOVORIN CALCIUM INJECTION 350 MG
400.0000 mg/m2 | Freq: Once | INTRAVENOUS | Status: AC
  Administered 2015-12-18: 752 mg via INTRAVENOUS
  Filled 2015-12-18: qty 17.5

## 2015-12-18 MED ORDER — IRINOTECAN HCL CHEMO INJECTION 100 MG/5ML
181.0000 mg/m2 | Freq: Once | INTRAVENOUS | Status: AC
  Administered 2015-12-18: 340 mg via INTRAVENOUS
  Filled 2015-12-18: qty 5.67

## 2015-12-18 MED ORDER — SODIUM CHLORIDE 0.9% FLUSH
10.0000 mL | INTRAVENOUS | Status: DC | PRN
  Administered 2015-12-18: 10 mL via INTRAVENOUS
  Filled 2015-12-18: qty 10

## 2015-12-18 MED ORDER — SODIUM CHLORIDE 0.9 % IV SOLN
Freq: Once | INTRAVENOUS | Status: AC
  Administered 2015-12-18: 14:00:00 via INTRAVENOUS

## 2015-12-18 MED ORDER — ATROPINE SULFATE 1 MG/ML IJ SOLN
INTRAMUSCULAR | Status: AC
  Filled 2015-12-18: qty 1

## 2015-12-18 MED ORDER — SODIUM CHLORIDE 0.9 % IV SOLN
2400.0000 mg/m2 | INTRAVENOUS | Status: DC
  Administered 2015-12-18: 4500 mg via INTRAVENOUS
  Filled 2015-12-18: qty 90

## 2015-12-18 MED ORDER — BEVACIZUMAB CHEMO INJECTION 400 MG/16ML
5.0000 mg/kg | Freq: Once | INTRAVENOUS | Status: AC
  Administered 2015-12-18: 400 mg via INTRAVENOUS
  Filled 2015-12-18: qty 16

## 2015-12-18 MED ORDER — PALONOSETRON HCL INJECTION 0.25 MG/5ML
INTRAVENOUS | Status: AC
  Filled 2015-12-18: qty 5

## 2015-12-18 MED ORDER — ATROPINE SULFATE 1 MG/ML IJ SOLN
0.5000 mg | Freq: Once | INTRAMUSCULAR | Status: AC | PRN
  Administered 2015-12-18: 0.5 mg via INTRAVENOUS

## 2015-12-18 MED ORDER — PALONOSETRON HCL INJECTION 0.25 MG/5ML
0.2500 mg | Freq: Once | INTRAVENOUS | Status: AC
  Administered 2015-12-18: 0.25 mg via INTRAVENOUS

## 2015-12-18 MED ORDER — SODIUM CHLORIDE 0.9 % IV SOLN
10.0000 mg | Freq: Once | INTRAVENOUS | Status: AC
  Administered 2015-12-18: 10 mg via INTRAVENOUS
  Filled 2015-12-18: qty 1

## 2015-12-18 MED ORDER — SODIUM CHLORIDE 0.9% FLUSH
10.0000 mL | INTRAVENOUS | Status: DC | PRN
  Filled 2015-12-18: qty 10

## 2015-12-18 MED ORDER — HEPARIN SOD (PORK) LOCK FLUSH 100 UNIT/ML IV SOLN
500.0000 [IU] | Freq: Once | INTRAVENOUS | Status: DC | PRN
  Filled 2015-12-18: qty 5

## 2015-12-18 NOTE — Patient Instructions (Signed)

## 2015-12-18 NOTE — Patient Instructions (Signed)
[facility] Discharge Instructions for Patients Receiving Chemotherapy  Today you received the following chemotherapy agents Avastin, Irinotecan, Leucovorin, Adrucil  To help prevent nausea and vomiting after your treatment, we encourage you to take your nausea medication as prescribed.   If you develop nausea and vomiting that is not controlled by your nausea medication, call the clinic.   BELOW ARE SYMPTOMS THAT SHOULD BE REPORTED IMMEDIATELY:  *FEVER GREATER THAN 100.5 F  *CHILLS WITH OR WITHOUT FEVER  NAUSEA AND VOMITING THAT IS NOT CONTROLLED WITH YOUR NAUSEA MEDICATION  *UNUSUAL SHORTNESS OF BREATH  *UNUSUAL BRUISING OR BLEEDING  TENDERNESS IN MOUTH AND THROAT WITH OR WITHOUT PRESENCE OF ULCERS  *URINARY PROBLEMS  *BOWEL PROBLEMS  UNUSUAL RASH Items with * indicate a potential emergency and should be followed up as soon as possible.  Feel free to call the clinic you have any questions or concerns. The clinic phone number is [phone].  Please show the [place] at check-in to the Emergency Department and triage nurse. Irinotecan injection What is this medicine? IRINOTECAN (ir in oh TEE kan ) is a chemotherapy drug. It is used to treat colon and rectal cancer. This medicine may be used for other purposes; ask your health care provider or pharmacist if you have questions. What should I tell my health care provider before I take this medicine? They need to know if you have any of these conditions: -blood disorders -dehydration -diarrhea -infection (especially a virus infection such as chickenpox, cold sores, or herpes) -liver disease -low blood counts, like low white cell, platelet, or red cell counts -recent or ongoing radiation therapy -an unusual or allergic reaction to irinotecan, sorbitol, other chemotherapy, other medicines, foods, dyes, or preservatives -pregnant or trying to get pregnant -breast-feeding How should I use this  medicine? This drug is given as an infusion into a vein. It is administered in a hospital or clinic by a specially trained health care professional. Talk to your pediatrician regarding the use of this medicine in children. Special care may be needed. Overdosage: If you think you have taken too much of this medicine contact a poison control center or emergency room at once. NOTE: This medicine is only for you. Do not share this medicine with others. What if I miss a dose? It is important not to miss your dose. Call your doctor or health care professional if you are unable to keep an appointment. What may interact with this medicine? Do not take this medicine with any of the following medications: -atazanavir -certain medicines for fungal infections like itraconazole and ketoconazole -St. John's Wort This medicine may also interact with the following medications: -dexamethasone -diuretics -laxatives -medicines for seizures like carbamazepine, mephobarbital, phenobarbital, phenytoin, primidone -medicines to increase blood counts like filgrastim, pegfilgrastim, sargramostim -prochlorperazine -vaccines This list may not describe all possible interactions. Give your health care provider a list of all the medicines, herbs, non-prescription drugs, or dietary supplements you use. Also tell them if you smoke, drink alcohol, or use illegal drugs. Some items may interact with your medicine. What should I watch for while using this medicine? Your condition will be monitored carefully while you are receiving this medicine. You will need important blood work done while you are taking this medicine. This drug may make you feel generally unwell. This is not uncommon, as chemotherapy can affect healthy cells as well as cancer cells. Report any side effects. Continue your course of treatment even though you feel ill unless your  doctor tells you to stop. In some cases, you may be given additional medicines to  help with side effects. Follow all directions for their use. You may get drowsy or dizzy. Do not drive, use machinery, or do anything that needs mental alertness until you know how this medicine affects you. Do not stand or sit up quickly, especially if you are an older patient. This reduces the risk of dizzy or fainting spells. Call your doctor or health care professional for advice if you get a fever, chills or sore throat, or other symptoms of a cold or flu. Do not treat yourself. This drug decreases your body's ability to fight infections. Try to avoid being around people who are sick. This medicine may increase your risk to bruise or bleed. Call your doctor or health care professional if you notice any unusual bleeding. Be careful brushing and flossing your teeth or using a toothpick because you may get an infection or bleed more easily. If you have any dental work done, tell your dentist you are receiving this medicine. Avoid taking products that contain aspirin, acetaminophen, ibuprofen, naproxen, or ketoprofen unless instructed by your doctor. These medicines may hide a fever. Do not become pregnant while taking this medicine. Women should inform their doctor if they wish to become pregnant or think they might be pregnant. There is a potential for serious side effects to an unborn child. Talk to your health care professional or pharmacist for more information. Do not breast-feed an infant while taking this medicine. What side effects may I notice from receiving this medicine? Side effects that you should report to your doctor or health care professional as soon as possible: -allergic reactions like skin rash, itching or hives, swelling of the face, lips, or tongue -low blood counts - this medicine may decrease the number of white blood cells, red blood cells and platelets. You may be at increased risk for infections and bleeding. -signs of infection - fever or chills, cough, sore throat, pain or  difficulty passing urine -signs of decreased platelets or bleeding - bruising, pinpoint red spots on the skin, black, tarry stools, blood in the urine -signs of decreased red blood cells - unusually weak or tired, fainting spells, lightheadedness -breathing problems -chest pain -diarrhea -feeling faint or lightheaded, falls -flushing, runny nose, sweating during infusion -mouth sores or pain -pain, swelling, redness or irritation where injected -pain, swelling, warmth in the leg -pain, tingling, numbness in the hands or feet -problems with balance, talking, walking -stomach cramps, pain -trouble passing urine or change in the amount of urine -vomiting as to be unable to hold down drinks or food -yellowing of the eyes or skin Side effects that usually do not require medical attention (report to your doctor or health care professional if they continue or are bothersome): -constipation -hair loss -headache -loss of appetite -nausea, vomiting -stomach upset This list may not describe all possible side effects. Call your doctor for medical advice about side effects. You may report side effects to FDA at 1-800-FDA-1088. Where should I keep my medicine? This drug is given in a hospital or clinic and will not be stored at home. NOTE: This sheet is a summary. It may not cover all possible information. If you have questions about this medicine, talk to your doctor, pharmacist, or health care provider.    2016, Elsevier/Gold Standard. (2012-12-05 16:29:32)   Bevacizumab injection What is this medicine? BEVACIZUMAB (be va SIZ yoo mab) is a monoclonal antibody. It is used  to treat cervical cancer, colorectal cancer, glioblastoma multiforme, non-small cell lung cancer (NSCLC), ovarian cancer, and renal cell cancer. This medicine may be used for other purposes; ask your health care provider or pharmacist if you have questions. What should I tell my health care provider before I take this  medicine? They need to know if you have any of these conditions: -blood clots -heart disease, including heart failure, heart attack, or chest pain (angina) -high blood pressure -infection (especially a virus infection such as chickenpox, cold sores, or herpes) -kidney disease -lung disease -prior chemotherapy with doxorubicin, daunorubicin, epirubicin, or other anthracycline type chemotherapy agents -recent or ongoing radiation therapy -recent surgery -stroke -an unusual or allergic reaction to bevacizumab, hamster proteins, mouse proteins, other medicines, foods, dyes, or preservatives -pregnant or trying to get pregnant -breast-feeding How should I use this medicine? This medicine is for infusion into a vein. It is given by a health care professional in a hospital or clinic setting. Talk to your pediatrician regarding the use of this medicine in children. Special care may be needed. Overdosage: If you think you have taken too much of this medicine contact a poison control center or emergency room at once. NOTE: This medicine is only for you. Do not share this medicine with others. What if I miss a dose? It is important not to miss your dose. Call your doctor or health care professional if you are unable to keep an appointment. What may interact with this medicine? Interactions are not expected. This list may not describe all possible interactions. Give your health care provider a list of all the medicines, herbs, non-prescription drugs, or dietary supplements you use. Also tell them if you smoke, drink alcohol, or use illegal drugs. Some items may interact with your medicine. What should I watch for while using this medicine? Your condition will be monitored carefully while you are receiving this medicine. You will need important blood work and urine testing done while you are taking this medicine. During your treatment, let your health care professional know if you have any unusual  symptoms, such as difficulty breathing. This medicine may rarely cause 'gastrointestinal perforation' (holes in the stomach, intestines or colon), a serious side effect requiring surgery to repair. This medicine should be started at least 28 days following major surgery and the site of the surgery should be totally healed. Check with your doctor before scheduling dental work or surgery while you are receiving this treatment. Talk to your doctor if you have recently had surgery or if you have a wound that has not healed. Do not become pregnant while taking this medicine or for 6 months after stopping it. Women should inform their doctor if they wish to become pregnant or think they might be pregnant. There is a potential for serious side effects to an unborn child. Talk to your health care professional or pharmacist for more information. Do not breast-feed an infant while taking this medicine. This medicine has caused ovarian failure in some women. This medicine may interfere with the ability to have a child. You should talk to your doctor or health care professional if you are concerned about your fertility. What side effects may I notice from receiving this medicine? Side effects that you should report to your doctor or health care professional as soon as possible: -allergic reactions like skin rash, itching or hives, swelling of the face, lips, or tongue -signs of infection - fever or chills, cough, sore throat, pain or trouble passing urine -  signs of decreased platelets or bleeding - bruising, pinpoint red spots on the skin, black, tarry stools, nosebleeds, blood in the urine -breathing problems -changes in vision -chest pain -confusion -jaw pain, especially after dental work -mouth sores -seizures -severe abdominal pain -severe headache -sudden numbness or weakness of the face, arm or leg -swelling of legs or ankles -symptoms of a stroke: change in mental awareness, inability to talk or move  one side of the body (especially in patients with lung cancer) -trouble passing urine or change in the amount of urine -trouble speaking or understanding -trouble walking, dizziness, loss of balance or coordination Side effects that usually do not require medical attention (report to your doctor or health care professional if they continue or are bothersome): -constipation -diarrhea -dry skin -headache -loss of appetite -nausea, vomiting This list may not describe all possible side effects. Call your doctor for medical advice about side effects. You may report side effects to FDA at 1-800-FDA-1088. Where should I keep my medicine? This drug is given in a hospital or clinic and will not be stored at home. NOTE: This sheet is a summary. It may not cover all possible information. If you have questions about this medicine, talk to your doctor, pharmacist, or health care provider.    2016, Elsevier/Gold Standard. (2014-08-07 16:58:44)

## 2015-12-19 LAB — CEA: CEA1: 77.5 ng/mL — AB (ref 0.0–4.7)

## 2015-12-20 ENCOUNTER — Ambulatory Visit (HOSPITAL_BASED_OUTPATIENT_CLINIC_OR_DEPARTMENT_OTHER): Payer: Medicare Other

## 2015-12-20 ENCOUNTER — Telehealth: Payer: Self-pay | Admitting: *Deleted

## 2015-12-20 VITALS — BP 125/74 | HR 80 | Temp 98.0°F | Resp 17

## 2015-12-20 DIAGNOSIS — C189 Malignant neoplasm of colon, unspecified: Secondary | ICD-10-CM

## 2015-12-20 DIAGNOSIS — K769 Liver disease, unspecified: Secondary | ICD-10-CM

## 2015-12-20 DIAGNOSIS — K6389 Other specified diseases of intestine: Secondary | ICD-10-CM

## 2015-12-20 DIAGNOSIS — C180 Malignant neoplasm of cecum: Secondary | ICD-10-CM | POA: Diagnosis not present

## 2015-12-20 MED ORDER — HEPARIN SOD (PORK) LOCK FLUSH 100 UNIT/ML IV SOLN
500.0000 [IU] | Freq: Once | INTRAVENOUS | Status: AC | PRN
  Administered 2015-12-20: 500 [IU]
  Filled 2015-12-20: qty 5

## 2015-12-20 MED ORDER — SODIUM CHLORIDE 0.9% FLUSH
10.0000 mL | INTRAVENOUS | Status: DC | PRN
  Administered 2015-12-20: 10 mL
  Filled 2015-12-20: qty 10

## 2015-12-20 NOTE — Telephone Encounter (Signed)
TC from patient stating she has experienced some abdominal cramping this morning though no diarrhea. Also with some nausea. Pt took a compazine with relief though still has a little cramping. Pt is due to come in for 5FU pump d/c today @ 3:30pm. Reinforced use of com[pazine every 6 hours as needed for nausea and to let nurse know how she is feeling @ the time of her pump d/c related to cramping and any diarrhea.  Pt. Voiced understanding.

## 2015-12-20 NOTE — Progress Notes (Signed)
Pt states she called this morning c/o sob, and severe stomach cramps.  Pt was told to take her compazine and let flush nurse know at her afternoon appt,  Pt states she is no longer SOB but cramping comes back every 6 hrs as soon as compazine wears off. Pt does not feel like she needs fluids. Vitals were taken.  Dr Alen Blew is off today so this nurse spoke with Selena Lesser NP. Cyndee would like pt to continue with compazine every 6 hrs as needed, start immodium if she has any diarrhea and call office Monday morning to be seen if symptoms do not improve.  Pt voices understanding. Pt was also given chemo card and told to go to ER if symptoms get any worse over the weekend or if SOB returns.

## 2016-01-01 ENCOUNTER — Telehealth: Payer: Self-pay | Admitting: Oncology

## 2016-01-01 ENCOUNTER — Ambulatory Visit: Payer: Medicare Other

## 2016-01-01 ENCOUNTER — Ambulatory Visit (HOSPITAL_BASED_OUTPATIENT_CLINIC_OR_DEPARTMENT_OTHER): Payer: Medicare Other

## 2016-01-01 ENCOUNTER — Ambulatory Visit (HOSPITAL_BASED_OUTPATIENT_CLINIC_OR_DEPARTMENT_OTHER): Payer: Medicare Other | Admitting: Oncology

## 2016-01-01 ENCOUNTER — Other Ambulatory Visit (HOSPITAL_BASED_OUTPATIENT_CLINIC_OR_DEPARTMENT_OTHER): Payer: Medicare Other

## 2016-01-01 VITALS — BP 139/76 | HR 70 | Temp 98.7°F | Resp 17 | Ht 64.0 in | Wt 170.0 lb

## 2016-01-01 DIAGNOSIS — C779 Secondary and unspecified malignant neoplasm of lymph node, unspecified: Secondary | ICD-10-CM | POA: Diagnosis not present

## 2016-01-01 DIAGNOSIS — C189 Malignant neoplasm of colon, unspecified: Secondary | ICD-10-CM

## 2016-01-01 DIAGNOSIS — R110 Nausea: Secondary | ICD-10-CM | POA: Diagnosis not present

## 2016-01-01 DIAGNOSIS — Z5111 Encounter for antineoplastic chemotherapy: Secondary | ICD-10-CM | POA: Diagnosis not present

## 2016-01-01 DIAGNOSIS — C787 Secondary malignant neoplasm of liver and intrahepatic bile duct: Principal | ICD-10-CM

## 2016-01-01 DIAGNOSIS — Z5112 Encounter for antineoplastic immunotherapy: Secondary | ICD-10-CM

## 2016-01-01 DIAGNOSIS — Z95828 Presence of other vascular implants and grafts: Secondary | ICD-10-CM | POA: Insufficient documentation

## 2016-01-01 DIAGNOSIS — K769 Liver disease, unspecified: Secondary | ICD-10-CM

## 2016-01-01 DIAGNOSIS — G609 Hereditary and idiopathic neuropathy, unspecified: Secondary | ICD-10-CM | POA: Diagnosis not present

## 2016-01-01 DIAGNOSIS — C180 Malignant neoplasm of cecum: Secondary | ICD-10-CM | POA: Diagnosis not present

## 2016-01-01 DIAGNOSIS — K6389 Other specified diseases of intestine: Secondary | ICD-10-CM

## 2016-01-01 LAB — COMPREHENSIVE METABOLIC PANEL
ALT: 11 U/L (ref 0–55)
ANION GAP: 8 meq/L (ref 3–11)
AST: 14 U/L (ref 5–34)
Albumin: 3.2 g/dL — ABNORMAL LOW (ref 3.5–5.0)
Alkaline Phosphatase: 82 U/L (ref 40–150)
BUN: 11.2 mg/dL (ref 7.0–26.0)
CHLORIDE: 108 meq/L (ref 98–109)
CO2: 25 meq/L (ref 22–29)
Calcium: 9.2 mg/dL (ref 8.4–10.4)
Creatinine: 1 mg/dL (ref 0.6–1.1)
EGFR: 70 mL/min/{1.73_m2} — AB (ref >90)
Glucose: 109 mg/dl (ref 70–140)
Potassium: 3.8 mEq/L (ref 3.5–5.1)
Sodium: 141 mEq/L (ref 136–145)
Total Bilirubin: 0.4 mg/dL (ref 0.20–1.20)
Total Protein: 7.3 g/dL (ref 6.4–8.3)

## 2016-01-01 LAB — CBC WITH DIFFERENTIAL/PLATELET
BASO%: 0.6 % (ref 0.0–2.0)
Basophils Absolute: 0 10*3/uL (ref 0.0–0.1)
EOS ABS: 0.1 10*3/uL (ref 0.0–0.5)
EOS%: 2.7 % (ref 0.0–7.0)
HCT: 32.7 % — ABNORMAL LOW (ref 34.8–46.6)
HGB: 11.2 g/dL — ABNORMAL LOW (ref 11.6–15.9)
LYMPH%: 34.2 % (ref 14.0–49.7)
MCH: 31 pg (ref 25.1–34.0)
MCHC: 34.3 g/dL (ref 31.5–36.0)
MCV: 90.6 fL (ref 79.5–101.0)
MONO#: 0.2 10*3/uL (ref 0.1–0.9)
MONO%: 6.5 % (ref 0.0–14.0)
NEUT%: 56 % (ref 38.4–76.8)
NEUTROS ABS: 1.9 10*3/uL (ref 1.5–6.5)
PLATELETS: 223 10*3/uL (ref 145–400)
RBC: 3.61 10*6/uL — AB (ref 3.70–5.45)
RDW: 13 % (ref 11.2–14.5)
WBC: 3.4 10*3/uL — AB (ref 3.9–10.3)
lymph#: 1.2 10*3/uL (ref 0.9–3.3)
nRBC: 0 % (ref 0–0)

## 2016-01-01 MED ORDER — ATROPINE SULFATE 1 MG/ML IJ SOLN
INTRAMUSCULAR | Status: AC
  Filled 2016-01-01: qty 1

## 2016-01-01 MED ORDER — DEXTROSE 5 % IV SOLN
400.0000 mg/m2 | Freq: Once | INTRAVENOUS | Status: AC
  Administered 2016-01-01: 752 mg via INTRAVENOUS
  Filled 2016-01-01: qty 37.6

## 2016-01-01 MED ORDER — IRINOTECAN HCL CHEMO INJECTION 100 MG/5ML
181.0000 mg/m2 | Freq: Once | INTRAVENOUS | Status: AC
  Administered 2016-01-01: 340 mg via INTRAVENOUS
  Filled 2016-01-01: qty 5.67

## 2016-01-01 MED ORDER — SODIUM CHLORIDE 0.9 % IV SOLN
2400.0000 mg/m2 | INTRAVENOUS | Status: DC
  Administered 2016-01-01: 4500 mg via INTRAVENOUS
  Filled 2016-01-01: qty 90

## 2016-01-01 MED ORDER — SODIUM CHLORIDE 0.9 % IV SOLN
Freq: Once | INTRAVENOUS | Status: AC
  Administered 2016-01-01: 10:00:00 via INTRAVENOUS

## 2016-01-01 MED ORDER — FLUOROURACIL CHEMO INJECTION 2.5 GM/50ML
400.0000 mg/m2 | Freq: Once | INTRAVENOUS | Status: AC
  Administered 2016-01-01: 750 mg via INTRAVENOUS
  Filled 2016-01-01: qty 15

## 2016-01-01 MED ORDER — SODIUM CHLORIDE 0.9 % IV SOLN: Freq: Once | INTRAVENOUS | Status: DC

## 2016-01-01 MED ORDER — SODIUM CHLORIDE 0.9 % IV SOLN
5.0000 mg/kg | Freq: Once | INTRAVENOUS | Status: AC
  Administered 2016-01-01: 400 mg via INTRAVENOUS
  Filled 2016-01-01: qty 16

## 2016-01-01 MED ORDER — PALONOSETRON HCL INJECTION 0.25 MG/5ML
0.2500 mg | Freq: Once | INTRAVENOUS | Status: AC
  Administered 2016-01-01: 0.25 mg via INTRAVENOUS

## 2016-01-01 MED ORDER — SODIUM CHLORIDE 0.9 % IJ SOLN
10.0000 mL | INTRAMUSCULAR | Status: DC | PRN
  Administered 2016-01-01: 10 mL via INTRAVENOUS
  Filled 2016-01-01: qty 10

## 2016-01-01 MED ORDER — ATROPINE SULFATE 1 MG/ML IJ SOLN
0.5000 mg | Freq: Once | INTRAMUSCULAR | Status: AC | PRN
  Administered 2016-01-01: 0.5 mg via INTRAVENOUS

## 2016-01-01 MED ORDER — DIPHENOXYLATE-ATROPINE 2.5-0.025 MG PO TABS
1.0000 | ORAL_TABLET | Freq: Four times a day (QID) | ORAL | Status: DC | PRN
Start: 2016-01-01 — End: 2019-06-06

## 2016-01-01 MED ORDER — PALONOSETRON HCL INJECTION 0.25 MG/5ML
INTRAVENOUS | Status: AC
  Filled 2016-01-01: qty 5

## 2016-01-01 MED ORDER — SODIUM CHLORIDE 0.9% FLUSH
10.0000 mL | INTRAVENOUS | Status: DC | PRN
  Filled 2016-01-01: qty 10

## 2016-01-01 MED ORDER — SODIUM CHLORIDE 0.9 % IV SOLN
10.0000 mg | Freq: Once | INTRAVENOUS | Status: AC
  Administered 2016-01-01: 10 mg via INTRAVENOUS
  Filled 2016-01-01: qty 1

## 2016-01-01 MED ORDER — HEPARIN SOD (PORK) LOCK FLUSH 100 UNIT/ML IV SOLN
500.0000 [IU] | Freq: Once | INTRAVENOUS | Status: DC | PRN
  Filled 2016-01-01: qty 5

## 2016-01-01 NOTE — Telephone Encounter (Signed)
GAVE PATIENT AVS REPORT AND APPOINTMENTS FOR July AND AUGUST.

## 2016-01-01 NOTE — Patient Instructions (Signed)
[facility] Discharge Instructions for Patients Receiving Chemotherapy  Today you received the following chemotherapy agents:  Avastin, Leucovorin, Irinotecan, Fluorouracil  To help prevent nausea and vomiting after your treatment, we encourage you to take your nausea medication as prescribed.   If you develop nausea and vomiting that is not controlled by your nausea medication, call the clinic.   BELOW ARE SYMPTOMS THAT SHOULD BE REPORTED IMMEDIATELY:  *FEVER GREATER THAN 100.5 F  *CHILLS WITH OR WITHOUT FEVER  NAUSEA AND VOMITING THAT IS NOT CONTROLLED WITH YOUR NAUSEA MEDICATION  *UNUSUAL SHORTNESS OF BREATH  *UNUSUAL BRUISING OR BLEEDING  TENDERNESS IN MOUTH AND THROAT WITH OR WITHOUT PRESENCE OF ULCERS  *URINARY PROBLEMS  *BOWEL PROBLEMS  UNUSUAL RASH Items with * indicate a potential emergency and should be followed up as soon as possible.  Feel free to call the clinic you have any questions or concerns. The clinic phone number is [phone].  Please show the CHEMO ALERT CARD at check-in to the Emergency Department and triage nurse.   

## 2016-01-01 NOTE — Patient Instructions (Signed)

## 2016-01-01 NOTE — Progress Notes (Signed)
Hematology and Oncology Follow Up Visit  Margaret Elliott [id number] [birth year]    01/01/2016   Principle Diagnosis: 65 year old woman diagnosed with colon cancer in June 2014. She had presented with an abdominal pain and found to have a cecal mass resulting in obstruction and appendicitis. She had Likely has stage IV disease with a liver mass.    Prior Therapy:  She is status post laparoscopic laparotomy, evacuation of a pelvic abscess and right hemicolectomy with ileocolonic anastomosis done on December 09, 2012. The pathology showed an invasive, well- differentiated colorectal adenocarcinoma. Tumor invades through the muscularis propria, with 2/18 lymph nodes involved, with the pathological staging T3 N1b disease. Her tumor was found to be microsatellite stable. KRAS wild type.   She is also status post Port-A-Cath insertion that was done on 01/05/2013.  FOLFOX chemotherapy started 01/18/2013. Avastin to be added with cycle 2 on 02/01/2013. She is S/P 12 cycles completed in 05/2013.   She is a status post microwave ablation of metastatic lesion in the posterior segment of the right lobe of the liver completed on 09/28/2014 and repeated on 11/30/2014.  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   Current therapy: FOLFIRI and a Avastin salvage therapy started on 12/10/2015. She is here for cycle 2 of therapy.  Interim History:  [name] presents today for a followup visit. Since her last visit, she received the first cycle of chemotherapy without major complications. She reported mild nausea and grade 1 diarrhea. These symptoms are manageable with antiemetics and nontender diarrhea medication. She remains functional and attends to activities of daily living. She did have blood per rectum after having recurrent diarrhea which have subsided at this time. She denied any dizziness or headaches. She denied any abdominal pain or  early satiety. Her oral intake due to decreased slightly.   She is not report any headaches, blurry vision, syncope or seizures. She does not report any fevers, chills or sweats. She does not report any chest pain, palpitation or orthopnea. She does not report any wheezing, hemoptysis or hematemesis. She does not report any frequency urgency or hesitancy. Does not report any lymphadenopathy or petechiae. Rest of her review of systems unremarkable.   Medications: I have reviewed the patient's current medications.  Current Outpatient Prescriptions  Medication Sig Dispense Refill  . acetaminophen (TYLENOL) 500 MG tablet Take 500 mg by mouth every 6 (six) hours as needed for pain.      . diphenhydrAMINE (BENADRYL) 25 mg capsule Take 25-50 mg by mouth daily as needed for itching. For allergic reaction      . lidocaine-prilocaine (EMLA) cream Apply 1 application topically as needed. Apply approx 1/2 tsp to skin over port, prior to chemotherapy treatments  1 kit  3  . Multiple Vitamin (MULTIVITAMIN) tablet Take 1 tablet by mouth daily.      . ondansetron (ZOFRAN) 8 MG tablet Take 1 tablet (8 mg total) by mouth every 8 (eight) hours as needed for nausea.  30 tablet  1   No current facility-administered medications for this visit.     Allergies: No Known Allergies  Past Medical History, Surgical history, Social history, and Family History were reviewed and updated.    Physical Exam:  Blood pressure 139/76, pulse 70, temperature 98.7 F (37.1 C), temperature source Oral, resp. rate 17, height _0  (1.626 m), weight 170 lb (77.111 kg), SpO2 100 %.  ECOG: 0 General appearance: Alert, awake woman  without distress. Head: Normocephalic, without obvious abnormality. No oral thrush noted. s. Neck: no adenopathy.  Lymph nodes: Cervical, supraclavicular, and axillary nodes normal. Heart:regular rate and rhythm, S1, S2 normal, no murmur, click, rub or gallop Lung:chest clear, no wheezing, rales,  normal symmetric air entry Abdomen: soft, non-tender, without masses or nodularity noted. No rebound or guarding. EXT:no erythema, induration, or nodules Neurological: No deficits noted. No neurological deficits.  CBC    Component Value Date/Time   WBC 3.4* 01/01/2016 0816   WBC 4.0 11/20/2015 0735   WBC 8.6 11/10/2011 1329   RBC 3.61* 01/01/2016 0816   RBC 3.95 11/20/2015 0735   RBC 3.95* 11/10/2011 1329   HGB 11.2* 01/01/2016 0816   HGB 12.1 11/20/2015 0735   HGB 11.3* 11/10/2011 1329   HCT 32.7* 01/01/2016 0816   HCT 35.5* 11/20/2015 0735   HCT 35.8* 11/10/2011 1329   PLT 223 01/01/2016 0816   PLT 271 11/20/2015 0735   MCV 90.6 01/01/2016 0816   MCV 89.9 11/20/2015 0735   MCV 90.7 11/10/2011 1329   MCH 31.0 01/01/2016 0816   MCH 30.6 11/20/2015 0735   MCH 28.6 11/10/2011 1329   MCHC 34.3 01/01/2016 0816   MCHC 34.1 11/20/2015 0735   MCHC 31.6* 11/10/2011 1329   RDW 13.0 01/01/2016 0816   RDW 13.2 11/20/2015 0735   LYMPHSABS 1.2 01/01/2016 0816   LYMPHSABS 1.7 11/30/2014 0710   MONOABS 0.2 01/01/2016 0816   MONOABS 0.4 11/30/2014 0710   EOSABS 0.1 01/01/2016 0816   EOSABS 0.2 11/30/2014 0710   BASOSABS 0.0 01/01/2016 0816   BASOSABS 0.0 11/30/2014 0710    Impression and Plan:  65 year old woman with the following issues:  1. Advanced Colon cancer. She presented with a large right cecal mass causing obstruction and abscess and appendicitis. After surgical resection she received 12 cycles of chemotherapy with PET/CT scan on 09/12/2013 showed no residual disease actually in the liver that is hypermetabolic.  She is status post liver directed therapy for metastatic disease and to the liver stream and was given in June 2016.  Her PET/CT scan on 11/08/2015 showed progressive omental and peritoneal metastasis. Biopsy proven to be adenocarcinoma of the colon that is KRAS wild-type but NRAS mutated.  She is status post first cycle of salvage FOLFIRI and a Avastin that is  well-tolerated. The plan is to proceed with cycle 2 without any dose reduction or delay.   2. Intravenous access. PAC in place without complications. EMLA cream will be prescribed to the patient.  3. 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  4. Antiemetics: Compazine was refilled today.  5. Abdominal cramping, bleeding per rectum and poor appetite: All related to CPT-11. We'll continue with supportive measures for the time being and consider dose reductions if the symptoms persist or worsen.  6. Followup: Will be in 2 weeks for cycle 3 of chemotherapy.  [facility] MD 01/01/2016

## 2016-01-02 LAB — CEA: CEA: 55.5 ng/mL — ABNORMAL HIGH (ref 0.0–4.7)

## 2016-01-03 ENCOUNTER — Ambulatory Visit (HOSPITAL_BASED_OUTPATIENT_CLINIC_OR_DEPARTMENT_OTHER): Payer: Medicare Other

## 2016-01-03 ENCOUNTER — Ambulatory Visit: Payer: Medicare Other | Admitting: Oncology

## 2016-01-03 VITALS — BP 138/83 | HR 72 | Temp 98.5°F | Resp 18

## 2016-01-03 DIAGNOSIS — Z452 Encounter for adjustment and management of vascular access device: Secondary | ICD-10-CM

## 2016-01-03 DIAGNOSIS — C189 Malignant neoplasm of colon, unspecified: Secondary | ICD-10-CM

## 2016-01-03 DIAGNOSIS — C180 Malignant neoplasm of cecum: Secondary | ICD-10-CM

## 2016-01-03 DIAGNOSIS — K769 Liver disease, unspecified: Secondary | ICD-10-CM

## 2016-01-03 DIAGNOSIS — K6389 Other specified diseases of intestine: Secondary | ICD-10-CM

## 2016-01-03 MED ORDER — SODIUM CHLORIDE 0.9% FLUSH
10.0000 mL | INTRAVENOUS | Status: DC | PRN
  Administered 2016-01-03: 10 mL
  Filled 2016-01-03: qty 10

## 2016-01-03 MED ORDER — HEPARIN SOD (PORK) LOCK FLUSH 100 UNIT/ML IV SOLN
500.0000 [IU] | Freq: Once | INTRAVENOUS | Status: AC | PRN
  Administered 2016-01-03: 500 [IU]
  Filled 2016-01-03: qty 5

## 2016-01-03 NOTE — Patient Instructions (Signed)
Fluorouracil, 5FU; Diclofenac topical cream What is this medicine? FLUOROURACIL; DICLOFENAC (flure oh YOOR a sil; dye KLOE fen ak) is a combination of a topical chemotherapy agent and non-steroidal anti-inflammatory drug (NSAID). It is used on the skin to treat skin cancer and skin conditions that could become cancer. This medicine may be used for other purposes; ask your health care provider or pharmacist if you have questions. What should I tell my health care provider before I take this medicine? They need to know if you have any of these conditions: -bleeding problems -cigarette smoker -DPD enzyme deficiency -heart disease -high blood pressure -if you frequently drink alcohol containing drinks -kidney disease -liver disease -open or infected skin -stomach problems -swelling or open sores at the treatment site -recent or planned coronary artery bypass graft (CABG) surgery -an unusual or allergic reaction to fluorouracil, diclofenac, aspirin, other NSAIDs, other medicines, foods, dyes, or preservatives -pregnant or trying to get pregnant -breast-feeding How should I use this medicine? This medicine is only for use on the skin. Follow the directions on the prescription label. Wash hands before and after use. Wash affected area and gently pat dry. To apply this medicine use a cotton-tipped applicator, or use gloves if applying with fingertips. If applied with unprotected fingertips, it is very important to wash your hands well after you apply this medicine. Avoid applying to the eyes, nose, or mouth. Apply enough medicine to cover the affected area. You can cover the area with a light gauze dressing, but do not use tight or air-tight dressings. Finish the full course prescribed by your doctor or health care professional, even if you think your condition is better. Do not stop taking except on the advice of your doctor or health care professional. Talk to your pediatrician regarding the use of  this medicine in children. Special care may be needed. Overdosage: If you think you have taken too much of this medicine contact a poison control center or emergency room at once. NOTE: This medicine is only for you. Do not share this medicine with others. What if I miss a dose? If you miss a dose, apply it as soon as you can. If it is almost time for your next dose, only use that dose. Do not apply extra doses. Contact your doctor or health care professional if you miss more than one dose. What may interact with this medicine? Interactions are not expected. Do not use any other skin products without telling your doctor or health care professional. This list may not describe all possible interactions. Give your health care provider a list of all the medicines, herbs, non-prescription drugs, or dietary supplements you use. Also tell them if you smoke, drink alcohol, or use illegal drugs. Some items may interact with your medicine. What should I watch for while using this medicine? Visit your doctor or health care professional for checks on your progress. You will need to use this medicine for 2 to 6 weeks. This may be longer depending on the condition being treated. You may not see full healing for another 1 to 2 months after you stop using the medicine. Treated areas of skin can look unsightly during and for several weeks after treatment with this medicine. This medicine can make you more sensitive to the sun. Keep out of the sun. If you cannot avoid being in the sun, wear protective clothing and use sunscreen. Do not use sun lamps or tanning beds/booths. Where should I keep my What side effects may  I notice from receiving this medicine? Side effects that you should report to your doctor or health care professional as soon as possible: -allergic reactions like skin rash, itching or hives, swelling of the face, lips, or tongue -black or bloody stools, blood in the urine or vomit -blurred  vision -chest pain -difficulty breathing or wheezing -redness, blistering, peeling or loosening of the skin, including inside the mouth -severe redness and swelling of normal skin -slurred speech or weakness on one side of the body -trouble passing urine or change in the amount of urine -unexplained weight gain or swelling -unusually weak or tired -yellowing of eyes or skin Side effects that usually do not require medical attention (Report these to your doctor or health care professional if they continue or are bothersome.): -increased sensitivity of the skin to sun and ultraviolet light -pain and burning of the affected area -scaling or swelling of the affected area -skin rash, itching of the affected area -tenderness This list may not describe all possible side effects. Call your doctor for medical advice about side effects. You may report side effects to FDA at 1-800-FDA-1088. Where should I keep my medicine? Keep out of the reach of children. Store at room temperature between 20 and 25 degrees C (68 and 77 degrees F). Throw away any unused medicine after the expiration date. NOTE: This sheet is a summary. It may not cover all possible information. If you have questions about this medicine, talk to your doctor, pharmacist, or health care provider.    2016, [employer]. (2013-10-09 11:09:58)

## 2016-01-15 ENCOUNTER — Telehealth: Payer: Self-pay | Admitting: Oncology

## 2016-01-15 ENCOUNTER — Ambulatory Visit: Payer: Medicare Other

## 2016-01-15 ENCOUNTER — Other Ambulatory Visit (HOSPITAL_BASED_OUTPATIENT_CLINIC_OR_DEPARTMENT_OTHER): Payer: Medicare Other

## 2016-01-15 ENCOUNTER — Ambulatory Visit (HOSPITAL_BASED_OUTPATIENT_CLINIC_OR_DEPARTMENT_OTHER): Payer: Medicare Other

## 2016-01-15 ENCOUNTER — Ambulatory Visit (HOSPITAL_BASED_OUTPATIENT_CLINIC_OR_DEPARTMENT_OTHER): Payer: Medicare Other | Admitting: Oncology

## 2016-01-15 VITALS — BP 124/75

## 2016-01-15 VITALS — BP 145/86 | HR 73 | Temp 98.0°F | Resp 18 | Ht 64.0 in | Wt 170.5 lb

## 2016-01-15 DIAGNOSIS — Z95828 Presence of other vascular implants and grafts: Secondary | ICD-10-CM

## 2016-01-15 DIAGNOSIS — G609 Hereditary and idiopathic neuropathy, unspecified: Secondary | ICD-10-CM

## 2016-01-15 DIAGNOSIS — C180 Malignant neoplasm of cecum: Secondary | ICD-10-CM | POA: Diagnosis not present

## 2016-01-15 DIAGNOSIS — C787 Secondary malignant neoplasm of liver and intrahepatic bile duct: Secondary | ICD-10-CM | POA: Diagnosis not present

## 2016-01-15 DIAGNOSIS — Z5111 Encounter for antineoplastic chemotherapy: Secondary | ICD-10-CM

## 2016-01-15 DIAGNOSIS — C189 Malignant neoplasm of colon, unspecified: Secondary | ICD-10-CM

## 2016-01-15 DIAGNOSIS — C786 Secondary malignant neoplasm of retroperitoneum and peritoneum: Secondary | ICD-10-CM | POA: Diagnosis not present

## 2016-01-15 DIAGNOSIS — Z5112 Encounter for antineoplastic immunotherapy: Secondary | ICD-10-CM | POA: Diagnosis present

## 2016-01-15 DIAGNOSIS — K769 Liver disease, unspecified: Secondary | ICD-10-CM

## 2016-01-15 DIAGNOSIS — D709 Neutropenia, unspecified: Secondary | ICD-10-CM

## 2016-01-15 DIAGNOSIS — K6389 Other specified diseases of intestine: Secondary | ICD-10-CM

## 2016-01-15 LAB — CBC WITH DIFFERENTIAL/PLATELET
BASO%: 0.4 % (ref 0.0–2.0)
Basophils Absolute: 0 10*3/uL (ref 0.0–0.1)
EOS%: 2.6 % (ref 0.0–7.0)
Eosinophils Absolute: 0.1 10*3/uL (ref 0.0–0.5)
HCT: 35.4 % (ref 34.8–46.6)
HEMOGLOBIN: 12 g/dL (ref 11.6–15.9)
LYMPH%: 52.4 % — AB (ref 14.0–49.7)
MCH: 31.2 pg (ref 25.1–34.0)
MCHC: 33.9 g/dL (ref 31.5–36.0)
MCV: 91.9 fL (ref 79.5–101.0)
MONO#: 0.2 10*3/uL (ref 0.1–0.9)
MONO%: 7.8 % (ref 0.0–14.0)
NEUT%: 36.8 % — ABNORMAL LOW (ref 38.4–76.8)
NEUTROS ABS: 1 10*3/uL — AB (ref 1.5–6.5)
PLATELETS: 241 10*3/uL (ref 145–400)
RBC: 3.85 10*6/uL (ref 3.70–5.45)
RDW: 14 % (ref 11.2–14.5)
WBC: 2.7 10*3/uL — AB (ref 3.9–10.3)
lymph#: 1.4 10*3/uL (ref 0.9–3.3)

## 2016-01-15 LAB — COMPREHENSIVE METABOLIC PANEL
ALBUMIN: 3.3 g/dL — AB (ref 3.5–5.0)
ALK PHOS: 88 U/L (ref 40–150)
ALT: 10 U/L (ref 0–55)
AST: 13 U/L (ref 5–34)
Anion Gap: 8 mEq/L (ref 3–11)
BILIRUBIN TOTAL: 0.54 mg/dL (ref 0.20–1.20)
BUN: 15.2 mg/dL (ref 7.0–26.0)
CO2: 25 meq/L (ref 22–29)
CREATININE: 1.1 mg/dL (ref 0.6–1.1)
Calcium: 9 mg/dL (ref 8.4–10.4)
Chloride: 109 mEq/L (ref 98–109)
EGFR: 63 mL/min/{1.73_m2} — ABNORMAL LOW (ref >90)
GLUCOSE: 104 mg/dL (ref 70–140)
Potassium: 4 mEq/L (ref 3.5–5.1)
SODIUM: 142 meq/L (ref 136–145)
TOTAL PROTEIN: 7.4 g/dL (ref 6.4–8.3)

## 2016-01-15 MED ORDER — ATROPINE SULFATE 1 MG/ML IJ SOLN
INTRAMUSCULAR | Status: AC
  Filled 2016-01-15: qty 1

## 2016-01-15 MED ORDER — SODIUM CHLORIDE 0.9 % IJ SOLN
10.0000 mL | INTRAMUSCULAR | Status: DC | PRN
  Administered 2016-01-15: 10 mL via INTRAVENOUS
  Filled 2016-01-15: qty 10

## 2016-01-15 MED ORDER — SODIUM CHLORIDE 0.9 % IV SOLN
5.0000 mg/kg | Freq: Once | INTRAVENOUS | Status: AC
  Administered 2016-01-15: 400 mg via INTRAVENOUS
  Filled 2016-01-15: qty 16

## 2016-01-15 MED ORDER — PALONOSETRON HCL INJECTION 0.25 MG/5ML
INTRAVENOUS | Status: AC
  Filled 2016-01-15: qty 5

## 2016-01-15 MED ORDER — PALONOSETRON HCL INJECTION 0.25 MG/5ML
0.2500 mg | Freq: Once | INTRAVENOUS | Status: AC
  Administered 2016-01-15: 0.25 mg via INTRAVENOUS

## 2016-01-15 MED ORDER — IRINOTECAN HCL CHEMO INJECTION 100 MG/5ML
181.0000 mg/m2 | Freq: Once | INTRAVENOUS | Status: AC
  Administered 2016-01-15: 340 mg via INTRAVENOUS
  Filled 2016-01-15: qty 15

## 2016-01-15 MED ORDER — LEUCOVORIN CALCIUM INJECTION 350 MG
400.0000 mg/m2 | Freq: Once | INTRAVENOUS | Status: AC
  Administered 2016-01-15: 752 mg via INTRAVENOUS
  Filled 2016-01-15: qty 37.6

## 2016-01-15 MED ORDER — ATROPINE SULFATE 1 MG/ML IJ SOLN
0.5000 mg | Freq: Once | INTRAMUSCULAR | Status: AC | PRN
  Administered 2016-01-15: 0.5 mg via INTRAVENOUS

## 2016-01-15 MED ORDER — SODIUM CHLORIDE 0.9 % IV SOLN
2400.0000 mg/m2 | INTRAVENOUS | Status: DC
  Administered 2016-01-15: 4500 mg via INTRAVENOUS
  Filled 2016-01-15: qty 90

## 2016-01-15 MED ORDER — HEPARIN SOD (PORK) LOCK FLUSH 100 UNIT/ML IV SOLN
500.0000 [IU] | Freq: Once | INTRAVENOUS | Status: DC | PRN
  Filled 2016-01-15: qty 5

## 2016-01-15 MED ORDER — FLUOROURACIL CHEMO INJECTION 2.5 GM/50ML
400.0000 mg/m2 | Freq: Once | INTRAVENOUS | Status: AC
  Administered 2016-01-15: 750 mg via INTRAVENOUS
  Filled 2016-01-15: qty 15

## 2016-01-15 MED ORDER — SODIUM CHLORIDE 0.9 % IV SOLN
10.0000 mg | Freq: Once | INTRAVENOUS | Status: AC
  Administered 2016-01-15: 10 mg via INTRAVENOUS
  Filled 2016-01-15: qty 1

## 2016-01-15 MED ORDER — SODIUM CHLORIDE 0.9 % IV SOLN
Freq: Once | INTRAVENOUS | Status: AC
  Administered 2016-01-15: 10:00:00 via INTRAVENOUS

## 2016-01-15 NOTE — Progress Notes (Signed)
WBC 2.7, ANC 1.0, Per Dixie RN per Dr. Alen Blew okay to treat. Pt states that Per Dr.Shadad she will receive Nuelasta injection when she returns to clinic to have her Pump disconnected. Pt educated to avoid large crowds and anyone that may be sick, pt verbalizes understanding.  1227: Blood return noted before and after Adrucil push.

## 2016-01-15 NOTE — Patient Instructions (Signed)
[facility] Discharge Instructions for Patients Receiving Chemotherapy  Today you received the following chemotherapy agents: Avastin, Irinotecan, Leucovorin, Adrucil   To help prevent nausea and vomiting after your treatment, we encourage you to take your nausea medication as directed.    If you develop nausea and vomiting that is not controlled by your nausea medication, call the clinic.   BELOW ARE SYMPTOMS THAT SHOULD BE REPORTED IMMEDIATELY:  *FEVER GREATER THAN 100.5 F  *CHILLS WITH OR WITHOUT FEVER  NAUSEA AND VOMITING THAT IS NOT CONTROLLED WITH YOUR NAUSEA MEDICATION  *UNUSUAL SHORTNESS OF BREATH  *UNUSUAL BRUISING OR BLEEDING  TENDERNESS IN MOUTH AND THROAT WITH OR WITHOUT PRESENCE OF ULCERS  *URINARY PROBLEMS  *BOWEL PROBLEMS  UNUSUAL RASH Items with * indicate a potential emergency and should be followed up as soon as possible.  Feel free to call the clinic you have any questions or concerns. The clinic phone number is [phone].  Please show the CHEMO ALERT CARD at check-in to the Emergency Department and triage nurse.   

## 2016-01-15 NOTE — Telephone Encounter (Signed)
Pt will get updated sched in tx room °

## 2016-01-15 NOTE — Patient Instructions (Signed)

## 2016-01-15 NOTE — Progress Notes (Signed)
Hematology and Oncology Follow Up Visit  Margaret Elliott [id number] 17-Apr-1951    01/15/16   Principle Diagnosis: 65 year old woman diagnosed with colon cancer in June 2014. She had presented with an abdominal pain and found to have a cecal mass resulting in obstruction and appendicitis. She had Likely has stage IV disease with a liver mass.    Prior Therapy:  She is status post laparoscopic laparotomy, evacuation of a pelvic abscess and right hemicolectomy with ileocolonic anastomosis done on December 09, 2012. The pathology showed an invasive, well- differentiated colorectal adenocarcinoma. Tumor invades through the muscularis propria, with 2/18 lymph nodes involved, with the pathological staging T3 N1b disease. Her tumor was found to be microsatellite stable. KRAS wild type.   She is also status post Port-A-Cath insertion that was done on 01/05/2013.  FOLFOX chemotherapy started 01/18/2013. Avastin to be added with cycle 2 on 02/01/2013. She is S/P 12 cycles completed in 05/2013.   She is a status post microwave ablation of metastatic lesion in the posterior segment of the right lobe of the liver completed on 09/28/2014 and repeated on 11/30/2014.  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   Current therapy: FOLFIRI and a Avastin salvage therapy started on 12/10/2015. She is here for cycle 3 of therapy.  Interim History:  [name] presents today for a followup visit. Since her last visit, she tolerated the last chemotherapy without major complications. She reported grade 1 nausea that has been manageable and her diarrhea have subsided as well. Her appetite remains reasonable and maintaining her weight. She remains functional and attends to activities of daily living. She did have blood per rectum after having recurrent diarrhea which have subsided at this time. She denied any dizziness or headaches. She denied any abdominal  pain or early satiety. She denied any infusion-related complications.   She is not report any headaches, blurry vision, syncope or seizures. She does not report any fevers, chills or sweats. She does not report any chest pain, palpitation or orthopnea. She does not report any wheezing, hemoptysis or hematemesis. She does not report any frequency urgency or hesitancy. Does not report any lymphadenopathy or petechiae. Rest of her review of systems unremarkable.   Medications: I have reviewed the patient's current medications.  Current Outpatient Prescriptions  Medication Sig Dispense Refill  . acetaminophen (TYLENOL) 500 MG tablet Take 500 mg by mouth every 6 (six) hours as needed for pain.      . diphenhydrAMINE (BENADRYL) 25 mg capsule Take 25-50 mg by mouth daily as needed for itching. For allergic reaction      . lidocaine-prilocaine (EMLA) cream Apply 1 application topically as needed. Apply approx 1/2 tsp to skin over port, prior to chemotherapy treatments  1 kit  3  . Multiple Vitamin (MULTIVITAMIN) tablet Take 1 tablet by mouth daily.      . ondansetron (ZOFRAN) 8 MG tablet Take 1 tablet (8 mg total) by mouth every 8 (eight) hours as needed for nausea.  30 tablet  1   No current facility-administered medications for this visit.     Allergies: No Known Allergies  Past Medical History, Surgical history, Social history, and Family History were reviewed and updated.    Physical Exam:  Blood pressure (!) 145/86, pulse 73, temperature 98 F (36.7 C), temperature source Oral, resp. rate 18, height '5\' 4"'$  (1.626 m), weight 170 lb 8 oz (77.3 kg), SpO2 100 %.  ECOG: 0  General appearance: Pleasant-appearing woman without distress. Head: Normocephalic, without obvious abnormality. No oral ulcers or lesions. Neck: no adenopathy.  Lymph nodes: Cervical, supraclavicular, and axillary nodes normal. Heart:regular rate and rhythm, S1, S2 normal, no murmur, click, rub or gallop Lung:chest  clear, no wheezing, rales, normal symmetric air entry no dullness to percussion. Abdomen: soft, non-tender, without masses or nodularity noted. No ascites or shifting dullness. EXT:no erythema, induration, or nodules Neurological: No deficits noted. No neurological deficits.  CBC    Component Value Date/Time   WBC 2.7 (L) 01/15/2016 0820   WBC 4.0 11/20/2015 0735   RBC 3.85 01/15/2016 0820   RBC 3.95 11/20/2015 0735   HGB 12.0 01/15/2016 0820   HCT 35.4 01/15/2016 0820   PLT 241 01/15/2016 0820   MCV 91.9 01/15/2016 0820   MCH 31.2 01/15/2016 0820   MCH 30.6 11/20/2015 0735   MCHC 33.9 01/15/2016 0820   MCHC 34.1 11/20/2015 0735   RDW 14.0 01/15/2016 0820   LYMPHSABS 1.4 01/15/2016 0820   MONOABS 0.2 01/15/2016 0820   EOSABS 0.1 01/15/2016 0820   BASOSABS 0.0 01/15/2016 0820   Results for [name], Elliott (MRN [id number]) as of 01/15/2016 08:53  Ref. Range 09/09/2015 08:14 11/08/2015 08:45 12/18/2015 12:31 01/01/2016 08:16  CEA Latest Ref Range: 0.0 - 4.7 ng/mL 18.1 (HH) 45.8 (H) 77.5 (H) 55.5 (H)   Impression and Plan:  65 year old woman with the following issues:   1. Advanced Colon cancer. She presented with a large right cecal mass causing obstruction and abscess and appendicitis. After surgical resection she received 12 cycles of chemotherapy with PET/CT scan on 09/12/2013 showed no residual disease actually in the liver that is hypermetabolic.  She is status post liver directed therapy for metastatic disease and to the liver stream and was given in June 2016.  Her PET/CT scan on 11/08/2015 showed progressive omental and peritoneal metastasis. Biopsy proven to be adenocarcinoma of the colon that is KRAS wild-type but NRAS mutated.  She is on salvage FOLFIRI and a Avastin that is well-tolerated. The plan is to proceed with cycle 3 without any dose reduction or delay. The plan is to repeat imaging studies after cycle 4 of therapy. Her CEA is declining indicating possible  objective response.   2. Intravenous access. PAC in place without complications. EMLA cream will be prescribed to the patient.  3. Neuropathy: Appears to be stable without worsening symptoms on chemotherapy.  4. Antiemetics: Compazine is available to the patient without any changes.  5. Neutropenia: She will proceed with chemotherapy today as scheduled and she will receive Neulasta after each cycle of therapy.  6. Followup: Will be in 2 weeks for cycle 4 of chemotherapy.  [facility] MD 01/15/16

## 2016-01-16 LAB — CEA: CEA: 96.2 ng/mL — ABNORMAL HIGH (ref 0.0–4.7)

## 2016-01-17 ENCOUNTER — Ambulatory Visit: Payer: Medicare Other

## 2016-01-17 ENCOUNTER — Ambulatory Visit (HOSPITAL_BASED_OUTPATIENT_CLINIC_OR_DEPARTMENT_OTHER): Payer: Medicare Other

## 2016-01-17 VITALS — BP 132/76 | HR 70 | Temp 97.4°F | Resp 16

## 2016-01-17 DIAGNOSIS — K769 Liver disease, unspecified: Secondary | ICD-10-CM

## 2016-01-17 DIAGNOSIS — K6389 Other specified diseases of intestine: Secondary | ICD-10-CM

## 2016-01-17 DIAGNOSIS — C787 Secondary malignant neoplasm of liver and intrahepatic bile duct: Secondary | ICD-10-CM

## 2016-01-17 DIAGNOSIS — C180 Malignant neoplasm of cecum: Secondary | ICD-10-CM | POA: Diagnosis not present

## 2016-01-17 DIAGNOSIS — C189 Malignant neoplasm of colon, unspecified: Secondary | ICD-10-CM

## 2016-01-17 MED ORDER — PEGFILGRASTIM INJECTION 6 MG/0.6ML
6.0000 mg | Freq: Once | SUBCUTANEOUS | Status: AC
  Administered 2016-01-17: 6 mg via SUBCUTANEOUS
  Filled 2016-01-17: qty 0.6

## 2016-01-17 MED ORDER — SODIUM CHLORIDE 0.9% FLUSH
10.0000 mL | INTRAVENOUS | Status: DC | PRN
  Administered 2016-01-17: 10 mL
  Filled 2016-01-17: qty 10

## 2016-01-17 MED ORDER — HEPARIN SOD (PORK) LOCK FLUSH 100 UNIT/ML IV SOLN
500.0000 [IU] | Freq: Once | INTRAVENOUS | Status: AC | PRN
Start: 2016-01-17 — End: 2016-01-17
  Administered 2016-01-17: 500 [IU]
  Filled 2016-01-17: qty 5

## 2016-01-17 NOTE — Progress Notes (Signed)
Injection to be given in Infusion Room

## 2016-01-17 NOTE — Patient Instructions (Signed)
Pegfilgrastim injection What is this medicine? PEGFILGRASTIM (PEG fil gra stim) is a long-acting granulocyte colony-stimulating factor that stimulates the growth of neutrophils, a type of white blood cell important in the body's fight against infection. It is used to reduce the incidence of fever and infection in patients with certain types of cancer who are receiving chemotherapy that affects the bone marrow, and to increase survival after being exposed to high doses of radiation. This medicine may be used for other purposes; ask your health care provider or pharmacist if you have questions. What should I tell my health care provider before I take this medicine? They need to know if you have any of these conditions: -kidney disease -latex allergy -ongoing radiation therapy -sickle cell disease -skin reactions to acrylic adhesives (On-Body Injector only) -an unusual or allergic reaction to pegfilgrastim, filgrastim, other medicines, foods, dyes, or preservatives -pregnant or trying to get pregnant -breast-feeding How should I use this medicine? This medicine is for injection under the skin. If you get this medicine at home, you will be taught how to prepare and give the pre-filled syringe or how to use the On-body Injector. Refer to the patient Instructions for Use for detailed instructions. Use exactly as directed. Take your medicine at regular intervals. Do not take your medicine more often than directed. It is important that you put your used needles and syringes in a special sharps container. Do not put them in a trash can. If you do not have a sharps container, call your pharmacist or healthcare provider to get one. Talk to your pediatrician regarding the use of this medicine in children. While this drug may be prescribed for selected conditions, precautions do apply. Overdosage: If you think you have taken too much of this medicine contact a poison control center or emergency room at  once. NOTE: This medicine is only for you. Do not share this medicine with others. What if I miss a dose? It is important not to miss your dose. Call your doctor or health care professional if you miss your dose. If you miss a dose due to an On-body Injector failure or leakage, a new dose should be administered as soon as possible using a single prefilled syringe for manual use. What may interact with this medicine? Interactions have not been studied. Give your health care provider a list of all the medicines, herbs, non-prescription drugs, or dietary supplements you use. Also tell them if you smoke, drink alcohol, or use illegal drugs. Some items may interact with your medicine. This list may not describe all possible interactions. Give your health care provider a list of all the medicines, herbs, non-prescription drugs, or dietary supplements you use. Also tell them if you smoke, drink alcohol, or use illegal drugs. Some items may interact with your medicine. What should I watch for while using this medicine? You may need blood work done while you are taking this medicine. If you are going to need a MRI, CT scan, or other procedure, tell your doctor that you are using this medicine (On-Body Injector only). What side effects may I notice from receiving this medicine? Side effects that you should report to your doctor or health care professional as soon as possible: -allergic reactions like skin rash, itching or hives, swelling of the face, lips, or tongue -dizziness -fever -pain, redness, or irritation at site where injected -pinpoint red spots on the skin -red or dark-brown urine -shortness of breath or breathing problems -stomach or side pain, or pain   at the shoulder -swelling -tiredness -trouble passing urine or change in the amount of urine Side effects that usually do not require medical attention (report to your doctor or health care professional if they continue or are  bothersome): -bone pain -muscle pain This list may not describe all possible side effects. Call your doctor for medical advice about side effects. You may report side effects to FDA at 1-800-FDA-1088. Where should I keep my medicine? Keep out of the reach of children. Store pre-filled syringes in a refrigerator between 2 and 8 degrees C (36 and 46 degrees F). Do not freeze. Keep in carton to protect from light. Throw away this medicine if it is left out of the refrigerator for more than 48 hours. Throw away any unused medicine after the expiration date. NOTE: This sheet is a summary. It may not cover all possible information. If you have questions about this medicine, talk to your doctor, pharmacist, or health care provider.    2016, Elsevier/Gold Standard. (2014-06-28 14:30:14)  

## 2016-01-23 ENCOUNTER — Telehealth: Payer: Self-pay | Admitting: *Deleted

## 2016-01-23 ENCOUNTER — Other Ambulatory Visit: Payer: Self-pay | Admitting: *Deleted

## 2016-01-23 MED ORDER — HYDROCODONE-ACETAMINOPHEN 10-325 MG PO TABS: 1.0000 | ORAL_TABLET | Freq: Four times a day (QID) | ORAL | 0 refills | Status: DC | PRN

## 2016-01-23 NOTE — Telephone Encounter (Signed)
Spoke with Shane, per dr Alen Blew, script for hydrocodone left at front for patient p/u

## 2016-01-23 NOTE — Telephone Encounter (Signed)
Norco 10-325 q6 PRN. #[id number]

## 2016-01-23 NOTE — Telephone Encounter (Signed)
Script left at front for patient p/u for c/o pain. Patient aware.

## 2016-01-23 NOTE — Telephone Encounter (Signed)
Patient calling to c/o pain radiating from the base of her spine up to her neck. States she received neulasta on Friday for the first time. She took a claritin the day before the shot. Yesterday and today she is in so much discomfort, she can barely walk, is not sleeping, BP 160/98. Is only taking extra strength tylenol and it is not helping. NKDA

## 2016-01-28 ENCOUNTER — Other Ambulatory Visit: Payer: Self-pay | Admitting: Oncology

## 2016-01-28 DIAGNOSIS — C189 Malignant neoplasm of colon, unspecified: Secondary | ICD-10-CM

## 2016-01-29 ENCOUNTER — Other Ambulatory Visit (HOSPITAL_BASED_OUTPATIENT_CLINIC_OR_DEPARTMENT_OTHER): Payer: Medicare Other

## 2016-01-29 ENCOUNTER — Ambulatory Visit (HOSPITAL_BASED_OUTPATIENT_CLINIC_OR_DEPARTMENT_OTHER): Payer: Medicare Other | Admitting: Oncology

## 2016-01-29 ENCOUNTER — Ambulatory Visit (HOSPITAL_BASED_OUTPATIENT_CLINIC_OR_DEPARTMENT_OTHER): Payer: Medicare Other

## 2016-01-29 ENCOUNTER — Telehealth: Payer: Self-pay | Admitting: Oncology

## 2016-01-29 ENCOUNTER — Ambulatory Visit: Payer: Medicare Other

## 2016-01-29 VITALS — BP 141/78

## 2016-01-29 VITALS — BP 158/78 | HR 86 | Temp 98.0°F | Resp 18 | Wt 167.9 lb

## 2016-01-29 DIAGNOSIS — C189 Malignant neoplasm of colon, unspecified: Secondary | ICD-10-CM

## 2016-01-29 DIAGNOSIS — C786 Secondary malignant neoplasm of retroperitoneum and peritoneum: Secondary | ICD-10-CM

## 2016-01-29 DIAGNOSIS — C180 Malignant neoplasm of cecum: Secondary | ICD-10-CM

## 2016-01-29 DIAGNOSIS — K6389 Other specified diseases of intestine: Secondary | ICD-10-CM

## 2016-01-29 DIAGNOSIS — C787 Secondary malignant neoplasm of liver and intrahepatic bile duct: Principal | ICD-10-CM

## 2016-01-29 DIAGNOSIS — Z5112 Encounter for antineoplastic immunotherapy: Secondary | ICD-10-CM

## 2016-01-29 DIAGNOSIS — D709 Neutropenia, unspecified: Secondary | ICD-10-CM | POA: Diagnosis not present

## 2016-01-29 DIAGNOSIS — K769 Liver disease, unspecified: Secondary | ICD-10-CM

## 2016-01-29 DIAGNOSIS — Z79899 Other long term (current) drug therapy: Secondary | ICD-10-CM

## 2016-01-29 DIAGNOSIS — Z5111 Encounter for antineoplastic chemotherapy: Secondary | ICD-10-CM

## 2016-01-29 DIAGNOSIS — Z95828 Presence of other vascular implants and grafts: Secondary | ICD-10-CM

## 2016-01-29 LAB — CBC WITH DIFFERENTIAL/PLATELET
BASO%: 0.3 % (ref 0.0–2.0)
Basophils Absolute: 0 10*3/uL (ref 0.0–0.1)
EOS%: 0.5 % (ref 0.0–7.0)
Eosinophils Absolute: 0.1 10*3/uL (ref 0.0–0.5)
HCT: 35 % (ref 34.8–46.6)
HGB: 11.6 g/dL (ref 11.6–15.9)
LYMPH%: 21.8 % (ref 14.0–49.7)
MCH: 30.9 pg (ref 25.1–34.0)
MCHC: 33.1 g/dL (ref 31.5–36.0)
MCV: 93.3 fL (ref 79.5–101.0)
MONO#: 0.7 10*3/uL (ref 0.1–0.9)
MONO%: 6.2 % (ref 0.0–14.0)
NEUT#: 8 10*3/uL — ABNORMAL HIGH (ref 1.5–6.5)
NEUT%: 71.2 % (ref 38.4–76.8)
Platelets: 137 10*3/uL — ABNORMAL LOW (ref 145–400)
RBC: 3.75 10*6/uL (ref 3.70–5.45)
RDW: 14.9 % — ABNORMAL HIGH (ref 11.2–14.5)
WBC: 11.3 10*3/uL — ABNORMAL HIGH (ref 3.9–10.3)
lymph#: 2.5 10*3/uL (ref 0.9–3.3)

## 2016-01-29 LAB — COMPREHENSIVE METABOLIC PANEL
ALT: 15 U/L (ref 0–55)
AST: 18 U/L (ref 5–34)
Albumin: 3.3 g/dL — ABNORMAL LOW (ref 3.5–5.0)
Alkaline Phosphatase: 118 U/L (ref 40–150)
Anion Gap: 10 mEq/L (ref 3–11)
BUN: 12.3 mg/dL (ref 7.0–26.0)
CHLORIDE: 109 meq/L (ref 98–109)
CO2: 23 meq/L (ref 22–29)
CREATININE: 1.1 mg/dL (ref 0.6–1.1)
Calcium: 9.4 mg/dL (ref 8.4–10.4)
EGFR: 61 mL/min/{1.73_m2} — ABNORMAL LOW (ref >90)
GLUCOSE: 113 mg/dL (ref 70–140)
Potassium: 3.8 mEq/L (ref 3.5–5.1)
SODIUM: 142 meq/L (ref 136–145)
Total Bilirubin: 0.38 mg/dL (ref 0.20–1.20)
Total Protein: 7.4 g/dL (ref 6.4–8.3)

## 2016-01-29 LAB — CEA (IN HOUSE-CHCC): CEA (CHCC-In House): 34.56 ng/mL — ABNORMAL HIGH (ref 0.00–5.00)

## 2016-01-29 LAB — UA PROTEIN, DIPSTICK - CHCC: Protein, ur: NEGATIVE mg/dL

## 2016-01-29 MED ORDER — PALONOSETRON HCL INJECTION 0.25 MG/5ML
INTRAVENOUS | Status: AC
  Filled 2016-01-29: qty 5

## 2016-01-29 MED ORDER — SODIUM CHLORIDE 0.9 % IV SOLN
Freq: Once | INTRAVENOUS | Status: AC
  Administered 2016-01-29: 10:00:00 via INTRAVENOUS

## 2016-01-29 MED ORDER — ATROPINE SULFATE 1 MG/ML IJ SOLN
0.5000 mg | Freq: Once | INTRAMUSCULAR | Status: AC | PRN
  Administered 2016-01-29: 0.5 mg via INTRAVENOUS

## 2016-01-29 MED ORDER — LEUCOVORIN CALCIUM INJECTION 350 MG
400.0000 mg/m2 | Freq: Once | INTRAMUSCULAR | Status: AC
  Administered 2016-01-29: 752 mg via INTRAVENOUS
  Filled 2016-01-29: qty 37.6

## 2016-01-29 MED ORDER — ATROPINE SULFATE 1 MG/ML IJ SOLN
INTRAMUSCULAR | Status: AC
  Filled 2016-01-29: qty 1

## 2016-01-29 MED ORDER — SODIUM CHLORIDE 0.9 % IV SOLN
5.0000 mg/kg | Freq: Once | INTRAVENOUS | Status: AC
  Administered 2016-01-29: 400 mg via INTRAVENOUS
  Filled 2016-01-29: qty 16

## 2016-01-29 MED ORDER — SODIUM CHLORIDE 0.9 % IV SOLN
10.0000 mg | Freq: Once | INTRAVENOUS | Status: AC
  Administered 2016-01-29: 10 mg via INTRAVENOUS
  Filled 2016-01-29: qty 1

## 2016-01-29 MED ORDER — PALONOSETRON HCL INJECTION 0.25 MG/5ML
0.2500 mg | Freq: Once | INTRAVENOUS | Status: AC
  Administered 2016-01-29: 0.25 mg via INTRAVENOUS

## 2016-01-29 MED ORDER — IRINOTECAN HCL CHEMO INJECTION 100 MG/5ML
180.0000 mg/m2 | Freq: Once | INTRAVENOUS | Status: AC
  Administered 2016-01-29: 340 mg via INTRAVENOUS
  Filled 2016-01-29: qty 15

## 2016-01-29 MED ORDER — HYDROCODONE-ACETAMINOPHEN 5-325 MG PO TABS: 1.0000 | ORAL_TABLET | ORAL | 0 refills | Status: DC | PRN

## 2016-01-29 MED ORDER — SODIUM CHLORIDE 0.9 % IJ SOLN
10.0000 mL | INTRAMUSCULAR | Status: DC | PRN
  Administered 2016-01-29: 10 mL via INTRAVENOUS
  Filled 2016-01-29: qty 10

## 2016-01-29 MED ORDER — FLUOROURACIL CHEMO INJECTION 2.5 GM/50ML
400.0000 mg/m2 | Freq: Once | INTRAVENOUS | Status: AC
  Administered 2016-01-29: 750 mg via INTRAVENOUS
  Filled 2016-01-29: qty 15

## 2016-01-29 MED ORDER — FLUOROURACIL CHEMO INJECTION 5 GM/100ML
2400.0000 mg/m2 | INTRAVENOUS | Status: DC
  Administered 2016-01-29: 4500 mg via INTRAVENOUS
  Filled 2016-01-29: qty 90

## 2016-01-29 NOTE — Telephone Encounter (Signed)
Gave tp cal & avs

## 2016-01-29 NOTE — Patient Instructions (Signed)

## 2016-01-29 NOTE — Progress Notes (Signed)
Hematology and Oncology Follow Up Visit  Margaret Elliott [id number] [birth year]    01/29/16   Principle Diagnosis: 65 year old woman diagnosed with colon cancer in June 2014. She had presented with an abdominal pain and found to have a cecal mass resulting in obstruction and appendicitis. She had Likely has stage IV disease with a liver mass.    Prior Therapy:  She is status post laparoscopic laparotomy, evacuation of a pelvic abscess and right hemicolectomy with ileocolonic anastomosis done on December 09, 2012. The pathology showed an invasive, well- differentiated colorectal adenocarcinoma. Tumor invades through the muscularis propria, with 2/18 lymph nodes involved, with the pathological staging T3 N1b disease. Her tumor was found to be microsatellite stable. KRAS wild type.   She is also status post Port-A-Cath insertion that was done on 01/05/2013.  FOLFOX chemotherapy started 01/18/2013. Avastin to be added with cycle 2 on 02/01/2013. She is S/P 12 cycles completed in 05/2013.   She is a status post microwave ablation of metastatic lesion in the posterior segment of the right lobe of the liver completed on 09/28/2014 and repeated on 11/30/2014.  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   Current therapy: FOLFIRI and a Avastin salvage therapy started on 12/10/2015. She is here for cycle 4 of therapy.  Interim History:  Margaret Elliott presents today for a followup visit. Since her last visit, she reports receiving chemotherapy without major complications. Her she was with Neulasta which was poorly tolerated. She reported arthralgias or myalgias rather diffuse and lasted for a few days. She took hydrocodone 10 mg which helps her pain but caused her to be sleepy and lethargic. She is feeling better at this time and ready to proceed with chemotherapy.  She does not report any nausea, vomiting or infusion related complications.  She does report some occasional bloating but the symptoms resolved spontaneously. Her performance status remains excellent and continues to work in between chemotherapy treatments. Her appetite remains reasonable without any decline. She is slowly upset about losing her hair but she remains to have great attitude about her treatment.   She is not report any headaches, blurry vision, syncope or seizures. She does not report any fevers, chills or sweats. She does not report any chest pain, palpitation or orthopnea. She does not report any wheezing, hemoptysis or hematemesis. She does not report any frequency urgency or hesitancy. Does not report any lymphadenopathy or petechiae. Rest of her review of systems unremarkable.   Medications: I have reviewed the patient's current medications.  Current Outpatient Prescriptions  Medication Sig Dispense Refill  . acetaminophen (TYLENOL) 500 MG tablet Take 500 mg by mouth every 6 (six) hours as needed for pain.      . diphenhydrAMINE (BENADRYL) 25 mg capsule Take 25-50 mg by mouth daily as needed for itching. For allergic reaction      . lidocaine-prilocaine (EMLA) cream Apply 1 application topically as needed. Apply approx 1/2 tsp to skin over port, prior to chemotherapy treatments  1 kit  3  . Multiple Vitamin (MULTIVITAMIN) tablet Take 1 tablet by mouth daily.      . ondansetron (ZOFRAN) 8 MG tablet Take 1 tablet (8 mg total) by mouth every 8 (eight) hours as needed for nausea.  30 tablet  1   No current facility-administered medications for this visit.     Allergies: No Known Allergies  Past Medical History, Surgical history, Social history, and Family History were reviewed  and updated.    Physical Exam:  Blood pressure (!) 158/78, pulse 86, temperature 98 F (36.7 C), temperature source Oral, resp. rate 18, weight 167 lb 14.4 oz (76.2 kg), SpO2 100 %.  ECOG: 0 General appearance: Alert, awake woman without distress. Head: Normocephalic,  without obvious abnormality. Sclera anicteric without oral thrush. Neck: no adenopathy.  Lymph nodes: Cervical, supraclavicular, and axillary nodes normal. Heart:regular rate and rhythm, S1, S2 normal, no murmur, click, rub or gallop Lung:chest clear, no wheezing, rales, normal symmetric air entry no dullness to percussion. Abdomen: soft, non-tender, without masses or nodularity noted. No rebound or guarding. EXT:no erythema, induration, or nodules Neurological: No new neurological deficits noted.  CBC    Component Value Date/Time   WBC 11.3 (H) 01/29/2016 0835   WBC 4.0 11/20/2015 0735   RBC 3.75 01/29/2016 0835   RBC 3.95 11/20/2015 0735   HGB 11.6 01/29/2016 0835   HCT 35.0 01/29/2016 0835   PLT 137 (L) 01/29/2016 0835   MCV 93.3 01/29/2016 0835   MCH 30.9 01/29/2016 0835   MCH 30.6 11/20/2015 0735   MCHC 33.1 01/29/2016 0835   MCHC 34.1 11/20/2015 0735   RDW 14.9 (H) 01/29/2016 0835   LYMPHSABS 2.5 01/29/2016 0835   MONOABS 0.7 01/29/2016 0835   EOSABS 0.1 01/29/2016 0835   BASOSABS 0.0 01/29/2016 0835   Results for Margaret Elliott (MRN [id number]) as of 01/29/2016 09:04  Ref. Range 12/18/2015 12:31 01/01/2016 08:16 01/15/2016 08:20  CEA Latest Ref Range: 0.0 - 4.7 ng/mL 77.5 (H) 55.5 (H) 96.2 (H)     Impression and Plan:  65 year old woman with the following issues:   1. Advanced Colon cancer. She presented with a large right cecal mass causing obstruction and abscess and appendicitis. After surgical resection she received 12 cycles of chemotherapy with PET/CT scan on 09/12/2013 showed no residual disease actually in the liver that is hypermetabolic.  She is status post liver directed therapy for metastatic disease and to the liver stream and was given in June 2016.  Her PET/CT scan on 11/08/2015 showed progressive omental and peritoneal metastasis. Biopsy proven to be adenocarcinoma of the colon that is KRAS wild-type but NRAS mutated.  She is on salvage FOLFIRI and a  Avastin that is well-tolerated. The plan is to proceed with cycle 4 without any dose reduction or delay.   CT scan of the chest abdomen and pelvis will be scheduled next week prior to cycle 5 of therapy. She has a good response to therapy, I recommend continuing treatment as is.   2. Intravenous access. PAC in place without complications. EMLA cream will be prescribed to the patient.  3. Neuropathy: No worsening neuropathy noted at this time.  4. Antiemetics: Compazine is available to the patient without any changes.  5. Neutropenia: She will proceed with chemotherapy today as scheduled and will not receive Neulasta with this cycle. She understands however that she not required in future cycles. I gave her a prescription for hydrocodone 5 mg in in case she would require it in the future.  6. Followup: Will be in 2 weeks for cycle 5 of chemotherapy.  [facility] MD 01/29/16

## 2016-01-29 NOTE — Patient Instructions (Signed)
[facility] Discharge Instructions for Patients Receiving Chemotherapy  Today you received the following chemotherapy agents Avastin, Irinotecan, Leucovorin and Adrucil.  To help prevent nausea and vomiting after your treatment, we encourage you to take your nausea medication as directed. NO ZOFRAN FROM 3 DAYS    If you develop nausea and vomiting that is not controlled by your nausea medication, call the clinic.   BELOW ARE SYMPTOMS THAT SHOULD BE REPORTED IMMEDIATELY:  *FEVER GREATER THAN 100.5 F  *CHILLS WITH OR WITHOUT FEVER  NAUSEA AND VOMITING THAT IS NOT CONTROLLED WITH YOUR NAUSEA MEDICATION  *UNUSUAL SHORTNESS OF BREATH  *UNUSUAL BRUISING OR BLEEDING  TENDERNESS IN MOUTH AND THROAT WITH OR WITHOUT PRESENCE OF ULCERS  *URINARY PROBLEMS  *BOWEL PROBLEMS  UNUSUAL RASH Items with * indicate a potential emergency and should be followed up as soon as possible.  Feel free to call the clinic you have any questions or concerns. The clinic phone number is [phone].  Please show the [place] at check-in to the Emergency Department and triage nurse.

## 2016-01-30 LAB — CEA: CEA: 99.6 ng/mL — ABNORMAL HIGH (ref 0.0–4.7)

## 2016-01-31 ENCOUNTER — Ambulatory Visit (HOSPITAL_BASED_OUTPATIENT_CLINIC_OR_DEPARTMENT_OTHER): Payer: Medicare Other

## 2016-01-31 VITALS — BP 138/78 | HR 99 | Temp 98.1°F

## 2016-01-31 DIAGNOSIS — C180 Malignant neoplasm of cecum: Secondary | ICD-10-CM | POA: Diagnosis not present

## 2016-01-31 DIAGNOSIS — C189 Malignant neoplasm of colon, unspecified: Secondary | ICD-10-CM

## 2016-01-31 DIAGNOSIS — K769 Liver disease, unspecified: Secondary | ICD-10-CM

## 2016-01-31 DIAGNOSIS — K6389 Other specified diseases of intestine: Secondary | ICD-10-CM

## 2016-01-31 MED ORDER — SODIUM CHLORIDE 0.9% FLUSH
10.0000 mL | INTRAVENOUS | Status: DC | PRN
  Administered 2016-01-31: 10 mL
  Filled 2016-01-31: qty 10

## 2016-01-31 MED ORDER — HEPARIN SOD (PORK) LOCK FLUSH 100 UNIT/ML IV SOLN
500.0000 [IU] | Freq: Once | INTRAVENOUS | Status: AC | PRN
  Administered 2016-01-31: 500 [IU]
  Filled 2016-01-31: qty 5

## 2016-01-31 NOTE — Patient Instructions (Signed)

## 2016-02-04 ENCOUNTER — Ambulatory Visit (HOSPITAL_COMMUNITY): Payer: Medicare Other

## 2016-02-05 ENCOUNTER — Ambulatory Visit (HOSPITAL_COMMUNITY)
Admission: RE | Admit: 2016-02-05 | Discharge: 2016-02-05 | Disposition: A | Discharge status: Home / Self Care | Payer: Medicare Other | Source: Ambulatory Visit | Attending: Oncology | Admitting: Oncology

## 2016-02-05 ENCOUNTER — Encounter (HOSPITAL_COMMUNITY): Payer: Self-pay

## 2016-02-05 DIAGNOSIS — C786 Secondary malignant neoplasm of retroperitoneum and peritoneum: Secondary | ICD-10-CM | POA: Insufficient documentation

## 2016-02-05 DIAGNOSIS — R1909 Other intra-abdominal and pelvic swelling, mass and lump: Secondary | ICD-10-CM | POA: Insufficient documentation

## 2016-02-05 DIAGNOSIS — Z9889 Other specified postprocedural states: Secondary | ICD-10-CM | POA: Insufficient documentation

## 2016-02-05 DIAGNOSIS — C787 Secondary malignant neoplasm of liver and intrahepatic bile duct: Secondary | ICD-10-CM | POA: Insufficient documentation

## 2016-02-05 DIAGNOSIS — C189 Malignant neoplasm of colon, unspecified: Secondary | ICD-10-CM

## 2016-02-05 MED ORDER — IOPAMIDOL (ISOVUE-300) INJECTION 61%
100.0000 mL | Freq: Once | INTRAVENOUS | Status: AC | PRN
  Administered 2016-02-05: 100 mL via INTRAVENOUS

## 2016-02-12 ENCOUNTER — Ambulatory Visit (HOSPITAL_BASED_OUTPATIENT_CLINIC_OR_DEPARTMENT_OTHER): Payer: Medicare Other

## 2016-02-12 ENCOUNTER — Ambulatory Visit: Payer: Medicare Other

## 2016-02-12 ENCOUNTER — Other Ambulatory Visit (HOSPITAL_BASED_OUTPATIENT_CLINIC_OR_DEPARTMENT_OTHER): Payer: Medicare Other

## 2016-02-12 ENCOUNTER — Other Ambulatory Visit: Payer: Self-pay | Admitting: *Deleted

## 2016-02-12 ENCOUNTER — Ambulatory Visit (HOSPITAL_BASED_OUTPATIENT_CLINIC_OR_DEPARTMENT_OTHER): Payer: Medicare Other | Admitting: Oncology

## 2016-02-12 VITALS — BP 137/76 | HR 85 | Temp 98.2°F | Resp 18 | Ht 64.0 in | Wt 169.8 lb

## 2016-02-12 DIAGNOSIS — K6389 Other specified diseases of intestine: Secondary | ICD-10-CM

## 2016-02-12 DIAGNOSIS — C180 Malignant neoplasm of cecum: Secondary | ICD-10-CM | POA: Diagnosis not present

## 2016-02-12 DIAGNOSIS — C189 Malignant neoplasm of colon, unspecified: Secondary | ICD-10-CM

## 2016-02-12 DIAGNOSIS — Z5112 Encounter for antineoplastic immunotherapy: Secondary | ICD-10-CM

## 2016-02-12 DIAGNOSIS — C786 Secondary malignant neoplasm of retroperitoneum and peritoneum: Secondary | ICD-10-CM

## 2016-02-12 DIAGNOSIS — C787 Secondary malignant neoplasm of liver and intrahepatic bile duct: Secondary | ICD-10-CM

## 2016-02-12 DIAGNOSIS — Z95828 Presence of other vascular implants and grafts: Secondary | ICD-10-CM

## 2016-02-12 DIAGNOSIS — K769 Liver disease, unspecified: Secondary | ICD-10-CM

## 2016-02-12 DIAGNOSIS — Z5111 Encounter for antineoplastic chemotherapy: Secondary | ICD-10-CM | POA: Diagnosis not present

## 2016-02-12 DIAGNOSIS — G609 Hereditary and idiopathic neuropathy, unspecified: Secondary | ICD-10-CM | POA: Diagnosis not present

## 2016-02-12 LAB — CBC WITH DIFFERENTIAL/PLATELET
BASO%: 0.9 % (ref 0.0–2.0)
Basophils Absolute: 0 10*3/uL (ref 0.0–0.1)
EOS ABS: 0.1 10*3/uL (ref 0.0–0.5)
EOS%: 4.2 % (ref 0.0–7.0)
HCT: 32.3 % — ABNORMAL LOW (ref 34.8–46.6)
HGB: 10.6 g/dL — ABNORMAL LOW (ref 11.6–15.9)
LYMPH%: 44.8 % (ref 14.0–49.7)
MCH: 30.5 pg (ref 25.1–34.0)
MCHC: 32.8 g/dL (ref 31.5–36.0)
MCV: 92.8 fL (ref 79.5–101.0)
MONO#: 0.2 10*3/uL (ref 0.1–0.9)
MONO%: 10.8 % (ref 0.0–14.0)
NEUT#: 0.8 10*3/uL — ABNORMAL LOW (ref 1.5–6.5)
NEUT%: 39.3 % (ref 38.4–76.8)
PLATELETS: 243 10*3/uL (ref 145–400)
RBC: 3.48 10*6/uL — AB (ref 3.70–5.45)
RDW: 16.2 % — ABNORMAL HIGH (ref 11.2–14.5)
WBC: 2.1 10*3/uL — ABNORMAL LOW (ref 3.9–10.3)
lymph#: 1 10*3/uL (ref 0.9–3.3)

## 2016-02-12 LAB — CEA (IN HOUSE-CHCC): CEA (CHCC-In House): 41.16 ng/mL — ABNORMAL HIGH (ref 0.00–5.00)

## 2016-02-12 LAB — COMPREHENSIVE METABOLIC PANEL
ALT: 13 U/L (ref 0–55)
ANION GAP: 8 meq/L (ref 3–11)
AST: 17 U/L (ref 5–34)
Albumin: 3.2 g/dL — ABNORMAL LOW (ref 3.5–5.0)
Alkaline Phosphatase: 102 U/L (ref 40–150)
BILIRUBIN TOTAL: 0.74 mg/dL (ref 0.20–1.20)
BUN: 17.8 mg/dL (ref 7.0–26.0)
CHLORIDE: 106 meq/L (ref 98–109)
CO2: 24 meq/L (ref 22–29)
Calcium: 9.2 mg/dL (ref 8.4–10.4)
Creatinine: 1 mg/dL (ref 0.6–1.1)
EGFR: 70 mL/min/{1.73_m2} — AB (ref >90)
Glucose: 106 mg/dl (ref 70–140)
POTASSIUM: 4.1 meq/L (ref 3.5–5.1)
Sodium: 138 mEq/L (ref 136–145)
Total Protein: 7.1 g/dL (ref 6.4–8.3)

## 2016-02-12 MED ORDER — PROCHLORPERAZINE MALEATE 10 MG PO TABS: 10.0000 mg | ORAL_TABLET | Freq: Four times a day (QID) | ORAL | 1 refills | Status: DC | PRN

## 2016-02-12 MED ORDER — SODIUM CHLORIDE 0.9 % IJ SOLN
10.0000 mL | INTRAMUSCULAR | Status: DC | PRN
  Administered 2016-02-12: 10 mL via INTRAVENOUS
  Filled 2016-02-12: qty 10

## 2016-02-12 MED ORDER — PALONOSETRON HCL INJECTION 0.25 MG/5ML
INTRAVENOUS | Status: AC
  Filled 2016-02-12: qty 5

## 2016-02-12 MED ORDER — LEUCOVORIN CALCIUM INJECTION 350 MG
400.0000 mg/m2 | Freq: Once | INTRAMUSCULAR | Status: AC
  Administered 2016-02-12: 752 mg via INTRAVENOUS
  Filled 2016-02-12: qty 37.6

## 2016-02-12 MED ORDER — DEXTROSE 5 % IV SOLN
180.0000 mg/m2 | Freq: Once | INTRAVENOUS | Status: AC
  Administered 2016-02-12: 340 mg via INTRAVENOUS
  Filled 2016-02-12: qty 15

## 2016-02-12 MED ORDER — SODIUM CHLORIDE 0.9 % IV SOLN
Freq: Once | INTRAVENOUS | Status: AC
  Administered 2016-02-12: 10:00:00 via INTRAVENOUS

## 2016-02-12 MED ORDER — SODIUM CHLORIDE 0.9 % IV SOLN
2400.0000 mg/m2 | INTRAVENOUS | Status: DC
  Administered 2016-02-12: 4500 mg via INTRAVENOUS
  Filled 2016-02-12: qty 90

## 2016-02-12 MED ORDER — ATROPINE SULFATE 1 MG/ML IJ SOLN
0.5000 mg | Freq: Once | INTRAMUSCULAR | Status: AC | PRN
  Administered 2016-02-12: 0.5 mg via INTRAVENOUS

## 2016-02-12 MED ORDER — ATROPINE SULFATE 1 MG/ML IJ SOLN
INTRAMUSCULAR | Status: AC
  Filled 2016-02-12: qty 1

## 2016-02-12 MED ORDER — PALONOSETRON HCL INJECTION 0.25 MG/5ML
0.2500 mg | Freq: Once | INTRAVENOUS | Status: AC
Start: 2016-02-12 — End: 2016-02-12
  Administered 2016-02-12: 0.25 mg via INTRAVENOUS

## 2016-02-12 MED ORDER — SODIUM CHLORIDE 0.9 % IV SOLN
10.0000 mg | Freq: Once | INTRAVENOUS | Status: AC
  Administered 2016-02-12: 10 mg via INTRAVENOUS
  Filled 2016-02-12: qty 1

## 2016-02-12 MED ORDER — SODIUM CHLORIDE 0.9 % IV SOLN
5.0000 mg/kg | Freq: Once | INTRAVENOUS | Status: AC
  Administered 2016-02-12: 400 mg via INTRAVENOUS
  Filled 2016-02-12: qty 16

## 2016-02-12 MED ORDER — FLUOROURACIL CHEMO INJECTION 2.5 GM/50ML
400.0000 mg/m2 | Freq: Once | INTRAVENOUS | Status: AC
  Administered 2016-02-12: 750 mg via INTRAVENOUS
  Filled 2016-02-12: qty 15

## 2016-02-12 NOTE — Progress Notes (Signed)
Dr. Alen Blew aware of ANC 0.8. Ok to treat.

## 2016-02-12 NOTE — Progress Notes (Signed)
Hematology and Oncology Follow Up Visit  Margaret Elliott [id number] June 02, 1951    02/12/16   Principle Diagnosis: 65 year old woman diagnosed with colon cancer in June 2014. She had presented with an abdominal pain and found to have a cecal mass resulting in obstruction and appendicitis. She had Likely has stage IV disease with a liver mass.    Prior Therapy:  She is status post laparoscopic laparotomy, evacuation of a pelvic abscess and right hemicolectomy with ileocolonic anastomosis done on December 09, 2012. The pathology showed an invasive, well- differentiated colorectal adenocarcinoma. Tumor invades through the muscularis propria, with 2/18 lymph nodes involved, with the pathological staging T3 N1b disease. Her tumor was found to be microsatellite stable. KRAS wild type.   She is also status post Port-A-Cath insertion that was done on 01/05/2013.  FOLFOX chemotherapy started 01/18/2013. Avastin to be added with cycle 2 on 02/01/2013. She is S/P 12 cycles completed in 05/2013.   She is a status post microwave ablation of metastatic lesion in the posterior segment of the right lobe of the liver completed on 09/28/2014 and repeated on 11/30/2014.  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   Current therapy: FOLFIRI and a Avastin salvage therapy started on 12/10/2015. She is here for cycle 5 of therapy.  Interim History:  Ms. Scheirer presents today for a followup visit. Since her last visit, she needs to do reasonably well. She tolerated the last chemotherapy cycle without major complications. She reports grade 1 nausea that resolved and denied any arthralgias or myalgias. She did not receive Neulasta with the last chemotherapy. Her appetite remained excellent and have gained weight since last visit. She did have some mild diarrhea which is resolved. Her performance status and activity level remain at baseline and she  continues to work full time.  She did report gum bleeding after brushing her teeth which have resolved at this time. She denied any other bleeding episodes such as hematochezia or melena.   She is not report any headaches, blurry vision, syncope or seizures. She does not report any fevers, chills or sweats. She does not report any chest pain, palpitation or orthopnea. She does not report any wheezing, hemoptysis or hematemesis. She does not report any frequency urgency or hesitancy. Does not report any lymphadenopathy or petechiae. Rest of her review of systems unremarkable.   Medications: I have reviewed the patient's current medications.  Current Outpatient Prescriptions  Medication Sig Dispense Refill  . acetaminophen (TYLENOL) 500 MG tablet Take 500 mg by mouth every 6 (six) hours as needed for pain.      . diphenhydrAMINE (BENADRYL) 25 mg capsule Take 25-50 mg by mouth daily as needed for itching. For allergic reaction      . lidocaine-prilocaine (EMLA) cream Apply 1 application topically as needed. Apply approx 1/2 tsp to skin over port, prior to chemotherapy treatments  1 kit  3  . Multiple Vitamin (MULTIVITAMIN) tablet Take 1 tablet by mouth daily.      . ondansetron (ZOFRAN) 8 MG tablet Take 1 tablet (8 mg total) by mouth every 8 (eight) hours as needed for nausea.  30 tablet  1   No current facility-administered medications for this visit.     Allergies: No Known Allergies  Past Medical History, Surgical history, Social history, and Family History were reviewed and updated.    Physical Exam:  Blood pressure 137/76, pulse 85, temperature 98.2 F (36.8 C), temperature source  Oral, resp. rate 18, height '5\' 4"'$  (1.626 m), weight 169 lb 12.8 oz (77 kg), SpO2 100 %.  ECOG: 0 General appearance: Well-appearing woman without distress. Head: Normocephalic, without obvious abnormality. No oral ulcers or lesions. No bleeding noted. Neck: no adenopathy.  Lymph nodes: Cervical,  supraclavicular, and axillary nodes normal. Heart:regular rate and rhythm, S1, S2 normal, no murmur, click, rub or gallop Lung:chest clear, no wheezing, rales, normal symmetric air entry no dullness to percussion. Abdomen: soft, non-tender, without masses or nodularity noted. No shifting dullness or ascites. EXT:no erythema, induration, or nodules Neurological: No new neurological deficits noted.  CBC    Component Value Date/Time   WBC 2.1 (L) 02/12/2016 0831   WBC 4.0 11/20/2015 0735   RBC 3.48 (L) 02/12/2016 0831   RBC 3.95 11/20/2015 0735   HGB 10.6 (L) 02/12/2016 0831   HCT 32.3 (L) 02/12/2016 0831   PLT 243 02/12/2016 0831   MCV 92.8 02/12/2016 0831   MCH 30.5 02/12/2016 0831   MCH 30.6 11/20/2015 0735   MCHC 32.8 02/12/2016 0831   MCHC 34.1 11/20/2015 0735   RDW 16.2 (H) 02/12/2016 0831   LYMPHSABS 1.0 02/12/2016 0831   MONOABS 0.2 02/12/2016 0831   EOSABS 0.1 02/12/2016 0831   BASOSABS 0.0 02/12/2016 0831    CLINICAL DATA:  Followup metastatic colon carcinoma to liver. Ongoing chemotherapy. Previous ablation of right hepatic lobe metastasis.  EXAM: CT CHEST, ABDOMEN AND PELVIS WITHOUT CONTRAST  TECHNIQUE: Multidetector CT imaging of the chest, abdomen and pelvis was performed following the standard protocol without IV contrast.  COMPARISON:  PET-CT on 11/08/15 and AP CT on 07/18/2015  FINDINGS: CT CHEST FINDINGS  Cardiovascular:  No significant abnormality.  Mediastinum/Lymph Nodes: No masses or pathologically enlarged lymph nodes identified.  Lungs/Pleura: 4 mm pulmonary nodule in the anterior left upper lobe on image 74 remains stable compared to previous studies 03/13/2013, consistent with benign etiology. No new or enlarging pulmonary nodules or masses identified. No evidence of pulmonary infiltrate or pleural effusion.  Musculoskeletal/Soft Tissues: No suspicious bone lesions or other significant chest wall abnormality.  CT ABDOMEN AND  PELVIS FINDINGS  Hepatobiliary: Ablation defect in posterior segment of right hepatic lobe remains stable. No liver masses are identified. Gallbladder is unremarkable.  Pancreas: No mass, inflammatory changes, or other significant abnormality identified.  Spleen:  Within normal limits in size and appearance.  Adrenals:  No masses identified.  Kidneys/Urinary Tract: No evidence of masses or hydronephrosis. Benign-appearing right renal cyst again noted.  Stomach/Bowel/Peritoneum: No evidence of bowel obstruction. Descending colonic diverticulosis again seen, without evidence of diverticulitis. Small omental soft tissue nodules are again seen bilaterally, without significant change. Largest nodule seen along the anterior wall the descending colon on image 63/2 measures 2.3 x 2.0 cm compared to 2.2 by 1.8 cm previously. No definite new nodules or masses identified.  Vascular/Lymphatic: No pathologically enlarged lymph nodes identified. No abdominal aortic aneurysm seen.  Reproductive: Prior hysterectomy. Bilateral adnexal masses are seen. The right adnexal mass measures 4.9 x 3.5 cm today compared to 2.7 x 4.1 cm on 11/04/2015. Left adnexal mass measures 5.0 x 3.9 cm on today's study compared to 4.6 x 3.8 cm previously.  Other:  None.  Musculoskeletal:  No suspicious bone lesions identified.  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  No significant change in omental metastatic disease. No evidence of ascites.  Stable post ablation changes in posterior right hepatic lobe. No liver metastases  identified.  No evidence of metastatic disease within the thorax.     Impression and Plan:  65 year old woman with the following issues:   1. Advanced Colon cancer. She presented with a large right cecal mass causing obstruction and abscess and appendicitis. After surgical  resection she received 12 cycles of chemotherapy with PET/CT scan on 09/12/2013 showed no residual disease actually in the liver that is hypermetabolic.  She is status post liver directed therapy for metastatic disease and to the liver stream and was given in June 2016.  Her PET/CT scan on 11/08/2015 showed progressive omental and peritoneal metastasis. Biopsy proven to be adenocarcinoma of the colon that is KRAS wild-type but NRAS mutated.  She is on salvage FOLFIRI and a Avastin that is well-tolerated. CT scan on 02/05/2016 was personally reviewed and discussed with the patient today. Her disease appeared to be stable without any major response. The plan is to continue with the same dose and schedule and repeat imaging studies after 4 cycles. She is agreeable with the current plan at this time.  2. Intravenous access. PAC in place without complications. EMLA cream able to her  and reports no complications.  3. Neuropathy: No worsening neuropathy noted at this time. He continues to function using her upper and lower extremities without issues.  4. Antiemetics: Compazine is available to the patient which was refilled today.  5. Neutropenia: She will receive Neulasta after the current cycle of chemotherapy. Neutropenic precaution was given to the patient clinically reporting any persistent fevers and next week or so.  6. Followup: Will be in 2 weeks for cycle 6 of chemotherapy.  [facility] MD 02/12/16

## 2016-02-12 NOTE — Patient Instructions (Signed)

## 2016-02-12 NOTE — Patient Instructions (Signed)
[facility] Discharge Instructions for Patients Receiving Chemotherapy  Today you received the following chemotherapy agents:  Irinotecan, 5FU, and Leucovorin.  To help prevent nausea and vomiting after your treatment, we encourage you to take your nausea medication as    If you develop nausea and vomiting that is not controlled by your nausea medication, call the clinic.   BELOW ARE SYMPTOMS THAT SHOULD BE REPORTED IMMEDIATELY:  *FEVER GREATER THAN 100.5 F  *CHILLS WITH OR WITHOUT FEVER  NAUSEA AND VOMITING THAT IS NOT CONTROLLED WITH YOUR NAUSEA MEDICATION  *UNUSUAL SHORTNESS OF BREATH  *UNUSUAL BRUISING OR BLEEDING  TENDERNESS IN MOUTH AND THROAT WITH OR WITHOUT PRESENCE OF ULCERS  *URINARY PROBLEMS  *BOWEL PROBLEMS  UNUSUAL RASH Items with * indicate a potential emergency and should be followed up as soon as possible.  Feel free to call the clinic you have any questions or concerns. The clinic phone number is [phone].  Please show the [place] at check-in to the Emergency Department and triage nurse.

## 2016-02-13 LAB — CEA: CEA1: 122.1 ng/mL — AB (ref 0.0–4.7)

## 2016-02-14 ENCOUNTER — Ambulatory Visit (HOSPITAL_BASED_OUTPATIENT_CLINIC_OR_DEPARTMENT_OTHER): Payer: Medicare Other

## 2016-02-14 ENCOUNTER — Telehealth: Payer: Self-pay | Admitting: Oncology

## 2016-02-14 ENCOUNTER — Ambulatory Visit: Payer: Medicare Other

## 2016-02-14 VITALS — BP 128/81 | HR 82 | Temp 98.3°F | Resp 17

## 2016-02-14 DIAGNOSIS — C787 Secondary malignant neoplasm of liver and intrahepatic bile duct: Secondary | ICD-10-CM

## 2016-02-14 DIAGNOSIS — C180 Malignant neoplasm of cecum: Secondary | ICD-10-CM

## 2016-02-14 DIAGNOSIS — Z95828 Presence of other vascular implants and grafts: Secondary | ICD-10-CM

## 2016-02-14 DIAGNOSIS — C189 Malignant neoplasm of colon, unspecified: Secondary | ICD-10-CM

## 2016-02-14 MED ORDER — HEPARIN SOD (PORK) LOCK FLUSH 100 UNIT/ML IV SOLN
500.0000 [IU] | Freq: Once | INTRAVENOUS | Status: AC | PRN
  Administered 2016-02-14: 500 [IU] via INTRAVENOUS
  Filled 2016-02-14: qty 5

## 2016-02-14 MED ORDER — PEGFILGRASTIM INJECTION 6 MG/0.6ML
6.0000 mg | Freq: Once | SUBCUTANEOUS | Status: AC
  Administered 2016-02-14: 6 mg via SUBCUTANEOUS
  Filled 2016-02-14: qty 0.6

## 2016-02-14 MED ORDER — SODIUM CHLORIDE 0.9 % IJ SOLN
10.0000 mL | INTRAMUSCULAR | Status: DC | PRN
  Administered 2016-02-14: 10 mL via INTRAVENOUS
  Filled 2016-02-14: qty 10

## 2016-02-14 NOTE — Patient Instructions (Signed)

## 2016-02-14 NOTE — Telephone Encounter (Signed)
GAVE PATIENT AVS REPORT AND APPOINTMENTS FOR September. PATIENT STOPPED BY SCHEDULING AFTER PUMP D/C.

## 2016-02-26 ENCOUNTER — Ambulatory Visit (HOSPITAL_BASED_OUTPATIENT_CLINIC_OR_DEPARTMENT_OTHER): Payer: Medicare Other | Admitting: Oncology

## 2016-02-26 ENCOUNTER — Telehealth: Payer: Self-pay | Admitting: Oncology

## 2016-02-26 ENCOUNTER — Ambulatory Visit (HOSPITAL_BASED_OUTPATIENT_CLINIC_OR_DEPARTMENT_OTHER): Payer: Medicare Other

## 2016-02-26 ENCOUNTER — Other Ambulatory Visit (HOSPITAL_BASED_OUTPATIENT_CLINIC_OR_DEPARTMENT_OTHER): Payer: Medicare Other

## 2016-02-26 ENCOUNTER — Ambulatory Visit: Payer: Medicare Other

## 2016-02-26 VITALS — BP 128/84 | HR 91 | Temp 98.6°F | Resp 18 | Wt 168.8 lb

## 2016-02-26 DIAGNOSIS — C787 Secondary malignant neoplasm of liver and intrahepatic bile duct: Secondary | ICD-10-CM | POA: Diagnosis not present

## 2016-02-26 DIAGNOSIS — C189 Malignant neoplasm of colon, unspecified: Secondary | ICD-10-CM

## 2016-02-26 DIAGNOSIS — Z95828 Presence of other vascular implants and grafts: Secondary | ICD-10-CM

## 2016-02-26 DIAGNOSIS — Z5112 Encounter for antineoplastic immunotherapy: Secondary | ICD-10-CM

## 2016-02-26 DIAGNOSIS — Z5111 Encounter for antineoplastic chemotherapy: Secondary | ICD-10-CM

## 2016-02-26 DIAGNOSIS — C180 Malignant neoplasm of cecum: Secondary | ICD-10-CM

## 2016-02-26 DIAGNOSIS — C786 Secondary malignant neoplasm of retroperitoneum and peritoneum: Secondary | ICD-10-CM | POA: Diagnosis not present

## 2016-02-26 DIAGNOSIS — K769 Liver disease, unspecified: Secondary | ICD-10-CM

## 2016-02-26 DIAGNOSIS — G609 Hereditary and idiopathic neuropathy, unspecified: Secondary | ICD-10-CM

## 2016-02-26 DIAGNOSIS — K6389 Other specified diseases of intestine: Secondary | ICD-10-CM

## 2016-02-26 LAB — COMPREHENSIVE METABOLIC PANEL
ALBUMIN: 3.3 g/dL — AB (ref 3.5–5.0)
ALK PHOS: 128 U/L (ref 40–150)
ALT: 24 U/L (ref 0–55)
ANION GAP: 11 meq/L (ref 3–11)
AST: 29 U/L (ref 5–34)
BILIRUBIN TOTAL: 0.45 mg/dL (ref 0.20–1.20)
BUN: 14.6 mg/dL (ref 7.0–26.0)
CALCIUM: 9.3 mg/dL (ref 8.4–10.4)
CO2: 23 meq/L (ref 22–29)
CREATININE: 1.1 mg/dL (ref 0.6–1.1)
Chloride: 108 mEq/L (ref 98–109)
EGFR: 61 mL/min/{1.73_m2} — ABNORMAL LOW (ref >90)
Glucose: 109 mg/dl (ref 70–140)
Potassium: 4.1 mEq/L (ref 3.5–5.1)
Sodium: 141 mEq/L (ref 136–145)
TOTAL PROTEIN: 7.4 g/dL (ref 6.4–8.3)

## 2016-02-26 LAB — CBC WITH DIFFERENTIAL/PLATELET
BASO%: 0.3 % (ref 0.0–2.0)
Basophils Absolute: 0.1 10*3/uL (ref 0.0–0.1)
EOS%: 0.4 % (ref 0.0–7.0)
Eosinophils Absolute: 0.1 10*3/uL (ref 0.0–0.5)
HEMATOCRIT: 33.9 % — AB (ref 34.8–46.6)
HGB: 11.1 g/dL — ABNORMAL LOW (ref 11.6–15.9)
LYMPH#: 2 10*3/uL (ref 0.9–3.3)
LYMPH%: 12.1 % — ABNORMAL LOW (ref 14.0–49.7)
MCH: 31.3 pg (ref 25.1–34.0)
MCHC: 32.7 g/dL (ref 31.5–36.0)
MCV: 95.8 fL (ref 79.5–101.0)
MONO#: 0.9 10*3/uL (ref 0.1–0.9)
MONO%: 5.5 % (ref 0.0–14.0)
NEUT%: 81.7 % — ABNORMAL HIGH (ref 38.4–76.8)
NEUTROS ABS: 13.3 10*3/uL — AB (ref 1.5–6.5)
PLATELETS: 190 10*3/uL (ref 145–400)
RBC: 3.54 10*6/uL — ABNORMAL LOW (ref 3.70–5.45)
RDW: 18.3 % — ABNORMAL HIGH (ref 11.2–14.5)
WBC: 16.2 10*3/uL — AB (ref 3.9–10.3)

## 2016-02-26 LAB — CEA (IN HOUSE-CHCC): CEA (CHCC-IN HOUSE): 38.42 ng/mL — AB (ref 0.00–5.00)

## 2016-02-26 MED ORDER — SODIUM CHLORIDE 0.9 % IV SOLN: Freq: Once | INTRAVENOUS | Status: DC

## 2016-02-26 MED ORDER — FLUOROURACIL CHEMO INJECTION 2.5 GM/50ML
400.0000 mg/m2 | Freq: Once | INTRAVENOUS | Status: AC
  Administered 2016-02-26: 750 mg via INTRAVENOUS
  Filled 2016-02-26: qty 15

## 2016-02-26 MED ORDER — LEUCOVORIN CALCIUM INJECTION 350 MG
400.0000 mg/m2 | Freq: Once | INTRAVENOUS | Status: AC
  Administered 2016-02-26: 752 mg via INTRAVENOUS
  Filled 2016-02-26: qty 37.6

## 2016-02-26 MED ORDER — SODIUM CHLORIDE 0.9 % IV SOLN
10.0000 mg | Freq: Once | INTRAVENOUS | Status: AC
  Administered 2016-02-26: 10 mg via INTRAVENOUS
  Filled 2016-02-26: qty 1

## 2016-02-26 MED ORDER — SODIUM CHLORIDE 0.9 % IV SOLN
Freq: Once | INTRAVENOUS | Status: AC
  Administered 2016-02-26: 10:00:00 via INTRAVENOUS

## 2016-02-26 MED ORDER — ATROPINE SULFATE 1 MG/ML IJ SOLN
0.5000 mg | Freq: Once | INTRAMUSCULAR | Status: AC | PRN
  Administered 2016-02-26: 0.5 mg via INTRAVENOUS

## 2016-02-26 MED ORDER — SODIUM CHLORIDE 0.9 % IV SOLN
2400.0000 mg/m2 | INTRAVENOUS | Status: DC
  Administered 2016-02-26: 4500 mg via INTRAVENOUS
  Filled 2016-02-26: qty 90

## 2016-02-26 MED ORDER — SODIUM CHLORIDE 0.9 % IJ SOLN
10.0000 mL | INTRAMUSCULAR | Status: DC | PRN
  Administered 2016-02-26: 10 mL via INTRAVENOUS
  Filled 2016-02-26: qty 10

## 2016-02-26 MED ORDER — IRINOTECAN HCL CHEMO INJECTION 100 MG/5ML
180.0000 mg/m2 | Freq: Once | INTRAVENOUS | Status: AC
  Administered 2016-02-26: 340 mg via INTRAVENOUS
  Filled 2016-02-26: qty 2

## 2016-02-26 MED ORDER — PALONOSETRON HCL INJECTION 0.25 MG/5ML
0.2500 mg | Freq: Once | INTRAVENOUS | Status: AC
  Administered 2016-02-26: 0.25 mg via INTRAVENOUS

## 2016-02-26 MED ORDER — ATROPINE SULFATE 1 MG/ML IJ SOLN
INTRAMUSCULAR | Status: AC
  Filled 2016-02-26: qty 1

## 2016-02-26 MED ORDER — PALONOSETRON HCL INJECTION 0.25 MG/5ML
INTRAVENOUS | Status: AC
  Filled 2016-02-26: qty 5

## 2016-02-26 MED ORDER — SODIUM CHLORIDE 0.9 % IV SOLN
5.0000 mg/kg | Freq: Once | INTRAVENOUS | Status: AC
  Administered 2016-02-26: 400 mg via INTRAVENOUS
  Filled 2016-02-26: qty 16

## 2016-02-26 NOTE — Telephone Encounter (Signed)
GAVE PATIENT AVS REPORT AND APPOINTMENTS FOR September AND October  °

## 2016-02-26 NOTE — Progress Notes (Signed)
Hematology and Oncology Follow Up Visit  Margaret Elliott [id number] [birth year]    02/26/16   Principle Diagnosis: 65 year old woman diagnosed with colon cancer in June 2014. She had presented with an abdominal pain and found to have a cecal mass resulting in obstruction and appendicitis. She had Likely has stage IV disease with a liver mass.    Prior Therapy:  She is status post laparoscopic laparotomy, evacuation of a pelvic abscess and right hemicolectomy with ileocolonic anastomosis done on December 09, 2012. The pathology showed an invasive, well- differentiated colorectal adenocarcinoma. Tumor invades through the muscularis propria, with 2/18 lymph nodes involved, with the pathological staging T3 N1b disease. Her tumor was found to be microsatellite stable. KRAS wild type.   She is also status post Port-A-Cath insertion that was done on 01/05/2013.  FOLFOX chemotherapy started 01/18/2013. Avastin to be added with cycle 2 on 02/01/2013. She is S/P 12 cycles completed in 05/2013.   She is a status post microwave ablation of metastatic lesion in the posterior segment of the right lobe of the liver completed on 09/28/2014 and repeated on 11/30/2014.  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   Current therapy: FOLFIRI and a Avastin salvage therapy started on 12/10/2015. She is here for cycle 6 of therapy.  Interim History:  [name] presents today for a followup visit. Since her last visit, she tolerated the last cycle of chemotherapy with less complications. She reports grade 1 diarrhea a few days after chemotherapy that is manageable. She did not report any nausea or vomiting. She denied any abdominal pain or gross satiety. She received Neulasta injection with few arthralgias but with Claritin and oxycodone the symptoms are manageable. He denied any infusion-related complications. She denied any hand-foot irritation or  erythema.  Despite chemotherapy, she continues to work full time and Mrs. very little work. She continues to attend to her activities of daily living without any decline.   She is not report any headaches, blurry vision, syncope or seizures. She does not report any fevers, chills or sweats. She does not report any chest pain, palpitation or orthopnea. She does not report any wheezing, hemoptysis or hematemesis. She does not report any frequency urgency or hesitancy. Does not report any lymphadenopathy or petechiae. Rest of her review of systems unremarkable.   Medications: I have reviewed the patient's current medications.  Current Outpatient Prescriptions  Medication Sig Dispense Refill  . acetaminophen (TYLENOL) 500 MG tablet Take 500 mg by mouth every 6 (six) hours as needed for pain.      . diphenhydrAMINE (BENADRYL) 25 mg capsule Take 25-50 mg by mouth daily as needed for itching. For allergic reaction      . lidocaine-prilocaine (EMLA) cream Apply 1 application topically as needed. Apply approx 1/2 tsp to skin over port, prior to chemotherapy treatments  1 kit  3  . Multiple Vitamin (MULTIVITAMIN) tablet Take 1 tablet by mouth daily.      . ondansetron (ZOFRAN) 8 MG tablet Take 1 tablet (8 mg total) by mouth every 8 (eight) hours as needed for nausea.  30 tablet  1   No current facility-administered medications for this visit.     Allergies: No Known Allergies  Past Medical History, Surgical history, Social history, and Family History were reviewed and updated.    Physical Exam:  Blood pressure 128/84, pulse 91, temperature 98.6 F (37 C), temperature source Oral, resp. rate 18, weight 168  lb 12.8 oz (76.6 kg), SpO2 100 %.  ECOG: 0 General appearance:  Alert, awake woman appeared without distress. Head: Normocephalic, without obvious abnormality. No oral thrush or bleeding noted. Neck: no adenopathy. Thyroid masses. Lymph nodes: Cervical, supraclavicular, and axillary nodes  normal. Heart:regular rate and rhythm, S1, S2 normal, no murmur, click, rub or gallop Lung:chest clear, no wheezing, rales, normal symmetric air entry no dullness to percussion. Chest wall examination: Revealed Port-A-Cath site without any erythema or induration. Abdomen: soft, non-tender, without masses or nodularity noted. No shifting dullness or ascites. EXT:no erythema, induration, or nodules Neurological: No new neurological deficits noted.  CBC    Component Value Date/Time   WBC 16.2 (H) 02/26/2016 0811   WBC 4.0 11/20/2015 0735   RBC 3.54 (L) 02/26/2016 0811   RBC 3.95 11/20/2015 0735   HGB 11.1 (L) 02/26/2016 0811   HCT 33.9 (L) 02/26/2016 0811   PLT 190 02/26/2016 0811   MCV 95.8 02/26/2016 0811   MCH 31.3 02/26/2016 0811   MCH 30.6 11/20/2015 0735   MCHC 32.7 02/26/2016 0811   MCHC 34.1 11/20/2015 0735   RDW 18.3 (H) 02/26/2016 0811   LYMPHSABS 2.0 02/26/2016 0811   MONOABS 0.9 02/26/2016 0811   EOSABS 0.1 02/26/2016 0811   BASOSABS 0.1 02/26/2016 0811      Impression and Plan:  65 year old woman with the following issues:   1. Advanced Colon cancer. She presented with a large right cecal mass causing obstruction and abscess and appendicitis. After surgical resection she received 12 cycles of chemotherapy with PET/CT scan on 09/12/2013 showed no residual disease actually in the liver that is hypermetabolic.  She is status post liver directed therapy for metastatic disease and to the liver stream and was given in June 2016.  Her PET/CT scan on 11/08/2015 showed progressive omental and peritoneal metastasis. Biopsy proven to be adenocarcinoma of the colon that is KRAS wild-type but NRAS mutated.  She is on salvage FOLFIRI and a Avastin that is well-tolerated. CT scan on 02/05/2016 showed stable disease and the plan is to continue with the same dose and schedule of this current regimen.  She have tolerated the last chemotherapy without any dose reduction or delay  in the plan is to proceed with cycle 6 today as scheduled. We'll repeat imaging studies after cycle 8.  2. Intravenous access. Port-A-Cath is in place without complications. There was some difficulty accessing it today but was successful without any pain or bleeding. We'll continue to monitor the status of this Port-A-Cath moving forward.  3. Neuropathy: No worsening neuropathy noted at this time. He continues to function using her upper and lower extremities without issues.  4. Antiemetics: Compazine is available to her without any breakthrough nausea or vomiting.  5. Neutropenia: She will receive Neulasta after the current cycle of chemotherapy. Arthralgias and myalgias are manageable at this time. She is agreeable to continue to takes medication.  6. Followup: Will be in 2 weeks for cycle 7 of chemotherapy.  [facility] MD 02/26/16

## 2016-02-26 NOTE — Patient Instructions (Signed)
[facility] Discharge Instructions for Patients Receiving Chemotherapy  Today you received the following chemotherapy agents:  Avastin, Leucovorin, Irinotecan, Fluorouracil  To help prevent nausea and vomiting after your treatment, we encourage you to take your nausea medication as prescribed.   If you develop nausea and vomiting that is not controlled by your nausea medication, call the clinic.   BELOW ARE SYMPTOMS THAT SHOULD BE REPORTED IMMEDIATELY:  *FEVER GREATER THAN 100.5 F  *CHILLS WITH OR WITHOUT FEVER  NAUSEA AND VOMITING THAT IS NOT CONTROLLED WITH YOUR NAUSEA MEDICATION  *UNUSUAL SHORTNESS OF BREATH  *UNUSUAL BRUISING OR BLEEDING  TENDERNESS IN MOUTH AND THROAT WITH OR WITHOUT PRESENCE OF ULCERS  *URINARY PROBLEMS  *BOWEL PROBLEMS  UNUSUAL RASH Items with * indicate a potential emergency and should be followed up as soon as possible.  Feel free to call the clinic you have any questions or concerns. The clinic phone number is [phone].  Please show the CHEMO ALERT CARD at check-in to the Emergency Department and triage nurse.   

## 2016-02-28 ENCOUNTER — Ambulatory Visit (HOSPITAL_BASED_OUTPATIENT_CLINIC_OR_DEPARTMENT_OTHER): Payer: Medicare Other

## 2016-02-28 ENCOUNTER — Ambulatory Visit: Payer: Medicare Other

## 2016-02-28 VITALS — BP 133/82 | HR 80 | Temp 98.3°F

## 2016-02-28 DIAGNOSIS — C787 Secondary malignant neoplasm of liver and intrahepatic bile duct: Secondary | ICD-10-CM

## 2016-02-28 DIAGNOSIS — K769 Liver disease, unspecified: Secondary | ICD-10-CM

## 2016-02-28 DIAGNOSIS — C180 Malignant neoplasm of cecum: Secondary | ICD-10-CM

## 2016-02-28 DIAGNOSIS — C189 Malignant neoplasm of colon, unspecified: Secondary | ICD-10-CM

## 2016-02-28 DIAGNOSIS — K6389 Other specified diseases of intestine: Secondary | ICD-10-CM

## 2016-02-28 DIAGNOSIS — Z95828 Presence of other vascular implants and grafts: Secondary | ICD-10-CM

## 2016-02-28 MED ORDER — SODIUM CHLORIDE 0.9 % IJ SOLN
10.0000 mL | INTRAMUSCULAR | Status: DC | PRN
Start: 2016-02-28 — End: 2016-02-28
  Administered 2016-02-28: 10 mL via INTRAVENOUS
  Filled 2016-02-28: qty 10

## 2016-02-28 MED ORDER — PEGFILGRASTIM INJECTION 6 MG/0.6ML
6.0000 mg | Freq: Once | SUBCUTANEOUS | Status: AC
Start: 2016-02-28 — End: 2016-02-28
  Administered 2016-02-28: 6 mg via SUBCUTANEOUS
  Filled 2016-02-28: qty 0.6

## 2016-02-28 MED ORDER — HEPARIN SOD (PORK) LOCK FLUSH 100 UNIT/ML IV SOLN
500.0000 [IU] | Freq: Once | INTRAVENOUS | Status: AC | PRN
  Administered 2016-02-28: 500 [IU] via INTRAVENOUS
  Filled 2016-02-28: qty 5

## 2016-02-28 NOTE — Progress Notes (Signed)
Pt arrived to me but was actually seen by the injection nurse who deaccessed and flushed port

## 2016-02-28 NOTE — Patient Instructions (Signed)
Pegfilgrastim injection What is this medicine? PEGFILGRASTIM (PEG fil gra stim) is a long-acting granulocyte colony-stimulating factor that stimulates the growth of neutrophils, a type of white blood cell important in the body's fight against infection. It is used to reduce the incidence of fever and infection in patients with certain types of cancer who are receiving chemotherapy that affects the bone marrow, and to increase survival after being exposed to high doses of radiation. This medicine may be used for other purposes; ask your health care provider or pharmacist if you have questions. What should I tell my health care provider before I take this medicine? They need to know if you have any of these conditions: -kidney disease -latex allergy -ongoing radiation therapy -sickle cell disease -skin reactions to acrylic adhesives (On-Body Injector only) -an unusual or allergic reaction to pegfilgrastim, filgrastim, other medicines, foods, dyes, or preservatives -pregnant or trying to get pregnant -breast-feeding How should I use this medicine? This medicine is for injection under the skin. If you get this medicine at home, you will be taught how to prepare and give the pre-filled syringe or how to use the On-body Injector. Refer to the patient Instructions for Use for detailed instructions. Use exactly as directed. Take your medicine at regular intervals. Do not take your medicine more often than directed. It is important that you put your used needles and syringes in a special sharps container. Do not put them in a trash can. If you do not have a sharps container, call your pharmacist or healthcare provider to get one. Talk to your pediatrician regarding the use of this medicine in children. While this drug may be prescribed for selected conditions, precautions do apply. Overdosage: If you think you have taken too much of this medicine contact a poison control center or emergency room at  once. NOTE: This medicine is only for you. Do not share this medicine with others. What if I miss a dose? It is important not to miss your dose. Call your doctor or health care professional if you miss your dose. If you miss a dose due to an On-body Injector failure or leakage, a new dose should be administered as soon as possible using a single prefilled syringe for manual use. What may interact with this medicine? Interactions have not been studied. Give your health care provider a list of all the medicines, herbs, non-prescription drugs, or dietary supplements you use. Also tell them if you smoke, drink alcohol, or use illegal drugs. Some items may interact with your medicine. This list may not describe all possible interactions. Give your health care provider a list of all the medicines, herbs, non-prescription drugs, or dietary supplements you use. Also tell them if you smoke, drink alcohol, or use illegal drugs. Some items may interact with your medicine. What should I watch for while using this medicine? You may need blood work done while you are taking this medicine. If you are going to need a MRI, CT scan, or other procedure, tell your doctor that you are using this medicine (On-Body Injector only). What side effects may I notice from receiving this medicine? Side effects that you should report to your doctor or health care professional as soon as possible: -allergic reactions like skin rash, itching or hives, swelling of the face, lips, or tongue -dizziness -fever -pain, redness, or irritation at site where injected -pinpoint red spots on the skin -red or dark-brown urine -shortness of breath or breathing problems -stomach or side pain, or pain   at the shoulder -swelling -tiredness -trouble passing urine or change in the amount of urine Side effects that usually do not require medical attention (report to your doctor or health care professional if they continue or are  bothersome): -bone pain -muscle pain This list may not describe all possible side effects. Call your doctor for medical advice about side effects. You may report side effects to FDA at 1-800-FDA-1088. Where should I keep my medicine? Keep out of the reach of children. Store pre-filled syringes in a refrigerator between 2 and 8 degrees C (36 and 46 degrees F). Do not freeze. Keep in carton to protect from light. Throw away this medicine if it is left out of the refrigerator for more than 48 hours. Throw away any unused medicine after the expiration date. NOTE: This sheet is a summary. It may not cover all possible information. If you have questions about this medicine, talk to your doctor, pharmacist, or health care provider.    2016, Elsevier/Gold Standard. (2014-06-28 14:30:14)  

## 2016-03-11 ENCOUNTER — Ambulatory Visit (HOSPITAL_BASED_OUTPATIENT_CLINIC_OR_DEPARTMENT_OTHER): Payer: Medicare Other

## 2016-03-11 ENCOUNTER — Other Ambulatory Visit (HOSPITAL_BASED_OUTPATIENT_CLINIC_OR_DEPARTMENT_OTHER): Payer: Medicare Other

## 2016-03-11 ENCOUNTER — Ambulatory Visit (HOSPITAL_BASED_OUTPATIENT_CLINIC_OR_DEPARTMENT_OTHER): Payer: Medicare Other | Admitting: Oncology

## 2016-03-11 ENCOUNTER — Ambulatory Visit: Payer: Medicare Other

## 2016-03-11 ENCOUNTER — Telehealth: Payer: Self-pay | Admitting: Oncology

## 2016-03-11 ENCOUNTER — Telehealth: Payer: Self-pay | Admitting: *Deleted

## 2016-03-11 VITALS — BP 134/83 | HR 91 | Temp 98.4°F | Resp 18 | Ht 64.0 in | Wt 171.4 lb

## 2016-03-11 VITALS — BP 126/79 | HR 72 | Resp 16

## 2016-03-11 DIAGNOSIS — Z79899 Other long term (current) drug therapy: Secondary | ICD-10-CM

## 2016-03-11 DIAGNOSIS — Z5111 Encounter for antineoplastic chemotherapy: Secondary | ICD-10-CM

## 2016-03-11 DIAGNOSIS — M2550 Pain in unspecified joint: Secondary | ICD-10-CM | POA: Diagnosis not present

## 2016-03-11 DIAGNOSIS — C189 Malignant neoplasm of colon, unspecified: Secondary | ICD-10-CM

## 2016-03-11 DIAGNOSIS — C180 Malignant neoplasm of cecum: Secondary | ICD-10-CM

## 2016-03-11 DIAGNOSIS — K6389 Other specified diseases of intestine: Secondary | ICD-10-CM

## 2016-03-11 DIAGNOSIS — C787 Secondary malignant neoplasm of liver and intrahepatic bile duct: Secondary | ICD-10-CM

## 2016-03-11 DIAGNOSIS — C786 Secondary malignant neoplasm of retroperitoneum and peritoneum: Secondary | ICD-10-CM

## 2016-03-11 DIAGNOSIS — Z5112 Encounter for antineoplastic immunotherapy: Secondary | ICD-10-CM

## 2016-03-11 DIAGNOSIS — K769 Liver disease, unspecified: Secondary | ICD-10-CM

## 2016-03-11 DIAGNOSIS — Z95828 Presence of other vascular implants and grafts: Secondary | ICD-10-CM

## 2016-03-11 LAB — COMPREHENSIVE METABOLIC PANEL
ALBUMIN: 3.1 g/dL — AB (ref 3.5–5.0)
ALT: 13 U/L (ref 0–55)
AST: 15 U/L (ref 5–34)
Alkaline Phosphatase: 136 U/L (ref 40–150)
Anion Gap: 8 mEq/L (ref 3–11)
BUN: 11.6 mg/dL (ref 7.0–26.0)
CALCIUM: 9 mg/dL (ref 8.4–10.4)
CHLORIDE: 112 meq/L — AB (ref 98–109)
CO2: 23 meq/L (ref 22–29)
Creatinine: 1.1 mg/dL (ref 0.6–1.1)
EGFR: 64 mL/min/{1.73_m2} — AB (ref >90)
GLUCOSE: 112 mg/dL (ref 70–140)
POTASSIUM: 3.6 meq/L (ref 3.5–5.1)
Sodium: 143 mEq/L (ref 136–145)
TOTAL PROTEIN: 6.9 g/dL (ref 6.4–8.3)
Total Bilirubin: 0.58 mg/dL (ref 0.20–1.20)

## 2016-03-11 LAB — CBC WITH DIFFERENTIAL/PLATELET
BASO%: 0.4 % (ref 0.0–2.0)
Basophils Absolute: 0 10*3/uL (ref 0.0–0.1)
EOS%: 3 % (ref 0.0–7.0)
Eosinophils Absolute: 0.2 10*3/uL (ref 0.0–0.5)
HEMATOCRIT: 31.3 % — AB (ref 34.8–46.6)
HEMOGLOBIN: 10.4 g/dL — AB (ref 11.6–15.9)
LYMPH#: 1.6 10*3/uL (ref 0.9–3.3)
LYMPH%: 20.2 % (ref 14.0–49.7)
MCH: 32 pg (ref 25.1–34.0)
MCHC: 33.2 g/dL (ref 31.5–36.0)
MCV: 96.3 fL (ref 79.5–101.0)
MONO#: 0.7 10*3/uL (ref 0.1–0.9)
MONO%: 9.3 % (ref 0.0–14.0)
NEUT%: 67.1 % (ref 38.4–76.8)
NEUTROS ABS: 5.2 10*3/uL (ref 1.5–6.5)
PLATELETS: 233 10*3/uL (ref 145–400)
RBC: 3.25 10*6/uL — ABNORMAL LOW (ref 3.70–5.45)
RDW: 18.9 % — AB (ref 11.2–14.5)
WBC: 7.8 10*3/uL (ref 3.9–10.3)

## 2016-03-11 LAB — CEA (IN HOUSE-CHCC): CEA (CHCC-In House): 45.19 ng/mL — ABNORMAL HIGH (ref 0.00–5.00)

## 2016-03-11 LAB — UA PROTEIN, DIPSTICK - CHCC: PROTEIN: NEGATIVE mg/dL

## 2016-03-11 MED ORDER — LEUCOVORIN CALCIUM INJECTION 350 MG
400.0000 mg/m2 | Freq: Once | INTRAVENOUS | Status: AC
  Administered 2016-03-11: 752 mg via INTRAVENOUS
  Filled 2016-03-11: qty 37.6

## 2016-03-11 MED ORDER — SODIUM CHLORIDE 0.9 % IJ SOLN
10.0000 mL | INTRAMUSCULAR | Status: DC | PRN
  Administered 2016-03-11: 10 mL via INTRAVENOUS
  Filled 2016-03-11: qty 10

## 2016-03-11 MED ORDER — SODIUM CHLORIDE 0.9 % IV SOLN
10.0000 mg | Freq: Once | INTRAVENOUS | Status: AC
  Administered 2016-03-11: 10 mg via INTRAVENOUS
  Filled 2016-03-11: qty 1

## 2016-03-11 MED ORDER — ATROPINE SULFATE 1 MG/ML IJ SOLN
INTRAMUSCULAR | Status: AC
  Filled 2016-03-11: qty 1

## 2016-03-11 MED ORDER — FLUOROURACIL CHEMO INJECTION 2.5 GM/50ML
400.0000 mg/m2 | Freq: Once | INTRAVENOUS | Status: AC
  Administered 2016-03-11: 750 mg via INTRAVENOUS
  Filled 2016-03-11: qty 15

## 2016-03-11 MED ORDER — IRINOTECAN HCL CHEMO INJECTION 100 MG/5ML
180.0000 mg/m2 | Freq: Once | INTRAVENOUS | Status: AC
  Administered 2016-03-11: 340 mg via INTRAVENOUS
  Filled 2016-03-11: qty 15

## 2016-03-11 MED ORDER — PALONOSETRON HCL INJECTION 0.25 MG/5ML
0.2500 mg | Freq: Once | INTRAVENOUS | Status: AC
  Administered 2016-03-11: 0.25 mg via INTRAVENOUS

## 2016-03-11 MED ORDER — ATROPINE SULFATE 1 MG/ML IJ SOLN
0.5000 mg | Freq: Once | INTRAMUSCULAR | Status: AC | PRN
  Administered 2016-03-11: 0.5 mg via INTRAVENOUS

## 2016-03-11 MED ORDER — SODIUM CHLORIDE 0.9 % IV SOLN
Freq: Once | INTRAVENOUS | Status: AC
  Administered 2016-03-11: 10:00:00 via INTRAVENOUS

## 2016-03-11 MED ORDER — SODIUM CHLORIDE 0.9 % IV SOLN
5.0000 mg/kg | Freq: Once | INTRAVENOUS | Status: AC
  Administered 2016-03-11: 400 mg via INTRAVENOUS
  Filled 2016-03-11: qty 16

## 2016-03-11 MED ORDER — SODIUM CHLORIDE 0.9 % IV SOLN
2400.0000 mg/m2 | INTRAVENOUS | Status: DC
  Administered 2016-03-11: 4500 mg via INTRAVENOUS
  Filled 2016-03-11: qty 90

## 2016-03-11 MED ORDER — PALONOSETRON HCL INJECTION 0.25 MG/5ML
INTRAVENOUS | Status: AC
  Filled 2016-03-11: qty 5

## 2016-03-11 NOTE — Telephone Encounter (Signed)
Message sent to infusion scheduler to add infusion and for Pump D/C per MD orders, per 03/11/16 los. Avs report and appointment schedule given to patient, per 03/11/16 los.

## 2016-03-11 NOTE — Patient Instructions (Signed)

## 2016-03-11 NOTE — Telephone Encounter (Signed)
Per LOS I have scheduled appts and notified the scheduler 

## 2016-03-11 NOTE — Patient Instructions (Signed)
[facility] Discharge Instructions for Patients Receiving Chemotherapy  Today you received the following chemotherapy agents Avastin, Leucovorin, Camptosar and 5FU.  To help prevent nausea and vomiting after your treatment, we encourage you to take your nausea medication.   If you develop nausea and vomiting that is not controlled by your nausea medication, call the clinic.   BELOW ARE SYMPTOMS THAT SHOULD BE REPORTED IMMEDIATELY:  *FEVER GREATER THAN 100.5 F  *CHILLS WITH OR WITHOUT FEVER  NAUSEA AND VOMITING THAT IS NOT CONTROLLED WITH YOUR NAUSEA MEDICATION  *UNUSUAL SHORTNESS OF BREATH  *UNUSUAL BRUISING OR BLEEDING  TENDERNESS IN MOUTH AND THROAT WITH OR WITHOUT PRESENCE OF ULCERS  *URINARY PROBLEMS  *BOWEL PROBLEMS  UNUSUAL RASH Items with * indicate a potential emergency and should be followed up as soon as possible.  Feel free to call the clinic you have any questions or concerns. The clinic phone number is [phone].  Please show the CHEMO ALERT CARD at check-in to the Emergency Department and triage nurse.   

## 2016-03-11 NOTE — Progress Notes (Signed)
Hematology and Oncology Follow Up Visit  Margaret Elliott [id number] [birth year]    03/11/16   Principle Diagnosis: 65 year old woman diagnosed with colon cancer in June 2014. She had presented with an abdominal pain and found to have a cecal mass resulting in obstruction and appendicitis. She had Likely has stage IV disease with a liver mass.    Prior Therapy:  She is status post laparoscopic laparotomy, evacuation of a pelvic abscess and right hemicolectomy with ileocolonic anastomosis done on December 09, 2012. The pathology showed an invasive, well- differentiated colorectal adenocarcinoma. Tumor invades through the muscularis propria, with 2/18 lymph nodes involved, with the pathological staging T3 N1b disease. Her tumor was found to be microsatellite stable. KRAS wild type.   She is also status post Port-A-Cath insertion that was done on 01/05/2013.  FOLFOX chemotherapy started 01/18/2013. Avastin to be added with cycle 2 on 02/01/2013. She is S/P 12 cycles completed in 05/2013.   She is a status post microwave ablation of metastatic lesion in the posterior segment of the right lobe of the liver completed on 09/28/2014 and repeated on 11/30/2014.  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   Current therapy: FOLFIRI and a Avastin salvage therapy started on 12/10/2015. She is here for cycle 7 of therapy.  Interim History:  Margaret Elliott presents today for a followup visit. Since her last visit, she reports no major changes in her health. She tolerated the last cycle of chemotherapy with some mild fatigue. She remains reasonably active and continues to perform work-related duties. She reports grade 1 diarrhea a few days after chemotherapy that is manageable. She did not report any nausea or vomiting. She denied any abdominal pain or early satiety.   She received Neulasta injection with few arthralgias but with Claritin and  oxycodone the symptoms are manageable. He denied any infusion-related complications.    She is not report any headaches, blurry vision, syncope or seizures. She does not report any fevers, chills or sweats. She does not report any chest pain, palpitation or orthopnea. She does not report any wheezing, hemoptysis or hematemesis. She does not report any frequency urgency or hesitancy. Does not report any lymphadenopathy or petechiae. Rest of her review of systems unremarkable.   Medications: I have reviewed the patient's current medications.  Current Outpatient Prescriptions  Medication Sig Dispense Refill  . acetaminophen (TYLENOL) 500 MG tablet Take 500 mg by mouth every 6 (six) hours as needed for pain.      . diphenhydrAMINE (BENADRYL) 25 mg capsule Take 25-50 mg by mouth daily as needed for itching. For allergic reaction      . lidocaine-prilocaine (EMLA) cream Apply 1 application topically as needed. Apply approx 1/2 tsp to skin over port, prior to chemotherapy treatments  1 kit  3  . Multiple Vitamin (MULTIVITAMIN) tablet Take 1 tablet by mouth daily.      . ondansetron (ZOFRAN) 8 MG tablet Take 1 tablet (8 mg total) by mouth every 8 (eight) hours as needed for nausea.  30 tablet  1   No current facility-administered medications for this visit.     Allergies: No Known Allergies  Past Medical History, Surgical history, Social history, and Family History were reviewed and updated.    Physical Exam:  Blood pressure 134/83, pulse 91, temperature 98.4 F (36.9 C), temperature source Oral, resp. rate 18, height '5\' 4"'$  (1.626 m), weight 171 lb 6.4 oz (77.7 kg), SpO2 100 %.  ECOG: 0 General appearance:  Well-appearing woman without distress. Head: Normocephalic, without obvious abnormality. No oral thrush or ulcers. Neck: no adenopathy. Thyroid masses. Lymph nodes: Cervical, supraclavicular, and axillary nodes normal. Heart:regular rate and rhythm, S1, S2 normal, no murmur, rubs or  gallops. Lung:chest clear, no wheezing, rales, normal symmetric air entry no dullness to percussion. Chest wall examination: Revealed Port-A-Cath site appeared without abnormalities. Abdomen: soft, non-tender, without masses or nodularity noted. No rebound or guarding. EXT:no erythema, induration, or nodules Neurological: No new neurological deficits noted.  CBC    Component Value Date/Time   WBC 7.8 03/11/2016 0759   WBC 4.0 11/20/2015 0735   RBC 3.25 (L) 03/11/2016 0759   RBC 3.95 11/20/2015 0735   HGB 10.4 (L) 03/11/2016 0759   HCT 31.3 (L) 03/11/2016 0759   PLT 233 03/11/2016 0759   MCV 96.3 03/11/2016 0759   MCH 32.0 03/11/2016 0759   MCH 30.6 11/20/2015 0735   MCHC 33.2 03/11/2016 0759   MCHC 34.1 11/20/2015 0735   RDW 18.9 (H) 03/11/2016 0759   LYMPHSABS 1.6 03/11/2016 0759   MONOABS 0.7 03/11/2016 0759   EOSABS 0.2 03/11/2016 0759   BASOSABS 0.0 03/11/2016 0759      Impression and Plan:  65 year old woman with the following issues:   1. Advanced Colon cancer. She presented with a large right cecal mass causing obstruction and abscess and appendicitis. After surgical resection she received 12 cycles of chemotherapy with PET/CT scan on 09/12/2013 showed no residual disease actually in the liver that is hypermetabolic.  She is status post liver directed therapy for metastatic disease and to the liver stream and was given in June 2016.  Her PET/CT scan on 11/08/2015 showed progressive omental and peritoneal metastasis. Biopsy proven to be adenocarcinoma of the colon that is KRAS wild-type but NRAS mutated.  She is on salvage FOLFIRI and a Avastin that is well-tolerated. CT scan on 02/05/2016 showed stable disease and the plan is to continue with the same dose and schedule of this current regimen.  The plan is to continue with the same dose and schedule and proceed with cycle 7 of therapy today. She will receive cycle 8 in 2 weeks and staging CT scan afterwards.  2.  Intravenous access. Port-A-Cath is is otherwise without complications. She denied any pain or discomfort.  3. Neuropathy: No worsening neuropathy noted at this time. She remains functional and not bothered by this at this time.  4. Antiemetics: Compazine is available to her without any breakthrough nausea or vomiting.  5. Neutropenia: She will receive Neulasta after the current cycle of chemotherapy. Arthralgias and myalgias are manageable at this time. She is agreeable to continue to takes medication.  6. Followup: Will be in 2 weeks for cycle 8 of chemotherapy.  [facility] MD 03/11/16

## 2016-03-13 ENCOUNTER — Ambulatory Visit: Payer: Medicare Other

## 2016-03-13 ENCOUNTER — Ambulatory Visit (HOSPITAL_BASED_OUTPATIENT_CLINIC_OR_DEPARTMENT_OTHER): Payer: Medicare Other

## 2016-03-13 VITALS — BP 122/71 | HR 83 | Temp 98.6°F | Resp 16

## 2016-03-13 DIAGNOSIS — K769 Liver disease, unspecified: Secondary | ICD-10-CM

## 2016-03-13 DIAGNOSIS — C189 Malignant neoplasm of colon, unspecified: Secondary | ICD-10-CM

## 2016-03-13 DIAGNOSIS — C180 Malignant neoplasm of cecum: Secondary | ICD-10-CM

## 2016-03-13 DIAGNOSIS — K6389 Other specified diseases of intestine: Secondary | ICD-10-CM

## 2016-03-13 MED ORDER — PEGFILGRASTIM INJECTION 6 MG/0.6ML ~~LOC~~
6.0000 mg | PREFILLED_SYRINGE | Freq: Once | SUBCUTANEOUS | Status: AC
  Administered 2016-03-13: 6 mg via SUBCUTANEOUS
  Filled 2016-03-13: qty 0.6

## 2016-03-13 MED ORDER — HEPARIN SOD (PORK) LOCK FLUSH 100 UNIT/ML IV SOLN
500.0000 [IU] | Freq: Once | INTRAVENOUS | Status: AC | PRN
  Administered 2016-03-13: 500 [IU]
  Filled 2016-03-13: qty 5

## 2016-03-13 MED ORDER — SODIUM CHLORIDE 0.9% FLUSH
10.0000 mL | INTRAVENOUS | Status: DC | PRN
  Administered 2016-03-13: 10 mL
  Filled 2016-03-13: qty 10

## 2016-03-13 NOTE — Progress Notes (Signed)
Neulasta to be given in Infusion Area

## 2016-03-13 NOTE — Patient Instructions (Signed)
Pegfilgrastim injection What is this medicine? PEGFILGRASTIM (PEG fil gra stim) is a long-acting granulocyte colony-stimulating factor that stimulates the growth of neutrophils, a type of white blood cell important in the body's fight against infection. It is used to reduce the incidence of fever and infection in patients with certain types of cancer who are receiving chemotherapy that affects the bone marrow, and to increase survival after being exposed to high doses of radiation. This medicine may be used for other purposes; ask your health care provider or pharmacist if you have questions. What should I tell my health care provider before I take this medicine? They need to know if you have any of these conditions: -kidney disease -latex allergy -ongoing radiation therapy -sickle cell disease -skin reactions to acrylic adhesives (On-Body Injector only) -an unusual or allergic reaction to pegfilgrastim, filgrastim, other medicines, foods, dyes, or preservatives -pregnant or trying to get pregnant -breast-feeding How should I use this medicine? This medicine is for injection under the skin. If you get this medicine at home, you will be taught how to prepare and give the pre-filled syringe or how to use the On-body Injector. Refer to the patient Instructions for Use for detailed instructions. Use exactly as directed. Take your medicine at regular intervals. Do not take your medicine more often than directed. It is important that you put your used needles and syringes in a special sharps container. Do not put them in a trash can. If you do not have a sharps container, call your pharmacist or healthcare provider to get one. Talk to your pediatrician regarding the use of this medicine in children. While this drug may be prescribed for selected conditions, precautions do apply. Overdosage: If you think you have taken too much of this medicine contact a poison control center or emergency room at  once. NOTE: This medicine is only for you. Do not share this medicine with others. What if I miss a dose? It is important not to miss your dose. Call your doctor or health care professional if you miss your dose. If you miss a dose due to an On-body Injector failure or leakage, a new dose should be administered as soon as possible using a single prefilled syringe for manual use. What may interact with this medicine? Interactions have not been studied. Give your health care provider a list of all the medicines, herbs, non-prescription drugs, or dietary supplements you use. Also tell them if you smoke, drink alcohol, or use illegal drugs. Some items may interact with your medicine. This list may not describe all possible interactions. Give your health care provider a list of all the medicines, herbs, non-prescription drugs, or dietary supplements you use. Also tell them if you smoke, drink alcohol, or use illegal drugs. Some items may interact with your medicine. What should I watch for while using this medicine? You may need blood work done while you are taking this medicine. If you are going to need a MRI, CT scan, or other procedure, tell your doctor that you are using this medicine (On-Body Injector only). What side effects may I notice from receiving this medicine? Side effects that you should report to your doctor or health care professional as soon as possible: -allergic reactions like skin rash, itching or hives, swelling of the face, lips, or tongue -dizziness -fever -pain, redness, or irritation at site where injected -pinpoint red spots on the skin -red or dark-brown urine -shortness of breath or breathing problems -stomach or side pain, or pain   at the shoulder -swelling -tiredness -trouble passing urine or change in the amount of urine Side effects that usually do not require medical attention (report to your doctor or health care professional if they continue or are  bothersome): -bone pain -muscle pain This list may not describe all possible side effects. Call your doctor for medical advice about side effects. You may report side effects to FDA at 1-800-FDA-1088. Where should I keep my medicine? Keep out of the reach of children. Store pre-filled syringes in a refrigerator between 2 and 8 degrees C (36 and 46 degrees F). Do not freeze. Keep in carton to protect from light. Throw away this medicine if it is left out of the refrigerator for more than 48 hours. Throw away any unused medicine after the expiration date. NOTE: This sheet is a summary. It may not cover all possible information. If you have questions about this medicine, talk to your doctor, pharmacist, or health care provider.    2016, Elsevier/Gold Standard. (2014-06-28 14:30:14) Implanted Port Home Guide An implanted port is a type of central line that is placed under the skin. Central lines are used to provide IV access when treatment or nutrition needs to be given through a person's veins. Implanted ports are used for long-term IV access. An implanted port may be placed because:   You need IV medicine that would be irritating to the small veins in your hands or arms.   You need long-term IV medicines, such as antibiotics.   You need IV nutrition for a long period.   You need frequent blood draws for lab tests.   You need dialysis.  Implanted ports are usually placed in the chest area, but they can also be placed in the upper arm, the abdomen, or the leg. An implanted port has two main parts:   Reservoir. The reservoir is round and will appear as a small, raised area under your skin. The reservoir is the part where a needle is inserted to give medicines or draw blood.   Catheter. The catheter is a thin, flexible tube that extends from the reservoir. The catheter is placed into a large vein. Medicine that is inserted into the reservoir goes into the catheter and then into the vein.   HOW WILL I CARE FOR MY INCISION SITE? Do not get the incision site wet. Bathe or shower as directed by your health care provider.  HOW IS MY PORT ACCESSED? Special steps must be taken to access the port:   Before the port is accessed, a numbing cream can be placed on the skin. This helps numb the skin over the port site.   Your health care provider uses a sterile technique to access the port.  Your health care provider must put on a mask and sterile gloves.  The skin over your port is cleaned carefully with an antiseptic and allowed to dry.  The port is gently pinched between sterile gloves, and a needle is inserted into the port.  Only "non-coring" port needles should be used to access the port. Once the port is accessed, a blood return should be checked. This helps ensure that the port is in the vein and is not clogged.   If your port needs to remain accessed for a constant infusion, a clear (transparent) bandage will be placed over the needle site. The bandage and needle will need to be changed every week, or as directed by your health care provider.   Keep the bandage covering the   needle clean and dry. Do not get it wet. Follow your health care provider's instructions on how to take a shower or bath while the port is accessed.   If your port does not need to stay accessed, no bandage is needed over the port.  WHAT IS FLUSHING? Flushing helps keep the port from getting clogged. Follow your health care provider's instructions on how and when to flush the port. Ports are usually flushed with saline solution or a medicine called heparin. The need for flushing will depend on how the port is used.   If the port is used for intermittent medicines or blood draws, the port will need to be flushed:   After medicines have been given.   After blood has been drawn.   As part of routine maintenance.   If a constant infusion is running, the port may not need to be flushed.  HOW  LONG WILL MY PORT STAY IMPLANTED? The port can stay in for as long as your health care provider thinks it is needed. When it is time for the port to come out, surgery will be done to remove it. The procedure is similar to the one performed when the port was put in.  WHEN SHOULD I SEEK IMMEDIATE MEDICAL CARE? When you have an implanted port, you should seek immediate medical care if:   You notice a bad smell coming from the incision site.   You have swelling, redness, or drainage at the incision site.   You have more swelling or pain at the port site or the surrounding area.   You have a fever that is not controlled with medicine.   This information is not intended to replace advice given to you by your health care provider. Make sure you discuss any questions you have with your health care provider.   Document Released: 06/08/2005 Document Revised: 03/29/2013 Document Reviewed: 02/13/2013 Elsevier Interactive Patient Education 2016 Elsevier Inc.  

## 2016-03-25 ENCOUNTER — Ambulatory Visit: Payer: Medicare Other

## 2016-03-25 ENCOUNTER — Ambulatory Visit (HOSPITAL_BASED_OUTPATIENT_CLINIC_OR_DEPARTMENT_OTHER): Payer: Medicare Other

## 2016-03-25 ENCOUNTER — Ambulatory Visit (HOSPITAL_BASED_OUTPATIENT_CLINIC_OR_DEPARTMENT_OTHER): Payer: Medicare Other | Admitting: Oncology

## 2016-03-25 ENCOUNTER — Telehealth: Payer: Self-pay | Admitting: Oncology

## 2016-03-25 ENCOUNTER — Other Ambulatory Visit (HOSPITAL_BASED_OUTPATIENT_CLINIC_OR_DEPARTMENT_OTHER): Payer: Medicare Other

## 2016-03-25 VITALS — BP 163/80 | HR 81 | Temp 98.1°F | Resp 18 | Wt 171.0 lb

## 2016-03-25 DIAGNOSIS — Z5111 Encounter for antineoplastic chemotherapy: Secondary | ICD-10-CM | POA: Diagnosis not present

## 2016-03-25 DIAGNOSIS — C787 Secondary malignant neoplasm of liver and intrahepatic bile duct: Principal | ICD-10-CM

## 2016-03-25 DIAGNOSIS — C189 Malignant neoplasm of colon, unspecified: Secondary | ICD-10-CM

## 2016-03-25 DIAGNOSIS — G609 Hereditary and idiopathic neuropathy, unspecified: Secondary | ICD-10-CM | POA: Diagnosis not present

## 2016-03-25 DIAGNOSIS — C180 Malignant neoplasm of cecum: Secondary | ICD-10-CM | POA: Diagnosis not present

## 2016-03-25 DIAGNOSIS — C786 Secondary malignant neoplasm of retroperitoneum and peritoneum: Secondary | ICD-10-CM

## 2016-03-25 DIAGNOSIS — Z95828 Presence of other vascular implants and grafts: Secondary | ICD-10-CM

## 2016-03-25 DIAGNOSIS — Z5112 Encounter for antineoplastic immunotherapy: Secondary | ICD-10-CM | POA: Diagnosis present

## 2016-03-25 DIAGNOSIS — K769 Liver disease, unspecified: Secondary | ICD-10-CM

## 2016-03-25 DIAGNOSIS — K6389 Other specified diseases of intestine: Secondary | ICD-10-CM

## 2016-03-25 LAB — COMPREHENSIVE METABOLIC PANEL
ALBUMIN: 3 g/dL — AB (ref 3.5–5.0)
ALK PHOS: 129 U/L (ref 40–150)
ALT: 12 U/L (ref 0–55)
ANION GAP: 8 meq/L (ref 3–11)
AST: 18 U/L (ref 5–34)
BILIRUBIN TOTAL: 0.41 mg/dL (ref 0.20–1.20)
BUN: 10.8 mg/dL (ref 7.0–26.0)
CALCIUM: 8.9 mg/dL (ref 8.4–10.4)
CO2: 24 mEq/L (ref 22–29)
CREATININE: 1 mg/dL (ref 0.6–1.1)
Chloride: 110 mEq/L — ABNORMAL HIGH (ref 98–109)
EGFR: 70 mL/min/{1.73_m2} — AB (ref >90)
Glucose: 106 mg/dl (ref 70–140)
Potassium: 3.8 mEq/L (ref 3.5–5.1)
Sodium: 142 mEq/L (ref 136–145)
TOTAL PROTEIN: 6.6 g/dL (ref 6.4–8.3)

## 2016-03-25 LAB — CBC WITH DIFFERENTIAL/PLATELET
BASO%: 0.4 % (ref 0.0–2.0)
Basophils Absolute: 0 10*3/uL (ref 0.0–0.1)
EOS ABS: 0.1 10*3/uL (ref 0.0–0.5)
EOS%: 1 % (ref 0.0–7.0)
HEMATOCRIT: 31.8 % — AB (ref 34.8–46.6)
HGB: 10.3 g/dL — ABNORMAL LOW (ref 11.6–15.9)
LYMPH#: 1.6 10*3/uL (ref 0.9–3.3)
LYMPH%: 18.2 % (ref 14.0–49.7)
MCH: 32.1 pg (ref 25.1–34.0)
MCHC: 32.4 g/dL (ref 31.5–36.0)
MCV: 99.1 fL (ref 79.5–101.0)
MONO#: 0.8 10*3/uL (ref 0.1–0.9)
MONO%: 9.3 % (ref 0.0–14.0)
NEUT%: 71.1 % (ref 38.4–76.8)
NEUTROS ABS: 6.3 10*3/uL (ref 1.5–6.5)
PLATELETS: 205 10*3/uL (ref 145–400)
RBC: 3.21 10*6/uL — AB (ref 3.70–5.45)
RDW: 19.3 % — ABNORMAL HIGH (ref 11.2–14.5)
WBC: 8.9 10*3/uL (ref 3.9–10.3)

## 2016-03-25 LAB — CEA (IN HOUSE-CHCC): CEA (CHCC-IN HOUSE): 32.02 ng/mL — AB (ref 0.00–5.00)

## 2016-03-25 MED ORDER — SODIUM CHLORIDE 0.9% FLUSH
10.0000 mL | INTRAVENOUS | Status: DC | PRN
  Filled 2016-03-25: qty 10

## 2016-03-25 MED ORDER — SODIUM CHLORIDE 0.9 % IV SOLN
2400.0000 mg/m2 | INTRAVENOUS | Status: DC
  Administered 2016-03-25: 4500 mg via INTRAVENOUS
  Filled 2016-03-25: qty 90

## 2016-03-25 MED ORDER — SODIUM CHLORIDE 0.9 % IV SOLN
10.0000 mg | Freq: Once | INTRAVENOUS | Status: AC
  Administered 2016-03-25: 10 mg via INTRAVENOUS
  Filled 2016-03-25: qty 1

## 2016-03-25 MED ORDER — ATROPINE SULFATE 1 MG/ML IJ SOLN
0.5000 mg | Freq: Once | INTRAMUSCULAR | Status: AC | PRN
  Administered 2016-03-25: 0.5 mg via INTRAVENOUS

## 2016-03-25 MED ORDER — SODIUM CHLORIDE 0.9 % IV SOLN
5.0000 mg/kg | Freq: Once | INTRAVENOUS | Status: AC
  Administered 2016-03-25: 400 mg via INTRAVENOUS
  Filled 2016-03-25: qty 16

## 2016-03-25 MED ORDER — SODIUM CHLORIDE 0.9 % IJ SOLN
10.0000 mL | INTRAMUSCULAR | Status: DC | PRN
  Administered 2016-03-25: 10 mL via INTRAVENOUS
  Filled 2016-03-25: qty 10

## 2016-03-25 MED ORDER — FLUOROURACIL CHEMO INJECTION 2.5 GM/50ML
400.0000 mg/m2 | Freq: Once | INTRAVENOUS | Status: AC
  Administered 2016-03-25: 750 mg via INTRAVENOUS
  Filled 2016-03-25: qty 15

## 2016-03-25 MED ORDER — PALONOSETRON HCL INJECTION 0.25 MG/5ML
0.2500 mg | Freq: Once | INTRAVENOUS | Status: AC
  Administered 2016-03-25: 0.25 mg via INTRAVENOUS

## 2016-03-25 MED ORDER — SODIUM CHLORIDE 0.9 % IV SOLN
Freq: Once | INTRAVENOUS | Status: AC
  Administered 2016-03-25: 10:00:00 via INTRAVENOUS

## 2016-03-25 MED ORDER — PALONOSETRON HCL INJECTION 0.25 MG/5ML
INTRAVENOUS | Status: AC
  Filled 2016-03-25: qty 5

## 2016-03-25 MED ORDER — ATROPINE SULFATE 1 MG/ML IJ SOLN
INTRAMUSCULAR | Status: AC
  Filled 2016-03-25: qty 1

## 2016-03-25 MED ORDER — IRINOTECAN HCL CHEMO INJECTION 100 MG/5ML
135.0000 mg/m2 | Freq: Once | INTRAVENOUS | Status: AC
  Administered 2016-03-25: 260 mg via INTRAVENOUS
  Filled 2016-03-25: qty 5

## 2016-03-25 MED ORDER — HEPARIN SOD (PORK) LOCK FLUSH 100 UNIT/ML IV SOLN
500.0000 [IU] | Freq: Once | INTRAVENOUS | Status: DC | PRN
  Filled 2016-03-25: qty 5

## 2016-03-25 MED ORDER — LEUCOVORIN CALCIUM INJECTION 350 MG
400.0000 mg/m2 | Freq: Once | INTRAVENOUS | Status: AC
  Administered 2016-03-25: 752 mg via INTRAVENOUS
  Filled 2016-03-25: qty 37.6

## 2016-03-25 NOTE — Progress Notes (Signed)
Hematology and Oncology Follow Up Visit  AMILLIA BIFFLE [id number] 12-02-1950    03/25/16   Principle Diagnosis: 65 year old woman diagnosed with colon cancer in June 2014. She had presented with an abdominal pain and found to have a cecal mass resulting in obstruction and appendicitis. She had Likely has stage IV disease with a liver mass.    Prior Therapy:  She is status post laparoscopic laparotomy, evacuation of a pelvic abscess and right hemicolectomy with ileocolonic anastomosis done on December 09, 2012. The pathology showed an invasive, well- differentiated colorectal adenocarcinoma. Tumor invades through the muscularis propria, with 2/18 lymph nodes involved, with the pathological staging T3 N1b disease. Her tumor was found to be microsatellite stable. KRAS wild type.   She is also status post Port-A-Cath insertion that was done on 01/05/2013.  FOLFOX chemotherapy started 01/18/2013. Avastin to be added with cycle 2 on 02/01/2013. She is S/P 12 cycles completed in 05/2013.   She is a status post microwave ablation of metastatic lesion in the posterior segment of the right lobe of the liver completed on 09/28/2014 and repeated on 11/30/2014.  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   Current therapy: FOLFIRI and a Avastin salvage therapy started on 12/10/2015. She is here for cycle 8 of therapy.  Interim History:  Ms. Kiger presents today for a followup visit. Since her last visit, she reports few complications associated with chemotherapy. She is reporting more sensory neuropathy in her hands and feet. She is reporting more fatigue as well as occasional diarrhea. She remains reasonably active and continues to perform work-related duties. She did not report any nausea or vomiting. She denied any abdominal pain or early satiety. She continues to receive Neulasta without any complications including arthralgias or  myalgias. She does take hydrocodone which helped her symptoms.    She is not report any headaches, blurry vision, syncope or seizures. She does not report any fevers, chills or sweats. She does not report any chest pain, palpitation or orthopnea. She does not report any wheezing, hemoptysis or hematemesis. She does not report any frequency urgency or hesitancy. Does not report any lymphadenopathy or petechiae. Rest of her review of systems unremarkable.   Medications: I have reviewed the patient's current medications.  Current Outpatient Prescriptions  Medication Sig Dispense Refill  . acetaminophen (TYLENOL) 500 MG tablet Take 500 mg by mouth every 6 (six) hours as needed for pain.      . diphenhydrAMINE (BENADRYL) 25 mg capsule Take 25-50 mg by mouth daily as needed for itching. For allergic reaction      . lidocaine-prilocaine (EMLA) cream Apply 1 application topically as needed. Apply approx 1/2 tsp to skin over port, prior to chemotherapy treatments  1 kit  3  . Multiple Vitamin (MULTIVITAMIN) tablet Take 1 tablet by mouth daily.      . ondansetron (ZOFRAN) 8 MG tablet Take 1 tablet (8 mg total) by mouth every 8 (eight) hours as needed for nausea.  30 tablet  1   No current facility-administered medications for this visit.     Allergies: No Known Allergies  Past Medical History, Surgical history, Social history, and Family History were reviewed and updated.    Physical Exam:  Blood pressure (!) 163/80, pulse 81, temperature 98.1 F (36.7 C), temperature source Oral, resp. rate 18, weight 171 lb (77.6 kg), SpO2 100 %. ECOG: 0 General appearance: Alert, awake woman without distress. Head: Normocephalic, without  obvious abnormality. No oral ulcers or lesions. Neck: no adenopathy. Thyroid masses. Lymph nodes: Cervical, supraclavicular, and axillary nodes normal. Heart:regular rate and rhythm, S1, S2 normal, no murmur, rubs or gallops. Lung:chest clear, no wheezing, rales, normal  symmetric air entry no dullness to percussion. Chest wall examination: Revealed Port-A-Cath site appeared without abnormalities. Abdomen: soft, non-tender, without masses or nodularity noted. No shifting dullness or ascites. EXT:no erythema, induration, or nodules Neurological: No new neurological deficits noted.  CBC    Component Value Date/Time   WBC 8.9 03/25/2016 0820   WBC 4.0 11/20/2015 0735   RBC 3.21 (L) 03/25/2016 0820   RBC 3.95 11/20/2015 0735   HGB 10.3 (L) 03/25/2016 0820   HCT 31.8 (L) 03/25/2016 0820   PLT 205 03/25/2016 0820   MCV 99.1 03/25/2016 0820   MCH 32.1 03/25/2016 0820   MCH 30.6 11/20/2015 0735   MCHC 32.4 03/25/2016 0820   MCHC 34.1 11/20/2015 0735   RDW 19.3 (H) 03/25/2016 0820   LYMPHSABS 1.6 03/25/2016 0820   MONOABS 0.8 03/25/2016 0820   EOSABS 0.1 03/25/2016 0820   BASOSABS 0.0 03/25/2016 0820      Impression and Plan:  65 year old woman with the following issues:   1. Advanced Colon cancer. She presented with a large right cecal mass causing obstruction and abscess and appendicitis. After surgical resection she received 12 cycles of chemotherapy with PET/CT scan on 09/12/2013 showed no residual disease actually in the liver that is hypermetabolic.  She is status post liver directed therapy for metastatic disease and to the liver stream and was given in June 2016.  Her PET/CT scan on 11/08/2015 showed progressive omental and peritoneal metastasis. Biopsy proven to be adenocarcinoma of the colon that is KRAS wild-type but NRAS mutated.  She is on salvage FOLFIRI and a Avastin that is well-tolerated. CT scan on 02/05/2016 showed stable disease and the plan is to continue with the same dose and schedule of this current regimen.  The plan is to continue with the same dose and schedule and proceed with cycle 8 of therapy with dose reduction of CPT-11 for better tolerance. She is developing neutropenia and GI symptoms. She will receive a CT scan  before the next cycle of chemotherapy and determine the best course of action. The options after her CT scan is to suspend chemotherapy, continued full dose or resume maintenance chemotherapy with 5-FU and a Avastin. This will be discussed further after the next visit.  2. Intravenous access. Port-A-Cath is is otherwise without complications. She denied any pain or discomfort.  3. Neuropathy: Appears to have worsened at this time on chemotherapy. I will reduce her CPT-11 by 25%.  4. Antiemetics: Compazine is available to her without any breakthrough nausea or vomiting.  5. Neutropenia: She will receive Neulasta after the current cycle of chemotherapy. Arthralgias and myalgias are manageable at this time. She is agreeable to continue to takes medication.  6. Followup: Will be in 2 weeks for cycle 9 of chemotherapy.  [facility] MD 03/25/16

## 2016-03-25 NOTE — Patient Instructions (Addendum)
[facility] Discharge Instructions for Patients Receiving Chemotherapy  Today you received the following chemotherapy agents:  Avastin, Leucovorin, Irinotecan, Fluorouracil  To help prevent nausea and vomiting after your treatment, we encourage you to take your nausea medication as prescribed.   If you develop nausea and vomiting that is not controlled by your nausea medication, call the clinic.   BELOW ARE SYMPTOMS THAT SHOULD BE REPORTED IMMEDIATELY:  *FEVER GREATER THAN 100.5 F  *CHILLS WITH OR WITHOUT FEVER  NAUSEA AND VOMITING THAT IS NOT CONTROLLED WITH YOUR NAUSEA MEDICATION  *UNUSUAL SHORTNESS OF BREATH  *UNUSUAL BRUISING OR BLEEDING  TENDERNESS IN MOUTH AND THROAT WITH OR WITHOUT PRESENCE OF ULCERS  *URINARY PROBLEMS  *BOWEL PROBLEMS  UNUSUAL RASH Items with * indicate a potential emergency and should be followed up as soon as possible.  Feel free to call the clinic you have any questions or concerns. The clinic phone number is [phone].  Please show the CHEMO ALERT CARD at check-in to the Emergency Department and triage nurse.   

## 2016-03-25 NOTE — Telephone Encounter (Signed)
No 10/4 los. Gave patient contrast for ct earlier today. Central will call re scan.

## 2016-03-27 ENCOUNTER — Ambulatory Visit: Payer: Medicare Other

## 2016-03-27 ENCOUNTER — Ambulatory Visit (HOSPITAL_BASED_OUTPATIENT_CLINIC_OR_DEPARTMENT_OTHER): Payer: Medicare Other

## 2016-03-27 VITALS — BP 112/68 | HR 85 | Temp 98.4°F | Resp 17

## 2016-03-27 DIAGNOSIS — C189 Malignant neoplasm of colon, unspecified: Secondary | ICD-10-CM

## 2016-03-27 DIAGNOSIS — K6389 Other specified diseases of intestine: Secondary | ICD-10-CM

## 2016-03-27 DIAGNOSIS — K769 Liver disease, unspecified: Secondary | ICD-10-CM

## 2016-03-27 DIAGNOSIS — C180 Malignant neoplasm of cecum: Secondary | ICD-10-CM

## 2016-03-27 MED ORDER — SODIUM CHLORIDE 0.9% FLUSH
10.0000 mL | INTRAVENOUS | Status: DC | PRN
  Administered 2016-03-27: 10 mL
  Filled 2016-03-27: qty 10

## 2016-03-27 MED ORDER — PEGFILGRASTIM INJECTION 6 MG/0.6ML ~~LOC~~
6.0000 mg | PREFILLED_SYRINGE | Freq: Once | SUBCUTANEOUS | Status: AC
  Administered 2016-03-27: 6 mg via SUBCUTANEOUS
  Filled 2016-03-27: qty 0.6

## 2016-03-27 MED ORDER — HEPARIN SOD (PORK) LOCK FLUSH 100 UNIT/ML IV SOLN
500.0000 [IU] | Freq: Once | INTRAVENOUS | Status: AC | PRN
  Administered 2016-03-27: 500 [IU]
  Filled 2016-03-27: qty 5

## 2016-03-27 NOTE — Patient Instructions (Signed)
Implanted Port Home Guide An implanted port is a type of central line that is placed under the skin. Central lines are used to provide IV access when treatment or nutrition needs to be given through a person's veins. Implanted ports are used for long-term IV access. An implanted port may be placed because:   You need IV medicine that would be irritating to the small veins in your hands or arms.   You need long-term IV medicines, such as antibiotics.   You need IV nutrition for a long period.   You need frequent blood draws for lab tests.   You need dialysis.  Implanted ports are usually placed in the chest area, but they can also be placed in the upper arm, the abdomen, or the leg. An implanted port has two main parts:   Reservoir. The reservoir is round and will appear as a small, raised area under your skin. The reservoir is the part where a needle is inserted to give medicines or draw blood.   Catheter. The catheter is a thin, flexible tube that extends from the reservoir. The catheter is placed into a large vein. Medicine that is inserted into the reservoir goes into the catheter and then into the vein.  HOW WILL I CARE FOR MY INCISION SITE? Do not get the incision site wet. Bathe or shower as directed by your health care provider.  HOW IS MY PORT ACCESSED? Special steps must be taken to access the port:   Before the port is accessed, a numbing cream can be placed on the skin. This helps numb the skin over the port site.   Your health care provider uses a sterile technique to access the port.  Your health care provider must put on a mask and sterile gloves.  The skin over your port is cleaned carefully with an antiseptic and allowed to dry.  The port is gently pinched between sterile gloves, and a needle is inserted into the port.  Only "non-coring" port needles should be used to access the port. Once the port is accessed, a blood return should be checked. This helps  ensure that the port is in the vein and is not clogged.   If your port needs to remain accessed for a constant infusion, a clear (transparent) bandage will be placed over the needle site. The bandage and needle will need to be changed every week, or as directed by your health care provider.   Keep the bandage covering the needle clean and dry. Do not get it wet. Follow your health care provider's instructions on how to take a shower or bath while the port is accessed.   If your port does not need to stay accessed, no bandage is needed over the port.  WHAT IS FLUSHING? Flushing helps keep the port from getting clogged. Follow your health care provider's instructions on how and when to flush the port. Ports are usually flushed with saline solution or a medicine called heparin. The need for flushing will depend on how the port is used.   If the port is used for intermittent medicines or blood draws, the port will need to be flushed:   After medicines have been given.   After blood has been drawn.   As part of routine maintenance.   If a constant infusion is running, the port may not need to be flushed.  HOW LONG WILL MY PORT STAY IMPLANTED? The port can stay in for as long as your health care   provider thinks it is needed. When it is time for the port to come out, surgery will be done to remove it. The procedure is similar to the one performed when the port was put in.  WHEN SHOULD I SEEK IMMEDIATE MEDICAL CARE? When you have an implanted port, you should seek immediate medical care if:   You notice a bad smell coming from the incision site.   You have swelling, redness, or drainage at the incision site.   You have more swelling or pain at the port site or the surrounding area.   You have a fever that is not controlled with medicine.   This information is not intended to replace advice given to you by your health care provider. Make sure you discuss any questions you have with  your health care provider.   Document Released: 06/08/2005 Document Revised: 03/29/2013 Document Reviewed: 02/13/2013 Elsevier Interactive Patient Education 2016 [employer] Pegfilgrastim injection What is this medicine? PEGFILGRASTIM (PEG fil gra stim) is a long-acting granulocyte colony-stimulating factor that stimulates the growth of neutrophils, a type of white blood cell important in the body's fight against infection. It is used to reduce the incidence of fever and infection in patients with certain types of cancer who are receiving chemotherapy that affects the bone marrow, and to increase survival after being exposed to high doses of radiation. This medicine may be used for other purposes; ask your health care provider or pharmacist if you have questions. What should I tell my health care provider before I take this medicine? They need to know if you have any of these conditions: -kidney disease -latex allergy -ongoing radiation therapy -sickle cell disease -skin reactions to acrylic adhesives (On-Body Injector only) -an unusual or allergic reaction to pegfilgrastim, filgrastim, other medicines, foods, dyes, or preservatives -pregnant or trying to get pregnant -breast-feeding How should I use this medicine? This medicine is for injection under the skin. If you get this medicine at home, you will be taught how to prepare and give the pre-filled syringe or how to use the On-body Injector. Refer to the patient Instructions for Use for detailed instructions. Use exactly as directed. Take your medicine at regular intervals. Do not take your medicine more often than directed. It is important that you put your used needles and syringes in a special sharps container. Do not put them in a trash can. If you do not have a sharps container, call your pharmacist or healthcare provider to get one. Talk to your pediatrician regarding the use of this medicine in children. While this drug may be  prescribed for selected conditions, precautions do apply. Overdosage: If you think you have taken too much of this medicine contact a poison control center or emergency room at once. NOTE: This medicine is only for you. Do not share this medicine with others. What if I miss a dose? It is important not to miss your dose. Call your doctor or health care professional if you miss your dose. If you miss a dose due to an On-body Injector failure or leakage, a new dose should be administered as soon as possible using a single prefilled syringe for manual use. What may interact with this medicine? Interactions have not been studied. Give your health care provider a list of all the medicines, herbs, non-prescription drugs, or dietary supplements you use. Also tell them if you smoke, drink alcohol, or use illegal drugs. Some items may interact with your medicine. This list may not describe all possible   interactions. Give your health care provider a list of all the medicines, herbs, non-prescription drugs, or dietary supplements you use. Also tell them if you smoke, drink alcohol, or use illegal drugs. Some items may interact with your medicine. What should I watch for while using this medicine? You may need blood work done while you are taking this medicine. If you are going to need a MRI, CT scan, or other procedure, tell your doctor that you are using this medicine (On-Body Injector only). What side effects may I notice from receiving this medicine? Side effects that you should report to your doctor or health care professional as soon as possible: -allergic reactions like skin rash, itching or hives, swelling of the face, lips, or tongue -dizziness -fever -pain, redness, or irritation at site where injected -pinpoint red spots on the skin -red or dark-brown urine -shortness of breath or breathing problems -stomach or side pain, or pain at the shoulder -swelling -tiredness -trouble passing urine or  change in the amount of urine Side effects that usually do not require medical attention (report to your doctor or health care professional if they continue or are bothersome): -bone pain -muscle pain This list may not describe all possible side effects. Call your doctor for medical advice about side effects. You may report side effects to FDA at 1-800-FDA-1088. Where should I keep my medicine? Keep out of the reach of children. Store pre-filled syringes in a refrigerator between 2 and 8 degrees C (36 and 46 degrees F). Do not freeze. Keep in carton to protect from light. Throw away this medicine if it is left out of the refrigerator for more than 48 hours. Throw away any unused medicine after the expiration date. NOTE: This sheet is a summary. It may not cover all possible information. If you have questions about this medicine, talk to your doctor, pharmacist, or health care provider.    2016, Elsevier/Gold Standard. (2014-06-28 14:30:14)  

## 2016-03-27 NOTE — Progress Notes (Signed)
Pump D/C and neulasta injection given in Infusoin Room.

## 2016-04-07 ENCOUNTER — Ambulatory Visit (HOSPITAL_COMMUNITY)
Admission: RE | Admit: 2016-04-07 | Discharge: 2016-04-07 | Disposition: A | Discharge status: Home / Self Care | Payer: Medicare Other | Source: Ambulatory Visit | Attending: Oncology | Admitting: Oncology

## 2016-04-07 DIAGNOSIS — N839 Noninflammatory disorder of ovary, fallopian tube and broad ligament, unspecified: Secondary | ICD-10-CM | POA: Diagnosis not present

## 2016-04-07 DIAGNOSIS — C189 Malignant neoplasm of colon, unspecified: Secondary | ICD-10-CM | POA: Insufficient documentation

## 2016-04-07 DIAGNOSIS — R935 Abnormal findings on diagnostic imaging of other abdominal regions, including retroperitoneum: Secondary | ICD-10-CM | POA: Insufficient documentation

## 2016-04-07 MED ORDER — IOPAMIDOL (ISOVUE-300) INJECTION 61%
100.0000 mL | Freq: Once | INTRAVENOUS | Status: AC | PRN
  Administered 2016-04-07: 100 mL via INTRAVENOUS

## 2016-04-08 ENCOUNTER — Ambulatory Visit (HOSPITAL_BASED_OUTPATIENT_CLINIC_OR_DEPARTMENT_OTHER): Payer: Medicare Other | Admitting: Oncology

## 2016-04-08 ENCOUNTER — Telehealth: Payer: Self-pay | Admitting: Oncology

## 2016-04-08 ENCOUNTER — Ambulatory Visit: Payer: Medicare Other

## 2016-04-08 ENCOUNTER — Ambulatory Visit (HOSPITAL_BASED_OUTPATIENT_CLINIC_OR_DEPARTMENT_OTHER): Payer: Medicare Other

## 2016-04-08 ENCOUNTER — Other Ambulatory Visit (HOSPITAL_BASED_OUTPATIENT_CLINIC_OR_DEPARTMENT_OTHER): Payer: Medicare Other

## 2016-04-08 VITALS — BP 139/84 | HR 86 | Temp 98.3°F | Resp 18 | Ht 64.0 in | Wt 169.2 lb

## 2016-04-08 DIAGNOSIS — Z5112 Encounter for antineoplastic immunotherapy: Secondary | ICD-10-CM

## 2016-04-08 DIAGNOSIS — G620 Drug-induced polyneuropathy: Secondary | ICD-10-CM

## 2016-04-08 DIAGNOSIS — C180 Malignant neoplasm of cecum: Secondary | ICD-10-CM | POA: Diagnosis not present

## 2016-04-08 DIAGNOSIS — C787 Secondary malignant neoplasm of liver and intrahepatic bile duct: Secondary | ICD-10-CM

## 2016-04-08 DIAGNOSIS — Z5111 Encounter for antineoplastic chemotherapy: Secondary | ICD-10-CM

## 2016-04-08 DIAGNOSIS — K6389 Other specified diseases of intestine: Secondary | ICD-10-CM

## 2016-04-08 DIAGNOSIS — C786 Secondary malignant neoplasm of retroperitoneum and peritoneum: Secondary | ICD-10-CM

## 2016-04-08 DIAGNOSIS — Z95828 Presence of other vascular implants and grafts: Secondary | ICD-10-CM

## 2016-04-08 DIAGNOSIS — C189 Malignant neoplasm of colon, unspecified: Secondary | ICD-10-CM

## 2016-04-08 DIAGNOSIS — K769 Liver disease, unspecified: Secondary | ICD-10-CM

## 2016-04-08 LAB — COMPREHENSIVE METABOLIC PANEL
ALT: 11 U/L (ref 0–55)
ANION GAP: 9 meq/L (ref 3–11)
AST: 17 U/L (ref 5–34)
Albumin: 3.3 g/dL — ABNORMAL LOW (ref 3.5–5.0)
Alkaline Phosphatase: 157 U/L — ABNORMAL HIGH (ref 40–150)
BUN: 16.6 mg/dL (ref 7.0–26.0)
CHLORIDE: 110 meq/L — AB (ref 98–109)
CO2: 22 meq/L (ref 22–29)
Calcium: 8.9 mg/dL (ref 8.4–10.4)
Creatinine: 1.1 mg/dL (ref 0.6–1.1)
EGFR: 63 mL/min/{1.73_m2} — AB (ref >90)
Glucose: 118 mg/dl (ref 70–140)
POTASSIUM: 4.2 meq/L (ref 3.5–5.1)
Sodium: 140 mEq/L (ref 136–145)
Total Bilirubin: 0.51 mg/dL (ref 0.20–1.20)
Total Protein: 7.1 g/dL (ref 6.4–8.3)

## 2016-04-08 LAB — CBC WITH DIFFERENTIAL/PLATELET
BASO%: 0.3 % (ref 0.0–2.0)
BASOS ABS: 0 10*3/uL (ref 0.0–0.1)
EOS%: 1.2 % (ref 0.0–7.0)
Eosinophils Absolute: 0.1 10*3/uL (ref 0.0–0.5)
HCT: 33.5 % — ABNORMAL LOW (ref 34.8–46.6)
HGB: 10.9 g/dL — ABNORMAL LOW (ref 11.6–15.9)
LYMPH%: 15.7 % (ref 14.0–49.7)
MCH: 32.3 pg (ref 25.1–34.0)
MCHC: 32.5 g/dL (ref 31.5–36.0)
MCV: 99.4 fL (ref 79.5–101.0)
MONO#: 1 10*3/uL — AB (ref 0.1–0.9)
MONO%: 8.1 % (ref 0.0–14.0)
NEUT#: 9 10*3/uL — ABNORMAL HIGH (ref 1.5–6.5)
NEUT%: 74.7 % (ref 38.4–76.8)
PLATELETS: 213 10*3/uL (ref 145–400)
RBC: 3.37 10*6/uL — AB (ref 3.70–5.45)
RDW: 19.2 % — ABNORMAL HIGH (ref 11.2–14.5)
WBC: 12 10*3/uL — ABNORMAL HIGH (ref 3.9–10.3)
lymph#: 1.9 10*3/uL (ref 0.9–3.3)

## 2016-04-08 LAB — CEA (IN HOUSE-CHCC): CEA (CHCC-IN HOUSE): 24.25 ng/mL — AB (ref 0.00–5.00)

## 2016-04-08 MED ORDER — ATROPINE SULFATE 1 MG/ML IJ SOLN: 0.5000 mg | Freq: Once | INTRAMUSCULAR | Status: DC | PRN

## 2016-04-08 MED ORDER — BEVACIZUMAB CHEMO INJECTION 400 MG/16ML
5.0000 mg/kg | Freq: Once | INTRAVENOUS | Status: AC
  Administered 2016-04-08: 400 mg via INTRAVENOUS
  Filled 2016-04-08: qty 16

## 2016-04-08 MED ORDER — SODIUM CHLORIDE 0.9 % IV SOLN
Freq: Once | INTRAVENOUS | Status: AC
  Administered 2016-04-08: 09:00:00 via INTRAVENOUS

## 2016-04-08 MED ORDER — HEPARIN SOD (PORK) LOCK FLUSH 100 UNIT/ML IV SOLN
500.0000 [IU] | Freq: Once | INTRAVENOUS | Status: DC | PRN
  Filled 2016-04-08: qty 5

## 2016-04-08 MED ORDER — DEXAMETHASONE SODIUM PHOSPHATE 10 MG/ML IJ SOLN
INTRAMUSCULAR | Status: AC
  Filled 2016-04-08: qty 1

## 2016-04-08 MED ORDER — SODIUM CHLORIDE 0.9% FLUSH
10.0000 mL | INTRAVENOUS | Status: DC | PRN
Start: 2016-04-08 — End: 2016-04-08
  Filled 2016-04-08: qty 10

## 2016-04-08 MED ORDER — LEUCOVORIN CALCIUM INJECTION 350 MG
400.0000 mg/m2 | Freq: Once | INTRAMUSCULAR | Status: AC
  Administered 2016-04-08: 752 mg via INTRAVENOUS
  Filled 2016-04-08: qty 37.6

## 2016-04-08 MED ORDER — PALONOSETRON HCL INJECTION 0.25 MG/5ML
INTRAVENOUS | Status: AC
  Filled 2016-04-08: qty 5

## 2016-04-08 MED ORDER — SODIUM CHLORIDE 0.9 % IV SOLN
2400.0000 mg/m2 | INTRAVENOUS | Status: DC
  Administered 2016-04-08: 4500 mg via INTRAVENOUS
  Filled 2016-04-08: qty 90

## 2016-04-08 MED ORDER — DEXAMETHASONE SODIUM PHOSPHATE 10 MG/ML IJ SOLN
10.0000 mg | Freq: Once | INTRAMUSCULAR | Status: AC
  Administered 2016-04-08: 10 mg via INTRAVENOUS

## 2016-04-08 MED ORDER — SODIUM CHLORIDE 0.9 % IJ SOLN
10.0000 mL | INTRAMUSCULAR | Status: DC | PRN
  Administered 2016-04-08: 10 mL via INTRAVENOUS
  Filled 2016-04-08: qty 10

## 2016-04-08 MED ORDER — PALONOSETRON HCL INJECTION 0.25 MG/5ML
0.2500 mg | Freq: Once | INTRAVENOUS | Status: AC
  Administered 2016-04-08: 0.25 mg via INTRAVENOUS

## 2016-04-08 MED ORDER — FLUOROURACIL CHEMO INJECTION 2.5 GM/50ML
400.0000 mg/m2 | Freq: Once | INTRAVENOUS | Status: AC
  Administered 2016-04-08: 750 mg via INTRAVENOUS
  Filled 2016-04-08: qty 15

## 2016-04-08 NOTE — Progress Notes (Signed)
Hematology and Oncology Follow Up Visit  Keia M Dake [id number] 05/06/1951    04/08/16   Principle Diagnosis: 65-year-old woman diagnosed with colon cancer in June 2014. She had presented with an abdominal pain and found to have a cecal mass resulting in obstruction and appendicitis. She had Likely has stage IV disease with a liver mass.    Prior Therapy:  She is status post laparoscopic laparotomy, evacuation of a pelvic abscess and right hemicolectomy with ileocolonic anastomosis done on December 09, 2012. The pathology showed an invasive, well- differentiated colorectal adenocarcinoma. Tumor invades through the muscularis propria, with 2/18 lymph nodes involved, with the pathological staging T3 N1b disease. Her tumor was found to be microsatellite stable. KRAS wild type.   She is also status post Port-A-Cath insertion that was done on 01/05/2013.  FOLFOX chemotherapy started 01/18/2013. Avastin to be added with cycle 2 on 02/01/2013. She is S/P 12 cycles completed in 05/2013.   She is a status post microwave ablation of metastatic lesion in the posterior segment of the right lobe of the liver completed on 09/28/2014 and repeated on 11/30/2014.  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   Current therapy: FOLFIRI and a Avastin salvage therapy started on 12/10/2015. She is here for cycle 9 of therapy.  Interim History:  Ms. Visser presents today for a followup visit. Since her last visit, she reports no major changes in her health. She continues to report few complications associated with chemotherapy. She is reporting more sensory neuropathy in her hands and feet. She is reporting more fatigue as well as nausea. She did report one episode of vomiting which have resolved at this time. She remains reasonably active and continues to perform work-related duties. She denied any abdominal pain or early satiety. She continues to  receive Neulasta without any complications including arthralgias or myalgias.    She is not report any headaches, blurry vision, syncope or seizures. She does not report any fevers, chills or sweats. She does not report any chest pain, palpitation or orthopnea. She does not report any wheezing, hemoptysis or hematemesis. She does not report any frequency urgency or hesitancy. Does not report any lymphadenopathy or petechiae. Rest of her review of systems unremarkable.   Medications: I have reviewed the patient's current medications.  Current Outpatient Prescriptions  Medication Sig Dispense Refill  . acetaminophen (TYLENOL) 500 MG tablet Take 500 mg by mouth every 6 (six) hours as needed for pain.      . diphenhydrAMINE (BENADRYL) 25 mg capsule Take 25-50 mg by mouth daily as needed for itching. For allergic reaction      . lidocaine-prilocaine (EMLA) cream Apply 1 application topically as needed. Apply approx 1/2 tsp to skin over port, prior to chemotherapy treatments  1 kit  3  . Multiple Vitamin (MULTIVITAMIN) tablet Take 1 tablet by mouth daily.      . ondansetron (ZOFRAN) 8 MG tablet Take 1 tablet (8 mg total) by mouth every 8 (eight) hours as needed for nausea.  30 tablet  1   No current facility-administered medications for this visit.     Allergies: No Known Allergies  Past Medical History, Surgical history, Social history, and Family History were reviewed and updated.    Physical Exam:  Blood pressure 139/84, pulse 86, temperature 98.3 F (36.8 C), temperature source Oral, resp. rate 18, height 5' 4" (1.626 m), weight 169 lb 3.2 oz (76.7 kg), SpO2 100 %.   ECOG: 0 General appearance: Well-appearing woman without distress. Head: Normocephalic, without obvious abnormality. No oral rash noted. Neck: no adenopathy. Thyroid masses. Lymph nodes: Cervical, supraclavicular, and axillary nodes normal. Heart:regular rate and rhythm, S1, S2 normal, no murmur, rubs or  gallops. Lung:chest clear, no wheezing, rales, normal symmetric air entry no dullness to percussion. Chest wall examination: Revealed Port-A-Cath site appeared without abnormalities. Abdomen: soft, non-tender, without masses or nodularity noted. No shifting dullness or ascites. No splenomegaly. EXT:no erythema, induration, or nodules Neurological: No new neurological deficits noted.  CBC    Component Value Date/Time   WBC 12.0 (H) 04/08/2016 0808   WBC 4.0 11/20/2015 0735   RBC 3.37 (L) 04/08/2016 0808   RBC 3.95 11/20/2015 0735   HGB 10.9 (L) 04/08/2016 0808   HCT 33.5 (L) 04/08/2016 0808   PLT 213 04/08/2016 0808   MCV 99.4 04/08/2016 0808   MCH 32.3 04/08/2016 0808   MCH 30.6 11/20/2015 0735   MCHC 32.5 04/08/2016 0808   MCHC 34.1 11/20/2015 0735   RDW 19.2 (H) 04/08/2016 0808   LYMPHSABS 1.9 04/08/2016 0808   MONOABS 1.0 (H) 04/08/2016 0808   EOSABS 0.1 04/08/2016 0808   BASOSABS 0.0 04/08/2016 0808      Impression and Plan:  65-year-old woman with the following issues:   1. Advanced Colon cancer. She presented with a large right cecal mass causing obstruction and abscess and appendicitis. After surgical resection she received 12 cycles of chemotherapy with PET/CT scan on 09/12/2013 showed no residual disease actually in the liver that is hypermetabolic.  She is status post liver directed therapy for metastatic disease and to the liver stream and was given in June 2016.  Her PET/CT scan on 11/08/2015 showed progressive omental and peritoneal metastasis. Biopsy proven to be adenocarcinoma of the colon that is KRAS wild-type but NRAS mutated.  She is on salvage FOLFIRI and a Avastin that is well-tolerated. CT scan on 02/05/2016 showed stable disease and the plan is to continue with the same dose and schedule of this current regimen.  CT scan obtain on 04/07/2016 showed slight regression in her peritoneal tumor metastasis although the official report is not available at  this time. We have discussed different strategies moving forward including continuing chemotherapy at full doses versus maintenance 5-FU and leucovorin. After discussion today, she is agreeable to continue chemotherapy with 5-FU, leucovorin and a Avastin. We will repeat imaging studies in 2 months.  2. Intravenous access. Port-A-Cath is is otherwise without complications. She denied any pain or discomfort.  3. Neuropathy: Appears to have worsened at this time on chemotherapy. Discontinuation of CPT-11 should help with these symptoms.  4. Antiemetics: Compazine is available to her and discontinuation of CPT-11 should help moving forward.  5. Neutropenia: Neulasta will be discontinued at this time.  6. Followup: Will be in 2 weeks for cycle 10 of chemotherapy.  SHADAD,FIRAS MD 04/08/16   

## 2016-04-08 NOTE — Patient Instructions (Signed)

## 2016-04-08 NOTE — Patient Instructions (Signed)
[facility] Discharge Instructions for Patients Receiving Chemotherapy  Today you received the following chemotherapy agents Avastin, Leucovorin, Adrucil  To help prevent nausea and vomiting after your treatment, we encourage you to take your nausea medication    If you develop nausea and vomiting that is not controlled by your nausea medication, call the clinic.   BELOW ARE SYMPTOMS THAT SHOULD BE REPORTED IMMEDIATELY:  *FEVER GREATER THAN 100.5 F  *CHILLS WITH OR WITHOUT FEVER  NAUSEA AND VOMITING THAT IS NOT CONTROLLED WITH YOUR NAUSEA MEDICATION  *UNUSUAL SHORTNESS OF BREATH  *UNUSUAL BRUISING OR BLEEDING  TENDERNESS IN MOUTH AND THROAT WITH OR WITHOUT PRESENCE OF ULCERS  *URINARY PROBLEMS  *BOWEL PROBLEMS  UNUSUAL RASH Items with * indicate a potential emergency and should be followed up as soon as possible.  Feel free to call the clinic you have any questions or concerns. The clinic phone number is [phone].  Please show the [place] at check-in to the Emergency Department and triage nurse.

## 2016-04-08 NOTE — Telephone Encounter (Signed)
GAVE PATIENT AVS REPORT AND APPOINTMENTS FOR October AND November  °

## 2016-04-10 ENCOUNTER — Ambulatory Visit (HOSPITAL_BASED_OUTPATIENT_CLINIC_OR_DEPARTMENT_OTHER): Payer: Medicare Other

## 2016-04-10 ENCOUNTER — Ambulatory Visit: Payer: Medicare Other

## 2016-04-10 VITALS — BP 140/71 | HR 82 | Temp 98.1°F | Resp 18

## 2016-04-10 DIAGNOSIS — C180 Malignant neoplasm of cecum: Secondary | ICD-10-CM | POA: Diagnosis not present

## 2016-04-10 DIAGNOSIS — C189 Malignant neoplasm of colon, unspecified: Secondary | ICD-10-CM

## 2016-04-10 DIAGNOSIS — Z452 Encounter for adjustment and management of vascular access device: Secondary | ICD-10-CM

## 2016-04-10 DIAGNOSIS — C787 Secondary malignant neoplasm of liver and intrahepatic bile duct: Secondary | ICD-10-CM

## 2016-04-10 DIAGNOSIS — Z95828 Presence of other vascular implants and grafts: Secondary | ICD-10-CM

## 2016-04-10 MED ORDER — HEPARIN SOD (PORK) LOCK FLUSH 100 UNIT/ML IV SOLN
500.0000 [IU] | Freq: Once | INTRAVENOUS | Status: AC | PRN
  Administered 2016-04-10: 500 [IU] via INTRAVENOUS
  Filled 2016-04-10: qty 5

## 2016-04-10 MED ORDER — PEGFILGRASTIM INJECTION 6 MG/0.6ML
6.0000 mg | Freq: Once | SUBCUTANEOUS | Status: DC
  Filled 2016-04-10: qty 0.6

## 2016-04-10 MED ORDER — SODIUM CHLORIDE 0.9 % IJ SOLN
10.0000 mL | INTRAMUSCULAR | Status: DC | PRN
  Administered 2016-04-10: 10 mL via INTRAVENOUS
  Filled 2016-04-10: qty 10

## 2016-04-10 NOTE — Patient Instructions (Signed)

## 2016-04-15 ENCOUNTER — Encounter: Payer: Self-pay | Admitting: *Deleted

## 2016-04-15 NOTE — Progress Notes (Signed)
QI encounter 

## 2016-04-22 ENCOUNTER — Ambulatory Visit (HOSPITAL_BASED_OUTPATIENT_CLINIC_OR_DEPARTMENT_OTHER): Payer: Medicare Other

## 2016-04-22 ENCOUNTER — Other Ambulatory Visit (HOSPITAL_BASED_OUTPATIENT_CLINIC_OR_DEPARTMENT_OTHER): Payer: Medicare Other

## 2016-04-22 VITALS — BP 132/69 | HR 79 | Temp 97.8°F | Resp 16

## 2016-04-22 DIAGNOSIS — Z5111 Encounter for antineoplastic chemotherapy: Secondary | ICD-10-CM | POA: Diagnosis not present

## 2016-04-22 DIAGNOSIS — C180 Malignant neoplasm of cecum: Secondary | ICD-10-CM

## 2016-04-22 DIAGNOSIS — C786 Secondary malignant neoplasm of retroperitoneum and peritoneum: Secondary | ICD-10-CM

## 2016-04-22 DIAGNOSIS — Z79899 Other long term (current) drug therapy: Secondary | ICD-10-CM | POA: Diagnosis not present

## 2016-04-22 DIAGNOSIS — C189 Malignant neoplasm of colon, unspecified: Secondary | ICD-10-CM

## 2016-04-22 DIAGNOSIS — Z5112 Encounter for antineoplastic immunotherapy: Secondary | ICD-10-CM

## 2016-04-22 DIAGNOSIS — K769 Liver disease, unspecified: Secondary | ICD-10-CM

## 2016-04-22 DIAGNOSIS — C787 Secondary malignant neoplasm of liver and intrahepatic bile duct: Principal | ICD-10-CM

## 2016-04-22 DIAGNOSIS — K6389 Other specified diseases of intestine: Secondary | ICD-10-CM

## 2016-04-22 LAB — COMPREHENSIVE METABOLIC PANEL
ALBUMIN: 3.2 g/dL — AB (ref 3.5–5.0)
ALK PHOS: 94 U/L (ref 40–150)
ALT: 13 U/L (ref 0–55)
AST: 17 U/L (ref 5–34)
Anion Gap: 8 mEq/L (ref 3–11)
BILIRUBIN TOTAL: 1.23 mg/dL — AB (ref 0.20–1.20)
BUN: 17.1 mg/dL (ref 7.0–26.0)
CO2: 22 mEq/L (ref 22–29)
Calcium: 9 mg/dL (ref 8.4–10.4)
Chloride: 111 mEq/L — ABNORMAL HIGH (ref 98–109)
Creatinine: 0.9 mg/dL (ref 0.6–1.1)
EGFR: 81 mL/min/{1.73_m2} — ABNORMAL LOW (ref >90)
GLUCOSE: 90 mg/dL (ref 70–140)
Potassium: 4 mEq/L (ref 3.5–5.1)
SODIUM: 140 meq/L (ref 136–145)
TOTAL PROTEIN: 7.2 g/dL (ref 6.4–8.3)

## 2016-04-22 LAB — CBC WITH DIFFERENTIAL/PLATELET
BASO%: 0.7 % (ref 0.0–2.0)
BASOS ABS: 0 10*3/uL (ref 0.0–0.1)
EOS%: 3.3 % (ref 0.0–7.0)
Eosinophils Absolute: 0.1 10*3/uL (ref 0.0–0.5)
HCT: 33.2 % — ABNORMAL LOW (ref 34.8–46.6)
HEMOGLOBIN: 10.8 g/dL — AB (ref 11.6–15.9)
LYMPH#: 1.2 10*3/uL (ref 0.9–3.3)
LYMPH%: 28.9 % (ref 14.0–49.7)
MCH: 32.2 pg (ref 25.1–34.0)
MCHC: 32.5 g/dL (ref 31.5–36.0)
MCV: 99.1 fL (ref 79.5–101.0)
MONO#: 0.7 10*3/uL (ref 0.1–0.9)
MONO%: 15.3 % — ABNORMAL HIGH (ref 0.0–14.0)
NEUT%: 51.8 % (ref 38.4–76.8)
NEUTROS ABS: 2.2 10*3/uL (ref 1.5–6.5)
NRBC: 0 % (ref 0–0)
Platelets: 244 10*3/uL (ref 145–400)
RBC: 3.35 10*6/uL — ABNORMAL LOW (ref 3.70–5.45)
RDW: 17.5 % — AB (ref 11.2–14.5)
WBC: 4.3 10*3/uL (ref 3.9–10.3)

## 2016-04-22 LAB — CEA (IN HOUSE-CHCC): CEA (CHCC-In House): 18.24 ng/mL — ABNORMAL HIGH (ref 0.00–5.00)

## 2016-04-22 LAB — UA PROTEIN, DIPSTICK - CHCC: Protein, ur: NEGATIVE mg/dL

## 2016-04-22 MED ORDER — DEXAMETHASONE SODIUM PHOSPHATE 10 MG/ML IJ SOLN
10.0000 mg | Freq: Once | INTRAMUSCULAR | Status: AC
  Administered 2016-04-22: 10 mg via INTRAVENOUS

## 2016-04-22 MED ORDER — SODIUM CHLORIDE 0.9 % IV SOLN
5.0000 mg/kg | Freq: Once | INTRAVENOUS | Status: AC
  Administered 2016-04-22: 400 mg via INTRAVENOUS
  Filled 2016-04-22: qty 16

## 2016-04-22 MED ORDER — ATROPINE SULFATE 1 MG/ML IJ SOLN: 0.5000 mg | Freq: Once | INTRAMUSCULAR | Status: DC | PRN

## 2016-04-22 MED ORDER — PALONOSETRON HCL INJECTION 0.25 MG/5ML
INTRAVENOUS | Status: AC
  Filled 2016-04-22: qty 5

## 2016-04-22 MED ORDER — SODIUM CHLORIDE 0.9 % IV SOLN
Freq: Once | INTRAVENOUS | Status: AC
  Administered 2016-04-22: 10:00:00 via INTRAVENOUS

## 2016-04-22 MED ORDER — PALONOSETRON HCL INJECTION 0.25 MG/5ML
0.2500 mg | Freq: Once | INTRAVENOUS | Status: AC
  Administered 2016-04-22: 0.25 mg via INTRAVENOUS

## 2016-04-22 MED ORDER — SODIUM CHLORIDE 0.9 % IV SOLN
2400.0000 mg/m2 | INTRAVENOUS | Status: DC
  Administered 2016-04-22: 4500 mg via INTRAVENOUS
  Filled 2016-04-22: qty 90

## 2016-04-22 MED ORDER — FLUOROURACIL CHEMO INJECTION 2.5 GM/50ML
400.0000 mg/m2 | Freq: Once | INTRAVENOUS | Status: AC
  Administered 2016-04-22: 750 mg via INTRAVENOUS
  Filled 2016-04-22: qty 15

## 2016-04-22 MED ORDER — DEXAMETHASONE SODIUM PHOSPHATE 10 MG/ML IJ SOLN
INTRAMUSCULAR | Status: AC
Start: 2016-04-22 — End: 2016-04-22
  Filled 2016-04-22: qty 1

## 2016-04-22 MED ORDER — LEUCOVORIN CALCIUM INJECTION 350 MG
400.0000 mg/m2 | Freq: Once | INTRAVENOUS | Status: AC
  Administered 2016-04-22: 752 mg via INTRAVENOUS
  Filled 2016-04-22: qty 37.6

## 2016-04-22 NOTE — Patient Instructions (Addendum)
[facility] Discharge Instructions for Patients Receiving Chemotherapy  Today you received the following chemotherapy agents avastin/leucovorin/florouracil   To help prevent nausea and vomiting after your treatment, we encourage you to take your nausea medication as directed  If you develop nausea and vomiting that is not controlled by your nausea medication, call the clinic.   BELOW ARE SYMPTOMS THAT SHOULD BE REPORTED IMMEDIATELY:  *FEVER GREATER THAN 100.5 F  *CHILLS WITH OR WITHOUT FEVER  NAUSEA AND VOMITING THAT IS NOT CONTROLLED WITH YOUR NAUSEA MEDICATION  *UNUSUAL SHORTNESS OF BREATH  *UNUSUAL BRUISING OR BLEEDING  TENDERNESS IN MOUTH AND THROAT WITH OR WITHOUT PRESENCE OF ULCERS  *URINARY PROBLEMS  *BOWEL PROBLEMS  UNUSUAL RASH Items with * indicate a potential emergency and should be followed up as soon as possible.  Feel free to call the clinic you have any questions or concerns. The clinic phone number is [phone].  

## 2016-04-24 ENCOUNTER — Ambulatory Visit (HOSPITAL_BASED_OUTPATIENT_CLINIC_OR_DEPARTMENT_OTHER): Payer: Medicare Other

## 2016-04-24 VITALS — BP 116/64 | HR 85 | Temp 98.7°F | Resp 16

## 2016-04-24 DIAGNOSIS — C180 Malignant neoplasm of cecum: Secondary | ICD-10-CM | POA: Diagnosis not present

## 2016-04-24 DIAGNOSIS — Z452 Encounter for adjustment and management of vascular access device: Secondary | ICD-10-CM

## 2016-04-24 DIAGNOSIS — K6389 Other specified diseases of intestine: Secondary | ICD-10-CM

## 2016-04-24 DIAGNOSIS — C189 Malignant neoplasm of colon, unspecified: Secondary | ICD-10-CM

## 2016-04-24 DIAGNOSIS — K769 Liver disease, unspecified: Secondary | ICD-10-CM

## 2016-04-24 MED ORDER — SODIUM CHLORIDE 0.9% FLUSH
10.0000 mL | INTRAVENOUS | Status: DC | PRN
  Administered 2016-04-24: 10 mL
  Filled 2016-04-24: qty 10

## 2016-04-24 MED ORDER — HEPARIN SOD (PORK) LOCK FLUSH 100 UNIT/ML IV SOLN
500.0000 [IU] | Freq: Once | INTRAVENOUS | Status: AC | PRN
  Administered 2016-04-24: 500 [IU]
  Filled 2016-04-24: qty 5

## 2016-05-06 ENCOUNTER — Ambulatory Visit (HOSPITAL_BASED_OUTPATIENT_CLINIC_OR_DEPARTMENT_OTHER): Payer: Medicare Other

## 2016-05-06 ENCOUNTER — Ambulatory Visit: Payer: Medicare Other

## 2016-05-06 ENCOUNTER — Ambulatory Visit (HOSPITAL_BASED_OUTPATIENT_CLINIC_OR_DEPARTMENT_OTHER): Payer: Medicare Other | Admitting: Oncology

## 2016-05-06 ENCOUNTER — Other Ambulatory Visit (HOSPITAL_BASED_OUTPATIENT_CLINIC_OR_DEPARTMENT_OTHER): Payer: Medicare Other

## 2016-05-06 ENCOUNTER — Telehealth: Payer: Self-pay | Admitting: Oncology

## 2016-05-06 VITALS — BP 145/84 | HR 66

## 2016-05-06 VITALS — BP 150/83 | HR 82 | Temp 97.8°F | Resp 18 | Wt 166.9 lb

## 2016-05-06 DIAGNOSIS — K6389 Other specified diseases of intestine: Secondary | ICD-10-CM

## 2016-05-06 DIAGNOSIS — Z5111 Encounter for antineoplastic chemotherapy: Secondary | ICD-10-CM

## 2016-05-06 DIAGNOSIS — C786 Secondary malignant neoplasm of retroperitoneum and peritoneum: Secondary | ICD-10-CM | POA: Diagnosis not present

## 2016-05-06 DIAGNOSIS — C180 Malignant neoplasm of cecum: Secondary | ICD-10-CM

## 2016-05-06 DIAGNOSIS — C189 Malignant neoplasm of colon, unspecified: Secondary | ICD-10-CM

## 2016-05-06 DIAGNOSIS — C787 Secondary malignant neoplasm of liver and intrahepatic bile duct: Secondary | ICD-10-CM | POA: Diagnosis not present

## 2016-05-06 DIAGNOSIS — K769 Liver disease, unspecified: Secondary | ICD-10-CM

## 2016-05-06 DIAGNOSIS — Z5112 Encounter for antineoplastic immunotherapy: Secondary | ICD-10-CM | POA: Diagnosis present

## 2016-05-06 DIAGNOSIS — G609 Hereditary and idiopathic neuropathy, unspecified: Secondary | ICD-10-CM | POA: Diagnosis not present

## 2016-05-06 LAB — COMPREHENSIVE METABOLIC PANEL
ALT: 11 U/L (ref 0–55)
ANION GAP: 9 meq/L (ref 3–11)
AST: 18 U/L (ref 5–34)
Albumin: 3.3 g/dL — ABNORMAL LOW (ref 3.5–5.0)
Alkaline Phosphatase: 78 U/L (ref 40–150)
BUN: 12.6 mg/dL (ref 7.0–26.0)
CHLORIDE: 109 meq/L (ref 98–109)
CO2: 22 meq/L (ref 22–29)
CREATININE: 1 mg/dL (ref 0.6–1.1)
Calcium: 9.2 mg/dL (ref 8.4–10.4)
EGFR: 70 mL/min/{1.73_m2} — ABNORMAL LOW (ref >90)
Glucose: 111 mg/dl (ref 70–140)
POTASSIUM: 4 meq/L (ref 3.5–5.1)
Sodium: 139 mEq/L (ref 136–145)
Total Bilirubin: 0.93 mg/dL (ref 0.20–1.20)
Total Protein: 7 g/dL (ref 6.4–8.3)

## 2016-05-06 LAB — CBC WITH DIFFERENTIAL/PLATELET
BASO%: 1.6 % (ref 0.0–2.0)
Basophils Absolute: 0.1 10*3/uL (ref 0.0–0.1)
EOS%: 4.5 % (ref 0.0–7.0)
Eosinophils Absolute: 0.1 10*3/uL (ref 0.0–0.5)
HCT: 35.1 % (ref 34.8–46.6)
HGB: 11.4 g/dL — ABNORMAL LOW (ref 11.6–15.9)
LYMPH%: 35.8 % (ref 14.0–49.7)
MCH: 32.6 pg (ref 25.1–34.0)
MCHC: 32.5 g/dL (ref 31.5–36.0)
MCV: 100.3 fL (ref 79.5–101.0)
MONO#: 0.5 10*3/uL (ref 0.1–0.9)
MONO%: 14.4 % — AB (ref 0.0–14.0)
NEUT%: 43.7 % (ref 38.4–76.8)
NEUTROS ABS: 1.4 10*3/uL — AB (ref 1.5–6.5)
Platelets: 240 10*3/uL (ref 145–400)
RBC: 3.5 10*6/uL — AB (ref 3.70–5.45)
RDW: 15.6 % — AB (ref 11.2–14.5)
WBC: 3.1 10*3/uL — AB (ref 3.9–10.3)
lymph#: 1.1 10*3/uL (ref 0.9–3.3)

## 2016-05-06 LAB — CEA (IN HOUSE-CHCC): CEA (CHCC-In House): 10.62 ng/mL — ABNORMAL HIGH (ref 0.00–5.00)

## 2016-05-06 MED ORDER — PALONOSETRON HCL INJECTION 0.25 MG/5ML
INTRAVENOUS | Status: AC
Start: 2016-05-06 — End: 2016-05-06
  Filled 2016-05-06: qty 5

## 2016-05-06 MED ORDER — SODIUM CHLORIDE 0.9 % IV SOLN
Freq: Once | INTRAVENOUS | Status: AC
  Administered 2016-05-06: 10:00:00 via INTRAVENOUS

## 2016-05-06 MED ORDER — DEXAMETHASONE SODIUM PHOSPHATE 10 MG/ML IJ SOLN
10.0000 mg | Freq: Once | INTRAMUSCULAR | Status: AC
  Administered 2016-05-06: 10 mg via INTRAVENOUS

## 2016-05-06 MED ORDER — SODIUM CHLORIDE 0.9 % IV SOLN
2400.0000 mg/m2 | INTRAVENOUS | Status: DC
  Administered 2016-05-06: 4500 mg via INTRAVENOUS
  Filled 2016-05-06: qty 90

## 2016-05-06 MED ORDER — DEXTROSE 5 % IV SOLN
400.0000 mg/m2 | Freq: Once | INTRAVENOUS | Status: AC
  Administered 2016-05-06: 752 mg via INTRAVENOUS
  Filled 2016-05-06: qty 37.6

## 2016-05-06 MED ORDER — PALONOSETRON HCL INJECTION 0.25 MG/5ML
0.2500 mg | Freq: Once | INTRAVENOUS | Status: AC
  Administered 2016-05-06: 0.25 mg via INTRAVENOUS

## 2016-05-06 MED ORDER — FLUOROURACIL CHEMO INJECTION 2.5 GM/50ML
400.0000 mg/m2 | Freq: Once | INTRAVENOUS | Status: AC
  Administered 2016-05-06: 750 mg via INTRAVENOUS
  Filled 2016-05-06: qty 15

## 2016-05-06 MED ORDER — SODIUM CHLORIDE 0.9 % IV SOLN
5.0000 mg/kg | Freq: Once | INTRAVENOUS | Status: AC
  Administered 2016-05-06: 400 mg via INTRAVENOUS
  Filled 2016-05-06: qty 16

## 2016-05-06 MED ORDER — DEXAMETHASONE SODIUM PHOSPHATE 10 MG/ML IJ SOLN
INTRAMUSCULAR | Status: AC
  Filled 2016-05-06: qty 1

## 2016-05-06 NOTE — Progress Notes (Signed)
OK to treat today with ANC-1.4 per Dr. Alen Blew.

## 2016-05-06 NOTE — Patient Instructions (Signed)
[facility] Discharge Instructions for Patients Receiving Chemotherapy  Today you received the following chemotherapy agents Avastin, Leucovorin, Adrucil  To help prevent nausea and vomiting after your treatment, we encourage you to take your nausea medication    If you develop nausea and vomiting that is not controlled by your nausea medication, call the clinic.   BELOW ARE SYMPTOMS THAT SHOULD BE REPORTED IMMEDIATELY:  *FEVER GREATER THAN 100.5 F  *CHILLS WITH OR WITHOUT FEVER  NAUSEA AND VOMITING THAT IS NOT CONTROLLED WITH YOUR NAUSEA MEDICATION  *UNUSUAL SHORTNESS OF BREATH  *UNUSUAL BRUISING OR BLEEDING  TENDERNESS IN MOUTH AND THROAT WITH OR WITHOUT PRESENCE OF ULCERS  *URINARY PROBLEMS  *BOWEL PROBLEMS  UNUSUAL RASH Items with * indicate a potential emergency and should be followed up as soon as possible.  Feel free to call the clinic you have any questions or concerns. The clinic phone number is [phone].  Please show the [place] at check-in to the Emergency Department and triage nurse.

## 2016-05-06 NOTE — Progress Notes (Signed)
Hematology and Oncology Follow Up Visit  Margaret Elliott [id number] Mar 02, 1951    05/06/16   Principle Diagnosis: 65 year old woman diagnosed with colon cancer in June 2014. She had presented with an abdominal pain and found to have a cecal mass resulting in obstruction and appendicitis. She had Likely has stage IV disease with a liver mass.    Prior Therapy:  She is status post laparoscopic laparotomy, evacuation of a pelvic abscess and right hemicolectomy with ileocolonic anastomosis done on December 09, 2012. The pathology showed an invasive, well- differentiated colorectal adenocarcinoma. Tumor invades through the muscularis propria, with 2/18 lymph nodes involved, with the pathological staging T3 N1b disease. Her tumor was found to be microsatellite stable. KRAS wild type.   She is also status post Port-A-Cath insertion that was done on 01/05/2013.  FOLFOX chemotherapy started 01/18/2013. Avastin to be added with cycle 2 on 02/01/2013. She is S/P 12 cycles completed in 05/2013.   She is a status post microwave ablation of metastatic lesion in the posterior segment of the right lobe of the liver completed on 09/28/2014 and repeated on 11/30/2014.  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   Current therapy: FOLFIRI and a Avastin salvage therapy started on 12/10/2015. She has received 5-FU, leucovorin and a Avastin only starting with cycle 10. She is here for cycle 11 of therapy.  Interim History:  Margaret Elliott presents today for a followup visit. Since her last visit, she feels reasonably well. She received the last chemotherapy without CPT-11 and made a big difference in her quality of life. She reports less fatigue, tiredness and more energy. She remains reasonably active and continues to perform work-related duties. She denied any abdominal pain or early satiety. She denied any diarrhea, hematochezia or change in her bowel  habits. She is able to exercise more regularly at this time with better endurance.   She is not report any headaches, blurry vision, syncope or seizures. She does not report any fevers, chills or sweats. She does not report any chest pain, palpitation or orthopnea. She does not report any wheezing, hemoptysis or hematemesis. She does not report any frequency urgency or hesitancy. Does not report any lymphadenopathy or petechiae. Rest of her review of systems unremarkable.   Medications: I have reviewed the patient's current medications.  Current Outpatient Prescriptions  Medication Sig Dispense Refill  . acetaminophen (TYLENOL) 500 MG tablet Take 500 mg by mouth every 6 (six) hours as needed for pain.      . diphenhydrAMINE (BENADRYL) 25 mg capsule Take 25-50 mg by mouth daily as needed for itching. For allergic reaction      . lidocaine-prilocaine (EMLA) cream Apply 1 application topically as needed. Apply approx 1/2 tsp to skin over port, prior to chemotherapy treatments  1 kit  3  . Multiple Vitamin (MULTIVITAMIN) tablet Take 1 tablet by mouth daily.      . ondansetron (ZOFRAN) 8 MG tablet Take 1 tablet (8 mg total) by mouth every 8 (eight) hours as needed for nausea.  30 tablet  1   No current facility-administered medications for this visit.     Allergies: No Known Allergies  Past Medical History, Surgical history, Social history, and Family History were reviewed and updated.    Physical Exam:  Blood pressure (!) 150/83, pulse 82, temperature 97.8 F (36.6 C), temperature source Oral, resp. rate 18, weight 166 lb 14.4 oz (75.7 kg), SpO2 100 %. ECOG:  0 General appearance: Alert, awake woman without distress. Head: Normocephalic, without obvious abnormality. No oral ulcers or thrush. Neck: no adenopathy. Thyroid masses. Lymph nodes: Cervical, supraclavicular, and axillary nodes normal. Heart:regular rate and rhythm, S1, S2 normal, no murmur, rubs or gallops. Lung:chest clear, no  wheezing, rales, normal symmetric air entry no dullness to percussion. Chest wall examination: Revealed Port-A-Cath site appeared without abnormalities. Nontender with palpation. Abdomen: soft, non-tender, without masses or nodularity noted. Rebound or guarding. EXT:no erythema, induration, or nodules Neurological: No new neurological deficits noted.  CBC    Component Value Date/Time   WBC 3.1 (L) 05/06/2016 0836   WBC 4.0 11/20/2015 0735   RBC 3.50 (L) 05/06/2016 0836   RBC 3.95 11/20/2015 0735   HGB 11.4 (L) 05/06/2016 0836   HCT 35.1 05/06/2016 0836   PLT 240 05/06/2016 0836   MCV 100.3 05/06/2016 0836   MCH 32.6 05/06/2016 0836   MCH 30.6 11/20/2015 0735   MCHC 32.5 05/06/2016 0836   MCHC 34.1 11/20/2015 0735   RDW 15.6 (H) 05/06/2016 0836   LYMPHSABS 1.1 05/06/2016 0836   MONOABS 0.5 05/06/2016 0836   EOSABS 0.1 05/06/2016 0836   BASOSABS 0.1 05/06/2016 0836   EXAM: CT CHEST, ABDOMEN, AND PELVIS WITH CONTRAST  TECHNIQUE: Multidetector CT imaging of the chest, abdomen and pelvis was performed following the standard protocol during bolus administration of intravenous contrast.  CONTRAST:  ISOVUE-300 IOPAMIDOL (ISOVUE-300) INJECTION 61%  COMPARISON:  PET-CT 11/08/2015.  Chest abdomen pelvis CT 02/05/2016.  FINDINGS: CT CHEST FINDINGS  Cardiovascular: The heart size is normal. No pericardial effusion. Coronary artery calcification is noted. No thoracic aortic aneurysm.  Mediastinum/Nodes: No mediastinal lymphadenopathy. There is no hilar lymphadenopathy. The esophagus has normal imaging features. There is no axillary lymphadenopathy.  Lungs/Pleura: Lungs are without evidence of edema or fusion. No focal airspace consolidation. 4 mm anterior left upper lobe pulmonary nodule is stable in the interval. No new or enlarging pulmonary nodule or mass.  Musculoskeletal: Bone windows reveal no worrisome lytic or sclerotic osseous lesions.  CT ABDOMEN  PELVIS FINDINGS  Hepatobiliary: Ablation defect posterior right hepatic lobe is stable. There is some irregular peripheral enhancement associated with the defect, but this is unchanged in the interval. Continued close attention on follow-up recommended. Gallbladder is decompressed. No intrahepatic or extrahepatic biliary dilation.  Pancreas: No focal mass lesion. No dilatation of the main duct. No intraparenchymal cyst. No peripancreatic edema.  Spleen: No splenomegaly. No focal mass lesion.  Adrenals/Urinary Tract: No adrenal nodule or mass. Stable appearance right renal cyst. Left kidney has scattered tiny low-density cortical lesions, too small to characterize, but compatible with tiny cysts. No evidence for hydroureter. The urinary bladder appears normal for the degree of distention.  Stomach/Bowel: Tiny hiatal hernia noted. Stomach otherwise unremarkable. Duodenum is normally positioned as is the ligament of Treitz. No small bowel wall thickening. No small bowel dilatation. Patient is status post right hemicolectomy. Diverticular changes are noted in the left colon without evidence of diverticulitis.  Vascular/Lymphatic: No abdominal aortic aneurysm. Note gastrohepatic or hepatoduodenal ligament lymphadenopathy. No retroperitoneal lymphadenopathy. Omental nodularity shows no substantial interval change. Previously measured 17 mm right paramidline omental nodule is visualized on image 70 series 2 today and measures 16 mm. 11 mm left omental nodule (image 68 today) was 12 mm previously. No pelvic sidewall lymphadenopathy.  Reproductive: Uterus is surgically absent. 5.0 x 3.9 cm left adnexal mass on the previous study now measures 5.3 x 4.5 cm, but appears much more cystic  on today's study than before. Right ovarian lesion measures 4.1 x 2.4 cm today compared to 4.9 x 3.5 cm previously and also appears more cystic in nature.  Other: No intraperitoneal free  fluid.  Musculoskeletal: Bone windows reveal no worrisome lytic or sclerotic osseous lesions.  IMPRESSION: 1. Right ovarian lesion has decreased in size in the interval while the left ovarian lesion has slightly increased. Both adnexal lesions appear more cystic on today's study than previously, potentially representing response to therapy. 2. Omental nodularity is stable to minimally decreased in the interval. 3. Stable appearance of posterior hepatic ablation defect. There is some irregular peripheral enhancement, unchanged, but warranting continued close attention on follow-up. 4. No new or progressive findings on today's exam.   Impression and Plan:  65 year old woman with the following issues:   1. Advanced Colon cancer. She presented with a large right cecal mass causing obstruction and abscess and appendicitis. After surgical resection she received 12 cycles of chemotherapy with PET/CT scan on 09/12/2013 showed no residual disease actually in the liver that is hypermetabolic.  She is status post liver directed therapy for metastatic disease and to the liver stream and was given in June 2016.  Her PET/CT scan on 11/08/2015 showed progressive omental and peritoneal metastasis. Biopsy proven to be adenocarcinoma of the colon that is KRAS wild-type but NRAS mutated.  She is on salvage FOLFIRI and a Avastin that is well-tolerated.   CT scan obtain on 04/07/2016 showed positive response to therapy with regression of her omental disease and ovarian disease.  The plan is to continue with systemic chemotherapy utilizing 5-FU, leucovorin and Avastin without CPT-11 at this time. We will assess the duration of therapy before each cycle.  2. Intravenous access. Port-A-Cath is is otherwise without complications. She denied any pain or discomfort.  3. Neuropathy: Appears to have worsened at this time on chemotherapy. Discontinuation of CPT-11 helped her symptoms.  4. Antiemetics:  Compazine is available to her and discontinuation of CPT-11 helped at this time.  5. Neutropenia: Neulasta will be discontinued at this time.  6. Followup: Will be in 2 weeks for cycle 12 of chemotherapy.  [facility] MD 05/06/16

## 2016-05-06 NOTE — Progress Notes (Signed)
Blood return noted before and after Adrucil push.  

## 2016-05-06 NOTE — Telephone Encounter (Signed)
Message sent to chemo scheduler to be added to schedule, per 05/06/16 los. Message also sent to Audie Clear to override/add to Dr Alen Blew schedule for 11/29 & 12/13, per 05/06/16 los. Appointments scheduled per 05/06/16 los. AVS report and appointment schedule given to patient per 05/06/16 los.

## 2016-05-06 NOTE — Progress Notes (Signed)
Okay to treat today, despite Neut = 1.4, per Dr. Alen Blew.

## 2016-05-07 ENCOUNTER — Telehealth: Payer: Self-pay | Admitting: *Deleted

## 2016-05-07 NOTE — Telephone Encounter (Signed)
Per LOS I have scheduled appts and notified the scheduelr  

## 2016-05-08 ENCOUNTER — Ambulatory Visit (HOSPITAL_BASED_OUTPATIENT_CLINIC_OR_DEPARTMENT_OTHER): Payer: Medicare Other

## 2016-05-08 ENCOUNTER — Other Ambulatory Visit: Payer: Self-pay | Admitting: *Deleted

## 2016-05-08 VITALS — BP 135/76 | HR 76 | Temp 98.1°F | Resp 16

## 2016-05-08 DIAGNOSIS — C787 Secondary malignant neoplasm of liver and intrahepatic bile duct: Secondary | ICD-10-CM | POA: Diagnosis not present

## 2016-05-08 DIAGNOSIS — C189 Malignant neoplasm of colon, unspecified: Secondary | ICD-10-CM

## 2016-05-08 DIAGNOSIS — C786 Secondary malignant neoplasm of retroperitoneum and peritoneum: Secondary | ICD-10-CM | POA: Diagnosis not present

## 2016-05-08 DIAGNOSIS — K769 Liver disease, unspecified: Secondary | ICD-10-CM

## 2016-05-08 DIAGNOSIS — K6389 Other specified diseases of intestine: Secondary | ICD-10-CM

## 2016-05-08 MED ORDER — HEPARIN SOD (PORK) LOCK FLUSH 100 UNIT/ML IV SOLN
500.0000 [IU] | Freq: Once | INTRAVENOUS | Status: AC | PRN
  Administered 2016-05-08: 500 [IU]
  Filled 2016-05-08: qty 5

## 2016-05-08 MED ORDER — PROCHLORPERAZINE MALEATE 10 MG PO TABS: 10.0000 mg | ORAL_TABLET | Freq: Four times a day (QID) | ORAL | 2 refills | Status: DC | PRN

## 2016-05-08 MED ORDER — SODIUM CHLORIDE 0.9% FLUSH
10.0000 mL | INTRAVENOUS | Status: DC | PRN
  Administered 2016-05-08: 10 mL
  Filled 2016-05-08: qty 10

## 2016-05-08 NOTE — Progress Notes (Signed)
Patient in for pump disconnect today. Patient states, "I had some discomfort last night with this port. The needle may have come out." Needle noted to be partially out of the PAC. PAC flushed without any difficulty. Patient denies any pain or discomfort. Needle removed and site noted to be without any redness or swelling. Bandaid applied to site. Patient's vital signs are 135/76, 76, 98.1, and O2 sat is 100 percent. Encouraged Patient to call the [place] or go to the Emergency Department if she has any redness, swelling, or pain at or near her [facility] site. Patient discharged from the Flush Room without any difficulty.

## 2016-05-20 ENCOUNTER — Ambulatory Visit (HOSPITAL_BASED_OUTPATIENT_CLINIC_OR_DEPARTMENT_OTHER): Payer: Medicare Other

## 2016-05-20 ENCOUNTER — Ambulatory Visit (HOSPITAL_BASED_OUTPATIENT_CLINIC_OR_DEPARTMENT_OTHER): Payer: Medicare Other | Admitting: Oncology

## 2016-05-20 ENCOUNTER — Telehealth: Payer: Self-pay | Admitting: Oncology

## 2016-05-20 ENCOUNTER — Ambulatory Visit: Payer: Medicare Other

## 2016-05-20 ENCOUNTER — Other Ambulatory Visit (HOSPITAL_BASED_OUTPATIENT_CLINIC_OR_DEPARTMENT_OTHER): Payer: Medicare Other

## 2016-05-20 VITALS — BP 146/82

## 2016-05-20 VITALS — BP 158/84 | HR 81 | Temp 97.9°F | Resp 18 | Ht 64.0 in | Wt 168.4 lb

## 2016-05-20 DIAGNOSIS — Z5111 Encounter for antineoplastic chemotherapy: Secondary | ICD-10-CM | POA: Diagnosis not present

## 2016-05-20 DIAGNOSIS — C787 Secondary malignant neoplasm of liver and intrahepatic bile duct: Secondary | ICD-10-CM

## 2016-05-20 DIAGNOSIS — Z5112 Encounter for antineoplastic immunotherapy: Secondary | ICD-10-CM

## 2016-05-20 DIAGNOSIS — C189 Malignant neoplasm of colon, unspecified: Secondary | ICD-10-CM

## 2016-05-20 DIAGNOSIS — C180 Malignant neoplasm of cecum: Secondary | ICD-10-CM

## 2016-05-20 DIAGNOSIS — G609 Hereditary and idiopathic neuropathy, unspecified: Secondary | ICD-10-CM | POA: Diagnosis not present

## 2016-05-20 DIAGNOSIS — C786 Secondary malignant neoplasm of retroperitoneum and peritoneum: Secondary | ICD-10-CM

## 2016-05-20 DIAGNOSIS — K6389 Other specified diseases of intestine: Secondary | ICD-10-CM

## 2016-05-20 DIAGNOSIS — K769 Liver disease, unspecified: Secondary | ICD-10-CM

## 2016-05-20 LAB — CBC WITH DIFFERENTIAL/PLATELET
BASO%: 0.8 % (ref 0.0–2.0)
BASOS ABS: 0 10*3/uL (ref 0.0–0.1)
EOS ABS: 0.2 10*3/uL (ref 0.0–0.5)
EOS%: 5.3 % (ref 0.0–7.0)
HEMATOCRIT: 36.7 % (ref 34.8–46.6)
HEMOGLOBIN: 12 g/dL (ref 11.6–15.9)
LYMPH#: 1.1 10*3/uL (ref 0.9–3.3)
LYMPH%: 26 % (ref 14.0–49.7)
MCH: 32.6 pg (ref 25.1–34.0)
MCHC: 32.7 g/dL (ref 31.5–36.0)
MCV: 99.7 fL (ref 79.5–101.0)
MONO#: 0.5 10*3/uL (ref 0.1–0.9)
MONO%: 11.7 % (ref 0.0–14.0)
NEUT#: 2.3 10*3/uL (ref 1.5–6.5)
NEUT%: 56.2 % (ref 38.4–76.8)
Platelets: 185 10*3/uL (ref 145–400)
RBC: 3.68 10*6/uL — ABNORMAL LOW (ref 3.70–5.45)
RDW: 15.8 % — AB (ref 11.2–14.5)
WBC: 4.1 10*3/uL (ref 3.9–10.3)

## 2016-05-20 LAB — COMPREHENSIVE METABOLIC PANEL
ALBUMIN: 3.2 g/dL — AB (ref 3.5–5.0)
ALK PHOS: 85 U/L (ref 40–150)
ALT: 17 U/L (ref 0–55)
AST: 21 U/L (ref 5–34)
Anion Gap: 8 mEq/L (ref 3–11)
BUN: 12.3 mg/dL (ref 7.0–26.0)
CALCIUM: 9.2 mg/dL (ref 8.4–10.4)
CHLORIDE: 108 meq/L (ref 98–109)
CO2: 25 mEq/L (ref 22–29)
Creatinine: 0.9 mg/dL (ref 0.6–1.1)
EGFR: 77 mL/min/{1.73_m2} — AB (ref >90)
Glucose: 122 mg/dl (ref 70–140)
POTASSIUM: 3.7 meq/L (ref 3.5–5.1)
Sodium: 141 mEq/L (ref 136–145)
Total Bilirubin: 0.57 mg/dL (ref 0.20–1.20)
Total Protein: 7.2 g/dL (ref 6.4–8.3)

## 2016-05-20 LAB — CEA (IN HOUSE-CHCC): CEA (CHCC-IN HOUSE): 8 ng/mL — AB (ref 0.00–5.00)

## 2016-05-20 MED ORDER — SODIUM CHLORIDE 0.9 % IV SOLN
2400.0000 mg/m2 | INTRAVENOUS | Status: DC
  Administered 2016-05-20: 4500 mg via INTRAVENOUS
  Filled 2016-05-20: qty 90

## 2016-05-20 MED ORDER — SODIUM CHLORIDE 0.9 % IV SOLN
5.0000 mg/kg | Freq: Once | INTRAVENOUS | Status: AC
  Administered 2016-05-20: 400 mg via INTRAVENOUS
  Filled 2016-05-20: qty 16

## 2016-05-20 MED ORDER — FLUOROURACIL CHEMO INJECTION 2.5 GM/50ML
400.0000 mg/m2 | Freq: Once | INTRAVENOUS | Status: AC
  Administered 2016-05-20: 750 mg via INTRAVENOUS
  Filled 2016-05-20: qty 15

## 2016-05-20 MED ORDER — DEXAMETHASONE SODIUM PHOSPHATE 10 MG/ML IJ SOLN
10.0000 mg | Freq: Once | INTRAMUSCULAR | Status: AC
  Administered 2016-05-20: 10 mg via INTRAVENOUS

## 2016-05-20 MED ORDER — DEXAMETHASONE SODIUM PHOSPHATE 10 MG/ML IJ SOLN
INTRAMUSCULAR | Status: AC
  Filled 2016-05-20: qty 1

## 2016-05-20 MED ORDER — PALONOSETRON HCL INJECTION 0.25 MG/5ML
INTRAVENOUS | Status: AC
  Filled 2016-05-20: qty 5

## 2016-05-20 MED ORDER — SODIUM CHLORIDE 0.9 % IV SOLN
Freq: Once | INTRAVENOUS | Status: AC
  Administered 2016-05-20: 11:00:00 via INTRAVENOUS

## 2016-05-20 MED ORDER — LEUCOVORIN CALCIUM INJECTION 350 MG
400.0000 mg/m2 | Freq: Once | INTRAMUSCULAR | Status: AC
  Administered 2016-05-20: 752 mg via INTRAVENOUS
  Filled 2016-05-20: qty 37.6

## 2016-05-20 MED ORDER — PALONOSETRON HCL INJECTION 0.25 MG/5ML
0.2500 mg | Freq: Once | INTRAVENOUS | Status: AC
  Administered 2016-05-20: 0.25 mg via INTRAVENOUS

## 2016-05-20 NOTE — Progress Notes (Signed)
Hematology and Oncology Follow Up Visit  SHANEICE BARSANTI [id number] 11/13/1950    05/20/16   Principle Diagnosis: 65 year old woman diagnosed with colon cancer in June 2014. She had presented with an abdominal pain and found to have a cecal mass resulting in obstruction and appendicitis. She had Likely has stage IV disease with a liver mass.    Prior Therapy:  She is status post laparoscopic laparotomy, evacuation of a pelvic abscess and right hemicolectomy with ileocolonic anastomosis done on December 09, 2012. The pathology showed an invasive, well- differentiated colorectal adenocarcinoma. Tumor invades through the muscularis propria, with 2/18 lymph nodes involved, with the pathological staging T3 N1b disease. Her tumor was found to be microsatellite stable. KRAS wild type.   She is also status post Port-A-Cath insertion that was done on 01/05/2013.  FOLFOX chemotherapy started 01/18/2013. Avastin to be added with cycle 2 on 02/01/2013. She is S/P 12 cycles completed in 05/2013.   She is a status post microwave ablation of metastatic lesion in the posterior segment of the right lobe of the liver completed on 09/28/2014 and repeated on 11/30/2014.  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   Current therapy: FOLFIRI and a Avastin salvage therapy started on 12/10/2015. She has received 5-FU, leucovorin and a Avastin only starting with cycle 10. She is here for cycle 12 of therapy.  Interim History:  Ms. Brinkley presents today for a followup visit. Since her last visit, she continues to do very well and tolerates therapy without complications. She reports less fatigue, tiredness and more energy. She remains reasonably active and continues to perform work-related duties. She denied any abdominal pain or early satiety. She denied any diarrhea, hematochezia or change in her bowel habits. Her appetite remain excellent and has  maintained her weight. She did notice a bruise around her Port-A-Cath after therapy completed the last cycle. She denied any trauma or irritation. She denied any fevers or chills.   She is not report any headaches, blurry vision, syncope or seizures. She does not report any fevers, chills or sweats. She does not report any chest pain, palpitation or orthopnea. She does not report any wheezing, hemoptysis or hematemesis. She does not report any frequency urgency or hesitancy. Does not report any lymphadenopathy or petechiae. Rest of her review of systems unremarkable.   Medications: I have reviewed the patient's current medications.  Current Outpatient Prescriptions  Medication Sig Dispense Refill  . acetaminophen (TYLENOL) 500 MG tablet Take 500 mg by mouth every 6 (six) hours as needed for pain.      . diphenhydrAMINE (BENADRYL) 25 mg capsule Take 25-50 mg by mouth daily as needed for itching. For allergic reaction      . lidocaine-prilocaine (EMLA) cream Apply 1 application topically as needed. Apply approx 1/2 tsp to skin over port, prior to chemotherapy treatments  1 kit  3  . Multiple Vitamin (MULTIVITAMIN) tablet Take 1 tablet by mouth daily.      . ondansetron (ZOFRAN) 8 MG tablet Take 1 tablet (8 mg total) by mouth every 8 (eight) hours as needed for nausea.  30 tablet  1   No current facility-administered medications for this visit.     Allergies: No Known Allergies  Past Medical History, Surgical history, Social history, and Family History were reviewed and updated.    Physical Exam:  Blood pressure (!) 158/84, pulse 81, temperature 97.9 F (36.6 C), temperature source Oral, resp. rate  18, height 5' 4" (1.626 m), weight 168 lb 6.4 oz (76.4 kg), SpO2 100 %. ECOG: 0 General appearance: Well-appearing woman without distress. Head: Normocephalic, without obvious abnormality. No oral ulcers or thrush. Neck: no adenopathy. Thyroid masses. Lymph nodes: Cervical, supraclavicular,  and axillary nodes normal. Heart:regular rate and rhythm, S1, S2 normal, no murmur, rubs or gallops. Lung:chest clear, no wheezing, rales, normal symmetric air entry no dullness to percussion. Chest wall examination: Revealed Port-A-Cath site appeared clean, dry without erythema or induration. Ecchymosis noted inferior to the site. No masses or lesions. Abdomen: soft, non-tender, without masses or nodularity noted. No rebound or guarding. EXT:no erythema, induration, or nodules Neurological: No new neurological deficits noted.  CBC    Component Value Date/Time   WBC 4.1 05/20/2016 0927   WBC 4.0 11/20/2015 0735   RBC 3.68 (L) 05/20/2016 0927   RBC 3.95 11/20/2015 0735   HGB 12.0 05/20/2016 0927   HCT 36.7 05/20/2016 0927   PLT 185 05/20/2016 0927   MCV 99.7 05/20/2016 0927   MCH 32.6 05/20/2016 0927   MCH 30.6 11/20/2015 0735   MCHC 32.7 05/20/2016 0927   MCHC 34.1 11/20/2015 0735   RDW 15.8 (H) 05/20/2016 0927   LYMPHSABS 1.1 05/20/2016 0927   MONOABS 0.5 05/20/2016 0927   EOSABS 0.2 05/20/2016 0927   BASOSABS 0.0 05/20/2016 0927    Results for AMEY, HOSSAIN (MRN [id number]) as of 05/20/2016 10:38  Ref. Range 04/22/2016 08:37 05/06/2016 08:36  CE[facility] ([facility]) Latest Ref Range: 0.00 - 5.00 ng/mL 18.24 (H) 10.62 (H)    Impression and Plan:  65 year old woman with the following issues:   1. Advanced Colon cancer. She presented with a large right cecal mass causing obstruction and abscess and appendicitis. After surgical resection she received 12 cycles of chemotherapy with PET/CT scan on 09/12/2013 showed no residual disease actually in the liver that is hypermetabolic.  She is status post liver directed therapy for metastatic disease and to the liver stream and was given in June 2016.  Her PET/CT scan on 11/08/2015 showed progressive omental and peritoneal metastasis. Biopsy proven to be adenocarcinoma of the colon that is KRAS wild-type but NRAS mutated.  She is on  salvage FOLFIRI and a Avastin that is well-tolerated.   CT scan obtain on 04/07/2016 showed positive response to therapy with regression of her omental disease and ovarian disease.  The plan is to continue with maintenance 5-FU, leucovorin and a Avastin and repeat imaging studies before the end of December.  2. Intravenous access. Port-A-Cath site appeared normal with mild ecchymosis noted. I see no reason not to use the Port-A-Cath for the time being.  3. Neuropathy: Appears to have worsened at this time on chemotherapy. Discontinuation of CPT-11 helped her symptoms.  4. Antiemetics: Compazine is effective at this time.  5. Neutropenia: Neulasta will be discontinued at this time.  6. Followup: Will be in 2 weeks for cycle 13 of chemotherapy.  [facility] MD 05/20/16

## 2016-05-20 NOTE — Patient Instructions (Signed)
[facility] Discharge Instructions for Patients Receiving Chemotherapy  Today you received the following chemotherapy agents avastin/leucovorin/florouracil   To help prevent nausea and vomiting after your treatment, we encourage you to take your nausea medication as directed  If you develop nausea and vomiting that is not controlled by your nausea medication, call the clinic.   BELOW ARE SYMPTOMS THAT SHOULD BE REPORTED IMMEDIATELY:  *FEVER GREATER THAN 100.5 F  *CHILLS WITH OR WITHOUT FEVER  NAUSEA AND VOMITING THAT IS NOT CONTROLLED WITH YOUR NAUSEA MEDICATION  *UNUSUAL SHORTNESS OF BREATH  *UNUSUAL BRUISING OR BLEEDING  TENDERNESS IN MOUTH AND THROAT WITH OR WITHOUT PRESENCE OF ULCERS  *URINARY PROBLEMS  *BOWEL PROBLEMS  UNUSUAL RASH Items with * indicate a potential emergency and should be followed up as soon as possible.  Feel free to call the clinic you have any questions or concerns. The clinic phone number is [phone].  

## 2016-05-20 NOTE — Telephone Encounter (Signed)
GAVE PATIENT AVS REPORT AND APPOINTMENTS FOR December. [facility] WILL CALL RE SCAN.  °

## 2016-05-20 NOTE — Progress Notes (Signed)
Patient having bruising around port site. Per Dr. Alen Blew patient to have labs drawn peripherally and port will be assessed during patient visit with Dr. Alen Blew. Patient aware and verbalized understanding.

## 2016-05-22 ENCOUNTER — Ambulatory Visit (HOSPITAL_BASED_OUTPATIENT_CLINIC_OR_DEPARTMENT_OTHER): Payer: Medicare Other

## 2016-05-22 VITALS — BP 156/96 | Temp 98.0°F

## 2016-05-22 DIAGNOSIS — C180 Malignant neoplasm of cecum: Secondary | ICD-10-CM | POA: Diagnosis not present

## 2016-05-22 DIAGNOSIS — Z452 Encounter for adjustment and management of vascular access device: Secondary | ICD-10-CM | POA: Diagnosis not present

## 2016-05-22 DIAGNOSIS — C189 Malignant neoplasm of colon, unspecified: Secondary | ICD-10-CM

## 2016-05-22 DIAGNOSIS — K769 Liver disease, unspecified: Secondary | ICD-10-CM

## 2016-05-22 DIAGNOSIS — K6389 Other specified diseases of intestine: Secondary | ICD-10-CM

## 2016-05-22 MED ORDER — HEPARIN SOD (PORK) LOCK FLUSH 100 UNIT/ML IV SOLN
500.0000 [IU] | Freq: Once | INTRAVENOUS | Status: AC | PRN
  Administered 2016-05-22: 500 [IU]
  Filled 2016-05-22: qty 5

## 2016-05-22 MED ORDER — SODIUM CHLORIDE 0.9% FLUSH
10.0000 mL | INTRAVENOUS | Status: DC | PRN
  Administered 2016-05-22: 10 mL
  Filled 2016-05-22: qty 10

## 2016-05-22 NOTE — Progress Notes (Signed)
Patients blood pressure elevated. Patient denies chest pain, dizziness, or vision problems. Dr. Alen Blew notified. Patient discharged per Dr. Alen Blew. Patient to notify clinic with any further concerns. Patient verbalized understanding to above mentioned plan.

## 2016-05-22 NOTE — Patient Instructions (Signed)

## 2016-05-29 DIAGNOSIS — R05 Cough: Secondary | ICD-10-CM | POA: Diagnosis not present

## 2016-05-29 DIAGNOSIS — Z23 Encounter for immunization: Secondary | ICD-10-CM | POA: Diagnosis not present

## 2016-05-29 DIAGNOSIS — J069 Acute upper respiratory infection, unspecified: Secondary | ICD-10-CM | POA: Diagnosis not present

## 2016-06-03 ENCOUNTER — Ambulatory Visit (HOSPITAL_BASED_OUTPATIENT_CLINIC_OR_DEPARTMENT_OTHER): Payer: Medicare Other

## 2016-06-03 ENCOUNTER — Other Ambulatory Visit (HOSPITAL_BASED_OUTPATIENT_CLINIC_OR_DEPARTMENT_OTHER): Payer: Medicare Other

## 2016-06-03 ENCOUNTER — Ambulatory Visit: Payer: Medicare Other

## 2016-06-03 ENCOUNTER — Ambulatory Visit (HOSPITAL_BASED_OUTPATIENT_CLINIC_OR_DEPARTMENT_OTHER): Payer: Medicare Other | Admitting: Oncology

## 2016-06-03 VITALS — BP 159/87 | HR 78 | Temp 97.8°F | Resp 18 | Ht 64.0 in | Wt 165.6 lb

## 2016-06-03 DIAGNOSIS — C180 Malignant neoplasm of cecum: Secondary | ICD-10-CM

## 2016-06-03 DIAGNOSIS — Z5112 Encounter for antineoplastic immunotherapy: Secondary | ICD-10-CM | POA: Diagnosis present

## 2016-06-03 DIAGNOSIS — G609 Hereditary and idiopathic neuropathy, unspecified: Secondary | ICD-10-CM | POA: Diagnosis not present

## 2016-06-03 DIAGNOSIS — Z5111 Encounter for antineoplastic chemotherapy: Secondary | ICD-10-CM | POA: Diagnosis not present

## 2016-06-03 DIAGNOSIS — Z79899 Other long term (current) drug therapy: Secondary | ICD-10-CM

## 2016-06-03 DIAGNOSIS — C189 Malignant neoplasm of colon, unspecified: Secondary | ICD-10-CM

## 2016-06-03 DIAGNOSIS — K769 Liver disease, unspecified: Secondary | ICD-10-CM

## 2016-06-03 DIAGNOSIS — C787 Secondary malignant neoplasm of liver and intrahepatic bile duct: Secondary | ICD-10-CM

## 2016-06-03 DIAGNOSIS — K6389 Other specified diseases of intestine: Secondary | ICD-10-CM

## 2016-06-03 LAB — CBC WITH DIFFERENTIAL/PLATELET
BASO%: 0.8 % (ref 0.0–2.0)
BASOS ABS: 0 10*3/uL (ref 0.0–0.1)
EOS ABS: 0.1 10*3/uL (ref 0.0–0.5)
EOS%: 3.3 % (ref 0.0–7.0)
HEMATOCRIT: 38.9 % (ref 34.8–46.6)
HEMOGLOBIN: 12.6 g/dL (ref 11.6–15.9)
LYMPH#: 1.3 10*3/uL (ref 0.9–3.3)
LYMPH%: 37.7 % (ref 14.0–49.7)
MCH: 31.9 pg (ref 25.1–34.0)
MCHC: 32.4 g/dL (ref 31.5–36.0)
MCV: 98.6 fL (ref 79.5–101.0)
MONO#: 0.5 10*3/uL (ref 0.1–0.9)
MONO%: 14.3 % — ABNORMAL HIGH (ref 0.0–14.0)
NEUT#: 1.5 10*3/uL (ref 1.5–6.5)
NEUT%: 43.9 % (ref 38.4–76.8)
Platelets: 158 10*3/uL (ref 145–400)
RBC: 3.94 10*6/uL (ref 3.70–5.45)
RDW: 15.5 % — ABNORMAL HIGH (ref 11.2–14.5)
WBC: 3.4 10*3/uL — ABNORMAL LOW (ref 3.9–10.3)

## 2016-06-03 LAB — COMPREHENSIVE METABOLIC PANEL
ALBUMIN: 3.3 g/dL — AB (ref 3.5–5.0)
ALK PHOS: 86 U/L (ref 40–150)
ALT: 16 U/L (ref 0–55)
ANION GAP: 8 meq/L (ref 3–11)
AST: 19 U/L (ref 5–34)
BUN: 17 mg/dL (ref 7.0–26.0)
CALCIUM: 9.5 mg/dL (ref 8.4–10.4)
CHLORIDE: 106 meq/L (ref 98–109)
CO2: 24 mEq/L (ref 22–29)
Creatinine: 1 mg/dL (ref 0.6–1.1)
EGFR: 67 mL/min/{1.73_m2} — AB (ref >90)
Glucose: 83 mg/dl (ref 70–140)
POTASSIUM: 4.3 meq/L (ref 3.5–5.1)
Sodium: 139 mEq/L (ref 136–145)
Total Bilirubin: 0.67 mg/dL (ref 0.20–1.20)
Total Protein: 7.6 g/dL (ref 6.4–8.3)

## 2016-06-03 LAB — CEA (IN HOUSE-CHCC): CEA (CHCC-IN HOUSE): 7.15 ng/mL — AB (ref 0.00–5.00)

## 2016-06-03 LAB — UA PROTEIN, DIPSTICK - CHCC: PROTEIN: NEGATIVE mg/dL

## 2016-06-03 MED ORDER — LEUCOVORIN CALCIUM INJECTION 350 MG
400.0000 mg/m2 | Freq: Once | INTRAVENOUS | Status: AC
  Administered 2016-06-03: 752 mg via INTRAVENOUS
  Filled 2016-06-03: qty 37.6

## 2016-06-03 MED ORDER — SODIUM CHLORIDE 0.9 % IV SOLN
Freq: Once | INTRAVENOUS | Status: AC
  Administered 2016-06-03: 11:00:00 via INTRAVENOUS

## 2016-06-03 MED ORDER — FLUOROURACIL CHEMO INJECTION 2.5 GM/50ML
400.0000 mg/m2 | Freq: Once | INTRAVENOUS | Status: AC
  Administered 2016-06-03: 750 mg via INTRAVENOUS
  Filled 2016-06-03: qty 15

## 2016-06-03 MED ORDER — SODIUM CHLORIDE 0.9 % IV SOLN
5.0000 mg/kg | Freq: Once | INTRAVENOUS | Status: AC
  Administered 2016-06-03: 400 mg via INTRAVENOUS
  Filled 2016-06-03: qty 16

## 2016-06-03 MED ORDER — PALONOSETRON HCL INJECTION 0.25 MG/5ML
0.2500 mg | Freq: Once | INTRAVENOUS | Status: AC
  Administered 2016-06-03: 0.25 mg via INTRAVENOUS

## 2016-06-03 MED ORDER — DEXAMETHASONE SODIUM PHOSPHATE 10 MG/ML IJ SOLN
INTRAMUSCULAR | Status: AC
  Filled 2016-06-03: qty 1

## 2016-06-03 MED ORDER — SODIUM CHLORIDE 0.9 % IV SOLN
2400.0000 mg/m2 | INTRAVENOUS | Status: DC
  Administered 2016-06-03: 4500 mg via INTRAVENOUS
  Filled 2016-06-03: qty 90

## 2016-06-03 MED ORDER — DEXAMETHASONE SODIUM PHOSPHATE 10 MG/ML IJ SOLN
10.0000 mg | Freq: Once | INTRAMUSCULAR | Status: AC
  Administered 2016-06-03: 10 mg via INTRAVENOUS

## 2016-06-03 MED ORDER — PALONOSETRON HCL INJECTION 0.25 MG/5ML
INTRAVENOUS | Status: AC
  Filled 2016-06-03: qty 5

## 2016-06-03 NOTE — Patient Instructions (Signed)
[facility] Discharge Instructions for Patients Receiving Chemotherapy  Today you received the following chemotherapy agents:  Leucovorin, 5FU and Avastin  To help prevent nausea and vomiting after your treatment, we encourage you to take your nausea medication as ordered per MD.   If you develop nausea and vomiting that is not controlled by your nausea medication, call the clinic.   BELOW ARE SYMPTOMS THAT SHOULD BE REPORTED IMMEDIATELY:  *FEVER GREATER THAN 100.5 F  *CHILLS WITH OR WITHOUT FEVER  NAUSEA AND VOMITING THAT IS NOT CONTROLLED WITH YOUR NAUSEA MEDICATION  *UNUSUAL SHORTNESS OF BREATH  *UNUSUAL BRUISING OR BLEEDING  TENDERNESS IN MOUTH AND THROAT WITH OR WITHOUT PRESENCE OF ULCERS  *URINARY PROBLEMS  *BOWEL PROBLEMS  UNUSUAL RASH Items with * indicate a potential emergency and should be followed up as soon as possible.  Feel free to call the clinic you have any questions or concerns. The clinic phone number is [phone].  Please show the [place] at check-in to the Emergency Department and triage nurse.

## 2016-06-03 NOTE — Progress Notes (Signed)
Hematology and Oncology Follow Up Visit  Margaret Elliott [id number] 1950/07/03    06/03/16   Principle Diagnosis: 65 year old woman diagnosed with colon cancer in June 2014. She had presented with an abdominal pain and found to have a cecal mass resulting in obstruction and appendicitis. She had Likely has stage IV disease with a liver mass.    Prior Therapy:  She is status post laparoscopic laparotomy, evacuation of a pelvic abscess and right hemicolectomy with ileocolonic anastomosis done on December 09, 2012. The pathology showed an invasive, well- differentiated colorectal adenocarcinoma. Tumor invades through the muscularis propria, with 2/18 lymph nodes involved, with the pathological staging T3 N1b disease. Her tumor was found to be microsatellite stable. KRAS wild type.   She is also status post Port-A-Cath insertion that was done on 01/05/2013.  FOLFOX chemotherapy started 01/18/2013. Avastin to be added with cycle 2 on 02/01/2013. She is S/P 12 cycles completed in 05/2013.   She is a status post microwave ablation of metastatic lesion in the posterior segment of the right lobe of the liver completed on 09/28/2014 and repeated on 11/30/2014.  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   Current therapy: FOLFIRI and a Avastin salvage therapy started on 12/10/2015. She has received 5-FU, leucovorin and a Avastin only starting with cycle 10. She is here for cycle 13 of therapy.  Interim History:  Margaret Elliott presents today for a followup visit. Since her last visit, she reports no major changes in her health. She reports she had excellent 2 weeks since the last visit and remains reasonably active. She continues to work and exercise more regularly at this time.   She tolerated the last chemotherapy without any delayed complications. She reports less fatigue, tiredness and more energy. She remains reasonably active and  continues to perform work-related duties. She denied any abdominal pain or early satiety. She denied any diarrhea, hematochezia or change in her bowel habits.  She is not report any headaches, blurry vision, syncope or seizures. She does not report any fevers, chills or sweats. She does not report any chest pain, palpitation or orthopnea. She does not report any wheezing, hemoptysis or hematemesis. She does not report any frequency urgency or hesitancy. Does not report any lymphadenopathy or petechiae. Rest of her review of systems unremarkable.   Medications: I have reviewed the patient's current medications.  Current Outpatient Prescriptions  Medication Sig Dispense Refill  . acetaminophen (TYLENOL) 500 MG tablet Take 500 mg by mouth every 6 (six) hours as needed for pain.      . diphenhydrAMINE (BENADRYL) 25 mg capsule Take 25-50 mg by mouth daily as needed for itching. For allergic reaction      . lidocaine-prilocaine (EMLA) cream Apply 1 application topically as needed. Apply approx 1/2 tsp to skin over port, prior to chemotherapy treatments  1 kit  3  . Multiple Vitamin (MULTIVITAMIN) tablet Take 1 tablet by mouth daily.      . ondansetron (ZOFRAN) 8 MG tablet Take 1 tablet (8 mg total) by mouth every 8 (eight) hours as needed for nausea.  30 tablet  1   No current facility-administered medications for this visit.     Allergies: No Known Allergies  Past Medical History, Surgical history, Social history, and Family History were reviewed and updated.    Physical Exam:  Blood pressure (!) 159/87, pulse 78, temperature 97.8 F (36.6 C), temperature source Oral, resp. rate 18, height  $'5\' 4"'h$  (1.626 m), weight 165 lb 9.6 oz (75.1 kg), SpO2 100 %. ECOG: 0 General appearance: Alert, awake woman without distress. Well-appearing. Head: Normocephalic, without obvious abnormality. No oral thrush or ulcers. Neck: no adenopathy. Thyroid masses. Lymph nodes: Cervical, supraclavicular, and  axillary nodes normal. Heart:regular rate and rhythm, S1, S2 normal, no murmur, rubs or gallops. Lung:chest clear, no wheezing, rales, normal symmetric air entry no dullness to percussion. Chest wall examination: Revealed Port-A-Cath site appeared clean, dry without erythema or induration. Ecchymosis improved. Abdomen: soft, non-tender, without masses or nodularity noted. No rebound or guarding. EXT:no erythema, induration, or nodules Neurological: No new neurological deficits noted.  CBC    Component Value Date/Time   WBC 3.4 (L) 06/03/2016 0938   WBC 4.0 11/20/2015 0735   RBC 3.94 06/03/2016 0938   RBC 3.95 11/20/2015 0735   HGB 12.6 06/03/2016 0938   HCT 38.9 06/03/2016 0938   PLT 158 06/03/2016 0938   MCV 98.6 06/03/2016 0938   MCH 31.9 06/03/2016 0938   MCH 30.6 11/20/2015 0735   MCHC 32.4 06/03/2016 0938   MCHC 34.1 11/20/2015 0735   RDW 15.5 (H) 06/03/2016 0938   LYMPHSABS 1.3 06/03/2016 0938   MONOABS 0.5 06/03/2016 0938   EOSABS 0.1 06/03/2016 0938   BASOSABS 0.0 06/03/2016 0938    Results for Margaret Elliott (MRN [id number]) as of 06/03/2016 10:18  Ref. Range 04/22/2016 08:37 05/06/2016 08:36 05/20/2016 09:27  CEA ([facility]) Latest Ref Range: 0.00 - 5.00 ng/mL 18.24 (H) 10.62 (H) 8.00 (H)     Impression and Plan:  65 year old woman with the following issues:   1. Advanced Colon cancer. She presented with a large right cecal mass causing obstruction and abscess and appendicitis. After surgical resection she received 12 cycles of chemotherapy with PET/CT scan on 09/12/2013 showed no residual disease actually in the liver that is hypermetabolic.  She is status post liver directed therapy for metastatic disease and to the liver stream and was given in June 2016.  Her PET/CT scan on 11/08/2015 showed progressive omental and peritoneal metastasis. Biopsy proven to be adenocarcinoma of the colon that is KRAS wild-type but NRAS mutated.  She is on salvage FOLFIRI  and a Avastin that is well-tolerated.   CT scan obtain on 04/07/2016 showed positive response to therapy with regression of her omental disease and ovarian disease.  She is currently receiving 5-FU, leucovorin and Avastin and have tolerated it well. The plan is to continue with these maintenance regimen and repeat imaging studies on 06/16/2016. A treatment holiday versus continuing maintenance therapy would be discussed.  2. Intravenous access. Port-A-Cath site appeared normal with mild ecchymosis noted. This is improved at this time.  3. Neuropathy: Appears to have worsened at this time on chemotherapy. Discontinuation of CPT-11 helped her symptoms.  4. Antiemetics: Compazine is effective at this time.  5. Neutropenia: Neulasta will be discontinued at this time.  6. Followup: Will be in 2 weeks for cycle 14 of chemotherapy.  [facility] MD 06/03/16

## 2016-06-05 ENCOUNTER — Ambulatory Visit (HOSPITAL_BASED_OUTPATIENT_CLINIC_OR_DEPARTMENT_OTHER): Payer: Medicare Other

## 2016-06-05 DIAGNOSIS — K769 Liver disease, unspecified: Secondary | ICD-10-CM

## 2016-06-05 DIAGNOSIS — Z452 Encounter for adjustment and management of vascular access device: Secondary | ICD-10-CM

## 2016-06-05 DIAGNOSIS — K6389 Other specified diseases of intestine: Secondary | ICD-10-CM

## 2016-06-05 DIAGNOSIS — C189 Malignant neoplasm of colon, unspecified: Secondary | ICD-10-CM

## 2016-06-05 DIAGNOSIS — C180 Malignant neoplasm of cecum: Secondary | ICD-10-CM | POA: Diagnosis not present

## 2016-06-05 MED ORDER — SODIUM CHLORIDE 0.9% FLUSH
10.0000 mL | INTRAVENOUS | Status: DC | PRN
  Administered 2016-06-05: 10 mL
  Filled 2016-06-05: qty 10

## 2016-06-05 MED ORDER — HEPARIN SOD (PORK) LOCK FLUSH 100 UNIT/ML IV SOLN
500.0000 [IU] | Freq: Once | INTRAVENOUS | Status: AC | PRN
  Administered 2016-06-05: 500 [IU]
  Filled 2016-06-05: qty 5

## 2016-06-05 NOTE — Patient Instructions (Signed)

## 2016-06-11 ENCOUNTER — Encounter: Payer: Self-pay | Admitting: *Deleted

## 2016-06-11 NOTE — Progress Notes (Signed)
QI encounter 

## 2016-06-16 ENCOUNTER — Ambulatory Visit (HOSPITAL_COMMUNITY)
Admission: RE | Admit: 2016-06-16 | Discharge: 2016-06-16 | Disposition: A | Discharge status: Home / Self Care | Payer: Medicare Other | Source: Ambulatory Visit | Attending: Oncology | Admitting: Oncology

## 2016-06-16 DIAGNOSIS — I700 Atherosclerosis of aorta: Secondary | ICD-10-CM | POA: Diagnosis not present

## 2016-06-16 DIAGNOSIS — K5730 Diverticulosis of large intestine without perforation or abscess without bleeding: Secondary | ICD-10-CM | POA: Diagnosis not present

## 2016-06-16 DIAGNOSIS — M48061 Spinal stenosis, lumbar region without neurogenic claudication: Secondary | ICD-10-CM | POA: Insufficient documentation

## 2016-06-16 DIAGNOSIS — M47896 Other spondylosis, lumbar region: Secondary | ICD-10-CM | POA: Insufficient documentation

## 2016-06-16 DIAGNOSIS — M5136 Other intervertebral disc degeneration, lumbar region: Secondary | ICD-10-CM | POA: Insufficient documentation

## 2016-06-16 DIAGNOSIS — M47897 Other spondylosis, lumbosacral region: Secondary | ICD-10-CM | POA: Diagnosis not present

## 2016-06-16 DIAGNOSIS — C189 Malignant neoplasm of colon, unspecified: Secondary | ICD-10-CM

## 2016-06-16 DIAGNOSIS — M5137 Other intervertebral disc degeneration, lumbosacral region: Secondary | ICD-10-CM | POA: Insufficient documentation

## 2016-06-16 DIAGNOSIS — R918 Other nonspecific abnormal finding of lung field: Secondary | ICD-10-CM | POA: Insufficient documentation

## 2016-06-16 DIAGNOSIS — M47894 Other spondylosis, thoracic region: Secondary | ICD-10-CM | POA: Diagnosis not present

## 2016-06-16 DIAGNOSIS — C787 Secondary malignant neoplasm of liver and intrahepatic bile duct: Secondary | ICD-10-CM | POA: Diagnosis not present

## 2016-06-16 MED ORDER — HEPARIN SOD (PORK) LOCK FLUSH 100 UNIT/ML IV SOLN
INTRAVENOUS | Status: AC
  Filled 2016-06-16: qty 5

## 2016-06-16 MED ORDER — IOPAMIDOL (ISOVUE-300) INJECTION 61%
INTRAVENOUS | Status: AC
  Administered 2016-06-16: 100 mL
  Filled 2016-06-16: qty 100

## 2016-06-17 ENCOUNTER — Telehealth: Payer: Self-pay | Admitting: *Deleted

## 2016-06-17 ENCOUNTER — Ambulatory Visit (HOSPITAL_BASED_OUTPATIENT_CLINIC_OR_DEPARTMENT_OTHER): Payer: Medicare Other | Admitting: Oncology

## 2016-06-17 ENCOUNTER — Ambulatory Visit: Payer: Medicare Other

## 2016-06-17 ENCOUNTER — Encounter: Payer: Self-pay | Admitting: *Deleted

## 2016-06-17 ENCOUNTER — Telehealth: Payer: Self-pay | Admitting: Oncology

## 2016-06-17 ENCOUNTER — Ambulatory Visit (HOSPITAL_BASED_OUTPATIENT_CLINIC_OR_DEPARTMENT_OTHER): Payer: Medicare Other

## 2016-06-17 ENCOUNTER — Other Ambulatory Visit (HOSPITAL_BASED_OUTPATIENT_CLINIC_OR_DEPARTMENT_OTHER): Payer: Medicare Other

## 2016-06-17 VITALS — BP 163/88 | HR 85 | Temp 98.1°F | Resp 16 | Ht 64.0 in | Wt 167.4 lb

## 2016-06-17 DIAGNOSIS — C180 Malignant neoplasm of cecum: Secondary | ICD-10-CM | POA: Diagnosis not present

## 2016-06-17 DIAGNOSIS — C189 Malignant neoplasm of colon, unspecified: Secondary | ICD-10-CM

## 2016-06-17 DIAGNOSIS — K769 Liver disease, unspecified: Secondary | ICD-10-CM

## 2016-06-17 DIAGNOSIS — Z5112 Encounter for antineoplastic immunotherapy: Secondary | ICD-10-CM

## 2016-06-17 DIAGNOSIS — C787 Secondary malignant neoplasm of liver and intrahepatic bile duct: Secondary | ICD-10-CM

## 2016-06-17 DIAGNOSIS — K6389 Other specified diseases of intestine: Secondary | ICD-10-CM

## 2016-06-17 DIAGNOSIS — G629 Polyneuropathy, unspecified: Secondary | ICD-10-CM

## 2016-06-17 LAB — CBC WITH DIFFERENTIAL/PLATELET
BASO%: 0.5 % (ref 0.0–2.0)
Basophils Absolute: 0 10*3/uL (ref 0.0–0.1)
EOS%: 2.9 % (ref 0.0–7.0)
Eosinophils Absolute: 0.1 10*3/uL (ref 0.0–0.5)
HCT: 36.6 % (ref 34.8–46.6)
HGB: 12 g/dL (ref 11.6–15.9)
LYMPH#: 1.4 10*3/uL (ref 0.9–3.3)
LYMPH%: 35 % (ref 14.0–49.7)
MCH: 31.6 pg (ref 25.1–34.0)
MCHC: 32.8 g/dL (ref 31.5–36.0)
MCV: 96.3 fL (ref 79.5–101.0)
MONO#: 0.4 10*3/uL (ref 0.1–0.9)
MONO%: 8.8 % (ref 0.0–14.0)
NEUT%: 52.8 % (ref 38.4–76.8)
NEUTROS ABS: 2.2 10*3/uL (ref 1.5–6.5)
Platelets: 150 10*3/uL (ref 145–400)
RBC: 3.8 10*6/uL (ref 3.70–5.45)
RDW: 14.7 % — AB (ref 11.2–14.5)
WBC: 4.1 10*3/uL (ref 3.9–10.3)

## 2016-06-17 LAB — CEA (IN HOUSE-CHCC): CEA (CHCC-In House): 5.1 ng/mL — ABNORMAL HIGH (ref 0.00–5.00)

## 2016-06-17 LAB — COMPREHENSIVE METABOLIC PANEL
ALT: 10 U/L (ref 0–55)
AST: 15 U/L (ref 5–34)
Albumin: 3.3 g/dL — ABNORMAL LOW (ref 3.5–5.0)
Alkaline Phosphatase: 80 U/L (ref 40–150)
Anion Gap: 8 mEq/L (ref 3–11)
BUN: 14.3 mg/dL (ref 7.0–26.0)
CHLORIDE: 109 meq/L (ref 98–109)
CO2: 23 meq/L (ref 22–29)
CREATININE: 0.9 mg/dL (ref 0.6–1.1)
Calcium: 9.1 mg/dL (ref 8.4–10.4)
EGFR: 76 mL/min/{1.73_m2} — ABNORMAL LOW (ref >90)
GLUCOSE: 100 mg/dL (ref 70–140)
Potassium: 3.9 mEq/L (ref 3.5–5.1)
SODIUM: 139 meq/L (ref 136–145)
Total Bilirubin: 0.55 mg/dL (ref 0.20–1.20)
Total Protein: 7 g/dL (ref 6.4–8.3)

## 2016-06-17 MED ORDER — SODIUM CHLORIDE 0.9 % IV SOLN
2400.0000 mg/m2 | INTRAVENOUS | Status: DC
  Administered 2016-06-17: 4500 mg via INTRAVENOUS
  Filled 2016-06-17: qty 90

## 2016-06-17 MED ORDER — FLUOROURACIL CHEMO INJECTION 2.5 GM/50ML
400.0000 mg/m2 | Freq: Once | INTRAVENOUS | Status: AC
  Administered 2016-06-17: 750 mg via INTRAVENOUS
  Filled 2016-06-17: qty 15

## 2016-06-17 MED ORDER — DEXAMETHASONE SODIUM PHOSPHATE 10 MG/ML IJ SOLN
INTRAMUSCULAR | Status: AC
  Filled 2016-06-17: qty 1

## 2016-06-17 MED ORDER — SODIUM CHLORIDE 0.9 % IV SOLN
5.0000 mg/kg | Freq: Once | INTRAVENOUS | Status: AC
  Administered 2016-06-17: 400 mg via INTRAVENOUS
  Filled 2016-06-17: qty 16

## 2016-06-17 MED ORDER — SODIUM CHLORIDE 0.9 % IV SOLN
Freq: Once | INTRAVENOUS | Status: AC
  Administered 2016-06-17: 10:00:00 via INTRAVENOUS

## 2016-06-17 MED ORDER — PALONOSETRON HCL INJECTION 0.25 MG/5ML
0.2500 mg | Freq: Once | INTRAVENOUS | Status: AC
  Administered 2016-06-17: 0.25 mg via INTRAVENOUS

## 2016-06-17 MED ORDER — DEXAMETHASONE SODIUM PHOSPHATE 10 MG/ML IJ SOLN
10.0000 mg | Freq: Once | INTRAMUSCULAR | Status: AC
  Administered 2016-06-17: 10 mg via INTRAVENOUS

## 2016-06-17 MED ORDER — PALONOSETRON HCL INJECTION 0.25 MG/5ML
INTRAVENOUS | Status: AC
  Filled 2016-06-17: qty 5

## 2016-06-17 MED ORDER — DEXTROSE 5 % IV SOLN
400.0000 mg/m2 | Freq: Once | INTRAVENOUS | Status: AC
  Administered 2016-06-17: 752 mg via INTRAVENOUS
  Filled 2016-06-17: qty 37.6

## 2016-06-17 NOTE — Progress Notes (Signed)
Per Dr. Alen Blew, I faxed a 5 page referral to [facility] to surgical oncology for [facility] (intra-peritoneal hypothermic chemotherapy). Office # is [phone] and fax # is [phone]. Dr. Alen Blew would like patient to see Dr. Towanda Malkin. [facility] will call [place] back if patient is a candidate.

## 2016-06-17 NOTE — Telephone Encounter (Signed)
Per LOS I have scheduled appt and notified the scheduler 

## 2016-06-17 NOTE — Patient Instructions (Signed)
[facility] Discharge Instructions for Patients Receiving Chemotherapy  Today you received the following chemotherapy agents Avastin, Leucovorin, 5 FU  To help prevent nausea and vomiting after your treatment, we encourage you to take your nausea medication as needed   If you develop nausea and vomiting that is not controlled by your nausea medication, call the clinic.   BELOW ARE SYMPTOMS THAT SHOULD BE REPORTED IMMEDIATELY:  *FEVER GREATER THAN 100.5 F  *CHILLS WITH OR WITHOUT FEVER  NAUSEA AND VOMITING THAT IS NOT CONTROLLED WITH YOUR NAUSEA MEDICATION  *UNUSUAL SHORTNESS OF BREATH  *UNUSUAL BRUISING OR BLEEDING  TENDERNESS IN MOUTH AND THROAT WITH OR WITHOUT PRESENCE OF ULCERS  *URINARY PROBLEMS  *BOWEL PROBLEMS  UNUSUAL RASH Items with * indicate a potential emergency and should be followed up as soon as possible.  Feel free to call the clinic you have any questions or concerns. The clinic phone number is [phone].  Please show the [place] at check-in to the Emergency Department and triage nurse.

## 2016-06-17 NOTE — Telephone Encounter (Signed)
Message sent to chemo scheduler to be added per 06/17/16 los. Appointments scheduled per 06/17/16 los. Patient was given a copy of the AVS report and appointment schedule, per 06/17/16 los.

## 2016-06-17 NOTE — Progress Notes (Signed)
Hematology and Oncology Follow Up Visit  Margaret Elliott [id number] August 29, 1950    06/17/16   Principle Diagnosis: 65 year old woman diagnosed with colon cancer in June 2014. She had presented with an abdominal pain and found to have a cecal mass resulting in obstruction and appendicitis. She had Likely has stage IV disease with a liver mass.    Prior Therapy:  She is status post laparoscopic laparotomy, evacuation of a pelvic abscess and right hemicolectomy with ileocolonic anastomosis done on December 09, 2012. The pathology showed an invasive, well- differentiated colorectal adenocarcinoma. Tumor invades through the muscularis propria, with 2/18 lymph nodes involved, with the pathological staging T3 N1b disease. Her tumor was found to be microsatellite stable. KRAS wild type.   She is also status post Port-A-Cath insertion that was done on 01/05/2013.  FOLFOX chemotherapy started 01/18/2013. Avastin to be added with cycle 2 on 02/01/2013. She is S/P 12 cycles completed in 05/2013.   She is a status post microwave ablation of metastatic lesion in the posterior segment of the right lobe of the liver completed on 09/28/2014 and repeated on 11/30/2014.  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   Current therapy: FOLFIRI and a Avastin salvage therapy started on 12/10/2015. She has received 5-FU, leucovorin and a Avastin only starting with cycle 10. She is here for cycle 14 of therapy.  Interim History:  Margaret Elliott presents today for a followup visit. Since her last visit, she continues to tolerate maintenance chemotherapy without complications. She reports no residual side effects associated with this therapy. She denied any nausea, vomiting or fevers. She still have residual neuropathy which is unchanged. She continues to show excellent performance status and quality of life. She denied any abdominal pain or distention.  She is  not report any headaches, blurry vision, syncope or seizures. She does not report any fevers, chills or sweats. She does not report any chest pain, palpitation or orthopnea. She does not report any wheezing, hemoptysis or hematemesis. She does not report any frequency urgency or hesitancy. Does not report any lymphadenopathy or petechiae. Rest of her review of systems unremarkable.   Medications: I have reviewed the patient's current medications.  Current Outpatient Prescriptions  Medication Sig Dispense Refill  . acetaminophen (TYLENOL) 500 MG tablet Take 500 mg by mouth every 6 (six) hours as needed for pain.      . diphenhydrAMINE (BENADRYL) 25 mg capsule Take 25-50 mg by mouth daily as needed for itching. For allergic reaction      . lidocaine-prilocaine (EMLA) cream Apply 1 application topically as needed. Apply approx 1/2 tsp to skin over port, prior to chemotherapy treatments  1 kit  3  . Multiple Vitamin (MULTIVITAMIN) tablet Take 1 tablet by mouth daily.      . ondansetron (ZOFRAN) 8 MG tablet Take 1 tablet (8 mg total) by mouth every 8 (eight) hours as needed for nausea.  30 tablet  1   No current facility-administered medications for this visit.     Allergies: No Known Allergies  Past Medical History, Surgical history, Social history, and Family History were reviewed and updated.    Physical Exam:  Blood pressure (!) 163/88, pulse 85, temperature 98.1 F (36.7 C), temperature source Oral, resp. rate 16, height '5\' 4"'$  (1.626 m), weight 167 lb 6.4 oz (75.9 kg), SpO2 100 %. ECOG: 0 General appearance: Well-appearing woman without distress. Head: Normocephalic, without obvious abnormality. No oral ulcers or  lesions. Neck: no adenopathy. Thyroid masses. Lymph nodes: Cervical, supraclavicular, and axillary nodes normal. Heart:regular rate and rhythm, S1, S2 normal, no murmur, rubs or gallops. Lung:chest clear, no wheezing, rales, normal symmetric air entry no dullness to  percussion. Chest wall examination: Revealed Port-A-Cath site appeared clean, dry without erythema or induration.  Abdomen: soft, non-tender, without masses or nodularity noted. No rebound or guarding. EXT:no erythema, induration, or nodules Neurological: No new neurological deficits noted.  CBC    Component Value Date/Time   WBC 4.1 06/17/2016 0832   WBC 4.0 11/20/2015 0735   RBC 3.80 06/17/2016 0832   RBC 3.95 11/20/2015 0735   HGB 12.0 06/17/2016 0832   HCT 36.6 06/17/2016 0832   PLT 150 06/17/2016 0832   MCV 96.3 06/17/2016 0832   MCH 31.6 06/17/2016 0832   MCH 30.6 11/20/2015 0735   MCHC 32.8 06/17/2016 0832   MCHC 34.1 11/20/2015 0735   RDW 14.7 (H) 06/17/2016 0832   LYMPHSABS 1.4 06/17/2016 0832   MONOABS 0.4 06/17/2016 0832   EOSABS 0.1 06/17/2016 0832   BASOSABS 0.0 06/17/2016 0832    Results for Margaret Elliott, Margaret Elliott (MRN [id number]) as of 06/17/2016 09:12  Ref. Range 01/15/2016 08:20 01/29/2016 08:35 02/12/2016 08:31 02/26/2016 08:11 03/11/2016 07:59 03/25/2016 08:20 04/08/2016 08:08 04/22/2016 08:37 05/06/2016 08:36 05/20/2016 09:27 06/03/2016 09:38  CEA ([facility]) Latest Ref Range: 0.00 - 5.00 ng/mL  34.56 (H) 41.16 (H) 38.42 (H) 45.19 (H) 32.02 (H) 24.25 (H) 18.24 (H) 10.62 (H) 8.00 (H) 7.15 (H)    EXAM: CT CHEST, ABDOMEN, AND PELVIS WITH CONTRAST  TECHNIQUE: Multidetector CT imaging of the chest, abdomen and pelvis was performed following the standard protocol during bolus administration of intravenous contrast.  CONTRAST:  169m ISOVUE-300 IOPAMIDOL (ISOVUE-300) INJECTION 61%  COMPARISON:  Multiple exams, including 04/07/2016  FINDINGS: CT CHEST FINDINGS  Cardiovascular: Mild atherosclerotic calcification of the aortic arch.  Mediastinum/Nodes: Stable speckled density along the thymic tissue. No adenopathy in the chest.  Lungs/Pleura: 2 by 3 mm left upper lobe calcified granuloma, image 23/4. Right upper lobe 3 by 4 mm subpleural nodule anteriorly  on image 67/4 common no change back through 03/13/2013, considered benign.  Musculoskeletal: Thoracic spondylosis.  CT ABDOMEN PELVIS FINDINGS  Hepatobiliary: 5.2 by 2.0 cm stable hypodense lesion posteriorly in the right hepatic lobe with some enhancement along its margins. No additional significant hepatic lesion. Contracted gallbladder.  Pancreas: No significant abnormality.  Spleen: Unremarkable  Adrenals/Urinary Tract: Adrenal glands normal. Right kidney upper pole cyst. No discrete urinary tract calculi. Several additional tiny hypodense renal lesions are likely cysts but technically too small to characterize.  Stomach/Bowel: Scattered colonic diverticula.  Vascular/Lymphatic: Unremarkable  Reproductive: Left adnexal cystic lesion 3.6 by 2.8 cm, formerly 4.5 by 5.3 cm. Uterus absent. Mildly prominent right ovary 4.1 by 2.1 cm on image 95/2, formerly 4.1 by 2.4 cm. Faint internal hypodensity.  Other: Right lower quadrant omental tumor measures 1.1 cm in thickness on image 78/2, previously 1.4 cm. Other foci of omental tumor are again noted. An indistinct positive tumor anterior to the proximal descending colon is noted, fairly similar to prior.  Musculoskeletal: Mild bilateral foraminal stenosis at L4-5 due to disc uncovering and facet arthropathy. Mild bilateral foraminal stenosis at L5-S1 due to disc bulge and facet arthropathy. Grade 1 anterolisthesis at L4-5.  IMPRESSION: 1. Mildly reduced size of the omental metastatic deposits. 2. Stable hypodense ablation defect posteriorly in the right hepatic lobe. The continues to be some mildly irregular peripheral enhancement warranting surveillance.  3. Reduction in size of both ovaries, with the cystic lesion of the left ovary significantly smaller in size. 4. Foraminal impingement at L4-5 at L5-S1 due to spondylosis, degenerative disc disease, and disc uncovering.   Impression and Plan:  65 year old  woman with the following issues:   1. Advanced Colon cancer. She presented with a large right cecal mass causing obstruction and abscess and appendicitis. After surgical resection she received 12 cycles of chemotherapy with PET/CT scan on 09/12/2013 showed no residual disease actually in the liver that is hypermetabolic.  She is status post liver directed therapy for metastatic disease and to the liver stream and was given in June 2016.  Her PET/CT scan on 11/08/2015 showed progressive omental and peritoneal metastasis. Biopsy proven to be adenocarcinoma of the colon that is KRAS wild-type but NRAS mutated.  She is on salvage FOLFIRI and a Avastin that is well-tolerated. CPT-11 was removed for better tolerance after cycle 10.  CT scan obtain on 06/16/2016 was personally reviewed today and continues to show reasonable response to therapy with decrease in the size of her omental metastasis.  Options of therapy were reviewed today including continuing maintenance 5-FU, leucovorin and a Avastin. She is agreeable to continue but likely 2 weeks break and will resume chemotherapy on 07/15/2016.   I also discussed with her the option of surgical resection which include peritoneal resection and intraperitoneal hyperthermic chemotherapy. Otherwise she would be a reasonable candidate for this consideration given her response to systemic chemotherapy. I'll make appropriate referrals to [facility] for an evaluation.    2. Intravenous access. Port-A-Cath site appeared normal. This will be utilized with systemic therapy every 2-4 weeks.  3. Neuropathy: Unchanged at this time.  4. Antiemetics: Compazine is available to the patient and has not been problematic.  5. Neutropenia: Neulasta will be discontinued at this time.  6. Followup: Will be in 4 weeks for cycle 15 of chemotherapy.  [facility] MD 06/17/16

## 2016-06-19 ENCOUNTER — Ambulatory Visit: Payer: Medicare Other

## 2016-06-19 ENCOUNTER — Ambulatory Visit (HOSPITAL_BASED_OUTPATIENT_CLINIC_OR_DEPARTMENT_OTHER): Payer: Medicare Other

## 2016-06-19 VITALS — BP 144/90 | HR 74 | Temp 98.3°F | Resp 18

## 2016-06-19 DIAGNOSIS — C189 Malignant neoplasm of colon, unspecified: Secondary | ICD-10-CM

## 2016-06-19 DIAGNOSIS — Z452 Encounter for adjustment and management of vascular access device: Secondary | ICD-10-CM | POA: Diagnosis not present

## 2016-06-19 DIAGNOSIS — K6389 Other specified diseases of intestine: Secondary | ICD-10-CM

## 2016-06-19 DIAGNOSIS — K769 Liver disease, unspecified: Secondary | ICD-10-CM

## 2016-06-19 MED ORDER — HEPARIN SOD (PORK) LOCK FLUSH 100 UNIT/ML IV SOLN
500.0000 [IU] | Freq: Once | INTRAVENOUS | Status: AC | PRN
  Administered 2016-06-19: 500 [IU]
  Filled 2016-06-19: qty 5

## 2016-06-19 MED ORDER — SODIUM CHLORIDE 0.9% FLUSH
10.0000 mL | INTRAVENOUS | Status: DC | PRN
  Administered 2016-06-19: 10 mL
  Filled 2016-06-19: qty 10

## 2016-06-23 ENCOUNTER — Encounter: Payer: Self-pay | Admitting: Oncology

## 2016-06-24 ENCOUNTER — Encounter: Payer: Self-pay | Admitting: Interventional Radiology

## 2016-06-26 DIAGNOSIS — C786 Secondary malignant neoplasm of retroperitoneum and peritoneum: Secondary | ICD-10-CM | POA: Diagnosis not present

## 2016-07-07 ENCOUNTER — Encounter: Payer: Self-pay | Admitting: *Deleted

## 2016-07-07 ENCOUNTER — Telehealth: Payer: Self-pay | Admitting: *Deleted

## 2016-07-07 NOTE — Telephone Encounter (Signed)
Patient calling to say she re-scheduled her appt at [facility] from 1/12 to 1/26. Therefore she will requests  to cancel her lab,dr fs, and chemotherapy appts here for the 24th and 26th for pump removal.

## 2016-07-07 NOTE — Telephone Encounter (Signed)
That will be fine. 

## 2016-07-15 ENCOUNTER — Ambulatory Visit: Payer: Medicare Other | Admitting: Oncology

## 2016-07-15 ENCOUNTER — Ambulatory Visit: Payer: Medicare Other

## 2016-07-15 ENCOUNTER — Other Ambulatory Visit: Payer: Medicare Other

## 2016-07-17 ENCOUNTER — Ambulatory Visit: Payer: Medicare Other

## 2016-07-17 DIAGNOSIS — C787 Secondary malignant neoplasm of liver and intrahepatic bile duct: Secondary | ICD-10-CM | POA: Diagnosis not present

## 2016-07-17 DIAGNOSIS — Z9221 Personal history of antineoplastic chemotherapy: Secondary | ICD-10-CM | POA: Diagnosis not present

## 2016-07-17 DIAGNOSIS — C786 Secondary malignant neoplasm of retroperitoneum and peritoneum: Secondary | ICD-10-CM | POA: Diagnosis not present

## 2016-07-17 DIAGNOSIS — Z9049 Acquired absence of other specified parts of digestive tract: Secondary | ICD-10-CM | POA: Diagnosis not present

## 2016-07-17 DIAGNOSIS — C19 Malignant neoplasm of rectosigmoid junction: Secondary | ICD-10-CM | POA: Diagnosis not present

## 2016-07-17 DIAGNOSIS — C189 Malignant neoplasm of colon, unspecified: Secondary | ICD-10-CM | POA: Diagnosis not present

## 2016-07-20 ENCOUNTER — Telehealth: Payer: Self-pay | Admitting: Oncology

## 2016-07-20 NOTE — Telephone Encounter (Signed)
Pt called to r/s ov to try ang back on scheduel. Gave pt next available date/time per request

## 2016-07-21 ENCOUNTER — Ambulatory Visit (HOSPITAL_BASED_OUTPATIENT_CLINIC_OR_DEPARTMENT_OTHER): Payer: Medicare Other | Admitting: Oncology

## 2016-07-21 ENCOUNTER — Telehealth: Payer: Self-pay | Admitting: Oncology

## 2016-07-21 VITALS — BP 132/88 | HR 86 | Temp 97.6°F | Resp 18 | Ht 64.0 in | Wt 166.7 lb

## 2016-07-21 DIAGNOSIS — C787 Secondary malignant neoplasm of liver and intrahepatic bile duct: Secondary | ICD-10-CM

## 2016-07-21 DIAGNOSIS — C189 Malignant neoplasm of colon, unspecified: Secondary | ICD-10-CM

## 2016-07-21 DIAGNOSIS — C180 Malignant neoplasm of cecum: Secondary | ICD-10-CM | POA: Diagnosis not present

## 2016-07-21 NOTE — Progress Notes (Signed)
Hematology and Oncology Follow Up Visit  Margaret Elliott [id number] 1951/02/03    07/21/16   Principle Diagnosis: 66 year old woman diagnosed with colon cancer in June 2014. She had presented with an abdominal pain and found to have a cecal mass resulting in obstruction and appendicitis. She had Likely has stage IV disease with a liver mass.    Prior Therapy:  She is status post laparoscopic laparotomy, evacuation of a pelvic abscess and right hemicolectomy with ileocolonic anastomosis done on December 09, 2012. The pathology showed an invasive, well- differentiated colorectal adenocarcinoma. Tumor invades through the muscularis propria, with 2/18 lymph nodes involved, with the pathological staging T3 N1b disease. Her tumor was found to be microsatellite stable. KRAS wild type.   She is also status post Port-A-Cath insertion that was done on 01/05/2013.  FOLFOX chemotherapy started 01/18/2013. Avastin to be added with cycle 2 on 02/01/2013. She is S/P 12 cycles completed in 05/2013.   She is a status post microwave ablation of metastatic lesion in the posterior segment of the right lobe of the liver completed on 09/28/2014 and repeated on 11/30/2014.  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   Current therapy: FOLFIRI and a Avastin salvage therapy started on 12/10/2015. She has received 5-FU, leucovorin and a Avastin only starting with cycle 10. She is status post 14 cycles of therapy. Cycle 15 to start on 07/29/2016 after a treatment break.  Interim History:  Margaret Elliott presents today for a followup visit. Since her last visit, she went on a brief treatment break for the last month and now ready to resume systemic chemotherapy. She also was evaluated at [facility] for possible surgical intervention and intraperitoneal chemotherapy. She is refusing this option at this time.   She reports feeling well  without any delayed complications related to chemotherapy. She does report some mild nausea in the morning that resolves during the day. She denied any vomiting or diarrhea. She denied any worsening neuropathy. She remains active and attends to activities of daily living. She still able to work at this time.  She is not report any headaches, blurry vision, syncope or seizures. She does not report any fevers, chills or sweats. She does not report any chest pain, palpitation or orthopnea. She does not report any wheezing, hemoptysis or hematemesis. She does not report any frequency urgency or hesitancy. Does not report any lymphadenopathy or petechiae. Rest of her review of systems unremarkable.   Medications: I have reviewed the patient's current medications.  Current Outpatient Prescriptions  Medication Sig Dispense Refill  . acetaminophen (TYLENOL) 500 MG tablet Take 500 mg by mouth every 6 (six) hours as needed for pain.      . diphenhydrAMINE (BENADRYL) 25 mg capsule Take 25-50 mg by mouth daily as needed for itching. For allergic reaction      . lidocaine-prilocaine (EMLA) cream Apply 1 application topically as needed. Apply approx 1/2 tsp to skin over port, prior to chemotherapy treatments  1 kit  3  . Multiple Vitamin (MULTIVITAMIN) tablet Take 1 tablet by mouth daily.      . ondansetron (ZOFRAN) 8 MG tablet Take 1 tablet (8 mg total) by mouth every 8 (eight) hours as needed for nausea.  30 tablet  1   No current facility-administered medications for this visit.     Allergies: No Known Allergies  Past Medical History, Surgical history, Social history, and Family History were  reviewed and updated.    Physical Exam:  Blood pressure 132/88, pulse 86, temperature 97.6 F (36.4 C), temperature source Oral, resp. rate 18, height '5\' 4"'$  (1.626 m), weight 166 lb 11.2 oz (75.6 kg), SpO2 100 %. ECOG: 0 General appearance: Alert, awake woman without distress. Head: No oral ulcers or lesions.  No thrush noted. Neck: no adenopathy. Thyroid masses. Lymph nodes: Cervical, supraclavicular, and axillary nodes normal. Heart:regular rate and rhythm, S1, S2 normal, no murmur, rubs or gallops. Lung:chest clear, no wheezing, rales, normal symmetric air entry no dullness to percussion. Chest wall examination: Revealed Port-A-Cath site appeared clean, dry without any thrombosis or bleeding. Abdomen: soft, non-tender, without masses or nodularity noted. No shifting dullness or ascites. Neurological: No new neurological deficits noted.  CBC    Component Value Date/Time   WBC 4.1 06/17/2016 0832   WBC 4.0 11/20/2015 0735   RBC 3.80 06/17/2016 0832   RBC 3.95 11/20/2015 0735   HGB 12.0 06/17/2016 0832   HCT 36.6 06/17/2016 0832   PLT 150 06/17/2016 0832   MCV 96.3 06/17/2016 0832   MCH 31.6 06/17/2016 0832   MCH 30.6 11/20/2015 0735   MCHC 32.8 06/17/2016 0832   MCHC 34.1 11/20/2015 0735   RDW 14.7 (H) 06/17/2016 0832   LYMPHSABS 1.4 06/17/2016 0832   MONOABS 0.4 06/17/2016 0832   EOSABS 0.1 06/17/2016 0832   BASOSABS 0.0 06/17/2016 0832   Results for Margaret Elliott, Margaret Elliott (MRN [id number]) as of 07/21/2016 09:13  Ref. Range 05/20/2016 09:27 06/03/2016 09:38 06/17/2016 08:32  CEA ([facility]) Latest Ref Range: 0.00 - 5.00 ng/mL 8.00 (H) 7.15 (H) 5.10 (H)      EXAM: CT CHEST, ABDOMEN, AND PELVIS WITH CONTRAST  TECHNIQUE: Multidetector CT imaging of the chest, abdomen and pelvis was performed following the standard protocol during bolus administration of intravenous contrast.  CONTRAST:  153m ISOVUE-300 IOPAMIDOL (ISOVUE-300) INJECTION 61%  COMPARISON:  Multiple exams, including 04/07/2016  FINDINGS: CT CHEST FINDINGS  Cardiovascular: Mild atherosclerotic calcification of the aortic arch.  Mediastinum/Nodes: Stable speckled density along the thymic tissue. No adenopathy in the chest.  Lungs/Pleura: 2 by 3 mm left upper lobe calcified granuloma, image 23/4.  Right upper lobe 3 by 4 mm subpleural nodule anteriorly on image 67/4 common no change back through 03/13/2013, considered benign.  Musculoskeletal: Thoracic spondylosis.  CT ABDOMEN PELVIS FINDINGS  Hepatobiliary: 5.2 by 2.0 cm stable hypodense lesion posteriorly in the right hepatic lobe with some enhancement along its margins. No additional significant hepatic lesion. Contracted gallbladder.  Pancreas: No significant abnormality.  Spleen: Unremarkable  Adrenals/Urinary Tract: Adrenal glands normal. Right kidney upper pole cyst. No discrete urinary tract calculi. Several additional tiny hypodense renal lesions are likely cysts but technically too small to characterize.  Stomach/Bowel: Scattered colonic diverticula.  Vascular/Lymphatic: Unremarkable  Reproductive: Left adnexal cystic lesion 3.6 by 2.8 cm, formerly 4.5 by 5.3 cm. Uterus absent. Mildly prominent right ovary 4.1 by 2.1 cm on image 95/2, formerly 4.1 by 2.4 cm. Faint internal hypodensity.  Other: Right lower quadrant omental tumor measures 1.1 cm in thickness on image 78/2, previously 1.4 cm. Other foci of omental tumor are again noted. An indistinct positive tumor anterior to the proximal descending colon is noted, fairly similar to prior.  Musculoskeletal: Mild bilateral foraminal stenosis at L4-5 due to disc uncovering and facet arthropathy. Mild bilateral foraminal stenosis at L5-S1 due to disc bulge and facet arthropathy. Grade 1 anterolisthesis at L4-5.  IMPRESSION: 1. Mildly reduced size of the omental metastatic  deposits. 2. Stable hypodense ablation defect posteriorly in the right hepatic lobe. The continues to be some mildly irregular peripheral enhancement warranting surveillance. 3. Reduction in size of both ovaries, with the cystic lesion of the left ovary significantly smaller in size. 4. Foraminal impingement at L4-5 at L5-S1 due to spondylosis, degenerative disc disease, and  disc uncovering.   Impression and Plan:  66 year old woman with the following issues:   1. Advanced Colon cancer. She presented with a large right cecal mass causing obstruction and abscess and appendicitis. After surgical resection she received 12 cycles of chemotherapy with PET/CT scan on 09/12/2013 showed no residual disease actually in the liver that is hypermetabolic.  She is status post liver directed therapy for metastatic disease and to the liver stream and was given in June 2016.  Her PET/CT scan on 11/08/2015 showed progressive omental and peritoneal metastasis. Biopsy proven to be adenocarcinoma of the colon that is KRAS wild-type but NRAS mutated.  She is on salvage FOLFIRI and a Avastin that is well-tolerated. CPT-11 was removed for better tolerance after cycle 10.  CT scan obtain on 12/26/2017continues to show reasonable response to therapy with decrease in the size of her omental metastasis.  I gave her the option of surgical intervention that includes omentectomy and possible oophorectomy and liver resection and intraperitoneal chemotherapy. She declined this option after meeting with surgical oncology at [facility].  After discussing the risks and benefits of resuming systemic chemotherapy, she is agreeable to proceed. She will receive 5-FU, leucovorin with a Avastin without CPT-11. Chemotherapy will be given his infusional 5-FU via pump which will resume on 07/29/2016.    2. Intravenous access. Port-A-Cath site appeared normal. This will be utilized with systemic therapy every 2 weeks.  3. Neuropathy: Manageable at this time we'll continue to address her moving forward.  4. Antiemetics: Compazine is available to the patient and has not been problematic.  5. Neutropenia: Neulasta will be started at this time unless CPT-11 is added in the future.  6. Followup: Will be in one week to receive cycle 15 of therapy.   [facility]  MD 07/21/16

## 2016-07-21 NOTE — Telephone Encounter (Signed)
Appointments scheduled per 1/30 LOS. Patient given AVS report and calendars with future scheduled appointments. °

## 2016-07-29 ENCOUNTER — Other Ambulatory Visit (HOSPITAL_BASED_OUTPATIENT_CLINIC_OR_DEPARTMENT_OTHER): Payer: Medicare Other

## 2016-07-29 ENCOUNTER — Ambulatory Visit (HOSPITAL_BASED_OUTPATIENT_CLINIC_OR_DEPARTMENT_OTHER): Payer: Medicare Other

## 2016-07-29 VITALS — BP 122/86 | HR 86 | Temp 98.3°F | Resp 18

## 2016-07-29 DIAGNOSIS — C180 Malignant neoplasm of cecum: Secondary | ICD-10-CM

## 2016-07-29 DIAGNOSIS — Z5111 Encounter for antineoplastic chemotherapy: Secondary | ICD-10-CM | POA: Diagnosis not present

## 2016-07-29 DIAGNOSIS — C189 Malignant neoplasm of colon, unspecified: Secondary | ICD-10-CM

## 2016-07-29 DIAGNOSIS — C787 Secondary malignant neoplasm of liver and intrahepatic bile duct: Principal | ICD-10-CM

## 2016-07-29 DIAGNOSIS — Z5112 Encounter for antineoplastic immunotherapy: Secondary | ICD-10-CM

## 2016-07-29 DIAGNOSIS — K6389 Other specified diseases of intestine: Secondary | ICD-10-CM

## 2016-07-29 DIAGNOSIS — K769 Liver disease, unspecified: Secondary | ICD-10-CM

## 2016-07-29 LAB — CBC WITH DIFFERENTIAL/PLATELET
BASO%: 0.4 % (ref 0.0–2.0)
BASOS ABS: 0 10*3/uL (ref 0.0–0.1)
EOS%: 2.5 % (ref 0.0–7.0)
Eosinophils Absolute: 0.1 10*3/uL (ref 0.0–0.5)
HEMATOCRIT: 40.6 % (ref 34.8–46.6)
HGB: 13 g/dL (ref 11.6–15.9)
LYMPH#: 1.6 10*3/uL (ref 0.9–3.3)
LYMPH%: 34.7 % (ref 14.0–49.7)
MCH: 31.2 pg (ref 25.1–34.0)
MCHC: 32 g/dL (ref 31.5–36.0)
MCV: 97.4 fL (ref 79.5–101.0)
MONO#: 0.4 10*3/uL (ref 0.1–0.9)
MONO%: 8.9 % (ref 0.0–14.0)
NEUT#: 2.5 10*3/uL (ref 1.5–6.5)
NEUT%: 53.5 % (ref 38.4–76.8)
PLATELETS: 221 10*3/uL (ref 145–400)
RBC: 4.17 10*6/uL (ref 3.70–5.45)
RDW: 14.7 % — ABNORMAL HIGH (ref 11.2–14.5)
WBC: 4.7 10*3/uL (ref 3.9–10.3)

## 2016-07-29 LAB — COMPREHENSIVE METABOLIC PANEL
ALT: 13 U/L (ref 0–55)
ANION GAP: 7 meq/L (ref 3–11)
AST: 19 U/L (ref 5–34)
Albumin: 3.6 g/dL (ref 3.5–5.0)
Alkaline Phosphatase: 70 U/L (ref 40–150)
BUN: 18.4 mg/dL (ref 7.0–26.0)
CALCIUM: 10.1 mg/dL (ref 8.4–10.4)
CHLORIDE: 106 meq/L (ref 98–109)
CO2: 25 mEq/L (ref 22–29)
CREATININE: 0.9 mg/dL (ref 0.6–1.1)
EGFR: 78 mL/min/{1.73_m2} — AB (ref >90)
Glucose: 88 mg/dl (ref 70–140)
POTASSIUM: 4.2 meq/L (ref 3.5–5.1)
Sodium: 138 mEq/L (ref 136–145)
Total Bilirubin: 0.79 mg/dL (ref 0.20–1.20)
Total Protein: 7.7 g/dL (ref 6.4–8.3)

## 2016-07-29 LAB — CEA (IN HOUSE-CHCC): CEA (CHCC-IN HOUSE): 2.83 ng/mL (ref 0.00–5.00)

## 2016-07-29 MED ORDER — DEXAMETHASONE SODIUM PHOSPHATE 10 MG/ML IJ SOLN
10.0000 mg | Freq: Once | INTRAMUSCULAR | Status: AC
  Administered 2016-07-29: 10 mg via INTRAVENOUS

## 2016-07-29 MED ORDER — BEVACIZUMAB CHEMO INJECTION 400 MG/16ML
5.0000 mg/kg | Freq: Once | INTRAVENOUS | Status: AC
  Administered 2016-07-29: 400 mg via INTRAVENOUS
  Filled 2016-07-29: qty 16

## 2016-07-29 MED ORDER — SODIUM CHLORIDE 0.9 % IV SOLN
2400.0000 mg/m2 | INTRAVENOUS | Status: DC
  Administered 2016-07-29: 4500 mg via INTRAVENOUS
  Filled 2016-07-29: qty 90

## 2016-07-29 MED ORDER — FLUOROURACIL CHEMO INJECTION 2.5 GM/50ML
400.0000 mg/m2 | Freq: Once | INTRAVENOUS | Status: AC
  Administered 2016-07-29: 750 mg via INTRAVENOUS
  Filled 2016-07-29: qty 15

## 2016-07-29 MED ORDER — SODIUM CHLORIDE 0.9 % IV SOLN
Freq: Once | INTRAVENOUS | Status: AC
  Administered 2016-07-29: 11:00:00 via INTRAVENOUS

## 2016-07-29 MED ORDER — PALONOSETRON HCL INJECTION 0.25 MG/5ML
INTRAVENOUS | Status: AC
  Filled 2016-07-29: qty 5

## 2016-07-29 MED ORDER — PALONOSETRON HCL INJECTION 0.25 MG/5ML
0.2500 mg | Freq: Once | INTRAVENOUS | Status: AC
  Administered 2016-07-29: 0.25 mg via INTRAVENOUS

## 2016-07-29 MED ORDER — LEUCOVORIN CALCIUM INJECTION 350 MG
400.0000 mg/m2 | Freq: Once | INTRAVENOUS | Status: AC
  Administered 2016-07-29: 752 mg via INTRAVENOUS
  Filled 2016-07-29: qty 37.6

## 2016-07-29 MED ORDER — DEXAMETHASONE SODIUM PHOSPHATE 10 MG/ML IJ SOLN
INTRAMUSCULAR | Status: AC
  Filled 2016-07-29: qty 1

## 2016-07-31 ENCOUNTER — Ambulatory Visit (HOSPITAL_BASED_OUTPATIENT_CLINIC_OR_DEPARTMENT_OTHER): Payer: Medicare Other

## 2016-07-31 VITALS — BP 114/70 | HR 85 | Temp 98.1°F | Resp 18

## 2016-07-31 DIAGNOSIS — Z452 Encounter for adjustment and management of vascular access device: Secondary | ICD-10-CM | POA: Diagnosis not present

## 2016-07-31 DIAGNOSIS — C180 Malignant neoplasm of cecum: Secondary | ICD-10-CM | POA: Diagnosis not present

## 2016-07-31 DIAGNOSIS — K6389 Other specified diseases of intestine: Secondary | ICD-10-CM

## 2016-07-31 DIAGNOSIS — C189 Malignant neoplasm of colon, unspecified: Secondary | ICD-10-CM

## 2016-07-31 DIAGNOSIS — K769 Liver disease, unspecified: Secondary | ICD-10-CM

## 2016-07-31 MED ORDER — SODIUM CHLORIDE 0.9% FLUSH
10.0000 mL | INTRAVENOUS | Status: DC | PRN
  Filled 2016-07-31: qty 10

## 2016-07-31 MED ORDER — HEPARIN SOD (PORK) LOCK FLUSH 100 UNIT/ML IV SOLN
500.0000 [IU] | Freq: Once | INTRAVENOUS | Status: DC | PRN
  Filled 2016-07-31: qty 5

## 2016-08-12 ENCOUNTER — Ambulatory Visit (HOSPITAL_BASED_OUTPATIENT_CLINIC_OR_DEPARTMENT_OTHER): Payer: Medicare Other | Admitting: Oncology

## 2016-08-12 ENCOUNTER — Telehealth: Payer: Self-pay | Admitting: *Deleted

## 2016-08-12 ENCOUNTER — Ambulatory Visit (HOSPITAL_BASED_OUTPATIENT_CLINIC_OR_DEPARTMENT_OTHER): Payer: Medicare Other

## 2016-08-12 ENCOUNTER — Other Ambulatory Visit (HOSPITAL_BASED_OUTPATIENT_CLINIC_OR_DEPARTMENT_OTHER): Payer: Medicare Other

## 2016-08-12 ENCOUNTER — Ambulatory Visit: Payer: Medicare Other

## 2016-08-12 ENCOUNTER — Telehealth: Payer: Self-pay | Admitting: Oncology

## 2016-08-12 VITALS — BP 126/83

## 2016-08-12 VITALS — BP 132/96 | HR 85 | Temp 98.2°F | Resp 18 | Ht 64.0 in | Wt 166.7 lb

## 2016-08-12 DIAGNOSIS — Z5111 Encounter for antineoplastic chemotherapy: Secondary | ICD-10-CM | POA: Diagnosis not present

## 2016-08-12 DIAGNOSIS — C180 Malignant neoplasm of cecum: Secondary | ICD-10-CM | POA: Diagnosis not present

## 2016-08-12 DIAGNOSIS — Z5112 Encounter for antineoplastic immunotherapy: Secondary | ICD-10-CM | POA: Diagnosis present

## 2016-08-12 DIAGNOSIS — C786 Secondary malignant neoplasm of retroperitoneum and peritoneum: Secondary | ICD-10-CM | POA: Diagnosis not present

## 2016-08-12 DIAGNOSIS — G609 Hereditary and idiopathic neuropathy, unspecified: Secondary | ICD-10-CM

## 2016-08-12 DIAGNOSIS — K6389 Other specified diseases of intestine: Secondary | ICD-10-CM

## 2016-08-12 DIAGNOSIS — K769 Liver disease, unspecified: Secondary | ICD-10-CM

## 2016-08-12 DIAGNOSIS — D709 Neutropenia, unspecified: Secondary | ICD-10-CM | POA: Diagnosis not present

## 2016-08-12 DIAGNOSIS — C189 Malignant neoplasm of colon, unspecified: Secondary | ICD-10-CM

## 2016-08-12 DIAGNOSIS — C787 Secondary malignant neoplasm of liver and intrahepatic bile duct: Secondary | ICD-10-CM

## 2016-08-12 DIAGNOSIS — Z95828 Presence of other vascular implants and grafts: Secondary | ICD-10-CM

## 2016-08-12 LAB — COMPREHENSIVE METABOLIC PANEL
ALBUMIN: 3.4 g/dL — AB (ref 3.5–5.0)
ALK PHOS: 82 U/L (ref 40–150)
ALT: 11 U/L (ref 0–55)
AST: 15 U/L (ref 5–34)
Anion Gap: 8 mEq/L (ref 3–11)
BUN: 19.4 mg/dL (ref 7.0–26.0)
CALCIUM: 9.6 mg/dL (ref 8.4–10.4)
CO2: 23 mEq/L (ref 22–29)
CREATININE: 0.9 mg/dL (ref 0.6–1.1)
Chloride: 108 mEq/L (ref 98–109)
EGFR: 76 mL/min/{1.73_m2} — ABNORMAL LOW (ref >90)
GLUCOSE: 101 mg/dL (ref 70–140)
Potassium: 4.1 mEq/L (ref 3.5–5.1)
Sodium: 139 mEq/L (ref 136–145)
Total Bilirubin: 0.55 mg/dL (ref 0.20–1.20)
Total Protein: 7.3 g/dL (ref 6.4–8.3)

## 2016-08-12 LAB — CBC WITH DIFFERENTIAL/PLATELET
BASO%: 0.3 % (ref 0.0–2.0)
Basophils Absolute: 0 10*3/uL (ref 0.0–0.1)
EOS%: 2.4 % (ref 0.0–7.0)
Eosinophils Absolute: 0.1 10*3/uL (ref 0.0–0.5)
HEMATOCRIT: 37.3 % (ref 34.8–46.6)
HEMOGLOBIN: 12.4 g/dL (ref 11.6–15.9)
LYMPH#: 1.5 10*3/uL (ref 0.9–3.3)
LYMPH%: 35.7 % (ref 14.0–49.7)
MCH: 31.6 pg (ref 25.1–34.0)
MCHC: 33.1 g/dL (ref 31.5–36.0)
MCV: 95.4 fL (ref 79.5–101.0)
MONO#: 0.4 10*3/uL (ref 0.1–0.9)
MONO%: 8.6 % (ref 0.0–14.0)
NEUT%: 53 % (ref 38.4–76.8)
NEUTROS ABS: 2.2 10*3/uL (ref 1.5–6.5)
Platelets: 192 10*3/uL (ref 145–400)
RBC: 3.91 10*6/uL (ref 3.70–5.45)
RDW: 15.9 % — ABNORMAL HIGH (ref 11.2–14.5)
WBC: 4.2 10*3/uL (ref 3.9–10.3)

## 2016-08-12 LAB — CEA (IN HOUSE-CHCC): CEA (CHCC-IN HOUSE): 2.28 ng/mL (ref 0.00–5.00)

## 2016-08-12 MED ORDER — PALONOSETRON HCL INJECTION 0.25 MG/5ML
0.2500 mg | Freq: Once | INTRAVENOUS | Status: AC
  Administered 2016-08-12: 0.25 mg via INTRAVENOUS

## 2016-08-12 MED ORDER — DEXAMETHASONE SODIUM PHOSPHATE 10 MG/ML IJ SOLN
10.0000 mg | Freq: Once | INTRAMUSCULAR | Status: AC
  Administered 2016-08-12: 10 mg via INTRAVENOUS

## 2016-08-12 MED ORDER — SODIUM CHLORIDE 0.9 % IJ SOLN
10.0000 mL | INTRAMUSCULAR | Status: DC | PRN
  Administered 2016-08-12: 10 mL via INTRAVENOUS
  Filled 2016-08-12: qty 10

## 2016-08-12 MED ORDER — SODIUM CHLORIDE 0.9 % IV SOLN
Freq: Once | INTRAVENOUS | Status: AC
  Administered 2016-08-12: 09:00:00 via INTRAVENOUS

## 2016-08-12 MED ORDER — FLUOROURACIL CHEMO INJECTION 2.5 GM/50ML
400.0000 mg/m2 | Freq: Once | INTRAVENOUS | Status: AC
  Administered 2016-08-12: 750 mg via INTRAVENOUS
  Filled 2016-08-12: qty 15

## 2016-08-12 MED ORDER — SODIUM CHLORIDE 0.9 % IV SOLN
5.0000 mg/kg | Freq: Once | INTRAVENOUS | Status: AC
  Administered 2016-08-12: 400 mg via INTRAVENOUS
  Filled 2016-08-12: qty 16

## 2016-08-12 MED ORDER — PROCHLORPERAZINE MALEATE 10 MG PO TABS: 10.0000 mg | ORAL_TABLET | Freq: Four times a day (QID) | ORAL | 2 refills | Status: DC | PRN

## 2016-08-12 MED ORDER — PALONOSETRON HCL INJECTION 0.25 MG/5ML
INTRAVENOUS | Status: AC
  Filled 2016-08-12: qty 5

## 2016-08-12 MED ORDER — SODIUM CHLORIDE 0.9 % IV SOLN
2400.0000 mg/m2 | INTRAVENOUS | Status: DC
  Administered 2016-08-12: 4500 mg via INTRAVENOUS
  Filled 2016-08-12: qty 90

## 2016-08-12 MED ORDER — DEXAMETHASONE SODIUM PHOSPHATE 10 MG/ML IJ SOLN
INTRAMUSCULAR | Status: AC
  Filled 2016-08-12: qty 1

## 2016-08-12 MED ORDER — DEXTROSE 5 % IV SOLN
400.0000 mg/m2 | Freq: Once | INTRAVENOUS | Status: AC
  Administered 2016-08-12: 752 mg via INTRAVENOUS
  Filled 2016-08-12: qty 37.6

## 2016-08-12 NOTE — Telephone Encounter (Signed)
Per 2/21 LOS and staff message I have scheduled appts. Notified the scheduler 

## 2016-08-12 NOTE — Progress Notes (Signed)
Per Dr. Alen Blew, ok to proceed with Avastin treatment today without urine protein. Will need urine protein prior to next treatment.

## 2016-08-12 NOTE — Telephone Encounter (Signed)
Appointments scheduled per 2/21 LOS. Patient given AVS report and calendars with future scheduled appointments. °

## 2016-08-12 NOTE — Progress Notes (Signed)
Hematology and Oncology Follow Up Visit  Margaret Elliott [id number] [birth year]    08/12/16   Principle Diagnosis: 66 year old woman diagnosed with colon cancer in June 2014. She had presented with an abdominal pain and found to have a cecal mass resulting in obstruction and appendicitis. She had Likely has stage IV disease with a liver mass.    Prior Therapy:  She is status post laparoscopic laparotomy, evacuation of a pelvic abscess and right hemicolectomy with ileocolonic anastomosis done on December 09, 2012. The pathology showed an invasive, well- differentiated colorectal adenocarcinoma. Tumor invades through the muscularis propria, with 2/18 lymph nodes involved, with the pathological staging T3 N1b disease. Her tumor was found to be microsatellite stable. KRAS wild type.   She is also status post Port-A-Cath insertion that was done on 01/05/2013.  FOLFOX chemotherapy started 01/18/2013. Avastin to be added with cycle 2 on 02/01/2013. She is S/P 12 cycles completed in 05/2013.   She is a status post microwave ablation of metastatic lesion in the posterior segment of the right lobe of the liver completed on 09/28/2014 and repeated on 11/30/2014.  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   Current therapy: FOLFIRI and a Avastin salvage therapy started on 12/10/2015. She has received 5-FU, leucovorin and a Avastin only starting with cycle 10.  She is status post 15 cycles of therapy. Cycle 16 to start on 08/12/2016.  Interim History:  Margaret Elliott presents today for a followup visit. Since her last visit, she received cycle 15 of maintenance 5-FU and Avastin and tolerated it well. She denied any vomiting but did report some mild nausea that is manageable with Compazine for 2 days after infusion. She denied any diarrhea or infusion related complications. She denied any worsening neuropathy. She remains active and attends to  activities of daily living. She still able to work at this time. Her appetite remains about the same and her weight is stable.  She is not report any headaches, blurry vision, syncope or seizures. She does not report any fevers, chills or sweats. She does not report any chest pain, palpitation or orthopnea. She does not report any wheezing, hemoptysis or hematemesis. She does not report any frequency urgency or hesitancy. Does not report any lymphadenopathy or petechiae. Rest of her review of systems unremarkable.   Medications: I have reviewed the patient's current medications.  Current Outpatient Prescriptions  Medication Sig Dispense Refill  . acetaminophen (TYLENOL) 500 MG tablet Take 500 mg by mouth every 6 (six) hours as needed for pain.      . diphenhydrAMINE (BENADRYL) 25 mg capsule Take 25-50 mg by mouth daily as needed for itching. For allergic reaction      . lidocaine-prilocaine (EMLA) cream Apply 1 application topically as needed. Apply approx 1/2 tsp to skin over port, prior to chemotherapy treatments  1 kit  3  . Multiple Vitamin (MULTIVITAMIN) tablet Take 1 tablet by mouth daily.      . ondansetron (ZOFRAN) 8 MG tablet Take 1 tablet (8 mg total) by mouth every 8 (eight) hours as needed for nausea.  30 tablet  1   No current facility-administered medications for this visit.     Allergies: No Known Allergies  Past Medical History, Surgical history, Social history, and Family History were reviewed and updated.    Physical Exam:  Blood pressure (!) 132/96, pulse 85, temperature 98.2 F (36.8 C), temperature source Oral, resp. rate 18, height  _0  (1.626 m), weight 166 lb 11.2 oz (75.6 kg), SpO2 99 %. ECOG: 0 General appearance: Well-appearing woman appeared without distress. Head: No oral ulcers or lesions.  Neck: no adenopathy. Thyroid masses. Lymph nodes: Cervical, supraclavicular, and axillary nodes normal. Heart:regular rate and rhythm, S1, S2 normal, no murmur, rubs  or gallops. Lung:chest clear, no wheezing, rales, normal symmetric air entry no dullness to percussion. Chest wall examination: Revealed Port-A-Cath site without ecchymosis or drainage. Abdomen: soft, non-tender, without masses or nodularity noted. No shifting dullness or ascites. Neurological: No new neurological deficits noted.  CBC    Component Value Date/Time   WBC 4.2 08/12/2016 0752   WBC 4.0 11/20/2015 0735   RBC 3.91 08/12/2016 0752   RBC 3.95 11/20/2015 0735   HGB 12.4 08/12/2016 0752   HCT 37.3 08/12/2016 0752   PLT 192 08/12/2016 0752   MCV 95.4 08/12/2016 0752   MCH 31.6 08/12/2016 0752   MCH 30.6 11/20/2015 0735   MCHC 33.1 08/12/2016 0752   MCHC 34.1 11/20/2015 0735   RDW 15.9 (H) 08/12/2016 0752   LYMPHSABS 1.5 08/12/2016 0752   MONOABS 0.4 08/12/2016 0752   EOSABS 0.1 08/12/2016 0752   BASOSABS 0.0 08/12/2016 0752   Results for Margaret Elliott (MRN [id number]) as of 08/12/2016 08:17  Ref. Range 06/17/2016 08:32 07/29/2016 08:42  CEA ([facility]) Latest Ref Range: 0.00 - 5.00 ng/mL 5.10 (H) 2.83    Impression and Plan:  66 year old woman with the following issues:   1. Advanced Colon cancer. She presented with a large right cecal mass causing obstruction and abscess and appendicitis. After surgical resection she received 12 cycles of chemotherapy with PET/CT scan on 09/12/2013 showed no residual disease actually in the liver that is hypermetabolic.  She is status post liver directed therapy for metastatic disease and to the liver stream and was given in June 2016.  Her PET/CT scan on 11/08/2015 showed progressive omental and peritoneal metastasis. Biopsy proven to be adenocarcinoma of the colon that is KRAS wild-type but NRAS mutated.  She is on salvage FOLFIRI and a Avastin that is well-tolerated. CPT-11 was removed for better tolerance after cycle 10.  CT scan obtain on 12/26/2017continues to show reasonable response to therapy with decrease in the size  of her omental metastasis. She declined primary surgical therapy that included omentectomy.  She is currently receiving maintenance chemotherapy with 5-FU any Avastin. Risks and benefits of continuing this regimen were reviewed she is agreeable to continue. We will add CPT-11 to this regimen if needed to in the future if her disease status to progress.     2. Intravenous access. Port-A-Cath site appeared normal. This will be utilized with systemic therapy every 2 weeks.  3. Neuropathy: Unchanged from previous examination.  4. Antiemetics: Compazine is effective in managing her nausea. This will be refilled today.  5. Neutropenia: Neulasta will be started at this time unless CPT-11 is added in the future.  6. Followup: Will be in 2 weeks for the next cycle of chemotherapy.   [facility] MD 08/12/16

## 2016-08-12 NOTE — Patient Instructions (Signed)
[facility] Discharge Instructions for Patients Receiving Chemotherapy  Today you received the following chemotherapy agents:  Avastin, Irinotecan, Leucovorin, and 5FU.  To help prevent nausea and vomiting after your treatment, we encourage you to take your nausea medication as directed.   If you develop nausea and vomiting that is not controlled by your nausea medication, call the clinic.   BELOW ARE SYMPTOMS THAT SHOULD BE REPORTED IMMEDIATELY:  *FEVER GREATER THAN 100.5 F  *CHILLS WITH OR WITHOUT FEVER  NAUSEA AND VOMITING THAT IS NOT CONTROLLED WITH YOUR NAUSEA MEDICATION  *UNUSUAL SHORTNESS OF BREATH  *UNUSUAL BRUISING OR BLEEDING  TENDERNESS IN MOUTH AND THROAT WITH OR WITHOUT PRESENCE OF ULCERS  *URINARY PROBLEMS  *BOWEL PROBLEMS  UNUSUAL RASH Items with * indicate a potential emergency and should be followed up as soon as possible.  Feel free to call the clinic you have any questions or concerns. The clinic phone number is [phone].  Please show the CHEMO ALERT CARD at check-in to the Emergency Department and triage nurse.   

## 2016-08-14 ENCOUNTER — Ambulatory Visit (HOSPITAL_BASED_OUTPATIENT_CLINIC_OR_DEPARTMENT_OTHER): Payer: Self-pay

## 2016-08-14 VITALS — BP 129/85 | HR 87 | Temp 97.8°F | Resp 18

## 2016-08-14 DIAGNOSIS — K769 Liver disease, unspecified: Secondary | ICD-10-CM

## 2016-08-14 DIAGNOSIS — K6389 Other specified diseases of intestine: Secondary | ICD-10-CM

## 2016-08-14 DIAGNOSIS — K639 Disease of intestine, unspecified: Secondary | ICD-10-CM

## 2016-08-14 DIAGNOSIS — C189 Malignant neoplasm of colon, unspecified: Secondary | ICD-10-CM

## 2016-08-14 MED ORDER — HEPARIN SOD (PORK) LOCK FLUSH 100 UNIT/ML IV SOLN
500.0000 [IU] | Freq: Once | INTRAVENOUS | Status: AC | PRN
Start: 2016-08-14 — End: 2016-08-14
  Administered 2016-08-14: 500 [IU]
  Filled 2016-08-14: qty 5

## 2016-08-14 MED ORDER — SODIUM CHLORIDE 0.9% FLUSH
10.0000 mL | INTRAVENOUS | Status: DC | PRN
  Administered 2016-08-14: 10 mL
  Filled 2016-08-14: qty 10

## 2016-08-21 ENCOUNTER — Ambulatory Visit: Payer: Medicare Other | Admitting: Oncology

## 2016-08-26 ENCOUNTER — Other Ambulatory Visit: Payer: Self-pay | Admitting: *Deleted

## 2016-08-26 ENCOUNTER — Ambulatory Visit (HOSPITAL_BASED_OUTPATIENT_CLINIC_OR_DEPARTMENT_OTHER): Payer: Medicare Other | Admitting: Oncology

## 2016-08-26 ENCOUNTER — Ambulatory Visit: Payer: Medicare Other

## 2016-08-26 ENCOUNTER — Telehealth: Payer: Self-pay | Admitting: Oncology

## 2016-08-26 ENCOUNTER — Other Ambulatory Visit (HOSPITAL_BASED_OUTPATIENT_CLINIC_OR_DEPARTMENT_OTHER): Payer: Medicare Other

## 2016-08-26 ENCOUNTER — Ambulatory Visit (HOSPITAL_BASED_OUTPATIENT_CLINIC_OR_DEPARTMENT_OTHER): Payer: Medicare Other

## 2016-08-26 VITALS — BP 127/75 | HR 75

## 2016-08-26 VITALS — BP 138/90 | HR 85 | Temp 98.3°F | Resp 18 | Ht 64.0 in | Wt 165.8 lb

## 2016-08-26 DIAGNOSIS — Z79899 Other long term (current) drug therapy: Secondary | ICD-10-CM

## 2016-08-26 DIAGNOSIS — C189 Malignant neoplasm of colon, unspecified: Secondary | ICD-10-CM

## 2016-08-26 DIAGNOSIS — C180 Malignant neoplasm of cecum: Secondary | ICD-10-CM

## 2016-08-26 DIAGNOSIS — Z5111 Encounter for antineoplastic chemotherapy: Secondary | ICD-10-CM

## 2016-08-26 DIAGNOSIS — Z95828 Presence of other vascular implants and grafts: Secondary | ICD-10-CM

## 2016-08-26 DIAGNOSIS — G609 Hereditary and idiopathic neuropathy, unspecified: Secondary | ICD-10-CM | POA: Diagnosis not present

## 2016-08-26 DIAGNOSIS — Z5112 Encounter for antineoplastic immunotherapy: Secondary | ICD-10-CM | POA: Diagnosis present

## 2016-08-26 DIAGNOSIS — C787 Secondary malignant neoplasm of liver and intrahepatic bile duct: Secondary | ICD-10-CM | POA: Diagnosis not present

## 2016-08-26 DIAGNOSIS — K6389 Other specified diseases of intestine: Secondary | ICD-10-CM

## 2016-08-26 DIAGNOSIS — K769 Liver disease, unspecified: Secondary | ICD-10-CM

## 2016-08-26 LAB — CBC WITH DIFFERENTIAL/PLATELET
BASO%: 0.4 % (ref 0.0–2.0)
Basophils Absolute: 0 10*3/uL (ref 0.0–0.1)
EOS ABS: 0.1 10*3/uL (ref 0.0–0.5)
EOS%: 1.4 % (ref 0.0–7.0)
HCT: 37.2 % (ref 34.8–46.6)
HEMOGLOBIN: 12.4 g/dL (ref 11.6–15.9)
LYMPH%: 35.4 % (ref 14.0–49.7)
MCH: 32 pg (ref 25.1–34.0)
MCHC: 33.5 g/dL (ref 31.5–36.0)
MCV: 95.5 fL (ref 79.5–101.0)
MONO#: 0.4 10*3/uL (ref 0.1–0.9)
MONO%: 9.1 % (ref 0.0–14.0)
NEUT%: 53.7 % (ref 38.4–76.8)
NEUTROS ABS: 2.3 10*3/uL (ref 1.5–6.5)
PLATELETS: 207 10*3/uL (ref 145–400)
RBC: 3.89 10*6/uL (ref 3.70–5.45)
RDW: 16.4 % — AB (ref 11.2–14.5)
WBC: 4.3 10*3/uL (ref 3.9–10.3)
lymph#: 1.5 10*3/uL (ref 0.9–3.3)

## 2016-08-26 LAB — COMPREHENSIVE METABOLIC PANEL
ALBUMIN: 3.5 g/dL (ref 3.5–5.0)
ALK PHOS: 77 U/L (ref 40–150)
ALT: 9 U/L (ref 0–55)
ANION GAP: 7 meq/L (ref 3–11)
AST: 15 U/L (ref 5–34)
BILIRUBIN TOTAL: 0.66 mg/dL (ref 0.20–1.20)
BUN: 19 mg/dL (ref 7.0–26.0)
CO2: 24 mEq/L (ref 22–29)
CREATININE: 1 mg/dL (ref 0.6–1.1)
Calcium: 9.8 mg/dL (ref 8.4–10.4)
Chloride: 109 mEq/L (ref 98–109)
EGFR: 65 mL/min/{1.73_m2} — AB (ref >90)
GLUCOSE: 107 mg/dL (ref 70–140)
Potassium: 4.2 mEq/L (ref 3.5–5.1)
SODIUM: 141 meq/L (ref 136–145)
TOTAL PROTEIN: 7.4 g/dL (ref 6.4–8.3)

## 2016-08-26 LAB — UA PROTEIN, DIPSTICK - CHCC: Protein, ur: NEGATIVE mg/dL

## 2016-08-26 LAB — CEA (IN HOUSE-CHCC): CEA (CHCC-In House): 2.23 ng/mL (ref 0.00–5.00)

## 2016-08-26 MED ORDER — LEUCOVORIN CALCIUM INJECTION 350 MG
400.0000 mg/m2 | Freq: Once | INTRAMUSCULAR | Status: AC
  Administered 2016-08-26: 752 mg via INTRAVENOUS
  Filled 2016-08-26: qty 37.6

## 2016-08-26 MED ORDER — SODIUM CHLORIDE 0.9 % IV SOLN
5.0000 mg/kg | Freq: Once | INTRAVENOUS | Status: AC
  Administered 2016-08-26: 400 mg via INTRAVENOUS
  Filled 2016-08-26: qty 16

## 2016-08-26 MED ORDER — HYDROCODONE-ACETAMINOPHEN 5-325 MG PO TABS: 1.0000 | ORAL_TABLET | ORAL | 0 refills | Status: DC | PRN

## 2016-08-26 MED ORDER — DEXAMETHASONE SODIUM PHOSPHATE 10 MG/ML IJ SOLN
INTRAMUSCULAR | Status: AC
  Filled 2016-08-26: qty 1

## 2016-08-26 MED ORDER — PALONOSETRON HCL INJECTION 0.25 MG/5ML
0.2500 mg | Freq: Once | INTRAVENOUS | Status: AC
  Administered 2016-08-26: 0.25 mg via INTRAVENOUS

## 2016-08-26 MED ORDER — PALONOSETRON HCL INJECTION 0.25 MG/5ML
INTRAVENOUS | Status: AC
  Filled 2016-08-26: qty 5

## 2016-08-26 MED ORDER — DEXAMETHASONE SODIUM PHOSPHATE 10 MG/ML IJ SOLN
10.0000 mg | Freq: Once | INTRAMUSCULAR | Status: AC
  Administered 2016-08-26: 10 mg via INTRAVENOUS

## 2016-08-26 MED ORDER — SODIUM CHLORIDE 0.9 % IV SOLN
Freq: Once | INTRAVENOUS | Status: AC
  Administered 2016-08-26: 11:00:00 via INTRAVENOUS

## 2016-08-26 MED ORDER — FLUOROURACIL CHEMO INJECTION 2.5 GM/50ML
400.0000 mg/m2 | Freq: Once | INTRAVENOUS | Status: AC
  Administered 2016-08-26: 750 mg via INTRAVENOUS
  Filled 2016-08-26: qty 15

## 2016-08-26 MED ORDER — SODIUM CHLORIDE 0.9 % IJ SOLN
10.0000 mL | INTRAMUSCULAR | Status: DC | PRN
  Administered 2016-08-26: 10 mL via INTRAVENOUS
  Filled 2016-08-26: qty 10

## 2016-08-26 MED ORDER — SODIUM CHLORIDE 0.9 % IV SOLN
2400.0000 mg/m2 | INTRAVENOUS | Status: DC
  Administered 2016-08-26: 4500 mg via INTRAVENOUS
  Filled 2016-08-26: qty 90

## 2016-08-26 NOTE — Telephone Encounter (Signed)
Patient was scheduled for lab, Flush, follow up with Dr Alen Blew and Chemo for 09/23/16, per 08/26/16 los. Pumpm D/C scheduled for 09/25/16. Patient wwas given a copt y of the AV report and appointment schedule per 08/26/16 los.

## 2016-08-26 NOTE — Patient Instructions (Signed)

## 2016-08-26 NOTE — Progress Notes (Signed)
Hematology and Oncology Follow Up Visit  Margaret Elliott [id number] 1950/11/04    08/26/16   Principle Diagnosis: 66 year old woman diagnosed with colon cancer in June 2014. She had presented with an abdominal pain and found to have a cecal mass resulting in obstruction and appendicitis. She had Likely has stage IV disease with a liver mass.    Prior Therapy:  She is status post laparoscopic laparotomy, evacuation of a pelvic abscess and right hemicolectomy with ileocolonic anastomosis done on December 09, 2012. The pathology showed an invasive, well- differentiated colorectal adenocarcinoma. Tumor invades through the muscularis propria, with 2/18 lymph nodes involved, with the pathological staging T3 N1b disease. Her tumor was found to be microsatellite stable. KRAS wild type.   She is also status post Port-A-Cath insertion that was done on 01/05/2013.  FOLFOX chemotherapy started 01/18/2013. Avastin to be added with cycle 2 on 02/01/2013. She is S/P 12 cycles completed in 05/2013.   She is a status post microwave ablation of metastatic lesion in the posterior segment of the right lobe of the liver completed on 09/28/2014 and repeated on 11/30/2014.  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   Current therapy: FOLFIRI and a Avastin salvage therapy started on 12/10/2015. She has received 5-FU, leucovorin and a Avastin only starting with cycle 10.  She is here for cycle 17 of therapy.  Interim History:  Margaret Elliott presents today for a followup visit. Since her last visit, she resumed chemotherapy in the form of maintenance 5-FU and Avastin and tolerated it well. She did report some mild nausea after the pump discontinuation but denied any vomiting. She denied any diarrhea or infusion related complications. She denied any worsening neuropathy. She remains active and attends to activities of daily living. She does report some mild  fatigue at times and she does require periodic not during the day.  She is not report any headaches, blurry vision, syncope or seizures. She does not report any fevers, chills or sweats. She does not report any chest pain, palpitation or orthopnea. She does not report any wheezing, hemoptysis or hematemesis. She does not report any frequency urgency or hesitancy. Does not report any lymphadenopathy or petechiae. Rest of her review of systems unremarkable.   Medications: I have reviewed the patient's current medications.  Current Outpatient Prescriptions  Medication Sig Dispense Refill  . acetaminophen (TYLENOL) 500 MG tablet Take 500 mg by mouth every 6 (six) hours as needed for pain.      . diphenhydrAMINE (BENADRYL) 25 mg capsule Take 25-50 mg by mouth daily as needed for itching. For allergic reaction      . lidocaine-prilocaine (EMLA) cream Apply 1 application topically as needed. Apply approx 1/2 tsp to skin over port, prior to chemotherapy treatments  1 kit  3  . Multiple Vitamin (MULTIVITAMIN) tablet Take 1 tablet by mouth daily.      . ondansetron (ZOFRAN) 8 MG tablet Take 1 tablet (8 mg total) by mouth every 8 (eight) hours as needed for nausea.  30 tablet  1   No current facility-administered medications for this visit.     Allergies: No Known Allergies  Past Medical History, Surgical history, Social history, and Family History were reviewed and updated.    Physical Exam:  Blood pressure 138/90, pulse 85, temperature 98.3 F (36.8 C), temperature source Oral, resp. rate 18, height '5\' 4"'$  (1.626 m), weight 165 lb 12.8 oz (75.2 kg), SpO2 100 %.  ECOG: 0 General appearance: Well-appearing woman appeared without distress. Head: No oral ulcers or lesions.  Neck: no adenopathy. Thyroid masses. Lymph nodes: Cervical, supraclavicular, and axillary nodes normal. Heart:regular rate and rhythm, S1, S2 normal, no murmur, rubs or gallops. Lung:chest clear, no wheezing, rales, normal  symmetric air entry no dullness to percussion. Chest wall examination: Revealed Port-A-Cath site without ecchymosis or drainage. Abdomen: soft, non-tender, without masses or nodularity noted. No shifting dullness or ascites. Neurological: No new neurological deficits noted.  CBC    Component Value Date/Time   WBC 4.3 08/26/2016 0814   WBC 4.0 11/20/2015 0735   RBC 3.89 08/26/2016 0814   RBC 3.95 11/20/2015 0735   HGB 12.4 08/26/2016 0814   HCT 37.2 08/26/2016 0814   PLT 207 08/26/2016 0814   MCV 95.5 08/26/2016 0814   MCH 32.0 08/26/2016 0814   MCH 30.6 11/20/2015 0735   MCHC 33.5 08/26/2016 0814   MCHC 34.1 11/20/2015 0735   RDW 16.4 (H) 08/26/2016 0814   LYMPHSABS 1.5 08/26/2016 0814   MONOABS 0.4 08/26/2016 0814   EOSABS 0.1 08/26/2016 0814   BASOSABS 0.0 08/26/2016 0814    Results for Margaret Elliott, Margaret Elliott (MRN [id number]) as of 08/26/2016 08:41  Ref. Range 07/29/2016 08:42 08/12/2016 07:52  CE[facility] ([facility]) Latest Ref Range: 0.00 - 5.00 ng/mL 2.83 2.28    Impression and Plan:  66 year old woman with the following issues:   1. Advanced Colon cancer. She presented with a large right cecal mass causing obstruction and abscess and appendicitis. After surgical resection she received 12 cycles of chemotherapy with PET/CT scan on 09/12/2013 showed no residual disease actually in the liver that is hypermetabolic.  She is status post liver directed therapy for metastatic disease and to the liver stream and was given in June 2016.  Her PET/CT scan on 11/08/2015 showed progressive omental and peritoneal metastasis. Biopsy proven to be adenocarcinoma of the colon that is KRAS wild-type but NRAS mutated.  She is on salvage FOLFIRI and a Avastin that is well-tolerated. CPT-11 was removed for better tolerance after cycle 10.  CT scan obtain on 12/26/2017continues to show reasonable response to therapy with decrease in the size of her omental metastasis. She declined primary surgical  therapy that included omentectomy.  She is currently receiving maintenance chemotherapy with 5-FU any Avastin. After a brief treatment break she has resumed to therapy in February 2018 and if tolerated it well.  The plan is to proceed with the same dose and schedule and repeat imaging studies in January 2018.   2. Intravenous access. Port-A-Cath used any complications. She denied any pain or erythema associated with it.  3. Neuropathy: Unchanged from previous examination. Prescription for hydrocodone was refilled today.  4. Antiemetics: Compazine is effective in managing her nausea. This will be refilled on previous occasions.  5. Neutropenia: Neulasta will be started at this time unless CPT-11 is added in the future.  6. Followup: Will be in 2 weeks for the next cycle of chemotherapy.   [facility] MD 08/26/16

## 2016-08-26 NOTE — Patient Instructions (Signed)
[facility] Discharge Instructions for Patients Receiving Chemotherapy  Today you received the following chemotherapy agents Avastin/5 FU/Leucovorin To help prevent nausea and vomiting after your treatment, we encourage you to take your nausea medication as prescribed.   If you develop nausea and vomiting that is not controlled by your nausea medication, call the clinic.   BELOW ARE SYMPTOMS THAT SHOULD BE REPORTED IMMEDIATELY:  *FEVER GREATER THAN 100.5 F  *CHILLS WITH OR WITHOUT FEVER  NAUSEA AND VOMITING THAT IS NOT CONTROLLED WITH YOUR NAUSEA MEDICATION  *UNUSUAL SHORTNESS OF BREATH  *UNUSUAL BRUISING OR BLEEDING  TENDERNESS IN MOUTH AND THROAT WITH OR WITHOUT PRESENCE OF ULCERS  *URINARY PROBLEMS  *BOWEL PROBLEMS  UNUSUAL RASH Items with * indicate a potential emergency and should be followed up as soon as possible.  Feel free to call the clinic you have any questions or concerns. The clinic phone number is [phone].  Please show the [place] at check-in to the Emergency Department and triage nurse.

## 2016-08-28 ENCOUNTER — Ambulatory Visit (HOSPITAL_BASED_OUTPATIENT_CLINIC_OR_DEPARTMENT_OTHER): Payer: Medicare Other

## 2016-08-28 VITALS — BP 125/78 | HR 85 | Temp 98.2°F | Resp 20

## 2016-08-28 DIAGNOSIS — K769 Liver disease, unspecified: Secondary | ICD-10-CM

## 2016-08-28 DIAGNOSIS — C180 Malignant neoplasm of cecum: Secondary | ICD-10-CM | POA: Diagnosis not present

## 2016-08-28 DIAGNOSIS — Z452 Encounter for adjustment and management of vascular access device: Secondary | ICD-10-CM | POA: Diagnosis not present

## 2016-08-28 DIAGNOSIS — K6389 Other specified diseases of intestine: Secondary | ICD-10-CM

## 2016-08-28 DIAGNOSIS — C189 Malignant neoplasm of colon, unspecified: Secondary | ICD-10-CM

## 2016-08-28 MED ORDER — HEPARIN SOD (PORK) LOCK FLUSH 100 UNIT/ML IV SOLN
500.0000 [IU] | Freq: Once | INTRAVENOUS | Status: AC | PRN
  Administered 2016-08-28: 500 [IU]
  Filled 2016-08-28: qty 5

## 2016-08-28 MED ORDER — SODIUM CHLORIDE 0.9% FLUSH
10.0000 mL | INTRAVENOUS | Status: DC | PRN
  Administered 2016-08-28: 10 mL
  Filled 2016-08-28: qty 10

## 2016-09-09 ENCOUNTER — Ambulatory Visit (HOSPITAL_BASED_OUTPATIENT_CLINIC_OR_DEPARTMENT_OTHER): Payer: Medicare Other

## 2016-09-09 ENCOUNTER — Ambulatory Visit (HOSPITAL_BASED_OUTPATIENT_CLINIC_OR_DEPARTMENT_OTHER): Payer: Medicare Other | Admitting: Oncology

## 2016-09-09 ENCOUNTER — Other Ambulatory Visit (HOSPITAL_BASED_OUTPATIENT_CLINIC_OR_DEPARTMENT_OTHER): Payer: Medicare Other

## 2016-09-09 ENCOUNTER — Ambulatory Visit: Payer: Medicare Other

## 2016-09-09 ENCOUNTER — Telehealth: Payer: Self-pay | Admitting: Oncology

## 2016-09-09 VITALS — BP 124/76 | HR 75

## 2016-09-09 VITALS — BP 135/89 | HR 89 | Temp 98.6°F | Resp 19 | Wt 166.1 lb

## 2016-09-09 DIAGNOSIS — C180 Malignant neoplasm of cecum: Secondary | ICD-10-CM | POA: Diagnosis not present

## 2016-09-09 DIAGNOSIS — Z95828 Presence of other vascular implants and grafts: Secondary | ICD-10-CM

## 2016-09-09 DIAGNOSIS — C786 Secondary malignant neoplasm of retroperitoneum and peritoneum: Secondary | ICD-10-CM | POA: Diagnosis not present

## 2016-09-09 DIAGNOSIS — Z5112 Encounter for antineoplastic immunotherapy: Secondary | ICD-10-CM | POA: Diagnosis not present

## 2016-09-09 DIAGNOSIS — K6389 Other specified diseases of intestine: Secondary | ICD-10-CM

## 2016-09-09 DIAGNOSIS — C787 Secondary malignant neoplasm of liver and intrahepatic bile duct: Principal | ICD-10-CM

## 2016-09-09 DIAGNOSIS — D709 Neutropenia, unspecified: Secondary | ICD-10-CM | POA: Diagnosis not present

## 2016-09-09 DIAGNOSIS — C189 Malignant neoplasm of colon, unspecified: Secondary | ICD-10-CM

## 2016-09-09 DIAGNOSIS — C779 Secondary and unspecified malignant neoplasm of lymph node, unspecified: Secondary | ICD-10-CM | POA: Diagnosis not present

## 2016-09-09 DIAGNOSIS — K769 Liver disease, unspecified: Secondary | ICD-10-CM

## 2016-09-09 DIAGNOSIS — Z5111 Encounter for antineoplastic chemotherapy: Secondary | ICD-10-CM | POA: Diagnosis not present

## 2016-09-09 LAB — COMPREHENSIVE METABOLIC PANEL
ALT: 11 U/L (ref 0–55)
AST: 16 U/L (ref 5–34)
Albumin: 3.5 g/dL (ref 3.5–5.0)
Alkaline Phosphatase: 85 U/L (ref 40–150)
Anion Gap: 9 mEq/L (ref 3–11)
BILIRUBIN TOTAL: 0.74 mg/dL (ref 0.20–1.20)
BUN: 21.1 mg/dL (ref 7.0–26.0)
CALCIUM: 9.6 mg/dL (ref 8.4–10.4)
CO2: 23 mEq/L (ref 22–29)
Chloride: 108 mEq/L (ref 98–109)
Creatinine: 1.2 mg/dL — ABNORMAL HIGH (ref 0.6–1.1)
EGFR: 55 mL/min/{1.73_m2} — AB (ref >90)
Glucose: 110 mg/dl (ref 70–140)
Potassium: 4.1 mEq/L (ref 3.5–5.1)
Sodium: 140 mEq/L (ref 136–145)
TOTAL PROTEIN: 7.2 g/dL (ref 6.4–8.3)

## 2016-09-09 LAB — CBC WITH DIFFERENTIAL/PLATELET
BASO%: 0.2 % (ref 0.0–2.0)
Basophils Absolute: 0 10*3/uL (ref 0.0–0.1)
EOS%: 1.9 % (ref 0.0–7.0)
Eosinophils Absolute: 0.1 10*3/uL (ref 0.0–0.5)
HEMATOCRIT: 36.5 % (ref 34.8–46.6)
HGB: 12 g/dL (ref 11.6–15.9)
LYMPH%: 33.6 % (ref 14.0–49.7)
MCH: 31.4 pg (ref 25.1–34.0)
MCHC: 32.9 g/dL (ref 31.5–36.0)
MCV: 95.5 fL (ref 79.5–101.0)
MONO#: 0.4 10*3/uL (ref 0.1–0.9)
MONO%: 8.3 % (ref 0.0–14.0)
NEUT#: 2.4 10*3/uL (ref 1.5–6.5)
NEUT%: 56 % (ref 38.4–76.8)
PLATELETS: 184 10*3/uL (ref 145–400)
RBC: 3.82 10*6/uL (ref 3.70–5.45)
RDW: 16.7 % — ABNORMAL HIGH (ref 11.2–14.5)
WBC: 4.2 10*3/uL (ref 3.9–10.3)
lymph#: 1.4 10*3/uL (ref 0.9–3.3)

## 2016-09-09 LAB — CEA (IN HOUSE-CHCC): CEA (CHCC-IN HOUSE): 2.28 ng/mL (ref 0.00–5.00)

## 2016-09-09 MED ORDER — SODIUM CHLORIDE 0.9 % IJ SOLN
10.0000 mL | INTRAMUSCULAR | Status: DC | PRN
  Administered 2016-09-09: 10 mL via INTRAVENOUS
  Filled 2016-09-09: qty 10

## 2016-09-09 MED ORDER — PALONOSETRON HCL INJECTION 0.25 MG/5ML
INTRAVENOUS | Status: AC
  Filled 2016-09-09: qty 5

## 2016-09-09 MED ORDER — FLUOROURACIL CHEMO INJECTION 2.5 GM/50ML
400.0000 mg/m2 | Freq: Once | INTRAVENOUS | Status: AC
  Administered 2016-09-09: 750 mg via INTRAVENOUS
  Filled 2016-09-09: qty 15

## 2016-09-09 MED ORDER — HEPARIN SOD (PORK) LOCK FLUSH 100 UNIT/ML IV SOLN
500.0000 [IU] | Freq: Once | INTRAVENOUS | Status: DC | PRN
  Filled 2016-09-09: qty 5

## 2016-09-09 MED ORDER — DEXAMETHASONE SODIUM PHOSPHATE 10 MG/ML IJ SOLN
10.0000 mg | Freq: Once | INTRAMUSCULAR | Status: AC
  Administered 2016-09-09: 10 mg via INTRAVENOUS

## 2016-09-09 MED ORDER — SODIUM CHLORIDE 0.9% FLUSH
10.0000 mL | INTRAVENOUS | Status: DC | PRN
  Filled 2016-09-09: qty 10

## 2016-09-09 MED ORDER — DEXAMETHASONE SODIUM PHOSPHATE 10 MG/ML IJ SOLN
INTRAMUSCULAR | Status: AC
  Filled 2016-09-09: qty 1

## 2016-09-09 MED ORDER — PALONOSETRON HCL INJECTION 0.25 MG/5ML
0.2500 mg | Freq: Once | INTRAVENOUS | Status: AC
  Administered 2016-09-09: 0.25 mg via INTRAVENOUS

## 2016-09-09 MED ORDER — SODIUM CHLORIDE 0.9 % IV SOLN
Freq: Once | INTRAVENOUS | Status: AC
  Administered 2016-09-09: 10:00:00 via INTRAVENOUS

## 2016-09-09 MED ORDER — LEUCOVORIN CALCIUM INJECTION 350 MG
400.0000 mg/m2 | Freq: Once | INTRAVENOUS | Status: AC
  Administered 2016-09-09: 752 mg via INTRAVENOUS
  Filled 2016-09-09: qty 37.6

## 2016-09-09 MED ORDER — SODIUM CHLORIDE 0.9 % IV SOLN
2400.0000 mg/m2 | INTRAVENOUS | Status: DC
  Administered 2016-09-09: 4500 mg via INTRAVENOUS
  Filled 2016-09-09: qty 90

## 2016-09-09 MED ORDER — SODIUM CHLORIDE 0.9 % IV SOLN
5.0000 mg/kg | Freq: Once | INTRAVENOUS | Status: AC
  Administered 2016-09-09: 400 mg via INTRAVENOUS
  Filled 2016-09-09: qty 16

## 2016-09-09 NOTE — Patient Instructions (Signed)
[facility] Discharge Instructions for Patients Receiving Chemotherapy  Today you received the following chemotherapy agents Avastin/5 FU/Leucovorin To help prevent nausea and vomiting after your treatment, we encourage you to take your nausea medication as prescribed.   If you develop nausea and vomiting that is not controlled by your nausea medication, call the clinic.   BELOW ARE SYMPTOMS THAT SHOULD BE REPORTED IMMEDIATELY:  *FEVER GREATER THAN 100.5 F  *CHILLS WITH OR WITHOUT FEVER  NAUSEA AND VOMITING THAT IS NOT CONTROLLED WITH YOUR NAUSEA MEDICATION  *UNUSUAL SHORTNESS OF BREATH  *UNUSUAL BRUISING OR BLEEDING  TENDERNESS IN MOUTH AND THROAT WITH OR WITHOUT PRESENCE OF ULCERS  *URINARY PROBLEMS  *BOWEL PROBLEMS  UNUSUAL RASH Items with * indicate a potential emergency and should be followed up as soon as possible.  Feel free to call the clinic you have any questions or concerns. The clinic phone number is [phone].  Please show the [place] at check-in to the Emergency Department and triage nurse.

## 2016-09-09 NOTE — Patient Instructions (Signed)
Implanted Port Home Guide An implanted port is a type of central line that is placed under the skin. Central lines are used to provide IV access when treatment or nutrition needs to be given through a person's veins. Implanted ports are used for long-term IV access. An implanted port may be placed because:  You need IV medicine that would be irritating to the small veins in your hands or arms.  You need long-term IV medicines, such as antibiotics.  You need IV nutrition for a long period.  You need frequent blood draws for lab tests.  You need dialysis.  Implanted ports are usually placed in the chest area, but they can also be placed in the upper arm, the abdomen, or the leg. An implanted port has two main parts:  Reservoir. The reservoir is round and will appear as a small, raised area under your skin. The reservoir is the part where a needle is inserted to give medicines or draw blood.  Catheter. The catheter is a thin, flexible tube that extends from the reservoir. The catheter is placed into a large vein. Medicine that is inserted into the reservoir goes into the catheter and then into the vein.  How will I care for my incision site? Do not get the incision site wet. Bathe or shower as directed by your health care provider. How is my port accessed? Special steps must be taken to access the port:  Before the port is accessed, a numbing cream can be placed on the skin. This helps numb the skin over the port site.  Your health care provider uses a sterile technique to access the port. ? Your health care provider must put on a mask and sterile gloves. ? The skin over your port is cleaned carefully with an antiseptic and allowed to dry. ? The port is gently pinched between sterile gloves, and a needle is inserted into the port.  Only "non-coring" port needles should be used to access the port. Once the port is accessed, a blood return should be checked. This helps ensure that the port  is in the vein and is not clogged.  If your port needs to remain accessed for a constant infusion, a clear (transparent) bandage will be placed over the needle site. The bandage and needle will need to be changed every week, or as directed by your health care provider.  Keep the bandage covering the needle clean and dry. Do not get it wet. Follow your health care provider's instructions on how to take a shower or bath while the port is accessed.  If your port does not need to stay accessed, no bandage is needed over the port.  What is flushing? Flushing helps keep the port from getting clogged. Follow your health care provider's instructions on how and when to flush the port. Ports are usually flushed with saline solution or a medicine called heparin. The need for flushing will depend on how the port is used.  If the port is used for intermittent medicines or blood draws, the port will need to be flushed: ? After medicines have been given. ? After blood has been drawn. ? As part of routine maintenance.  If a constant infusion is running, the port may not need to be flushed.  How long will my port stay implanted? The port can stay in for as long as your health care provider thinks it is needed. When it is time for the port to come out, surgery will be   done to remove it. The procedure is similar to the one performed when the port was put in. When should I seek immediate medical care? When you have an implanted port, you should seek immediate medical care if:  You notice a bad smell coming from the incision site.  You have swelling, redness, or drainage at the incision site.  You have more swelling or pain at the port site or the surrounding area.  You have a fever that is not controlled with medicine.  This information is not intended to replace advice given to you by your health care provider. Make sure you discuss any questions you have with your health care provider. Document  Released: 06/08/2005 Document Revised: 11/14/2015 Document Reviewed: 02/13/2013 Elsevier Interactive Patient Education  2017 [employer]  

## 2016-09-09 NOTE — Progress Notes (Signed)
Hematology and Oncology Follow Up Visit  Margaret Elliott [id number] Nov 06, 1950    09/09/16   Principle Diagnosis: 66 year old woman diagnosed with colon cancer in June 2014. She had presented with an abdominal pain and found to have a cecal mass resulting in obstruction and appendicitis. She had Likely has stage IV disease with a liver mass.    Prior Therapy:  She is status post laparoscopic laparotomy, evacuation of a pelvic abscess and right hemicolectomy with ileocolonic anastomosis done on December 09, 2012. The pathology showed an invasive, well- differentiated colorectal adenocarcinoma. Tumor invades through the muscularis propria, with 2/18 lymph nodes involved, with the pathological staging T3 N1b disease. Her tumor was found to be microsatellite stable. KRAS wild type.   She is also status post Port-A-Cath insertion that was done on 01/05/2013.  FOLFOX chemotherapy started 01/18/2013. Avastin to be added with cycle 2 on 02/01/2013. She is S/P 12 cycles completed in 05/2013.   She is a status post microwave ablation of metastatic lesion in the posterior segment of the right lobe of the liver completed on 09/28/2014 and repeated on 11/30/2014.  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   Current therapy: FOLFIRI and a Avastin salvage therapy started on 12/10/2015. She has received 5-FU, leucovorin and a Avastin only starting with cycle 10.  She is here for cycle 18 of therapy.  Interim History:  Margaret Elliott presents today for a followup visit. Since her last visit, she reports no major changes in her health. She received her last cycle of chemotherapy in the form of maintenance 5-FU and Avastin and tolerated it well. She did report mild nausea after the pump discontinuation but denied any vomiting. Her nausea persists to let a bit longer than previously. Her nausea is predominantly in the morning and resolves later on the  day. She denied any diarrhea or infusion related complications. She denied any worsening neuropathy. She remains active and attends to activities of daily living. She continues to work full time without any decline in her ability to do so.  She is not report any headaches, blurry vision, syncope or seizures. She does not report any fevers, chills or sweats. She does not report any chest pain, palpitation or orthopnea. She does not report any wheezing, hemoptysis or hematemesis. She does not report any frequency urgency or hesitancy. Does not report any lymphadenopathy or petechiae. Rest of her review of systems unremarkable.   Medications: I have reviewed the patient's current medications.  Current Outpatient Prescriptions  Medication Sig Dispense Refill  . acetaminophen (TYLENOL) 500 MG tablet Take 500 mg by mouth every 6 (six) hours as needed for pain.      . diphenhydrAMINE (BENADRYL) 25 mg capsule Take 25-50 mg by mouth daily as needed for itching. For allergic reaction      . lidocaine-prilocaine (EMLA) cream Apply 1 application topically as needed. Apply approx 1/2 tsp to skin over port, prior to chemotherapy treatments  1 kit  3  . Multiple Vitamin (MULTIVITAMIN) tablet Take 1 tablet by mouth daily.      . ondansetron (ZOFRAN) 8 MG tablet Take 1 tablet (8 mg total) by mouth every 8 (eight) hours as needed for nausea.  30 tablet  1   No current facility-administered medications for this visit.     Allergies: No Known Allergies  Past Medical History, Surgical history, Social history, and Family History were reviewed and updated.    Physical  Exam:  Blood pressure 135/89, pulse 89, temperature 98.6 F (37 C), temperature source Oral, resp. rate 19, weight 166 lb 1.6 oz (75.3 kg), SpO2 98 %. ECOG: 0 General appearance: Alert, awake: Without distress. Head: No oral thrush or ulcers. Neck: no adenopathy.  Lymph nodes: Cervical, supraclavicular, and axillary nodes  normal. Heart:regular rate and rhythm, S1, S2 normal, no murmur, rubs or gallops. Lung:chest clear, no wheezing, rales, normal symmetric air entry.  Chest wall examination: Revealed Port-A-Cath site without ecchymosis or drainage. Abdomen: soft, non-tender, without masses or nodularity noted. No rebound or guarding. Neurological: No new neurological deficits noted.  CBC    Component Value Date/Time   WBC 4.2 09/09/2016 0756   WBC 4.0 11/20/2015 0735   RBC 3.82 09/09/2016 0756   RBC 3.95 11/20/2015 0735   HGB 12.0 09/09/2016 0756   HCT 36.5 09/09/2016 0756   PLT 184 09/09/2016 0756   MCV 95.5 09/09/2016 0756   MCH 31.4 09/09/2016 0756   MCH 30.6 11/20/2015 0735   MCHC 32.9 09/09/2016 0756   MCHC 34.1 11/20/2015 0735   RDW 16.7 (H) 09/09/2016 0756   LYMPHSABS 1.4 09/09/2016 0756   MONOABS 0.4 09/09/2016 0756   EOSABS 0.1 09/09/2016 0756   BASOSABS 0.0 09/09/2016 0756    Results for Margaret, Elliott (MRN [id number]) as of 09/09/2016 08:16  Ref. Range 07/29/2016 08:42 08/12/2016 07:52 08/26/2016 08:14  CEA ([facility]) Latest Ref Range: 0.00 - 5.00 ng/mL 2.83 2.28 2.23     Impression and Plan:  66 year old woman with the following issues:   1. Advanced Colon cancer. She presented with a large right cecal mass causing obstruction and abscess and appendicitis. After surgical resection she received 12 cycles of chemotherapy with PET/CT scan on 09/12/2013 showed no residual disease actually in the liver that is hypermetabolic.  She is status post liver directed therapy for metastatic disease and to the liver stream and was given in June 2016.  Her PET/CT scan on 11/08/2015 showed progressive omental and peritoneal metastasis. Biopsy proven to be adenocarcinoma of the colon that is KRAS wild-type but NRAS mutated.  She is on salvage FOLFIRI and a Avastin that is well-tolerated. CPT-11 was removed for better tolerance after cycle 10.  CT scan obtain on 12/26/2017continues to show  reasonable response to therapy with decrease in the size of her omental metastasis. She declined primary surgical therapy that included omentectomy.  She is currently receiving maintenance chemotherapy with 5-FU any Avastin. She tolerated the last treatment without major complications and the plan is to continue the same dose and schedule. We will repeat imaging studies in June 2018.     2. Intravenous access. Port-A-Cath used any complications noted since the last visit.  3. Neuropathy: Unchanged from previous examination. Prescription for hydrocodone is available to the patient and will be refilled periodically.  4. Antiemetics: Compazine is effective in managing her nausea. This will be refilled on previous occasions.  5. Neutropenia: Neulasta will be started at this time unless CPT-11 is added in the future.  6. Followup: Will be in 2 weeks for the next cycle of chemotherapy.   [facility] MD 09/09/16

## 2016-09-09 NOTE — Telephone Encounter (Signed)
Gave patient AVS and calender per 09/09/2016 los

## 2016-09-11 ENCOUNTER — Ambulatory Visit (HOSPITAL_BASED_OUTPATIENT_CLINIC_OR_DEPARTMENT_OTHER): Payer: Medicare Other

## 2016-09-11 VITALS — BP 140/82 | HR 75 | Temp 98.2°F | Resp 16

## 2016-09-11 DIAGNOSIS — K769 Liver disease, unspecified: Secondary | ICD-10-CM

## 2016-09-11 DIAGNOSIS — K6389 Other specified diseases of intestine: Secondary | ICD-10-CM

## 2016-09-11 DIAGNOSIS — C180 Malignant neoplasm of cecum: Secondary | ICD-10-CM | POA: Diagnosis not present

## 2016-09-11 DIAGNOSIS — C189 Malignant neoplasm of colon, unspecified: Secondary | ICD-10-CM

## 2016-09-11 MED ORDER — HEPARIN SOD (PORK) LOCK FLUSH 100 UNIT/ML IV SOLN
500.0000 [IU] | Freq: Once | INTRAVENOUS | Status: AC | PRN
  Administered 2016-09-11: 500 [IU]
  Filled 2016-09-11: qty 5

## 2016-09-11 MED ORDER — SODIUM CHLORIDE 0.9% FLUSH
10.0000 mL | INTRAVENOUS | Status: DC | PRN
  Administered 2016-09-11: 10 mL
  Filled 2016-09-11: qty 10

## 2016-09-11 NOTE — Patient Instructions (Signed)
Implanted Port Home Guide An implanted port is a type of central line that is placed under the skin. Central lines are used to provide IV access when treatment or nutrition needs to be given through a person's veins. Implanted ports are used for long-term IV access. An implanted port may be placed because:  You need IV medicine that would be irritating to the small veins in your hands or arms.  You need long-term IV medicines, such as antibiotics.  You need IV nutrition for a long period.  You need frequent blood draws for lab tests.  You need dialysis.  Implanted ports are usually placed in the chest area, but they can also be placed in the upper arm, the abdomen, or the leg. An implanted port has two main parts:  Reservoir. The reservoir is round and will appear as a small, raised area under your skin. The reservoir is the part where a needle is inserted to give medicines or draw blood.  Catheter. The catheter is a thin, flexible tube that extends from the reservoir. The catheter is placed into a large vein. Medicine that is inserted into the reservoir goes into the catheter and then into the vein.  How will I care for my incision site? Do not get the incision site wet. Bathe or shower as directed by your health care provider. How is my port accessed? Special steps must be taken to access the port:  Before the port is accessed, a numbing cream can be placed on the skin. This helps numb the skin over the port site.  Your health care provider uses a sterile technique to access the port. ? Your health care provider must put on a mask and sterile gloves. ? The skin over your port is cleaned carefully with an antiseptic and allowed to dry. ? The port is gently pinched between sterile gloves, and a needle is inserted into the port.  Only "non-coring" port needles should be used to access the port. Once the port is accessed, a blood return should be checked. This helps ensure that the port  is in the vein and is not clogged.  If your port needs to remain accessed for a constant infusion, a clear (transparent) bandage will be placed over the needle site. The bandage and needle will need to be changed every week, or as directed by your health care provider.  Keep the bandage covering the needle clean and dry. Do not get it wet. Follow your health care provider's instructions on how to take a shower or bath while the port is accessed.  If your port does not need to stay accessed, no bandage is needed over the port.  What is flushing? Flushing helps keep the port from getting clogged. Follow your health care provider's instructions on how and when to flush the port. Ports are usually flushed with saline solution or a medicine called heparin. The need for flushing will depend on how the port is used.  If the port is used for intermittent medicines or blood draws, the port will need to be flushed: ? After medicines have been given. ? After blood has been drawn. ? As part of routine maintenance.  If a constant infusion is running, the port may not need to be flushed.  How long will my port stay implanted? The port can stay in for as long as your health care provider thinks it is needed. When it is time for the port to come out, surgery will be   done to remove it. The procedure is similar to the one performed when the port was put in. When should I seek immediate medical care? When you have an implanted port, you should seek immediate medical care if:  You notice a bad smell coming from the incision site.  You have swelling, redness, or drainage at the incision site.  You have more swelling or pain at the port site or the surrounding area.  You have a fever that is not controlled with medicine.  This information is not intended to replace advice given to you by your health care provider. Make sure you discuss any questions you have with your health care provider. Document  Released: 06/08/2005 Document Revised: 11/14/2015 Document Reviewed: 02/13/2013 Elsevier Interactive Patient Education  2017 [employer]  

## 2016-09-23 ENCOUNTER — Telehealth: Payer: Self-pay | Admitting: Oncology

## 2016-09-23 ENCOUNTER — Ambulatory Visit (HOSPITAL_BASED_OUTPATIENT_CLINIC_OR_DEPARTMENT_OTHER): Payer: Medicare Other | Admitting: Oncology

## 2016-09-23 ENCOUNTER — Ambulatory Visit: Payer: Medicare Other

## 2016-09-23 ENCOUNTER — Other Ambulatory Visit (HOSPITAL_BASED_OUTPATIENT_CLINIC_OR_DEPARTMENT_OTHER): Payer: Medicare Other

## 2016-09-23 ENCOUNTER — Ambulatory Visit (HOSPITAL_BASED_OUTPATIENT_CLINIC_OR_DEPARTMENT_OTHER): Payer: Medicare Other

## 2016-09-23 VITALS — BP 143/90 | HR 82 | Temp 98.3°F | Resp 18 | Ht 64.0 in | Wt 167.6 lb

## 2016-09-23 DIAGNOSIS — Z5111 Encounter for antineoplastic chemotherapy: Secondary | ICD-10-CM

## 2016-09-23 DIAGNOSIS — C189 Malignant neoplasm of colon, unspecified: Secondary | ICD-10-CM

## 2016-09-23 DIAGNOSIS — C180 Malignant neoplasm of cecum: Secondary | ICD-10-CM

## 2016-09-23 DIAGNOSIS — K6389 Other specified diseases of intestine: Secondary | ICD-10-CM

## 2016-09-23 DIAGNOSIS — C787 Secondary malignant neoplasm of liver and intrahepatic bile duct: Secondary | ICD-10-CM

## 2016-09-23 DIAGNOSIS — Z5112 Encounter for antineoplastic immunotherapy: Secondary | ICD-10-CM | POA: Diagnosis not present

## 2016-09-23 DIAGNOSIS — Z95828 Presence of other vascular implants and grafts: Secondary | ICD-10-CM

## 2016-09-23 DIAGNOSIS — K769 Liver disease, unspecified: Secondary | ICD-10-CM

## 2016-09-23 LAB — CBC WITH DIFFERENTIAL/PLATELET
BASO%: 0.5 % (ref 0.0–2.0)
Basophils Absolute: 0 10*3/uL (ref 0.0–0.1)
EOS%: 2 % (ref 0.0–7.0)
Eosinophils Absolute: 0.1 10*3/uL (ref 0.0–0.5)
HCT: 35.4 % (ref 34.8–46.6)
HGB: 11.9 g/dL (ref 11.6–15.9)
LYMPH%: 28.7 % (ref 14.0–49.7)
MCH: 32.7 pg (ref 25.1–34.0)
MCHC: 33.6 g/dL (ref 31.5–36.0)
MCV: 97.3 fL (ref 79.5–101.0)
MONO#: 0.4 10*3/uL (ref 0.1–0.9)
MONO%: 9 % (ref 0.0–14.0)
NEUT#: 3 10*3/uL (ref 1.5–6.5)
NEUT%: 59.8 % (ref 38.4–76.8)
Platelets: 190 10*3/uL (ref 145–400)
RBC: 3.64 10*6/uL — ABNORMAL LOW (ref 3.70–5.45)
RDW: 18.4 % — ABNORMAL HIGH (ref 11.2–14.5)
WBC: 5 10*3/uL (ref 3.9–10.3)
lymph#: 1.4 10*3/uL (ref 0.9–3.3)

## 2016-09-23 LAB — COMPREHENSIVE METABOLIC PANEL
ALT: 10 U/L (ref 0–55)
ANION GAP: 10 meq/L (ref 3–11)
AST: 17 U/L (ref 5–34)
Albumin: 3.3 g/dL — ABNORMAL LOW (ref 3.5–5.0)
Alkaline Phosphatase: 95 U/L (ref 40–150)
BUN: 17.3 mg/dL (ref 7.0–26.0)
CO2: 22 meq/L (ref 22–29)
CREATININE: 1 mg/dL (ref 0.6–1.1)
Calcium: 9.4 mg/dL (ref 8.4–10.4)
Chloride: 108 mEq/L (ref 98–109)
EGFR: 67 mL/min/{1.73_m2} — ABNORMAL LOW (ref >90)
Glucose: 109 mg/dl (ref 70–140)
POTASSIUM: 4.1 meq/L (ref 3.5–5.1)
Sodium: 139 mEq/L (ref 136–145)
Total Bilirubin: 0.67 mg/dL (ref 0.20–1.20)
Total Protein: 7.1 g/dL (ref 6.4–8.3)

## 2016-09-23 LAB — CEA (IN HOUSE-CHCC): CEA (CHCC-In House): 2.25 ng/mL (ref 0.00–5.00)

## 2016-09-23 MED ORDER — FLUOROURACIL CHEMO INJECTION 2.5 GM/50ML
400.0000 mg/m2 | Freq: Once | INTRAVENOUS | Status: AC
  Administered 2016-09-23: 750 mg via INTRAVENOUS
  Filled 2016-09-23: qty 15

## 2016-09-23 MED ORDER — SODIUM CHLORIDE 0.9 % IV SOLN
5.0000 mg/kg | Freq: Once | INTRAVENOUS | Status: AC
  Administered 2016-09-23: 400 mg via INTRAVENOUS
  Filled 2016-09-23: qty 16

## 2016-09-23 MED ORDER — DEXAMETHASONE SODIUM PHOSPHATE 10 MG/ML IJ SOLN
10.0000 mg | Freq: Once | INTRAMUSCULAR | Status: AC
Start: 2016-09-23 — End: 2016-09-23
  Administered 2016-09-23: 10 mg via INTRAVENOUS

## 2016-09-23 MED ORDER — DEXAMETHASONE SODIUM PHOSPHATE 10 MG/ML IJ SOLN
INTRAMUSCULAR | Status: AC
Start: 2016-09-23 — End: 2016-09-23
  Filled 2016-09-23: qty 1

## 2016-09-23 MED ORDER — SODIUM CHLORIDE 0.9 % IJ SOLN
10.0000 mL | INTRAMUSCULAR | Status: DC | PRN
  Administered 2016-09-23: 10 mL via INTRAVENOUS
  Filled 2016-09-23: qty 10

## 2016-09-23 MED ORDER — SODIUM CHLORIDE 0.9 % IV SOLN
Freq: Once | INTRAVENOUS | Status: AC
  Administered 2016-09-23: 11:00:00 via INTRAVENOUS

## 2016-09-23 MED ORDER — LEUCOVORIN CALCIUM INJECTION 350 MG
400.0000 mg/m2 | Freq: Once | INTRAVENOUS | Status: AC
  Administered 2016-09-23: 752 mg via INTRAVENOUS
  Filled 2016-09-23: qty 37.6

## 2016-09-23 MED ORDER — SODIUM CHLORIDE 0.9 % IV SOLN
2400.0000 mg/m2 | INTRAVENOUS | Status: DC
  Administered 2016-09-23: 4500 mg via INTRAVENOUS
  Filled 2016-09-23: qty 90

## 2016-09-23 MED ORDER — PALONOSETRON HCL INJECTION 0.25 MG/5ML
0.2500 mg | Freq: Once | INTRAVENOUS | Status: AC
  Administered 2016-09-23: 0.25 mg via INTRAVENOUS

## 2016-09-23 MED ORDER — PALONOSETRON HCL INJECTION 0.25 MG/5ML
INTRAVENOUS | Status: AC
  Filled 2016-09-23: qty 5

## 2016-09-23 NOTE — Patient Instructions (Signed)
Implanted Port Home Guide An implanted port is a type of central line that is placed under the skin. Central lines are used to provide IV access when treatment or nutrition needs to be given through a person's veins. Implanted ports are used for long-term IV access. An implanted port may be placed because:  You need IV medicine that would be irritating to the small veins in your hands or arms.  You need long-term IV medicines, such as antibiotics.  You need IV nutrition for a long period.  You need frequent blood draws for lab tests.  You need dialysis.  Implanted ports are usually placed in the chest area, but they can also be placed in the upper arm, the abdomen, or the leg. An implanted port has two main parts:  Reservoir. The reservoir is round and will appear as a small, raised area under your skin. The reservoir is the part where a needle is inserted to give medicines or draw blood.  Catheter. The catheter is a thin, flexible tube that extends from the reservoir. The catheter is placed into a large vein. Medicine that is inserted into the reservoir goes into the catheter and then into the vein.  How will I care for my incision site? Do not get the incision site wet. Bathe or shower as directed by your health care provider. How is my port accessed? Special steps must be taken to access the port:  Before the port is accessed, a numbing cream can be placed on the skin. This helps numb the skin over the port site.  Your health care provider uses a sterile technique to access the port. ? Your health care provider must put on a mask and sterile gloves. ? The skin over your port is cleaned carefully with an antiseptic and allowed to dry. ? The port is gently pinched between sterile gloves, and a needle is inserted into the port.  Only "non-coring" port needles should be used to access the port. Once the port is accessed, a blood return should be checked. This helps ensure that the port  is in the vein and is not clogged.  If your port needs to remain accessed for a constant infusion, a clear (transparent) bandage will be placed over the needle site. The bandage and needle will need to be changed every week, or as directed by your health care provider.  Keep the bandage covering the needle clean and dry. Do not get it wet. Follow your health care provider's instructions on how to take a shower or bath while the port is accessed.  If your port does not need to stay accessed, no bandage is needed over the port.  What is flushing? Flushing helps keep the port from getting clogged. Follow your health care provider's instructions on how and when to flush the port. Ports are usually flushed with saline solution or a medicine called heparin. The need for flushing will depend on how the port is used.  If the port is used for intermittent medicines or blood draws, the port will need to be flushed: ? After medicines have been given. ? After blood has been drawn. ? As part of routine maintenance.  If a constant infusion is running, the port may not need to be flushed.  How long will my port stay implanted? The port can stay in for as long as your health care provider thinks it is needed. When it is time for the port to come out, surgery will be   done to remove it. The procedure is similar to the one performed when the port was put in. When should I seek immediate medical care? When you have an implanted port, you should seek immediate medical care if:  You notice a bad smell coming from the incision site.  You have swelling, redness, or drainage at the incision site.  You have more swelling or pain at the port site or the surrounding area.  You have a fever that is not controlled with medicine.  This information is not intended to replace advice given to you by your health care provider. Make sure you discuss any questions you have with your health care provider. Document  Released: 06/08/2005 Document Revised: 11/14/2015 Document Reviewed: 02/13/2013 Elsevier Interactive Patient Education  2017 [employer]  

## 2016-09-23 NOTE — Patient Instructions (Signed)
[facility] Discharge Instructions for Patients Receiving Chemotherapy  Today you received the following chemotherapy agents:  Avastin, Leucovorin, and 5FU.  To help prevent nausea and vomiting after your treatment, we encourage you to take your nausea medication as directed.   If you develop nausea and vomiting that is not controlled by your nausea medication, call the clinic.   BELOW ARE SYMPTOMS THAT SHOULD BE REPORTED IMMEDIATELY:  *FEVER GREATER THAN 100.5 F  *CHILLS WITH OR WITHOUT FEVER  NAUSEA AND VOMITING THAT IS NOT CONTROLLED WITH YOUR NAUSEA MEDICATION  *UNUSUAL SHORTNESS OF BREATH  *UNUSUAL BRUISING OR BLEEDING  TENDERNESS IN MOUTH AND THROAT WITH OR WITHOUT PRESENCE OF ULCERS  *URINARY PROBLEMS  *BOWEL PROBLEMS  UNUSUAL RASH Items with * indicate a potential emergency and should be followed up as soon as possible.  Feel free to call the clinic you have any questions or concerns. The clinic phone number is [phone].  Please show the [place] at check-in to the Emergency Department and triage nurse.

## 2016-09-23 NOTE — Telephone Encounter (Signed)
Appointments scheduled per 4.4.18 LOS. Patient given AVS report and calendars with future scheduled appointments. °

## 2016-09-23 NOTE — Progress Notes (Signed)
Hematology and Oncology Follow Up Visit  Margaret Elliott [id number] [birth year]    09/23/16   Principle Diagnosis: 66 year old woman diagnosed with colon cancer in June 2014. She had presented with an abdominal pain and found to have a cecal mass resulting in obstruction and appendicitis. She had Likely has stage IV disease with a liver mass.    Prior Therapy:  She is status post laparoscopic laparotomy, evacuation of a pelvic abscess and right hemicolectomy with ileocolonic anastomosis done on December 09, 2012. The pathology showed an invasive, well- differentiated colorectal adenocarcinoma. Tumor invades through the muscularis propria, with 2/18 lymph nodes involved, with the pathological staging T3 N1b disease. Her tumor was found to be microsatellite stable. KRAS wild type.   She is also status post Port-A-Cath insertion that was done on 01/05/2013.  FOLFOX chemotherapy started 01/18/2013. Avastin to be added with cycle 2 on 02/01/2013. She is S/P 12 cycles completed in 05/2013.   She is a status post microwave ablation of metastatic lesion in the posterior segment of the right lobe of the liver completed on 09/28/2014 and repeated on 11/30/2014.  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   Current therapy: FOLFIRI and a Avastin salvage therapy started on 12/10/2015. She has received 5-FU, leucovorin and a Avastin only starting with cycle 10.  She is here for the next cycle of therapy.  Interim History:  [name] presents today for a followup visit. Since her last visit, she reports no new side effects associated with chemotherapy. She did report mild nausea but no vomiting. She denied any diarrhea or infusion related complications. She denied any worsening neuropathy. She remains active and attends to activities of daily living. She is reporting more fatigue at times and require periodically. Her quality of life and not  dramatically changed at this time. She denied any early satiety or abdominal distention. Her appetite remains excellent and have gained more weight since last visit.  She is not report any headaches, blurry vision, syncope or seizures. She does not report any fevers, chills or sweats. She does not report any chest pain, palpitation or orthopnea. She does not report any wheezing, hemoptysis or hematemesis. She does not report any frequency urgency or hesitancy. Does not report any lymphadenopathy or petechiae. Rest of her review of systems unremarkable.   Medications: I have reviewed the patient's current medications.  Current Outpatient Prescriptions  Medication Sig Dispense Refill  . acetaminophen (TYLENOL) 500 MG tablet Take 500 mg by mouth every 6 (six) hours as needed for pain.      . diphenhydrAMINE (BENADRYL) 25 mg capsule Take 25-50 mg by mouth daily as needed for itching. For allergic reaction      . lidocaine-prilocaine (EMLA) cream Apply 1 application topically as needed. Apply approx 1/2 tsp to skin over port, prior to chemotherapy treatments  1 kit  3  . Multiple Vitamin (MULTIVITAMIN) tablet Take 1 tablet by mouth daily.      . ondansetron (ZOFRAN) 8 MG tablet Take 1 tablet (8 mg total) by mouth every 8 (eight) hours as needed for nausea.  30 tablet  1   No current facility-administered medications for this visit.     Allergies: No Known Allergies  Past Medical History, Surgical history, Social history, and Family History were reviewed and updated.    Physical Exam:  Blood pressure (!) 143/90, pulse 82, temperature 98.3 F (36.8 C), temperature source Oral, resp. rate 18,  height '5\' 4"'$  (1.626 m), weight 167 lb 9.6 oz (76 kg), SpO2 100 %. ECOG: 0 General appearance: Well-appearing woman appeared without distress. Head: No oral ulcers or lesions. Neck: no adenopathy.  Lymph nodes: Cervical, supraclavicular, and axillary nodes normal. Heart:regular rate and rhythm, S1, S2  normal, no murmur, rubs or gallops. Lung:chest clear, no wheezing, rales, normal symmetric air entry.  Chest wall examination: Revealed Port-A-Cath site without ecchymosis or drainage. Abdomen: soft, non-tender, without masses or nodularity noted. No shifting dullness or ascites. No rebound or guarding. Neurological: No new neurological deficits noted.  CBC    Component Value Date/Time   WBC 4.2 09/09/2016 0756   WBC 4.0 11/20/2015 0735   RBC 3.82 09/09/2016 0756   RBC 3.95 11/20/2015 0735   HGB 12.0 09/09/2016 0756   HCT 36.5 09/09/2016 0756   PLT 184 09/09/2016 0756   MCV 95.5 09/09/2016 0756   MCH 31.4 09/09/2016 0756   MCH 30.6 11/20/2015 0735   MCHC 32.9 09/09/2016 0756   MCHC 34.1 11/20/2015 0735   RDW 16.7 (H) 09/09/2016 0756   LYMPHSABS 1.4 09/09/2016 0756   MONOABS 0.4 09/09/2016 0756   EOSABS 0.1 09/09/2016 0756   BASOSABS 0.0 09/09/2016 0756     Results for Margaret Elliott, Margaret Elliott (MRN [id number]) as of 09/23/2016 09:01  Ref. Range 08/12/2016 07:52 08/26/2016 08:14 09/09/2016 07:56  CEA ([facility]) Latest Ref Range: 0.00 - 5.00 ng/mL 2.28 2.23 2.28     Impression and Plan:  66 year old woman with the following issues:   1. Advanced Colon cancer. She presented with a large right cecal mass causing obstruction and abscess and appendicitis. After surgical resection she received 12 cycles of chemotherapy with PET/CT scan on 09/12/2013 showed no residual disease actually in the liver that is hypermetabolic.  She is status post liver directed therapy for metastatic disease and to the liver stream and was given in June 2016.  Her PET/CT scan on 11/08/2015 showed progressive omental and peritoneal metastasis. Biopsy proven to be adenocarcinoma of the colon that is KRAS wild-type but NRAS mutated.  She is on salvage FOLFIRI and a Avastin that is well-tolerated. CPT-11 was removed for better tolerance after cycle 10.  CT scan obtain on 12/26/2017continues to show reasonable  response to therapy with decrease in the size of her omental metastasis. She declined primary surgical therapy that included omentectomy.  She is currently receiving maintenance chemotherapy with 5-FU any Avastin.   She continues to tolerate this therapy reasonably well and willing to continue at this time. The plan is to repeat imaging studies in June 2018. Her tumor marker indicate stable disease at this time. She will receive her chemotherapy today as scheduled of her CBC permits at this time.     2. Intravenous access. Port-A-Cath in use without any complications or irritation.  3. Neuropathy: Stable at this time without worsening. Prescription for hydrocodone is available to the patient and will be refilled periodically.  4. Antiemetics: Compazine is effective in managing her nausea. This will be refilled on previous occasions.  5. Neutropenia: Neulasta will be started at this time unless CPT-11 is added in the future.  6. Followup: Will be in 2 weeks for the next cycle of chemotherapy.   [facility] MD 09/23/16

## 2016-09-25 ENCOUNTER — Ambulatory Visit (HOSPITAL_BASED_OUTPATIENT_CLINIC_OR_DEPARTMENT_OTHER): Payer: Medicare Other

## 2016-09-25 VITALS — BP 145/94 | HR 69 | Temp 98.3°F | Resp 16

## 2016-09-25 DIAGNOSIS — K769 Liver disease, unspecified: Secondary | ICD-10-CM

## 2016-09-25 DIAGNOSIS — Z452 Encounter for adjustment and management of vascular access device: Secondary | ICD-10-CM | POA: Diagnosis not present

## 2016-09-25 DIAGNOSIS — C189 Malignant neoplasm of colon, unspecified: Secondary | ICD-10-CM

## 2016-09-25 DIAGNOSIS — C180 Malignant neoplasm of cecum: Secondary | ICD-10-CM

## 2016-09-25 DIAGNOSIS — K6389 Other specified diseases of intestine: Secondary | ICD-10-CM

## 2016-09-25 MED ORDER — SODIUM CHLORIDE 0.9% FLUSH
10.0000 mL | INTRAVENOUS | Status: DC | PRN
  Administered 2016-09-25: 10 mL
  Filled 2016-09-25: qty 10

## 2016-09-25 MED ORDER — HEPARIN SOD (PORK) LOCK FLUSH 100 UNIT/ML IV SOLN
500.0000 [IU] | Freq: Once | INTRAVENOUS | Status: AC | PRN
Start: 2016-09-25 — End: 2016-09-25
  Administered 2016-09-25: 500 [IU]
  Filled 2016-09-25: qty 5

## 2016-09-28 NOTE — Telephone Encounter (Signed)
error 

## 2016-10-07 ENCOUNTER — Telehealth: Payer: Self-pay | Admitting: Oncology

## 2016-10-07 ENCOUNTER — Ambulatory Visit: Payer: Medicare Other

## 2016-10-07 ENCOUNTER — Ambulatory Visit (HOSPITAL_BASED_OUTPATIENT_CLINIC_OR_DEPARTMENT_OTHER): Payer: Medicare Other

## 2016-10-07 ENCOUNTER — Ambulatory Visit (HOSPITAL_BASED_OUTPATIENT_CLINIC_OR_DEPARTMENT_OTHER): Payer: Medicare Other | Admitting: Oncology

## 2016-10-07 ENCOUNTER — Other Ambulatory Visit (HOSPITAL_BASED_OUTPATIENT_CLINIC_OR_DEPARTMENT_OTHER): Payer: Medicare Other

## 2016-10-07 VITALS — BP 142/86 | HR 87 | Temp 97.8°F | Resp 18 | Ht 64.0 in | Wt 167.4 lb

## 2016-10-07 VITALS — BP 132/76

## 2016-10-07 DIAGNOSIS — D709 Neutropenia, unspecified: Secondary | ICD-10-CM | POA: Diagnosis not present

## 2016-10-07 DIAGNOSIS — C189 Malignant neoplasm of colon, unspecified: Secondary | ICD-10-CM

## 2016-10-07 DIAGNOSIS — C787 Secondary malignant neoplasm of liver and intrahepatic bile duct: Secondary | ICD-10-CM

## 2016-10-07 DIAGNOSIS — K769 Liver disease, unspecified: Secondary | ICD-10-CM

## 2016-10-07 DIAGNOSIS — Z5111 Encounter for antineoplastic chemotherapy: Secondary | ICD-10-CM

## 2016-10-07 DIAGNOSIS — Z79899 Other long term (current) drug therapy: Secondary | ICD-10-CM | POA: Diagnosis not present

## 2016-10-07 DIAGNOSIS — C180 Malignant neoplasm of cecum: Secondary | ICD-10-CM

## 2016-10-07 DIAGNOSIS — Z5112 Encounter for antineoplastic immunotherapy: Secondary | ICD-10-CM | POA: Diagnosis present

## 2016-10-07 DIAGNOSIS — K6389 Other specified diseases of intestine: Secondary | ICD-10-CM

## 2016-10-07 DIAGNOSIS — Z95828 Presence of other vascular implants and grafts: Secondary | ICD-10-CM

## 2016-10-07 LAB — COMPREHENSIVE METABOLIC PANEL
ALK PHOS: 84 U/L (ref 40–150)
ALT: 12 U/L (ref 0–55)
AST: 20 U/L (ref 5–34)
Albumin: 3.4 g/dL — ABNORMAL LOW (ref 3.5–5.0)
Anion Gap: 10 mEq/L (ref 3–11)
BUN: 18.8 mg/dL (ref 7.0–26.0)
CHLORIDE: 109 meq/L (ref 98–109)
CO2: 23 mEq/L (ref 22–29)
Calcium: 9.3 mg/dL (ref 8.4–10.4)
Creatinine: 1.1 mg/dL (ref 0.6–1.1)
EGFR: 62 mL/min/{1.73_m2} — AB (ref >90)
Glucose: 111 mg/dl (ref 70–140)
POTASSIUM: 3.8 meq/L (ref 3.5–5.1)
Sodium: 141 mEq/L (ref 136–145)
Total Bilirubin: 0.79 mg/dL (ref 0.20–1.20)
Total Protein: 7.1 g/dL (ref 6.4–8.3)

## 2016-10-07 LAB — CBC WITH DIFFERENTIAL/PLATELET
BASO%: 0.4 % (ref 0.0–2.0)
BASOS ABS: 0 10*3/uL (ref 0.0–0.1)
EOS ABS: 0.1 10*3/uL (ref 0.0–0.5)
EOS%: 2.9 % (ref 0.0–7.0)
HCT: 35.9 % (ref 34.8–46.6)
HEMOGLOBIN: 11.9 g/dL (ref 11.6–15.9)
LYMPH%: 30.1 % (ref 14.0–49.7)
MCH: 32.7 pg (ref 25.1–34.0)
MCHC: 33.1 g/dL (ref 31.5–36.0)
MCV: 98.6 fL (ref 79.5–101.0)
MONO#: 0.4 10*3/uL (ref 0.1–0.9)
MONO%: 9.7 % (ref 0.0–14.0)
NEUT#: 2.6 10*3/uL (ref 1.5–6.5)
NEUT%: 56.9 % (ref 38.4–76.8)
Platelets: 186 10*3/uL (ref 145–400)
RBC: 3.64 10*6/uL — AB (ref 3.70–5.45)
RDW: 17.2 % — ABNORMAL HIGH (ref 11.2–14.5)
WBC: 4.6 10*3/uL (ref 3.9–10.3)
lymph#: 1.4 10*3/uL (ref 0.9–3.3)

## 2016-10-07 LAB — UA PROTEIN, DIPSTICK - CHCC: Protein, ur: NEGATIVE mg/dL

## 2016-10-07 LAB — CEA (IN HOUSE-CHCC): CEA (CHCC-In House): 2.62 ng/mL (ref 0.00–5.00)

## 2016-10-07 MED ORDER — LEUCOVORIN CALCIUM INJECTION 350 MG
400.0000 mg/m2 | Freq: Once | INTRAMUSCULAR | Status: AC
  Administered 2016-10-07: 752 mg via INTRAVENOUS
  Filled 2016-10-07: qty 37.6

## 2016-10-07 MED ORDER — SODIUM CHLORIDE 0.9 % IV SOLN
2400.0000 mg/m2 | INTRAVENOUS | Status: DC
  Administered 2016-10-07: 4500 mg via INTRAVENOUS
  Filled 2016-10-07: qty 90

## 2016-10-07 MED ORDER — PALONOSETRON HCL INJECTION 0.25 MG/5ML
INTRAVENOUS | Status: AC
  Filled 2016-10-07: qty 5

## 2016-10-07 MED ORDER — DEXAMETHASONE SODIUM PHOSPHATE 10 MG/ML IJ SOLN
INTRAMUSCULAR | Status: AC
  Filled 2016-10-07: qty 1

## 2016-10-07 MED ORDER — SODIUM CHLORIDE 0.9 % IV SOLN
Freq: Once | INTRAVENOUS | Status: AC
  Administered 2016-10-07: 09:00:00 via INTRAVENOUS

## 2016-10-07 MED ORDER — DEXAMETHASONE SODIUM PHOSPHATE 10 MG/ML IJ SOLN
10.0000 mg | Freq: Once | INTRAMUSCULAR | Status: AC
  Administered 2016-10-07: 10 mg via INTRAVENOUS

## 2016-10-07 MED ORDER — PALONOSETRON HCL INJECTION 0.25 MG/5ML
0.2500 mg | Freq: Once | INTRAVENOUS | Status: AC
  Administered 2016-10-07: 0.25 mg via INTRAVENOUS

## 2016-10-07 MED ORDER — SODIUM CHLORIDE 0.9 % IJ SOLN
10.0000 mL | INTRAMUSCULAR | Status: AC | PRN
  Administered 2016-10-07: 10 mL via INTRAVENOUS
  Filled 2016-10-07: qty 10

## 2016-10-07 MED ORDER — FLUOROURACIL CHEMO INJECTION 2.5 GM/50ML
400.0000 mg/m2 | Freq: Once | INTRAVENOUS | Status: AC
  Administered 2016-10-07: 750 mg via INTRAVENOUS
  Filled 2016-10-07: qty 15

## 2016-10-07 MED ORDER — SODIUM CHLORIDE 0.9 % IV SOLN
5.0000 mg/kg | Freq: Once | INTRAVENOUS | Status: AC
  Administered 2016-10-07: 400 mg via INTRAVENOUS
  Filled 2016-10-07: qty 16

## 2016-10-07 NOTE — Patient Instructions (Signed)
[facility] Discharge Instructions for Patients Receiving Chemotherapy  Today you received the following chemotherapy agents Avastin, Leucovorin and Adrucil  To help prevent nausea and vomiting after your treatment, we encourage you to take your nausea medication as directed. No Zofran for 3 days. Take Compazine instead.    If you develop nausea and vomiting that is not controlled by your nausea medication, call the clinic.   BELOW ARE SYMPTOMS THAT SHOULD BE REPORTED IMMEDIATELY:  *FEVER GREATER THAN 100.5 F  *CHILLS WITH OR WITHOUT FEVER  NAUSEA AND VOMITING THAT IS NOT CONTROLLED WITH YOUR NAUSEA MEDICATION  *UNUSUAL SHORTNESS OF BREATH  *UNUSUAL BRUISING OR BLEEDING  TENDERNESS IN MOUTH AND THROAT WITH OR WITHOUT PRESENCE OF ULCERS  *URINARY PROBLEMS  *BOWEL PROBLEMS  UNUSUAL RASH Items with * indicate a potential emergency and should be followed up as soon as possible.  Feel free to call the clinic you have any questions or concerns. The clinic phone number is [phone].  Please show the [facility] at check-in to the Emergency Department and triage nurse.

## 2016-10-07 NOTE — Telephone Encounter (Signed)
Appointments scheduled per 4.18.18 LOS. Patient given AVS report and calendars with future scheduled appointments. °

## 2016-10-07 NOTE — Patient Instructions (Signed)
Implanted Port Home Guide An implanted port is a type of central line that is placed under the skin. Central lines are used to provide IV access when treatment or nutrition needs to be given through a person's veins. Implanted ports are used for long-term IV access. An implanted port may be placed because:  You need IV medicine that would be irritating to the small veins in your hands or arms.  You need long-term IV medicines, such as antibiotics.  You need IV nutrition for a long period.  You need frequent blood draws for lab tests.  You need dialysis.  Implanted ports are usually placed in the chest area, but they can also be placed in the upper arm, the abdomen, or the leg. An implanted port has two main parts:  Reservoir. The reservoir is round and will appear as a small, raised area under your skin. The reservoir is the part where a needle is inserted to give medicines or draw blood.  Catheter. The catheter is a thin, flexible tube that extends from the reservoir. The catheter is placed into a large vein. Medicine that is inserted into the reservoir goes into the catheter and then into the vein.  How will I care for my incision site? Do not get the incision site wet. Bathe or shower as directed by your health care provider. How is my port accessed? Special steps must be taken to access the port:  Before the port is accessed, a numbing cream can be placed on the skin. This helps numb the skin over the port site.  Your health care provider uses a sterile technique to access the port. ? Your health care provider must put on a mask and sterile gloves. ? The skin over your port is cleaned carefully with an antiseptic and allowed to dry. ? The port is gently pinched between sterile gloves, and a needle is inserted into the port.  Only "non-coring" port needles should be used to access the port. Once the port is accessed, a blood return should be checked. This helps ensure that the port  is in the vein and is not clogged.  If your port needs to remain accessed for a constant infusion, a clear (transparent) bandage will be placed over the needle site. The bandage and needle will need to be changed every week, or as directed by your health care provider.  Keep the bandage covering the needle clean and dry. Do not get it wet. Follow your health care provider's instructions on how to take a shower or bath while the port is accessed.  If your port does not need to stay accessed, no bandage is needed over the port.  What is flushing? Flushing helps keep the port from getting clogged. Follow your health care provider's instructions on how and when to flush the port. Ports are usually flushed with saline solution or a medicine called heparin. The need for flushing will depend on how the port is used.  If the port is used for intermittent medicines or blood draws, the port will need to be flushed: ? After medicines have been given. ? After blood has been drawn. ? As part of routine maintenance.  If a constant infusion is running, the port may not need to be flushed.  How long will my port stay implanted? The port can stay in for as long as your health care provider thinks it is needed. When it is time for the port to come out, surgery will be   done to remove it. The procedure is similar to the one performed when the port was put in. When should I seek immediate medical care? When you have an implanted port, you should seek immediate medical care if:  You notice a bad smell coming from the incision site.  You have swelling, redness, or drainage at the incision site.  You have more swelling or pain at the port site or the surrounding area.  You have a fever that is not controlled with medicine.  This information is not intended to replace advice given to you by your health care provider. Make sure you discuss any questions you have with your health care provider. Document  Released: 06/08/2005 Document Revised: 11/14/2015 Document Reviewed: 02/13/2013 Elsevier Interactive Patient Education  2017 [employer]  

## 2016-10-07 NOTE — Progress Notes (Signed)
Hematology and Oncology Follow Up Visit  Margaret Elliott [id number] [birth year]    10/07/16   Principle Diagnosis: 66 year old woman diagnosed with colon cancer in June 2014. She had presented with an abdominal pain and found to have a cecal mass resulting in obstruction and appendicitis. She had Likely has stage IV disease with a liver mass.    Prior Therapy:  She is status post laparoscopic laparotomy, evacuation of a pelvic abscess and right hemicolectomy with ileocolonic anastomosis done on December 09, 2012. The pathology showed an invasive, well- differentiated colorectal adenocarcinoma. Tumor invades through the muscularis propria, with 2/18 lymph nodes involved, with the pathological staging T3 N1b disease. Her tumor was found to be microsatellite stable. KRAS wild type.   She is also status post Port-A-Cath insertion that was done on 01/05/2013.  FOLFOX chemotherapy started 01/18/2013. Avastin to be added with cycle 2 on 02/01/2013. She is S/P 12 cycles completed in 05/2013.   She is a status post microwave ablation of metastatic lesion in the posterior segment of the right lobe of the liver completed on 09/28/2014 and repeated on 11/30/2014.  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   Current therapy: FOLFIRI and a Avastin salvage therapy started on 12/10/2015. She has received 5-FU, leucovorin and a Avastin only starting with cycle 10.  She is here for the next cycle of therapy.  Interim History:  Margaret Elliott presents today for a followup visit. Since her last visit, she continues to feel about the same without any changes. She tolerates chemotherapy reasonably well without any major changes in her health. She does report mild nausea and fatigue after the pump discontinuation and some mild diarrhea. The symptoms resolved spontaneously. She denied any worsening neuropathy. She denied any early satiety or abdominal  distention. Her appetite remains stable without any changes in her weight.  She is not report any headaches, blurry vision, syncope or seizures. She does not report any fevers, chills or sweats. She does not report any chest pain, palpitation or orthopnea. She does not report any wheezing, hemoptysis or hematemesis. She does not report any frequency urgency or hesitancy. Does not report any lymphadenopathy or petechiae. Rest of her review of systems unremarkable.   Medications: I have reviewed the patient's current medications.  Current Outpatient Prescriptions  Medication Sig Dispense Refill  . acetaminophen (TYLENOL) 500 MG tablet Take 500 mg by mouth every 6 (six) hours as needed for pain.      . diphenhydrAMINE (BENADRYL) 25 mg capsule Take 25-50 mg by mouth daily as needed for itching. For allergic reaction      . lidocaine-prilocaine (EMLA) cream Apply 1 application topically as needed. Apply approx 1/2 tsp to skin over port, prior to chemotherapy treatments  1 kit  3  . Multiple Vitamin (MULTIVITAMIN) tablet Take 1 tablet by mouth daily.      . ondansetron (ZOFRAN) 8 MG tablet Take 1 tablet (8 mg total) by mouth every 8 (eight) hours as needed for nausea.  30 tablet  1   No current facility-administered medications for this visit.     Allergies: No Known Allergies  Past Medical History, Surgical history, Social history, and Family History were reviewed and updated.    Physical Exam:  Blood pressure (!) 142/86, pulse 87, temperature 97.8 F (36.6 C), temperature source Oral, resp. rate 18, height _0  (1.626 m), weight 167 lb 6.4 oz (75.9 kg), SpO2 100 %. ECOG: 0  General appearance: Alert, awake well and without distress. Head: No oral ulcers or lesions. Neck: no adenopathy.  Lymph nodes: Cervical, supraclavicular, and axillary nodes normal. Heart:regular rate and rhythm, S1, S2 normal, no murmur, rubs or gallops. Lung:chest clear, no wheezing, rales, normal symmetric air  entry.  Chest wall examination: Revealed Port-A-Cath site without ecchymosis or drainage. Abdomen: soft, non-tender, without masses or nodularity noted. No sscites or rebound. Neurological: No new neurological deficits noted.  CBC    Component Value Date/Time   WBC 4.6 10/07/2016 0815   WBC 4.0 11/20/2015 0735   RBC 3.64 (L) 10/07/2016 0815   RBC 3.95 11/20/2015 0735   HGB 11.9 10/07/2016 0815   HCT 35.9 10/07/2016 0815   PLT 186 10/07/2016 0815   MCV 98.6 10/07/2016 0815   MCH 32.7 10/07/2016 0815   MCH 30.6 11/20/2015 0735   MCHC 33.1 10/07/2016 0815   MCHC 34.1 11/20/2015 0735   RDW 17.2 (H) 10/07/2016 0815   LYMPHSABS 1.4 10/07/2016 0815   MONOABS 0.4 10/07/2016 0815   EOSABS 0.1 10/07/2016 0815   BASOSABS 0.0 10/07/2016 0815     Results for Margaret Elliott, Margaret Elliott (MRN [id number]) as of 10/07/2016 08:36  Ref. Range 08/26/2016 08:14 09/09/2016 07:56 09/23/2016 08:37  CEA ([facility]) Latest Ref Range: 0.00 - 5.00 ng/mL 2.23 2.28 2.25     Impression and Plan:  66 year old woman with the following issues:   1. Advanced Colon cancer. She presented with a large right cecal mass causing obstruction and abscess and appendicitis. After surgical resection she received 12 cycles of chemotherapy with PET/CT scan on 09/12/2013 showed no residual disease actually in the liver that is hypermetabolic.  She is status post liver directed therapy for metastatic disease and to the liver stream and was given in June 2016.  Her PET/CT scan on 11/08/2015 showed progressive omental and peritoneal metastasis. Biopsy proven to be adenocarcinoma of the colon that is KRAS wild-type but NRAS mutated.  She is on salvage FOLFIRI and a Avastin that is well-tolerated. CPT-11 was removed for better tolerance after cycle 10.  CT scan obtain on 12/26/2017continues to show reasonable response to therapy with decrease in the size of her omental metastasis. She declined primary surgical therapy that included  omentectomy.  She is currently receiving maintenance chemotherapy with 5-FU any Avastin. Her disease status remains stable with a CEA unchanged. The plan is to repeat imaging studies in June 2018.  Risks and benefits of continuing maintenance chemotherapy were reviewed today and she is agreeable to continue.   2. Intravenous access. Port-A-Cath in use without any complications.  3. Neuropathy: Unchanged from previous examination. She is able to function properly.  4. Antiemetics: Compazine is effective in managing her nausea. No recent exacerbation.  5. Neutropenia: Neulasta will be started at this time unless CPT-11 is added in the future.  6. Followup: Will be in 2 weeks for the next cycle of chemotherapy.   [facility] MD 10/07/16

## 2016-10-09 ENCOUNTER — Ambulatory Visit (HOSPITAL_BASED_OUTPATIENT_CLINIC_OR_DEPARTMENT_OTHER): Payer: Medicare Other

## 2016-10-09 VITALS — BP 154/82 | HR 71 | Temp 98.1°F | Resp 18

## 2016-10-09 DIAGNOSIS — Z452 Encounter for adjustment and management of vascular access device: Secondary | ICD-10-CM | POA: Diagnosis present

## 2016-10-09 DIAGNOSIS — C180 Malignant neoplasm of cecum: Secondary | ICD-10-CM | POA: Diagnosis not present

## 2016-10-09 DIAGNOSIS — Z95828 Presence of other vascular implants and grafts: Secondary | ICD-10-CM

## 2016-10-09 MED ORDER — SODIUM CHLORIDE 0.9 % IJ SOLN
10.0000 mL | INTRAMUSCULAR | Status: DC | PRN
  Administered 2016-10-09: 10 mL via INTRAVENOUS
  Filled 2016-10-09: qty 10

## 2016-10-09 MED ORDER — HEPARIN SOD (PORK) LOCK FLUSH 100 UNIT/ML IV SOLN
500.0000 [IU] | Freq: Once | INTRAVENOUS | Status: AC | PRN
  Administered 2016-10-09: 500 [IU] via INTRAVENOUS
  Filled 2016-10-09: qty 5

## 2016-10-21 ENCOUNTER — Ambulatory Visit (HOSPITAL_BASED_OUTPATIENT_CLINIC_OR_DEPARTMENT_OTHER): Payer: Medicare Other

## 2016-10-21 ENCOUNTER — Other Ambulatory Visit (HOSPITAL_BASED_OUTPATIENT_CLINIC_OR_DEPARTMENT_OTHER): Payer: Medicare Other

## 2016-10-21 ENCOUNTER — Ambulatory Visit: Payer: Medicare Other

## 2016-10-21 ENCOUNTER — Telehealth: Payer: Self-pay | Admitting: Oncology

## 2016-10-21 ENCOUNTER — Ambulatory Visit (HOSPITAL_BASED_OUTPATIENT_CLINIC_OR_DEPARTMENT_OTHER): Payer: Medicare Other | Admitting: Oncology

## 2016-10-21 VITALS — BP 151/94 | HR 77 | Temp 98.4°F | Resp 18 | Wt 168.7 lb

## 2016-10-21 VITALS — BP 139/82 | HR 72

## 2016-10-21 DIAGNOSIS — G609 Hereditary and idiopathic neuropathy, unspecified: Secondary | ICD-10-CM

## 2016-10-21 DIAGNOSIS — C180 Malignant neoplasm of cecum: Secondary | ICD-10-CM

## 2016-10-21 DIAGNOSIS — K6389 Other specified diseases of intestine: Secondary | ICD-10-CM

## 2016-10-21 DIAGNOSIS — C189 Malignant neoplasm of colon, unspecified: Secondary | ICD-10-CM

## 2016-10-21 DIAGNOSIS — C787 Secondary malignant neoplasm of liver and intrahepatic bile duct: Secondary | ICD-10-CM | POA: Diagnosis not present

## 2016-10-21 DIAGNOSIS — K769 Liver disease, unspecified: Secondary | ICD-10-CM

## 2016-10-21 DIAGNOSIS — Z95828 Presence of other vascular implants and grafts: Secondary | ICD-10-CM

## 2016-10-21 DIAGNOSIS — Z5112 Encounter for antineoplastic immunotherapy: Secondary | ICD-10-CM

## 2016-10-21 DIAGNOSIS — Z5111 Encounter for antineoplastic chemotherapy: Secondary | ICD-10-CM | POA: Diagnosis not present

## 2016-10-21 DIAGNOSIS — D709 Neutropenia, unspecified: Secondary | ICD-10-CM

## 2016-10-21 LAB — CBC WITH DIFFERENTIAL/PLATELET
BASO%: 0.5 % (ref 0.0–2.0)
Basophils Absolute: 0 10*3/uL (ref 0.0–0.1)
EOS ABS: 0.1 10*3/uL (ref 0.0–0.5)
EOS%: 2.4 % (ref 0.0–7.0)
HEMATOCRIT: 36.5 % (ref 34.8–46.6)
HEMOGLOBIN: 12.4 g/dL (ref 11.6–15.9)
LYMPH#: 1.4 10*3/uL (ref 0.9–3.3)
LYMPH%: 28.9 % (ref 14.0–49.7)
MCH: 33.8 pg (ref 25.1–34.0)
MCHC: 33.9 g/dL (ref 31.5–36.0)
MCV: 99.7 fL (ref 79.5–101.0)
MONO#: 0.5 10*3/uL (ref 0.1–0.9)
MONO%: 10.6 % (ref 0.0–14.0)
NEUT#: 2.8 10*3/uL (ref 1.5–6.5)
NEUT%: 57.6 % (ref 38.4–76.8)
Platelets: 202 10*3/uL (ref 145–400)
RBC: 3.67 10*6/uL — ABNORMAL LOW (ref 3.70–5.45)
RDW: 18.5 % — AB (ref 11.2–14.5)
WBC: 4.8 10*3/uL (ref 3.9–10.3)

## 2016-10-21 LAB — COMPREHENSIVE METABOLIC PANEL
ALBUMIN: 3.6 g/dL (ref 3.5–5.0)
ALK PHOS: 86 U/L (ref 40–150)
ALT: 12 U/L (ref 0–55)
ANION GAP: 9 meq/L (ref 3–11)
AST: 16 U/L (ref 5–34)
BILIRUBIN TOTAL: 0.76 mg/dL (ref 0.20–1.20)
BUN: 18.7 mg/dL (ref 7.0–26.0)
CALCIUM: 9.5 mg/dL (ref 8.4–10.4)
CO2: 24 mEq/L (ref 22–29)
Chloride: 108 mEq/L (ref 98–109)
Creatinine: 1 mg/dL (ref 0.6–1.1)
EGFR: 67 mL/min/{1.73_m2} — AB (ref >90)
GLUCOSE: 87 mg/dL (ref 70–140)
Potassium: 4.1 mEq/L (ref 3.5–5.1)
Sodium: 140 mEq/L (ref 136–145)
Total Protein: 7.3 g/dL (ref 6.4–8.3)

## 2016-10-21 LAB — CEA (IN HOUSE-CHCC): CEA (CHCC-In House): 2.45 ng/mL (ref 0.00–5.00)

## 2016-10-21 MED ORDER — FLUOROURACIL CHEMO INJECTION 2.5 GM/50ML
400.0000 mg/m2 | Freq: Once | INTRAVENOUS | Status: AC
  Administered 2016-10-21: 750 mg via INTRAVENOUS
  Filled 2016-10-21: qty 15

## 2016-10-21 MED ORDER — DEXAMETHASONE SODIUM PHOSPHATE 10 MG/ML IJ SOLN
INTRAMUSCULAR | Status: AC
  Filled 2016-10-21: qty 1

## 2016-10-21 MED ORDER — PALONOSETRON HCL INJECTION 0.25 MG/5ML
INTRAVENOUS | Status: AC
  Filled 2016-10-21: qty 5

## 2016-10-21 MED ORDER — PALONOSETRON HCL INJECTION 0.25 MG/5ML
0.2500 mg | Freq: Once | INTRAVENOUS | Status: AC
  Administered 2016-10-21: 0.25 mg via INTRAVENOUS

## 2016-10-21 MED ORDER — SODIUM CHLORIDE 0.9 % IV SOLN
Freq: Once | INTRAVENOUS | Status: AC
  Administered 2016-10-21: 10:00:00 via INTRAVENOUS

## 2016-10-21 MED ORDER — SODIUM CHLORIDE 0.9 % IV SOLN
5.0000 mg/kg | Freq: Once | INTRAVENOUS | Status: AC
  Administered 2016-10-21: 400 mg via INTRAVENOUS
  Filled 2016-10-21: qty 16

## 2016-10-21 MED ORDER — SODIUM CHLORIDE 0.9 % IJ SOLN
10.0000 mL | INTRAMUSCULAR | Status: DC | PRN
  Administered 2016-10-21: 10 mL via INTRAVENOUS
  Filled 2016-10-21: qty 10

## 2016-10-21 MED ORDER — LEUCOVORIN CALCIUM INJECTION 350 MG
400.0000 mg/m2 | Freq: Once | INTRAVENOUS | Status: AC
  Administered 2016-10-21: 752 mg via INTRAVENOUS
  Filled 2016-10-21: qty 37.6

## 2016-10-21 MED ORDER — DEXAMETHASONE SODIUM PHOSPHATE 10 MG/ML IJ SOLN
10.0000 mg | Freq: Once | INTRAMUSCULAR | Status: AC
  Administered 2016-10-21: 10 mg via INTRAVENOUS

## 2016-10-21 MED ORDER — SODIUM CHLORIDE 0.9 % IV SOLN
2400.0000 mg/m2 | INTRAVENOUS | Status: DC
  Administered 2016-10-21: 4500 mg via INTRAVENOUS
  Filled 2016-10-21: qty 90

## 2016-10-21 NOTE — Progress Notes (Signed)
Hematology and Oncology Follow Up Visit  Margaret Elliott [id number] 06-02-1951    10/21/16   Principle Diagnosis: 66 year old woman diagnosed with colon cancer in June 2014. She had presented with an abdominal pain and found to have a cecal mass resulting in obstruction and appendicitis. She had Likely has stage IV disease with a liver mass.    Prior Therapy:  She is status post laparoscopic laparotomy, evacuation of a pelvic abscess and right hemicolectomy with ileocolonic anastomosis done on December 09, 2012. The pathology showed an invasive, well- differentiated colorectal adenocarcinoma. Tumor invades through the muscularis propria, with 2/18 lymph nodes involved, with the pathological staging T3 N1b disease. Her tumor was found to be microsatellite stable. KRAS wild type.   She is also status post Port-A-Cath insertion that was done on 01/05/2013.  FOLFOX chemotherapy started 01/18/2013. Avastin to be added with cycle 2 on 02/01/2013. She is S/P 12 cycles completed in 05/2013.   She is a status post microwave ablation of metastatic lesion in the posterior segment of the right lobe of the liver completed on 09/28/2014 and repeated on 11/30/2014.  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   Current therapy: FOLFIRI and a Avastin salvage therapy started on 12/10/2015. She has received 5-FU, leucovorin and a Avastin only starting with cycle 10.  She is here for the next cycle of therapy.  Interim History:  Margaret Elliott presents today for a followup visit. Since her last visit, she reports doing reasonably well without any complaints. She remains in excellent health and performance status remains stable. She tolerated chemotherapy reasonably well without any major changes in her health. She does report mild nausea without vomiting and reasonably managed with antiemetics. The symptoms resolved spontaneously. She denied any  worsening neuropathy. She denied any early satiety or abdominal distention. Her appetite is excellent and weight is stable.  She is not report any headaches, blurry vision, syncope or seizures. She does not report any fevers, chills or sweats. She does not report any chest pain, palpitation or orthopnea. She does not report any wheezing, hemoptysis or hematemesis. She does not report any frequency urgency or hesitancy. Does not report any lymphadenopathy or petechiae. Rest of her review of systems unremarkable.   Medications: I have reviewed the patient's current medications.  Current Outpatient Prescriptions  Medication Sig Dispense Refill  . acetaminophen (TYLENOL) 500 MG tablet Take 500 mg by mouth every 6 (six) hours as needed for pain.      . diphenhydrAMINE (BENADRYL) 25 mg capsule Take 25-50 mg by mouth daily as needed for itching. For allergic reaction      . lidocaine-prilocaine (EMLA) cream Apply 1 application topically as needed. Apply approx 1/2 tsp to skin over port, prior to chemotherapy treatments  1 kit  3  . Multiple Vitamin (MULTIVITAMIN) tablet Take 1 tablet by mouth daily.      . ondansetron (ZOFRAN) 8 MG tablet Take 1 tablet (8 mg total) by mouth every 8 (eight) hours as needed for nausea.  30 tablet  1   No current facility-administered medications for this visit.     Allergies: No Known Allergies  Past Medical History, Surgical history, Social history, and Family History were reviewed and updated.    Physical Exam:  Blood pressure (!) 151/94, pulse 77, temperature 98.4 F (36.9 C), temperature source Oral, resp. rate 18, weight 168 lb 11.2 oz (76.5 kg), SpO2 99 %. ECOG: 0 General appearance:  Well-appearing woman without distress. Head: No oral ulcers or lesions. Neck: no adenopathy.  Lymph nodes: Cervical, supraclavicular, and axillary nodes normal. Heart:regular rate and rhythm, S1, S2 normal, no murmur, rubs or gallops. Lung:chest clear, no wheezing, rales,  normal symmetric air entry.  Chest wall examination: Revealed Port-A-Cath site without ecchymosis or drainage. Abdomen: soft, non-tender, without masses or nodularity noted. No rebound or guarding. Neurological: No new neurological deficits noted.  CBC    Component Value Date/Time   WBC 4.8 10/21/2016 0815   WBC 4.0 11/20/2015 0735   RBC 3.67 (L) 10/21/2016 0815   RBC 3.95 11/20/2015 0735   HGB 12.4 10/21/2016 0815   HCT 36.5 10/21/2016 0815   PLT 202 10/21/2016 0815   MCV 99.7 10/21/2016 0815   MCH 33.8 10/21/2016 0815   MCH 30.6 11/20/2015 0735   MCHC 33.9 10/21/2016 0815   MCHC 34.1 11/20/2015 0735   RDW 18.5 (H) 10/21/2016 0815   LYMPHSABS 1.4 10/21/2016 0815   MONOABS 0.5 10/21/2016 0815   EOSABS 0.1 10/21/2016 0815   BASOSABS 0.0 10/21/2016 0815    Results for Margaret Elliott, Margaret Elliott (MRN [id number]) as of 10/21/2016 08:49  Ref. Range 09/09/2016 07:56 09/23/2016 08:37 10/07/2016 08:15  CEA ([facility]) Latest Ref Range: 0.00 - 5.00 ng/mL 2.28 2.25 2.62       Impression and Plan:  66 year old woman with the following issues:   1. Advanced Colon cancer. She presented with a large right cecal mass causing obstruction and abscess and appendicitis. After surgical resection she received 12 cycles of chemotherapy with PET/CT scan on 09/12/2013 showed no residual disease actually in the liver that is hypermetabolic.  She is status post liver directed therapy for metastatic disease and to the liver stream and was given in June 2016.  Her PET/CT scan on 11/08/2015 showed progressive omental and peritoneal metastasis. Biopsy proven to be adenocarcinoma of the colon that is KRAS wild-type but NRAS mutated.  She is on salvage FOLFIRI and a Avastin that is well-tolerated. CPT-11 was removed for better tolerance after cycle 10.  CT scan obtain on 12/26/2017continues to show reasonable response to therapy with decrease in the size of her omental metastasis. She declined primary surgical  therapy that included omentectomy.  She is currently receiving maintenance chemotherapy with 5-FU any Avastin.   Risks and benefits of continuing this regimen were discussed today and she is agreeable to continue. Her disease status has been stable and the plan is to repeat imaging studies in June 2018.  2. Intravenous access. Port-A-Cath in place without complications.  3. Neuropathy: Unchanged from previous examination. Not interfering with her ability to function.  4. Antiemetics: Compazine is effective in managing her nausea. No recent exacerbation.  5. Neutropenia: Neulasta will be started at this time unless CPT-11 is added in the future.  6. Followup: Will be in 2 weeks for the next cycle of chemotherapy.   [facility] MD 10/21/16

## 2016-10-21 NOTE — Patient Instructions (Signed)
[facility] Discharge Instructions for Patients Receiving Chemotherapy  Today you received the following chemotherapy agents: Leucovorin, Avastin, Adrucil.   To help prevent nausea and vomiting after your treatment, we encourage you to take your nausea medication as prescribed.   If you develop nausea and vomiting that is not controlled by your nausea medication, call the clinic.   BELOW ARE SYMPTOMS THAT SHOULD BE REPORTED IMMEDIATELY:  *FEVER GREATER THAN 100.5 F  *CHILLS WITH OR WITHOUT FEVER  NAUSEA AND VOMITING THAT IS NOT CONTROLLED WITH YOUR NAUSEA MEDICATION  *UNUSUAL SHORTNESS OF BREATH  *UNUSUAL BRUISING OR BLEEDING  TENDERNESS IN MOUTH AND THROAT WITH OR WITHOUT PRESENCE OF ULCERS  *URINARY PROBLEMS  *BOWEL PROBLEMS  UNUSUAL RASH Items with * indicate a potential emergency and should be followed up as soon as possible.  Feel free to call the clinic you have any questions or concerns. The clinic phone number is [phone].  Please show the [place] at check-in to the Emergency Department and triage nurse.

## 2016-10-21 NOTE — Telephone Encounter (Signed)
Appointments scheduled per 10/21/16 los. Patient was given a copy of the AVS report and appointment schedule, per 05/02/017 los.

## 2016-10-21 NOTE — Patient Instructions (Signed)
Implanted Port Home Guide An implanted port is a type of central line that is placed under the skin. Central lines are used to provide IV access when treatment or nutrition needs to be given through a person's veins. Implanted ports are used for long-term IV access. An implanted port may be placed because:  You need IV medicine that would be irritating to the small veins in your hands or arms.  You need long-term IV medicines, such as antibiotics.  You need IV nutrition for a long period.  You need frequent blood draws for lab tests.  You need dialysis.  Implanted ports are usually placed in the chest area, but they can also be placed in the upper arm, the abdomen, or the leg. An implanted port has two main parts:  Reservoir. The reservoir is round and will appear as a small, raised area under your skin. The reservoir is the part where a needle is inserted to give medicines or draw blood.  Catheter. The catheter is a thin, flexible tube that extends from the reservoir. The catheter is placed into a large vein. Medicine that is inserted into the reservoir goes into the catheter and then into the vein.  How will I care for my incision site? Do not get the incision site wet. Bathe or shower as directed by your health care provider. How is my port accessed? Special steps must be taken to access the port:  Before the port is accessed, a numbing cream can be placed on the skin. This helps numb the skin over the port site.  Your health care provider uses a sterile technique to access the port. ? Your health care provider must put on a mask and sterile gloves. ? The skin over your port is cleaned carefully with an antiseptic and allowed to dry. ? The port is gently pinched between sterile gloves, and a needle is inserted into the port.  Only "non-coring" port needles should be used to access the port. Once the port is accessed, a blood return should be checked. This helps ensure that the port  is in the vein and is not clogged.  If your port needs to remain accessed for a constant infusion, a clear (transparent) bandage will be placed over the needle site. The bandage and needle will need to be changed every week, or as directed by your health care provider.  Keep the bandage covering the needle clean and dry. Do not get it wet. Follow your health care provider's instructions on how to take a shower or bath while the port is accessed.  If your port does not need to stay accessed, no bandage is needed over the port.  What is flushing? Flushing helps keep the port from getting clogged. Follow your health care provider's instructions on how and when to flush the port. Ports are usually flushed with saline solution or a medicine called heparin. The need for flushing will depend on how the port is used.  If the port is used for intermittent medicines or blood draws, the port will need to be flushed: ? After medicines have been given. ? After blood has been drawn. ? As part of routine maintenance.  If a constant infusion is running, the port may not need to be flushed.  How long will my port stay implanted? The port can stay in for as long as your health care provider thinks it is needed. When it is time for the port to come out, surgery will be   done to remove it. The procedure is similar to the one performed when the port was put in. When should I seek immediate medical care? When you have an implanted port, you should seek immediate medical care if:  You notice a bad smell coming from the incision site.  You have swelling, redness, or drainage at the incision site.  You have more swelling or pain at the port site or the surrounding area.  You have a fever that is not controlled with medicine.  This information is not intended to replace advice given to you by your health care provider. Make sure you discuss any questions you have with your health care provider. Document  Released: 06/08/2005 Document Revised: 11/14/2015 Document Reviewed: 02/13/2013 Elsevier Interactive Patient Education  2017 [employer]  

## 2016-10-23 ENCOUNTER — Ambulatory Visit (HOSPITAL_BASED_OUTPATIENT_CLINIC_OR_DEPARTMENT_OTHER): Payer: Medicare Other

## 2016-10-23 VITALS — BP 129/87 | HR 67 | Temp 98.1°F | Resp 16

## 2016-10-23 DIAGNOSIS — C189 Malignant neoplasm of colon, unspecified: Secondary | ICD-10-CM

## 2016-10-23 DIAGNOSIS — C180 Malignant neoplasm of cecum: Secondary | ICD-10-CM

## 2016-10-23 DIAGNOSIS — Z452 Encounter for adjustment and management of vascular access device: Secondary | ICD-10-CM | POA: Diagnosis not present

## 2016-10-23 DIAGNOSIS — K769 Liver disease, unspecified: Secondary | ICD-10-CM

## 2016-10-23 DIAGNOSIS — K6389 Other specified diseases of intestine: Secondary | ICD-10-CM

## 2016-10-23 MED ORDER — HEPARIN SOD (PORK) LOCK FLUSH 100 UNIT/ML IV SOLN
500.0000 [IU] | Freq: Once | INTRAVENOUS | Status: AC | PRN
  Administered 2016-10-23: 500 [IU]
  Filled 2016-10-23: qty 5

## 2016-10-23 MED ORDER — SODIUM CHLORIDE 0.9% FLUSH
10.0000 mL | INTRAVENOUS | Status: DC | PRN
  Administered 2016-10-23: 10 mL
  Filled 2016-10-23: qty 10

## 2016-11-04 ENCOUNTER — Ambulatory Visit (HOSPITAL_BASED_OUTPATIENT_CLINIC_OR_DEPARTMENT_OTHER): Payer: Medicare Other | Admitting: Oncology

## 2016-11-04 ENCOUNTER — Telehealth: Payer: Self-pay | Admitting: Oncology

## 2016-11-04 ENCOUNTER — Other Ambulatory Visit (HOSPITAL_BASED_OUTPATIENT_CLINIC_OR_DEPARTMENT_OTHER): Payer: Medicare Other

## 2016-11-04 ENCOUNTER — Ambulatory Visit (HOSPITAL_BASED_OUTPATIENT_CLINIC_OR_DEPARTMENT_OTHER): Payer: Medicare Other

## 2016-11-04 ENCOUNTER — Ambulatory Visit: Payer: Medicare Other

## 2016-11-04 VITALS — BP 145/89 | HR 85 | Temp 97.8°F | Resp 18 | Ht 64.0 in | Wt 167.0 lb

## 2016-11-04 VITALS — BP 144/83 | HR 68

## 2016-11-04 DIAGNOSIS — C787 Secondary malignant neoplasm of liver and intrahepatic bile duct: Secondary | ICD-10-CM | POA: Diagnosis not present

## 2016-11-04 DIAGNOSIS — K769 Liver disease, unspecified: Secondary | ICD-10-CM

## 2016-11-04 DIAGNOSIS — Z5112 Encounter for antineoplastic immunotherapy: Secondary | ICD-10-CM

## 2016-11-04 DIAGNOSIS — C180 Malignant neoplasm of cecum: Secondary | ICD-10-CM

## 2016-11-04 DIAGNOSIS — Z95828 Presence of other vascular implants and grafts: Secondary | ICD-10-CM

## 2016-11-04 DIAGNOSIS — C189 Malignant neoplasm of colon, unspecified: Secondary | ICD-10-CM

## 2016-11-04 DIAGNOSIS — C786 Secondary malignant neoplasm of retroperitoneum and peritoneum: Secondary | ICD-10-CM | POA: Diagnosis not present

## 2016-11-04 DIAGNOSIS — K6389 Other specified diseases of intestine: Secondary | ICD-10-CM

## 2016-11-04 DIAGNOSIS — Z5111 Encounter for antineoplastic chemotherapy: Secondary | ICD-10-CM

## 2016-11-04 DIAGNOSIS — R040 Epistaxis: Secondary | ICD-10-CM

## 2016-11-04 LAB — COMPREHENSIVE METABOLIC PANEL
ALBUMIN: 3.5 g/dL (ref 3.5–5.0)
ALT: 13 U/L (ref 0–55)
ANION GAP: 8 meq/L (ref 3–11)
AST: 16 U/L (ref 5–34)
Alkaline Phosphatase: 69 U/L (ref 40–150)
BUN: 17.7 mg/dL (ref 7.0–26.0)
CO2: 24 mEq/L (ref 22–29)
CREATININE: 1.1 mg/dL (ref 0.6–1.1)
Calcium: 9.8 mg/dL (ref 8.4–10.4)
Chloride: 109 mEq/L (ref 98–109)
EGFR: 58 mL/min/{1.73_m2} — ABNORMAL LOW (ref >90)
Glucose: 98 mg/dl (ref 70–140)
POTASSIUM: 4 meq/L (ref 3.5–5.1)
SODIUM: 140 meq/L (ref 136–145)
Total Bilirubin: 0.72 mg/dL (ref 0.20–1.20)
Total Protein: 7.2 g/dL (ref 6.4–8.3)

## 2016-11-04 LAB — CBC WITH DIFFERENTIAL/PLATELET
BASO%: 0.5 % (ref 0.0–2.0)
BASOS ABS: 0 10*3/uL (ref 0.0–0.1)
EOS ABS: 0.1 10*3/uL (ref 0.0–0.5)
EOS%: 2.4 % (ref 0.0–7.0)
HCT: 36.3 % (ref 34.8–46.6)
HGB: 12.4 g/dL (ref 11.6–15.9)
LYMPH%: 31.4 % (ref 14.0–49.7)
MCH: 34.2 pg — AB (ref 25.1–34.0)
MCHC: 34.2 g/dL (ref 31.5–36.0)
MCV: 100.2 fL (ref 79.5–101.0)
MONO#: 0.5 10*3/uL (ref 0.1–0.9)
MONO%: 11 % (ref 0.0–14.0)
NEUT#: 2.2 10*3/uL (ref 1.5–6.5)
NEUT%: 54.7 % (ref 38.4–76.8)
PLATELETS: 190 10*3/uL (ref 145–400)
RBC: 3.62 10*6/uL — AB (ref 3.70–5.45)
RDW: 18 % — ABNORMAL HIGH (ref 11.2–14.5)
WBC: 4.1 10*3/uL (ref 3.9–10.3)
lymph#: 1.3 10*3/uL (ref 0.9–3.3)

## 2016-11-04 LAB — CEA (IN HOUSE-CHCC): CEA (CHCC-IN HOUSE): 2.67 ng/mL (ref 0.00–5.00)

## 2016-11-04 MED ORDER — HEPARIN SOD (PORK) LOCK FLUSH 100 UNIT/ML IV SOLN
500.0000 [IU] | Freq: Once | INTRAVENOUS | Status: DC | PRN
  Filled 2016-11-04: qty 5

## 2016-11-04 MED ORDER — PALONOSETRON HCL INJECTION 0.25 MG/5ML
0.2500 mg | Freq: Once | INTRAVENOUS | Status: AC
  Administered 2016-11-04: 0.25 mg via INTRAVENOUS

## 2016-11-04 MED ORDER — DEXAMETHASONE SODIUM PHOSPHATE 10 MG/ML IJ SOLN
10.0000 mg | Freq: Once | INTRAMUSCULAR | Status: AC
  Administered 2016-11-04: 10 mg via INTRAVENOUS

## 2016-11-04 MED ORDER — PALONOSETRON HCL INJECTION 0.25 MG/5ML
INTRAVENOUS | Status: AC
  Filled 2016-11-04: qty 5

## 2016-11-04 MED ORDER — FLUOROURACIL CHEMO INJECTION 5 GM/100ML
2400.0000 mg/m2 | INTRAVENOUS | Status: DC
  Administered 2016-11-04: 4500 mg via INTRAVENOUS
  Filled 2016-11-04: qty 90

## 2016-11-04 MED ORDER — SODIUM CHLORIDE 0.9 % IV SOLN
Freq: Once | INTRAVENOUS | Status: AC
  Administered 2016-11-04: 09:00:00 via INTRAVENOUS

## 2016-11-04 MED ORDER — DEXAMETHASONE SODIUM PHOSPHATE 10 MG/ML IJ SOLN
INTRAMUSCULAR | Status: AC
  Filled 2016-11-04: qty 1

## 2016-11-04 MED ORDER — FLUOROURACIL CHEMO INJECTION 2.5 GM/50ML
400.0000 mg/m2 | Freq: Once | INTRAVENOUS | Status: AC
  Administered 2016-11-04: 750 mg via INTRAVENOUS
  Filled 2016-11-04: qty 15

## 2016-11-04 MED ORDER — SODIUM CHLORIDE 0.9 % IV SOLN
5.0000 mg/kg | Freq: Once | INTRAVENOUS | Status: AC
  Administered 2016-11-04: 400 mg via INTRAVENOUS
  Filled 2016-11-04: qty 16

## 2016-11-04 MED ORDER — SODIUM CHLORIDE 0.9% FLUSH
10.0000 mL | INTRAVENOUS | Status: DC | PRN
  Filled 2016-11-04: qty 10

## 2016-11-04 MED ORDER — SODIUM CHLORIDE 0.9 % IJ SOLN
10.0000 mL | INTRAMUSCULAR | Status: DC | PRN
  Administered 2016-11-04: 10 mL via INTRAVENOUS
  Filled 2016-11-04: qty 10

## 2016-11-04 MED ORDER — LEUCOVORIN CALCIUM INJECTION 350 MG
400.0000 mg/m2 | Freq: Once | INTRAVENOUS | Status: AC
  Administered 2016-11-04: 752 mg via INTRAVENOUS
  Filled 2016-11-04: qty 37.6

## 2016-11-04 NOTE — Patient Instructions (Signed)

## 2016-11-04 NOTE — Telephone Encounter (Signed)
Gave patient AVS and calender per 5/16 los. [facility] to contact patient

## 2016-11-04 NOTE — Progress Notes (Signed)
Hematology and Oncology Follow Up Visit  BRANDILYNN TAORMINA [id number] Aug 27, 1950    11/04/16   Principle Diagnosis: 66 year old woman diagnosed with colon cancer in June 2014. She had presented with an abdominal pain and found to have a cecal mass resulting in obstruction and appendicitis. She had Likely has stage IV disease with a liver mass.    Prior Therapy:  She is status post laparoscopic laparotomy, evacuation of a pelvic abscess and right hemicolectomy with ileocolonic anastomosis done on December 09, 2012. The pathology showed an invasive, well- differentiated colorectal adenocarcinoma. Tumor invades through the muscularis propria, with 2/18 lymph nodes involved, with the pathological staging T3 N1b disease. Her tumor was found to be microsatellite stable. KRAS wild type.   She is also status post Port-A-Cath insertion that was done on 01/05/2013.  FOLFOX chemotherapy started 01/18/2013. Avastin to be added with cycle 2 on 02/01/2013. She is S/P 12 cycles completed in 05/2013.   She is a status post microwave ablation of metastatic lesion in the posterior segment of the right lobe of the liver completed on 09/28/2014 and repeated on 11/30/2014.  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   Current therapy: FOLFIRI and a Avastin salvage therapy started on 12/10/2015. She has received 5-FU, leucovorin and a Avastin only starting with cycle 10.  She is here for the next cycle of therapy.  Interim History:  Ms. Wiland presents today for a followup visit. Since her last visit, she reports no major changes in her health. She continues to have periodic epistaxis associated with blowing her nose. She denied any brisk or spontaneous bleeding. She tolerated chemotherapy reasonably well without any major changes in her health. She does report mild nausea without vomiting. The symptoms resolved spontaneously. She denied any worsening  neuropathy. She denied any early satiety or abdominal distention. Her performance status and activity level remains the same without any decline at this time.  She is not report any headaches, blurry vision, syncope or seizures. She does not report any fevers, chills or sweats. She does not report any chest pain, palpitation or orthopnea. She does not report any wheezing, hemoptysis or hematemesis. She does not report any frequency urgency or hesitancy. Does not report any lymphadenopathy or petechiae. Rest of her review of systems unremarkable.   Medications: I have reviewed the patient's current medications.  Current Outpatient Prescriptions  Medication Sig Dispense Refill  . acetaminophen (TYLENOL) 500 MG tablet Take 500 mg by mouth every 6 (six) hours as needed for pain.      . diphenhydrAMINE (BENADRYL) 25 mg capsule Take 25-50 mg by mouth daily as needed for itching. For allergic reaction      . lidocaine-prilocaine (EMLA) cream Apply 1 application topically as needed. Apply approx 1/2 tsp to skin over port, prior to chemotherapy treatments  1 kit  3  . Multiple Vitamin (MULTIVITAMIN) tablet Take 1 tablet by mouth daily.      . ondansetron (ZOFRAN) 8 MG tablet Take 1 tablet (8 mg total) by mouth every 8 (eight) hours as needed for nausea.  30 tablet  1   No current facility-administered medications for this visit.     Allergies: No Known Allergies  Past Medical History, Surgical history, Social history, and Family History were reviewed and updated.    Physical Exam:  Blood pressure (!) 145/89, pulse 85, temperature 97.8 F (36.6 C), temperature source Oral, resp. rate 18, height '5\' 4"'$  (1.626  m), weight 167 lb (75.8 kg), SpO2 100 %. ECOG: 0 General appearance: Alert, awake: Without distress. Head: Atraumatic. Oral mucosal without oral ulcers or lesions. Neck: no adenopathy no thyroid masses..  Lymph nodes: Cervical, supraclavicular, and axillary nodes normal. Heart:regular rate  and rhythm, S1, S2 normal, no murmur, rubs or gallops. Lung:chest clear, no wheezing, rales, normal symmetric air entry.  Chest wall examination: Revealed Port-A-Cath site without ecchymosis or drainage. Abdomen: soft, non-tender, without masses or nodularity noted. No shifting dullness or ascites. Neurological: No new neurological deficits noted.  CBC    Component Value Date/Time   WBC 4.1 11/04/2016 0758   WBC 4.0 11/20/2015 0735   RBC 3.62 (L) 11/04/2016 0758   RBC 3.95 11/20/2015 0735   HGB 12.4 11/04/2016 0758   HCT 36.3 11/04/2016 0758   PLT 190 11/04/2016 0758   MCV 100.2 11/04/2016 0758   MCH 34.2 (H) 11/04/2016 0758   MCH 30.6 11/20/2015 0735   MCHC 34.2 11/04/2016 0758   MCHC 34.1 11/20/2015 0735   RDW 18.0 (H) 11/04/2016 0758   LYMPHSABS 1.3 11/04/2016 0758   MONOABS 0.5 11/04/2016 0758   EOSABS 0.1 11/04/2016 0758   BASOSABS 0.0 11/04/2016 0758     Results for MARLEN, KOMAN (MRN [id number]) as of 11/04/2016 08:34  Ref. Range 09/23/2016 08:37 10/07/2016 08:15 10/21/2016 08:15  [facility] ([facility]) Latest Ref Range: 0.00 - 5.00 ng/mL 2.25 2.62 2.45      Impression and Plan:  66 year old woman with the following issues:   1. Advanced Colon cancer. She presented with a large right cecal mass causing obstruction and abscess and appendicitis. After surgical resection she received 12 cycles of chemotherapy with PET/CT scan on 09/12/2013 showed no residual disease actually in the liver that is hypermetabolic.  She is status post liver directed therapy for metastatic disease and to the liver stream and was given in June 2016.  Her PET/CT scan on 11/08/2015 showed progressive omental and peritoneal metastasis. Biopsy proven to be adenocarcinoma of the colon that is KRAS wild-type but NRAS mutated.  She is on salvage FOLFIRI and a Avastin that is well-tolerated. CPT-11 was removed for better tolerance after cycle 10.  CT scan obtain on 12/26/2017continues to show  reasonable response to therapy with decrease in the size of her omental metastasis. She declined primary surgical therapy that included omentectomy.  She is currently receiving maintenance chemotherapy with 5-FU any Avastin.   Plan continue with the current dose and schedule every 2 weeks and repeat imaging studies in June 2018. She will proceed with the treatment break after her May 30 treatment.  2. Intravenous access. Port-A-Cath in place without complications.  3. 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  4. Antiemetics: Compazine is effective in managing her nausea. No recent exacerbation.  5. Neutropenia: Neulasta will be started at this time unless CPT-11 is added in the future.  6. Epistaxis: Very mild and related to a Avastin. This will improve after discontinuation of therapy.  7. Followup: Will be in 2 weeks for the next cycle of chemotherapy.   [facility] MD 11/04/16

## 2016-11-06 ENCOUNTER — Ambulatory Visit (HOSPITAL_BASED_OUTPATIENT_CLINIC_OR_DEPARTMENT_OTHER): Payer: Medicare Other

## 2016-11-06 VITALS — BP 119/78 | HR 73 | Temp 98.3°F | Resp 18

## 2016-11-06 DIAGNOSIS — K6389 Other specified diseases of intestine: Secondary | ICD-10-CM

## 2016-11-06 DIAGNOSIS — C180 Malignant neoplasm of cecum: Secondary | ICD-10-CM | POA: Diagnosis not present

## 2016-11-06 DIAGNOSIS — K769 Liver disease, unspecified: Secondary | ICD-10-CM

## 2016-11-06 DIAGNOSIS — C189 Malignant neoplasm of colon, unspecified: Secondary | ICD-10-CM

## 2016-11-06 DIAGNOSIS — Z452 Encounter for adjustment and management of vascular access device: Secondary | ICD-10-CM

## 2016-11-06 MED ORDER — HEPARIN SOD (PORK) LOCK FLUSH 100 UNIT/ML IV SOLN
500.0000 [IU] | Freq: Once | INTRAVENOUS | Status: AC | PRN
  Administered 2016-11-06: 500 [IU]
  Filled 2016-11-06: qty 5

## 2016-11-06 MED ORDER — SODIUM CHLORIDE 0.9% FLUSH
10.0000 mL | INTRAVENOUS | Status: DC | PRN
  Administered 2016-11-06: 10 mL
  Filled 2016-11-06: qty 10

## 2016-11-06 NOTE — Patient Instructions (Signed)
Implanted Port Insertion, Care After °This sheet gives you information about how to care for yourself after your procedure. Your health care provider may also give you more specific instructions. If you have problems or questions, contact your health care provider. °What can I expect after the procedure? °After your procedure, it is common to have: °· Discomfort at the port insertion site. °· Bruising on the skin over the port. This should improve over 3-4 days. ° °Follow these instructions at home: °Port care °· After your port is placed, you will get a manufacturer's information card. The card has information about your port. Keep this card with you at all times. °· Take care of the port as told by your health care provider. Ask your health care provider if you or a family member can get training for taking care of the port at home. A home health care nurse may also take care of the port. °· Make sure to remember what type of port you have. °Incision care °· Follow instructions from your health care provider about how to take care of your port insertion site. Make sure you: °? Wash your hands with soap and water before you change your bandage (dressing). If soap and water are not available, use hand sanitizer. °? Change your dressing as told by your health care provider. °? Leave stitches (sutures), skin glue, or adhesive strips in place. These skin closures may need to stay in place for 2 weeks or longer. If adhesive strip edges start to loosen and curl up, you may trim the loose edges. Do not remove adhesive strips completely unless your health care provider tells you to do that. °· Check your port insertion site every day for signs of infection. Check for: °? More redness, swelling, or pain. °? More fluid or blood. °? Warmth. °? Pus or a bad smell. °General instructions °· Do not take baths, swim, or use a hot tub until your health care provider approves. °· Do not lift anything that is heavier than 10 lb (4.5  kg) for a week, or as told by your health care provider. °· Ask your health care provider when it is okay to: °? Return to work or school. °? Resume usual physical activities or sports. °· Do not drive for 24 hours if you were given a medicine to help you relax (sedative). °· Take over-the-counter and prescription medicines only as told by your health care provider. °· Wear a medical alert bracelet in case of an emergency. This will tell any health care providers that you have a port. °· Keep all follow-up visits as told by your health care provider. This is important. °Contact a health care provider if: °· You cannot flush your port with saline as directed, or you cannot draw blood from the port. °· You have a fever or chills. °· You have more redness, swelling, or pain around your port insertion site. °· You have more fluid or blood coming from your port insertion site. °· Your port insertion site feels warm to the touch. °· You have pus or a bad smell coming from the port insertion site. °Get help right away if: °· You have chest pain or shortness of breath. °· You have bleeding from your port that you cannot control. °Summary °· Take care of the port as told by your health care provider. °· Change your dressing as told by your health care provider. °· Keep all follow-up visits as told by your health care provider. °  This information is not intended to replace advice given to you by your health care provider. Make sure you discuss any questions you have with your health care provider. °Document Released: 03/29/2013 Document Revised: 04/29/2016 Document Reviewed: 04/29/2016 °Elsevier Interactive Patient Education © 2017 [employer] ° °

## 2016-11-18 ENCOUNTER — Ambulatory Visit (HOSPITAL_BASED_OUTPATIENT_CLINIC_OR_DEPARTMENT_OTHER): Payer: Medicare Other | Admitting: Oncology

## 2016-11-18 ENCOUNTER — Ambulatory Visit: Payer: Medicare Other

## 2016-11-18 ENCOUNTER — Ambulatory Visit (HOSPITAL_BASED_OUTPATIENT_CLINIC_OR_DEPARTMENT_OTHER): Payer: Medicare Other

## 2016-11-18 ENCOUNTER — Telehealth: Payer: Self-pay | Admitting: Oncology

## 2016-11-18 ENCOUNTER — Other Ambulatory Visit (HOSPITAL_BASED_OUTPATIENT_CLINIC_OR_DEPARTMENT_OTHER): Payer: Medicare Other

## 2016-11-18 VITALS — BP 152/95 | HR 81 | Temp 98.1°F | Resp 20 | Ht 64.0 in | Wt 167.5 lb

## 2016-11-18 VITALS — BP 134/83 | HR 72

## 2016-11-18 DIAGNOSIS — Z5112 Encounter for antineoplastic immunotherapy: Secondary | ICD-10-CM

## 2016-11-18 DIAGNOSIS — C189 Malignant neoplasm of colon, unspecified: Secondary | ICD-10-CM

## 2016-11-18 DIAGNOSIS — Z5111 Encounter for antineoplastic chemotherapy: Secondary | ICD-10-CM | POA: Diagnosis not present

## 2016-11-18 DIAGNOSIS — Z95828 Presence of other vascular implants and grafts: Secondary | ICD-10-CM

## 2016-11-18 DIAGNOSIS — K6389 Other specified diseases of intestine: Secondary | ICD-10-CM

## 2016-11-18 DIAGNOSIS — C787 Secondary malignant neoplasm of liver and intrahepatic bile duct: Secondary | ICD-10-CM | POA: Diagnosis not present

## 2016-11-18 DIAGNOSIS — C180 Malignant neoplasm of cecum: Secondary | ICD-10-CM

## 2016-11-18 DIAGNOSIS — Z79899 Other long term (current) drug therapy: Secondary | ICD-10-CM

## 2016-11-18 DIAGNOSIS — K769 Liver disease, unspecified: Secondary | ICD-10-CM

## 2016-11-18 LAB — COMPREHENSIVE METABOLIC PANEL
ALT: 11 U/L (ref 0–55)
AST: 16 U/L (ref 5–34)
Albumin: 3.5 g/dL (ref 3.5–5.0)
Alkaline Phosphatase: 70 U/L (ref 40–150)
Anion Gap: 9 mEq/L (ref 3–11)
BUN: 15.2 mg/dL (ref 7.0–26.0)
CHLORIDE: 107 meq/L (ref 98–109)
CO2: 24 mEq/L (ref 22–29)
CREATININE: 1.1 mg/dL (ref 0.6–1.1)
Calcium: 9.5 mg/dL (ref 8.4–10.4)
EGFR: 63 mL/min/{1.73_m2} — ABNORMAL LOW (ref >90)
GLUCOSE: 97 mg/dL (ref 70–140)
POTASSIUM: 3.8 meq/L (ref 3.5–5.1)
SODIUM: 140 meq/L (ref 136–145)
Total Bilirubin: 0.75 mg/dL (ref 0.20–1.20)
Total Protein: 6.9 g/dL (ref 6.4–8.3)

## 2016-11-18 LAB — CBC WITH DIFFERENTIAL/PLATELET
BASO%: 0.5 % (ref 0.0–2.0)
Basophils Absolute: 0 10*3/uL (ref 0.0–0.1)
EOS%: 2.8 % (ref 0.0–7.0)
Eosinophils Absolute: 0.1 10*3/uL (ref 0.0–0.5)
HCT: 36.5 % (ref 34.8–46.6)
HEMOGLOBIN: 12.3 g/dL (ref 11.6–15.9)
LYMPH#: 1.3 10*3/uL (ref 0.9–3.3)
LYMPH%: 28.9 % (ref 14.0–49.7)
MCH: 34 pg (ref 25.1–34.0)
MCHC: 33.7 g/dL (ref 31.5–36.0)
MCV: 100.9 fL (ref 79.5–101.0)
MONO#: 0.4 10*3/uL (ref 0.1–0.9)
MONO%: 9.9 % (ref 0.0–14.0)
NEUT#: 2.6 10*3/uL (ref 1.5–6.5)
NEUT%: 57.9 % (ref 38.4–76.8)
Platelets: 185 10*3/uL (ref 145–400)
RBC: 3.61 10*6/uL — ABNORMAL LOW (ref 3.70–5.45)
RDW: 18.1 % — AB (ref 11.2–14.5)
WBC: 4.5 10*3/uL (ref 3.9–10.3)

## 2016-11-18 LAB — CEA (IN HOUSE-CHCC): CEA (CHCC-In House): 2.48 ng/mL (ref 0.00–5.00)

## 2016-11-18 LAB — UA PROTEIN, DIPSTICK - CHCC: Protein, ur: NEGATIVE mg/dL

## 2016-11-18 MED ORDER — PALONOSETRON HCL INJECTION 0.25 MG/5ML
INTRAVENOUS | Status: AC
  Filled 2016-11-18: qty 5

## 2016-11-18 MED ORDER — SODIUM CHLORIDE 0.9 % IV SOLN
2400.0000 mg/m2 | INTRAVENOUS | Status: DC
  Administered 2016-11-18: 4500 mg via INTRAVENOUS
  Filled 2016-11-18: qty 90

## 2016-11-18 MED ORDER — SODIUM CHLORIDE 0.9 % IV SOLN
5.0000 mg/kg | Freq: Once | INTRAVENOUS | Status: AC
  Administered 2016-11-18: 400 mg via INTRAVENOUS
  Filled 2016-11-18: qty 16

## 2016-11-18 MED ORDER — SODIUM CHLORIDE 0.9 % IV SOLN
Freq: Once | INTRAVENOUS | Status: AC
  Administered 2016-11-18: 09:00:00 via INTRAVENOUS

## 2016-11-18 MED ORDER — DEXAMETHASONE SODIUM PHOSPHATE 10 MG/ML IJ SOLN
INTRAMUSCULAR | Status: AC
  Filled 2016-11-18: qty 1

## 2016-11-18 MED ORDER — FLUOROURACIL CHEMO INJECTION 2.5 GM/50ML
400.0000 mg/m2 | Freq: Once | INTRAVENOUS | Status: AC
  Administered 2016-11-18: 750 mg via INTRAVENOUS
  Filled 2016-11-18: qty 15

## 2016-11-18 MED ORDER — LEUCOVORIN CALCIUM INJECTION 350 MG
400.0000 mg/m2 | Freq: Once | INTRAMUSCULAR | Status: AC
  Administered 2016-11-18: 752 mg via INTRAVENOUS
  Filled 2016-11-18: qty 37.6

## 2016-11-18 MED ORDER — SODIUM CHLORIDE 0.9 % IJ SOLN
10.0000 mL | INTRAMUSCULAR | Status: DC | PRN
  Administered 2016-11-18: 10 mL via INTRAVENOUS
  Filled 2016-11-18: qty 10

## 2016-11-18 MED ORDER — PALONOSETRON HCL INJECTION 0.25 MG/5ML
0.2500 mg | Freq: Once | INTRAVENOUS | Status: AC
  Administered 2016-11-18: 0.25 mg via INTRAVENOUS

## 2016-11-18 MED ORDER — DEXAMETHASONE SODIUM PHOSPHATE 10 MG/ML IJ SOLN
10.0000 mg | Freq: Once | INTRAMUSCULAR | Status: AC
  Administered 2016-11-18: 10 mg via INTRAVENOUS

## 2016-11-18 NOTE — Progress Notes (Signed)
Hematology and Oncology Follow Up Visit  Margaret Elliott [id number] 06/18/1951    11/18/16   Principle Diagnosis: 66 year old woman diagnosed with colon cancer in June 2014. She had presented with an abdominal pain and found to have a cecal mass resulting in obstruction and appendicitis. She had Likely has stage IV disease with a liver mass.    Prior Therapy:  She is status post laparoscopic laparotomy, evacuation of a pelvic abscess and right hemicolectomy with ileocolonic anastomosis done on December 09, 2012. The pathology showed an invasive, well- differentiated colorectal adenocarcinoma. Tumor invades through the muscularis propria, with 2/18 lymph nodes involved, with the pathological staging T3 N1b disease. Her tumor was found to be microsatellite stable. KRAS wild type.   She is also status post Port-A-Cath insertion that was done on 01/05/2013.  FOLFOX chemotherapy started 01/18/2013. Avastin to be added with cycle 2 on 02/01/2013. She is S/P 12 cycles completed in 05/2013.   She is a status post microwave ablation of metastatic lesion in the posterior segment of the right lobe of the liver completed on 09/28/2014 and repeated on 11/30/2014.  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   Current therapy: FOLFIRI and a Avastin salvage therapy started on 12/10/2015. She has received 5-FU, leucovorin and a Avastin only starting with cycle 10.  She is here for the next cycle of therapy.  Interim History:  Margaret Elliott presents today for a followup visit. Since her last visit, she reports doing reasonably well without any recent complaints. She tolerated chemotherapy without any new side effects. She does report mild nausea without vomiting. The symptoms resolved spontaneously. She denied any worsening neuropathy. She denied any early satiety or abdominal distention. Her performance status and activity level remains the same  without any decline at this time. She continues to work and exercise regularly and enjoying excellent quality of life.  She is not report any headaches, blurry vision, syncope or seizures. She does not report any fevers, chills or sweats. She does not report any chest pain, palpitation or orthopnea. She does not report any wheezing, hemoptysis or hematemesis. She does not report any frequency urgency or hesitancy. Does not report any lymphadenopathy or petechiae. Rest of her review of systems unremarkable.   Medications: I have reviewed the patient's current medications.  Current Outpatient Prescriptions  Medication Sig Dispense Refill  . acetaminophen (TYLENOL) 500 MG tablet Take 500 mg by mouth every 6 (six) hours as needed for pain.      . diphenhydrAMINE (BENADRYL) 25 mg capsule Take 25-50 mg by mouth daily as needed for itching. For allergic reaction      . lidocaine-prilocaine (EMLA) cream Apply 1 application topically as needed. Apply approx 1/2 tsp to skin over port, prior to chemotherapy treatments  1 kit  3  . Multiple Vitamin (MULTIVITAMIN) tablet Take 1 tablet by mouth daily.      . ondansetron (ZOFRAN) 8 MG tablet Take 1 tablet (8 mg total) by mouth every 8 (eight) hours as needed for nausea.  30 tablet  1   No current facility-administered medications for this visit.     Allergies: No Known Allergies  Past Medical History, Surgical history, Social history, and Family History were reviewed and updated.    Physical Exam:  Blood pressure (!) 152/95, pulse 81, temperature 98.1 F (36.7 C), temperature source Oral, resp. rate 20, height '5\' 4"'$  (1.626 m), weight 167 lb 8 oz (76 kg),  SpO2 100 %. ECOG: 0 General appearance: Well-appearing woman without distress.  Oral mucosal without oral ulcers or lesions. Neck: no adenopathy no thyroid masses..  Lymph nodes: Cervical, supraclavicular, and axillary nodes normal. Heart:regular rate and rhythm, S1, S2 normal, no murmur, rubs or  gallops. Lung:chest clear, no wheezing, rales, normal symmetric air entry.  Chest wall examination: Revealed Port-A-Cath site without ecchymosis or drainage. Abdomen: soft, non-tender, without masses or nodularity noted. No rebound or guarding. Neurological: No new neurological deficits noted.  CBC    Component Value Date/Time   WBC 4.5 11/18/2016 0739   WBC 4.0 11/20/2015 0735   RBC 3.61 (L) 11/18/2016 0739   RBC 3.95 11/20/2015 0735   HGB 12.3 11/18/2016 0739   HCT 36.5 11/18/2016 0739   PLT 185 11/18/2016 0739   MCV 100.9 11/18/2016 0739   MCH 34.0 11/18/2016 0739   MCH 30.6 11/20/2015 0735   MCHC 33.7 11/18/2016 0739   MCHC 34.1 11/20/2015 0735   RDW 18.1 (H) 11/18/2016 0739   LYMPHSABS 1.3 11/18/2016 0739   MONOABS 0.4 11/18/2016 0739   EOSABS 0.1 11/18/2016 0739   BASOSABS 0.0 11/18/2016 0739      Results for Margaret Elliott, Margaret Elliott (MRN [id number]) as of 11/18/2016 08:52  Ref. Range 10/07/2016 08:15 10/21/2016 08:15 11/04/2016 07:58  CEA ([facility]) Latest Ref Range: 0.00 - 5.00 ng/mL 2.62 2.45 2.67      Impression and Plan:  66 year old woman with the following issues:   1. Advanced Colon cancer. She presented with a large right cecal mass causing obstruction and abscess and appendicitis. After surgical resection she received 12 cycles of chemotherapy with PET/CT scan on 09/12/2013 showed no residual disease actually in the liver that is hypermetabolic.  She is status post liver directed therapy for metastatic disease and to the liver stream and was given in June 2016.  Her PET/CT scan on 11/08/2015 showed progressive omental and peritoneal metastasis. Biopsy proven to be adenocarcinoma of the colon that is KRAS wild-type but NRAS mutated.  She is on salvage FOLFIRI and a Avastin that is well-tolerated. CPT-11 was removed for better tolerance after cycle 10.  CT scan obtain on 12/26/2017continues to show reasonable response to therapy with decrease in the size of  her omental metastasis. She declined primary surgical therapy that included omentectomy.  She is currently receiving maintenance chemotherapy with 5-FU any Avastin.   The plan is to proceed with that today history without any dose reduction or delay. She'll have imaging studies by the end of June and determine the next course of action.  2. Intravenous access. Port-A-Cath in place without complications.  3. Neuropathy: No change in her upper extremities.  4. Antiemetics: Compazine is effective in managing her nausea. No recent exacerbation.  5. Neutropenia: Neulasta will be started at this time unless CPT-11 is added in the future.  6. Epistaxis: Very mild and related to a Avastin. No issues at this time.  7. Followup: Will be in 2 weeks for the next cycle of chemotherapy.   [facility] MD 11/18/16

## 2016-11-18 NOTE — Progress Notes (Signed)
Blood return noted before, during and after Adrucil push.  

## 2016-11-18 NOTE — Telephone Encounter (Signed)
NO LOS per 5/30 - no additional appts scheduled.

## 2016-11-18 NOTE — Patient Instructions (Signed)
[facility] Discharge Instructions for Patients Receiving Chemotherapy  Today you received the following chemotherapy agents:  Avastin, Leucovorin, Fluorouracil  To help prevent nausea and vomiting after your treatment, we encourage you to take your nausea medication as prescribed.   If you develop nausea and vomiting that is not controlled by your nausea medication, call the clinic.   BELOW ARE SYMPTOMS THAT SHOULD BE REPORTED IMMEDIATELY:  *FEVER GREATER THAN 100.5 F  *CHILLS WITH OR WITHOUT FEVER  NAUSEA AND VOMITING THAT IS NOT CONTROLLED WITH YOUR NAUSEA MEDICATION  *UNUSUAL SHORTNESS OF BREATH  *UNUSUAL BRUISING OR BLEEDING  TENDERNESS IN MOUTH AND THROAT WITH OR WITHOUT PRESENCE OF ULCERS  *URINARY PROBLEMS  *BOWEL PROBLEMS  UNUSUAL RASH Items with * indicate a potential emergency and should be followed up as soon as possible.  Feel free to call the clinic you have any questions or concerns. The clinic phone number is [phone].  Please show the CHEMO ALERT CARD at check-in to the Emergency Department and triage nurse.   

## 2016-11-20 ENCOUNTER — Ambulatory Visit (HOSPITAL_BASED_OUTPATIENT_CLINIC_OR_DEPARTMENT_OTHER): Payer: Medicare Other

## 2016-11-20 VITALS — BP 139/97 | HR 75 | Temp 98.0°F | Resp 18

## 2016-11-20 DIAGNOSIS — K769 Liver disease, unspecified: Secondary | ICD-10-CM

## 2016-11-20 DIAGNOSIS — Z452 Encounter for adjustment and management of vascular access device: Secondary | ICD-10-CM

## 2016-11-20 DIAGNOSIS — C189 Malignant neoplasm of colon, unspecified: Secondary | ICD-10-CM

## 2016-11-20 DIAGNOSIS — C180 Malignant neoplasm of cecum: Secondary | ICD-10-CM | POA: Diagnosis not present

## 2016-11-20 DIAGNOSIS — K6389 Other specified diseases of intestine: Secondary | ICD-10-CM

## 2016-11-20 MED ORDER — HEPARIN SOD (PORK) LOCK FLUSH 100 UNIT/ML IV SOLN
500.0000 [IU] | Freq: Once | INTRAVENOUS | Status: AC | PRN
  Administered 2016-11-20: 500 [IU]
  Filled 2016-11-20: qty 5

## 2016-11-20 MED ORDER — SODIUM CHLORIDE 0.9% FLUSH
10.0000 mL | INTRAVENOUS | Status: DC | PRN
  Administered 2016-11-20: 10 mL
  Filled 2016-11-20: qty 10

## 2016-12-15 ENCOUNTER — Encounter (HOSPITAL_COMMUNITY): Payer: Self-pay

## 2016-12-15 ENCOUNTER — Ambulatory Visit (HOSPITAL_BASED_OUTPATIENT_CLINIC_OR_DEPARTMENT_OTHER): Payer: Medicare Other

## 2016-12-15 ENCOUNTER — Other Ambulatory Visit (HOSPITAL_BASED_OUTPATIENT_CLINIC_OR_DEPARTMENT_OTHER): Payer: Medicare Other

## 2016-12-15 ENCOUNTER — Ambulatory Visit (HOSPITAL_COMMUNITY)
Admission: RE | Admit: 2016-12-15 | Discharge: 2016-12-15 | Disposition: A | Discharge status: Home / Self Care | Payer: Medicare Other | Source: Ambulatory Visit | Attending: Oncology | Admitting: Oncology

## 2016-12-15 DIAGNOSIS — J479 Bronchiectasis, uncomplicated: Secondary | ICD-10-CM | POA: Insufficient documentation

## 2016-12-15 DIAGNOSIS — C180 Malignant neoplasm of cecum: Secondary | ICD-10-CM

## 2016-12-15 DIAGNOSIS — I700 Atherosclerosis of aorta: Secondary | ICD-10-CM | POA: Diagnosis not present

## 2016-12-15 DIAGNOSIS — C189 Malignant neoplasm of colon, unspecified: Secondary | ICD-10-CM | POA: Insufficient documentation

## 2016-12-15 DIAGNOSIS — M5136 Other intervertebral disc degeneration, lumbar region: Secondary | ICD-10-CM | POA: Diagnosis not present

## 2016-12-15 DIAGNOSIS — Z95828 Presence of other vascular implants and grafts: Secondary | ICD-10-CM

## 2016-12-15 LAB — CBC WITH DIFFERENTIAL/PLATELET
BASO%: 0.8 % (ref 0.0–2.0)
BASOS ABS: 0 10*3/uL (ref 0.0–0.1)
EOS%: 2.2 % (ref 0.0–7.0)
Eosinophils Absolute: 0.1 10*3/uL (ref 0.0–0.5)
HEMATOCRIT: 39.5 % (ref 34.8–46.6)
HGB: 13 g/dL (ref 11.6–15.9)
LYMPH#: 1.7 10*3/uL (ref 0.9–3.3)
LYMPH%: 35.4 % (ref 14.0–49.7)
MCH: 33.3 pg (ref 25.1–34.0)
MCHC: 32.9 g/dL (ref 31.5–36.0)
MCV: 101.3 fL — ABNORMAL HIGH (ref 79.5–101.0)
MONO#: 0.5 10*3/uL (ref 0.1–0.9)
MONO%: 10.4 % (ref 0.0–14.0)
NEUT#: 2.5 10*3/uL (ref 1.5–6.5)
NEUT%: 51.2 % (ref 38.4–76.8)
PLATELETS: 222 10*3/uL (ref 145–400)
RBC: 3.9 10*6/uL (ref 3.70–5.45)
RDW: 15.1 % — ABNORMAL HIGH (ref 11.2–14.5)
WBC: 4.9 10*3/uL (ref 3.9–10.3)

## 2016-12-15 LAB — COMPREHENSIVE METABOLIC PANEL
ALT: 12 U/L (ref 0–55)
ANION GAP: 11 meq/L (ref 3–11)
AST: 19 U/L (ref 5–34)
Albumin: 3.6 g/dL (ref 3.5–5.0)
Alkaline Phosphatase: 79 U/L (ref 40–150)
BUN: 14 mg/dL (ref 7.0–26.0)
CALCIUM: 9.6 mg/dL (ref 8.4–10.4)
CHLORIDE: 106 meq/L (ref 98–109)
CO2: 23 mEq/L (ref 22–29)
Creatinine: 1 mg/dL (ref 0.6–1.1)
EGFR: 71 mL/min/{1.73_m2} — ABNORMAL LOW (ref >90)
Glucose: 89 mg/dl (ref 70–140)
POTASSIUM: 4 meq/L (ref 3.5–5.1)
Sodium: 139 mEq/L (ref 136–145)
Total Bilirubin: 0.87 mg/dL (ref 0.20–1.20)
Total Protein: 7.5 g/dL (ref 6.4–8.3)

## 2016-12-15 LAB — CEA (IN HOUSE-CHCC): CEA (CHCC-IN HOUSE): 2.81 ng/mL (ref 0.00–5.00)

## 2016-12-15 MED ORDER — SODIUM CHLORIDE 0.9 % IJ SOLN
10.0000 mL | INTRAMUSCULAR | Status: DC | PRN
  Administered 2016-12-15: 10 mL via INTRAVENOUS
  Filled 2016-12-15: qty 10

## 2016-12-15 MED ORDER — HEPARIN SOD (PORK) LOCK FLUSH 100 UNIT/ML IV SOLN
500.0000 [IU] | Freq: Once | INTRAVENOUS | Status: AC | PRN
  Administered 2016-12-15: 500 [IU] via INTRAVENOUS
  Filled 2016-12-15: qty 5

## 2016-12-15 MED ORDER — HEPARIN SOD (PORK) LOCK FLUSH 100 UNIT/ML IV SOLN
INTRAVENOUS | Status: AC
  Filled 2016-12-15: qty 5

## 2016-12-15 MED ORDER — IOPAMIDOL (ISOVUE-300) INJECTION 61%
100.0000 mL | Freq: Once | INTRAVENOUS | Status: AC | PRN
  Administered 2016-12-15: 100 mL via INTRAVENOUS

## 2016-12-15 MED ORDER — HEPARIN SOD (PORK) LOCK FLUSH 100 UNIT/ML IV SOLN
500.0000 [IU] | Freq: Once | INTRAVENOUS | Status: AC
  Administered 2016-12-15: 500 [IU] via INTRAVENOUS

## 2016-12-15 MED ORDER — IOPAMIDOL (ISOVUE-300) INJECTION 61%
INTRAVENOUS | Status: AC
  Filled 2016-12-15: qty 100

## 2016-12-15 NOTE — Patient Instructions (Signed)

## 2016-12-17 ENCOUNTER — Telehealth: Payer: Self-pay | Admitting: Oncology

## 2016-12-17 ENCOUNTER — Ambulatory Visit (HOSPITAL_BASED_OUTPATIENT_CLINIC_OR_DEPARTMENT_OTHER): Payer: Medicare Other | Admitting: Oncology

## 2016-12-17 VITALS — BP 145/99 | HR 80 | Temp 98.7°F | Resp 18 | Ht 64.0 in | Wt 165.8 lb

## 2016-12-17 DIAGNOSIS — C180 Malignant neoplasm of cecum: Secondary | ICD-10-CM | POA: Diagnosis not present

## 2016-12-17 DIAGNOSIS — C787 Secondary malignant neoplasm of liver and intrahepatic bile duct: Secondary | ICD-10-CM | POA: Diagnosis not present

## 2016-12-17 DIAGNOSIS — C189 Malignant neoplasm of colon, unspecified: Secondary | ICD-10-CM

## 2016-12-17 NOTE — Telephone Encounter (Signed)
Gave patient avs report and appointments for July and August.  °

## 2016-12-17 NOTE — Progress Notes (Signed)
Hematology and Oncology Follow Up Visit  Margaret Elliott [id number] [birth year]    12/17/16   Principle Diagnosis: 66 year old woman diagnosed with colon cancer in June 2014. She had presented with an abdominal pain and found to have a cecal mass resulting in obstruction and appendicitis. She had Likely has stage IV disease with a liver mass.    Prior Therapy:  She is status post laparoscopic laparotomy, evacuation of a pelvic abscess and right hemicolectomy with ileocolonic anastomosis done on December 09, 2012. The pathology showed an invasive, well- differentiated colorectal adenocarcinoma. Tumor invades through the muscularis propria, with 2/18 lymph nodes involved, with the pathological staging T3 N1b disease. Her tumor was found to be microsatellite stable. KRAS wild type.   She is also status post Port-A-Cath insertion that was done on 01/05/2013.  FOLFOX chemotherapy started 01/18/2013. Avastin to be added with cycle 2 on 02/01/2013. She is S/P 12 cycles completed in 05/2013.   She is a status post microwave ablation of metastatic lesion in the posterior segment of the right lobe of the liver completed on 09/28/2014 and repeated on 11/30/2014.  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   Current therapy: FOLFIRI and a Avastin salvage therapy started on 12/10/2015. She has received 5-FU, leucovorin and a Avastin only starting with cycle 10.  Treatment was held after 11/18/2016 and to resume on 02/03/2017.  Interim History:  [name] presents today for a followup visit. Since her last visit, she continues to do well. She has been on treatment break after May 30 chemotherapy and have done well. She denied any residual toxicities related to chemotherapy including neuropathy or excessive fatigue. She is exercising more regularly and have lost weight intentionally. She denied any abdominal pain or distention. She denied any  change in her bowel habits.  She is not report any headaches, blurry vision, syncope or seizures. She does not report any fevers, chills or sweats. She does not report any chest pain, palpitation or orthopnea. She does not report any wheezing, hemoptysis or hematemesis. She does not report any frequency urgency or hesitancy. Does not report any lymphadenopathy or petechiae. Rest of her review of systems unremarkable.   Medications: I have reviewed the patient's current medications.  Current Outpatient Prescriptions  Medication Sig Dispense Refill  . acetaminophen (TYLENOL) 500 MG tablet Take 500 mg by mouth every 6 (six) hours as needed for pain.      . diphenhydrAMINE (BENADRYL) 25 mg capsule Take 25-50 mg by mouth daily as needed for itching. For allergic reaction      . lidocaine-prilocaine (EMLA) cream Apply 1 application topically as needed. Apply approx 1/2 tsp to skin over port, prior to chemotherapy treatments  1 kit  3  . Multiple Vitamin (MULTIVITAMIN) tablet Take 1 tablet by mouth daily.      . ondansetron (ZOFRAN) 8 MG tablet Take 1 tablet (8 mg total) by mouth every 8 (eight) hours as needed for nausea.  30 tablet  1   No current facility-administered medications for this visit.     Allergies: No Known Allergies  Past Medical History, Surgical history, Social history, and Family History were reviewed and updated.    Physical Exam:  Blood pressure (!) 145/99, pulse 80, temperature 98.7 F (37.1 C), temperature source Oral, resp. rate 18, height '5\' 4"'$  (1.626 m), weight 165 lb 12.8 oz (75.2 kg), SpO2 99 %. ECOG: 0 General appearance: Alert, awake: Without distress.  Oropharynx without ulcers or thrush. Neck: no adenopathy no thyroid masses..  Lymph nodes: Cervical, supraclavicular, and axillary nodes normal. Heart:regular rate and rhythm, S1, S2 normal, no murmur, rubs or gallops. Lung:chest clear, no wheezing, rales, normal symmetric air entry.  Chest wall examination:  Revealed Port-A-Cath site without ecchymosis or drainage. Abdomen: soft, non-tender, without masses or nodularity noted. No shifting dullness or ascites. Neurological: No new neurological deficits noted.  CBC    Component Value Date/Time   WBC 4.9 12/15/2016 0814   WBC 4.0 11/20/2015 0735   RBC 3.90 12/15/2016 0814   RBC 3.95 11/20/2015 0735   HGB 13.0 12/15/2016 0814   HCT 39.5 12/15/2016 0814   PLT 222 12/15/2016 0814   MCV 101.3 (H) 12/15/2016 0814   MCH 33.3 12/15/2016 0814   MCH 30.6 11/20/2015 0735   MCHC 32.9 12/15/2016 0814   MCHC 34.1 11/20/2015 0735   RDW 15.1 (H) 12/15/2016 0814   LYMPHSABS 1.7 12/15/2016 0814   MONOABS 0.5 12/15/2016 0814   EOSABS 0.1 12/15/2016 0814   BASOSABS 0.0 12/15/2016 0814    : CT CHEST, ABDOMEN, AND PELVIS WITH CONTRAST  TECHNIQUE: Multidetector CT imaging of the chest, abdomen and pelvis was performed following the standard protocol during bolus administration of intravenous contrast.  CONTRAST:  170m ISOVUE-300 IOPAMIDOL (ISOVUE-300) INJECTION 61%  COMPARISON:  Multiple exams, including 06/16/2016  FINDINGS: CT CHEST FINDINGS  Cardiovascular: Mild aortic arch atherosclerotic calcification. Left Port-A-Cath tip: Cavoatrial junction.  Mediastinum/Nodes: Small paratracheal lymph nodes are not pathologically enlarged by size criteria. Left infrahilar node 0.7 cm in short axis on image 27/2. No overt pathologically enlarged thoracic adenopathy.  Lungs/Pleura: Cylindrical bronchiectasis notable in both lower lobes. 2 mm apicoposterior segment a calcified granuloma in the left upper lobe. 4 mm left upper lobe pulmonary nodule anteriorly on image 63/4, no change 01/16/2013. No new nodules.  Musculoskeletal: Sclerosis in the left posterior sixth rib, no change from 2014 and not previously hypermetabolic, likely benign. Thoracic spondylosis.  CT ABDOMEN PELVIS FINDINGS  Hepatobiliary: Bilobed 5.0 by 1.9 cm  hypodense lesion posteriorly along the right hepatic lobe on image 48/2 corresponding to ablation defect, no appreciable change from recent exams back through January 2017. Tiny hypodensities in the left hepatic lobe on images 42 and 44 of series 2 are likewise unchanged and considered benign. A small hypodensity a long segment IV B next to the falciform ligament on image 51/2 is unchanged over the past 17 months and considered benign. No new or worrisome hepatic lesions are currently identified. Gallbladder slightly contracted.  Pancreas: Unremarkable  Spleen: Unremarkable  Adrenals/Urinary Tract: Adrenal glands normal. Simple appearing right kidney upper pole cyst.  Stomach/Bowel: Unremarkable  Vascular/Lymphatic: Aortoiliac atherosclerotic vascular disease. No pathologic adenopathy identified.  Reproductive: Left adnexal hypodense lesion 2.5 by 1.7 cm, formerly 3.6 by 2.8 cm. Uterus absent.  Other: A band of slightly right eccentric omental tumor near the umbilicus on image 778/2measures 1.0 cm in thickness, previously 1.1 cm. A right sided focus of omental tumor measures 1.2 by 1.7 cm on image 70/2, formerly the same. Other small foci of left-sided omental tumor are not appreciably changed. Overall the omental tumor deposits are judged similar to prior.  Musculoskeletal: Grade 1 degenerative anterolisthesis at L4-5. Foraminal impingement at L4-5 and L5-S1 due to disc uncovering, disc bulge, and facet arthropathy. Small ventral supraumbilical hernias contain adipose tissue.  IMPRESSION: 1. Stable appearance of omental tumor deposits, no appreciable change compared to prior exam. 2. Stable ablation site posteriorly  in the right hepatic lobe, no appreciable change over the last 1.5 years. 3. Reduced size of 3 likely cystic lesion of the left ovary. 4. Lower lumbar foraminal impingement due to spondylosis and degenerative disc disease. 5.  Aortic Atherosclerosis  (ICD10-I70.0). 6. Mild cylindrical bronchiectasis in both lower lobes.       Impression and Plan:  66 year old woman with the following issues:   1. Advanced Colon cancer. She presented with a large right cecal mass causing obstruction and abscess and appendicitis. After surgical resection she received 12 cycles of chemotherapy with PET/CT scan on 09/12/2013 showed no residual disease actually in the liver that is hypermetabolic.  She is status post liver directed therapy for metastatic disease and to the liver stream and was given in June 2016.  Her PET/CT scan on 11/08/2015 showed progressive omental and peritoneal metastasis. Biopsy proven to be adenocarcinoma of the colon that is KRAS wild-type but NRAS mutated.  She is on salvage FOLFIRI and a Avastin that is well-tolerated. CPT-11 was removed for better tolerance after cycle 10.  CT scan obtain on 12/26/2017continues to show reasonable response to therapy with decrease in the size of her omental metastasis. She declined primary surgical therapy that included omentectomy.  She is currently receiving maintenance chemotherapy with 5-FU any Avastin.   CT scan on 12/15/2016 was reviewed today and continues to show stable disease. She has very little residual tumor deposits noted without any evidence of other recurrence.  Risks and benefits of continuing maintenance chemotherapy was discussed today and she is agreeable to resume it after a treatment break. She would like to restart again in mid August and we will plan to do so at this time.  2. Intravenous access. Port-A-Cath in place without complications.  3. Neuropathy: Stable at this time without any exacerbations.  4. Antiemetics: Compazine is effective in managing her nausea. This will be available to her upon the restart of chemotherapy.  5. Followup: Will be in 02/03/2017 to resume maintenance chemotherapy at that time.   [facility] MD 12/17/16

## 2017-02-03 ENCOUNTER — Other Ambulatory Visit (HOSPITAL_BASED_OUTPATIENT_CLINIC_OR_DEPARTMENT_OTHER): Payer: Medicare Other

## 2017-02-03 ENCOUNTER — Ambulatory Visit (HOSPITAL_BASED_OUTPATIENT_CLINIC_OR_DEPARTMENT_OTHER): Payer: Medicare Other

## 2017-02-03 ENCOUNTER — Ambulatory Visit: Payer: Medicare Other

## 2017-02-03 ENCOUNTER — Ambulatory Visit (HOSPITAL_BASED_OUTPATIENT_CLINIC_OR_DEPARTMENT_OTHER): Payer: Medicare Other | Admitting: Oncology

## 2017-02-03 VITALS — BP 135/81

## 2017-02-03 VITALS — BP 154/97 | HR 81 | Temp 98.7°F | Resp 18 | Ht 64.0 in | Wt 167.2 lb

## 2017-02-03 DIAGNOSIS — C189 Malignant neoplasm of colon, unspecified: Secondary | ICD-10-CM

## 2017-02-03 DIAGNOSIS — C786 Secondary malignant neoplasm of retroperitoneum and peritoneum: Secondary | ICD-10-CM

## 2017-02-03 DIAGNOSIS — C180 Malignant neoplasm of cecum: Secondary | ICD-10-CM

## 2017-02-03 DIAGNOSIS — C787 Secondary malignant neoplasm of liver and intrahepatic bile duct: Secondary | ICD-10-CM | POA: Diagnosis not present

## 2017-02-03 DIAGNOSIS — Z5111 Encounter for antineoplastic chemotherapy: Secondary | ICD-10-CM | POA: Diagnosis not present

## 2017-02-03 DIAGNOSIS — Z95828 Presence of other vascular implants and grafts: Secondary | ICD-10-CM

## 2017-02-03 DIAGNOSIS — Z5112 Encounter for antineoplastic immunotherapy: Secondary | ICD-10-CM | POA: Diagnosis not present

## 2017-02-03 DIAGNOSIS — K769 Liver disease, unspecified: Secondary | ICD-10-CM

## 2017-02-03 DIAGNOSIS — K6389 Other specified diseases of intestine: Secondary | ICD-10-CM

## 2017-02-03 LAB — COMPREHENSIVE METABOLIC PANEL
ALT: 13 U/L (ref 0–55)
AST: 23 U/L (ref 5–34)
Albumin: 3.4 g/dL — ABNORMAL LOW (ref 3.5–5.0)
Alkaline Phosphatase: 85 U/L (ref 40–150)
Anion Gap: 7 mEq/L (ref 3–11)
BUN: 18.3 mg/dL (ref 7.0–26.0)
CALCIUM: 9.9 mg/dL (ref 8.4–10.4)
CHLORIDE: 108 meq/L (ref 98–109)
CO2: 25 meq/L (ref 22–29)
Creatinine: 1 mg/dL (ref 0.6–1.1)
EGFR: 69 mL/min/{1.73_m2} — ABNORMAL LOW (ref >90)
Glucose: 107 mg/dl (ref 70–140)
POTASSIUM: 3.9 meq/L (ref 3.5–5.1)
Sodium: 140 mEq/L (ref 136–145)
Total Bilirubin: 0.62 mg/dL (ref 0.20–1.20)
Total Protein: 7.5 g/dL (ref 6.4–8.3)

## 2017-02-03 LAB — CBC WITH DIFFERENTIAL/PLATELET
BASO%: 0.2 % (ref 0.0–2.0)
BASOS ABS: 0 10*3/uL (ref 0.0–0.1)
EOS%: 1.9 % (ref 0.0–7.0)
Eosinophils Absolute: 0.1 10*3/uL (ref 0.0–0.5)
HEMATOCRIT: 38.7 % (ref 34.8–46.6)
HGB: 12.7 g/dL (ref 11.6–15.9)
LYMPH#: 1.6 10*3/uL (ref 0.9–3.3)
LYMPH%: 37 % (ref 14.0–49.7)
MCH: 32.4 pg (ref 25.1–34.0)
MCHC: 32.8 g/dL (ref 31.5–36.0)
MCV: 98.7 fL (ref 79.5–101.0)
MONO#: 0.3 10*3/uL (ref 0.1–0.9)
MONO%: 6.8 % (ref 0.0–14.0)
NEUT#: 2.3 10*3/uL (ref 1.5–6.5)
NEUT%: 54.1 % (ref 38.4–76.8)
Platelets: 208 10*3/uL (ref 145–400)
RBC: 3.92 10*6/uL (ref 3.70–5.45)
RDW: 13.4 % (ref 11.2–14.5)
WBC: 4.2 10*3/uL (ref 3.9–10.3)

## 2017-02-03 LAB — CEA (IN HOUSE-CHCC): CEA (CHCC-IN HOUSE): 2.06 ng/mL (ref 0.00–5.00)

## 2017-02-03 MED ORDER — FLUOROURACIL CHEMO INJECTION 2.5 GM/50ML
400.0000 mg/m2 | Freq: Once | INTRAVENOUS | Status: AC
  Administered 2017-02-03: 750 mg via INTRAVENOUS
  Filled 2017-02-03: qty 15

## 2017-02-03 MED ORDER — DEXAMETHASONE SODIUM PHOSPHATE 10 MG/ML IJ SOLN
INTRAMUSCULAR | Status: AC
  Filled 2017-02-03: qty 1

## 2017-02-03 MED ORDER — SODIUM CHLORIDE 0.9 % IJ SOLN
10.0000 mL | INTRAMUSCULAR | Status: DC | PRN
  Administered 2017-02-03: 10 mL via INTRAVENOUS
  Filled 2017-02-03: qty 10

## 2017-02-03 MED ORDER — SODIUM CHLORIDE 0.9 % IV SOLN
5.0000 mg/kg | Freq: Once | INTRAVENOUS | Status: AC
  Administered 2017-02-03: 400 mg via INTRAVENOUS
  Filled 2017-02-03: qty 16

## 2017-02-03 MED ORDER — LEUCOVORIN CALCIUM INJECTION 350 MG
400.0000 mg/m2 | Freq: Once | INTRAMUSCULAR | Status: AC
  Administered 2017-02-03: 752 mg via INTRAVENOUS
  Filled 2017-02-03: qty 37.6

## 2017-02-03 MED ORDER — SODIUM CHLORIDE 0.9 % IV SOLN
2400.0000 mg/m2 | INTRAVENOUS | Status: DC
  Administered 2017-02-03: 4500 mg via INTRAVENOUS
  Filled 2017-02-03: qty 90

## 2017-02-03 MED ORDER — LORAZEPAM 1 MG PO TABS: 1.0000 mg | ORAL_TABLET | Freq: Three times a day (TID) | ORAL | 0 refills | Status: DC | PRN

## 2017-02-03 MED ORDER — DEXAMETHASONE SODIUM PHOSPHATE 10 MG/ML IJ SOLN
10.0000 mg | Freq: Once | INTRAMUSCULAR | Status: AC
  Administered 2017-02-03: 10 mg via INTRAVENOUS

## 2017-02-03 MED ORDER — PALONOSETRON HCL INJECTION 0.25 MG/5ML
0.2500 mg | Freq: Once | INTRAVENOUS | Status: AC
  Administered 2017-02-03: 0.25 mg via INTRAVENOUS

## 2017-02-03 MED ORDER — LORAZEPAM 2 MG/ML IJ SOLN
INTRAMUSCULAR | Status: AC
  Filled 2017-02-03: qty 1

## 2017-02-03 MED ORDER — LORAZEPAM 1 MG PO TABS
0.5000 mg | ORAL_TABLET | Freq: Once | ORAL | Status: AC
  Administered 2017-02-03: 0.5 mg via ORAL

## 2017-02-03 MED ORDER — SODIUM CHLORIDE 0.9 % IV SOLN
Freq: Once | INTRAVENOUS | Status: AC
  Administered 2017-02-03: 10:00:00 via INTRAVENOUS

## 2017-02-03 MED ORDER — PALONOSETRON HCL INJECTION 0.25 MG/5ML
INTRAVENOUS | Status: AC
  Filled 2017-02-03: qty 5

## 2017-02-03 NOTE — Progress Notes (Signed)
Hematology and Oncology Follow Up Visit  Margaret Elliott [id number] 06-22-1951    02/03/17   Principle Diagnosis: 66 year old woman diagnosed with colon cancer in June 2014. She had presented with an abdominal pain and found to have a cecal mass resulting in obstruction and appendicitis. She had Likely has stage IV disease with a liver mass.    Prior Therapy:  She is status post laparoscopic laparotomy, evacuation of a pelvic abscess and right hemicolectomy with ileocolonic anastomosis done on December 09, 2012. The pathology showed an invasive, well- differentiated colorectal adenocarcinoma. Tumor invades through the muscularis propria, with 2/18 lymph nodes involved, with the pathological staging T3 N1b disease. Her tumor was found to be microsatellite stable. KRAS wild type.   She is also status post Port-A-Cath insertion that was done on 01/05/2013.  FOLFOX chemotherapy started 01/18/2013. Avastin to be added with cycle 2 on 02/01/2013. She is S/P 12 cycles completed in 05/2013.   She is a status post microwave ablation of metastatic lesion in the posterior segment of the right lobe of the liver completed on 09/28/2014 and repeated on 11/30/2014.  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   Current therapy: FOLFIRI and a Avastin salvage therapy started on 12/10/2015. She has received 5-FU, leucovorin and a Avastin only starting with cycle 10.  Treatment was held after 11/18/2016 and to resume on 02/03/2017.  Interim History:  [name] presents today for a followup visit. Since her last visit, she was on a treatment break which she enjoyed it very much. Health was she reports no recent complaints. She denied any abdominal pain, early satiety or constipation. She denied any residual toxicities related to chemotherapy including neuropathy or excessive fatigue. She is exercising more regularly and have lost weight  intentionally. She is experiencing more anticipatory nausea and anxiety associated with the restart of her treatment.  She is not report any headaches, blurry vision, syncope or seizures. She does not report any fevers, chills or sweats. She does not report any chest pain, palpitation or orthopnea. She does not report any wheezing, hemoptysis or hematemesis. She does not report any frequency urgency or hesitancy. Does not report any lymphadenopathy or petechiae. Rest of her review of systems unremarkable.   Medications: I have reviewed the patient's current medications.  Current Outpatient Prescriptions  Medication Sig Dispense Refill  . acetaminophen (TYLENOL) 500 MG tablet Take 500 mg by mouth every 6 (six) hours as needed for pain.      . diphenhydrAMINE (BENADRYL) 25 mg capsule Take 25-50 mg by mouth daily as needed for itching. For allergic reaction      . lidocaine-prilocaine (EMLA) cream Apply 1 application topically as needed. Apply approx 1/2 tsp to skin over port, prior to chemotherapy treatments  1 kit  3  . Multiple Vitamin (MULTIVITAMIN) tablet Take 1 tablet by mouth daily.      . ondansetron (ZOFRAN) 8 MG tablet Take 1 tablet (8 mg total) by mouth every 8 (eight) hours as needed for nausea.  30 tablet  1   No current facility-administered medications for this visit.     Allergies: No Known Allergies  Past Medical History, Surgical history, Social history, and Family History were reviewed and updated.    Physical Exam:  Blood pressure (!) 154/97, pulse 81, temperature 98.7 F (37.1 C), temperature source Oral, resp. rate 18, height '5\' 4"'$  (7.846 m), weight 167 lb 3.2 oz (75.8 kg), SpO2 98 %.  ECOG: 0 General appearance: Well-appearing woman without distress. Oropharynx without ulcers or thrush. Neck: no adenopathy no thyroid masses..  Lymph nodes: Cervical, supraclavicular, and axillary nodes normal. Heart:regular rate and rhythm, S1, S2 normal, no murmur, rubs or  gallops. Lung:chest clear, no wheezing, rales, normal symmetric air entry.  Chest wall examination: Revealed Port-A-Cath site without ecchymosis or drainage. Abdomen: soft, non-tender, without masses or nodularity noted. No rebound or guarding. Neurological: No new neurological deficits noted.  CBC    Component Value Date/Time   WBC 4.2 02/03/2017 0820   WBC 4.0 11/20/2015 0735   RBC 3.92 02/03/2017 0820   RBC 3.95 11/20/2015 0735   HGB 12.7 02/03/2017 0820   HCT 38.7 02/03/2017 0820   PLT 208 02/03/2017 0820   MCV 98.7 02/03/2017 0820   MCH 32.4 02/03/2017 0820   MCH 30.6 11/20/2015 0735   MCHC 32.8 02/03/2017 0820   MCHC 34.1 11/20/2015 0735   RDW 13.4 02/03/2017 0820   LYMPHSABS 1.6 02/03/2017 0820   MONOABS 0.3 02/03/2017 0820   EOSABS 0.1 02/03/2017 0820   BASOSABS 0.0 02/03/2017 0820        Impression and Plan:  66 year old woman with the following issues:   1. Advanced Colon cancer. She presented with a large right cecal mass causing obstruction and abscess and appendicitis. After surgical resection she received 12 cycles of chemotherapy with PET/CT scan on 09/12/2013 showed no residual disease actually in the liver that is hypermetabolic.  She is status post liver directed therapy for metastatic disease and to the liver stream and was given in June 2016.  Her PET/CT scan on 11/08/2015 showed progressive omental and peritoneal metastasis. Biopsy proven to be adenocarcinoma of the colon that is KRAS wild-type but NRAS mutated.  She is on salvage FOLFIRI and a Avastin that is well-tolerated. CPT-11 was removed for better tolerance after cycle 10.  CT scan obtain on 12/26/2017continues to show reasonable response to therapy with decrease in the size of her omental metastasis. She declined primary surgical therapy that included omentectomy.  She is currently receiving maintenance chemotherapy with 5-FU any Avastin.   CT scan on 12/15/2016 ontinues to show stable  disease. She has very little residual tumor deposits noted without any evidence of other recurrence.  The plan is to continue with maintenance therapy including a 5-FU, leucovorin and a Avastin. We will repeat imaging studies in December 2018 and different salvage therapy will be used if she develops progression of disease.  2. Intravenous access. Port-A-Cath will be used without complications noted.  3. Neuropathy: Appears to be improving at this time.  4. Antiemetics: Compazine is effective in managing her nausea. I will add Ativan to her premedication given anticipatory nausea. Prescription was also given to patient to use as needed.  5. Followup: Will be every 2 weeks for maintenance chemotherapy. She'll have an M.D. follow-up on 03/03/2017.   [facility] MD 02/03/17

## 2017-02-03 NOTE — Patient Instructions (Signed)
[facility] Discharge Instructions for Patients Receiving Chemotherapy  Today you received the following chemotherapy agents leucovorin/florouracil   To help prevent nausea and vomiting after your treatment, we encourage you to take your nausea medication as directed   If you develop nausea and vomiting that is not controlled by your nausea medication, call the clinic.   BELOW ARE SYMPTOMS THAT SHOULD BE REPORTED IMMEDIATELY:  *FEVER GREATER THAN 100.5 F  *CHILLS WITH OR WITHOUT FEVER  NAUSEA AND VOMITING THAT IS NOT CONTROLLED WITH YOUR NAUSEA MEDICATION  *UNUSUAL SHORTNESS OF BREATH  *UNUSUAL BRUISING OR BLEEDING  TENDERNESS IN MOUTH AND THROAT WITH OR WITHOUT PRESENCE OF ULCERS  *URINARY PROBLEMS  *BOWEL PROBLEMS  UNUSUAL RASH Items with * indicate a potential emergency and should be followed up as soon as possible.  Feel free to call the clinic you have any questions or concerns. The clinic phone number is [phone].  

## 2017-02-05 ENCOUNTER — Ambulatory Visit (HOSPITAL_BASED_OUTPATIENT_CLINIC_OR_DEPARTMENT_OTHER): Payer: Medicare Other

## 2017-02-05 VITALS — BP 107/69 | HR 87 | Temp 98.1°F | Resp 18

## 2017-02-05 DIAGNOSIS — C180 Malignant neoplasm of cecum: Secondary | ICD-10-CM

## 2017-02-05 DIAGNOSIS — C189 Malignant neoplasm of colon, unspecified: Secondary | ICD-10-CM

## 2017-02-05 DIAGNOSIS — K769 Liver disease, unspecified: Secondary | ICD-10-CM

## 2017-02-05 DIAGNOSIS — K6389 Other specified diseases of intestine: Secondary | ICD-10-CM

## 2017-02-05 MED ORDER — SODIUM CHLORIDE 0.9% FLUSH
10.0000 mL | INTRAVENOUS | Status: DC | PRN
  Administered 2017-02-05: 10 mL
  Filled 2017-02-05: qty 10

## 2017-02-05 MED ORDER — HEPARIN SOD (PORK) LOCK FLUSH 100 UNIT/ML IV SOLN
500.0000 [IU] | Freq: Once | INTRAVENOUS | Status: AC | PRN
Start: 2017-02-05 — End: 2017-02-05
  Administered 2017-02-05: 500 [IU]
  Filled 2017-02-05: qty 5

## 2017-02-17 ENCOUNTER — Ambulatory Visit: Payer: Medicare Other

## 2017-02-17 ENCOUNTER — Ambulatory Visit (HOSPITAL_BASED_OUTPATIENT_CLINIC_OR_DEPARTMENT_OTHER): Payer: Medicare Other

## 2017-02-17 ENCOUNTER — Other Ambulatory Visit (HOSPITAL_BASED_OUTPATIENT_CLINIC_OR_DEPARTMENT_OTHER): Payer: Medicare Other

## 2017-02-17 VITALS — BP 136/90 | HR 87 | Temp 97.6°F

## 2017-02-17 DIAGNOSIS — Z95828 Presence of other vascular implants and grafts: Secondary | ICD-10-CM

## 2017-02-17 DIAGNOSIS — Z5111 Encounter for antineoplastic chemotherapy: Secondary | ICD-10-CM | POA: Diagnosis not present

## 2017-02-17 DIAGNOSIS — C180 Malignant neoplasm of cecum: Secondary | ICD-10-CM

## 2017-02-17 DIAGNOSIS — Z5112 Encounter for antineoplastic immunotherapy: Secondary | ICD-10-CM | POA: Diagnosis present

## 2017-02-17 DIAGNOSIS — C787 Secondary malignant neoplasm of liver and intrahepatic bile duct: Secondary | ICD-10-CM

## 2017-02-17 DIAGNOSIS — C189 Malignant neoplasm of colon, unspecified: Secondary | ICD-10-CM

## 2017-02-17 DIAGNOSIS — K769 Liver disease, unspecified: Secondary | ICD-10-CM

## 2017-02-17 DIAGNOSIS — K6389 Other specified diseases of intestine: Secondary | ICD-10-CM

## 2017-02-17 LAB — COMPREHENSIVE METABOLIC PANEL
ALBUMIN: 3.4 g/dL — AB (ref 3.5–5.0)
ALK PHOS: 59 U/L (ref 40–150)
ALT: 13 U/L (ref 0–55)
AST: 18 U/L (ref 5–34)
Anion Gap: 6 mEq/L (ref 3–11)
BUN: 13.8 mg/dL (ref 7.0–26.0)
CALCIUM: 9.9 mg/dL (ref 8.4–10.4)
CO2: 27 mEq/L (ref 22–29)
Chloride: 106 mEq/L (ref 98–109)
Creatinine: 1 mg/dL (ref 0.6–1.1)
EGFR: 67 mL/min/{1.73_m2} — AB (ref >90)
Glucose: 99 mg/dl (ref 70–140)
POTASSIUM: 4 meq/L (ref 3.5–5.1)
Sodium: 139 mEq/L (ref 136–145)
Total Bilirubin: 1.02 mg/dL (ref 0.20–1.20)
Total Protein: 7.5 g/dL (ref 6.4–8.3)

## 2017-02-17 LAB — CBC WITH DIFFERENTIAL/PLATELET
BASO%: 0.4 % (ref 0.0–2.0)
BASOS ABS: 0 10*3/uL (ref 0.0–0.1)
EOS ABS: 0.1 10*3/uL (ref 0.0–0.5)
EOS%: 1.6 % (ref 0.0–7.0)
HEMATOCRIT: 39.2 % (ref 34.8–46.6)
HEMOGLOBIN: 13.1 g/dL (ref 11.6–15.9)
LYMPH%: 34.1 % (ref 14.0–49.7)
MCH: 32.5 pg (ref 25.1–34.0)
MCHC: 33.3 g/dL (ref 31.5–36.0)
MCV: 97.5 fL (ref 79.5–101.0)
MONO#: 0.4 10*3/uL (ref 0.1–0.9)
MONO%: 9.4 % (ref 0.0–14.0)
NEUT#: 2.1 10*3/uL (ref 1.5–6.5)
NEUT%: 54.5 % (ref 38.4–76.8)
Platelets: 213 10*3/uL (ref 145–400)
RBC: 4.02 10*6/uL (ref 3.70–5.45)
RDW: 14 % (ref 11.2–14.5)
WBC: 3.9 10*3/uL (ref 3.9–10.3)
lymph#: 1.3 10*3/uL (ref 0.9–3.3)

## 2017-02-17 LAB — CEA (IN HOUSE-CHCC): CEA (CHCC-IN HOUSE): 2.29 ng/mL (ref 0.00–5.00)

## 2017-02-17 MED ORDER — DEXTROSE 5 % IV SOLN
400.0000 mg/m2 | Freq: Once | INTRAVENOUS | Status: AC
  Administered 2017-02-17: 752 mg via INTRAVENOUS
  Filled 2017-02-17: qty 37.6

## 2017-02-17 MED ORDER — FLUOROURACIL CHEMO INJECTION 2.5 GM/50ML
400.0000 mg/m2 | Freq: Once | INTRAVENOUS | Status: AC
  Administered 2017-02-17: 750 mg via INTRAVENOUS
  Filled 2017-02-17: qty 15

## 2017-02-17 MED ORDER — PALONOSETRON HCL INJECTION 0.25 MG/5ML
0.2500 mg | Freq: Once | INTRAVENOUS | Status: AC
  Administered 2017-02-17: 0.25 mg via INTRAVENOUS

## 2017-02-17 MED ORDER — LORAZEPAM 2 MG/ML IJ SOLN
INTRAMUSCULAR | Status: AC
  Filled 2017-02-17: qty 1

## 2017-02-17 MED ORDER — LORAZEPAM 1 MG PO TABS
0.5000 mg | ORAL_TABLET | Freq: Once | ORAL | Status: AC
  Administered 2017-02-17: 0.5 mg via ORAL

## 2017-02-17 MED ORDER — SODIUM CHLORIDE 0.9 % IJ SOLN
10.0000 mL | INTRAMUSCULAR | Status: DC | PRN
  Administered 2017-02-17: 10 mL via INTRAVENOUS
  Filled 2017-02-17: qty 10

## 2017-02-17 MED ORDER — SODIUM CHLORIDE 0.9 % IV SOLN
Freq: Once | INTRAVENOUS | Status: AC
  Administered 2017-02-17: 10:00:00 via INTRAVENOUS

## 2017-02-17 MED ORDER — SODIUM CHLORIDE 0.9 % IV SOLN
2400.0000 mg/m2 | INTRAVENOUS | Status: DC
  Administered 2017-02-17: 4500 mg via INTRAVENOUS
  Filled 2017-02-17: qty 90

## 2017-02-17 MED ORDER — DEXAMETHASONE SODIUM PHOSPHATE 10 MG/ML IJ SOLN
10.0000 mg | Freq: Once | INTRAMUSCULAR | Status: AC
  Administered 2017-02-17: 10 mg via INTRAVENOUS

## 2017-02-17 MED ORDER — PALONOSETRON HCL INJECTION 0.25 MG/5ML
INTRAVENOUS | Status: AC
  Filled 2017-02-17: qty 5

## 2017-02-17 MED ORDER — SODIUM CHLORIDE 0.9 % IV SOLN
5.0000 mg/kg | Freq: Once | INTRAVENOUS | Status: AC
  Administered 2017-02-17: 400 mg via INTRAVENOUS
  Filled 2017-02-17: qty 16

## 2017-02-17 MED ORDER — DEXAMETHASONE SODIUM PHOSPHATE 10 MG/ML IJ SOLN
INTRAMUSCULAR | Status: AC
  Filled 2017-02-17: qty 1

## 2017-02-17 MED ORDER — LORAZEPAM 1 MG PO TABS
ORAL_TABLET | ORAL | Status: AC
  Filled 2017-02-17: qty 1

## 2017-02-17 NOTE — Patient Instructions (Signed)
[facility] Discharge Instructions for Patients Receiving Chemotherapy  Today you received the following chemotherapy agents: Avastin, Leucovorin and Fluorouracil.  To help prevent nausea and vomiting after your treatment, we encourage you to take your nausea medication: Compazine. Take one every 6 hours as needed. Take Ativan for nausea not relieved by Compazine.  If you develop nausea and vomiting that is not controlled by your nausea medication, call the clinic.   BELOW ARE SYMPTOMS THAT SHOULD BE REPORTED IMMEDIATELY:  *FEVER GREATER THAN 100.5 F  *CHILLS WITH OR WITHOUT FEVER  NAUSEA AND VOMITING THAT IS NOT CONTROLLED WITH YOUR NAUSEA MEDICATION  *UNUSUAL SHORTNESS OF BREATH  *UNUSUAL BRUISING OR BLEEDING  TENDERNESS IN MOUTH AND THROAT WITH OR WITHOUT PRESENCE OF ULCERS  *URINARY PROBLEMS  *BOWEL PROBLEMS  UNUSUAL RASH Items with * indicate a potential emergency and should be followed up as soon as possible.  Feel free to call the clinic should you have any questions or concerns. The clinic phone number is [phone].  Please show the [name] at check-in to the Emergency Department and triage nurse.

## 2017-02-19 ENCOUNTER — Other Ambulatory Visit: Payer: Self-pay | Admitting: *Deleted

## 2017-02-19 ENCOUNTER — Ambulatory Visit (HOSPITAL_BASED_OUTPATIENT_CLINIC_OR_DEPARTMENT_OTHER): Payer: Medicare Other

## 2017-02-19 VITALS — BP 128/87 | HR 77 | Temp 98.1°F

## 2017-02-19 DIAGNOSIS — C787 Secondary malignant neoplasm of liver and intrahepatic bile duct: Secondary | ICD-10-CM

## 2017-02-19 DIAGNOSIS — C180 Malignant neoplasm of cecum: Secondary | ICD-10-CM | POA: Diagnosis not present

## 2017-02-19 DIAGNOSIS — K769 Liver disease, unspecified: Secondary | ICD-10-CM

## 2017-02-19 DIAGNOSIS — C189 Malignant neoplasm of colon, unspecified: Secondary | ICD-10-CM

## 2017-02-19 DIAGNOSIS — K6389 Other specified diseases of intestine: Secondary | ICD-10-CM

## 2017-02-19 MED ORDER — HEPARIN SOD (PORK) LOCK FLUSH 100 UNIT/ML IV SOLN
500.0000 [IU] | Freq: Once | INTRAVENOUS | Status: AC | PRN
  Administered 2017-02-19: 500 [IU]
  Filled 2017-02-19: qty 5

## 2017-02-19 MED ORDER — SODIUM CHLORIDE 0.9% FLUSH
10.0000 mL | INTRAVENOUS | Status: DC | PRN
  Administered 2017-02-19: 10 mL
  Filled 2017-02-19: qty 10

## 2017-02-19 MED ORDER — ONDANSETRON HCL 8 MG PO TABS: ORAL_TABLET | ORAL | 0 refills | Status: DC

## 2017-02-19 NOTE — Progress Notes (Signed)
Pt stated Compazine was not working for her nausea. Dr Alvy Bimler notified. Rx for Zofran called in to pt pharmacy and pt educated on schedule of administration. Pt verbalized understanding.

## 2017-02-19 NOTE — Patient Instructions (Signed)
Implanted Port Home Guide An implanted port is a type of central line that is placed under the skin. Central lines are used to provide IV access when treatment or nutrition needs to be given through a person's veins. Implanted ports are used for long-term IV access. An implanted port may be placed because:  You need IV medicine that would be irritating to the small veins in your hands or arms.  You need long-term IV medicines, such as antibiotics.  You need IV nutrition for a long period.  You need frequent blood draws for lab tests.  You need dialysis.  Implanted ports are usually placed in the chest area, but they can also be placed in the upper arm, the abdomen, or the leg. An implanted port has two main parts:  Reservoir. The reservoir is round and will appear as a small, raised area under your skin. The reservoir is the part where a needle is inserted to give medicines or draw blood.  Catheter. The catheter is a thin, flexible tube that extends from the reservoir. The catheter is placed into a large vein. Medicine that is inserted into the reservoir goes into the catheter and then into the vein.  How will I care for my incision site? Do not get the incision site wet. Bathe or shower as directed by your health care provider. How is my port accessed? Special steps must be taken to access the port:  Before the port is accessed, a numbing cream can be placed on the skin. This helps numb the skin over the port site.  Your health care provider uses a sterile technique to access the port. ? Your health care provider must put on a mask and sterile gloves. ? The skin over your port is cleaned carefully with an antiseptic and allowed to dry. ? The port is gently pinched between sterile gloves, and a needle is inserted into the port.  Only "non-coring" port needles should be used to access the port. Once the port is accessed, a blood return should be checked. This helps ensure that the port  is in the vein and is not clogged.  If your port needs to remain accessed for a constant infusion, a clear (transparent) bandage will be placed over the needle site. The bandage and needle will need to be changed every week, or as directed by your health care provider.  Keep the bandage covering the needle clean and dry. Do not get it wet. Follow your health care provider's instructions on how to take a shower or bath while the port is accessed.  If your port does not need to stay accessed, no bandage is needed over the port.  What is flushing? Flushing helps keep the port from getting clogged. Follow your health care provider's instructions on how and when to flush the port. Ports are usually flushed with saline solution or a medicine called heparin. The need for flushing will depend on how the port is used.  If the port is used for intermittent medicines or blood draws, the port will need to be flushed: ? After medicines have been given. ? After blood has been drawn. ? As part of routine maintenance.  If a constant infusion is running, the port may not need to be flushed.  How long will my port stay implanted? The port can stay in for as long as your health care provider thinks it is needed. When it is time for the port to come out, surgery will be   done to remove it. The procedure is similar to the one performed when the port was put in. When should I seek immediate medical care? When you have an implanted port, you should seek immediate medical care if:  You notice a bad smell coming from the incision site.  You have swelling, redness, or drainage at the incision site.  You have more swelling or pain at the port site or the surrounding area.  You have a fever that is not controlled with medicine.  This information is not intended to replace advice given to you by your health care provider. Make sure you discuss any questions you have with your health care provider. Document  Released: 06/08/2005 Document Revised: 11/14/2015 Document Reviewed: 02/13/2013 Elsevier Interactive Patient Education  2017 [employer]  

## 2017-03-03 ENCOUNTER — Ambulatory Visit: Payer: Medicare Other

## 2017-03-03 ENCOUNTER — Telehealth: Payer: Self-pay | Admitting: Oncology

## 2017-03-03 ENCOUNTER — Ambulatory Visit (HOSPITAL_BASED_OUTPATIENT_CLINIC_OR_DEPARTMENT_OTHER): Payer: Medicare Other

## 2017-03-03 ENCOUNTER — Ambulatory Visit (HOSPITAL_BASED_OUTPATIENT_CLINIC_OR_DEPARTMENT_OTHER): Payer: Medicare Other | Admitting: Oncology

## 2017-03-03 ENCOUNTER — Other Ambulatory Visit (HOSPITAL_BASED_OUTPATIENT_CLINIC_OR_DEPARTMENT_OTHER): Payer: Medicare Other

## 2017-03-03 VITALS — BP 147/82 | HR 75 | Temp 97.8°F | Resp 17 | Ht 64.0 in | Wt 165.8 lb

## 2017-03-03 DIAGNOSIS — C786 Secondary malignant neoplasm of retroperitoneum and peritoneum: Secondary | ICD-10-CM | POA: Diagnosis not present

## 2017-03-03 DIAGNOSIS — C787 Secondary malignant neoplasm of liver and intrahepatic bile duct: Secondary | ICD-10-CM

## 2017-03-03 DIAGNOSIS — Z5112 Encounter for antineoplastic immunotherapy: Secondary | ICD-10-CM

## 2017-03-03 DIAGNOSIS — Z79899 Other long term (current) drug therapy: Secondary | ICD-10-CM | POA: Diagnosis not present

## 2017-03-03 DIAGNOSIS — C189 Malignant neoplasm of colon, unspecified: Secondary | ICD-10-CM

## 2017-03-03 DIAGNOSIS — K6389 Other specified diseases of intestine: Secondary | ICD-10-CM

## 2017-03-03 DIAGNOSIS — C180 Malignant neoplasm of cecum: Secondary | ICD-10-CM | POA: Diagnosis not present

## 2017-03-03 DIAGNOSIS — Z5111 Encounter for antineoplastic chemotherapy: Secondary | ICD-10-CM

## 2017-03-03 DIAGNOSIS — Z95828 Presence of other vascular implants and grafts: Secondary | ICD-10-CM

## 2017-03-03 DIAGNOSIS — K769 Liver disease, unspecified: Secondary | ICD-10-CM

## 2017-03-03 LAB — CBC WITH DIFFERENTIAL/PLATELET
BASO%: 0.3 % (ref 0.0–2.0)
Basophils Absolute: 0 10*3/uL (ref 0.0–0.1)
EOS ABS: 0.1 10*3/uL (ref 0.0–0.5)
EOS%: 1.8 % (ref 0.0–7.0)
HCT: 36.3 % (ref 34.8–46.6)
HGB: 11.9 g/dL (ref 11.6–15.9)
LYMPH%: 39.2 % (ref 14.0–49.7)
MCH: 32.1 pg (ref 25.1–34.0)
MCHC: 32.8 g/dL (ref 31.5–36.0)
MCV: 97.8 fL (ref 79.5–101.0)
MONO#: 0.3 10*3/uL (ref 0.1–0.9)
MONO%: 8.9 % (ref 0.0–14.0)
NEUT#: 1.9 10*3/uL (ref 1.5–6.5)
NEUT%: 49.8 % (ref 38.4–76.8)
PLATELETS: 182 10*3/uL (ref 145–400)
RBC: 3.71 10*6/uL (ref 3.70–5.45)
RDW: 14.3 % (ref 11.2–14.5)
WBC: 3.8 10*3/uL — ABNORMAL LOW (ref 3.9–10.3)
lymph#: 1.5 10*3/uL (ref 0.9–3.3)

## 2017-03-03 LAB — COMPREHENSIVE METABOLIC PANEL
ALT: 10 U/L (ref 0–55)
ANION GAP: 7 meq/L (ref 3–11)
AST: 16 U/L (ref 5–34)
Albumin: 3.2 g/dL — ABNORMAL LOW (ref 3.5–5.0)
Alkaline Phosphatase: 71 U/L (ref 40–150)
BUN: 12.6 mg/dL (ref 7.0–26.0)
CHLORIDE: 109 meq/L (ref 98–109)
CO2: 24 meq/L (ref 22–29)
Calcium: 9.6 mg/dL (ref 8.4–10.4)
Creatinine: 1 mg/dL (ref 0.6–1.1)
EGFR: 70 mL/min/{1.73_m2} — AB (ref >90)
Glucose: 93 mg/dl (ref 70–140)
POTASSIUM: 3.9 meq/L (ref 3.5–5.1)
Sodium: 140 mEq/L (ref 136–145)
Total Bilirubin: 0.58 mg/dL (ref 0.20–1.20)
Total Protein: 6.9 g/dL (ref 6.4–8.3)

## 2017-03-03 LAB — UA PROTEIN, DIPSTICK - CHCC: PROTEIN: NEGATIVE mg/dL

## 2017-03-03 LAB — CEA (IN HOUSE-CHCC): CEA (CHCC-In House): 2.45 ng/mL (ref 0.00–5.00)

## 2017-03-03 MED ORDER — SODIUM CHLORIDE 0.9% FLUSH
10.0000 mL | INTRAVENOUS | Status: DC | PRN
  Filled 2017-03-03: qty 10

## 2017-03-03 MED ORDER — SODIUM CHLORIDE 0.9 % IV SOLN
2400.0000 mg/m2 | INTRAVENOUS | Status: DC
  Administered 2017-03-03: 4500 mg via INTRAVENOUS
  Filled 2017-03-03: qty 90

## 2017-03-03 MED ORDER — LORAZEPAM 1 MG PO TABS
0.5000 mg | ORAL_TABLET | Freq: Once | ORAL | Status: AC
  Administered 2017-03-03: 0.5 mg via ORAL

## 2017-03-03 MED ORDER — DEXAMETHASONE SODIUM PHOSPHATE 10 MG/ML IJ SOLN
10.0000 mg | Freq: Once | INTRAMUSCULAR | Status: AC
  Administered 2017-03-03: 10 mg via INTRAVENOUS

## 2017-03-03 MED ORDER — SODIUM CHLORIDE 0.9 % IJ SOLN
10.0000 mL | INTRAMUSCULAR | Status: DC | PRN
  Administered 2017-03-03: 10 mL via INTRAVENOUS
  Filled 2017-03-03: qty 10

## 2017-03-03 MED ORDER — PALONOSETRON HCL INJECTION 0.25 MG/5ML
0.2500 mg | Freq: Once | INTRAVENOUS | Status: AC
  Administered 2017-03-03: 0.25 mg via INTRAVENOUS

## 2017-03-03 MED ORDER — LORAZEPAM 1 MG PO TABS
ORAL_TABLET | ORAL | Status: AC
  Filled 2017-03-03: qty 1

## 2017-03-03 MED ORDER — PALONOSETRON HCL INJECTION 0.25 MG/5ML
INTRAVENOUS | Status: AC
  Filled 2017-03-03: qty 5

## 2017-03-03 MED ORDER — DEXAMETHASONE SODIUM PHOSPHATE 10 MG/ML IJ SOLN
INTRAMUSCULAR | Status: AC
  Filled 2017-03-03: qty 1

## 2017-03-03 MED ORDER — FLUOROURACIL CHEMO INJECTION 2.5 GM/50ML
400.0000 mg/m2 | Freq: Once | INTRAVENOUS | Status: AC
  Administered 2017-03-03: 750 mg via INTRAVENOUS
  Filled 2017-03-03: qty 15

## 2017-03-03 MED ORDER — LEUCOVORIN CALCIUM INJECTION 350 MG
400.0000 mg/m2 | Freq: Once | INTRAMUSCULAR | Status: AC
  Administered 2017-03-03: 752 mg via INTRAVENOUS
  Filled 2017-03-03: qty 37.6

## 2017-03-03 MED ORDER — SODIUM CHLORIDE 0.9 % IV SOLN: Freq: Once | INTRAVENOUS | Status: DC

## 2017-03-03 MED ORDER — HEPARIN SOD (PORK) LOCK FLUSH 100 UNIT/ML IV SOLN
500.0000 [IU] | Freq: Once | INTRAVENOUS | Status: DC | PRN
  Filled 2017-03-03: qty 5

## 2017-03-03 MED ORDER — SODIUM CHLORIDE 0.9 % IV SOLN
5.0000 mg/kg | Freq: Once | INTRAVENOUS | Status: AC
  Administered 2017-03-03: 400 mg via INTRAVENOUS
  Filled 2017-03-03: qty 16

## 2017-03-03 NOTE — Telephone Encounter (Signed)
Gave patient avs report and appointments for September and October  °

## 2017-03-03 NOTE — Progress Notes (Signed)
Hematology and Oncology Follow Up Visit  Margaret Elliott [id number] 03/18/1951    03/03/17   Principle Diagnosis: 66-year-old woman diagnosed with colon cancer in June 2014. She had presented with an abdominal pain and found to have a cecal mass resulting in obstruction and appendicitis. She had Likely has stage IV disease with a liver mass.    Prior Therapy:  She is status post laparoscopic laparotomy, evacuation of a pelvic abscess and right hemicolectomy with ileocolonic anastomosis done on December 09, 2012. The pathology showed an invasive, well- differentiated colorectal adenocarcinoma. Tumor invades through the muscularis propria, with 2/18 lymph nodes involved, with the pathological staging T3 N1b disease. Her tumor was found to be microsatellite stable. KRAS wild type.   She is also status post Port-A-Cath insertion that was done on 01/05/2013.  FOLFOX chemotherapy started 01/18/2013. Avastin to be added with cycle 2 on 02/01/2013. She is S/P 12 cycles completed in 05/2013.   She is a status post microwave ablation of metastatic lesion in the posterior segment of the right lobe of the liver completed on 09/28/2014 and repeated on 11/30/2014.  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   Current therapy: FOLFIRI and a Avastin salvage therapy started on 12/10/2015. She has received 5-FU, leucovorin and a Avastin only starting with cycle 10.  Treatment was held after 11/18/2016 and resumed on 02/03/2017. She is here to receive the next treatment.  Interim History:  Margaret Elliott presents today for a followup visit. Since her last visit, she tolerated the restart of chemotherapy without complications. She denied any nausea, vomiting but did have mild diarrhea that is manageable. She denied any abdominal pain, early satiety or constipation. She denied any residual toxicities related to chemotherapy including neuropathy or  excessive fatigue. She continues to perform work-related duties without any decline ability to do so.  She is not report any headaches, blurry vision, syncope or seizures. She does not report any fevers, chills or sweats. She does not report any chest pain, palpitation or orthopnea. She does not report any wheezing, hemoptysis or hematemesis. She does not report any frequency urgency or hesitancy. Does not report any lymphadenopathy or petechiae. Rest of her review of systems unremarkable.   Medications: I have reviewed the patient's current medications.  Current Outpatient Prescriptions  Medication Sig Dispense Refill  . acetaminophen (TYLENOL) 500 MG tablet Take 500 mg by mouth every 6 (six) hours as needed for pain.      . diphenhydrAMINE (BENADRYL) 25 mg capsule Take 25-50 mg by mouth daily as needed for itching. For allergic reaction      . lidocaine-prilocaine (EMLA) cream Apply 1 application topically as needed. Apply approx 1/2 tsp to skin over port, prior to chemotherapy treatments  1 kit  3  . Multiple Vitamin (MULTIVITAMIN) tablet Take 1 tablet by mouth daily.      . ondansetron (ZOFRAN) 8 MG tablet Take 1 tablet (8 mg total) by mouth every 8 (eight) hours as needed for nausea.  30 tablet  1   No current facility-administered medications for this visit.     Allergies: No Known Allergies  Past Medical History, Surgical history, Social history, and Family History were reviewed and updated.    Physical Exam:  Blood pressure (!) 147/82, pulse 75, temperature 97.8 F (36.6 C), temperature source Oral, resp. rate 17, height 5' 4" (1.626 m), weight 165 lb 12.8 oz (75.2 kg), SpO2 100 %. ECOG: 0   General appearance: Alert, awake woman without distress. Oropharynx: No ulcers or lesions. Neck: no adenopathy   Lymph nodes: Cervical, supraclavicular, and axillary nodes normal. Heart:regular rate and rhythm, S1, S2 normal, no murmur, rubs or gallops. Lung:chest clear, no wheezing,  rales, normal symmetric air entry.  Chest wall examination: Revealed Port-A-Cath site without ecchymosis or drainage. Abdomen: soft, non-tender, without masses or nodularity noted. No shifting dullness or ascites. Neurological: No new neurological deficits noted.  CBC    Component Value Date/Time   WBC 3.8 (L) 03/03/2017 0910   WBC 4.0 11/20/2015 0735   RBC 3.71 03/03/2017 0910   RBC 3.95 11/20/2015 0735   HGB 11.9 03/03/2017 0910   HCT 36.3 03/03/2017 0910   PLT 182 03/03/2017 0910   MCV 97.8 03/03/2017 0910   MCH 32.1 03/03/2017 0910   MCH 30.6 11/20/2015 0735   MCHC 32.8 03/03/2017 0910   MCHC 34.1 11/20/2015 0735   RDW 14.3 03/03/2017 0910   LYMPHSABS 1.5 03/03/2017 0910   MONOABS 0.3 03/03/2017 0910   EOSABS 0.1 03/03/2017 0910   BASOSABS 0.0 03/03/2017 0910        Impression and Plan:  66 year old woman with the following issues:   1. Advanced Colon cancer. She presented with a large right cecal mass causing obstruction and abscess and appendicitis. After surgical resection she received 12 cycles of chemotherapy with PET/CT scan on 09/12/2013 showed no residual disease actually in the liver that is hypermetabolic.  She is status post liver directed therapy for metastatic disease and to the liver stream and was given in June 2016.  Her PET/CT scan on 11/08/2015 showed progressive omental and peritoneal metastasis. Biopsy proven to be adenocarcinoma of the colon that is KRAS wild-type but NRAS mutated.  She is S/P salvage FOLFIRI and a Avastin that is well-tolerated. CPT-11 was removed for better tolerance after cycle 10.  CT scan obtain on 12/26/2017continues to show reasonable response to therapy with decrease in the size of her omental metastasis. She declined primary surgical therapy that included omentectomy.  She is currently receiving maintenance chemotherapy with 5-FU any Avastin.   CT scan on 12/15/2016 ontinues to show stable disease.   The plan is to  continue with maintenance therapy including a 5-FU, leucovorin and a Avastin. Risks and benefits of continuing this treatment were reviewed today and she is agreeable to continue.  2. Intravenous access. Port-A-Cath continues without complications.  3. Neuropathy: Unchanged at this time. Not interfere with function.  4. Antiemetics: Compazine is effective in managing her nausea. Prescription for Ativan was given to the patient previously for anticipatory nausea..  5. Followup: Will be every 2 weeks for maintenance chemotherapy.    Zola Button MD 03/03/17

## 2017-03-03 NOTE — Patient Instructions (Signed)
[place] Discharge Instructions for Patients Receiving Chemotherapy  Today you received the following chemotherapy agents: Avastin, Leucovorin and Fluorouracil.  To help prevent nausea and vomiting after your treatment, we encourage you to take your nausea medication: Compazine. Take one every 6 hours as needed. Take Ativan for nausea not relieved by Compazine.  If you develop nausea and vomiting that is not controlled by your nausea medication, call the clinic.   BELOW ARE SYMPTOMS THAT SHOULD BE REPORTED IMMEDIATELY:  *FEVER GREATER THAN 100.5 F  *CHILLS WITH OR WITHOUT FEVER  NAUSEA AND VOMITING THAT IS NOT CONTROLLED WITH YOUR NAUSEA MEDICATION  *UNUSUAL SHORTNESS OF BREATH  *UNUSUAL BRUISING OR BLEEDING  TENDERNESS IN MOUTH AND THROAT WITH OR WITHOUT PRESENCE OF ULCERS  *URINARY PROBLEMS  *BOWEL PROBLEMS  UNUSUAL RASH Items with * indicate a potential emergency and should be followed up as soon as possible.  Feel free to call the clinic should you have any questions or concerns. The clinic phone number is [phone].  Please show the [place] at check-in to the Emergency Department and triage nurse.

## 2017-03-05 ENCOUNTER — Ambulatory Visit (HOSPITAL_BASED_OUTPATIENT_CLINIC_OR_DEPARTMENT_OTHER): Payer: Medicare Other

## 2017-03-05 DIAGNOSIS — C180 Malignant neoplasm of cecum: Secondary | ICD-10-CM

## 2017-03-05 DIAGNOSIS — Z95828 Presence of other vascular implants and grafts: Secondary | ICD-10-CM

## 2017-03-05 MED ORDER — HEPARIN SOD (PORK) LOCK FLUSH 100 UNIT/ML IV SOLN
500.0000 [IU] | Freq: Once | INTRAVENOUS | Status: AC | PRN
  Administered 2017-03-05: 500 [IU] via INTRAVENOUS
  Filled 2017-03-05: qty 5

## 2017-03-05 MED ORDER — SODIUM CHLORIDE 0.9 % IJ SOLN
10.0000 mL | INTRAMUSCULAR | Status: DC | PRN
  Administered 2017-03-05: 10 mL via INTRAVENOUS
  Filled 2017-03-05: qty 10

## 2017-03-05 NOTE — Patient Instructions (Signed)

## 2017-03-17 ENCOUNTER — Ambulatory Visit (HOSPITAL_BASED_OUTPATIENT_CLINIC_OR_DEPARTMENT_OTHER): Payer: Medicare Other

## 2017-03-17 ENCOUNTER — Telehealth: Payer: Self-pay | Admitting: Oncology

## 2017-03-17 ENCOUNTER — Other Ambulatory Visit (HOSPITAL_BASED_OUTPATIENT_CLINIC_OR_DEPARTMENT_OTHER): Payer: Medicare Other

## 2017-03-17 ENCOUNTER — Ambulatory Visit: Payer: Medicare Other

## 2017-03-17 ENCOUNTER — Ambulatory Visit (HOSPITAL_BASED_OUTPATIENT_CLINIC_OR_DEPARTMENT_OTHER): Payer: Medicare Other | Admitting: Oncology

## 2017-03-17 VITALS — BP 124/77

## 2017-03-17 VITALS — BP 150/87 | HR 74 | Temp 98.0°F | Resp 18 | Ht 64.0 in | Wt 164.4 lb

## 2017-03-17 DIAGNOSIS — C786 Secondary malignant neoplasm of retroperitoneum and peritoneum: Secondary | ICD-10-CM | POA: Diagnosis not present

## 2017-03-17 DIAGNOSIS — C180 Malignant neoplasm of cecum: Secondary | ICD-10-CM

## 2017-03-17 DIAGNOSIS — K6389 Other specified diseases of intestine: Secondary | ICD-10-CM

## 2017-03-17 DIAGNOSIS — C787 Secondary malignant neoplasm of liver and intrahepatic bile duct: Secondary | ICD-10-CM | POA: Diagnosis not present

## 2017-03-17 DIAGNOSIS — K769 Liver disease, unspecified: Secondary | ICD-10-CM

## 2017-03-17 DIAGNOSIS — C189 Malignant neoplasm of colon, unspecified: Secondary | ICD-10-CM

## 2017-03-17 DIAGNOSIS — Z95828 Presence of other vascular implants and grafts: Secondary | ICD-10-CM

## 2017-03-17 DIAGNOSIS — Z5111 Encounter for antineoplastic chemotherapy: Secondary | ICD-10-CM | POA: Diagnosis not present

## 2017-03-17 DIAGNOSIS — Z5112 Encounter for antineoplastic immunotherapy: Secondary | ICD-10-CM

## 2017-03-17 LAB — CBC WITH DIFFERENTIAL/PLATELET
BASO%: 0.5 % (ref 0.0–2.0)
BASOS ABS: 0 10*3/uL (ref 0.0–0.1)
EOS ABS: 0.1 10*3/uL (ref 0.0–0.5)
EOS%: 1.6 % (ref 0.0–7.0)
HCT: 38.2 % (ref 34.8–46.6)
HGB: 12.7 g/dL (ref 11.6–15.9)
LYMPH#: 1.6 10*3/uL (ref 0.9–3.3)
LYMPH%: 31.8 % (ref 14.0–49.7)
MCH: 32.6 pg (ref 25.1–34.0)
MCHC: 33.3 g/dL (ref 31.5–36.0)
MCV: 98 fL (ref 79.5–101.0)
MONO#: 0.5 10*3/uL (ref 0.1–0.9)
MONO%: 9.9 % (ref 0.0–14.0)
NEUT#: 2.8 10*3/uL (ref 1.5–6.5)
NEUT%: 56.2 % (ref 38.4–76.8)
Platelets: 196 10*3/uL (ref 145–400)
RBC: 3.89 10*6/uL (ref 3.70–5.45)
RDW: 15.3 % — ABNORMAL HIGH (ref 11.2–14.5)
WBC: 5 10*3/uL (ref 3.9–10.3)

## 2017-03-17 LAB — CEA (IN HOUSE-CHCC): CEA (CHCC-IN HOUSE): 3.69 ng/mL (ref 0.00–5.00)

## 2017-03-17 LAB — COMPREHENSIVE METABOLIC PANEL
ALT: 8 U/L (ref 0–55)
AST: 15 U/L (ref 5–34)
Albumin: 3.3 g/dL — ABNORMAL LOW (ref 3.5–5.0)
Alkaline Phosphatase: 80 U/L (ref 40–150)
Anion Gap: 7 mEq/L (ref 3–11)
BUN: 19.9 mg/dL (ref 7.0–26.0)
CHLORIDE: 108 meq/L (ref 98–109)
CO2: 24 mEq/L (ref 22–29)
Calcium: 9.3 mg/dL (ref 8.4–10.4)
Creatinine: 0.9 mg/dL (ref 0.6–1.1)
EGFR: 78 mL/min/{1.73_m2} — ABNORMAL LOW (ref >90)
GLUCOSE: 88 mg/dL (ref 70–140)
POTASSIUM: 3.8 meq/L (ref 3.5–5.1)
SODIUM: 139 meq/L (ref 136–145)
Total Bilirubin: 0.6 mg/dL (ref 0.20–1.20)
Total Protein: 7.2 g/dL (ref 6.4–8.3)

## 2017-03-17 MED ORDER — PALONOSETRON HCL INJECTION 0.25 MG/5ML
0.2500 mg | Freq: Once | INTRAVENOUS | Status: AC
  Administered 2017-03-17: 0.25 mg via INTRAVENOUS

## 2017-03-17 MED ORDER — LORAZEPAM 2 MG/ML IJ SOLN
INTRAMUSCULAR | Status: AC
  Filled 2017-03-17: qty 1

## 2017-03-17 MED ORDER — LORAZEPAM 1 MG PO TABS
ORAL_TABLET | ORAL | Status: AC
  Filled 2017-03-17: qty 1

## 2017-03-17 MED ORDER — SODIUM CHLORIDE 0.9 % IV SOLN
5.0000 mg/kg | Freq: Once | INTRAVENOUS | Status: AC
  Administered 2017-03-17: 400 mg via INTRAVENOUS
  Filled 2017-03-17: qty 16

## 2017-03-17 MED ORDER — DEXAMETHASONE SODIUM PHOSPHATE 10 MG/ML IJ SOLN
10.0000 mg | Freq: Once | INTRAMUSCULAR | Status: AC
  Administered 2017-03-17: 10 mg via INTRAVENOUS

## 2017-03-17 MED ORDER — FLUOROURACIL CHEMO INJECTION 2.5 GM/50ML
400.0000 mg/m2 | Freq: Once | INTRAVENOUS | Status: AC
  Administered 2017-03-17: 750 mg via INTRAVENOUS
  Filled 2017-03-17: qty 15

## 2017-03-17 MED ORDER — SODIUM CHLORIDE 0.9 % IJ SOLN
10.0000 mL | INTRAMUSCULAR | Status: DC | PRN
  Administered 2017-03-17: 10 mL via INTRAVENOUS
  Filled 2017-03-17: qty 10

## 2017-03-17 MED ORDER — SODIUM CHLORIDE 0.9 % IV SOLN
Freq: Once | INTRAVENOUS | Status: AC
  Administered 2017-03-17: 10:00:00 via INTRAVENOUS

## 2017-03-17 MED ORDER — PALONOSETRON HCL INJECTION 0.25 MG/5ML
INTRAVENOUS | Status: AC
  Filled 2017-03-17: qty 5

## 2017-03-17 MED ORDER — SODIUM CHLORIDE 0.9 % IV SOLN
2400.0000 mg/m2 | INTRAVENOUS | Status: DC
  Administered 2017-03-17: 4500 mg via INTRAVENOUS
  Filled 2017-03-17: qty 90

## 2017-03-17 MED ORDER — LEUCOVORIN CALCIUM INJECTION 350 MG
400.0000 mg/m2 | Freq: Once | INTRAVENOUS | Status: AC
  Administered 2017-03-17: 752 mg via INTRAVENOUS
  Filled 2017-03-17: qty 37.6

## 2017-03-17 MED ORDER — LORAZEPAM 1 MG PO TABS
0.5000 mg | ORAL_TABLET | Freq: Once | ORAL | Status: AC
  Administered 2017-03-17: 0.5 mg via ORAL

## 2017-03-17 MED ORDER — DEXAMETHASONE SODIUM PHOSPHATE 10 MG/ML IJ SOLN
INTRAMUSCULAR | Status: AC
  Filled 2017-03-17: qty 1

## 2017-03-17 NOTE — Progress Notes (Signed)
Hematology and Oncology Follow Up Visit  RASHEEDA MULVEHILL [id number] 1950-12-17    03/17/17   Principle Diagnosis: 66 year old woman diagnosed with colon cancer in June 2014. She had presented with an abdominal pain and found to have a cecal mass resulting in obstruction and appendicitis. She had Likely has stage IV disease with a liver mass.    Prior Therapy:  She is status post laparoscopic laparotomy, evacuation of a pelvic abscess and right hemicolectomy with ileocolonic anastomosis done on December 09, 2012. The pathology showed an invasive, well- differentiated colorectal adenocarcinoma. Tumor invades through the muscularis propria, with 2/18 lymph nodes involved, with the pathological staging T3 N1b disease. Her tumor was found to be microsatellite stable. KRAS wild type.   She is also status post Port-A-Cath insertion that was done on 01/05/2013.  FOLFOX chemotherapy started 01/18/2013. Avastin to be added with cycle 2 on 02/01/2013. She is S/P 12 cycles completed in 05/2013.   She is a status post microwave ablation of metastatic lesion in the posterior segment of the right lobe of the liver completed on 09/28/2014 and repeated on 11/30/2014.  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   Current therapy:  FOLFIRI and a Avastin salvage therapy started on 12/10/2015.  She has received 5-FU, leucovorin and a Avastin only starting with cycle 10.   Treatment was held after 11/18/2016 and resumed on 02/03/2017. She is here to receive the next treatment.  Interim History:  Ms. Mccaig presents today for a followup visit. Since her last visit, she reports no recent complaints. She denied any recent complications related to chemotherapy. She denied any nausea, vomiting but did have mild diarrhea that is manageable. She denied any abdominal pain, early satiety or constipation. She denied any worsening peripheral neuropathy  versus fatigue. She is transitioned to part-time work and concentrating on exercising and monitoring her diet.  She is not report any headaches, blurry vision, syncope or seizures. She does not report any fevers, chills or sweats. She does not report any chest pain, palpitation or orthopnea. She does not report any wheezing, hemoptysis or hematemesis. She does not report any frequency urgency or hesitancy. Does not report any lymphadenopathy or petechiae. Rest of her review of systems unremarkable.   Medications: I have reviewed the patient's current medications.  Current Outpatient Prescriptions  Medication Sig Dispense Refill  . acetaminophen (TYLENOL) 500 MG tablet Take 500 mg by mouth every 6 (six) hours as needed for pain.      . diphenhydrAMINE (BENADRYL) 25 mg capsule Take 25-50 mg by mouth daily as needed for itching. For allergic reaction      . lidocaine-prilocaine (EMLA) cream Apply 1 application topically as needed. Apply approx 1/2 tsp to skin over port, prior to chemotherapy treatments  1 kit  3  . Multiple Vitamin (MULTIVITAMIN) tablet Take 1 tablet by mouth daily.      . ondansetron (ZOFRAN) 8 MG tablet Take 1 tablet (8 mg total) by mouth every 8 (eight) hours as needed for nausea.  30 tablet  1   No current facility-administered medications for this visit.     Allergies: No Known Allergies  Past Medical History, Surgical history, Social history, and Family History were reviewed and updated.    Physical Exam:  Blood pressure (!) 150/87, pulse 74, temperature 98 F (36.7 C), temperature source Oral, resp. rate 18, height '5\' 4"'$  (1.626 m), weight 164 lb 6.4 oz (74.6 kg), SpO2  100 %. ECOG: 0 General appearance: Well-appearing woman without distress. Oropharynx: No oral ulcers or thrush. Neck: no adenopathy   Lymph nodes: Cervical, supraclavicular, and axillary nodes normal. Heart:regular rate and rhythm, S1, S2 normal, no murmur, rubs or gallops. Lung:chest clear, no  wheezing, rales, normal symmetric air entry.  Chest wall examination: Revealed Port-A-Cath site without ecchymosis or drainage. Abdomen: soft, non-tender, without masses or nodularity noted. No rebound or guarding. Neurological: No new neurological deficits noted.  CBC    Component Value Date/Time   WBC 5.0 03/17/2017 0804   WBC 4.0 11/20/2015 0735   RBC 3.89 03/17/2017 0804   RBC 3.95 11/20/2015 0735   HGB 12.7 03/17/2017 0804   HCT 38.2 03/17/2017 0804   PLT 196 03/17/2017 0804   MCV 98.0 03/17/2017 0804   MCH 32.6 03/17/2017 0804   MCH 30.6 11/20/2015 0735   MCHC 33.3 03/17/2017 0804   MCHC 34.1 11/20/2015 0735   RDW 15.3 (H) 03/17/2017 0804   LYMPHSABS 1.6 03/17/2017 0804   MONOABS 0.5 03/17/2017 0804   EOSABS 0.1 03/17/2017 0804   BASOSABS 0.0 03/17/2017 0804        Impression and Plan:  66 year old woman with the following issues:   1. Advanced Colon cancer. She presented with a large right cecal mass causing obstruction and abscess and appendicitis. After surgical resection she received 12 cycles of chemotherapy with PET/CT scan on 09/12/2013 showed no residual disease actually in the liver that is hypermetabolic.  She is status post liver directed therapy for metastatic disease and to the liver stream and was given in June 2016.  Her PET/CT scan on 11/08/2015 showed progressive omental and peritoneal metastasis. Biopsy proven to be adenocarcinoma of the colon that is KRAS wild-type but NRAS mutated.  She is S/P salvage FOLFIRI and a Avastin that is well-tolerated. CPT-11 was removed for better tolerance after cycle 10.  CT scan obtain on 06/16/2016 showed reasonable response to therapy with decrease in the size of her omental metastasis. She declined primary surgical therapy that included omentectomy.  She is currently receiving maintenance chemotherapy with 5-FU any Avastin.   CT scan on 12/15/2016 ontinues to show stable disease.   Risks and benefits of  continuing maintenance 5-FU any Avastin was discussed today she is agreeable to continue. We will repeat imaging studies in December 2018.  2. Intravenous access. Port-A-Cath remains in use without complications.  3. Neuropathy: Unchanged at this time. Not interfere with function.  4. Antiemetics: Compazine is effective in managing her nausea.   5. Followup: Will be every 2 weeks for maintenance chemotherapy.    Zola Button MD 03/17/17

## 2017-03-17 NOTE — Telephone Encounter (Signed)
Scheduled appt per 9/26 los - patient to get new schedule in the treatment area.  

## 2017-03-17 NOTE — Patient Instructions (Signed)
[facility] Discharge Instructions for Patients Receiving Chemotherapy  Today you received the following chemotherapy agents Avastin,Leucovorin,Florouracil  To help prevent nausea and vomiting after your treatment, we encourage you to take your nausea medication as directed.   If you develop nausea and vomiting that is not controlled by your nausea medication, call the clinic.   BELOW ARE SYMPTOMS THAT SHOULD BE REPORTED IMMEDIATELY:  *FEVER GREATER THAN 100.5 F  *CHILLS WITH OR WITHOUT FEVER  NAUSEA AND VOMITING THAT IS NOT CONTROLLED WITH YOUR NAUSEA MEDICATION  *UNUSUAL SHORTNESS OF BREATH  *UNUSUAL BRUISING OR BLEEDING  TENDERNESS IN MOUTH AND THROAT WITH OR WITHOUT PRESENCE OF ULCERS  *URINARY PROBLEMS  *BOWEL PROBLEMS  UNUSUAL RASH Items with * indicate a potential emergency and should be followed up as soon as possible.  Feel free to call the clinic should you have any questions or concerns. The clinic phone number is [phone].  Please show the [place] at check-in to the Emergency Department and triage nurse.

## 2017-03-19 ENCOUNTER — Ambulatory Visit (HOSPITAL_BASED_OUTPATIENT_CLINIC_OR_DEPARTMENT_OTHER): Payer: Medicare Other

## 2017-03-19 VITALS — BP 131/78 | HR 66 | Temp 98.1°F | Resp 18

## 2017-03-19 DIAGNOSIS — C180 Malignant neoplasm of cecum: Secondary | ICD-10-CM

## 2017-03-19 DIAGNOSIS — C189 Malignant neoplasm of colon, unspecified: Secondary | ICD-10-CM

## 2017-03-19 DIAGNOSIS — K769 Liver disease, unspecified: Secondary | ICD-10-CM

## 2017-03-19 DIAGNOSIS — K6389 Other specified diseases of intestine: Secondary | ICD-10-CM

## 2017-03-19 MED ORDER — SODIUM CHLORIDE 0.9% FLUSH
10.0000 mL | INTRAVENOUS | Status: DC | PRN
  Administered 2017-03-19: 10 mL
  Filled 2017-03-19: qty 10

## 2017-03-19 MED ORDER — HEPARIN SOD (PORK) LOCK FLUSH 100 UNIT/ML IV SOLN
500.0000 [IU] | Freq: Once | INTRAVENOUS | Status: AC | PRN
  Administered 2017-03-19: 500 [IU]
  Filled 2017-03-19: qty 5

## 2017-03-19 NOTE — Progress Notes (Signed)
Pt here for pump D/C.  5FU had been infusing via CADD.  Approximately 50-75 ml left in bag but pump reports .2 ml left.  Pt reports that cartridge came out & husband put back in & it was only out @ 5".  Discussed with Dr Alen Blew & order received to d/c pump despite extra fluid.

## 2017-03-31 ENCOUNTER — Ambulatory Visit (HOSPITAL_BASED_OUTPATIENT_CLINIC_OR_DEPARTMENT_OTHER): Payer: Medicare Other

## 2017-03-31 ENCOUNTER — Telehealth: Payer: Self-pay | Admitting: Oncology

## 2017-03-31 ENCOUNTER — Ambulatory Visit: Payer: Medicare Other

## 2017-03-31 ENCOUNTER — Ambulatory Visit (HOSPITAL_BASED_OUTPATIENT_CLINIC_OR_DEPARTMENT_OTHER): Payer: Medicare Other | Admitting: Oncology

## 2017-03-31 ENCOUNTER — Other Ambulatory Visit (HOSPITAL_BASED_OUTPATIENT_CLINIC_OR_DEPARTMENT_OTHER): Payer: Medicare Other

## 2017-03-31 VITALS — BP 128/91 | HR 79 | Temp 97.5°F | Resp 17 | Ht 64.0 in | Wt 164.0 lb

## 2017-03-31 VITALS — BP 120/81

## 2017-03-31 DIAGNOSIS — C786 Secondary malignant neoplasm of retroperitoneum and peritoneum: Secondary | ICD-10-CM

## 2017-03-31 DIAGNOSIS — C180 Malignant neoplasm of cecum: Secondary | ICD-10-CM

## 2017-03-31 DIAGNOSIS — C787 Secondary malignant neoplasm of liver and intrahepatic bile duct: Secondary | ICD-10-CM

## 2017-03-31 DIAGNOSIS — C189 Malignant neoplasm of colon, unspecified: Secondary | ICD-10-CM

## 2017-03-31 DIAGNOSIS — K769 Liver disease, unspecified: Secondary | ICD-10-CM

## 2017-03-31 DIAGNOSIS — Z5111 Encounter for antineoplastic chemotherapy: Secondary | ICD-10-CM

## 2017-03-31 DIAGNOSIS — Z5112 Encounter for antineoplastic immunotherapy: Secondary | ICD-10-CM

## 2017-03-31 DIAGNOSIS — K6389 Other specified diseases of intestine: Secondary | ICD-10-CM

## 2017-03-31 LAB — CBC WITH DIFFERENTIAL/PLATELET
BASO%: 0.2 % (ref 0.0–2.0)
BASOS ABS: 0 10*3/uL (ref 0.0–0.1)
EOS%: 1.5 % (ref 0.0–7.0)
Eosinophils Absolute: 0.1 10*3/uL (ref 0.0–0.5)
HCT: 37.9 % (ref 34.8–46.6)
HEMOGLOBIN: 12.7 g/dL (ref 11.6–15.9)
LYMPH#: 1.4 10*3/uL (ref 0.9–3.3)
LYMPH%: 29.2 % (ref 14.0–49.7)
MCH: 32.7 pg (ref 25.1–34.0)
MCHC: 33.5 g/dL (ref 31.5–36.0)
MCV: 97.7 fL (ref 79.5–101.0)
MONO#: 0.5 10*3/uL (ref 0.1–0.9)
MONO%: 11.4 % (ref 0.0–14.0)
NEUT%: 57.7 % (ref 38.4–76.8)
NEUTROS ABS: 2.7 10*3/uL (ref 1.5–6.5)
Platelets: 195 10*3/uL (ref 145–400)
RBC: 3.88 10*6/uL (ref 3.70–5.45)
RDW: 15.8 % — ABNORMAL HIGH (ref 11.2–14.5)
WBC: 4.7 10*3/uL (ref 3.9–10.3)

## 2017-03-31 LAB — COMPREHENSIVE METABOLIC PANEL
ALBUMIN: 3.5 g/dL (ref 3.5–5.0)
ALK PHOS: 65 U/L (ref 40–150)
ALT: 10 U/L (ref 0–55)
AST: 16 U/L (ref 5–34)
Anion Gap: 8 mEq/L (ref 3–11)
BILIRUBIN TOTAL: 1.06 mg/dL (ref 0.20–1.20)
BUN: 16.7 mg/dL (ref 7.0–26.0)
CO2: 25 meq/L (ref 22–29)
CREATININE: 1 mg/dL (ref 0.6–1.1)
Calcium: 9.5 mg/dL (ref 8.4–10.4)
Chloride: 107 mEq/L (ref 98–109)
EGFR: >60 mL/min/{1.73_m2} (ref >60)
GLUCOSE: 96 mg/dL (ref 70–140)
Potassium: 3.9 mEq/L (ref 3.5–5.1)
SODIUM: 140 meq/L (ref 136–145)
TOTAL PROTEIN: 7.5 g/dL (ref 6.4–8.3)

## 2017-03-31 LAB — CEA (IN HOUSE-CHCC): CEA (CHCC-In House): 3.46 ng/mL (ref 0.00–5.00)

## 2017-03-31 MED ORDER — LORAZEPAM 1 MG PO TABS
0.5000 mg | ORAL_TABLET | Freq: Once | ORAL | Status: AC
  Administered 2017-03-31: 0.5 mg via ORAL

## 2017-03-31 MED ORDER — PALONOSETRON HCL INJECTION 0.25 MG/5ML
INTRAVENOUS | Status: AC
  Filled 2017-03-31: qty 5

## 2017-03-31 MED ORDER — SODIUM CHLORIDE 0.9 % IV SOLN
Freq: Once | INTRAVENOUS | Status: AC
  Administered 2017-03-31: 10:00:00 via INTRAVENOUS

## 2017-03-31 MED ORDER — PALONOSETRON HCL INJECTION 0.25 MG/5ML
0.2500 mg | Freq: Once | INTRAVENOUS | Status: AC
  Administered 2017-03-31: 0.25 mg via INTRAVENOUS

## 2017-03-31 MED ORDER — DEXAMETHASONE SODIUM PHOSPHATE 10 MG/ML IJ SOLN
INTRAMUSCULAR | Status: AC
  Filled 2017-03-31: qty 1

## 2017-03-31 MED ORDER — FLUOROURACIL CHEMO INJECTION 2.5 GM/50ML
400.0000 mg/m2 | Freq: Once | INTRAVENOUS | Status: AC
  Administered 2017-03-31: 750 mg via INTRAVENOUS
  Filled 2017-03-31: qty 15

## 2017-03-31 MED ORDER — SODIUM CHLORIDE 0.9 % IV SOLN
5.0000 mg/kg | Freq: Once | INTRAVENOUS | Status: AC
  Administered 2017-03-31: 400 mg via INTRAVENOUS
  Filled 2017-03-31: qty 16

## 2017-03-31 MED ORDER — DEXAMETHASONE SODIUM PHOSPHATE 10 MG/ML IJ SOLN
10.0000 mg | Freq: Once | INTRAMUSCULAR | Status: AC
  Administered 2017-03-31: 10 mg via INTRAVENOUS

## 2017-03-31 MED ORDER — LORAZEPAM 1 MG PO TABS
ORAL_TABLET | ORAL | Status: AC
  Filled 2017-03-31: qty 1

## 2017-03-31 MED ORDER — DEXTROSE 5 % IV SOLN
400.0000 mg/m2 | Freq: Once | INTRAVENOUS | Status: AC
  Administered 2017-03-31: 752 mg via INTRAVENOUS
  Filled 2017-03-31: qty 37.6

## 2017-03-31 MED ORDER — SODIUM CHLORIDE 0.9 % IV SOLN
2400.0000 mg/m2 | INTRAVENOUS | Status: DC
  Administered 2017-03-31: 4500 mg via INTRAVENOUS
  Filled 2017-03-31: qty 90

## 2017-03-31 NOTE — Patient Instructions (Signed)
[facility] Discharge Instructions for Patients Receiving Chemotherapy  Today you received the following chemotherapy agents avastin/leucovorin/irinotecan/florouracil   To help prevent nausea and vomiting after your treatment, we encourage you to take your nausea medication as directed  If you develop nausea and vomiting that is not controlled by your nausea medication, call the clinic.   BELOW ARE SYMPTOMS THAT SHOULD BE REPORTED IMMEDIATELY:  *FEVER GREATER THAN 100.5 F  *CHILLS WITH OR WITHOUT FEVER  NAUSEA AND VOMITING THAT IS NOT CONTROLLED WITH YOUR NAUSEA MEDICATION  *UNUSUAL SHORTNESS OF BREATH  *UNUSUAL BRUISING OR BLEEDING  TENDERNESS IN MOUTH AND THROAT WITH OR WITHOUT PRESENCE OF ULCERS  *URINARY PROBLEMS  *BOWEL PROBLEMS  UNUSUAL RASH Items with * indicate a potential emergency and should be followed up as soon as possible.  Feel free to call the clinic you have any questions or concerns. The clinic phone number is [phone].  

## 2017-03-31 NOTE — Telephone Encounter (Signed)
Scheduled appt per 10/10 los - Gave patient AVS and calender per los.  

## 2017-03-31 NOTE — Progress Notes (Signed)
Hematology and Oncology Follow Up Visit  Margaret Elliott [id number] September 18, 1950    03/31/17   Principle Diagnosis: 66 year old woman diagnosed with colon cancer in June 2014. She had presented with an abdominal pain and found to have a cecal mass resulting in obstruction and appendicitis. She had Likely has stage IV disease with a liver mass.    Prior Therapy:  She is status post laparoscopic laparotomy, evacuation of a pelvic abscess and right hemicolectomy with ileocolonic anastomosis done on December 09, 2012. The pathology showed an invasive, well- differentiated colorectal adenocarcinoma. Tumor invades through the muscularis propria, with 2/18 lymph nodes involved, with the pathological staging T3 N1b disease. Her tumor was found to be microsatellite stable. KRAS wild type.   She is also status post Port-A-Cath insertion that was done on 01/05/2013.  FOLFOX chemotherapy started 01/18/2013. Avastin to be added with cycle 2 on 02/01/2013. She is S/P 12 cycles completed in 05/2013.   She is a status post microwave ablation of metastatic lesion in the posterior segment of the right lobe of the liver completed on 09/28/2014 and repeated on 11/30/2014.  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   Current therapy:  FOLFIRI and a Avastin salvage therapy started on 12/10/2015.  She has received 5-FU, leucovorin and a Avastin only starting with cycle 10.   Treatment was held after 11/18/2016 and resumed on 02/03/2017.   Interim History:  Margaret Elliott presents today for a followup visit. Since her last visit, she reports slight increased fatigue with the last treatment. She did not receive the full 5-FU infusion because of malfunctioning pump. She denied any recent complications related to chemotherapy. She denied any nausea, vomiting but did have mild diarrhea that is manageable. She did report mild abdominal pain in the morning  which has resolved at this time. She denied early satiety or constipation. She denied any worsening peripheral neuropathy. She denied any hematochezia or melena.  She is not report any headaches, blurry vision, syncope or seizures. She does not report any fevers, chills or sweats. She does not report any chest pain, palpitation or orthopnea. She does not report any wheezing, hemoptysis or hematemesis. She does not report any frequency urgency or hesitancy. Does not report any lymphadenopathy or petechiae. Rest of her review of systems unremarkable.   Medications: I have reviewed the patient's current medications.  Current Outpatient Prescriptions  Medication Sig Dispense Refill  . acetaminophen (TYLENOL) 500 MG tablet Take 500 mg by mouth every 6 (six) hours as needed for pain.      . diphenhydrAMINE (BENADRYL) 25 mg capsule Take 25-50 mg by mouth daily as needed for itching. For allergic reaction      . lidocaine-prilocaine (EMLA) cream Apply 1 application topically as needed. Apply approx 1/2 tsp to skin over port, prior to chemotherapy treatments  1 kit  3  . Multiple Vitamin (MULTIVITAMIN) tablet Take 1 tablet by mouth daily.      . ondansetron (ZOFRAN) 8 MG tablet Take 1 tablet (8 mg total) by mouth every 8 (eight) hours as needed for nausea.  30 tablet  1   No current facility-administered medications for this visit.     Allergies: No Known Allergies  Past Medical History, Surgical history, Social history, and Family History were reviewed and updated.    Physical Exam:  Blood pressure (!) 128/91, pulse 79, temperature (!) 97.5 F (36.4 C), temperature source Oral, resp. rate 17, height  _0  (1.626 m), weight 164 lb (74.4 kg), SpO2 100 %. ECOG: 0 General appearance: Alert, awake woman without distress. Oropharynx: No oral ulcers or thrush. Neck: no adenopathy  or masses. Lymph nodes: Cervical, supraclavicular, and axillary nodes normal. Heart:regular rate and rhythm, S1, S2  normal, no murmur, rubs or gallops. Lung:chest clear, no wheezing, rales, normal symmetric air entry.  Chest wall examination: Revealed Port-A-Cath site without ecchymosis or drainage. Abdomen: soft, non-tender, without masses or nodularity noted. No shifting dullness or ascites. Neurological: No new neurological deficits noted.  CBC    Component Value Date/Time   WBC 4.7 03/31/2017 0818   WBC 4.0 11/20/2015 0735   RBC 3.88 03/31/2017 0818   RBC 3.95 11/20/2015 0735   HGB 12.7 03/31/2017 0818   HCT 37.9 03/31/2017 0818   PLT 195 03/31/2017 0818   MCV 97.7 03/31/2017 0818   MCH 32.7 03/31/2017 0818   MCH 30.6 11/20/2015 0735   MCHC 33.5 03/31/2017 0818   MCHC 34.1 11/20/2015 0735   RDW 15.8 (H) 03/31/2017 0818   LYMPHSABS 1.4 03/31/2017 0818   MONOABS 0.5 03/31/2017 0818   EOSABS 0.1 03/31/2017 0818   BASOSABS 0.0 03/31/2017 0818        Impression and Plan:  66 year old woman with the following issues:   1. Advanced Colon cancer. She presented with a large right cecal mass causing obstruction and abscess and appendicitis. After surgical resection she received 12 cycles of chemotherapy with PET/CT scan on 09/12/2013 showed no residual disease actually in the liver that is hypermetabolic.  She is status post liver directed therapy for metastatic disease and to the liver stream and was given in June 2016.  Her PET/CT scan on 11/08/2015 showed progressive omental and peritoneal metastasis. Biopsy proven to be adenocarcinoma of the colon that is KRAS wild-type but NRAS mutated.  She is S/P salvage FOLFIRI and a Avastin that is well-tolerated. CPT-11 was removed for better tolerance after cycle 10.  CT scan obtain on 06/16/2016 showed reasonable response to therapy with decrease in the size of her omental metastasis. She declined primary surgical therapy that included omentectomy.  She is currently receiving maintenance chemotherapy with 5-FU any Avastin.   CT scan on  12/15/2016 ontinues to show stable disease.   The plan is to continue with the same maintenance regimen at this time and consider treatment break on December 2018.  2. Intravenous access. Port-A-Cath remains in use without complications.  3. Neuropathy: Remains stable at this time.  4. Antiemetics: Compazine is effective in managing her nausea.   5. Followup: Will be every 2 weeks for maintenance chemotherapy.    Zola Button MD 03/31/17

## 2017-04-02 ENCOUNTER — Ambulatory Visit (HOSPITAL_BASED_OUTPATIENT_CLINIC_OR_DEPARTMENT_OTHER): Payer: Medicare Other

## 2017-04-02 VITALS — BP 131/86 | HR 86 | Temp 97.7°F | Resp 16

## 2017-04-02 DIAGNOSIS — K6389 Other specified diseases of intestine: Secondary | ICD-10-CM

## 2017-04-02 DIAGNOSIS — K769 Liver disease, unspecified: Secondary | ICD-10-CM

## 2017-04-02 DIAGNOSIS — C180 Malignant neoplasm of cecum: Secondary | ICD-10-CM

## 2017-04-02 DIAGNOSIS — Z452 Encounter for adjustment and management of vascular access device: Secondary | ICD-10-CM

## 2017-04-02 DIAGNOSIS — C189 Malignant neoplasm of colon, unspecified: Secondary | ICD-10-CM

## 2017-04-02 MED ORDER — SODIUM CHLORIDE 0.9% FLUSH
10.0000 mL | INTRAVENOUS | Status: DC | PRN
  Administered 2017-04-02: 10 mL
  Filled 2017-04-02: qty 10

## 2017-04-02 MED ORDER — HEPARIN SOD (PORK) LOCK FLUSH 100 UNIT/ML IV SOLN
500.0000 [IU] | Freq: Once | INTRAVENOUS | Status: AC | PRN
  Administered 2017-04-02: 500 [IU]
  Filled 2017-04-02: qty 5

## 2017-04-14 ENCOUNTER — Telehealth: Payer: Self-pay | Admitting: Oncology

## 2017-04-14 ENCOUNTER — Ambulatory Visit (HOSPITAL_BASED_OUTPATIENT_CLINIC_OR_DEPARTMENT_OTHER): Payer: Medicare Other

## 2017-04-14 ENCOUNTER — Other Ambulatory Visit (HOSPITAL_BASED_OUTPATIENT_CLINIC_OR_DEPARTMENT_OTHER): Payer: Medicare Other

## 2017-04-14 ENCOUNTER — Other Ambulatory Visit: Payer: Self-pay | Admitting: *Deleted

## 2017-04-14 ENCOUNTER — Ambulatory Visit (HOSPITAL_BASED_OUTPATIENT_CLINIC_OR_DEPARTMENT_OTHER): Payer: Medicare Other | Admitting: Oncology

## 2017-04-14 ENCOUNTER — Ambulatory Visit: Payer: Medicare Other

## 2017-04-14 VITALS — BP 151/90 | HR 83 | Temp 97.6°F | Resp 18 | Ht 64.0 in | Wt 168.7 lb

## 2017-04-14 DIAGNOSIS — C180 Malignant neoplasm of cecum: Secondary | ICD-10-CM

## 2017-04-14 DIAGNOSIS — C787 Secondary malignant neoplasm of liver and intrahepatic bile duct: Secondary | ICD-10-CM

## 2017-04-14 DIAGNOSIS — Z5111 Encounter for antineoplastic chemotherapy: Secondary | ICD-10-CM | POA: Diagnosis not present

## 2017-04-14 DIAGNOSIS — C189 Malignant neoplasm of colon, unspecified: Secondary | ICD-10-CM

## 2017-04-14 DIAGNOSIS — K769 Liver disease, unspecified: Secondary | ICD-10-CM

## 2017-04-14 DIAGNOSIS — Z5112 Encounter for antineoplastic immunotherapy: Secondary | ICD-10-CM

## 2017-04-14 DIAGNOSIS — C786 Secondary malignant neoplasm of retroperitoneum and peritoneum: Secondary | ICD-10-CM

## 2017-04-14 DIAGNOSIS — K6389 Other specified diseases of intestine: Secondary | ICD-10-CM

## 2017-04-14 LAB — CBC WITH DIFFERENTIAL/PLATELET
BASO%: 0.3 % (ref 0.0–2.0)
BASOS ABS: 0 10*3/uL (ref 0.0–0.1)
EOS ABS: 0.1 10*3/uL (ref 0.0–0.5)
EOS%: 2.1 % (ref 0.0–7.0)
HCT: 36.4 % (ref 34.8–46.6)
HGB: 12.3 g/dL (ref 11.6–15.9)
LYMPH%: 27.1 % (ref 14.0–49.7)
MCH: 32.9 pg (ref 25.1–34.0)
MCHC: 33.7 g/dL (ref 31.5–36.0)
MCV: 97.7 fL (ref 79.5–101.0)
MONO#: 0.5 10*3/uL (ref 0.1–0.9)
MONO%: 8.7 % (ref 0.0–14.0)
NEUT%: 61.8 % (ref 38.4–76.8)
NEUTROS ABS: 3.3 10*3/uL (ref 1.5–6.5)
Platelets: 202 10*3/uL (ref 145–400)
RBC: 3.73 10*6/uL (ref 3.70–5.45)
RDW: 17.4 % — ABNORMAL HIGH (ref 11.2–14.5)
WBC: 5.3 10*3/uL (ref 3.9–10.3)
lymph#: 1.4 10*3/uL (ref 0.9–3.3)

## 2017-04-14 LAB — COMPREHENSIVE METABOLIC PANEL
ALT: 11 U/L (ref 0–55)
AST: 16 U/L (ref 5–34)
Albumin: 3.4 g/dL — ABNORMAL LOW (ref 3.5–5.0)
Alkaline Phosphatase: 103 U/L (ref 40–150)
Anion Gap: 9 mEq/L (ref 3–11)
BUN: 22 mg/dL (ref 7.0–26.0)
CHLORIDE: 109 meq/L (ref 98–109)
CO2: 22 meq/L (ref 22–29)
Calcium: 9.2 mg/dL (ref 8.4–10.4)
Creatinine: 1 mg/dL (ref 0.6–1.1)
GLUCOSE: 112 mg/dL (ref 70–140)
POTASSIUM: 4 meq/L (ref 3.5–5.1)
SODIUM: 140 meq/L (ref 136–145)
Total Bilirubin: 0.52 mg/dL (ref 0.20–1.20)
Total Protein: 7.1 g/dL (ref 6.4–8.3)

## 2017-04-14 LAB — UA PROTEIN, DIPSTICK - CHCC: Protein, ur: NEGATIVE mg/dL

## 2017-04-14 LAB — CEA (IN HOUSE-CHCC): CEA (CHCC-IN HOUSE): 3.83 ng/mL (ref 0.00–5.00)

## 2017-04-14 MED ORDER — LORAZEPAM 1 MG PO TABS: 1.0000 mg | ORAL_TABLET | Freq: Three times a day (TID) | ORAL | 0 refills | Status: DC | PRN

## 2017-04-14 MED ORDER — PALONOSETRON HCL INJECTION 0.25 MG/5ML
INTRAVENOUS | Status: AC
  Filled 2017-04-14: qty 5

## 2017-04-14 MED ORDER — SODIUM CHLORIDE 0.9 % IV SOLN
5.0000 mg/kg | Freq: Once | INTRAVENOUS | Status: AC
  Administered 2017-04-14: 400 mg via INTRAVENOUS
  Filled 2017-04-14: qty 16

## 2017-04-14 MED ORDER — DEXAMETHASONE SODIUM PHOSPHATE 10 MG/ML IJ SOLN
10.0000 mg | Freq: Once | INTRAMUSCULAR | Status: AC
  Administered 2017-04-14: 10 mg via INTRAVENOUS

## 2017-04-14 MED ORDER — LORAZEPAM 1 MG PO TABS
ORAL_TABLET | ORAL | Status: AC
  Filled 2017-04-14: qty 1

## 2017-04-14 MED ORDER — LEUCOVORIN CALCIUM INJECTION 350 MG
400.0000 mg/m2 | Freq: Once | INTRAVENOUS | Status: AC
  Administered 2017-04-14: 752 mg via INTRAVENOUS
  Filled 2017-04-14: qty 37.6

## 2017-04-14 MED ORDER — PALONOSETRON HCL INJECTION 0.25 MG/5ML
0.2500 mg | Freq: Once | INTRAVENOUS | Status: AC
  Administered 2017-04-14: 0.25 mg via INTRAVENOUS

## 2017-04-14 MED ORDER — FLUOROURACIL CHEMO INJECTION 2.5 GM/50ML
400.0000 mg/m2 | Freq: Once | INTRAVENOUS | Status: AC
  Administered 2017-04-14: 750 mg via INTRAVENOUS
  Filled 2017-04-14: qty 15

## 2017-04-14 MED ORDER — DEXAMETHASONE SODIUM PHOSPHATE 10 MG/ML IJ SOLN
INTRAMUSCULAR | Status: AC
  Filled 2017-04-14: qty 1

## 2017-04-14 MED ORDER — LORAZEPAM 1 MG PO TABS
1.0000 mg | ORAL_TABLET | Freq: Once | ORAL | Status: AC
  Administered 2017-04-14: 1 mg via ORAL

## 2017-04-14 MED ORDER — SODIUM CHLORIDE 0.9 % IV SOLN
2400.0000 mg/m2 | INTRAVENOUS | Status: DC
  Administered 2017-04-14: 4500 mg via INTRAVENOUS
  Filled 2017-04-14: qty 90

## 2017-04-14 MED ORDER — SODIUM CHLORIDE 0.9 % IV SOLN
Freq: Once | INTRAVENOUS | Status: AC
  Administered 2017-04-14: 11:00:00 via INTRAVENOUS

## 2017-04-14 NOTE — Patient Instructions (Signed)
[place] Discharge Instructions for Patients Receiving Chemotherapy  Today you received the following chemotherapy agents Avastin, Leucovorin, Adrucil, 5FU.   To help prevent nausea and vomiting after your treatment, we encourage you to take your nausea medication as prescribed.   If you develop nausea and vomiting that is not controlled by your nausea medication, call the clinic.   BELOW ARE SYMPTOMS THAT SHOULD BE REPORTED IMMEDIATELY:  *FEVER GREATER THAN 100.5 F  *CHILLS WITH OR WITHOUT FEVER  NAUSEA AND VOMITING THAT IS NOT CONTROLLED WITH YOUR NAUSEA MEDICATION  *UNUSUAL SHORTNESS OF BREATH  *UNUSUAL BRUISING OR BLEEDING  TENDERNESS IN MOUTH AND THROAT WITH OR WITHOUT PRESENCE OF ULCERS  *URINARY PROBLEMS  *BOWEL PROBLEMS  UNUSUAL RASH Items with * indicate a potential emergency and should be followed up as soon as possible.  Feel free to call the clinic should you have any questions or concerns. The clinic phone number is [phone].  Please show the [place] at check-in to the Emergency Department and triage nurse.

## 2017-04-14 NOTE — Patient Instructions (Signed)
Implanted Port Home Guide An implanted port is a type of central line that is placed under the skin. Central lines are used to provide IV access when treatment or nutrition needs to be given through a person's veins. Implanted ports are used for long-term IV access. An implanted port may be placed because:  You need IV medicine that would be irritating to the small veins in your hands or arms.  You need long-term IV medicines, such as antibiotics.  You need IV nutrition for a long period.  You need frequent blood draws for lab tests.  You need dialysis.  Implanted ports are usually placed in the chest area, but they can also be placed in the upper arm, the abdomen, or the leg. An implanted port has two main parts:  Reservoir. The reservoir is round and will appear as a small, raised area under your skin. The reservoir is the part where a needle is inserted to give medicines or draw blood.  Catheter. The catheter is a thin, flexible tube that extends from the reservoir. The catheter is placed into a large vein. Medicine that is inserted into the reservoir goes into the catheter and then into the vein.  How will I care for my incision site? Do not get the incision site wet. Bathe or shower as directed by your health care provider. How is my port accessed? Special steps must be taken to access the port:  Before the port is accessed, a numbing cream can be placed on the skin. This helps numb the skin over the port site.  Your health care provider uses a sterile technique to access the port. ? Your health care provider must put on a mask and sterile gloves. ? The skin over your port is cleaned carefully with an antiseptic and allowed to dry. ? The port is gently pinched between sterile gloves, and a needle is inserted into the port.  Only "non-coring" port needles should be used to access the port. Once the port is accessed, a blood return should be checked. This helps ensure that the port  is in the vein and is not clogged.  If your port needs to remain accessed for a constant infusion, a clear (transparent) bandage will be placed over the needle site. The bandage and needle will need to be changed every week, or as directed by your health care provider.  Keep the bandage covering the needle clean and dry. Do not get it wet. Follow your health care provider's instructions on how to take a shower or bath while the port is accessed.  If your port does not need to stay accessed, no bandage is needed over the port.  What is flushing? Flushing helps keep the port from getting clogged. Follow your health care provider's instructions on how and when to flush the port. Ports are usually flushed with saline solution or a medicine called heparin. The need for flushing will depend on how the port is used.  If the port is used for intermittent medicines or blood draws, the port will need to be flushed: ? After medicines have been given. ? After blood has been drawn. ? As part of routine maintenance.  If a constant infusion is running, the port may not need to be flushed.  How long will my port stay implanted? The port can stay in for as long as your health care provider thinks it is needed. When it is time for the port to come out, surgery will be   done to remove it. The procedure is similar to the one performed when the port was put in. When should I seek immediate medical care? When you have an implanted port, you should seek immediate medical care if:  You notice a bad smell coming from the incision site.  You have swelling, redness, or drainage at the incision site.  You have more swelling or pain at the port site or the surrounding area.  You have a fever that is not controlled with medicine.  This information is not intended to replace advice given to you by your health care provider. Make sure you discuss any questions you have with your health care provider. Document  Released: 06/08/2005 Document Revised: 11/14/2015 Document Reviewed: 02/13/2013 Elsevier Interactive Patient Education  2017 [employer]  

## 2017-04-14 NOTE — Progress Notes (Signed)
Hematology and Oncology Follow Up Visit  Margaret Elliott [id number] 02/12/1951    04/14/17   Principle Diagnosis: 66-year-old woman diagnosed with colon cancer in June 2014. She had presented with an abdominal pain and found to have a cecal mass resulting in obstruction and appendicitis. She had Likely has stage IV disease with a liver mass.    Prior Therapy:  She is status post laparoscopic laparotomy, evacuation of a pelvic abscess and right hemicolectomy with ileocolonic anastomosis done on December 09, 2012. The pathology showed an invasive, well- differentiated colorectal adenocarcinoma. Tumor invades through the muscularis propria, with 2/18 lymph nodes involved, with the pathological staging T3 N1b disease. Her tumor was found to be microsatellite stable. KRAS wild type.   She is also status post Port-A-Cath insertion that was done on 01/05/2013.  FOLFOX chemotherapy started 01/18/2013. Avastin to be added with cycle 2 on 02/01/2013. She is S/P 12 cycles completed in 05/2013.   She is a status post microwave ablation of metastatic lesion in the posterior segment of the right lobe of the liver completed on 09/28/2014 and repeated on 11/30/2014.  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   Current therapy:  FOLFIRI and a Avastin salvage therapy started on 12/10/2015.  She has received 5-FU, leucovorin and a Avastin only starting with cycle 10.   Treatment was held after 11/18/2016 and resumed on 02/03/2017.   Interim History:  Margaret Elliott presents today for a followup visit. Since her last visit, she reports no changes in her health. She denied any recent complications related to chemotherapy. She denied any nausea, vomiting but did have mild diarrhea that is manageable.She denied early satiety or constipation. She denied any worsening peripheral neuropathy. She denied any hematochezia or melena. She continues to report  discoloration of her palms and dryness of her skin. She denied any excessive fatigue or tiredness. She denies any decline in her energy or performance status. Her quality of life is not changed.  She is not report any headaches, blurry vision, syncope or seizures. She does not report any fevers, chills or sweats. She does not report any chest pain, palpitation or orthopnea. She does not report any wheezing, hemoptysis or hematemesis. She does not report any frequency urgency or hesitancy. Does not report any lymphadenopathy or petechiae. Rest of her review of systems unremarkable.   Medications: I have reviewed the patient's current medications.  Current Outpatient Prescriptions  Medication Sig Dispense Refill  . acetaminophen (TYLENOL) 500 MG tablet Take 500 mg by mouth every 6 (six) hours as needed for pain.      . diphenhydrAMINE (BENADRYL) 25 mg capsule Take 25-50 mg by mouth daily as needed for itching. For allergic reaction      . lidocaine-prilocaine (EMLA) cream Apply 1 application topically as needed. Apply approx 1/2 tsp to skin over port, prior to chemotherapy treatments  1 kit  3  . Multiple Vitamin (MULTIVITAMIN) tablet Take 1 tablet by mouth daily.      . ondansetron (ZOFRAN) 8 MG tablet Take 1 tablet (8 mg total) by mouth every 8 (eight) hours as needed for nausea.  30 tablet  1   No current facility-administered medications for this visit.     Allergies: No Known Allergies  Past Medical History, Surgical history, Social history, and Family History were reviewed and updated.    Physical Exam:  Blood pressure (!) 151/90, pulse 83, temperature 97.6 F (36.4 C), temperature   source Oral, resp. rate 18, height 5' 4" (1.626 m), weight 168 lb 11.2 oz (76.5 kg), SpO2 100 %. ECOG: 0 General appearance: well-appearing woman without distress. Oropharynx: No oral thrush or lesions. Neck: no adenopathy  or masses. Lymph nodes: Cervical, supraclavicular, and axillary nodes  normal. Heart:regular rate and rhythm, S1, S2 normal, no murmur, rubs or gallops. Lung:chest clear, no wheezing, rales, normal symmetric air entry.  Chest wall examination: Revealed Port-A-Cath site without ecchymosis or drainage. Abdomen: soft, non-tender, without masses or nodularity noted. No rebound or guarding. Neurological: No new neurological deficits noted.  CBC    Component Value Date/Time   WBC 5.3 04/14/2017 0809   WBC 4.0 11/20/2015 0735   RBC 3.73 04/14/2017 0809   RBC 3.95 11/20/2015 0735   HGB 12.3 04/14/2017 0809   HCT 36.4 04/14/2017 0809   PLT 202 04/14/2017 0809   MCV 97.7 04/14/2017 0809   MCH 32.9 04/14/2017 0809   MCH 30.6 11/20/2015 0735   MCHC 33.7 04/14/2017 0809   MCHC 34.1 11/20/2015 0735   RDW 17.4 (H) 04/14/2017 0809   LYMPHSABS 1.4 04/14/2017 0809   MONOABS 0.5 04/14/2017 0809   EOSABS 0.1 04/14/2017 0809   BASOSABS 0.0 04/14/2017 0809        Impression and Plan:  66-year-old woman with the following issues:   1. Advanced Colon cancer. She presented with a large right cecal mass causing obstruction and abscess and appendicitis. After surgical resection she received 12 cycles of chemotherapy with PET/CT scan on 09/12/2013 showed no residual disease actually in the liver that is hypermetabolic.  She is status post liver directed therapy for metastatic disease and to the liver stream and was given in June 2016.  Her PET/CT scan on 11/08/2015 showed progressive omental and peritoneal metastasis. Biopsy proven to be adenocarcinoma of the colon that is KRAS wild-type but NRAS mutated.  She is S/P salvage FOLFIRI and a Avastin that is well-tolerated. CPT-11 was removed for better tolerance after cycle 10.  CT scan obtain on 06/16/2016 showed reasonable response to therapy with decrease in the size of her omental metastasis. She declined primary surgical therapy that included omentectomy.  She is currently receiving maintenance chemotherapy with  5-FU any Avastin.   CT scan on 12/15/2016 ontinues to show stable disease.   Risks and benefits of continuing this treatment was discussed with the patient today and she is agreeable to continue. We anticipate treatment break around December 2018 after a CT scan.   2. Intravenous access. Port-A-Cath remains in use without complications.  3. Neuropathy: unchanged at this time.  4. Antiemetics: Compazine is effective in managing her nausea.   5. Discoloration of her palms: Related to 5-FU without any evidence of erythema or induration. We have discussed strategies to improve this condition by applying moisturizing creams and maintaining adequate moisture in her skin.  6. Followup: Will be every 2 weeks for maintenance chemotherapy.    Firas Shadad MD 04/14/17   

## 2017-04-14 NOTE — Telephone Encounter (Signed)
Gave avs and calendar for November  °

## 2017-04-16 ENCOUNTER — Ambulatory Visit (HOSPITAL_BASED_OUTPATIENT_CLINIC_OR_DEPARTMENT_OTHER): Payer: Medicare Other

## 2017-04-16 VITALS — BP 118/79 | HR 75 | Temp 98.4°F | Resp 18

## 2017-04-16 DIAGNOSIS — C180 Malignant neoplasm of cecum: Secondary | ICD-10-CM | POA: Diagnosis not present

## 2017-04-16 DIAGNOSIS — Z95828 Presence of other vascular implants and grafts: Secondary | ICD-10-CM

## 2017-04-16 MED ORDER — SODIUM CHLORIDE 0.9 % IJ SOLN
10.0000 mL | INTRAMUSCULAR | Status: DC | PRN
  Administered 2017-04-16: 10 mL via INTRAVENOUS
  Filled 2017-04-16: qty 10

## 2017-04-16 MED ORDER — HEPARIN SOD (PORK) LOCK FLUSH 100 UNIT/ML IV SOLN
500.0000 [IU] | Freq: Once | INTRAVENOUS | Status: AC | PRN
  Administered 2017-04-16: 500 [IU] via INTRAVENOUS
  Filled 2017-04-16: qty 5

## 2017-04-16 NOTE — Patient Instructions (Signed)

## 2017-04-28 ENCOUNTER — Ambulatory Visit (HOSPITAL_BASED_OUTPATIENT_CLINIC_OR_DEPARTMENT_OTHER): Payer: Medicare Other

## 2017-04-28 ENCOUNTER — Other Ambulatory Visit (HOSPITAL_BASED_OUTPATIENT_CLINIC_OR_DEPARTMENT_OTHER): Payer: Medicare Other

## 2017-04-28 ENCOUNTER — Ambulatory Visit: Payer: Medicare Other

## 2017-04-28 ENCOUNTER — Ambulatory Visit (HOSPITAL_BASED_OUTPATIENT_CLINIC_OR_DEPARTMENT_OTHER): Payer: Medicare Other | Admitting: Oncology

## 2017-04-28 VITALS — BP 146/92 | HR 93 | Temp 98.3°F | Resp 18 | Ht 64.0 in | Wt 166.9 lb

## 2017-04-28 DIAGNOSIS — C189 Malignant neoplasm of colon, unspecified: Secondary | ICD-10-CM

## 2017-04-28 DIAGNOSIS — Z5112 Encounter for antineoplastic immunotherapy: Secondary | ICD-10-CM | POA: Diagnosis present

## 2017-04-28 DIAGNOSIS — K769 Liver disease, unspecified: Secondary | ICD-10-CM

## 2017-04-28 DIAGNOSIS — C180 Malignant neoplasm of cecum: Secondary | ICD-10-CM

## 2017-04-28 DIAGNOSIS — C786 Secondary malignant neoplasm of retroperitoneum and peritoneum: Secondary | ICD-10-CM

## 2017-04-28 DIAGNOSIS — C787 Secondary malignant neoplasm of liver and intrahepatic bile duct: Secondary | ICD-10-CM | POA: Diagnosis not present

## 2017-04-28 DIAGNOSIS — Z95828 Presence of other vascular implants and grafts: Secondary | ICD-10-CM

## 2017-04-28 DIAGNOSIS — K6389 Other specified diseases of intestine: Secondary | ICD-10-CM

## 2017-04-28 DIAGNOSIS — Z5111 Encounter for antineoplastic chemotherapy: Secondary | ICD-10-CM | POA: Diagnosis not present

## 2017-04-28 LAB — COMPREHENSIVE METABOLIC PANEL
ALT: 10 U/L (ref 0–55)
ANION GAP: 10 meq/L (ref 3–11)
AST: 15 U/L (ref 5–34)
Albumin: 3.4 g/dL — ABNORMAL LOW (ref 3.5–5.0)
Alkaline Phosphatase: 62 U/L (ref 40–150)
BILIRUBIN TOTAL: 0.99 mg/dL (ref 0.20–1.20)
BUN: 17.9 mg/dL (ref 7.0–26.0)
CALCIUM: 9.2 mg/dL (ref 8.4–10.4)
CHLORIDE: 108 meq/L (ref 98–109)
CO2: 22 meq/L (ref 22–29)
CREATININE: 1 mg/dL (ref 0.6–1.1)
Glucose: 116 mg/dl (ref 70–140)
Potassium: 3.9 mEq/L (ref 3.5–5.1)
Sodium: 140 mEq/L (ref 136–145)
Total Protein: 7.2 g/dL (ref 6.4–8.3)

## 2017-04-28 LAB — CBC WITH DIFFERENTIAL/PLATELET
BASO%: 0.4 % (ref 0.0–2.0)
BASOS ABS: 0 10*3/uL (ref 0.0–0.1)
EOS ABS: 0.1 10*3/uL (ref 0.0–0.5)
EOS%: 2.1 % (ref 0.0–7.0)
HEMATOCRIT: 37.1 % (ref 34.8–46.6)
HEMOGLOBIN: 12.3 g/dL (ref 11.6–15.9)
LYMPH#: 1.2 10*3/uL (ref 0.9–3.3)
LYMPH%: 27.3 % (ref 14.0–49.7)
MCH: 32.5 pg (ref 25.1–34.0)
MCHC: 33.2 g/dL (ref 31.5–36.0)
MCV: 98 fL (ref 79.5–101.0)
MONO#: 0.4 10*3/uL (ref 0.1–0.9)
MONO%: 10.3 % (ref 0.0–14.0)
NEUT%: 59.9 % (ref 38.4–76.8)
NEUTROS ABS: 2.5 10*3/uL (ref 1.5–6.5)
Platelets: 198 10*3/uL (ref 145–400)
RBC: 3.78 10*6/uL (ref 3.70–5.45)
RDW: 18.5 % — AB (ref 11.2–14.5)
WBC: 4.2 10*3/uL (ref 3.9–10.3)

## 2017-04-28 LAB — CEA (IN HOUSE-CHCC): CEA (CHCC-IN HOUSE): 3.66 ng/mL (ref 0.00–5.00)

## 2017-04-28 MED ORDER — SODIUM CHLORIDE 0.9% FLUSH
10.0000 mL | INTRAVENOUS | Status: DC | PRN
  Filled 2017-04-28: qty 10

## 2017-04-28 MED ORDER — DEXAMETHASONE SODIUM PHOSPHATE 10 MG/ML IJ SOLN
10.0000 mg | Freq: Once | INTRAMUSCULAR | Status: AC
  Administered 2017-04-28: 10 mg via INTRAVENOUS

## 2017-04-28 MED ORDER — SODIUM CHLORIDE 0.9 % IV SOLN
5.0000 mg/kg | Freq: Once | INTRAVENOUS | Status: AC
  Administered 2017-04-28: 400 mg via INTRAVENOUS
  Filled 2017-04-28: qty 16

## 2017-04-28 MED ORDER — DEXAMETHASONE SODIUM PHOSPHATE 10 MG/ML IJ SOLN
INTRAMUSCULAR | Status: AC
  Filled 2017-04-28: qty 1

## 2017-04-28 MED ORDER — DEXTROSE 5 % IV SOLN
400.0000 mg/m2 | Freq: Once | INTRAVENOUS | Status: AC
  Administered 2017-04-28: 752 mg via INTRAVENOUS
  Filled 2017-04-28: qty 37.6

## 2017-04-28 MED ORDER — PALONOSETRON HCL INJECTION 0.25 MG/5ML
INTRAVENOUS | Status: AC
  Filled 2017-04-28: qty 5

## 2017-04-28 MED ORDER — SODIUM CHLORIDE 0.9 % IV SOLN
Freq: Once | INTRAVENOUS | Status: AC
  Administered 2017-04-28: 09:00:00 via INTRAVENOUS

## 2017-04-28 MED ORDER — SODIUM CHLORIDE 0.9 % IV SOLN
2400.0000 mg/m2 | INTRAVENOUS | Status: DC
  Administered 2017-04-28: 4500 mg via INTRAVENOUS
  Filled 2017-04-28: qty 90

## 2017-04-28 MED ORDER — FLUOROURACIL CHEMO INJECTION 2.5 GM/50ML
400.0000 mg/m2 | Freq: Once | INTRAVENOUS | Status: AC
  Administered 2017-04-28: 750 mg via INTRAVENOUS
  Filled 2017-04-28: qty 15

## 2017-04-28 MED ORDER — PALONOSETRON HCL INJECTION 0.25 MG/5ML
0.2500 mg | Freq: Once | INTRAVENOUS | Status: AC
  Administered 2017-04-28: 0.25 mg via INTRAVENOUS

## 2017-04-28 MED ORDER — LORAZEPAM 1 MG PO TABS
1.0000 mg | ORAL_TABLET | Freq: Once | ORAL | Status: AC
Start: 2017-04-28 — End: 2017-04-28
  Administered 2017-04-28: 1 mg via ORAL

## 2017-04-28 MED ORDER — SODIUM CHLORIDE 0.9 % IJ SOLN
10.0000 mL | INTRAMUSCULAR | Status: DC | PRN
  Administered 2017-04-28: 10 mL via INTRAVENOUS
  Filled 2017-04-28: qty 10

## 2017-04-28 MED ORDER — LORAZEPAM 1 MG PO TABS
ORAL_TABLET | ORAL | Status: AC
  Filled 2017-04-28: qty 1

## 2017-04-28 NOTE — Progress Notes (Signed)
Hematology and Oncology Follow Up Visit  Margaret Elliott [id number] 05/18/1951    04/28/17   Principle Diagnosis: 66 year old woman diagnosed with colon cancer in June 2014. She had presented with an abdominal pain and found to have a cecal mass resulting in obstruction and appendicitis. She had Likely has stage IV disease with a liver mass.    Prior Therapy:  She is status post laparoscopic laparotomy, evacuation of a pelvic abscess and right hemicolectomy with ileocolonic anastomosis done on December 09, 2012. The pathology showed an invasive, well- differentiated colorectal adenocarcinoma. Tumor invades through the muscularis propria, with 2/18 lymph nodes involved, with the pathological staging T3 N1b disease. Her tumor was found to be microsatellite stable. KRAS wild type.   She is also status post Port-A-Cath insertion that was done on 01/05/2013.  FOLFOX chemotherapy started 01/18/2013. Avastin to be added with cycle 2 on 02/01/2013. She is S/P 12 cycles completed in 05/2013.   She is a status post microwave ablation of metastatic lesion in the posterior segment of the right lobe of the liver completed on 09/28/2014 and repeated on 11/30/2014.  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   Current therapy:  FOLFIRI and a Avastin salvage therapy started on 12/10/2015.   She has received 5-FU, leucovorin and a Avastin only starting with cycle 10.    Treatment was held after 11/18/2016 and resumed on 02/03/2017.  She continues to receive that every 2 weeks for maintenance purposes.   Interim History:  Margaret Elliott presents today for a followup visit. Since her last visit, she continues to be fair overall.  She denied any recent complications related to chemotherapy. She denied any nausea, vomiting.She denied early satiety or constipation. She denied any worsening peripheral neuropathy. She denied any hematochezia or  melena.   She continues to enjoy a reasonable quality of life although she does report some fatigue associated with chemotherapy especially few days after the conclusion.  She still able to drive and attends activities of daily living.  She is not report any headaches, blurry vision, syncope or seizures. She does not report any fevers, chills or sweats. She does not report any chest pain, palpitation or orthopnea. She does not report any wheezing, hemoptysis or hematemesis. She does not report any frequency urgency or hesitancy. Does not report any lymphadenopathy or petechiae. Rest of her review of systems unremarkable.   Medications: I have reviewed the patient's current medications.  Current Outpatient Prescriptions  Medication Sig Dispense Refill  . acetaminophen (TYLENOL) 500 MG tablet Take 500 mg by mouth every 6 (six) hours as needed for pain.      . diphenhydrAMINE (BENADRYL) 25 mg capsule Take 25-50 mg by mouth daily as needed for itching. For allergic reaction      . lidocaine-prilocaine (EMLA) cream Apply 1 application topically as needed. Apply approx 1/2 tsp to skin over port, prior to chemotherapy treatments  1 kit  3  . Multiple Vitamin (MULTIVITAMIN) tablet Take 1 tablet by mouth daily.      . ondansetron (ZOFRAN) 8 MG tablet Take 1 tablet (8 mg total) by mouth every 8 (eight) hours as needed for nausea.  30 tablet  1   No current facility-administered medications for this visit.     Allergies: No Known Allergies  Past Medical History, Surgical history, Social history, and Family History were reviewed and updated.    Physical Exam:  Blood pressure (!) 146/92, pulse  93, temperature 98.3 F (36.8 C), temperature source Oral, resp. rate 18, height 5' 4" (1.626 m), weight 166 lb 14.4 oz (75.7 kg), SpO2 100 %. ECOG: 0 General appearance: Alert, awake woman without distress. Oropharynx: No oral ulcers or lesions. Neck: no adenopathy  or masses. Lymph nodes: Cervical,  supraclavicular, and axillary nodes normal. Heart:regular rate and rhythm, S1, S2 normal, no murmur, rubs or gallops. Lung:chest clear, no wheezing, rales, normal symmetric air entry.  Chest wall examination: Revealed Port-A-Cath site without ecchymosis or drainage. Abdomen: soft, non-tender, without masses or nodularity noted. No shifting dullness or ascites. Neurological: No new neurological deficits noted.  CBC    Component Value Date/Time   WBC 4.2 04/28/2017 0830   WBC 4.0 11/20/2015 0735   RBC 3.78 04/28/2017 0830   RBC 3.95 11/20/2015 0735   HGB 12.3 04/28/2017 0830   HCT 37.1 04/28/2017 0830   PLT 198 04/28/2017 0830   MCV 98.0 04/28/2017 0830   MCH 32.5 04/28/2017 0830   MCH 30.6 11/20/2015 0735   MCHC 33.2 04/28/2017 0830   MCHC 34.1 11/20/2015 0735   RDW 18.5 (H) 04/28/2017 0830   LYMPHSABS 1.2 04/28/2017 0830   MONOABS 0.4 04/28/2017 0830   EOSABS 0.1 04/28/2017 0830   BASOSABS 0.0 04/28/2017 0830        Impression and Plan:  66 year old woman with the following issues:   1. Advanced Colon cancer. She presented with a large right cecal mass causing obstruction and abscess and appendicitis. After surgical resection she received 12 cycles of chemotherapy with PET/CT scan on 09/12/2013 showed no residual disease actually in the liver that is hypermetabolic.  She is status post liver directed therapy for metastatic disease and to the liver stream and was given in June 2016.  Her PET/CT scan on 11/08/2015 showed progressive omental and peritoneal metastasis. Biopsy proven to be adenocarcinoma of the colon that is KRAS wild-type but NRAS mutated.  She is S/P salvage FOLFIRI and a Avastin that is well-tolerated. CPT-11 was removed for better tolerance after cycle 10.  CT scan obtain on 06/16/2016 showed reasonable response to therapy with decrease in the size of her omental metastasis. She declined primary surgical therapy that included omentectomy.  She is  currently receiving maintenance chemotherapy with 5-FU any Avastin.   CT scan on 12/15/2016 ontinues to show stable disease.   The plan is to continue with maintenance chemotherapy for the time being and repeat imaging studies in December 2018.  She is planning a treatment break around December for a vacation in [place].   2. Intravenous access. Port-A-Cath remains in use without complications.  3. Neuropathy: Continues to be manageable and functions reasonably well at this time.  4. Antiemetics: Compazine is effective in managing her nausea.   5. Discoloration of her palms: Related to 5-FU without any evidence of erythema or induration.  No changes noted at this time.  6. Followup: Will be every 2 weeks for maintenance chemotherapy.    Zola Button MD 04/28/17

## 2017-04-28 NOTE — Patient Instructions (Signed)
[facility] Discharge Instructions for Patients Receiving Chemotherapy  Today you received the following chemotherapy agents Leucovorin, 5FU, and Avastin  To help prevent nausea and vomiting after your treatment, we encourage you to take your nausea medication as directed   If you develop nausea and vomiting that is not controlled by your nausea medication, call the clinic.   BELOW ARE SYMPTOMS THAT SHOULD BE REPORTED IMMEDIATELY:  *FEVER GREATER THAN 100.5 F  *CHILLS WITH OR WITHOUT FEVER  NAUSEA AND VOMITING THAT IS NOT CONTROLLED WITH YOUR NAUSEA MEDICATION  *UNUSUAL SHORTNESS OF BREATH  *UNUSUAL BRUISING OR BLEEDING  TENDERNESS IN MOUTH AND THROAT WITH OR WITHOUT PRESENCE OF ULCERS  *URINARY PROBLEMS  *BOWEL PROBLEMS  UNUSUAL RASH Items with * indicate a potential emergency and should be followed up as soon as possible.  Feel free to call the clinic should you have any questions or concerns. The clinic phone number is [phone].  Please show the CHEMO ALERT CARD at check-in to the Emergency Department and triage nurse.   

## 2017-04-30 ENCOUNTER — Ambulatory Visit (HOSPITAL_BASED_OUTPATIENT_CLINIC_OR_DEPARTMENT_OTHER): Payer: Medicare Other

## 2017-04-30 VITALS — BP 155/87 | HR 77 | Temp 97.6°F | Resp 18

## 2017-04-30 DIAGNOSIS — K769 Liver disease, unspecified: Secondary | ICD-10-CM

## 2017-04-30 DIAGNOSIS — C180 Malignant neoplasm of cecum: Secondary | ICD-10-CM | POA: Diagnosis not present

## 2017-04-30 DIAGNOSIS — C189 Malignant neoplasm of colon, unspecified: Secondary | ICD-10-CM

## 2017-04-30 DIAGNOSIS — K6389 Other specified diseases of intestine: Secondary | ICD-10-CM

## 2017-04-30 MED ORDER — SODIUM CHLORIDE 0.9% FLUSH
10.0000 mL | INTRAVENOUS | Status: DC | PRN
  Administered 2017-04-30: 10 mL
  Filled 2017-04-30: qty 10

## 2017-04-30 MED ORDER — HEPARIN SOD (PORK) LOCK FLUSH 100 UNIT/ML IV SOLN
500.0000 [IU] | Freq: Once | INTRAVENOUS | Status: AC | PRN
  Administered 2017-04-30: 500 [IU]
  Filled 2017-04-30: qty 5

## 2017-05-12 ENCOUNTER — Ambulatory Visit (HOSPITAL_BASED_OUTPATIENT_CLINIC_OR_DEPARTMENT_OTHER): Payer: Medicare Other | Admitting: Oncology

## 2017-05-12 ENCOUNTER — Ambulatory Visit (HOSPITAL_BASED_OUTPATIENT_CLINIC_OR_DEPARTMENT_OTHER): Payer: Medicare Other

## 2017-05-12 ENCOUNTER — Other Ambulatory Visit (HOSPITAL_BASED_OUTPATIENT_CLINIC_OR_DEPARTMENT_OTHER): Payer: Medicare Other

## 2017-05-12 ENCOUNTER — Ambulatory Visit: Payer: Medicare Other

## 2017-05-12 ENCOUNTER — Telehealth: Payer: Self-pay | Admitting: Oncology

## 2017-05-12 VITALS — BP 149/89 | HR 88 | Temp 98.2°F | Resp 18 | Ht 64.0 in | Wt 168.1 lb

## 2017-05-12 VITALS — BP 145/72 | HR 78

## 2017-05-12 DIAGNOSIS — C180 Malignant neoplasm of cecum: Secondary | ICD-10-CM

## 2017-05-12 DIAGNOSIS — Z95828 Presence of other vascular implants and grafts: Secondary | ICD-10-CM

## 2017-05-12 DIAGNOSIS — C189 Malignant neoplasm of colon, unspecified: Secondary | ICD-10-CM

## 2017-05-12 DIAGNOSIS — C786 Secondary malignant neoplasm of retroperitoneum and peritoneum: Secondary | ICD-10-CM | POA: Diagnosis not present

## 2017-05-12 DIAGNOSIS — K769 Liver disease, unspecified: Secondary | ICD-10-CM

## 2017-05-12 DIAGNOSIS — Z5111 Encounter for antineoplastic chemotherapy: Secondary | ICD-10-CM

## 2017-05-12 DIAGNOSIS — K6389 Other specified diseases of intestine: Secondary | ICD-10-CM

## 2017-05-12 DIAGNOSIS — C787 Secondary malignant neoplasm of liver and intrahepatic bile duct: Secondary | ICD-10-CM

## 2017-05-12 DIAGNOSIS — Z5112 Encounter for antineoplastic immunotherapy: Secondary | ICD-10-CM

## 2017-05-12 LAB — COMPREHENSIVE METABOLIC PANEL
ALBUMIN: 3.3 g/dL — AB (ref 3.5–5.0)
ALK PHOS: 80 U/L (ref 40–150)
ALT: 11 U/L (ref 0–55)
ANION GAP: 8 meq/L (ref 3–11)
AST: 17 U/L (ref 5–34)
BUN: 14.9 mg/dL (ref 7.0–26.0)
CO2: 24 mEq/L (ref 22–29)
Calcium: 9.3 mg/dL (ref 8.4–10.4)
Chloride: 111 mEq/L — ABNORMAL HIGH (ref 98–109)
Creatinine: 1.1 mg/dL (ref 0.6–1.1)
GLUCOSE: 121 mg/dL (ref 70–140)
POTASSIUM: 3.6 meq/L (ref 3.5–5.1)
SODIUM: 142 meq/L (ref 136–145)
Total Bilirubin: 0.7 mg/dL (ref 0.20–1.20)
Total Protein: 7 g/dL (ref 6.4–8.3)

## 2017-05-12 LAB — CBC WITH DIFFERENTIAL/PLATELET
BASO%: 0.5 % (ref 0.0–2.0)
BASOS ABS: 0 10*3/uL (ref 0.0–0.1)
EOS%: 2 % (ref 0.0–7.0)
Eosinophils Absolute: 0.1 10*3/uL (ref 0.0–0.5)
HEMATOCRIT: 36.2 % (ref 34.8–46.6)
HEMOGLOBIN: 12 g/dL (ref 11.6–15.9)
LYMPH#: 1.3 10*3/uL (ref 0.9–3.3)
LYMPH%: 32.1 % (ref 14.0–49.7)
MCH: 33.1 pg (ref 25.1–34.0)
MCHC: 33.1 g/dL (ref 31.5–36.0)
MCV: 99.7 fL (ref 79.5–101.0)
MONO#: 0.3 10*3/uL (ref 0.1–0.9)
MONO%: 7.2 % (ref 0.0–14.0)
NEUT%: 58.2 % (ref 38.4–76.8)
NEUTROS ABS: 2.3 10*3/uL (ref 1.5–6.5)
PLATELETS: 180 10*3/uL (ref 145–400)
RBC: 3.63 10*6/uL — AB (ref 3.70–5.45)
RDW: 17.5 % — AB (ref 11.2–14.5)
WBC: 4 10*3/uL (ref 3.9–10.3)

## 2017-05-12 LAB — CEA (IN HOUSE-CHCC): CEA (CHCC-IN HOUSE): 4.03 ng/mL (ref 0.00–5.00)

## 2017-05-12 MED ORDER — SODIUM CHLORIDE 0.9 % IV SOLN
5.0000 mg/kg | Freq: Once | INTRAVENOUS | Status: AC
  Administered 2017-05-12: 400 mg via INTRAVENOUS
  Filled 2017-05-12: qty 16

## 2017-05-12 MED ORDER — DEXAMETHASONE SODIUM PHOSPHATE 10 MG/ML IJ SOLN
INTRAMUSCULAR | Status: AC
  Filled 2017-05-12: qty 1

## 2017-05-12 MED ORDER — SODIUM CHLORIDE 0.9 % IV SOLN
Freq: Once | INTRAVENOUS | Status: AC
  Administered 2017-05-12: 09:00:00 via INTRAVENOUS

## 2017-05-12 MED ORDER — FLUOROURACIL CHEMO INJECTION 2.5 GM/50ML
400.0000 mg/m2 | Freq: Once | INTRAVENOUS | Status: AC
  Administered 2017-05-12: 750 mg via INTRAVENOUS
  Filled 2017-05-12: qty 15

## 2017-05-12 MED ORDER — LEUCOVORIN CALCIUM INJECTION 350 MG
400.0000 mg/m2 | Freq: Once | INTRAVENOUS | Status: AC
  Administered 2017-05-12: 752 mg via INTRAVENOUS
  Filled 2017-05-12: qty 37.6

## 2017-05-12 MED ORDER — SODIUM CHLORIDE 0.9 % IV SOLN
2400.0000 mg/m2 | INTRAVENOUS | Status: DC
  Administered 2017-05-12: 4500 mg via INTRAVENOUS
  Filled 2017-05-12: qty 90

## 2017-05-12 MED ORDER — DEXAMETHASONE SODIUM PHOSPHATE 10 MG/ML IJ SOLN
10.0000 mg | Freq: Once | INTRAMUSCULAR | Status: AC
  Administered 2017-05-12: 10 mg via INTRAVENOUS

## 2017-05-12 MED ORDER — PALONOSETRON HCL INJECTION 0.25 MG/5ML
0.2500 mg | Freq: Once | INTRAVENOUS | Status: AC
  Administered 2017-05-12: 0.25 mg via INTRAVENOUS

## 2017-05-12 MED ORDER — PALONOSETRON HCL INJECTION 0.25 MG/5ML
INTRAVENOUS | Status: AC
  Filled 2017-05-12: qty 5

## 2017-05-12 MED ORDER — SODIUM CHLORIDE 0.9 % IJ SOLN
10.0000 mL | INTRAMUSCULAR | Status: DC | PRN
  Administered 2017-05-12: 10 mL via INTRAVENOUS
  Filled 2017-05-12: qty 10

## 2017-05-12 MED ORDER — LORAZEPAM 1 MG PO TABS
1.0000 mg | ORAL_TABLET | Freq: Once | ORAL | Status: AC
  Administered 2017-05-12: 1 mg via ORAL

## 2017-05-12 MED ORDER — LORAZEPAM 1 MG PO TABS
ORAL_TABLET | ORAL | Status: AC
  Filled 2017-05-12: qty 1

## 2017-05-12 NOTE — Telephone Encounter (Signed)
Scheduled appt per 11/21 los - Gave patient AVS and calender per los. Central radiology to contact patient with ct scan

## 2017-05-12 NOTE — Patient Instructions (Signed)
[facility]cer Center Discharge Instructions for Patients Receiving Chemotherapy  Today you received the following chemotherapy agents Avastin,Leucovorin and Adrucil  To help prevent nausea and vomiting after your treatment, we encourage you to take your nausea medication as directed  If you develop nausea and vomiting that is not controlled by your nausea medication, call the clinic.   BELOW ARE SYMPTOMS THAT SHOULD BE REPORTED IMMEDIATELY:  *FEVER GREATER THAN 100.5 F  *CHILLS WITH OR WITHOUT FEVER  NAUSEA AND VOMITING THAT IS NOT CONTROLLED WITH YOUR NAUSEA MEDICATION  *UNUSUAL SHORTNESS OF BREATH  *UNUSUAL BRUISING OR BLEEDING  TENDERNESS IN MOUTH AND THROAT WITH OR WITHOUT PRESENCE OF ULCERS  *URINARY PROBLEMS  *BOWEL PROBLEMS  UNUSUAL RASH Items with * indicate a potential emergency and should be followed up as soon as possible.  Feel free to call the clinic should you have any questions or concerns. The clinic phone number is [phone].  Please show the CHEMO ALERT CARD at check-in to the Emergency Department and triage nurse.   

## 2017-05-12 NOTE — Progress Notes (Signed)
Blood return noted before, during and after Adrucil push.  

## 2017-05-12 NOTE — Progress Notes (Signed)
Hematology and Oncology Follow Up Visit  Margaret Elliott [id number] 1951/05/10    05/12/17   Principle Diagnosis: 66 year old woman diagnosed with colon cancer in June 2014. She had presented with an abdominal pain and found to have a cecal mass resulting in obstruction and appendicitis. She had Likely has stage IV disease with a liver mass.    Prior Therapy:  She is status post laparoscopic laparotomy, evacuation of a pelvic abscess and right hemicolectomy with ileocolonic anastomosis done on December 09, 2012. The pathology showed an invasive, well- differentiated colorectal adenocarcinoma. Tumor invades through the muscularis propria, with 2/18 lymph nodes involved, with the pathological staging T3 N1b disease. Her tumor was found to be microsatellite stable. KRAS wild type.   She is also status post Port-A-Cath insertion that was done on 01/05/2013.  FOLFOX chemotherapy started 01/18/2013. Avastin to be added with cycle 2 on 02/01/2013. She is S/P 12 cycles completed in 05/2013.   She is a status post microwave ablation of metastatic lesion in the posterior segment of the right lobe of the liver completed on 09/28/2014 and repeated on 11/30/2014.  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   Current therapy:  FOLFIRI and a Avastin salvage therapy started on 12/10/2015.   She has received 5-FU, leucovorin and a Avastin only starting with cycle 10.    Treatment was held after 11/18/2016 and resumed on 02/03/2017.  She continues to receive that every 2 weeks for maintenance purposes.   Interim History:  Margaret Elliott presents today for a followup visit. Since her last visit, she reports no changes in her health.  She received the last cycle of chemotherapy without complications. She denied any nausea, vomiting.She denied early satiety or constipation. She denied any worsening peripheral neuropathy. She denied any other GI  symptoms.  She does report irritation in hands and feet which is unchanged. She still able to drive and attends activities of daily living.  She is not report any headaches, blurry vision, syncope or seizures. She does not report any fevers, chills or sweats. She does not report any chest pain, palpitation or orthopnea. She does not report any wheezing, hemoptysis or hematemesis. She does not report any frequency urgency or hesitancy. Does not report any lymphadenopathy or petechiae. Rest of her review of systems unremarkable.   Medications: I have reviewed the patient's current medications.  Current Outpatient Prescriptions  Medication Sig Dispense Refill  . acetaminophen (TYLENOL) 500 MG tablet Take 500 mg by mouth every 6 (six) hours as needed for pain.      . diphenhydrAMINE (BENADRYL) 25 mg capsule Take 25-50 mg by mouth daily as needed for itching. For allergic reaction      . lidocaine-prilocaine (EMLA) cream Apply 1 application topically as needed. Apply approx 1/2 tsp to skin over port, prior to chemotherapy treatments  1 kit  3  . Multiple Vitamin (MULTIVITAMIN) tablet Take 1 tablet by mouth daily.      . ondansetron (ZOFRAN) 8 MG tablet Take 1 tablet (8 mg total) by mouth every 8 (eight) hours as needed for nausea.  30 tablet  1   No current facility-administered medications for this visit.     Allergies: No Known Allergies  Past Medical History, Surgical history, Social history, and Family History were reviewed and updated.    Physical Exam:  Blood pressure (!) 149/89, pulse 88, temperature 98.2 F (36.8 C), temperature source Oral, resp. rate 18, height  $'5\' 4"'W$  (1.626 m), weight 168 lb 1.6 oz (76.2 kg), SpO2 100 %. ECOG: 0 General appearance: Well-appearing woman without distress. Oropharynx: No oral thrush noted. Neck: no adenopathy  Lymph nodes: Cervical, supraclavicular, and axillary nodes normal. Heart:regular rate and rhythm, S1, S2 normal, no murmur, rubs or  gallops. Lung:chest clear, no wheezing, rales, normal symmetric air entry.  Chest wall examination: Revealed Port-A-Cath without any erythema or drainage. Abdomen: soft, non-tender, without masses or nodularity noted. No  Neurological: No new neurological deficits noted.  CBC    Component Value Date/Time   WBC 4.0 05/12/2017 0823   WBC 4.0 11/20/2015 0735   RBC 3.63 (L) 05/12/2017 0823   RBC 3.95 11/20/2015 0735   HGB 12.0 05/12/2017 0823   HCT 36.2 05/12/2017 0823   PLT 180 05/12/2017 0823   MCV 99.7 05/12/2017 0823   MCH 33.1 05/12/2017 0823   MCH 30.6 11/20/2015 0735   MCHC 33.1 05/12/2017 0823   MCHC 34.1 11/20/2015 0735   RDW 17.5 (H) 05/12/2017 0823   LYMPHSABS 1.3 05/12/2017 0823   MONOABS 0.3 05/12/2017 0823   EOSABS 0.1 05/12/2017 0823   BASOSABS 0.0 05/12/2017 0823        Impression and Plan:  66 year old woman with the following issues:   1. Advanced Colon cancer. She presented with a large right cecal mass causing obstruction and abscess and appendicitis. After surgical resection she received 12 cycles of chemotherapy with PET/CT scan on 09/12/2013 showed no residual disease actually in the liver that is hypermetabolic.  She is status post liver directed therapy for metastatic disease and to the liver stream and was given in June 2016.   PET/CT scan on 11/08/2015 showed progressive omental and peritoneal metastasis. Biopsy proven to be adenocarcinoma of the colon that is KRAS wild-type but NRAS mutated.  She is S/P salvage FOLFIRI and a Avastin that is well-tolerated. CPT-11 was removed for better tolerance after cycle 10.  CT scan obtain on 06/16/2016 showed reasonable response to therapy with decrease in the size of her omental metastasis. She declined primary surgical therapy that included omentectomy.  She is currently receiving maintenance chemotherapy with 5-FU any Avastin.   CT scan on 12/15/2016 ontinues to show stable disease.   Risks and  benefits of continuing maintenance therapy was discussed today and she is agreeable to continue.  She will receive her treatment today and have staging CT scan done next week.  She will receive December treatment and then have a treatment break until January 2019.  Different salvage therapy will be used if needed to she has symptomatic progression.  2. Intravenous access. Port-A-Cath in place without complications.  No issues with usage.  3. Neuropathy: Unchanged at this time.  Not interfering with her functionality.  4. Antiemetics: Compazine is effective in managing her nausea.   5. Discoloration of her palms: Related to 5-FU without any evidence of erythema or induration.  No issues reported at this time.  6. Followup: Will be every 2 weeks for maintenance chemotherapy and discussed the results of her CT scan.    Zola Button MD 05/12/17

## 2017-05-14 ENCOUNTER — Ambulatory Visit (HOSPITAL_BASED_OUTPATIENT_CLINIC_OR_DEPARTMENT_OTHER): Payer: Medicare Other

## 2017-05-14 VITALS — BP 155/85 | HR 78 | Temp 98.2°F | Resp 18

## 2017-05-14 DIAGNOSIS — C180 Malignant neoplasm of cecum: Secondary | ICD-10-CM | POA: Diagnosis not present

## 2017-05-14 DIAGNOSIS — K6389 Other specified diseases of intestine: Secondary | ICD-10-CM

## 2017-05-14 DIAGNOSIS — C189 Malignant neoplasm of colon, unspecified: Secondary | ICD-10-CM

## 2017-05-14 DIAGNOSIS — K769 Liver disease, unspecified: Secondary | ICD-10-CM

## 2017-05-14 MED ORDER — HEPARIN SOD (PORK) LOCK FLUSH 100 UNIT/ML IV SOLN
500.0000 [IU] | Freq: Once | INTRAVENOUS | Status: AC | PRN
Start: 2017-05-14 — End: 2017-05-14
  Administered 2017-05-14: 500 [IU]
  Filled 2017-05-14: qty 5

## 2017-05-14 MED ORDER — SODIUM CHLORIDE 0.9% FLUSH
10.0000 mL | INTRAVENOUS | Status: DC | PRN
  Administered 2017-05-14: 10 mL
  Filled 2017-05-14: qty 10

## 2017-05-24 ENCOUNTER — Encounter (HOSPITAL_COMMUNITY): Payer: Self-pay

## 2017-05-24 ENCOUNTER — Ambulatory Visit (HOSPITAL_COMMUNITY)
Admission: RE | Admit: 2017-05-24 | Discharge: 2017-05-24 | Disposition: A | Discharge status: Home / Self Care | Payer: Medicare Other | Source: Ambulatory Visit | Attending: Oncology | Admitting: Oncology

## 2017-05-24 DIAGNOSIS — K439 Ventral hernia without obstruction or gangrene: Secondary | ICD-10-CM | POA: Diagnosis not present

## 2017-05-24 DIAGNOSIS — R918 Other nonspecific abnormal finding of lung field: Secondary | ICD-10-CM | POA: Insufficient documentation

## 2017-05-24 DIAGNOSIS — Z9889 Other specified postprocedural states: Secondary | ICD-10-CM | POA: Insufficient documentation

## 2017-05-24 DIAGNOSIS — M47896 Other spondylosis, lumbar region: Secondary | ICD-10-CM | POA: Diagnosis not present

## 2017-05-24 DIAGNOSIS — R932 Abnormal findings on diagnostic imaging of liver and biliary tract: Secondary | ICD-10-CM | POA: Diagnosis not present

## 2017-05-24 DIAGNOSIS — C189 Malignant neoplasm of colon, unspecified: Secondary | ICD-10-CM | POA: Diagnosis not present

## 2017-05-24 DIAGNOSIS — N83202 Unspecified ovarian cyst, left side: Secondary | ICD-10-CM | POA: Diagnosis not present

## 2017-05-24 DIAGNOSIS — I2510 Atherosclerotic heart disease of native coronary artery without angina pectoris: Secondary | ICD-10-CM | POA: Diagnosis not present

## 2017-05-24 DIAGNOSIS — I700 Atherosclerosis of aorta: Secondary | ICD-10-CM | POA: Insufficient documentation

## 2017-05-24 DIAGNOSIS — M5136 Other intervertebral disc degeneration, lumbar region: Secondary | ICD-10-CM | POA: Diagnosis not present

## 2017-05-24 MED ORDER — IOPAMIDOL (ISOVUE-300) INJECTION 61%
100.0000 mL | Freq: Once | INTRAVENOUS | Status: AC | PRN
  Administered 2017-05-24: 100 mL via INTRAVENOUS

## 2017-05-24 MED ORDER — HEPARIN SOD (PORK) LOCK FLUSH 100 UNIT/ML IV SOLN
INTRAVENOUS | Status: AC
  Administered 2017-05-24: 500 [IU] via INTRAVENOUS
  Filled 2017-05-24: qty 5

## 2017-05-24 MED ORDER — HEPARIN SOD (PORK) LOCK FLUSH 100 UNIT/ML IV SOLN
500.0000 [IU] | Freq: Once | INTRAVENOUS | Status: AC
  Administered 2017-05-24: 500 [IU] via INTRAVENOUS

## 2017-05-24 MED ORDER — IOPAMIDOL (ISOVUE-300) INJECTION 61%
INTRAVENOUS | Status: AC
  Filled 2017-05-24: qty 30

## 2017-05-24 MED ORDER — IOPAMIDOL (ISOVUE-300) INJECTION 61%
INTRAVENOUS | Status: AC
  Administered 2017-05-24: 100 mL via INTRAVENOUS
  Filled 2017-05-24: qty 100

## 2017-05-24 MED ORDER — IOPAMIDOL (ISOVUE-300) INJECTION 61%
INTRAVENOUS | Status: AC
  Filled 2017-05-24: qty 100

## 2017-05-26 ENCOUNTER — Other Ambulatory Visit (HOSPITAL_BASED_OUTPATIENT_CLINIC_OR_DEPARTMENT_OTHER): Payer: Medicare Other

## 2017-05-26 ENCOUNTER — Ambulatory Visit (HOSPITAL_BASED_OUTPATIENT_CLINIC_OR_DEPARTMENT_OTHER): Payer: Medicare Other | Admitting: Oncology

## 2017-05-26 ENCOUNTER — Ambulatory Visit (HOSPITAL_BASED_OUTPATIENT_CLINIC_OR_DEPARTMENT_OTHER): Payer: Medicare Other

## 2017-05-26 ENCOUNTER — Ambulatory Visit: Payer: Medicare Other

## 2017-05-26 ENCOUNTER — Telehealth: Payer: Self-pay | Admitting: Oncology

## 2017-05-26 VITALS — BP 140/87 | HR 82 | Temp 97.6°F | Resp 18 | Ht 64.0 in | Wt 165.9 lb

## 2017-05-26 DIAGNOSIS — C189 Malignant neoplasm of colon, unspecified: Secondary | ICD-10-CM

## 2017-05-26 DIAGNOSIS — C786 Secondary malignant neoplasm of retroperitoneum and peritoneum: Secondary | ICD-10-CM

## 2017-05-26 DIAGNOSIS — Z79899 Other long term (current) drug therapy: Secondary | ICD-10-CM

## 2017-05-26 DIAGNOSIS — K6389 Other specified diseases of intestine: Secondary | ICD-10-CM

## 2017-05-26 DIAGNOSIS — C180 Malignant neoplasm of cecum: Secondary | ICD-10-CM | POA: Diagnosis not present

## 2017-05-26 DIAGNOSIS — C787 Secondary malignant neoplasm of liver and intrahepatic bile duct: Principal | ICD-10-CM

## 2017-05-26 DIAGNOSIS — Z5112 Encounter for antineoplastic immunotherapy: Secondary | ICD-10-CM

## 2017-05-26 DIAGNOSIS — Z5111 Encounter for antineoplastic chemotherapy: Secondary | ICD-10-CM

## 2017-05-26 DIAGNOSIS — Z95828 Presence of other vascular implants and grafts: Secondary | ICD-10-CM

## 2017-05-26 DIAGNOSIS — K769 Liver disease, unspecified: Secondary | ICD-10-CM

## 2017-05-26 LAB — CBC WITH DIFFERENTIAL/PLATELET
BASO%: 0.7 % (ref 0.0–2.0)
BASOS ABS: 0 10*3/uL (ref 0.0–0.1)
EOS ABS: 0.1 10*3/uL (ref 0.0–0.5)
EOS%: 2.1 % (ref 0.0–7.0)
HEMATOCRIT: 37.6 % (ref 34.8–46.6)
HEMOGLOBIN: 12.3 g/dL (ref 11.6–15.9)
LYMPH#: 1.2 10*3/uL (ref 0.9–3.3)
LYMPH%: 27.8 % (ref 14.0–49.7)
MCH: 32.8 pg (ref 25.1–34.0)
MCHC: 32.7 g/dL (ref 31.5–36.0)
MCV: 100.3 fL (ref 79.5–101.0)
MONO#: 0.5 10*3/uL (ref 0.1–0.9)
MONO%: 11.6 % (ref 0.0–14.0)
NEUT#: 2.5 10*3/uL (ref 1.5–6.5)
NEUT%: 57.8 % (ref 38.4–76.8)
Platelets: 180 10*3/uL (ref 145–400)
RBC: 3.75 10*6/uL (ref 3.70–5.45)
RDW: 17.4 % — ABNORMAL HIGH (ref 11.2–14.5)
WBC: 4.3 10*3/uL (ref 3.9–10.3)

## 2017-05-26 LAB — COMPREHENSIVE METABOLIC PANEL
ALT: 11 U/L (ref 0–55)
AST: 18 U/L (ref 5–34)
Albumin: 3.5 g/dL (ref 3.5–5.0)
Alkaline Phosphatase: 59 U/L (ref 40–150)
Anion Gap: 9 mEq/L (ref 3–11)
BILIRUBIN TOTAL: 1.11 mg/dL (ref 0.20–1.20)
BUN: 14.5 mg/dL (ref 7.0–26.0)
CHLORIDE: 109 meq/L (ref 98–109)
CO2: 23 meq/L (ref 22–29)
CREATININE: 1 mg/dL (ref 0.6–1.1)
Calcium: 9.4 mg/dL (ref 8.4–10.4)
GLUCOSE: 95 mg/dL (ref 70–140)
Potassium: 4 mEq/L (ref 3.5–5.1)
SODIUM: 141 meq/L (ref 136–145)
TOTAL PROTEIN: 7.2 g/dL (ref 6.4–8.3)

## 2017-05-26 LAB — CEA (IN HOUSE-CHCC): CEA (CHCC-In House): 3.79 ng/mL (ref 0.00–5.00)

## 2017-05-26 LAB — UA PROTEIN, DIPSTICK - CHCC: PROTEIN: NEGATIVE mg/dL

## 2017-05-26 MED ORDER — LORAZEPAM 1 MG PO TABS
ORAL_TABLET | ORAL | Status: AC
Start: 2017-05-26 — End: 2017-05-26
  Filled 2017-05-26: qty 1

## 2017-05-26 MED ORDER — SODIUM CHLORIDE 0.9 % IV SOLN
Freq: Once | INTRAVENOUS | Status: AC
  Administered 2017-05-26: 09:00:00 via INTRAVENOUS

## 2017-05-26 MED ORDER — LORAZEPAM 1 MG PO TABS
1.0000 mg | ORAL_TABLET | Freq: Once | ORAL | Status: AC
  Administered 2017-05-26: 1 mg via ORAL

## 2017-05-26 MED ORDER — DEXAMETHASONE SODIUM PHOSPHATE 10 MG/ML IJ SOLN
10.0000 mg | Freq: Once | INTRAMUSCULAR | Status: AC
  Administered 2017-05-26: 10 mg via INTRAVENOUS

## 2017-05-26 MED ORDER — FLUOROURACIL CHEMO INJECTION 2.5 GM/50ML
400.0000 mg/m2 | Freq: Once | INTRAVENOUS | Status: AC
  Administered 2017-05-26: 750 mg via INTRAVENOUS
  Filled 2017-05-26: qty 15

## 2017-05-26 MED ORDER — LEUCOVORIN CALCIUM INJECTION 350 MG
400.0000 mg/m2 | Freq: Once | INTRAMUSCULAR | Status: AC
  Administered 2017-05-26: 752 mg via INTRAVENOUS
  Filled 2017-05-26: qty 25

## 2017-05-26 MED ORDER — SODIUM CHLORIDE 0.9 % IV SOLN
2400.0000 mg/m2 | INTRAVENOUS | Status: DC
  Administered 2017-05-26: 4500 mg via INTRAVENOUS
  Filled 2017-05-26: qty 90

## 2017-05-26 MED ORDER — PALONOSETRON HCL INJECTION 0.25 MG/5ML
INTRAVENOUS | Status: AC
  Filled 2017-05-26: qty 5

## 2017-05-26 MED ORDER — SODIUM CHLORIDE 0.9 % IV SOLN
5.0000 mg/kg | Freq: Once | INTRAVENOUS | Status: AC
  Administered 2017-05-26: 400 mg via INTRAVENOUS
  Filled 2017-05-26: qty 16

## 2017-05-26 MED ORDER — DEXAMETHASONE SODIUM PHOSPHATE 10 MG/ML IJ SOLN
INTRAMUSCULAR | Status: AC
  Filled 2017-05-26: qty 1

## 2017-05-26 MED ORDER — PALONOSETRON HCL INJECTION 0.25 MG/5ML
0.2500 mg | Freq: Once | INTRAVENOUS | Status: AC
  Administered 2017-05-26: 0.25 mg via INTRAVENOUS

## 2017-05-26 MED ORDER — SODIUM CHLORIDE 0.9 % IJ SOLN
10.0000 mL | INTRAMUSCULAR | Status: DC | PRN
  Administered 2017-05-26: 10 mL via INTRAVENOUS
  Filled 2017-05-26: qty 10

## 2017-05-26 NOTE — Progress Notes (Signed)
Hematology and Oncology Follow Up Visit  Margaret Elliott [id number] 03-22-1951    05/26/17   Principle Diagnosis: 66 year old woman diagnosed with colon cancer in June 2014. She had presented with an abdominal pain and found to have a cecal mass resulting in obstruction and appendicitis. She had Likely has stage IV disease with a liver mass.    Prior Therapy:  She is status post laparoscopic laparotomy, evacuation of a pelvic abscess and right hemicolectomy with ileocolonic anastomosis done on December 09, 2012. The pathology showed an invasive, well- differentiated colorectal adenocarcinoma. Tumor invades through the muscularis propria, with 2/18 lymph nodes involved, with the pathological staging T3 N1b disease. Her tumor was found to be microsatellite stable. KRAS wild type.   She is also status post Port-A-Cath insertion that was done on 01/05/2013.  FOLFOX chemotherapy started 01/18/2013. Avastin to be added with cycle 2 on 02/01/2013. She is S/P 12 cycles completed in 05/2013.   She is a status post microwave ablation of metastatic lesion in the posterior segment of the right lobe of the liver completed on 09/28/2014 and repeated on 11/30/2014.  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   Current therapy:  FOLFIRI and a Avastin salvage therapy started on 12/10/2015.   She has received 5-FU, leucovorin and a Avastin only starting with cycle 10.    Treatment was held after 11/18/2016 and resumed on 02/03/2017.  She continues to receive that every 2 weeks for maintenance purposes.   Interim History:  Margaret Elliott presents today for a followup visit. Since her last visit, she reports doing well without any recent complaints.   She denied any nausea, vomiting. She denied early satiety or constipation. She denied any worsening peripheral neuropathy. She denied any other GI symptoms.  Her quality of life and performance  status remains excellent without any further decline.  She is not report any headaches, blurry vision, syncope or seizures. She does not report any fevers, chills or sweats. She does not report any chest pain, palpitation or orthopnea. She does not report any wheezing, hemoptysis or hematemesis. She does not report any frequency urgency or hesitancy. Does not report any lymphadenopathy or petechiae. Rest of her review of systems unremarkable.   Medications: I have reviewed the patient's current medications.  Current Outpatient Prescriptions  Medication Sig Dispense Refill  . acetaminophen (TYLENOL) 500 MG tablet Take 500 mg by mouth every 6 (six) hours as needed for pain.      . diphenhydrAMINE (BENADRYL) 25 mg capsule Take 25-50 mg by mouth daily as needed for itching. For allergic reaction      . lidocaine-prilocaine (EMLA) cream Apply 1 application topically as needed. Apply approx 1/2 tsp to skin over port, prior to chemotherapy treatments  1 kit  3  . Multiple Vitamin (MULTIVITAMIN) tablet Take 1 tablet by mouth daily.      . ondansetron (ZOFRAN) 8 MG tablet Take 1 tablet (8 mg total) by mouth every 8 (eight) hours as needed for nausea.  30 tablet  1   No current facility-administered medications for this visit.     Allergies: No Known Allergies  Past Medical History, Surgical history, Social history, and Family History were reviewed and updated.    Physical Exam:  Blood pressure 140/87, pulse 82, temperature 97.6 F (36.4 C), temperature source Oral, resp. rate 18, height '5\' 4"'$  (1.626 m), weight 165 lb 14.4 oz (75.3 kg), SpO2 100 %. ECOG: 0  General appearance: Alert, awake woman without distress. Oropharynx: No oral ulcers or lesions. Neck: no adenopathy  Lymph nodes: Cervical, supraclavicular, and axillary nodes normal. Heart:regular rate and rhythm, S1, S2 normal, no murmur, rubs or gallops. Lung:chest clear, no wheezing, rales, normal symmetric air entry.  Chest wall  examination: Revealed Port-A-Cath without any erythema or drainage. Abdomen: soft, non-tender, without masses or nodularity noted. No  Neurological: No new neurological deficits noted.  CBC    Component Value Date/Time   WBC 4.3 05/26/2017 0809   WBC 4.0 11/20/2015 0735   RBC 3.75 05/26/2017 0809   RBC 3.95 11/20/2015 0735   HGB 12.3 05/26/2017 0809   HCT 37.6 05/26/2017 0809   PLT 180 05/26/2017 0809   MCV 100.3 05/26/2017 0809   MCH 32.8 05/26/2017 0809   MCH 30.6 11/20/2015 0735   MCHC 32.7 05/26/2017 0809   MCHC 34.1 11/20/2015 0735   RDW 17.4 (H) 05/26/2017 0809   LYMPHSABS 1.2 05/26/2017 0809   MONOABS 0.5 05/26/2017 0809   EOSABS 0.1 05/26/2017 0809   BASOSABS 0.0 05/26/2017 0809    Results for EARLY, ORD (MRN [id number]) as of 05/26/2017 08:38  Ref. Range 04/14/2017 08:09 04/28/2017 08:30 05/12/2017 08:23  CEA ([facility]) Latest Ref Range: 0.00 - 5.00 ng/mL 3.83 3.66 4.03   EXAM: CT CHEST, ABDOMEN, AND PELVIS WITH CONTRAST  TECHNIQUE: Multidetector CT imaging of the chest, abdomen and pelvis was performed following the standard protocol during bolus administration of intravenous contrast.  CONTRAST:  100 cc Isovue 300  COMPARISON:  Multiple exams, including 12/15/2016  FINDINGS: CT CHEST FINDINGS  Cardiovascular: Aortic arch and left anterior descending coronary artery atherosclerotic calcification. Left Port-A-Cath tip: Cavoatrial junction.  Mediastinum/Nodes: No overtly pathologic thoracic adenopathy. As before the left infrahilar node measures 0.7 cm in short axis on image 30/2. There is potentially mild wall thickening in the distal esophagus.  Lungs/Pleura: 5 by 4 mm left upper lobe nodule on image 71/6, stable in size. 2 by 3 mm right upper lobe nodule on image 51/6, stable. 2 mm left apicoposterior segment left upper lobe nodule on image 34/6, stable. No new or enlarging pulmonary nodule.  Musculoskeletal: 1.0 cm area of  sclerosis in the left posterolateral sixth rib, no change from 2014 and not previously hypermetabolic. Thoracic spondylosis.  CT ABDOMEN PELVIS FINDINGS  Hepatobiliary: Along the right posterior capsular margin of the liver there is a 4.7 by 1.6 by 2.5 cm (volume = 9.8 cm^3)lesion, formerly 5.0 by 1.9 by 2.9 cm (volume = 14 cm^3). Several tiny hypodense lesions in segment 4a and segment 4b of the liver also remain stable across multiple exams. No new liver lesion. Gallbladder unremarkable.  Pancreas: Unremarkable  Spleen: Unremarkable  Adrenals/Urinary Tract: Simple appearing right kidney upper pole cyst. Several other small hypodense lesions of the left kidney are likely cysts but technically too small to characterize.  Stomach/Bowel: Prior partial right hemicolectomy.  Vascular/Lymphatic: Aortoiliac atherosclerotic vascular disease. No appreciable adenopathy in the abdomen or pelvis.  Reproductive: Uterus absent. Hypodense left adnexal lesion 2.1 by 1.5 cm, mildly reduced in size from 12/15/2016  Other: The distribution and amount of multifocal omental tumor is unchanged compared to the prior exam with multiple omental nodules. A band of omental tumor on image 80/ 2 measures 1.0 cm in thickness, stable from the prior exam.  Musculoskeletal: Lumbar spondylosis and degenerative disc disease with grade 1 anterolisthesis at L4-5, and foraminal impingement at the L4-5 and L5-S1 levels.  Small ventral supraumbilical hernias contain  omental adipose tissue.  IMPRESSION: 1. Stable and unchanged appearance of multifocal omental deposits of tumor. 2. Mild reduction in overall volume of the ablation site posteriorly in the right hepatic lobe without other appreciable changes in this location. 3. Several additional tiny foci of hypodensity in segment 4 of the liver are chronically stable. 4. Reduced size of the cystic lesion in the left ovary. 5. Chronically stable  size of several small pulmonary nodules, likely benign. 6. Other imaging findings of potential clinical significance: Aortic Atherosclerosis (ICD10-I70.0). Coronary atherosclerosis. Prior partial right hemicolectomy. Lower lumbar impingement due to spondylosis and degenerative disc disease. Small ventral hernia is noted.   Impression and Plan:  66 year old woman with the following issues:   1. Advanced Colon cancer. She presented with a large right cecal mass causing obstruction and abscess and appendicitis. After surgical resection she received 12 cycles of chemotherapy with PET/CT scan on 09/12/2013 showed no residual disease actually in the liver that is hypermetabolic.  She is status post liver directed therapy for metastatic disease and to the liver stream and was given in June 2016.   PET/CT scan on 11/08/2015 showed progressive omental and peritoneal metastasis. Biopsy proven to be adenocarcinoma of the colon that is KRAS wild-type but NRAS mutated.  She is S/P salvage FOLFIRI and a Avastin that is well-tolerated. CPT-11 was removed for better tolerance after cycle 10.  CT scan obtain on 06/16/2016 showed reasonable response to therapy with decrease in the size of her omental metastasis. She declined primary surgical therapy that included omentectomy.  She is currently receiving maintenance chemotherapy with 5-FU any Avastin.   CT scan on 05/24/2017 was personally reviewed and discussed with the patient today.  She continues to have stable disease without any obvious disease progression.  The plan is to continue with maintenance therapy as she is receiving maintenance 5-FU and leucovorin.  She will take a break from therapy and resume in January 2018 after today's treatment.  2. Intravenous access. Port-A-Cath in place without complications.  No complications noted.  3. Neuropathy: Reasonably managed without complications.  4. Antiemetics: Compazine is effective in managing her  nausea.   5. Discoloration of her palms: Related to 5-FU without any evidence of erythema or induration.    6. Followup: Will be in 4 weeks for the next cycle of maintenance chemotherapy.   Zola Button MD 05/26/17

## 2017-05-26 NOTE — Patient Instructions (Signed)
[facility] Discharge Instructions for Patients Receiving Chemotherapy  Today you received the following chemotherapy agents avastin/leucovorin/florouracil   To help prevent nausea and vomiting after your treatment, we encourage you to take your nausea medication as directed  If you develop nausea and vomiting that is not controlled by your nausea medication, call the clinic.   BELOW ARE SYMPTOMS THAT SHOULD BE REPORTED IMMEDIATELY:  *FEVER GREATER THAN 100.5 F  *CHILLS WITH OR WITHOUT FEVER  NAUSEA AND VOMITING THAT IS NOT CONTROLLED WITH YOUR NAUSEA MEDICATION  *UNUSUAL SHORTNESS OF BREATH  *UNUSUAL BRUISING OR BLEEDING  TENDERNESS IN MOUTH AND THROAT WITH OR WITHOUT PRESENCE OF ULCERS  *URINARY PROBLEMS  *BOWEL PROBLEMS  UNUSUAL RASH Items with * indicate a potential emergency and should be followed up as soon as possible.  Feel free to call the clinic you have any questions or concerns. The clinic phone number is [phone].

## 2017-05-26 NOTE — Telephone Encounter (Signed)
Gave avs and calendar for January 2019 °

## 2017-05-28 ENCOUNTER — Ambulatory Visit (HOSPITAL_BASED_OUTPATIENT_CLINIC_OR_DEPARTMENT_OTHER): Payer: Medicare Other

## 2017-05-28 VITALS — BP 128/77 | HR 77 | Temp 98.3°F | Resp 17

## 2017-05-28 DIAGNOSIS — C189 Malignant neoplasm of colon, unspecified: Secondary | ICD-10-CM

## 2017-05-28 DIAGNOSIS — K6389 Other specified diseases of intestine: Secondary | ICD-10-CM

## 2017-05-28 DIAGNOSIS — Z452 Encounter for adjustment and management of vascular access device: Secondary | ICD-10-CM | POA: Diagnosis not present

## 2017-05-28 DIAGNOSIS — C180 Malignant neoplasm of cecum: Secondary | ICD-10-CM

## 2017-05-28 DIAGNOSIS — K769 Liver disease, unspecified: Secondary | ICD-10-CM

## 2017-05-28 MED ORDER — SODIUM CHLORIDE 0.9% FLUSH
10.0000 mL | INTRAVENOUS | Status: DC | PRN
  Administered 2017-05-28: 10 mL
  Filled 2017-05-28: qty 10

## 2017-05-28 MED ORDER — HEPARIN SOD (PORK) LOCK FLUSH 100 UNIT/ML IV SOLN
500.0000 [IU] | Freq: Once | INTRAVENOUS | Status: AC | PRN
  Administered 2017-05-28: 500 [IU]
  Filled 2017-05-28: qty 5

## 2017-05-28 NOTE — Patient Instructions (Signed)
Implanted Port Home Guide An implanted port is a type of central line that is placed under the skin. Central lines are used to provide IV access when treatment or nutrition needs to be given through a person's veins. Implanted ports are used for long-term IV access. An implanted port may be placed because:  You need IV medicine that would be irritating to the small veins in your hands or arms.  You need long-term IV medicines, such as antibiotics.  You need IV nutrition for a long period.  You need frequent blood draws for lab tests.  You need dialysis.  Implanted ports are usually placed in the chest area, but they can also be placed in the upper arm, the abdomen, or the leg. An implanted port has two main parts:  Reservoir. The reservoir is round and will appear as a small, raised area under your skin. The reservoir is the part where a needle is inserted to give medicines or draw blood.  Catheter. The catheter is a thin, flexible tube that extends from the reservoir. The catheter is placed into a large vein. Medicine that is inserted into the reservoir goes into the catheter and then into the vein.  How will I care for my incision site? Do not get the incision site wet. Bathe or shower as directed by your health care provider. How is my port accessed? Special steps must be taken to access the port:  Before the port is accessed, a numbing cream can be placed on the skin. This helps numb the skin over the port site.  Your health care provider uses a sterile technique to access the port. ? Your health care provider must put on a mask and sterile gloves. ? The skin over your port is cleaned carefully with an antiseptic and allowed to dry. ? The port is gently pinched between sterile gloves, and a needle is inserted into the port.  Only "non-coring" port needles should be used to access the port. Once the port is accessed, a blood return should be checked. This helps ensure that the port  is in the vein and is not clogged.  If your port needs to remain accessed for a constant infusion, a clear (transparent) bandage will be placed over the needle site. The bandage and needle will need to be changed every week, or as directed by your health care provider.  Keep the bandage covering the needle clean and dry. Do not get it wet. Follow your health care provider's instructions on how to take a shower or bath while the port is accessed.  If your port does not need to stay accessed, no bandage is needed over the port.  What is flushing? Flushing helps keep the port from getting clogged. Follow your health care provider's instructions on how and when to flush the port. Ports are usually flushed with saline solution or a medicine called heparin. The need for flushing will depend on how the port is used.  If the port is used for intermittent medicines or blood draws, the port will need to be flushed: ? After medicines have been given. ? After blood has been drawn. ? As part of routine maintenance.  If a constant infusion is running, the port may not need to be flushed.  How long will my port stay implanted? The port can stay in for as long as your health care provider thinks it is needed. When it is time for the port to come out, surgery will be   done to remove it. The procedure is similar to the one performed when the port was put in. When should I seek immediate medical care? When you have an implanted port, you should seek immediate medical care if:  You notice a bad smell coming from the incision site.  You have swelling, redness, or drainage at the incision site.  You have more swelling or pain at the port site or the surrounding area.  You have a fever that is not controlled with medicine.  This information is not intended to replace advice given to you by your health care provider. Make sure you discuss any questions you have with your health care provider. Document  Released: 06/08/2005 Document Revised: 11/14/2015 Document Reviewed: 02/13/2013 Elsevier Interactive Patient Education  2017 [employer]  

## 2017-06-01 IMAGING — CT CT ABD-PELV W/ CM
2 of 5 series · 15 of 46 positions shown, 17 images · non-contrast
Comparison: PET-CT on 11/08/15 and AP CT on 07/18/2015

CLINICAL DATA: Followup metastatic colon carcinoma to liver.
Ongoing chemotherapy. Previous ablation of right hepatic lobe
metastasis.

EXAM:
CT CHEST, ABDOMEN AND PELVIS WITHOUT CONTRAST
TECHNIQUE: Multidetector CT imaging of the chest, abdomen and pelvis was
performed following the standard protocol without IV contrast.

[Series 2: cap with st · axial · 0.73mm/px · z∈[-559,-59]mm · 12 of 120 slices shown, 14 images]
[im 10/120  soft-tissue]
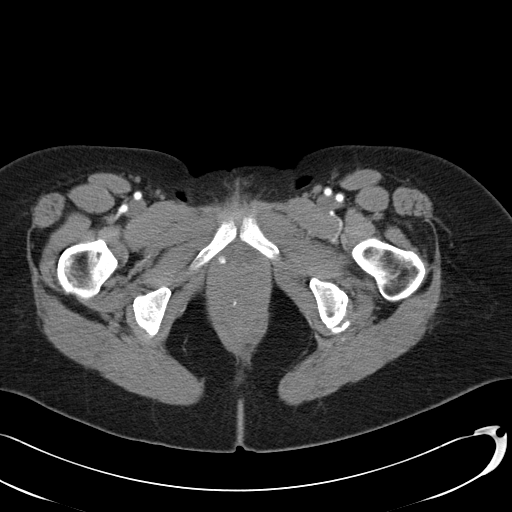
[im 10/120  bone]
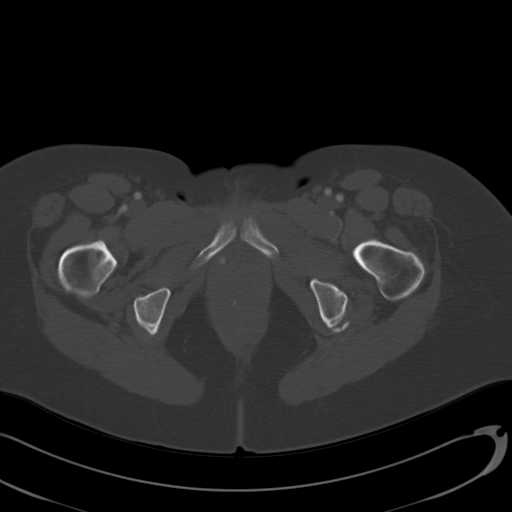
[im 19/120  soft-tissue]
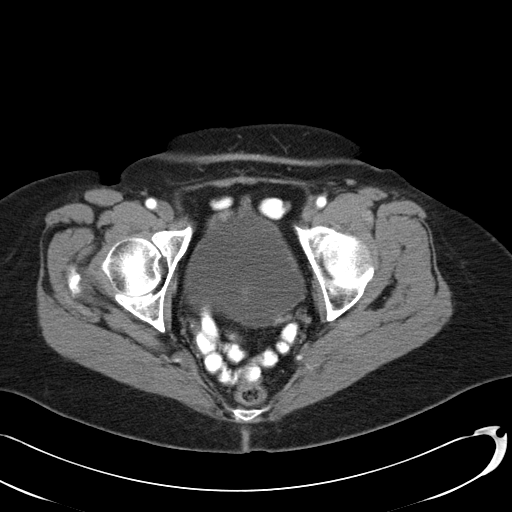
[im 28/120  soft-tissue]
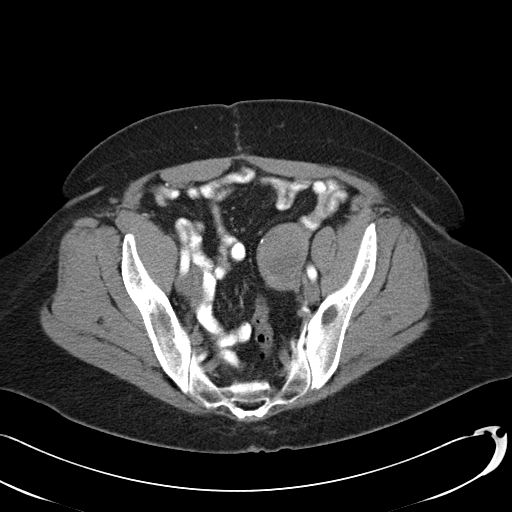
[im 37/120  soft-tissue]
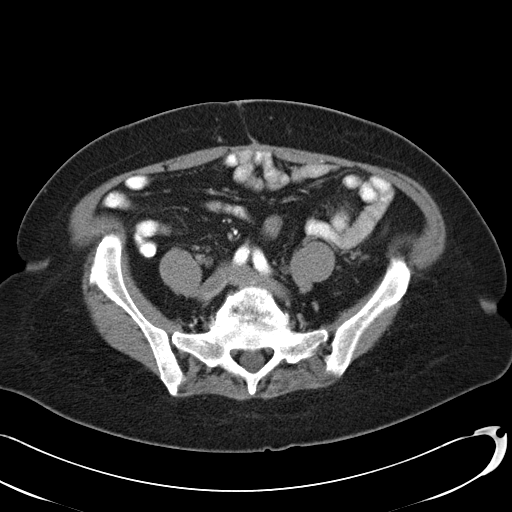
[im 46/120  soft-tissue]
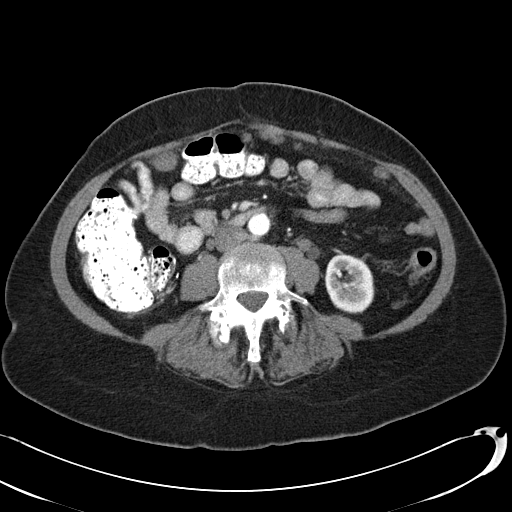
[im 55/120  soft-tissue]
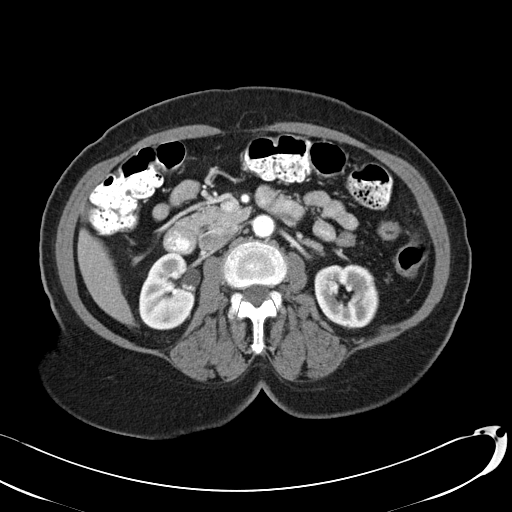
[im 65/120  soft-tissue]
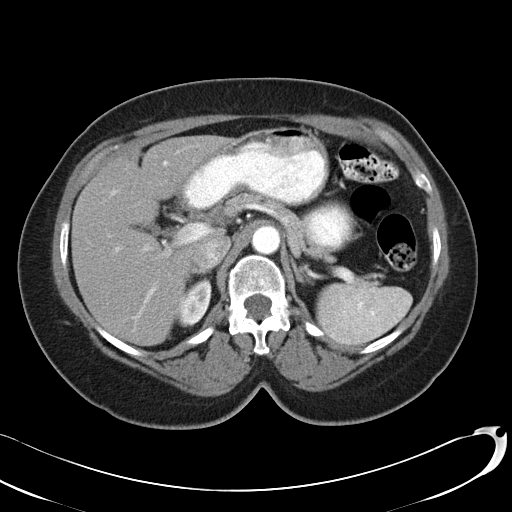
[im 74/120  soft-tissue]
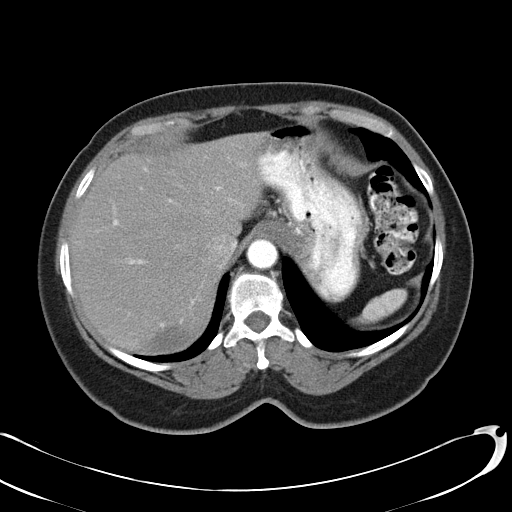
[im 83/120  soft-tissue]
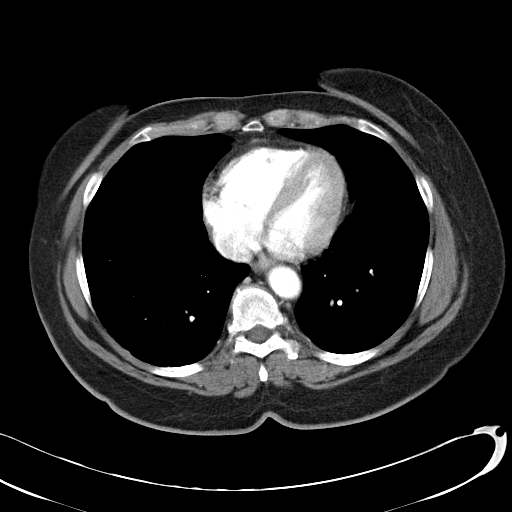
[im 83/120  bone]
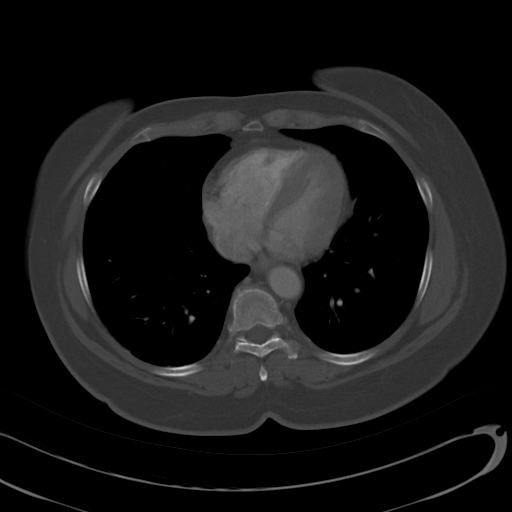
[im 92/120  soft-tissue]
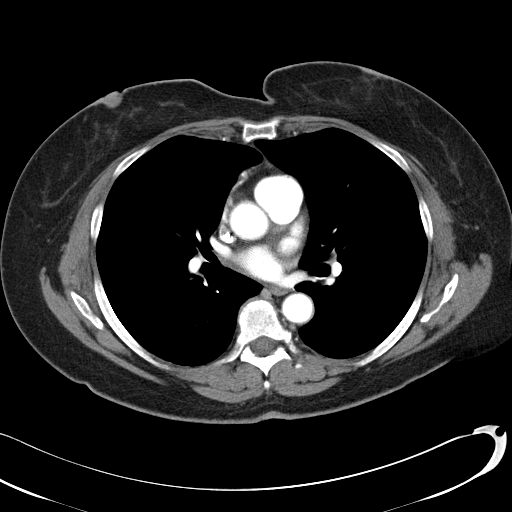
[im 101/120  soft-tissue]
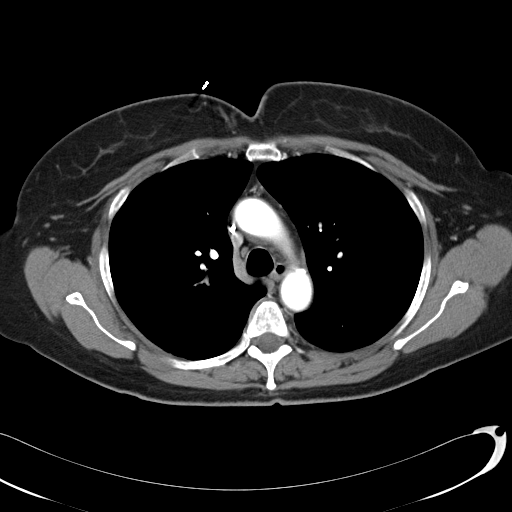
[im 110/120  soft-tissue]
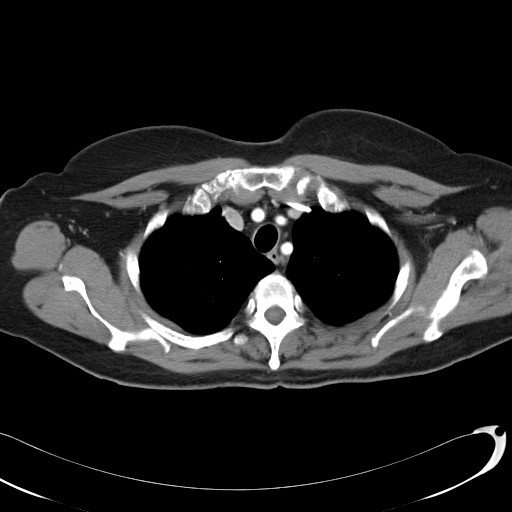

[Series 603: <mpr thick range(1)> · coronal · 1.17mm/px · 3 of 134 slices shown]
[im 45/134  soft-tissue]
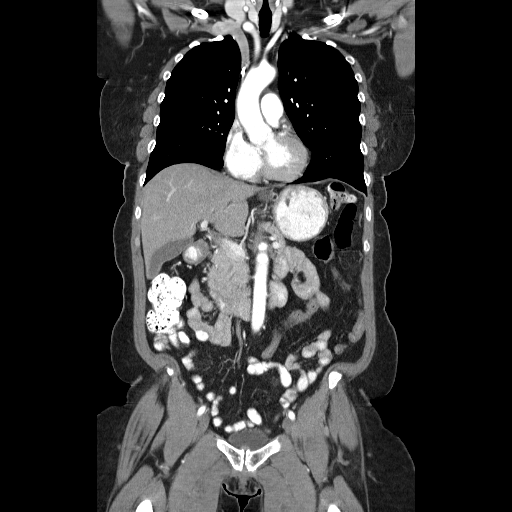
[im 60/134  soft-tissue]
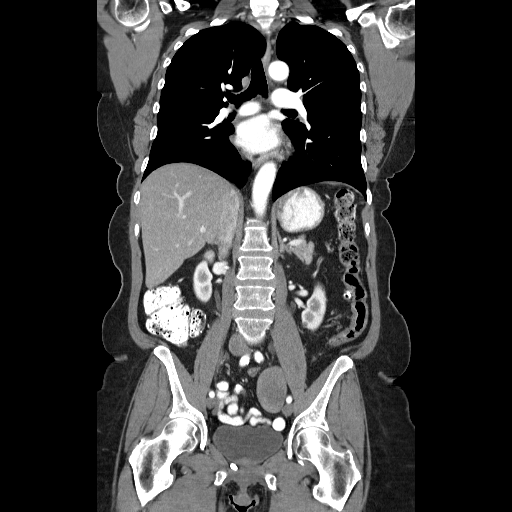
[im 74/134  soft-tissue]
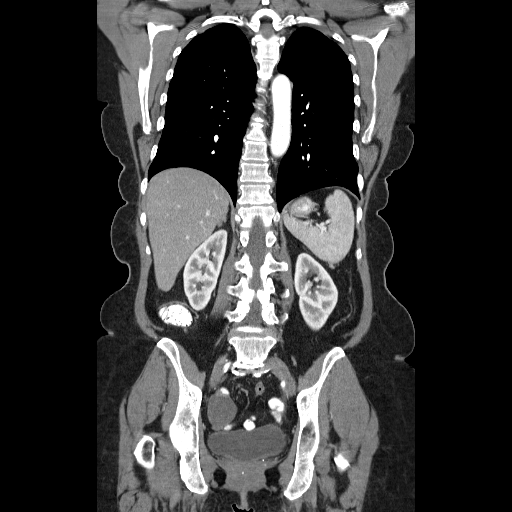

[15 of 46 positions shown; findings below may reference images not displayed]

FINDINGS: CT CHEST FINDINGS

Cardiovascular:  No significant abnormality.

Mediastinum/Lymph Nodes: No masses or pathologically enlarged lymph
nodes identified.

Lungs/Pleura: 4 mm pulmonary nodule in the anterior left upper lobe
on image 74 remains stable compared to previous studies 03/13/2013,
consistent with benign etiology. No new or enlarging pulmonary
nodules or masses identified. No evidence of pulmonary infiltrate or
pleural effusion.

Musculoskeletal/Soft Tissues: No suspicious bone lesions or other
significant chest wall abnormality.

CT ABDOMEN AND PELVIS FINDINGS

Hepatobiliary: Ablation defect in posterior segment of right hepatic
lobe remains stable. No liver masses are identified. Gallbladder is
unremarkable.

Pancreas: No mass, inflammatory changes, or other significant
abnormality identified.

Spleen:  Within normal limits in size and appearance.

Adrenals:  No masses identified.

Kidneys/Urinary Tract: No evidence of masses or hydronephrosis.
Benign-appearing right renal cyst again noted.

Stomach/Bowel/Peritoneum: No evidence of bowel obstruction.
Descending colonic diverticulosis again seen, without evidence of
diverticulitis. Small omental soft tissue nodules are again seen
bilaterally, without significant change. Largest nodule seen along
the anterior wall the descending colon on image 63/2 measures 2.3 x
2.0 cm compared to 2.2 by 1.8 cm previously. No definite new nodules
or masses identified.

Vascular/Lymphatic: No pathologically enlarged lymph nodes
identified. No abdominal aortic aneurysm seen.

Reproductive: Prior hysterectomy. Bilateral adnexal masses are seen.
The right adnexal mass measures 4.9 x 3.5 cm today compared to 2.7 x
4.1 cm on 11/04/2015. Left adnexal mass measures 5.0 x 3.9 cm on
today's study compared to 4.6 x 3.8 cm previously.

Other:  None.

Musculoskeletal:  No suspicious bone lesions identified.
IMPRESSION: Mild increase in size of bilateral adnexal masses, suspicious for
bilateral ovarian metastasis (Rafael tumors).

No significant change in omental metastatic disease. No evidence of
ascites.

Stable post ablation changes in posterior right hepatic lobe. No
liver metastases identified.

No evidence of metastatic disease within the thorax.

## 2017-06-30 ENCOUNTER — Telehealth: Payer: Self-pay | Admitting: Oncology

## 2017-06-30 ENCOUNTER — Inpatient Hospital Stay (HOSPITAL_BASED_OUTPATIENT_CLINIC_OR_DEPARTMENT_OTHER): Payer: Medicare Other | Admitting: Oncology

## 2017-06-30 ENCOUNTER — Inpatient Hospital Stay: Payer: Medicare Other | Attending: Oncology

## 2017-06-30 ENCOUNTER — Inpatient Hospital Stay: Payer: Medicare Other

## 2017-06-30 VITALS — BP 111/75 | HR 77

## 2017-06-30 DIAGNOSIS — C189 Malignant neoplasm of colon, unspecified: Secondary | ICD-10-CM

## 2017-06-30 DIAGNOSIS — C786 Secondary malignant neoplasm of retroperitoneum and peritoneum: Secondary | ICD-10-CM

## 2017-06-30 DIAGNOSIS — M2560 Stiffness of unspecified joint, not elsewhere classified: Secondary | ICD-10-CM | POA: Diagnosis not present

## 2017-06-30 DIAGNOSIS — T451X5S Adverse effect of antineoplastic and immunosuppressive drugs, sequela: Secondary | ICD-10-CM

## 2017-06-30 DIAGNOSIS — K769 Liver disease, unspecified: Secondary | ICD-10-CM

## 2017-06-30 DIAGNOSIS — Z9221 Personal history of antineoplastic chemotherapy: Secondary | ICD-10-CM

## 2017-06-30 DIAGNOSIS — C787 Secondary malignant neoplasm of liver and intrahepatic bile duct: Secondary | ICD-10-CM

## 2017-06-30 DIAGNOSIS — G620 Drug-induced polyneuropathy: Secondary | ICD-10-CM | POA: Insufficient documentation

## 2017-06-30 DIAGNOSIS — Z9049 Acquired absence of other specified parts of digestive tract: Secondary | ICD-10-CM | POA: Diagnosis not present

## 2017-06-30 DIAGNOSIS — F329 Major depressive disorder, single episode, unspecified: Secondary | ICD-10-CM | POA: Diagnosis not present

## 2017-06-30 DIAGNOSIS — C180 Malignant neoplasm of cecum: Secondary | ICD-10-CM | POA: Insufficient documentation

## 2017-06-30 DIAGNOSIS — Z5111 Encounter for antineoplastic chemotherapy: Secondary | ICD-10-CM | POA: Insufficient documentation

## 2017-06-30 DIAGNOSIS — Z79899 Other long term (current) drug therapy: Secondary | ICD-10-CM | POA: Insufficient documentation

## 2017-06-30 DIAGNOSIS — Z95828 Presence of other vascular implants and grafts: Secondary | ICD-10-CM | POA: Diagnosis not present

## 2017-06-30 DIAGNOSIS — Z5112 Encounter for antineoplastic immunotherapy: Secondary | ICD-10-CM | POA: Insufficient documentation

## 2017-06-30 DIAGNOSIS — K6389 Other specified diseases of intestine: Secondary | ICD-10-CM

## 2017-06-30 DIAGNOSIS — Z452 Encounter for adjustment and management of vascular access device: Secondary | ICD-10-CM | POA: Diagnosis not present

## 2017-06-30 LAB — CBC WITH DIFFERENTIAL/PLATELET
ABS GRANULOCYTE: 2.3 10*3/uL (ref 1.5–6.5)
Basophils Absolute: 0 10*3/uL (ref 0.0–0.1)
Basophils Relative: 1 %
EOS PCT: 2 %
Eosinophils Absolute: 0.1 10*3/uL (ref 0.0–0.5)
HEMATOCRIT: 39 % (ref 34.8–46.6)
Hemoglobin: 12.8 g/dL (ref 11.6–15.9)
LYMPHS PCT: 34 %
Lymphs Abs: 1.4 10*3/uL (ref 0.9–3.3)
MCH: 33 pg (ref 25.1–34.0)
MCHC: 32.9 g/dL (ref 31.5–36.0)
MCV: 100.2 fL (ref 79.5–101.0)
MONO ABS: 0.3 10*3/uL (ref 0.1–0.9)
MONOS PCT: 8 %
Neutro Abs: 2.3 10*3/uL (ref 1.5–6.5)
Neutrophils Relative %: 55 %
PLATELETS: 197 10*3/uL (ref 145–400)
RBC: 3.89 MIL/uL (ref 3.70–5.45)
RDW: 15.3 % (ref 11.2–16.1)
WBC: 4.1 10*3/uL (ref 3.9–10.3)

## 2017-06-30 LAB — CEA (IN HOUSE-CHCC): CEA (CHCC-In House): 3.27 ng/mL (ref 0.00–5.00)

## 2017-06-30 LAB — COMPREHENSIVE METABOLIC PANEL
ALT: 13 U/L (ref 0–55)
AST: 18 U/L (ref 5–34)
Albumin: 3.5 g/dL (ref 3.5–5.0)
Alkaline Phosphatase: 74 U/L (ref 40–150)
Anion gap: 7 (ref 3–11)
BILIRUBIN TOTAL: 0.5 mg/dL (ref 0.2–1.2)
BUN: 18 mg/dL (ref 7–26)
CHLORIDE: 109 mmol/L (ref 98–109)
CO2: 23 mmol/L (ref 22–29)
Calcium: 9.2 mg/dL (ref 8.4–10.4)
Creatinine, Ser: 0.99 mg/dL (ref 0.60–1.10)
GFR calc Af Amer: >60 mL/min (ref >60)
GFR, EST NON AFRICAN AMERICAN: 58 mL/min — AB (ref >60)
Glucose, Bld: 112 mg/dL (ref 70–140)
POTASSIUM: 3.9 mmol/L (ref 3.3–4.7)
Sodium: 139 mmol/L (ref 136–145)
TOTAL PROTEIN: 7.2 g/dL (ref 6.4–8.3)

## 2017-06-30 MED ORDER — LORAZEPAM 2 MG/ML IJ SOLN
INTRAMUSCULAR | Status: AC
  Filled 2017-06-30: qty 1

## 2017-06-30 MED ORDER — SODIUM CHLORIDE 0.9% FLUSH
10.0000 mL | INTRAVENOUS | Status: DC | PRN
  Filled 2017-06-30: qty 10

## 2017-06-30 MED ORDER — FLUOROURACIL CHEMO INJECTION 2.5 GM/50ML
400.0000 mg/m2 | Freq: Once | INTRAVENOUS | Status: AC
  Administered 2017-06-30: 750 mg via INTRAVENOUS
  Filled 2017-06-30: qty 15

## 2017-06-30 MED ORDER — SODIUM CHLORIDE 0.9 % IV SOLN
5.0000 mg/kg | Freq: Once | INTRAVENOUS | Status: AC
  Administered 2017-06-30: 400 mg via INTRAVENOUS
  Filled 2017-06-30: qty 16

## 2017-06-30 MED ORDER — PALONOSETRON HCL INJECTION 0.25 MG/5ML
0.2500 mg | Freq: Once | INTRAVENOUS | Status: AC
  Administered 2017-06-30: 0.25 mg via INTRAVENOUS

## 2017-06-30 MED ORDER — HEPARIN SOD (PORK) LOCK FLUSH 100 UNIT/ML IV SOLN
500.0000 [IU] | Freq: Once | INTRAVENOUS | Status: DC | PRN
  Filled 2017-06-30: qty 5

## 2017-06-30 MED ORDER — SODIUM CHLORIDE 0.9 % IJ SOLN
10.0000 mL | INTRAMUSCULAR | Status: DC | PRN
  Administered 2017-06-30: 10 mL via INTRAVENOUS
  Filled 2017-06-30: qty 10

## 2017-06-30 MED ORDER — LEUCOVORIN CALCIUM INJECTION 350 MG
400.0000 mg/m2 | Freq: Once | INTRAMUSCULAR | Status: AC
  Administered 2017-06-30: 752 mg via INTRAVENOUS
  Filled 2017-06-30: qty 37.6

## 2017-06-30 MED ORDER — FLUOROURACIL CHEMO INJECTION 5 GM/100ML
2400.0000 mg/m2 | INTRAVENOUS | Status: DC
  Administered 2017-06-30: 4500 mg via INTRAVENOUS
  Filled 2017-06-30: qty 90

## 2017-06-30 MED ORDER — DEXAMETHASONE SODIUM PHOSPHATE 10 MG/ML IJ SOLN
10.0000 mg | Freq: Once | INTRAMUSCULAR | Status: AC
  Administered 2017-06-30: 10 mg via INTRAVENOUS

## 2017-06-30 MED ORDER — LORAZEPAM 1 MG PO TABS
1.0000 mg | ORAL_TABLET | Freq: Once | ORAL | Status: AC
  Administered 2017-06-30: 1 mg via ORAL

## 2017-06-30 MED ORDER — PALONOSETRON HCL INJECTION 0.25 MG/5ML
INTRAVENOUS | Status: AC
  Filled 2017-06-30: qty 5

## 2017-06-30 MED ORDER — LORAZEPAM 1 MG PO TABS
ORAL_TABLET | ORAL | Status: AC
  Filled 2017-06-30: qty 1

## 2017-06-30 MED ORDER — SODIUM CHLORIDE 0.9 % IV SOLN
Freq: Once | INTRAVENOUS | Status: AC
  Administered 2017-06-30: 10:00:00 via INTRAVENOUS

## 2017-06-30 MED ORDER — DEXAMETHASONE SODIUM PHOSPHATE 10 MG/ML IJ SOLN
INTRAMUSCULAR | Status: AC
Start: 2017-06-30 — End: 2017-06-30
  Filled 2017-06-30: qty 1

## 2017-06-30 NOTE — Patient Instructions (Signed)
[facility] Discharge Instructions for Patients Receiving Chemotherapy  Today you received the following chemotherapy agents Avastin, Leucovorin, 5FU.   To help prevent nausea and vomiting after your treatment, we encourage you to take your nausea medication as prescribed.    If you develop nausea and vomiting that is not controlled by your nausea medication, call the clinic.   BELOW ARE SYMPTOMS THAT SHOULD BE REPORTED IMMEDIATELY:  *FEVER GREATER THAN 100.5 F  *CHILLS WITH OR WITHOUT FEVER  NAUSEA AND VOMITING THAT IS NOT CONTROLLED WITH YOUR NAUSEA MEDICATION  *UNUSUAL SHORTNESS OF BREATH  *UNUSUAL BRUISING OR BLEEDING  TENDERNESS IN MOUTH AND THROAT WITH OR WITHOUT PRESENCE OF ULCERS  *URINARY PROBLEMS  *BOWEL PROBLEMS  UNUSUAL RASH Items with * indicate a potential emergency and should be followed up as soon as possible.  Feel free to call the clinic should you have any questions or concerns. The clinic phone number is [phone].  Please show the CHEMO ALERT CARD at check-in to the Emergency Department and triage nurse.   

## 2017-06-30 NOTE — Telephone Encounter (Signed)
Gave avs and calendar for february °

## 2017-06-30 NOTE — Progress Notes (Addendum)
Hematology and Oncology Follow Up Visit  Margaret Elliott [id number] [birth year]    06/30/17   Principle Diagnosis: 67 year old woman with colon cancer diagnosed in June 2014. She found to have a cecal mass resulting in obstruction and appendicitis. She had stage IV disease with a liver mass.    Prior Therapy:  She is status post laparoscopic laparotomy, evacuation of a pelvic abscess and right hemicolectomy with ileocolonic anastomosis done on December 09, 2012.   The pathology showed an invasive, well- differentiated colorectal adenocarcinoma. Tumor invades through the muscularis propria, with 2/18 lymph nodes involved, with the pathological staging T3 N1b disease. Her tumor was found to be microsatellite stable. KRAS wild type.   FOLFOX chemotherapy started 01/18/2013. Avastin to be added with cycle 2 on 02/01/2013. She is S/P 12 cycles completed in 05/2013.   She is a status post microwave ablation of metastatic lesion in the posterior segment of the right lobe of the liver completed on 09/28/2014 and repeated on 11/30/2014.  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   Current therapy:  FOLFIRI and a Avastin salvage therapy started on 12/10/2015.  She has received 5-FU, leucovorin and a Avastin only starting with cycle 10.    She is currently receiving this therapy intermittently for maintenance purposes.  She is here for her next cycle of therapy.   Interim History:  Margaret Elliott presents is here for a follow-up visit by herself.  She reports she has been doing reasonably well since she was on a treatment break in December 2018 and ready to proceed with the next cycle of chemotherapy.  She has not reported any delayed complications related to therapy.  She denies any worsening neuropathy, nausea or excessive fatigue.  Denies any pain related to her Port-A-Cath.  She denies any bleeding or easy bruising around the  site.  She does report abdominal distention at times and increased gas and gurgling.  She denied any pain, dyspepsia or decrease in her appetite.  She remains active and continues to attend to activities of daily living.   She is not report any headaches, blurry vision, syncope or seizures. She does not report any fevers, chills or sweats.  Denies any weight loss or constitutional symptoms.  She does not report any chest pain, palpitation or orthopnea. She does not report any wheezing, hemoptysis or hematemesis. She does not report any frequency urgency or hesitancy. Does not report any easy bruising or petechiae.  He does report occasional anxiety and depression related to her diagnosis.  She does not report any skeletal complaints of arthralgias or myalgias.  She does not report any lymphadenopathy or pain associated with enlarged lymph glands.  Rest of her review of systems is negative.   Medications: I have reviewed the patient's current medications.  Current Outpatient Prescriptions  Medication Sig Dispense Refill  . acetaminophen (TYLENOL) 500 MG tablet Take 500 mg by mouth every 6 (six) hours as needed for pain.      . diphenhydrAMINE (BENADRYL) 25 mg capsule Take 25-50 mg by mouth daily as needed for itching. For allergic reaction      . lidocaine-prilocaine (EMLA) cream Apply 1 application topically as needed. Apply approx 1/2 tsp to skin over port, prior to chemotherapy treatments  1 kit  3  . Multiple Vitamin (MULTIVITAMIN) tablet Take 1 tablet by mouth daily.      . ondansetron (ZOFRAN) 8 MG tablet Take 1 tablet (  8 mg total) by mouth every 8 (eight) hours as needed for nausea.  30 tablet  1   No current facility-administered medications for this visit.     Allergies: No Known Allergies  Past Medical History, Surgical history, Social history, and Family History reviewed again and remained unchanged.    Physical Exam: Vitals reviewed today and showed a blood pressure 135/88,  weight is 165.6 pounds, pulse 79, respiration 18, temperature is 97.6.  ECOG: 0 General appearance: Ill-appearing woman without any distress. Oropharynx: No oral thrush or ulcers. Head: Normocephalic atraumatic. Eyes: No scleral icterus.  Pupils are equal and round reactive to light. Lymph nodes: No lymphadenopathy noted in the cervical, axillary or inguinal areas. Heart: No murmurs or gallops.  Regular rate and rhythm. Lung: No rhonchi or wheezes.  Clear in all lung fields. Chest wall examination: Revealed Port-A-Cath without any erythema.  Abdomen: No rebound or guarding.  Soft, nontender with good bowel sounds. Neurological: No new neurological deficits noted. Skin: No rashes or lesions. Palatal: No joint swelling.  Normal range of motion.  CBC    Component Value Date/Time   WBC 4.1 06/30/2017 0804   RBC 3.89 06/30/2017 0804   HGB 12.8 06/30/2017 0804   HGB 12.3 05/26/2017 0809   HCT 39.0 06/30/2017 0804   HCT 37.6 05/26/2017 0809   PLT 197 06/30/2017 0804   PLT 180 05/26/2017 0809   MCV 100.2 06/30/2017 0804   MCV 100.3 05/26/2017 0809   MCH 33.0 06/30/2017 0804   MCHC 32.9 06/30/2017 0804   RDW 15.3 06/30/2017 0804   RDW 17.4 (H) 05/26/2017 0809   LYMPHSABS 1.4 06/30/2017 0804   LYMPHSABS 1.2 05/26/2017 0809   MONOABS 0.3 06/30/2017 0804   MONOABS 0.5 05/26/2017 0809   EOSABS 0.1 06/30/2017 0804   EOSABS 0.1 05/26/2017 0809   BASOSABS 0.0 06/30/2017 0804   BASOSABS 0.0 05/26/2017 0809    Results for Margaret Elliott, Margaret Elliott (MRN [id number]) as of 05/26/2017 08:38  Ref. Range 04/14/2017 08:09 04/28/2017 08:30 05/12/2017 08:23  CEA ([facility]) Latest Ref Range: 0.00 - 5.00 ng/mL 3.83 3.66 4.03    Impression and Plan:  67 year old woman with the following issues:   1. Advanced Colon cancer. She presented with a large right cecal mass causing obstruction and abscess and appendicitis. She received 12 cycles of chemotherapy after surgery currently.    PET/CT scan on  09/12/2013 showed no residual disease actually in the liver that is hypermetabolic.  She is status post liver directed therapy for metastatic disease and to the liver stream and was given in June 2016.   PET/CT scan on 11/08/2015 showed progressive omental and peritoneal metastasis. Biopsy proven to be adenocarcinoma of the colon that is KRAS wild-type but NRAS mutated.  She is S/P salvage FOLFIRI and a Avastin that is well-tolerated. CPT-11 was removed for better tolerance after cycle 10.  CT scan obtain on 06/16/2016 showed reasonable response to therapy with decrease in the size of her omental metastasis. She declined primary surgical therapy that included omentectomy.  She remains on maintenance 5-FU, leucovorin and Avastin without any major complications noted.  CT scan obtained in December 2018 was personally reviewed again and discussed with the patient in detail.  She continues to have stable disease at this time without any indication to change therapy.  Extent benefits of resuming maintenance chemotherapy was discussed today.  Complication associated with this chemotherapy long-term was also reviewed.  After discussion today, she is agreeable to continue with the same  maintenance regimen and repeat imaging studies in 6 months.   2. Intravenous access.  Risks and benefits of maintaining Port-A-Cath was discussed today.  Port-A-Cath will be continue to use for IV access at this time.  3. Neuropathy: Unchanged on today's evaluation.  The plan is to continue to monitor at this time.  4. Antiemetics: No nausea or vomiting issues reported at today's visit.  We will continue to provide Compazine for her as needed in the future.  5. Discoloration of her palms: Related to chemotherapy specifically 5-FU.  6.  Prognosis: Continue to be discussed at this time.  Chemotherapy remains palliative and less likely to be curative at this time.  7. Followup: Will be in 2 weeks for the next cycle of  maintenance chemotherapy.  25 minutes spent today face-to-face with the patient.  More than 50% of today's visit was spent on counseling, education and coordination of care including discussion about prognosis and future treatment plan.   Zola Button MD 06/30/17

## 2017-07-02 ENCOUNTER — Telehealth: Payer: Self-pay | Admitting: Oncology

## 2017-07-02 ENCOUNTER — Inpatient Hospital Stay: Payer: Medicare Other

## 2017-07-02 VITALS — BP 126/91 | HR 70 | Temp 97.9°F | Resp 17

## 2017-07-02 DIAGNOSIS — Z5112 Encounter for antineoplastic immunotherapy: Secondary | ICD-10-CM | POA: Diagnosis not present

## 2017-07-02 DIAGNOSIS — C786 Secondary malignant neoplasm of retroperitoneum and peritoneum: Secondary | ICD-10-CM | POA: Diagnosis not present

## 2017-07-02 DIAGNOSIS — C180 Malignant neoplasm of cecum: Secondary | ICD-10-CM | POA: Diagnosis not present

## 2017-07-02 DIAGNOSIS — C787 Secondary malignant neoplasm of liver and intrahepatic bile duct: Secondary | ICD-10-CM | POA: Diagnosis not present

## 2017-07-02 DIAGNOSIS — Z452 Encounter for adjustment and management of vascular access device: Secondary | ICD-10-CM | POA: Diagnosis not present

## 2017-07-02 DIAGNOSIS — Z95828 Presence of other vascular implants and grafts: Secondary | ICD-10-CM

## 2017-07-02 DIAGNOSIS — Z5111 Encounter for antineoplastic chemotherapy: Secondary | ICD-10-CM | POA: Diagnosis not present

## 2017-07-02 MED ORDER — HEPARIN SOD (PORK) LOCK FLUSH 100 UNIT/ML IV SOLN
500.0000 [IU] | Freq: Once | INTRAVENOUS | Status: AC | PRN
  Administered 2017-07-02: 500 [IU] via INTRAVENOUS
  Filled 2017-07-02: qty 5

## 2017-07-02 MED ORDER — SODIUM CHLORIDE 0.9 % IJ SOLN
10.0000 mL | INTRAMUSCULAR | Status: DC | PRN
  Administered 2017-07-02: 10 mL via INTRAVENOUS
  Filled 2017-07-02: qty 10

## 2017-07-02 NOTE — Patient Instructions (Signed)
Implanted Port Home Guide An implanted port is a type of central line that is placed under the skin. Central lines are used to provide IV access when treatment or nutrition needs to be given through a person's veins. Implanted ports are used for long-term IV access. An implanted port may be placed because:  You need IV medicine that would be irritating to the small veins in your hands or arms.  You need long-term IV medicines, such as antibiotics.  You need IV nutrition for a long period.  You need frequent blood draws for lab tests.  You need dialysis.  Implanted ports are usually placed in the chest area, but they can also be placed in the upper arm, the abdomen, or the leg. An implanted port has two main parts:  Reservoir. The reservoir is round and will appear as a small, raised area under your skin. The reservoir is the part where a needle is inserted to give medicines or draw blood.  Catheter. The catheter is a thin, flexible tube that extends from the reservoir. The catheter is placed into a large vein. Medicine that is inserted into the reservoir goes into the catheter and then into the vein.  How will I care for my incision site? Do not get the incision site wet. Bathe or shower as directed by your health care provider. How is my port accessed? Special steps must be taken to access the port:  Before the port is accessed, a numbing cream can be placed on the skin. This helps numb the skin over the port site.  Your health care provider uses a sterile technique to access the port. ? Your health care provider must put on a mask and sterile gloves. ? The skin over your port is cleaned carefully with an antiseptic and allowed to dry. ? The port is gently pinched between sterile gloves, and a needle is inserted into the port.  Only "non-coring" port needles should be used to access the port. Once the port is accessed, a blood return should be checked. This helps ensure that the port  is in the vein and is not clogged.  If your port needs to remain accessed for a constant infusion, a clear (transparent) bandage will be placed over the needle site. The bandage and needle will need to be changed every week, or as directed by your health care provider.  Keep the bandage covering the needle clean and dry. Do not get it wet. Follow your health care provider's instructions on how to take a shower or bath while the port is accessed.  If your port does not need to stay accessed, no bandage is needed over the port.  What is flushing? Flushing helps keep the port from getting clogged. Follow your health care provider's instructions on how and when to flush the port. Ports are usually flushed with saline solution or a medicine called heparin. The need for flushing will depend on how the port is used.  If the port is used for intermittent medicines or blood draws, the port will need to be flushed: ? After medicines have been given. ? After blood has been drawn. ? As part of routine maintenance.  If a constant infusion is running, the port may not need to be flushed.  How long will my port stay implanted? The port can stay in for as long as your health care provider thinks it is needed. When it is time for the port to come out, surgery will be   done to remove it. The procedure is similar to the one performed when the port was put in. When should I seek immediate medical care? When you have an implanted port, you should seek immediate medical care if:  You notice a bad smell coming from the incision site.  You have swelling, redness, or drainage at the incision site.  You have more swelling or pain at the port site or the surrounding area.  You have a fever that is not controlled with medicine.  This information is not intended to replace advice given to you by your health care provider. Make sure you discuss any questions you have with your health care provider. Document  Released: 06/08/2005 Document Revised: 11/14/2015 Document Reviewed: 02/13/2013 Elsevier Interactive Patient Education  2017 [employer]  

## 2017-07-02 NOTE — Telephone Encounter (Signed)
Spoke to patient regarding upcoming January appointment updates per 1/9 sch message °

## 2017-07-14 ENCOUNTER — Inpatient Hospital Stay (HOSPITAL_BASED_OUTPATIENT_CLINIC_OR_DEPARTMENT_OTHER): Payer: Medicare Other | Admitting: Oncology

## 2017-07-14 ENCOUNTER — Telehealth: Payer: Self-pay | Admitting: Oncology

## 2017-07-14 ENCOUNTER — Inpatient Hospital Stay: Payer: Medicare Other

## 2017-07-14 VITALS — BP 124/84 | HR 83 | Temp 98.5°F | Resp 18 | Ht 64.0 in | Wt 162.6 lb

## 2017-07-14 VITALS — BP 140/83 | HR 83

## 2017-07-14 DIAGNOSIS — Z79899 Other long term (current) drug therapy: Secondary | ICD-10-CM

## 2017-07-14 DIAGNOSIS — Z95828 Presence of other vascular implants and grafts: Secondary | ICD-10-CM

## 2017-07-14 DIAGNOSIS — C786 Secondary malignant neoplasm of retroperitoneum and peritoneum: Secondary | ICD-10-CM

## 2017-07-14 DIAGNOSIS — F329 Major depressive disorder, single episode, unspecified: Secondary | ICD-10-CM

## 2017-07-14 DIAGNOSIS — Z5111 Encounter for antineoplastic chemotherapy: Secondary | ICD-10-CM | POA: Diagnosis not present

## 2017-07-14 DIAGNOSIS — T451X5S Adverse effect of antineoplastic and immunosuppressive drugs, sequela: Secondary | ICD-10-CM | POA: Diagnosis not present

## 2017-07-14 DIAGNOSIS — C189 Malignant neoplasm of colon, unspecified: Secondary | ICD-10-CM

## 2017-07-14 DIAGNOSIS — C787 Secondary malignant neoplasm of liver and intrahepatic bile duct: Secondary | ICD-10-CM

## 2017-07-14 DIAGNOSIS — Z9221 Personal history of antineoplastic chemotherapy: Secondary | ICD-10-CM

## 2017-07-14 DIAGNOSIS — C180 Malignant neoplasm of cecum: Secondary | ICD-10-CM

## 2017-07-14 DIAGNOSIS — Z9049 Acquired absence of other specified parts of digestive tract: Secondary | ICD-10-CM

## 2017-07-14 DIAGNOSIS — M2560 Stiffness of unspecified joint, not elsewhere classified: Secondary | ICD-10-CM | POA: Diagnosis not present

## 2017-07-14 DIAGNOSIS — Z452 Encounter for adjustment and management of vascular access device: Secondary | ICD-10-CM | POA: Diagnosis not present

## 2017-07-14 DIAGNOSIS — K769 Liver disease, unspecified: Secondary | ICD-10-CM

## 2017-07-14 DIAGNOSIS — K6389 Other specified diseases of intestine: Secondary | ICD-10-CM

## 2017-07-14 DIAGNOSIS — Z5112 Encounter for antineoplastic immunotherapy: Secondary | ICD-10-CM | POA: Diagnosis not present

## 2017-07-14 DIAGNOSIS — G620 Drug-induced polyneuropathy: Secondary | ICD-10-CM | POA: Diagnosis not present

## 2017-07-14 LAB — CBC WITH DIFFERENTIAL/PLATELET
Basophils Absolute: 0 10*3/uL (ref 0.0–0.1)
Basophils Relative: 1 %
Eosinophils Absolute: 0.1 10*3/uL (ref 0.0–0.5)
Eosinophils Relative: 4 %
HEMATOCRIT: 38.5 % (ref 34.8–46.6)
HEMOGLOBIN: 12.7 g/dL (ref 11.6–15.9)
LYMPHS ABS: 1.4 10*3/uL (ref 0.9–3.3)
LYMPHS PCT: 40 %
MCH: 33 pg (ref 25.1–34.0)
MCHC: 33 g/dL (ref 31.5–36.0)
MCV: 100 fL (ref 79.5–101.0)
MONOS PCT: 10 %
Monocytes Absolute: 0.3 10*3/uL (ref 0.1–0.9)
NEUTROS ABS: 1.6 10*3/uL (ref 1.5–6.5)
NEUTROS PCT: 45 %
Platelets: 201 10*3/uL (ref 145–400)
RBC: 3.85 MIL/uL (ref 3.70–5.45)
RDW: 13.8 % (ref 11.2–16.1)
WBC: 3.5 10*3/uL — ABNORMAL LOW (ref 3.9–10.3)

## 2017-07-14 LAB — COMPREHENSIVE METABOLIC PANEL
ALBUMIN: 3.5 g/dL (ref 3.5–5.0)
ALK PHOS: 59 U/L (ref 40–150)
ALT: 12 U/L (ref 0–55)
ANION GAP: 7 (ref 3–11)
AST: 17 U/L (ref 5–34)
BILIRUBIN TOTAL: 0.6 mg/dL (ref 0.2–1.2)
BUN: 16 mg/dL (ref 7–26)
CO2: 24 mmol/L (ref 22–29)
Calcium: 9.3 mg/dL (ref 8.4–10.4)
Chloride: 108 mmol/L (ref 98–109)
Creatinine, Ser: 0.93 mg/dL (ref 0.60–1.10)
GFR calc Af Amer: >60 mL/min (ref >60)
GLUCOSE: 96 mg/dL (ref 70–140)
Potassium: 3.8 mmol/L (ref 3.3–4.7)
Sodium: 139 mmol/L (ref 136–145)
TOTAL PROTEIN: 7.1 g/dL (ref 6.4–8.3)

## 2017-07-14 MED ORDER — SODIUM CHLORIDE 0.9 % IV SOLN
Freq: Once | INTRAVENOUS | Status: AC
  Administered 2017-07-14: 11:00:00 via INTRAVENOUS

## 2017-07-14 MED ORDER — DEXAMETHASONE SODIUM PHOSPHATE 10 MG/ML IJ SOLN
10.0000 mg | Freq: Once | INTRAMUSCULAR | Status: AC
  Administered 2017-07-14: 10 mg via INTRAVENOUS

## 2017-07-14 MED ORDER — LORAZEPAM 1 MG PO TABS
ORAL_TABLET | ORAL | Status: AC
  Filled 2017-07-14: qty 1

## 2017-07-14 MED ORDER — SODIUM CHLORIDE 0.9 % IV SOLN
5.0000 mg/kg | Freq: Once | INTRAVENOUS | Status: AC
  Administered 2017-07-14: 400 mg via INTRAVENOUS
  Filled 2017-07-14: qty 16

## 2017-07-14 MED ORDER — PALONOSETRON HCL INJECTION 0.25 MG/5ML
0.2500 mg | Freq: Once | INTRAVENOUS | Status: AC
  Administered 2017-07-14: 0.25 mg via INTRAVENOUS

## 2017-07-14 MED ORDER — FLUOROURACIL CHEMO INJECTION 2.5 GM/50ML
400.0000 mg/m2 | Freq: Once | INTRAVENOUS | Status: AC
  Administered 2017-07-14: 750 mg via INTRAVENOUS
  Filled 2017-07-14: qty 15

## 2017-07-14 MED ORDER — SODIUM CHLORIDE 0.9 % IJ SOLN
10.0000 mL | INTRAMUSCULAR | Status: DC | PRN
  Administered 2017-07-14: 10 mL via INTRAVENOUS
  Filled 2017-07-14: qty 10

## 2017-07-14 MED ORDER — LORAZEPAM 1 MG PO TABS
1.0000 mg | ORAL_TABLET | Freq: Once | ORAL | Status: AC
  Administered 2017-07-14: 1 mg via ORAL

## 2017-07-14 MED ORDER — PALONOSETRON HCL INJECTION 0.25 MG/5ML
INTRAVENOUS | Status: AC
  Filled 2017-07-14: qty 5

## 2017-07-14 MED ORDER — DEXAMETHASONE SODIUM PHOSPHATE 10 MG/ML IJ SOLN
INTRAMUSCULAR | Status: AC
  Filled 2017-07-14: qty 1

## 2017-07-14 MED ORDER — LEUCOVORIN CALCIUM INJECTION 350 MG
400.0000 mg/m2 | Freq: Once | INTRAVENOUS | Status: AC
  Administered 2017-07-14: 752 mg via INTRAVENOUS
  Filled 2017-07-14: qty 37.6

## 2017-07-14 MED ORDER — SODIUM CHLORIDE 0.9 % IV SOLN
2400.0000 mg/m2 | INTRAVENOUS | Status: DC
  Administered 2017-07-14: 4500 mg via INTRAVENOUS
  Filled 2017-07-14: qty 90

## 2017-07-14 NOTE — Progress Notes (Signed)
Hematology and Oncology Follow Up Visit  Margaret Elliott [id number] 03-19-1951    07/14/17   Principle Diagnosis: 67 year old woman with colon cancer diagnosed in June 2014. She resented with stage IV disease isolated liver metastasis.   Prior Therapy:  She is status post laparoscopic laparotomy, evacuation of a pelvic abscess and right hemicolectomy with ileocolonic anastomosis done on December 09, 2012.   The pathology showed an invasive, well- differentiated colorectal adenocarcinoma. Tumor invades through the muscularis propria, with 2/18 lymph nodes involved, with the pathological staging T3 N1b disease. Her tumor was found to be microsatellite stable. KRAS wild type.   FOLFOX chemotherapy started 01/18/2013. Avastin to be added with cycle 2 on 02/01/2013. She is S/P 12 cycles completed in 05/2013.   She is a status post microwave ablation of metastatic lesion in the posterior segment of the right lobe of the liver completed on 09/28/2014 and repeated on 11/30/2014.  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  FOLFIRI and a Avastin salvage therapy started on 12/10/2015.  Current therapy:    She has received 5-FU, leucovorin and a Avastin as a maintenance since 2017.  Therapy is interrupted periodically for a treatment holiday.  She is currently here for the next cycle of maintenance 5-FU, leucovorin and Avastin.   Interim History:  Margaret Elliott is here for a follow-up by herself.  She reports stiffness on the right side of her body.  She reports in the morning time have some tenderness in her muscle of the arm as well as the leg.  She reports some pain with ambulation earlier on but improves as the day progresses.  She has no difficulties bearing weight and had not had any falls or syncope.  He denies any changes in her bowel or urine habits.  She denied any neurological deficits or weakness.  She received the  last cycle of chemotherapy without complications.  She denied any worsening nausea or vomiting.  Her peripheral neuropathy remained mild and unchanged.  She continues to attend to activities of daily living without any decline.  She denies any complications related to her Port-A-Cath.   She she reports no headaches, blurry vision, syncope or seizures. She does not report any fevers, chills or sweats.  Denies any weight loss, fevers or chills or sweats.  She does not report any chest pain, palpitation or orthopnea. She does not report any wheezing, hemoptysis or hematemesis. She does not report any frequency urgency or hesitancy. Does not report any easy bruising or petechiae.  She denies any anxiety attacks but does report depressed mood at times.  She does not report any bone fractures or dislocation.  She does not report any lymphadenopathy or pain associated with enlarged lymph glands.  She does not report any heat or cold intolerance.  Rest of her review of systems is negative.   Medications: I have reviewed the patient's current medications.  Current Outpatient Prescriptions  Medication Sig Dispense Refill  . acetaminophen (TYLENOL) 500 MG tablet Take 500 mg by mouth every 6 (six) hours as needed for pain.      . diphenhydrAMINE (BENADRYL) 25 mg capsule Take 25-50 mg by mouth daily as needed for itching. For allergic reaction      . lidocaine-prilocaine (EMLA) cream Apply 1 application topically as needed. Apply approx 1/2 tsp to skin over port, prior to chemotherapy treatments  1 kit  3  . Multiple Vitamin (MULTIVITAMIN) tablet  Take 1 tablet by mouth daily.      . ondansetron (ZOFRAN) 8 MG tablet Take 1 tablet (8 mg total) by mouth every 8 (eight) hours as needed for nausea.  30 tablet  1   No current facility-administered medications for this visit.     Allergies: No Known Allergies  Past Medical History, Surgical history, Social history, and Family History updated and remain  unchanged.    Physical Exam: Blood pressure 124/84, pulse 83, temperature 98.5 F (36.9 C), temperature source Oral, resp. rate 18, height _0  (1.626 m), weight 162 lb 9.6 oz (73.8 kg), SpO2 100 %.    ECOG: 0 General appearance: Well-appearing woman appeared comfortable. Oropharynx: Mucosa is pink and moist without thrush or ulcers. Head: Normocephalic atraumatic. Eyes: Sclera anicteric.  Pupils are equal and round and reactive. Lymph nodes: No lymphadenopathy noted in the cervical, axillary or inguinal areas. Heart: Regular rate and rhythm without murmurs. Lung: Clear without rhonchi or wheezes or dullness to percussion. Chest wall examination: Revealed Port-A-Cath site is clean and dry. Abdomen: Soft, nontender without rebound or guarding. Neurological: No new neurological deficits noted. Skin: Petechiae, ecchymosis. Skeletal examination: She has full range of motion in all joints.  No point tenderness in her shoulder, elbow, hip or knee.  CBC    Component Value Date/Time   WBC 3.5 (L) 07/14/2017 0912   RBC 3.85 07/14/2017 0912   HGB 12.7 07/14/2017 0912   HGB 12.3 05/26/2017 0809   HCT 38.5 07/14/2017 0912   HCT 37.6 05/26/2017 0809   PLT 201 07/14/2017 0912   PLT 180 05/26/2017 0809   MCV 100.0 07/14/2017 0912   MCV 100.3 05/26/2017 0809   MCH 33.0 07/14/2017 0912   MCHC 33.0 07/14/2017 0912   RDW 13.8 07/14/2017 0912   RDW 17.4 (H) 05/26/2017 0809   LYMPHSABS 1.4 07/14/2017 0912   LYMPHSABS 1.2 05/26/2017 0809   MONOABS 0.3 07/14/2017 0912   MONOABS 0.5 05/26/2017 0809   EOSABS 0.1 07/14/2017 0912   EOSABS 0.1 05/26/2017 0809   BASOSABS 0.0 07/14/2017 0912   BASOSABS 0.0 05/26/2017 0809   Results for Margaret Elliott, Margaret Elliott (MRN [id number]) as of 07/14/2017 09:55  Ref. Range 05/26/2017 08:09 06/30/2017 08:04  CEA ([facility]) Latest Ref Range: 0.00 - 5.00 ng/mL 3.79 3.27      Impression and Plan:  67 year old woman with the following issues:   1.  Stage  IV colon cancer: She presented with a large right cecal mass causing obstruction and abscess and appendicitis in 2014.  She is status post multiple therapies outlined previously.  She is currently achieving maintenance 5-FU, leucovorin and Avastin. CT scan obtained in December 2018 reviewed again and discussed with the patient.  Her tumor markers continue to show reasonable stability.  Options of therapy were reviewed today including continuing maintenance chemotherapy versus discontinuation of this approach.  Risks and benefits was discussed today as well as potential delayed complications related to chemotherapy.  After discussion today, she is agreeable to continue with the same approach.  She will have repeat imaging studies in 4-6 months.   2. Intravenous access. Port-A-Cath remains in place without complications.   3. 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  Chemotherapy has not exacerbated this condition.  4. Antiemetics: She has Compazine prescribed previously.  No changes since last visit without any exacerbation of her nausea or vomiting.  5. Discoloration of her palms: Chemotherapy related changes.  No intervention or changes needed at this time.  6.  Prognosis: Above therapy remains palliative.  Her performance status is excellent and aggressive therapy is warranted.  7.  Muscle stiffness: Appears to be related to lack of mobility and exercise.  We have discussed strategies to improve her flexibility including stretching and exercise.  Imaging studies will be needed if she has persistent pain.  8. Followup: Will be in 2 weeks for the next cycle of maintenance chemotherapy.  25 minutes spent today face-to-face with the patient.  More than 50% of today's visit was spent on counseling, education and answering her questions including coordination of care.  Zola Button MD 07/14/17

## 2017-07-14 NOTE — Patient Instructions (Addendum)
[facility] Discharge Instructions for Patients Receiving Chemotherapy  Today you received the following chemotherapy agents :  Avastin, Leucovorin, Fluorouracil.  To help prevent nausea and vomiting after your treatment, we encourage you to take your nausea medication as prescribed.   If you develop nausea and vomiting that is not controlled by your nausea medication, call the clinic.   BELOW ARE SYMPTOMS THAT SHOULD BE REPORTED IMMEDIATELY:  *FEVER GREATER THAN 100.5 F  *CHILLS WITH OR WITHOUT FEVER  NAUSEA AND VOMITING THAT IS NOT CONTROLLED WITH YOUR NAUSEA MEDICATION  *UNUSUAL SHORTNESS OF BREATH  *UNUSUAL BRUISING OR BLEEDING  TENDERNESS IN MOUTH AND THROAT WITH OR WITHOUT PRESENCE OF ULCERS  *URINARY PROBLEMS  *BOWEL PROBLEMS  UNUSUAL RASH Items with * indicate a potential emergency and should be followed up as soon as possible.  Feel free to call the clinic should you have any questions or concerns. The clinic phone number is [phone].  Please show the CHEMO ALERT CARD at check-in to the Emergency Department and triage nurse.   

## 2017-07-14 NOTE — Patient Instructions (Signed)
Implanted Port Home Guide An implanted port is a type of central line that is placed under the skin. Central lines are used to provide IV access when treatment or nutrition needs to be given through a person's veins. Implanted ports are used for long-term IV access. An implanted port may be placed because:  You need IV medicine that would be irritating to the small veins in your hands or arms.  You need long-term IV medicines, such as antibiotics.  You need IV nutrition for a long period.  You need frequent blood draws for lab tests.  You need dialysis.  Implanted ports are usually placed in the chest area, but they can also be placed in the upper arm, the abdomen, or the leg. An implanted port has two main parts:  Reservoir. The reservoir is round and will appear as a small, raised area under your skin. The reservoir is the part where a needle is inserted to give medicines or draw blood.  Catheter. The catheter is a thin, flexible tube that extends from the reservoir. The catheter is placed into a large vein. Medicine that is inserted into the reservoir goes into the catheter and then into the vein.  How will I care for my incision site? Do not get the incision site wet. Bathe or shower as directed by your health care provider. How is my port accessed? Special steps must be taken to access the port:  Before the port is accessed, a numbing cream can be placed on the skin. This helps numb the skin over the port site.  Your health care provider uses a sterile technique to access the port. ? Your health care provider must put on a mask and sterile gloves. ? The skin over your port is cleaned carefully with an antiseptic and allowed to dry. ? The port is gently pinched between sterile gloves, and a needle is inserted into the port.  Only "non-coring" port needles should be used to access the port. Once the port is accessed, a blood return should be checked. This helps ensure that the port  is in the vein and is not clogged.  If your port needs to remain accessed for a constant infusion, a clear (transparent) bandage will be placed over the needle site. The bandage and needle will need to be changed every week, or as directed by your health care provider.  Keep the bandage covering the needle clean and dry. Do not get it wet. Follow your health care provider's instructions on how to take a shower or bath while the port is accessed.  If your port does not need to stay accessed, no bandage is needed over the port.  What is flushing? Flushing helps keep the port from getting clogged. Follow your health care provider's instructions on how and when to flush the port. Ports are usually flushed with saline solution or a medicine called heparin. The need for flushing will depend on how the port is used.  If the port is used for intermittent medicines or blood draws, the port will need to be flushed: ? After medicines have been given. ? After blood has been drawn. ? As part of routine maintenance.  If a constant infusion is running, the port may not need to be flushed.  How long will my port stay implanted? The port can stay in for as long as your health care provider thinks it is needed. When it is time for the port to come out, surgery will be   done to remove it. The procedure is similar to the one performed when the port was put in. When should I seek immediate medical care? When you have an implanted port, you should seek immediate medical care if:  You notice a bad smell coming from the incision site.  You have swelling, redness, or drainage at the incision site.  You have more swelling or pain at the port site or the surrounding area.  You have a fever that is not controlled with medicine.  This information is not intended to replace advice given to you by your health care provider. Make sure you discuss any questions you have with your health care provider. Document  Released: 06/08/2005 Document Revised: 11/14/2015 Document Reviewed: 02/13/2013 Elsevier Interactive Patient Education  2017 [employer]  

## 2017-07-14 NOTE — Telephone Encounter (Signed)
Gave patient avs report and appointments for January and February  °

## 2017-07-15 LAB — CEA: CEA: 16.3 ng/mL — ABNORMAL HIGH (ref 0.0–4.7)

## 2017-07-16 ENCOUNTER — Inpatient Hospital Stay: Payer: Medicare Other

## 2017-07-16 VITALS — BP 126/67 | HR 80 | Temp 97.9°F | Resp 18

## 2017-07-16 DIAGNOSIS — C180 Malignant neoplasm of cecum: Secondary | ICD-10-CM | POA: Diagnosis not present

## 2017-07-16 DIAGNOSIS — K6389 Other specified diseases of intestine: Secondary | ICD-10-CM

## 2017-07-16 DIAGNOSIS — C189 Malignant neoplasm of colon, unspecified: Secondary | ICD-10-CM

## 2017-07-16 DIAGNOSIS — Z452 Encounter for adjustment and management of vascular access device: Secondary | ICD-10-CM | POA: Diagnosis not present

## 2017-07-16 DIAGNOSIS — Z5111 Encounter for antineoplastic chemotherapy: Secondary | ICD-10-CM | POA: Diagnosis not present

## 2017-07-16 DIAGNOSIS — C787 Secondary malignant neoplasm of liver and intrahepatic bile duct: Secondary | ICD-10-CM | POA: Diagnosis not present

## 2017-07-16 DIAGNOSIS — C786 Secondary malignant neoplasm of retroperitoneum and peritoneum: Secondary | ICD-10-CM | POA: Diagnosis not present

## 2017-07-16 DIAGNOSIS — Z5112 Encounter for antineoplastic immunotherapy: Secondary | ICD-10-CM | POA: Diagnosis not present

## 2017-07-16 DIAGNOSIS — K769 Liver disease, unspecified: Secondary | ICD-10-CM

## 2017-07-16 MED ORDER — SODIUM CHLORIDE 0.9% FLUSH
10.0000 mL | INTRAVENOUS | Status: DC | PRN
  Administered 2017-07-16: 10 mL
  Filled 2017-07-16: qty 10

## 2017-07-16 MED ORDER — HEPARIN SOD (PORK) LOCK FLUSH 100 UNIT/ML IV SOLN
500.0000 [IU] | Freq: Once | INTRAVENOUS | Status: AC | PRN
  Administered 2017-07-16: 500 [IU]
  Filled 2017-07-16: qty 5

## 2017-07-28 ENCOUNTER — Inpatient Hospital Stay: Payer: Medicare Other | Attending: Oncology | Admitting: Oncology

## 2017-07-28 ENCOUNTER — Inpatient Hospital Stay: Payer: Medicare Other

## 2017-07-28 ENCOUNTER — Other Ambulatory Visit: Payer: Self-pay

## 2017-07-28 VITALS — BP 127/75

## 2017-07-28 VITALS — BP 128/84 | HR 88 | Temp 97.7°F | Resp 18 | Ht 64.0 in | Wt 160.1 lb

## 2017-07-28 DIAGNOSIS — Z452 Encounter for adjustment and management of vascular access device: Secondary | ICD-10-CM | POA: Diagnosis not present

## 2017-07-28 DIAGNOSIS — C787 Secondary malignant neoplasm of liver and intrahepatic bile duct: Secondary | ICD-10-CM

## 2017-07-28 DIAGNOSIS — C180 Malignant neoplasm of cecum: Secondary | ICD-10-CM | POA: Insufficient documentation

## 2017-07-28 DIAGNOSIS — L989 Disorder of the skin and subcutaneous tissue, unspecified: Secondary | ICD-10-CM | POA: Insufficient documentation

## 2017-07-28 DIAGNOSIS — C189 Malignant neoplasm of colon, unspecified: Secondary | ICD-10-CM

## 2017-07-28 DIAGNOSIS — Z79899 Other long term (current) drug therapy: Secondary | ICD-10-CM | POA: Diagnosis not present

## 2017-07-28 DIAGNOSIS — C786 Secondary malignant neoplasm of retroperitoneum and peritoneum: Secondary | ICD-10-CM | POA: Insufficient documentation

## 2017-07-28 DIAGNOSIS — R5383 Other fatigue: Secondary | ICD-10-CM | POA: Insufficient documentation

## 2017-07-28 DIAGNOSIS — K769 Liver disease, unspecified: Secondary | ICD-10-CM

## 2017-07-28 DIAGNOSIS — G620 Drug-induced polyneuropathy: Secondary | ICD-10-CM | POA: Diagnosis not present

## 2017-07-28 DIAGNOSIS — Z5111 Encounter for antineoplastic chemotherapy: Secondary | ICD-10-CM | POA: Diagnosis not present

## 2017-07-28 DIAGNOSIS — T451X5S Adverse effect of antineoplastic and immunosuppressive drugs, sequela: Secondary | ICD-10-CM | POA: Diagnosis not present

## 2017-07-28 DIAGNOSIS — Z5112 Encounter for antineoplastic immunotherapy: Secondary | ICD-10-CM | POA: Insufficient documentation

## 2017-07-28 DIAGNOSIS — Z95828 Presence of other vascular implants and grafts: Secondary | ICD-10-CM

## 2017-07-28 DIAGNOSIS — K6389 Other specified diseases of intestine: Secondary | ICD-10-CM

## 2017-07-28 LAB — CEA (IN HOUSE-CHCC): CEA (CHCC-IN HOUSE): 3.05 ng/mL (ref 0.00–5.00)

## 2017-07-28 LAB — UA PROTEIN, DIPSTICK - CHCC: PROTEIN: NEGATIVE mg/dL

## 2017-07-28 LAB — CBC WITH DIFFERENTIAL/PLATELET
Basophils Absolute: 0 10*3/uL (ref 0.0–0.1)
Basophils Relative: 1 %
Eosinophils Absolute: 0.1 10*3/uL (ref 0.0–0.5)
Eosinophils Relative: 2 %
HEMATOCRIT: 38.3 % (ref 34.8–46.6)
HEMOGLOBIN: 12.7 g/dL (ref 11.6–15.9)
LYMPHS ABS: 1.2 10*3/uL (ref 0.9–3.3)
LYMPHS PCT: 36 %
MCH: 33.3 pg (ref 25.1–34.0)
MCHC: 33.3 g/dL (ref 31.5–36.0)
MCV: 100.2 fL (ref 79.5–101.0)
MONOS PCT: 11 %
Monocytes Absolute: 0.3 10*3/uL (ref 0.1–0.9)
NEUTROS ABS: 1.6 10*3/uL (ref 1.5–6.5)
NEUTROS PCT: 50 %
Platelets: 203 10*3/uL (ref 145–400)
RBC: 3.82 MIL/uL (ref 3.70–5.45)
RDW: 14.9 % — ABNORMAL HIGH (ref 11.2–14.5)
WBC: 3.2 10*3/uL — AB (ref 3.9–10.3)

## 2017-07-28 LAB — COMPREHENSIVE METABOLIC PANEL
ALT: 10 U/L (ref 0–55)
AST: 17 U/L (ref 5–34)
Albumin: 3.5 g/dL (ref 3.5–5.0)
Alkaline Phosphatase: 58 U/L (ref 40–150)
Anion gap: 7 (ref 3–11)
BUN: 19 mg/dL (ref 7–26)
CHLORIDE: 108 mmol/L (ref 98–109)
CO2: 24 mmol/L (ref 22–29)
Calcium: 9.3 mg/dL (ref 8.4–10.4)
Creatinine, Ser: 0.93 mg/dL (ref 0.60–1.10)
Glucose, Bld: 118 mg/dL (ref 70–140)
POTASSIUM: 3.7 mmol/L (ref 3.5–5.1)
SODIUM: 139 mmol/L (ref 136–145)
Total Bilirubin: 0.6 mg/dL (ref 0.2–1.2)
Total Protein: 7.2 g/dL (ref 6.4–8.3)

## 2017-07-28 MED ORDER — LORAZEPAM 1 MG PO TABS
1.0000 mg | ORAL_TABLET | Freq: Once | ORAL | Status: AC
  Administered 2017-07-28: 1 mg via ORAL

## 2017-07-28 MED ORDER — PALONOSETRON HCL INJECTION 0.25 MG/5ML
0.2500 mg | Freq: Once | INTRAVENOUS | Status: AC
  Administered 2017-07-28: 0.25 mg via INTRAVENOUS

## 2017-07-28 MED ORDER — SODIUM CHLORIDE 0.9 % IJ SOLN
10.0000 mL | INTRAMUSCULAR | Status: DC | PRN
  Administered 2017-07-28: 10 mL via INTRAVENOUS
  Filled 2017-07-28: qty 10

## 2017-07-28 MED ORDER — DEXAMETHASONE SODIUM PHOSPHATE 10 MG/ML IJ SOLN
INTRAMUSCULAR | Status: AC
  Filled 2017-07-28: qty 1

## 2017-07-28 MED ORDER — LEUCOVORIN CALCIUM INJECTION 350 MG
400.0000 mg/m2 | Freq: Once | INTRAVENOUS | Status: AC
  Administered 2017-07-28: 752 mg via INTRAVENOUS
  Filled 2017-07-28: qty 37.6

## 2017-07-28 MED ORDER — FLUOROURACIL CHEMO INJECTION 2.5 GM/50ML
400.0000 mg/m2 | Freq: Once | INTRAVENOUS | Status: AC
  Administered 2017-07-28: 750 mg via INTRAVENOUS
  Filled 2017-07-28: qty 15

## 2017-07-28 MED ORDER — HEPARIN SOD (PORK) LOCK FLUSH 100 UNIT/ML IV SOLN
500.0000 [IU] | Freq: Once | INTRAVENOUS | Status: DC | PRN
  Filled 2017-07-28: qty 5

## 2017-07-28 MED ORDER — LORAZEPAM 1 MG PO TABS
ORAL_TABLET | ORAL | Status: AC
  Filled 2017-07-28: qty 1

## 2017-07-28 MED ORDER — SODIUM CHLORIDE 0.9 % IV SOLN
5.0000 mg/kg | Freq: Once | INTRAVENOUS | Status: AC
  Administered 2017-07-28: 400 mg via INTRAVENOUS
  Filled 2017-07-28: qty 16

## 2017-07-28 MED ORDER — SODIUM CHLORIDE 0.9% FLUSH
10.0000 mL | INTRAVENOUS | Status: DC | PRN
  Administered 2017-07-28: 10 mL
  Filled 2017-07-28: qty 10

## 2017-07-28 MED ORDER — PALONOSETRON HCL INJECTION 0.25 MG/5ML
INTRAVENOUS | Status: AC
  Filled 2017-07-28: qty 5

## 2017-07-28 MED ORDER — SODIUM CHLORIDE 0.9 % IV SOLN
2400.0000 mg/m2 | INTRAVENOUS | Status: DC
  Administered 2017-07-28: 4500 mg via INTRAVENOUS
  Filled 2017-07-28: qty 90

## 2017-07-28 MED ORDER — DEXAMETHASONE SODIUM PHOSPHATE 10 MG/ML IJ SOLN
10.0000 mg | Freq: Once | INTRAMUSCULAR | Status: AC
  Administered 2017-07-28: 10 mg via INTRAVENOUS

## 2017-07-28 MED ORDER — SODIUM CHLORIDE 0.9 % IV SOLN
Freq: Once | INTRAVENOUS | Status: AC
  Administered 2017-07-28: 09:00:00 via INTRAVENOUS

## 2017-07-28 NOTE — Progress Notes (Signed)
Hematology and Oncology Follow Up Visit  TULSI CROSSETT [id number] 1951-01-26    07/28/17   Principle Diagnosis: 67 year old woman with stage IV colon cancer diagnosed in June 2014.  She has documented liver disease as well as peritoneal involvement.    Prior Therapy:  She is status post laparoscopic laparotomy right hemicolectomy with ileocolonic anastomosis done on December 09, 2012.   The pathology showed an invasive, well- differentiated colorectal adenocarcinoma. Tumor invades through the muscularis propria, with 2/18 lymph nodes involved, with the pathological staging T3 N1b disease. Her tumor was found to be microsatellite stable. KRAS wild type.   FOLFOX and a Avastin chemotherapy started 01/18/2013. She is S/P 12 cycles completed in 05/2013.   She is a status post microwave ablation of metastatic lesion in the posterior segment of the right lobe of the liver completed on 09/28/2014 and repeated on 11/30/2014.  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  FOLFIRI and a Avastin salvage therapy started on 12/10/2015.  Current therapy:     She is currently here for the next cycle of maintenance 5-FU, leucovorin and Avastin.   Interim History:  Ms. Amerman is presents today for a follow-up visit by herself.  Since the last visit, she continues to feel reasonably well without any major complaints.  She does report grade 1 fatigue but still able to perform most activities of daily living.  She does try to exercise periodically but no persistent or constant steady schedule.  Appetite has been excellent and have maintained weight.  Peripheral neuropathy has not worsened since the last visit.   She received the last cycle of chemotherapy without nausea or vomiting.  She denies any infusion related toxicities but does report more skin drying and discoloration at times.  Her Port-A-Cath remains in use without  particular complications.   She reports no headaches, blurry vision, syncope or seizures.  She does not report any worsening neuropathy.  She does not report any fevers, chills or sweats.  Weight is stable. She does not report any chest pain, palpitation or orthopnea. She does not report any wheezing, hemoptysis or hematemesis. She does not report any frequency urgency or hesitancy. Does not report any easy bruising or petechiae.  She denies any anxiety or depression with appropriate mood.  She does not report any arthralgias or myalgias.  She does not report any lymphadenopathy or petechiae.  She does not report any heat or cold intolerance.  Rest of her review of systems is negative.   Medications: I have reviewed the patient's current medications.  Current Outpatient Prescriptions  Medication Sig Dispense Refill  . acetaminophen (TYLENOL) 500 MG tablet Take 500 mg by mouth every 6 (six) hours as needed for pain.      . diphenhydrAMINE (BENADRYL) 25 mg capsule Take 25-50 mg by mouth daily as needed for itching. For allergic reaction      . lidocaine-prilocaine (EMLA) cream Apply 1 application topically as needed. Apply approx 1/2 tsp to skin over port, prior to chemotherapy treatments  1 kit  3  . Multiple Vitamin (MULTIVITAMIN) tablet Take 1 tablet by mouth daily.      . ondansetron (ZOFRAN) 8 MG tablet Take 1 tablet (8 mg total) by mouth every 8 (eight) hours as needed for nausea.  30 tablet  1   No current facility-administered medications for this visit.     Allergies: No Known Allergies  Past Medical History, Surgical  history, Social history, and Family History updated and remain unchanged.    Physical Exam: Blood pressure 128/84, pulse 88, temperature 97.7 F (36.5 C), temperature source Oral, resp. rate 18, height '5\' 4"'$  (1.626 m), weight 160 lb 1.6 oz (72.6 kg), SpO2 100 %.    ECOG: 0 General appearance: Alert, awake woman appeared comfortable. Oropharynx: No thrush or  ulcers noted.  Mucous membranes.  Normal. Head: Normocephalic atraumatic. Eyes: Pupils are equal and round and reactive to light.  Sclerae not injected. Lymph nodes: No lymphadenopathy noted in the cervical, axillary or inguinal areas. Heart: No murmurs or gallops.  S1 and S2.  Regular rate and rhythm. Lung: Clear in all lung fields without rhonchi or wheezes.  Abdomen: Soft, nontender without rebound or guarding.  Good bowel sounds auscultated in all 4 quadrants. Neurological: No new neurological deficits noted.  Motor, sensory or deep tendon reflexes. Skin: Petechiae, ecchymosis. Skeletal examination: She has full range of motion in all joints.  No joint deformity or effusion.  CBC    Component Value Date/Time   WBC 3.2 (L) 07/28/2017 0751   RBC 3.82 07/28/2017 0751   HGB 12.7 07/28/2017 0751   HGB 12.3 05/26/2017 0809   HCT 38.3 07/28/2017 0751   HCT 37.6 05/26/2017 0809   PLT 203 07/28/2017 0751   PLT 180 05/26/2017 0809   MCV 100.2 07/28/2017 0751   MCV 100.3 05/26/2017 0809   MCH 33.3 07/28/2017 0751   MCHC 33.3 07/28/2017 0751   RDW 14.9 (H) 07/28/2017 0751   RDW 17.4 (H) 05/26/2017 0809   LYMPHSABS 1.2 07/28/2017 0751   LYMPHSABS 1.2 05/26/2017 0809   MONOABS 0.3 07/28/2017 0751   MONOABS 0.5 05/26/2017 0809   EOSABS 0.1 07/28/2017 0751   EOSABS 0.1 05/26/2017 0809   BASOSABS 0.0 07/28/2017 0751   BASOSABS 0.0 05/26/2017 0809       Impression and Plan:  67 year old woman with the following issues:   1.  Stage IV colon cancer with documented disease to the liver and the peritoneum: Her previous treatments are outlined above and currently receiving maintenance 5-FU, leucovorin and a Avastin.  Imaging studies obtained in December 2018 was reviewed again and discussed with the patient continues to have stable disease.  Her CEA did show elevation in July 14, 2017 and will be monitored periodically.   The natural course of her disease was reviewed again  including treatment options.  Continuing maintenance chemotherapy versus switching to a different salvage regimen was debated.  For the time being she continues to tolerate this chemotherapy reasonably well without any delayed toxicities.  She is agreeable to continue for the time being with plan to repeat imaging studies in 3 months.   2. Intravenous access. Port-A-Cath has been used without complications.  She denied any pain or discomfort related to it.  3. 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  He is currently at grade 1 which is unchanged and will continue to monitor.  4. Antiemetics: No nausea or vomiting has been detected.  Compazine has been successful at times in treating her nausea if it develops.   5. Discoloration of her palms: Unchanged from previous examination and related to 5-FU.    6.  Prognosis: The natural course of this disease has been discussed previously and today and any treatment is palliative.  Her performance status remain excellent and aggressive therapy is warranted.  7. Followup: Will be in 2 weeks for the next cycle of maintenance chemotherapy.  25 minutes spent today  face-to-face with the patient.  More than 50% of today's visit was spent on counseling, education and coordination of her multifaceted care.  Zola Button MD 07/28/17

## 2017-07-30 ENCOUNTER — Inpatient Hospital Stay: Payer: Medicare Other

## 2017-07-30 VITALS — BP 126/72 | HR 82 | Temp 98.2°F | Resp 18

## 2017-07-30 DIAGNOSIS — K6389 Other specified diseases of intestine: Secondary | ICD-10-CM

## 2017-07-30 DIAGNOSIS — Z452 Encounter for adjustment and management of vascular access device: Secondary | ICD-10-CM | POA: Diagnosis not present

## 2017-07-30 DIAGNOSIS — K769 Liver disease, unspecified: Secondary | ICD-10-CM

## 2017-07-30 DIAGNOSIS — C189 Malignant neoplasm of colon, unspecified: Secondary | ICD-10-CM

## 2017-07-30 DIAGNOSIS — Z5111 Encounter for antineoplastic chemotherapy: Secondary | ICD-10-CM | POA: Diagnosis not present

## 2017-07-30 DIAGNOSIS — Z5112 Encounter for antineoplastic immunotherapy: Secondary | ICD-10-CM | POA: Diagnosis not present

## 2017-07-30 DIAGNOSIS — C787 Secondary malignant neoplasm of liver and intrahepatic bile duct: Secondary | ICD-10-CM | POA: Diagnosis not present

## 2017-07-30 DIAGNOSIS — C786 Secondary malignant neoplasm of retroperitoneum and peritoneum: Secondary | ICD-10-CM | POA: Diagnosis not present

## 2017-07-30 DIAGNOSIS — C180 Malignant neoplasm of cecum: Secondary | ICD-10-CM | POA: Diagnosis not present

## 2017-07-30 MED ORDER — HEPARIN SOD (PORK) LOCK FLUSH 100 UNIT/ML IV SOLN
500.0000 [IU] | Freq: Once | INTRAVENOUS | Status: AC | PRN
  Administered 2017-07-30: 500 [IU]
  Filled 2017-07-30: qty 5

## 2017-07-30 MED ORDER — SODIUM CHLORIDE 0.9% FLUSH
10.0000 mL | INTRAVENOUS | Status: DC | PRN
  Administered 2017-07-30: 10 mL
  Filled 2017-07-30: qty 10

## 2017-08-11 ENCOUNTER — Inpatient Hospital Stay: Payer: Medicare Other

## 2017-08-11 ENCOUNTER — Ambulatory Visit: Payer: Medicare Other

## 2017-08-11 ENCOUNTER — Telehealth: Payer: Self-pay | Admitting: Oncology

## 2017-08-11 ENCOUNTER — Inpatient Hospital Stay (HOSPITAL_BASED_OUTPATIENT_CLINIC_OR_DEPARTMENT_OTHER): Payer: Medicare Other | Admitting: Oncology

## 2017-08-11 VITALS — BP 122/74 | HR 83

## 2017-08-11 VITALS — BP 123/85 | HR 87 | Temp 98.7°F | Resp 18 | Ht 64.0 in | Wt 159.2 lb

## 2017-08-11 DIAGNOSIS — C189 Malignant neoplasm of colon, unspecified: Secondary | ICD-10-CM

## 2017-08-11 DIAGNOSIS — G620 Drug-induced polyneuropathy: Secondary | ICD-10-CM | POA: Diagnosis not present

## 2017-08-11 DIAGNOSIS — Z79899 Other long term (current) drug therapy: Secondary | ICD-10-CM

## 2017-08-11 DIAGNOSIS — C787 Secondary malignant neoplasm of liver and intrahepatic bile duct: Secondary | ICD-10-CM

## 2017-08-11 DIAGNOSIS — K769 Liver disease, unspecified: Secondary | ICD-10-CM

## 2017-08-11 DIAGNOSIS — K6389 Other specified diseases of intestine: Secondary | ICD-10-CM

## 2017-08-11 DIAGNOSIS — C786 Secondary malignant neoplasm of retroperitoneum and peritoneum: Secondary | ICD-10-CM | POA: Diagnosis not present

## 2017-08-11 DIAGNOSIS — Z5112 Encounter for antineoplastic immunotherapy: Secondary | ICD-10-CM | POA: Diagnosis not present

## 2017-08-11 DIAGNOSIS — T451X5S Adverse effect of antineoplastic and immunosuppressive drugs, sequela: Secondary | ICD-10-CM

## 2017-08-11 DIAGNOSIS — Z452 Encounter for adjustment and management of vascular access device: Secondary | ICD-10-CM | POA: Diagnosis not present

## 2017-08-11 DIAGNOSIS — C180 Malignant neoplasm of cecum: Secondary | ICD-10-CM | POA: Diagnosis not present

## 2017-08-11 DIAGNOSIS — Z95828 Presence of other vascular implants and grafts: Secondary | ICD-10-CM

## 2017-08-11 DIAGNOSIS — Z5111 Encounter for antineoplastic chemotherapy: Secondary | ICD-10-CM | POA: Diagnosis not present

## 2017-08-11 LAB — CBC WITH DIFFERENTIAL/PLATELET
Basophils Absolute: 0 10*3/uL (ref 0.0–0.1)
Basophils Relative: 0 %
EOS ABS: 0.1 10*3/uL (ref 0.0–0.5)
Eosinophils Relative: 1 %
HCT: 39.3 % (ref 34.8–46.6)
Hemoglobin: 12.9 g/dL (ref 11.6–15.9)
LYMPHS ABS: 1.1 10*3/uL (ref 0.9–3.3)
LYMPHS PCT: 27 %
MCH: 32.7 pg (ref 25.1–34.0)
MCHC: 32.8 g/dL (ref 31.5–36.0)
MCV: 99.7 fL (ref 79.5–101.0)
MONO ABS: 0.4 10*3/uL (ref 0.1–0.9)
MONOS PCT: 10 %
Neutro Abs: 2.6 10*3/uL (ref 1.5–6.5)
Neutrophils Relative %: 62 %
Platelets: 199 10*3/uL (ref 145–400)
RBC: 3.94 MIL/uL (ref 3.70–5.45)
RDW: 14.3 % (ref 11.2–14.5)
WBC: 4.3 10*3/uL (ref 3.9–10.3)

## 2017-08-11 LAB — COMPREHENSIVE METABOLIC PANEL
ALT: 11 U/L (ref 0–55)
ANION GAP: 8 (ref 3–11)
AST: 17 U/L (ref 5–34)
Albumin: 3.6 g/dL (ref 3.5–5.0)
Alkaline Phosphatase: 70 U/L (ref 40–150)
BILIRUBIN TOTAL: 0.5 mg/dL (ref 0.2–1.2)
BUN: 18 mg/dL (ref 7–26)
CO2: 23 mmol/L (ref 22–29)
Calcium: 9.7 mg/dL (ref 8.4–10.4)
Chloride: 109 mmol/L (ref 98–109)
Creatinine, Ser: 0.99 mg/dL (ref 0.60–1.10)
GFR calc Af Amer: >60 mL/min (ref >60)
GFR, EST NON AFRICAN AMERICAN: 58 mL/min — AB (ref >60)
Glucose, Bld: 95 mg/dL (ref 70–140)
POTASSIUM: 4.2 mmol/L (ref 3.5–5.1)
Sodium: 140 mmol/L (ref 136–145)
Total Protein: 7.5 g/dL (ref 6.4–8.3)

## 2017-08-11 LAB — CEA (IN HOUSE-CHCC): CEA (CHCC-IN HOUSE): 3.44 ng/mL (ref 0.00–5.00)

## 2017-08-11 MED ORDER — SODIUM CHLORIDE 0.9 % IV SOLN
2400.0000 mg/m2 | INTRAVENOUS | Status: DC
  Administered 2017-08-11: 4500 mg via INTRAVENOUS
  Filled 2017-08-11: qty 90

## 2017-08-11 MED ORDER — SODIUM CHLORIDE 0.9 % IJ SOLN
10.0000 mL | INTRAMUSCULAR | Status: DC | PRN
  Administered 2017-08-11: 10 mL via INTRAVENOUS
  Filled 2017-08-11: qty 10

## 2017-08-11 MED ORDER — DEXAMETHASONE SODIUM PHOSPHATE 10 MG/ML IJ SOLN
10.0000 mg | Freq: Once | INTRAMUSCULAR | Status: AC
  Administered 2017-08-11: 10 mg via INTRAVENOUS

## 2017-08-11 MED ORDER — SODIUM CHLORIDE 0.9 % IV SOLN
Freq: Once | INTRAVENOUS | Status: AC
  Administered 2017-08-11: 10:00:00 via INTRAVENOUS

## 2017-08-11 MED ORDER — LORAZEPAM 1 MG PO TABS
1.0000 mg | ORAL_TABLET | Freq: Once | ORAL | Status: AC
  Administered 2017-08-11: 1 mg via ORAL

## 2017-08-11 MED ORDER — LEUCOVORIN CALCIUM INJECTION 350 MG
400.0000 mg/m2 | Freq: Once | INTRAVENOUS | Status: AC
  Administered 2017-08-11: 752 mg via INTRAVENOUS
  Filled 2017-08-11: qty 37.6

## 2017-08-11 MED ORDER — PALONOSETRON HCL INJECTION 0.25 MG/5ML
INTRAVENOUS | Status: AC
  Filled 2017-08-11: qty 5

## 2017-08-11 MED ORDER — PALONOSETRON HCL INJECTION 0.25 MG/5ML
0.2500 mg | Freq: Once | INTRAVENOUS | Status: AC
  Administered 2017-08-11: 0.25 mg via INTRAVENOUS

## 2017-08-11 MED ORDER — LORAZEPAM 1 MG PO TABS
ORAL_TABLET | ORAL | Status: AC
  Filled 2017-08-11: qty 1

## 2017-08-11 MED ORDER — FLUOROURACIL CHEMO INJECTION 2.5 GM/50ML
400.0000 mg/m2 | Freq: Once | INTRAVENOUS | Status: AC
  Administered 2017-08-11: 750 mg via INTRAVENOUS
  Filled 2017-08-11: qty 15

## 2017-08-11 MED ORDER — DEXAMETHASONE SODIUM PHOSPHATE 10 MG/ML IJ SOLN
INTRAMUSCULAR | Status: AC
  Filled 2017-08-11: qty 1

## 2017-08-11 MED ORDER — SODIUM CHLORIDE 0.9 % IV SOLN
5.0000 mg/kg | Freq: Once | INTRAVENOUS | Status: AC
  Administered 2017-08-11: 400 mg via INTRAVENOUS
  Filled 2017-08-11: qty 16

## 2017-08-11 NOTE — Patient Instructions (Signed)
[facility] Discharge Instructions for Patients Receiving Chemotherapy  Today you received the following chemotherapy agents :  Avastin, Leucovorin, Fluorouracil.  To help prevent nausea and vomiting after your treatment, we encourage you to take your nausea medication as prescribed.   If you develop nausea and vomiting that is not controlled by your nausea medication, call the clinic.   BELOW ARE SYMPTOMS THAT SHOULD BE REPORTED IMMEDIATELY:  *FEVER GREATER THAN 100.5 F  *CHILLS WITH OR WITHOUT FEVER  NAUSEA AND VOMITING THAT IS NOT CONTROLLED WITH YOUR NAUSEA MEDICATION  *UNUSUAL SHORTNESS OF BREATH  *UNUSUAL BRUISING OR BLEEDING  TENDERNESS IN MOUTH AND THROAT WITH OR WITHOUT PRESENCE OF ULCERS  *URINARY PROBLEMS  *BOWEL PROBLEMS  UNUSUAL RASH Items with * indicate a potential emergency and should be followed up as soon as possible.  Feel free to call the clinic should you have any questions or concerns. The clinic phone number is [phone].  Please show the CHEMO ALERT CARD at check-in to the Emergency Department and triage nurse.   

## 2017-08-11 NOTE — Progress Notes (Signed)
Hematology and Oncology Follow Up Visit  Margaret Elliott [id number] Mar 18, 1951    08/11/17   Principle Diagnosis: 68 year old woman with stage IV colon cancer with the disease involvement and peritoneal lining as well as liver metastasis.  She was initially diagnosed in June 2014.The pathology showed an invasive, well- differentiated colorectal adenocarcinoma. Tumor invades through the muscularis propria, with 2/18 lymph nodes involved, with the pathological staging T3 N1b disease. Her tumor was found to be microsatellite stable.    Prior Therapy:  She is status post laparoscopic laparotomy right hemicolectomy with ileocolonic anastomosis done on December 09, 2012.   FOLFOX and a Avastin chemotherapy started 01/18/2013. She is S/P 12 cycles completed in 05/2013.   She is a status post microwave ablation of metastatic lesion in the posterior segment of the right lobe of the liver completed on 09/28/2014 and repeated on 11/30/2014.  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  FOLFIRI and a Avastin salvage therapy started on 12/10/2015.  Current therapy:     She is currently receiving maintenance 5-FU, leucovorin and a Avastin.   Interim History:  Margaret Elliott is here for a follow-up visit.  She reports some slight fatigue since last visit but has tolerated chemotherapy without any issues.  She denies any nausea, vomiting or infusion related complications.  She did report an episode of constipation that has resolved at this time after using extra fiber.   She continues to have issues with neuropathy in her lower extremities currently at grade 1 and has not changed.  He is able to ambulate without any difficulties and has not had any falls or syncope.  Appetite remained reasonable and maintained her weight.  He denies any complications related to her Port-A-Cath usage.  She denies any pain or discomfort.   She  reports no headaches, blurry vision, syncope or seizures.  She does not report any worsening neuropathy.  She does not report any fevers, chills or sweats.  She does not report any chest pain, palpitation or orthopnea. She does not report any wheezing, hemoptysis or hematemesis. She does not report any frequency urgency or hesitancy. Does not report any easy bruising or petechiae.  She denies any anxiety or depression with appropriate mood.  She does not report any arthralgias or myalgias.  She does not report any lymphadenopathy or petechiae.  She does not report any heat or cold intolerance.  Rest of her review of systems is negative.   Medications: I have reviewed the patient's current medications.  Current Outpatient Prescriptions  Medication Sig Dispense Refill  . acetaminophen (TYLENOL) 500 MG tablet Take 500 mg by mouth every 6 (six) hours as needed for pain.      . diphenhydrAMINE (BENADRYL) 25 mg capsule Take 25-50 mg by mouth daily as needed for itching. For allergic reaction      . lidocaine-prilocaine (EMLA) cream Apply 1 application topically as needed. Apply approx 1/2 tsp to skin over port, prior to chemotherapy treatments  1 kit  3  . Multiple Vitamin (MULTIVITAMIN) tablet Take 1 tablet by mouth daily.      . ondansetron (ZOFRAN) 8 MG tablet Take 1 tablet (8 mg total) by mouth every 8 (eight) hours as needed for nausea.  30 tablet  1   No current facility-administered medications for this visit.     Allergies: No Known Allergies  Past Medical History, Surgical history, Social history, and Family History updated and  remain unchanged.    Physical Exam: Blood pressure 123/85, pulse 87, temperature 98.7 F (37.1 C), temperature source Oral, resp. rate 18, height '5\' 4"'$  (1.626 m), weight 159 lb 3.2 oz (72.2 kg), SpO2 100 %.    ECOG: 0 General appearance: Well-appearing woman appeared without distress. Oropharynx: Mucous membranes are moist and pink. Head: Normocephalic  without any abnormalities. Eyes: Sclera anicteric. Lymph nodes: No lymphadenopathy noted in the cervical, axillary or inguinal areas. Heart: Regular rate and rhythm, S1 and S2. Lung: Clear without any rhonchi, wheezes or dullness to percussion. Abdomen: Soft, nontender without any rebound or guarding. Neurological: No motor, sensory or deep tendon reflex abnormalities.   Skin: No skin rashes or lesions. Musculoskeletal: No joint deformity or effusion. Psychiatric: Mood appeared appropriate.  CBC    Component Value Date/Time   WBC 4.3 08/11/2017 0918   RBC 3.94 08/11/2017 0918   HGB 12.9 08/11/2017 0918   HGB 12.3 05/26/2017 0809   HCT 39.3 08/11/2017 0918   HCT 37.6 05/26/2017 0809   PLT 199 08/11/2017 0918   PLT 180 05/26/2017 0809   MCV 99.7 08/11/2017 0918   MCV 100.3 05/26/2017 0809   MCH 32.7 08/11/2017 0918   MCHC 32.8 08/11/2017 0918   RDW 14.3 08/11/2017 0918   RDW 17.4 (H) 05/26/2017 0809   LYMPHSABS 1.1 08/11/2017 0918   LYMPHSABS 1.2 05/26/2017 0809   MONOABS 0.4 08/11/2017 0918   MONOABS 0.5 05/26/2017 0809   EOSABS 0.1 08/11/2017 0918   EOSABS 0.1 05/26/2017 0809   BASOSABS 0.0 08/11/2017 0918   BASOSABS 0.0 05/26/2017 0809       Impression and Plan:  67 year old woman with the following issues:   1.  Stage IV colon cancer with documented disease to the liver and the peritoneum: Her tumors and rest mutated.  She is status post multiple therapies outlined above.  He is currently receiving maintenance 5-FU, leucovorin and Avastin with reasonable tolerance. Imaging studies obtained in December 2018 continues to show stable disease.  Risks and benefits of continuing this regimen was discussed today and potential complications related to these drugs were reviewed.  She has no issues at this time and has no objections to continuing.  Plan is to repeat imaging studies in June 2019 sooner if needed 2.  We will also consider treatment holiday after few  treatments.   2. Intravenous access.  She does not report any issues related to Port-A-Cath use or access.  3. Neuropathy: Currently at sensory grade 1 in her lower extremities.  This is related to previous chemotherapy.  No intervention is needed at this time and neuropathy is manageable and not painful at this time.  4. Antiemetics: Compazine is available to her at this time to prevent nausea vomiting.  5.  Constipation: Resolved at this time.  We have discussed strategies to address this issues including increasing fiber and using laxative as needed.  6.  Prognosis: She has an incurable malignancy but her performance status is excellent.  Aggressive therapy is warranted at this time.  7. Followup: Will be in 2 weeks for the next cycle of maintenance chemotherapy.  25 minutes spent today face-to-face with the patient.  More than 50% of today's visit was spent on counseling, education and outlining future treatment options and overall prognosis.  Zola Button MD 08/11/17

## 2017-08-11 NOTE — Telephone Encounter (Signed)
Scheduled appt per 2/20 los - patient to get an updated calender in the treatment area.

## 2017-08-13 ENCOUNTER — Inpatient Hospital Stay: Payer: Medicare Other

## 2017-08-13 VITALS — BP 113/72 | HR 75 | Temp 98.2°F | Resp 18

## 2017-08-13 DIAGNOSIS — C786 Secondary malignant neoplasm of retroperitoneum and peritoneum: Secondary | ICD-10-CM | POA: Diagnosis not present

## 2017-08-13 DIAGNOSIS — Z5112 Encounter for antineoplastic immunotherapy: Secondary | ICD-10-CM | POA: Diagnosis not present

## 2017-08-13 DIAGNOSIS — C787 Secondary malignant neoplasm of liver and intrahepatic bile duct: Secondary | ICD-10-CM | POA: Diagnosis not present

## 2017-08-13 DIAGNOSIS — Z95828 Presence of other vascular implants and grafts: Secondary | ICD-10-CM

## 2017-08-13 DIAGNOSIS — Z452 Encounter for adjustment and management of vascular access device: Secondary | ICD-10-CM | POA: Diagnosis not present

## 2017-08-13 DIAGNOSIS — Z5111 Encounter for antineoplastic chemotherapy: Secondary | ICD-10-CM | POA: Diagnosis not present

## 2017-08-13 DIAGNOSIS — C180 Malignant neoplasm of cecum: Secondary | ICD-10-CM | POA: Diagnosis not present

## 2017-08-13 MED ORDER — SODIUM CHLORIDE 0.9 % IJ SOLN
10.0000 mL | INTRAMUSCULAR | Status: DC | PRN
  Administered 2017-08-13: 10 mL via INTRAVENOUS
  Filled 2017-08-13: qty 10

## 2017-08-13 MED ORDER — HEPARIN SOD (PORK) LOCK FLUSH 100 UNIT/ML IV SOLN
500.0000 [IU] | Freq: Once | INTRAVENOUS | Status: AC | PRN
  Administered 2017-08-13: 500 [IU] via INTRAVENOUS
  Filled 2017-08-13: qty 5

## 2017-08-25 ENCOUNTER — Inpatient Hospital Stay: Payer: Medicare Other

## 2017-08-25 ENCOUNTER — Inpatient Hospital Stay: Payer: Medicare Other | Attending: Oncology | Admitting: Oncology

## 2017-08-25 VITALS — BP 126/87

## 2017-08-25 VITALS — BP 135/93 | HR 81 | Temp 97.7°F | Resp 18 | Ht 64.0 in | Wt 159.2 lb

## 2017-08-25 DIAGNOSIS — R5383 Other fatigue: Secondary | ICD-10-CM | POA: Insufficient documentation

## 2017-08-25 DIAGNOSIS — Z95828 Presence of other vascular implants and grafts: Secondary | ICD-10-CM

## 2017-08-25 DIAGNOSIS — C189 Malignant neoplasm of colon, unspecified: Secondary | ICD-10-CM

## 2017-08-25 DIAGNOSIS — Z452 Encounter for adjustment and management of vascular access device: Secondary | ICD-10-CM | POA: Diagnosis not present

## 2017-08-25 DIAGNOSIS — C787 Secondary malignant neoplasm of liver and intrahepatic bile duct: Secondary | ICD-10-CM | POA: Insufficient documentation

## 2017-08-25 DIAGNOSIS — Z79899 Other long term (current) drug therapy: Secondary | ICD-10-CM | POA: Diagnosis not present

## 2017-08-25 DIAGNOSIS — M2550 Pain in unspecified joint: Secondary | ICD-10-CM | POA: Insufficient documentation

## 2017-08-25 DIAGNOSIS — G620 Drug-induced polyneuropathy: Secondary | ICD-10-CM | POA: Insufficient documentation

## 2017-08-25 DIAGNOSIS — Z5112 Encounter for antineoplastic immunotherapy: Secondary | ICD-10-CM | POA: Diagnosis not present

## 2017-08-25 DIAGNOSIS — C180 Malignant neoplasm of cecum: Secondary | ICD-10-CM | POA: Diagnosis not present

## 2017-08-25 DIAGNOSIS — T451X5S Adverse effect of antineoplastic and immunosuppressive drugs, sequela: Secondary | ICD-10-CM | POA: Insufficient documentation

## 2017-08-25 DIAGNOSIS — Z9221 Personal history of antineoplastic chemotherapy: Secondary | ICD-10-CM

## 2017-08-25 DIAGNOSIS — Z5111 Encounter for antineoplastic chemotherapy: Secondary | ICD-10-CM | POA: Diagnosis present

## 2017-08-25 DIAGNOSIS — C786 Secondary malignant neoplasm of retroperitoneum and peritoneum: Secondary | ICD-10-CM | POA: Diagnosis not present

## 2017-08-25 DIAGNOSIS — K769 Liver disease, unspecified: Secondary | ICD-10-CM

## 2017-08-25 DIAGNOSIS — K6389 Other specified diseases of intestine: Secondary | ICD-10-CM

## 2017-08-25 LAB — CBC WITH DIFFERENTIAL/PLATELET
BASOS ABS: 0 10*3/uL (ref 0.0–0.1)
Basophils Relative: 1 %
EOS PCT: 3 %
Eosinophils Absolute: 0.1 10*3/uL (ref 0.0–0.5)
HEMATOCRIT: 38.4 % (ref 34.8–46.6)
Hemoglobin: 12.6 g/dL (ref 11.6–15.9)
LYMPHS ABS: 1.7 10*3/uL (ref 0.9–3.3)
LYMPHS PCT: 34 %
MCH: 32.7 pg (ref 25.1–34.0)
MCHC: 32.8 g/dL (ref 31.5–36.0)
MCV: 99.7 fL (ref 79.5–101.0)
MONO ABS: 0.4 10*3/uL (ref 0.1–0.9)
MONOS PCT: 7 %
NEUTROS ABS: 2.8 10*3/uL (ref 1.5–6.5)
Neutrophils Relative %: 55 %
PLATELETS: 180 10*3/uL (ref 145–400)
RBC: 3.85 MIL/uL (ref 3.70–5.45)
RDW: 14.9 % — AB (ref 11.2–14.5)
WBC: 5 10*3/uL (ref 3.9–10.3)

## 2017-08-25 LAB — COMPREHENSIVE METABOLIC PANEL
ALT: 12 U/L (ref 0–55)
ANION GAP: 8 (ref 3–11)
AST: 18 U/L (ref 5–34)
Albumin: 3.5 g/dL (ref 3.5–5.0)
Alkaline Phosphatase: 64 U/L (ref 40–150)
BILIRUBIN TOTAL: 0.7 mg/dL (ref 0.2–1.2)
BUN: 18 mg/dL (ref 7–26)
CO2: 24 mmol/L (ref 22–29)
Calcium: 9.5 mg/dL (ref 8.4–10.4)
Chloride: 108 mmol/L (ref 98–109)
Creatinine, Ser: 1.06 mg/dL (ref 0.60–1.10)
GFR calc Af Amer: >60 mL/min (ref >60)
GFR calc non Af Amer: 53 mL/min — ABNORMAL LOW (ref >60)
GLUCOSE: 117 mg/dL (ref 70–140)
POTASSIUM: 3.9 mmol/L (ref 3.5–5.1)
SODIUM: 140 mmol/L (ref 136–145)
TOTAL PROTEIN: 7.2 g/dL (ref 6.4–8.3)

## 2017-08-25 LAB — CEA (IN HOUSE-CHCC): CEA (CHCC-In House): 3.25 ng/mL (ref 0.00–5.00)

## 2017-08-25 MED ORDER — SODIUM CHLORIDE 0.9 % IV SOLN
Freq: Once | INTRAVENOUS | Status: AC
  Administered 2017-08-25: 10:00:00 via INTRAVENOUS

## 2017-08-25 MED ORDER — SODIUM CHLORIDE 0.9 % IJ SOLN
10.0000 mL | INTRAMUSCULAR | Status: DC | PRN
  Administered 2017-08-25: 10 mL via INTRAVENOUS
  Filled 2017-08-25: qty 10

## 2017-08-25 MED ORDER — PALONOSETRON HCL INJECTION 0.25 MG/5ML
INTRAVENOUS | Status: AC
  Filled 2017-08-25: qty 5

## 2017-08-25 MED ORDER — LORAZEPAM 1 MG PO TABS
ORAL_TABLET | ORAL | Status: AC
  Filled 2017-08-25: qty 1

## 2017-08-25 MED ORDER — BEVACIZUMAB CHEMO INJECTION 400 MG/16ML
5.0000 mg/kg | Freq: Once | INTRAVENOUS | Status: AC
  Administered 2017-08-25: 400 mg via INTRAVENOUS
  Filled 2017-08-25: qty 16

## 2017-08-25 MED ORDER — DEXTROSE 5 % IV SOLN
400.0000 mg/m2 | Freq: Once | INTRAVENOUS | Status: AC
  Administered 2017-08-25: 752 mg via INTRAVENOUS
  Filled 2017-08-25: qty 37.6

## 2017-08-25 MED ORDER — LORAZEPAM 1 MG PO TABS
1.0000 mg | ORAL_TABLET | Freq: Once | ORAL | Status: AC
  Administered 2017-08-25: 1 mg via ORAL

## 2017-08-25 MED ORDER — SODIUM CHLORIDE 0.9 % IV SOLN
2400.0000 mg/m2 | INTRAVENOUS | Status: DC
  Administered 2017-08-25: 4500 mg via INTRAVENOUS
  Filled 2017-08-25: qty 90

## 2017-08-25 MED ORDER — DEXAMETHASONE SODIUM PHOSPHATE 10 MG/ML IJ SOLN
10.0000 mg | Freq: Once | INTRAMUSCULAR | Status: AC
  Administered 2017-08-25: 10 mg via INTRAVENOUS

## 2017-08-25 MED ORDER — FLUOROURACIL CHEMO INJECTION 2.5 GM/50ML
400.0000 mg/m2 | Freq: Once | INTRAVENOUS | Status: AC
  Administered 2017-08-25: 750 mg via INTRAVENOUS
  Filled 2017-08-25: qty 15

## 2017-08-25 MED ORDER — DEXAMETHASONE SODIUM PHOSPHATE 10 MG/ML IJ SOLN
INTRAMUSCULAR | Status: AC
  Filled 2017-08-25: qty 1

## 2017-08-25 MED ORDER — PALONOSETRON HCL INJECTION 0.25 MG/5ML
0.2500 mg | Freq: Once | INTRAVENOUS | Status: AC
  Administered 2017-08-25: 0.25 mg via INTRAVENOUS

## 2017-08-25 NOTE — Patient Instructions (Signed)
[facility] Discharge Instructions for Patients Receiving Chemotherapy  Today you received the following chemotherapy agents :  Avastin, Leucovorin, Fluorouracil.  To help prevent nausea and vomiting after your treatment, we encourage you to take your nausea medication as prescribed.   If you develop nausea and vomiting that is not controlled by your nausea medication, call the clinic.   BELOW ARE SYMPTOMS THAT SHOULD BE REPORTED IMMEDIATELY:  *FEVER GREATER THAN 100.5 F  *CHILLS WITH OR WITHOUT FEVER  NAUSEA AND VOMITING THAT IS NOT CONTROLLED WITH YOUR NAUSEA MEDICATION  *UNUSUAL SHORTNESS OF BREATH  *UNUSUAL BRUISING OR BLEEDING  TENDERNESS IN MOUTH AND THROAT WITH OR WITHOUT PRESENCE OF ULCERS  *URINARY PROBLEMS  *BOWEL PROBLEMS  UNUSUAL RASH Items with * indicate a potential emergency and should be followed up as soon as possible.  Feel free to call the clinic should you have any questions or concerns. The clinic phone number is [phone].  Please show the CHEMO ALERT CARD at check-in to the Emergency Department and triage nurse.   

## 2017-08-25 NOTE — Progress Notes (Signed)
Hematology and Oncology Follow Up Visit  Margaret Elliott [id number] 1950/08/14    08/25/17   Principle Diagnosis: 67 year old woman with stage IV colon cancer diagnosed in June 2014. At that time she had a T3 N1 disease. Now has metastatic disease involvement and peritoneal lining as well as liver metastasis. Her tumor was found to be microsatellite stable with NRAS mutation.   Prior Therapy:  She is status post laparoscopic laparotomy right hemicolectomy with ileocolonic anastomosis done on December 09, 2012.   FOLFOX and a Avastin chemotherapy started 01/18/2013. She is S/P 12 cycles completed in 05/2013.   She is a status post microwave ablation of metastatic lesion in the posterior segment of the right lobe of the liver completed on 09/28/2014 and repeated on 11/30/2014.  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  FOLFIRI and a Avastin salvage therapy started on 12/10/2015.  Current therapy:     She is currently receiving maintenance 5-FU, leucovorin and a Avastin.   Interim History:  Margaret Elliott presents today for a follow-up visit. She reports no major changes in her health since the last cycle of chemotherapy. He denies any complications related to her Port-A-Cath usage. She does report persistent sensory neuropathy that is exacerbated to painful neuropathy with walking or water exposure to her fingers. She still able to perform most activities of daily living and attends husband that has been sick for the last few weeks.  She denied any competitions related to chemotherapy including nausea, vomiting or infusion related complications. She denied any abdominal distention or early satiety.   She reports no headaches, blurry vision, syncope or seizures.  She does not report any fevers, chills or sweats.  She does not report any chest pain, palpitation or orthopnea. She does not report any wheezing, hemoptysis  or hematemesis. She does not report any frequency urgency or hesitancy. Does not report any easy bruising or petechiae.  She denies any anxiety or depression. She does not report any arthralgias or myalgias.  She does not report any lymphadenopathy or petechiae.  She does not report any heat or cold intolerance.  Rest of her review of systems is negative.   Medications: I have reviewed the patient's current medications.  Current Outpatient Prescriptions  Medication Sig Dispense Refill  . acetaminophen (TYLENOL) 500 MG tablet Take 500 mg by mouth every 6 (six) hours as needed for pain.      . diphenhydrAMINE (BENADRYL) 25 mg capsule Take 25-50 mg by mouth daily as needed for itching. For allergic reaction      . lidocaine-prilocaine (EMLA) cream Apply 1 application topically as needed. Apply approx 1/2 tsp to skin over port, prior to chemotherapy treatments  1 kit  3  . Multiple Vitamin (MULTIVITAMIN) tablet Take 1 tablet by mouth daily.      . ondansetron (ZOFRAN) 8 MG tablet Take 1 tablet (8 mg total) by mouth every 8 (eight) hours as needed for nausea.  30 tablet  1   No current facility-administered medications for this visit.     Allergies: No Known Allergies  Past Medical History, Surgical history, Social history, and Family History updated and remain unchanged.    Physical Exam: Blood pressure (!) 135/93, pulse 81, temperature 97.7 F (36.5 C), temperature source Oral, resp. rate 18, height '5\' 4"'$  (1.626 m), weight 159 lb 3.2 oz (72.2 kg), SpO2 100 %.    ECOG: 0 General appearance: Alert, awake woman  without distress. Oropharynx: Negative siblings are moist and pink without ulcerations or thrush. Head: Atraumatic without abnormalities. Eyes: Pupils are equal and round and reactive to light. Lymph nodes: No lymphadenopathy noted in the cervical, axillary or inguinal areas. Heart: Regular rate and rhythm without murmurs rubs or gallops. Lung: Clear in all lung fields without  rhonchi or wheezes or dullness to percussion. Abdomen: Soft, nontender without any rebound or guarding. No shifting dullness or ascites. Neurological: No deficits noted in her exam. She has intact motor, sensory and deep tendon reflexes. Skin: Slight discoloration noted in her palms and soles. Slight cracking noted. No desquamation. Musculoskeletal: Full range of motion noted in all her joints. Psychiatric: Affect is appropriate.  CBC    Component Value Date/Time   WBC 5.0 08/25/2017 0845   RBC 3.85 08/25/2017 0845   HGB 12.6 08/25/2017 0845   HGB 12.3 05/26/2017 0809   HCT 38.4 08/25/2017 0845   HCT 37.6 05/26/2017 0809   PLT 180 08/25/2017 0845   PLT 180 05/26/2017 0809   MCV 99.7 08/25/2017 0845   MCV 100.3 05/26/2017 0809   MCH 32.7 08/25/2017 0845   MCHC 32.8 08/25/2017 0845   RDW 14.9 (H) 08/25/2017 0845   RDW 17.4 (H) 05/26/2017 0809   LYMPHSABS 1.7 08/25/2017 0845   LYMPHSABS 1.2 05/26/2017 0809   MONOABS 0.4 08/25/2017 0845   MONOABS 0.5 05/26/2017 0809   EOSABS 0.1 08/25/2017 0845   EOSABS 0.1 05/26/2017 0809   BASOSABS 0.0 08/25/2017 0845   BASOSABS 0.0 05/26/2017 0809       Impression and Plan:  67 year old woman with:   1.  Stage IV colon cancer diagnosed in June 2014. At that time she was found to have T3 N1 disease with liver metastasis. She has a peritoneal metastasis that has been documented and biopsy-proven. She does have multiple therapies outlined above.  She remains on maintenance therapy with 5-FU and leucovorin and Avastin with relatively stable disease. Her last imaging studies in December 2019 showed no progression of disease.  Risks and benefits of continuing this therapy was reviewed today with the patient in detail. Complications from this therapy can include worsening neuropathy, hand-foot syndrome among others were discussed. After discussing the risks and benefits she is agreeable to continue and we'll repeat imaging studies in June 2019. A  treatment break may be needed in the interim if she develops worsening symptoms.   2. Intravenous access. Port-A-Cath remained in use without any recent complications.  3. Neuropathy: Mild and manageable in her lower and upper extremities. We have discussed the role of Neurontin which we will defer at this time after discussing complications associated with this medication.  4. Antiemetics: No nausea or vomiting reported but Compazine is available to her as needed.  5.  Prognosis: She has an incurable malignancy but her disease has been stable and has been palliated adequately. Performance status is excellent and I aggressive therapy is warranted.  6. Followup: Will be in 2 weeks for the next cycle of maintenance chemotherapy.  25 minutes spent today face-to-face with the patient.  More than 50% of today's visit was spent on counseling, education and answering questions regarding plans of care and coordinating of her care.  Zola Button MD 08/25/17

## 2017-08-25 NOTE — Patient Instructions (Signed)

## 2017-08-25 NOTE — Progress Notes (Signed)
Per Dr Alen Blew, may give Avastin with BP 116/95

## 2017-08-27 ENCOUNTER — Inpatient Hospital Stay: Payer: Medicare Other

## 2017-08-27 DIAGNOSIS — Z95828 Presence of other vascular implants and grafts: Secondary | ICD-10-CM

## 2017-08-27 DIAGNOSIS — Z5112 Encounter for antineoplastic immunotherapy: Secondary | ICD-10-CM | POA: Diagnosis not present

## 2017-08-27 DIAGNOSIS — C787 Secondary malignant neoplasm of liver and intrahepatic bile duct: Secondary | ICD-10-CM | POA: Diagnosis not present

## 2017-08-27 DIAGNOSIS — Z9221 Personal history of antineoplastic chemotherapy: Secondary | ICD-10-CM | POA: Diagnosis not present

## 2017-08-27 DIAGNOSIS — C180 Malignant neoplasm of cecum: Secondary | ICD-10-CM | POA: Diagnosis not present

## 2017-08-27 DIAGNOSIS — C786 Secondary malignant neoplasm of retroperitoneum and peritoneum: Secondary | ICD-10-CM | POA: Diagnosis not present

## 2017-08-27 DIAGNOSIS — Z452 Encounter for adjustment and management of vascular access device: Secondary | ICD-10-CM | POA: Diagnosis not present

## 2017-08-27 MED ORDER — SODIUM CHLORIDE 0.9 % IJ SOLN
10.0000 mL | INTRAMUSCULAR | Status: DC | PRN
  Administered 2017-08-27: 10 mL via INTRAVENOUS
  Filled 2017-08-27: qty 10

## 2017-08-27 MED ORDER — HEPARIN SOD (PORK) LOCK FLUSH 100 UNIT/ML IV SOLN
500.0000 [IU] | Freq: Once | INTRAVENOUS | Status: AC | PRN
  Administered 2017-08-27: 500 [IU] via INTRAVENOUS
  Filled 2017-08-27: qty 5

## 2017-08-27 NOTE — Patient Instructions (Signed)

## 2017-08-31 ENCOUNTER — Ambulatory Visit: Payer: Medicare Other

## 2017-09-08 ENCOUNTER — Inpatient Hospital Stay: Payer: Medicare Other

## 2017-09-08 ENCOUNTER — Inpatient Hospital Stay (HOSPITAL_BASED_OUTPATIENT_CLINIC_OR_DEPARTMENT_OTHER): Payer: Medicare Other | Admitting: Oncology

## 2017-09-08 ENCOUNTER — Telehealth: Payer: Self-pay

## 2017-09-08 VITALS — BP 148/91 | HR 73

## 2017-09-08 VITALS — BP 152/92 | HR 84 | Temp 97.8°F | Resp 18 | Ht 64.0 in | Wt 158.6 lb

## 2017-09-08 DIAGNOSIS — Z452 Encounter for adjustment and management of vascular access device: Secondary | ICD-10-CM | POA: Diagnosis not present

## 2017-09-08 DIAGNOSIS — G620 Drug-induced polyneuropathy: Secondary | ICD-10-CM

## 2017-09-08 DIAGNOSIS — C180 Malignant neoplasm of cecum: Secondary | ICD-10-CM | POA: Diagnosis not present

## 2017-09-08 DIAGNOSIS — K769 Liver disease, unspecified: Secondary | ICD-10-CM

## 2017-09-08 DIAGNOSIS — C189 Malignant neoplasm of colon, unspecified: Secondary | ICD-10-CM

## 2017-09-08 DIAGNOSIS — Z5112 Encounter for antineoplastic immunotherapy: Secondary | ICD-10-CM | POA: Diagnosis not present

## 2017-09-08 DIAGNOSIS — C786 Secondary malignant neoplasm of retroperitoneum and peritoneum: Secondary | ICD-10-CM | POA: Diagnosis not present

## 2017-09-08 DIAGNOSIS — G893 Neoplasm related pain (acute) (chronic): Secondary | ICD-10-CM | POA: Diagnosis not present

## 2017-09-08 DIAGNOSIS — Z9221 Personal history of antineoplastic chemotherapy: Secondary | ICD-10-CM | POA: Diagnosis not present

## 2017-09-08 DIAGNOSIS — C787 Secondary malignant neoplasm of liver and intrahepatic bile duct: Principal | ICD-10-CM

## 2017-09-08 DIAGNOSIS — K6389 Other specified diseases of intestine: Secondary | ICD-10-CM

## 2017-09-08 DIAGNOSIS — Z95828 Presence of other vascular implants and grafts: Secondary | ICD-10-CM

## 2017-09-08 LAB — CBC WITH DIFFERENTIAL/PLATELET
Basophils Absolute: 0 10*3/uL (ref 0.0–0.1)
Basophils Relative: 0 %
EOS ABS: 0.1 10*3/uL (ref 0.0–0.5)
Eosinophils Relative: 2 %
HEMATOCRIT: 35.7 % (ref 34.8–46.6)
HEMOGLOBIN: 12 g/dL (ref 11.6–15.9)
LYMPHS ABS: 1 10*3/uL (ref 0.9–3.3)
LYMPHS PCT: 24 %
MCH: 32.9 pg (ref 25.1–34.0)
MCHC: 33.5 g/dL (ref 31.5–36.0)
MCV: 98.1 fL (ref 79.5–101.0)
MONOS PCT: 9 %
Monocytes Absolute: 0.4 10*3/uL (ref 0.1–0.9)
NEUTROS ABS: 2.7 10*3/uL (ref 1.5–6.5)
NEUTROS PCT: 65 %
Platelets: 186 10*3/uL (ref 145–400)
RBC: 3.63 MIL/uL — AB (ref 3.70–5.45)
RDW: 15.6 % — ABNORMAL HIGH (ref 11.2–14.5)
WBC: 4.2 10*3/uL (ref 3.9–10.3)

## 2017-09-08 LAB — COMPREHENSIVE METABOLIC PANEL WITH GFR
ALT: 12 U/L (ref 0–55)
AST: 18 U/L (ref 5–34)
Albumin: 3.4 g/dL — ABNORMAL LOW (ref 3.5–5.0)
Alkaline Phosphatase: 80 U/L (ref 40–150)
Anion gap: 7 (ref 3–11)
BUN: 16 mg/dL (ref 7–26)
CO2: 24 mmol/L (ref 22–29)
Calcium: 9.4 mg/dL (ref 8.4–10.4)
Chloride: 110 mmol/L — ABNORMAL HIGH (ref 98–109)
Creatinine, Ser: 0.92 mg/dL (ref 0.60–1.10)
GFR calc Af Amer: >60 mL/min
GFR calc non Af Amer: >60 mL/min
Glucose, Bld: 105 mg/dL (ref 70–140)
Potassium: 3.4 mmol/L — ABNORMAL LOW (ref 3.5–5.1)
Sodium: 141 mmol/L (ref 136–145)
Total Bilirubin: 0.7 mg/dL (ref 0.2–1.2)
Total Protein: 7 g/dL (ref 6.4–8.3)

## 2017-09-08 LAB — CEA (IN HOUSE-CHCC): CEA (CHCC-In House): 3.32 ng/mL (ref 0.00–5.00)

## 2017-09-08 LAB — TOTAL PROTEIN, URINE DIPSTICK: Protein, ur: NEGATIVE mg/dL

## 2017-09-08 MED ORDER — PALONOSETRON HCL INJECTION 0.25 MG/5ML
INTRAVENOUS | Status: AC
  Filled 2017-09-08: qty 5

## 2017-09-08 MED ORDER — LIDOCAINE-PRILOCAINE 2.5-2.5 % EX KIT: 1.0000 "application " | PACK | CUTANEOUS | 3 refills | Status: DC | PRN

## 2017-09-08 MED ORDER — DEXAMETHASONE SODIUM PHOSPHATE 10 MG/ML IJ SOLN
INTRAMUSCULAR | Status: AC
  Filled 2017-09-08: qty 1

## 2017-09-08 MED ORDER — PALONOSETRON HCL INJECTION 0.25 MG/5ML
0.2500 mg | Freq: Once | INTRAVENOUS | Status: AC
  Administered 2017-09-08: 0.25 mg via INTRAVENOUS

## 2017-09-08 MED ORDER — DEXAMETHASONE SODIUM PHOSPHATE 10 MG/ML IJ SOLN
10.0000 mg | Freq: Once | INTRAMUSCULAR | Status: AC
  Administered 2017-09-08: 10 mg via INTRAVENOUS

## 2017-09-08 MED ORDER — SODIUM CHLORIDE 0.9% FLUSH
10.0000 mL | INTRAVENOUS | Status: DC | PRN
  Filled 2017-09-08: qty 10

## 2017-09-08 MED ORDER — SODIUM CHLORIDE 0.9 % IV SOLN
5.0000 mg/kg | Freq: Once | INTRAVENOUS | Status: AC
  Administered 2017-09-08: 400 mg via INTRAVENOUS
  Filled 2017-09-08: qty 16

## 2017-09-08 MED ORDER — SODIUM CHLORIDE 0.9 % IV SOLN
2400.0000 mg/m2 | INTRAVENOUS | Status: DC
  Administered 2017-09-08: 4500 mg via INTRAVENOUS
  Filled 2017-09-08: qty 90

## 2017-09-08 MED ORDER — HYDROCODONE-ACETAMINOPHEN 5-325 MG PO TABS: 1.0000 | ORAL_TABLET | ORAL | 0 refills | Status: DC | PRN

## 2017-09-08 MED ORDER — LORAZEPAM 1 MG PO TABS
ORAL_TABLET | ORAL | Status: AC
  Filled 2017-09-08: qty 1

## 2017-09-08 MED ORDER — FLUOROURACIL CHEMO INJECTION 2.5 GM/50ML
400.0000 mg/m2 | Freq: Once | INTRAVENOUS | Status: AC
Start: 2017-09-08 — End: 2017-09-08
  Administered 2017-09-08: 750 mg via INTRAVENOUS
  Filled 2017-09-08: qty 15

## 2017-09-08 MED ORDER — SODIUM CHLORIDE 0.9 % IJ SOLN
10.0000 mL | INTRAMUSCULAR | Status: DC | PRN
  Administered 2017-09-08: 10 mL via INTRAVENOUS
  Filled 2017-09-08: qty 10

## 2017-09-08 MED ORDER — LEUCOVORIN CALCIUM INJECTION 350 MG
400.0000 mg/m2 | Freq: Once | INTRAVENOUS | Status: AC
  Administered 2017-09-08: 752 mg via INTRAVENOUS
  Filled 2017-09-08: qty 37.6

## 2017-09-08 MED ORDER — HEPARIN SOD (PORK) LOCK FLUSH 100 UNIT/ML IV SOLN
500.0000 [IU] | Freq: Once | INTRAVENOUS | Status: DC | PRN
  Filled 2017-09-08: qty 5

## 2017-09-08 MED ORDER — LORAZEPAM 1 MG PO TABS
1.0000 mg | ORAL_TABLET | Freq: Once | ORAL | Status: AC
  Administered 2017-09-08: 1 mg via ORAL

## 2017-09-08 MED ORDER — SODIUM CHLORIDE 0.9 % IV SOLN
Freq: Once | INTRAVENOUS | Status: AC
  Administered 2017-09-08: 10:00:00 via INTRAVENOUS

## 2017-09-08 NOTE — Telephone Encounter (Signed)
Spoke with patient concerning upcoming appointment changes per 3/20 inbasket message to change pump start to time to 11:30.

## 2017-09-08 NOTE — Progress Notes (Signed)
Hematology and Oncology Follow Up Visit  Margaret Elliott [id number] [birth year]    09/08/17   Principle Diagnosis: 67 year old woman with colon cancer initially presented with a T3 N1 disease diagnosed in June 2014.  She subsequently developed stage IV disease with peritoneal metastasis.  Her tumor is microsatellite stable with NRAS mutation.   Prior Therapy:  She is status post laparoscopic laparotomy right hemicolectomy with ileocolonic anastomosis done on December 09, 2012.   FOLFOX and a Avastin chemotherapy started 01/18/2013. She is S/P 12 cycles completed in 05/2013.   She is a status post microwave ablation of metastatic lesion in the posterior segment of the right lobe of the liver completed on 09/28/2014 and repeated on 11/30/2014.  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  FOLFIRI and a Avastin salvage therapy started on 12/10/2015.  Current therapy:     She is currently receiving maintenance 5-FU, leucovorin and a Avastin for maintenance purposes.    Interim History:  Margaret Elliott is here for a follow-up.  She reports more fatigue and tiredness associated with her last cycle of chemotherapy.  She denied any nausea, vomiting but does report occasional diarrhea.  She also reported more arthralgias associated with this chemotherapy especially after discontinuation of her pump out of her Port-A-Cath.  She denies any fevers or recent infections.  She does take hydrocodone which have helped relieve her arthralgias.  She also also reported worsening discoloration and irritation in her palms.  Her mobility remains adequate although slightly declined.  She reports no headaches, blurry vision, syncope or seizures.  She does not report any fevers, chills or sweats.  Appetite and weight remains stable.  She does not report any chest pain, palpitation or orthopnea. She does not report any wheezing, hemoptysis or  hematemesis. She does not report any frequency urgency or hesitancy. Does not report any easy bruising or petechiae.  She denies any changes in her mood.  She does not report any arthralgias or myalgias.  She does not report any lymphadenopathy or petechiae.  She does not report any heat or cold intolerance.  Rest of her review of systems is negative.   Medications: I have reviewed the patient's current medications.  Current Outpatient Prescriptions  Medication Sig Dispense Refill  . acetaminophen (TYLENOL) 500 MG tablet Take 500 mg by mouth every 6 (six) hours as needed for pain.      . diphenhydrAMINE (BENADRYL) 25 mg capsule Take 25-50 mg by mouth daily as needed for itching. For allergic reaction      . lidocaine-prilocaine (EMLA) cream Apply 1 application topically as needed. Apply approx 1/2 tsp to skin over port, prior to chemotherapy treatments  1 kit  3  . Multiple Vitamin (MULTIVITAMIN) tablet Take 1 tablet by mouth daily.      . ondansetron (ZOFRAN) 8 MG tablet Take 1 tablet (8 mg total) by mouth every 8 (eight) hours as needed for nausea.  30 tablet  1   No current facility-administered medications for this visit.     Allergies: No Known Allergies  Past Medical History, Surgical history, Social history, and Family History reviewed today and remain unchanged.    Physical Exam: Blood pressure (!) 152/92, pulse 84, temperature 97.8 F (36.6 C), temperature source Oral, resp. rate 18, height _0  (1.626 m), weight 158 lb 9.6 oz (71.9 kg), SpO2 99 %.    ECOG: 0 General appearance: Well-appearing woman appeared fatigued. Oropharynx:  No thrush or ulcers noted. Head: Normocephalic without abnormalities. Eyes: Sclera anicteric. Lymph nodes: Cervical, axillary and inguinal nodal basins are normal. Heart: No murmurs or gallops with regular rate and rhythm. Lung: Clear to auscultation without any wheezes or dullness to percussion. Abdomen: Soft nontender without any rebound or  guarding. Neurological: No motor, sensory deficits. Skin: Worsening discoloration noted on palms with cracking noted. Musculoskeletal: Full range of motion noted in all extremities. Psychiatric: Appropriate mood and affect.  CBC    Component Value Date/Time   WBC 4.2 09/08/2017 0834   RBC 3.63 (L) 09/08/2017 0834   HGB 12.0 09/08/2017 0834   HGB 12.3 05/26/2017 0809   HCT 35.7 09/08/2017 0834   HCT 37.6 05/26/2017 0809   PLT 186 09/08/2017 0834   PLT 180 05/26/2017 0809   MCV 98.1 09/08/2017 0834   MCV 100.3 05/26/2017 0809   MCH 32.9 09/08/2017 0834   MCHC 33.5 09/08/2017 0834   RDW 15.6 (H) 09/08/2017 0834   RDW 17.4 (H) 05/26/2017 0809   LYMPHSABS 1.0 09/08/2017 0834   LYMPHSABS 1.2 05/26/2017 0809   MONOABS 0.4 09/08/2017 0834   MONOABS 0.5 05/26/2017 0809   EOSABS 0.1 09/08/2017 0834   EOSABS 0.1 05/26/2017 0809   BASOSABS 0.0 09/08/2017 0834   BASOSABS 0.0 05/26/2017 0809       Impression and Plan:  67 year old woman with:   1.  Stage IV colon cancer with a peritoneal involvement.  Her initial presentation in 2014 with T3N1 disease.  She developed liver and peritoneal metastasis.    She is currently on maintenance 5-FU, leucovorin and a Avastin and her disease appears to be stable.  Risks and benefits of continuing this medication were reviewed today.  She is experiencing more toxicities at this time including grade 2 fatigue and hand-foot syndrome.  Given these findings, treatment break is recommended.  She will resume therapy in 4 weeks but she is ready to proceed with treatment today.   2. Intravenous access. Port-A-Cath remain in use without complications.  No issues obtaining blood sample or infusion chemotherapy.  3. Neuropathy: Unchanged at this time.  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  Neurological exam is unchanged.  4.  Nausea prophylaxis: She continues to be on Compazine.  No nausea or vomiting noted.   5.  Arthralgias: Related to  chemotherapy.  Prescription for hydrocodone is made available to the patient today.  6.  Prognosis: She remains in excellent health and performance status.  Aggressive therapy is warranted although her disease is incurable.  7. Followup: Will be in 4 weeks for the next cycle of maintenance chemotherapy.  25 minutes spent today face-to-face with the patient.  More than 50% of today's visit was spent on counseling, education and coordination of her multifaceted care.   Zola Button MD 09/08/17

## 2017-09-08 NOTE — Patient Instructions (Signed)
[place] [place] Discharge Instructions for Patients Receiving Chemotherapy  Today you received the following chemotherapy agents Leucovorin,Avastin, Adrucil  To help prevent nausea and vomiting after your treatment, we encourage you to take your nausea medication as directed If you develop nausea and vomiting that is not controlled by your nausea medication, call the clinic.   BELOW ARE SYMPTOMS THAT SHOULD BE REPORTED IMMEDIATELY:  *FEVER GREATER THAN 100.5 F  *CHILLS WITH OR WITHOUT FEVER  NAUSEA AND VOMITING THAT IS NOT CONTROLLED WITH YOUR NAUSEA MEDICATION  *UNUSUAL SHORTNESS OF BREATH  *UNUSUAL BRUISING OR BLEEDING  TENDERNESS IN MOUTH AND THROAT WITH OR WITHOUT PRESENCE OF ULCERS  *URINARY PROBLEMS  *BOWEL PROBLEMS  UNUSUAL RASH Items with * indicate a potential emergency and should be followed up as soon as possible.  Feel free to call the clinic should you have any questions or concerns. The clinic phone number is [phone].  Please show the [place] at check-in to the Emergency Department and triage nurse.

## 2017-09-10 ENCOUNTER — Inpatient Hospital Stay: Payer: Medicare Other

## 2017-09-10 VITALS — BP 142/88 | HR 81 | Temp 98.2°F | Resp 17

## 2017-09-10 DIAGNOSIS — C787 Secondary malignant neoplasm of liver and intrahepatic bile duct: Secondary | ICD-10-CM | POA: Diagnosis not present

## 2017-09-10 DIAGNOSIS — C786 Secondary malignant neoplasm of retroperitoneum and peritoneum: Secondary | ICD-10-CM | POA: Diagnosis not present

## 2017-09-10 DIAGNOSIS — K6389 Other specified diseases of intestine: Secondary | ICD-10-CM

## 2017-09-10 DIAGNOSIS — Z452 Encounter for adjustment and management of vascular access device: Secondary | ICD-10-CM | POA: Diagnosis not present

## 2017-09-10 DIAGNOSIS — Z9221 Personal history of antineoplastic chemotherapy: Secondary | ICD-10-CM | POA: Diagnosis not present

## 2017-09-10 DIAGNOSIS — Z5112 Encounter for antineoplastic immunotherapy: Secondary | ICD-10-CM | POA: Diagnosis not present

## 2017-09-10 DIAGNOSIS — C180 Malignant neoplasm of cecum: Secondary | ICD-10-CM | POA: Diagnosis not present

## 2017-09-10 DIAGNOSIS — K769 Liver disease, unspecified: Secondary | ICD-10-CM

## 2017-09-10 DIAGNOSIS — C189 Malignant neoplasm of colon, unspecified: Secondary | ICD-10-CM

## 2017-09-10 MED ORDER — HEPARIN SOD (PORK) LOCK FLUSH 100 UNIT/ML IV SOLN
500.0000 [IU] | Freq: Once | INTRAVENOUS | Status: AC | PRN
  Administered 2017-09-10: 500 [IU]
  Filled 2017-09-10: qty 5

## 2017-09-10 MED ORDER — SODIUM CHLORIDE 0.9% FLUSH
10.0000 mL | INTRAVENOUS | Status: DC | PRN
  Administered 2017-09-10: 10 mL
  Filled 2017-09-10: qty 10

## 2017-09-17 ENCOUNTER — Encounter: Payer: Self-pay | Admitting: Oncology

## 2017-09-17 NOTE — Progress Notes (Signed)
Pt's Lidocaine-Prilocaine cream was approved from 09/16/17 to 12/18/17.

## 2017-09-17 NOTE — Progress Notes (Signed)
Submitted auth request for Lidocaine-Prilocaine cream today.  Status is pending.  °

## 2017-09-22 ENCOUNTER — Ambulatory Visit: Payer: Medicare Other | Admitting: Oncology

## 2017-09-22 ENCOUNTER — Ambulatory Visit: Payer: Medicare Other

## 2017-09-22 ENCOUNTER — Other Ambulatory Visit: Payer: Medicare Other

## 2017-10-06 ENCOUNTER — Inpatient Hospital Stay: Payer: Medicare Other

## 2017-10-06 ENCOUNTER — Inpatient Hospital Stay: Payer: Medicare Other | Attending: Oncology | Admitting: Oncology

## 2017-10-06 ENCOUNTER — Telehealth: Payer: Self-pay | Admitting: Oncology

## 2017-10-06 VITALS — BP 128/85 | HR 84 | Temp 98.2°F | Resp 18 | Ht 64.0 in | Wt 157.8 lb

## 2017-10-06 VITALS — BP 123/79 | HR 83 | Temp 97.9°F | Resp 18

## 2017-10-06 DIAGNOSIS — R110 Nausea: Secondary | ICD-10-CM | POA: Diagnosis not present

## 2017-10-06 DIAGNOSIS — T451X5S Adverse effect of antineoplastic and immunosuppressive drugs, sequela: Secondary | ICD-10-CM | POA: Diagnosis not present

## 2017-10-06 DIAGNOSIS — G620 Drug-induced polyneuropathy: Secondary | ICD-10-CM | POA: Insufficient documentation

## 2017-10-06 DIAGNOSIS — Z452 Encounter for adjustment and management of vascular access device: Secondary | ICD-10-CM | POA: Insufficient documentation

## 2017-10-06 DIAGNOSIS — C189 Malignant neoplasm of colon, unspecified: Secondary | ICD-10-CM

## 2017-10-06 DIAGNOSIS — C786 Secondary malignant neoplasm of retroperitoneum and peritoneum: Secondary | ICD-10-CM

## 2017-10-06 DIAGNOSIS — C787 Secondary malignant neoplasm of liver and intrahepatic bile duct: Secondary | ICD-10-CM

## 2017-10-06 DIAGNOSIS — M25512 Pain in left shoulder: Secondary | ICD-10-CM

## 2017-10-06 DIAGNOSIS — Z79899 Other long term (current) drug therapy: Secondary | ICD-10-CM | POA: Insufficient documentation

## 2017-10-06 DIAGNOSIS — C180 Malignant neoplasm of cecum: Secondary | ICD-10-CM | POA: Diagnosis not present

## 2017-10-06 DIAGNOSIS — K769 Liver disease, unspecified: Secondary | ICD-10-CM

## 2017-10-06 DIAGNOSIS — R109 Unspecified abdominal pain: Secondary | ICD-10-CM | POA: Insufficient documentation

## 2017-10-06 DIAGNOSIS — K6389 Other specified diseases of intestine: Secondary | ICD-10-CM

## 2017-10-06 DIAGNOSIS — Z5112 Encounter for antineoplastic immunotherapy: Secondary | ICD-10-CM | POA: Diagnosis not present

## 2017-10-06 DIAGNOSIS — Z95828 Presence of other vascular implants and grafts: Secondary | ICD-10-CM

## 2017-10-06 LAB — CBC WITH DIFFERENTIAL (CANCER CENTER ONLY)
BASOS PCT: 0 %
Basophils Absolute: 0 10*3/uL (ref 0.0–0.1)
EOS ABS: 0.1 10*3/uL (ref 0.0–0.5)
EOS PCT: 2 %
HCT: 40.1 % (ref 34.8–46.6)
HEMOGLOBIN: 13.2 g/dL (ref 11.6–15.9)
Lymphocytes Relative: 28 %
Lymphs Abs: 1.4 10*3/uL (ref 0.9–3.3)
MCH: 32.6 pg (ref 25.1–34.0)
MCHC: 32.9 g/dL (ref 31.5–36.0)
MCV: 99 fL (ref 79.5–101.0)
MONO ABS: 0.6 10*3/uL (ref 0.1–0.9)
MONOS PCT: 11 %
Neutro Abs: 2.8 10*3/uL (ref 1.5–6.5)
Neutrophils Relative %: 59 %
PLATELETS: 244 10*3/uL (ref 145–400)
RBC: 4.05 MIL/uL (ref 3.70–5.45)
RDW: 15.5 % — AB (ref 11.2–14.5)
WBC Count: 4.9 10*3/uL (ref 3.9–10.3)

## 2017-10-06 LAB — CEA (IN HOUSE-CHCC): CEA (CHCC-In House): 5.04 ng/mL — ABNORMAL HIGH (ref 0.00–5.00)

## 2017-10-06 LAB — CMP (CANCER CENTER ONLY)
ALBUMIN: 3.7 g/dL (ref 3.5–5.0)
ALT: 11 U/L (ref 0–55)
AST: 18 U/L (ref 5–34)
Alkaline Phosphatase: 69 U/L (ref 40–150)
Anion gap: 6 (ref 3–11)
BUN: 15 mg/dL (ref 7–26)
CHLORIDE: 108 mmol/L (ref 98–109)
CO2: 25 mmol/L (ref 22–29)
Calcium: 9.9 mg/dL (ref 8.4–10.4)
Creatinine: 1 mg/dL (ref 0.60–1.10)
GFR, Estimated: 57 mL/min — ABNORMAL LOW (ref >60)
GLUCOSE: 103 mg/dL (ref 70–140)
POTASSIUM: 4 mmol/L (ref 3.5–5.1)
SODIUM: 139 mmol/L (ref 136–145)
Total Bilirubin: 0.7 mg/dL (ref 0.2–1.2)
Total Protein: 7.7 g/dL (ref 6.4–8.3)

## 2017-10-06 MED ORDER — LORAZEPAM 1 MG PO TABS
ORAL_TABLET | ORAL | Status: AC
Start: 2017-10-06 — End: 2017-10-06
  Filled 2017-10-06: qty 1

## 2017-10-06 MED ORDER — FLUOROURACIL CHEMO INJECTION 2.5 GM/50ML
400.0000 mg/m2 | Freq: Once | INTRAVENOUS | Status: AC
  Administered 2017-10-06: 750 mg via INTRAVENOUS
  Filled 2017-10-06: qty 15

## 2017-10-06 MED ORDER — SODIUM CHLORIDE 0.9% FLUSH
10.0000 mL | INTRAVENOUS | Status: DC | PRN
  Filled 2017-10-06: qty 10

## 2017-10-06 MED ORDER — LORAZEPAM 1 MG PO TABS
1.0000 mg | ORAL_TABLET | Freq: Once | ORAL | Status: AC
  Administered 2017-10-06: 1 mg via ORAL

## 2017-10-06 MED ORDER — SODIUM CHLORIDE 0.9 % IV SOLN
5.0000 mg/kg | Freq: Once | INTRAVENOUS | Status: AC
  Administered 2017-10-06: 400 mg via INTRAVENOUS
  Filled 2017-10-06: qty 16

## 2017-10-06 MED ORDER — PALONOSETRON HCL INJECTION 0.25 MG/5ML
0.2500 mg | Freq: Once | INTRAVENOUS | Status: AC
  Administered 2017-10-06: 0.25 mg via INTRAVENOUS

## 2017-10-06 MED ORDER — DEXAMETHASONE SODIUM PHOSPHATE 10 MG/ML IJ SOLN
10.0000 mg | Freq: Once | INTRAMUSCULAR | Status: AC
  Administered 2017-10-06: 10 mg via INTRAVENOUS

## 2017-10-06 MED ORDER — LEUCOVORIN CALCIUM INJECTION 350 MG
400.0000 mg/m2 | Freq: Once | INTRAVENOUS | Status: AC
  Administered 2017-10-06: 752 mg via INTRAVENOUS
  Filled 2017-10-06: qty 37.6

## 2017-10-06 MED ORDER — SODIUM CHLORIDE 0.9 % IV SOLN
2400.0000 mg/m2 | INTRAVENOUS | Status: DC
  Administered 2017-10-06: 4500 mg via INTRAVENOUS
  Filled 2017-10-06: qty 90

## 2017-10-06 MED ORDER — HEPARIN SOD (PORK) LOCK FLUSH 100 UNIT/ML IV SOLN
500.0000 [IU] | Freq: Once | INTRAVENOUS | Status: DC | PRN
  Filled 2017-10-06: qty 5

## 2017-10-06 MED ORDER — SODIUM CHLORIDE 0.9 % IJ SOLN
10.0000 mL | INTRAMUSCULAR | Status: DC | PRN
  Administered 2017-10-06: 10 mL via INTRAVENOUS
  Filled 2017-10-06: qty 10

## 2017-10-06 MED ORDER — DEXAMETHASONE SODIUM PHOSPHATE 10 MG/ML IJ SOLN
INTRAMUSCULAR | Status: AC
  Filled 2017-10-06: qty 1

## 2017-10-06 MED ORDER — PALONOSETRON HCL INJECTION 0.25 MG/5ML
INTRAVENOUS | Status: AC
  Filled 2017-10-06: qty 5

## 2017-10-06 MED ORDER — SODIUM CHLORIDE 0.9 % IV SOLN
Freq: Once | INTRAVENOUS | Status: AC
  Administered 2017-10-06: 09:00:00 via INTRAVENOUS

## 2017-10-06 NOTE — Progress Notes (Signed)
Hematology and Oncology Follow Up Visit  Margaret Elliott [id number] 1950/08/13    10/06/17   Principle Diagnosis: 67 year old woman with colon cancer diagnosed in June of 2014. She presented with T3 N1 disease and subsequently developed stage IV disease with peritoneal metastasis.  Her tumor is microsatellite stable with NRAS mutation.   Prior Therapy:  She is status post laparoscopic laparotomy right hemicolectomy with ileocolonic anastomosis done on December 09, 2012.   FOLFOX and a Avastin chemotherapy started 01/18/2013. She is S/P 12 cycles completed in 05/2013.   She is a status post microwave ablation of metastatic lesion in the posterior segment of the right lobe of the liver completed on 09/28/2014 and repeated on 11/30/2014.  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  FOLFIRI and a Avastin salvage therapy started on 12/10/2015.  Current therapy:     Maintenance 5-FU, leucovorin and a Avastin every 2 weeks.   Interim History:  Margaret Elliott presents today for a follow-up visit.  She reports no major changes in her health and tolerated the last cycle of chemotherapy without new complications.  She reports mild nausea but no vomiting.  She continues to have discoloration of her palms and soles and skin dryness.  She continues to use moisturizers to ensure appropriate skin care.  She is able to attend to activities of daily living including helping her husband who is still suffering from an injury.  She is able to eat and maintain her weight reasonably well.  She did develop an episode of abdominal pain that lasted for 24 hours and have subsided at this time.  She is reporting left-sided shoulder pain with mobility but none at rest.  She reports no headaches, blurry vision, syncope or new neurological complaints.  She does not report any fevers, chills or sweats.  Without changes in her appetite or weight.  She  does not report any chest pain, palpitation or orthopnea. She does not report any wheezing, hemoptysis or dyspnea on exertion.  She denies any nausea, vomiting, constipation or diarrhea.  He denies any hematochezia or melena.  She does not report any frequency urgency or hesitancy. Does not report any easy bruising or petechiae.  She denies any anxiety or depression.  She does not report any arthralgias or myalgias.  She does not report any lymphadenopathy or petechiae.  Rest of her review of systems is negative.   Medications: I have reviewed the patient's current medications.  Current Outpatient Prescriptions  Medication Sig Dispense Refill  . acetaminophen (TYLENOL) 500 MG tablet Take 500 mg by mouth every 6 (six) hours as needed for pain.      . diphenhydrAMINE (BENADRYL) 25 mg capsule Take 25-50 mg by mouth daily as needed for itching. For allergic reaction      . lidocaine-prilocaine (EMLA) cream Apply 1 application topically as needed. Apply approx 1/2 tsp to skin over port, prior to chemotherapy treatments  1 kit  3  . Multiple Vitamin (MULTIVITAMIN) tablet Take 1 tablet by mouth daily.      . ondansetron (ZOFRAN) 8 MG tablet Take 1 tablet (8 mg total) by mouth every 8 (eight) hours as needed for nausea.  30 tablet  1   No current facility-administered medications for this visit.     Allergies: No Known Allergies  Past Medical History, Surgical history, Social history, and Family History reviewed today and remain unchanged.    Physical Exam:  Blood  pressure 128/85, pulse 84, temperature 98.2 F (36.8 C), temperature source Oral, resp. rate 18, height '5\' 4"'$  (1.626 m), weight 157 lb 12.8 oz (71.6 kg), SpO2 100 %.    ECOG: 0 General appearance: Alert, awake woman without distress. Head: Atraumatic without abnormalities. Oropharynx: Without any thrush or ulcers. Eyes: Pupils are equal and round reactive to light. Lymph nodes: No lymphadenopathy palpated in the cervical, axillary  and inguinal nods Heart: Regular rate and rhythm without murmurs or gallops.  Lung: Clear in all lung fields without any rhonchi, wheezes or dullness to percussion. Abdomen: Soft, nontender without any shifting dullness or ascites. Neurological: No motor or sensory deficits. Skin: Mild discoloration noted in her palms but no desquamation or skin cracking. Musculoskeletal: Full range of motion all extremities.  Left shoulder tenderness noted with full range of motion intact.  Clicking was noted on exam. Psychiatric: Appropriate mood and affect without any stress or anxiety.  CBC    Component Value Date/Time   WBC 4.2 09/08/2017 0834   RBC 3.63 (L) 09/08/2017 0834   HGB 12.0 09/08/2017 0834   HGB 12.3 05/26/2017 0809   HCT 35.7 09/08/2017 0834   HCT 37.6 05/26/2017 0809   PLT 186 09/08/2017 0834   PLT 180 05/26/2017 0809   MCV 98.1 09/08/2017 0834   MCV 100.3 05/26/2017 0809   MCH 32.9 09/08/2017 0834   MCHC 33.5 09/08/2017 0834   RDW 15.6 (H) 09/08/2017 0834   RDW 17.4 (H) 05/26/2017 0809   LYMPHSABS 1.0 09/08/2017 0834   LYMPHSABS 1.2 05/26/2017 0809   MONOABS 0.4 09/08/2017 0834   MONOABS 0.5 05/26/2017 0809   EOSABS 0.1 09/08/2017 0834   EOSABS 0.1 05/26/2017 0809   BASOSABS 0.0 09/08/2017 0834   BASOSABS 0.0 05/26/2017 0809       Impression and Plan:  67 year old woman with:   1.  Stage IV colon cancer diagnosed in 2014 at that time she had a T3 N1 disease and subsequently developed peritoneal metastasis.  She is status post a treatment outlined above.  She continues to tolerate maintenance chemotherapy with 5-FU, leucovorin and a Avastin without complications.  Her tumor markers and imaging studies showed a very little residual disease.  Risks and benefits of continuing this approach was reviewed again she is agreeable to continue although he is desiring a treatment break.  The plan is to proceed with maintenance therapy in 4 weeks based on her wishes.   2.  Intravenous access.  She reports no complaints related to Port-A-Cath use.  3. 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  4.  Nausea: Related to chemotherapy and continues to be mild and manageable with Compazine.  5.  Left shoulder pain: Referral for orthopedic surgery requested by the patient and will make the appropriate referral for her.  6.  Prognosis: Her disease is incurable but has been managed and treated adequately for many years.  Her performance status is excellent and aggressive therapy continue to be warranted.  7. Followup: Will be in 4 weeks for the next cycle of maintenance chemotherapy.  15 minutes spent today face-to-face with the patient.  More than 50% of today's visit was spent on counseling, education and coordination of her multifaceted care.   Zola Button MD 10/06/17

## 2017-10-06 NOTE — Addendum Note (Signed)
Addended by: Wyatt Portela on: 10/06/2017 09:24 AM   Modules accepted: Orders

## 2017-10-06 NOTE — Telephone Encounter (Signed)
Gave patient avs report and appointments for April and May. Patient will take a break from chemo and next appointment for chemo/pump will be 5/22 and 5/24. Confirmed with FS.  Gave patient appointment with [facility] 10/12/2017 @ 10:30 am with Dr. Sharol Given [address] [phone]

## 2017-10-06 NOTE — Patient Instructions (Signed)
[place] Discharge Instructions for Patients Receiving Chemotherapy  Today you received the following chemotherapy agents leucovorin, avastin, and 5FU  To help prevent nausea and vomiting after your treatment, we encourage you to take your nausea medication as directed   If you develop nausea and vomiting that is not controlled by your nausea medication, call the clinic.   BELOW ARE SYMPTOMS THAT SHOULD BE REPORTED IMMEDIATELY:  *FEVER GREATER THAN 100.5 F  *CHILLS WITH OR WITHOUT FEVER  NAUSEA AND VOMITING THAT IS NOT CONTROLLED WITH YOUR NAUSEA MEDICATION  *UNUSUAL SHORTNESS OF BREATH  *UNUSUAL BRUISING OR BLEEDING  TENDERNESS IN MOUTH AND THROAT WITH OR WITHOUT PRESENCE OF ULCERS  *URINARY PROBLEMS  *BOWEL PROBLEMS  UNUSUAL RASH Items with * indicate a potential emergency and should be followed up as soon as possible.  Feel free to call the clinic should you have any questions or concerns. The clinic phone number is [phone].  Please show the [place] at check-in to the Emergency Department and triage nurse.

## 2017-10-06 NOTE — Telephone Encounter (Signed)
Fax cover sheet with copy of referral sent to HIM to send referral w/records to Dr. Sharol Given.

## 2017-10-07 ENCOUNTER — Telehealth: Payer: Self-pay | Admitting: *Deleted

## 2017-10-07 NOTE — Telephone Encounter (Signed)
Faxed ROI to [employer], attn: Dr. Sharol Given; release [phone]

## 2017-10-08 ENCOUNTER — Inpatient Hospital Stay: Payer: Medicare Other

## 2017-10-08 DIAGNOSIS — C180 Malignant neoplasm of cecum: Secondary | ICD-10-CM | POA: Diagnosis not present

## 2017-10-08 DIAGNOSIS — Z452 Encounter for adjustment and management of vascular access device: Secondary | ICD-10-CM | POA: Diagnosis not present

## 2017-10-08 DIAGNOSIS — Z95828 Presence of other vascular implants and grafts: Secondary | ICD-10-CM

## 2017-10-08 DIAGNOSIS — T451X5S Adverse effect of antineoplastic and immunosuppressive drugs, sequela: Secondary | ICD-10-CM | POA: Diagnosis not present

## 2017-10-08 DIAGNOSIS — Z5112 Encounter for antineoplastic immunotherapy: Secondary | ICD-10-CM | POA: Diagnosis not present

## 2017-10-08 DIAGNOSIS — C786 Secondary malignant neoplasm of retroperitoneum and peritoneum: Secondary | ICD-10-CM | POA: Diagnosis not present

## 2017-10-08 DIAGNOSIS — G620 Drug-induced polyneuropathy: Secondary | ICD-10-CM | POA: Diagnosis not present

## 2017-10-08 MED ORDER — SODIUM CHLORIDE 0.9 % IJ SOLN
10.0000 mL | INTRAMUSCULAR | Status: DC | PRN
  Administered 2017-10-08: 10 mL via INTRAVENOUS
  Filled 2017-10-08: qty 10

## 2017-10-08 MED ORDER — HEPARIN SOD (PORK) LOCK FLUSH 100 UNIT/ML IV SOLN
500.0000 [IU] | Freq: Once | INTRAVENOUS | Status: AC | PRN
  Administered 2017-10-08: 500 [IU] via INTRAVENOUS
  Filled 2017-10-08: qty 5

## 2017-10-08 NOTE — Patient Instructions (Signed)

## 2017-10-12 ENCOUNTER — Ambulatory Visit (INDEPENDENT_AMBULATORY_CARE_PROVIDER_SITE_OTHER): Payer: Self-pay | Admitting: Orthopedic Surgery

## 2017-11-10 ENCOUNTER — Inpatient Hospital Stay: Payer: Medicare Other

## 2017-11-10 ENCOUNTER — Inpatient Hospital Stay: Payer: Medicare Other | Attending: Oncology | Admitting: Oncology

## 2017-11-10 ENCOUNTER — Telehealth: Payer: Self-pay | Admitting: Oncology

## 2017-11-10 VITALS — BP 138/87 | HR 72 | Resp 18

## 2017-11-10 VITALS — BP 154/91 | HR 78 | Temp 98.0°F | Resp 18 | Ht 64.0 in | Wt 154.4 lb

## 2017-11-10 DIAGNOSIS — C180 Malignant neoplasm of cecum: Secondary | ICD-10-CM | POA: Diagnosis not present

## 2017-11-10 DIAGNOSIS — Z5111 Encounter for antineoplastic chemotherapy: Secondary | ICD-10-CM | POA: Insufficient documentation

## 2017-11-10 DIAGNOSIS — K6389 Other specified diseases of intestine: Secondary | ICD-10-CM

## 2017-11-10 DIAGNOSIS — R110 Nausea: Secondary | ICD-10-CM

## 2017-11-10 DIAGNOSIS — K769 Liver disease, unspecified: Secondary | ICD-10-CM

## 2017-11-10 DIAGNOSIS — C786 Secondary malignant neoplasm of retroperitoneum and peritoneum: Secondary | ICD-10-CM | POA: Insufficient documentation

## 2017-11-10 DIAGNOSIS — Z5112 Encounter for antineoplastic immunotherapy: Secondary | ICD-10-CM | POA: Insufficient documentation

## 2017-11-10 DIAGNOSIS — G629 Polyneuropathy, unspecified: Secondary | ICD-10-CM | POA: Diagnosis not present

## 2017-11-10 DIAGNOSIS — C189 Malignant neoplasm of colon, unspecified: Secondary | ICD-10-CM

## 2017-11-10 DIAGNOSIS — Z79899 Other long term (current) drug therapy: Secondary | ICD-10-CM | POA: Insufficient documentation

## 2017-11-10 DIAGNOSIS — M25512 Pain in left shoulder: Secondary | ICD-10-CM | POA: Insufficient documentation

## 2017-11-10 DIAGNOSIS — Z452 Encounter for adjustment and management of vascular access device: Secondary | ICD-10-CM | POA: Diagnosis not present

## 2017-11-10 DIAGNOSIS — C787 Secondary malignant neoplasm of liver and intrahepatic bile duct: Principal | ICD-10-CM

## 2017-11-10 DIAGNOSIS — Z95828 Presence of other vascular implants and grafts: Secondary | ICD-10-CM

## 2017-11-10 LAB — CMP (CANCER CENTER ONLY)
ALK PHOS: 67 U/L (ref 40–150)
ALT: 13 U/L (ref 0–55)
ANION GAP: 8 (ref 3–11)
AST: 19 U/L (ref 5–34)
Albumin: 3.7 g/dL (ref 3.5–5.0)
BILIRUBIN TOTAL: 0.8 mg/dL (ref 0.2–1.2)
BUN: 21 mg/dL (ref 7–26)
CALCIUM: 9.8 mg/dL (ref 8.4–10.4)
CO2: 23 mmol/L (ref 22–29)
CREATININE: 0.96 mg/dL (ref 0.60–1.10)
Chloride: 109 mmol/L (ref 98–109)
GFR, EST NON AFRICAN AMERICAN: 60 mL/min — AB (ref >60)
Glucose, Bld: 92 mg/dL (ref 70–140)
Potassium: 4.3 mmol/L (ref 3.5–5.1)
SODIUM: 140 mmol/L (ref 136–145)
TOTAL PROTEIN: 7.8 g/dL (ref 6.4–8.3)

## 2017-11-10 LAB — CBC WITH DIFFERENTIAL (CANCER CENTER ONLY)
Basophils Absolute: 0.1 10*3/uL (ref 0.0–0.1)
Basophils Relative: 2 %
EOS ABS: 0.1 10*3/uL (ref 0.0–0.5)
Eosinophils Relative: 2 %
HEMATOCRIT: 40.8 % (ref 34.8–46.6)
HEMOGLOBIN: 13.4 g/dL (ref 11.6–15.9)
LYMPHS ABS: 1.4 10*3/uL (ref 0.9–3.3)
LYMPHS PCT: 33 %
MCH: 32.1 pg (ref 25.1–34.0)
MCHC: 32.8 g/dL (ref 31.5–36.0)
MCV: 97.9 fL (ref 79.5–101.0)
MONOS PCT: 7 %
Monocytes Absolute: 0.3 10*3/uL (ref 0.1–0.9)
NEUTROS PCT: 56 %
Neutro Abs: 2.3 10*3/uL (ref 1.5–6.5)
Platelet Count: 215 10*3/uL (ref 145–400)
RBC: 4.17 MIL/uL (ref 3.70–5.45)
RDW: 15.8 % — ABNORMAL HIGH (ref 11.2–14.5)
WBC Count: 4.2 10*3/uL (ref 3.9–10.3)

## 2017-11-10 LAB — CEA (IN HOUSE-CHCC): CEA (CHCC-IN HOUSE): 5.12 ng/mL — AB (ref 0.00–5.00)

## 2017-11-10 MED ORDER — DEXAMETHASONE SODIUM PHOSPHATE 10 MG/ML IJ SOLN
10.0000 mg | Freq: Once | INTRAMUSCULAR | Status: AC
  Administered 2017-11-10: 10 mg via INTRAVENOUS

## 2017-11-10 MED ORDER — SODIUM CHLORIDE 0.9 % IV SOLN
5.0000 mg/kg | Freq: Once | INTRAVENOUS | Status: AC
  Administered 2017-11-10: 400 mg via INTRAVENOUS
  Filled 2017-11-10: qty 16

## 2017-11-10 MED ORDER — DEXAMETHASONE SODIUM PHOSPHATE 10 MG/ML IJ SOLN
INTRAMUSCULAR | Status: AC
  Filled 2017-11-10: qty 1

## 2017-11-10 MED ORDER — SODIUM CHLORIDE 0.9 % IV SOLN
Freq: Once | INTRAVENOUS | Status: AC
  Administered 2017-11-10: 12:00:00 via INTRAVENOUS

## 2017-11-10 MED ORDER — DEXTROSE 5 % IV SOLN
400.0000 mg/m2 | Freq: Once | INTRAVENOUS | Status: AC
  Administered 2017-11-10: 752 mg via INTRAVENOUS
  Filled 2017-11-10: qty 37.6

## 2017-11-10 MED ORDER — HYDROCODONE-ACETAMINOPHEN 5-325 MG PO TABS: 1.0000 | ORAL_TABLET | ORAL | 0 refills | Status: DC | PRN

## 2017-11-10 MED ORDER — LORAZEPAM 1 MG PO TABS
ORAL_TABLET | ORAL | Status: AC
  Filled 2017-11-10: qty 1

## 2017-11-10 MED ORDER — FLUOROURACIL CHEMO INJECTION 2.5 GM/50ML
400.0000 mg/m2 | Freq: Once | INTRAVENOUS | Status: AC
  Administered 2017-11-10: 750 mg via INTRAVENOUS
  Filled 2017-11-10: qty 15

## 2017-11-10 MED ORDER — SODIUM CHLORIDE 0.9 % IJ SOLN
10.0000 mL | INTRAMUSCULAR | Status: DC | PRN
  Administered 2017-11-10: 10 mL via INTRAVENOUS
  Filled 2017-11-10: qty 10

## 2017-11-10 MED ORDER — FLUOROURACIL CHEMO INJECTION 5 GM/100ML
2400.0000 mg/m2 | INTRAVENOUS | Status: DC
  Administered 2017-11-10: 4500 mg via INTRAVENOUS
  Filled 2017-11-10: qty 90

## 2017-11-10 MED ORDER — PALONOSETRON HCL INJECTION 0.25 MG/5ML
INTRAVENOUS | Status: AC
  Filled 2017-11-10: qty 5

## 2017-11-10 MED ORDER — LORAZEPAM 1 MG PO TABS
1.0000 mg | ORAL_TABLET | Freq: Once | ORAL | Status: AC
  Administered 2017-11-10: 1 mg via ORAL

## 2017-11-10 MED ORDER — PALONOSETRON HCL INJECTION 0.25 MG/5ML
0.2500 mg | Freq: Once | INTRAVENOUS | Status: AC
  Administered 2017-11-10: 0.25 mg via INTRAVENOUS

## 2017-11-10 NOTE — Patient Instructions (Signed)
Implanted Port Home Guide An implanted port is a type of central line that is placed under the skin. Central lines are used to provide IV access when treatment or nutrition needs to be given through a person's veins. Implanted ports are used for long-term IV access. An implanted port may be placed because:  You need IV medicine that would be irritating to the small veins in your hands or arms.  You need long-term IV medicines, such as antibiotics.  You need IV nutrition for a long period.  You need frequent blood draws for lab tests.  You need dialysis.  Implanted ports are usually placed in the chest area, but they can also be placed in the upper arm, the abdomen, or the leg. An implanted port has two main parts:  Reservoir. The reservoir is round and will appear as a small, raised area under your skin. The reservoir is the part where a needle is inserted to give medicines or draw blood.  Catheter. The catheter is a thin, flexible tube that extends from the reservoir. The catheter is placed into a large vein. Medicine that is inserted into the reservoir goes into the catheter and then into the vein.  How will I care for my incision site? Do not get the incision site wet. Bathe or shower as directed by your health care provider. How is my port accessed? Special steps must be taken to access the port:  Before the port is accessed, a numbing cream can be placed on the skin. This helps numb the skin over the port site.  Your health care provider uses a sterile technique to access the port. ? Your health care provider must put on a mask and sterile gloves. ? The skin over your port is cleaned carefully with an antiseptic and allowed to dry. ? The port is gently pinched between sterile gloves, and a needle is inserted into the port.  Only "non-coring" port needles should be used to access the port. Once the port is accessed, a blood return should be checked. This helps ensure that the port  is in the vein and is not clogged.  If your port needs to remain accessed for a constant infusion, a clear (transparent) bandage will be placed over the needle site. The bandage and needle will need to be changed every week, or as directed by your health care provider.  Keep the bandage covering the needle clean and dry. Do not get it wet. Follow your health care provider's instructions on how to take a shower or bath while the port is accessed.  If your port does not need to stay accessed, no bandage is needed over the port.  What is flushing? Flushing helps keep the port from getting clogged. Follow your health care provider's instructions on how and when to flush the port. Ports are usually flushed with saline solution or a medicine called heparin. The need for flushing will depend on how the port is used.  If the port is used for intermittent medicines or blood draws, the port will need to be flushed: ? After medicines have been given. ? After blood has been drawn. ? As part of routine maintenance.  If a constant infusion is running, the port may not need to be flushed.  How long will my port stay implanted? The port can stay in for as long as your health care provider thinks it is needed. When it is time for the port to come out, surgery will be   done to remove it. The procedure is similar to the one performed when the port was put in. When should I seek immediate medical care? When you have an implanted port, you should seek immediate medical care if:  You notice a bad smell coming from the incision site.  You have swelling, redness, or drainage at the incision site.  You have more swelling or pain at the port site or the surrounding area.  You have a fever that is not controlled with medicine.  This information is not intended to replace advice given to you by your health care provider. Make sure you discuss any questions you have with your health care provider. Document  Released: 06/08/2005 Document Revised: 11/14/2015 Document Reviewed: 02/13/2013 Elsevier Interactive Patient Education  2017 [employer]  

## 2017-11-10 NOTE — Patient Instructions (Signed)
[facility] Discharge Instructions for Patients Receiving Chemotherapy  Today you received the following chemotherapy agents leucovorin, avastin, and 5FU  To help prevent nausea and vomiting after your treatment, we encourage you to take your nausea medication as directed   If you develop nausea and vomiting that is not controlled by your nausea medication, call the clinic.   BELOW ARE SYMPTOMS THAT SHOULD BE REPORTED IMMEDIATELY:  *FEVER GREATER THAN 100.5 F  *CHILLS WITH OR WITHOUT FEVER  NAUSEA AND VOMITING THAT IS NOT CONTROLLED WITH YOUR NAUSEA MEDICATION  *UNUSUAL SHORTNESS OF BREATH  *UNUSUAL BRUISING OR BLEEDING  TENDERNESS IN MOUTH AND THROAT WITH OR WITHOUT PRESENCE OF ULCERS  *URINARY PROBLEMS  *BOWEL PROBLEMS  UNUSUAL RASH Items with * indicate a potential emergency and should be followed up as soon as possible.  Feel free to call the clinic should you have any questions or concerns. The clinic phone number is [phone].  Please show the [facility] at check-in to the Emergency Department and triage nurse.

## 2017-11-10 NOTE — Telephone Encounter (Signed)
Appointments scheduled AVS/Calendar printed / contrast material proved w/ instructions per 5/22 los

## 2017-11-10 NOTE — Progress Notes (Signed)
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  She was initially diagnosed in June of 2014 with T3 N1 disease and.  Her tumor is microsatellite stable with NRAS mutation.   Prior Therapy:  She is status post laparoscopic laparotomy right hemicolectomy with ileocolonic anastomosis done on December 09, 2012.   FOLFOX and a Avastin chemotherapy started 01/18/2013. She is S/P 12 cycles completed in 05/2013.   She is a status post microwave ablation of metastatic lesion in the posterior segment of the right lobe of the liver completed on 09/28/2014 and repeated on 11/30/2014.  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  FOLFIRI and a Avastin salvage therapy started on 12/10/2015.  Current therapy:     Maintenance 5-FU, leucovorin and a Avastin every 2 weeks.   Interim History:  Ms. Nembhard is here for a follow-up.  Since the last visit, she reports no major changes in her health.  She did receive a treatment break 2 weeks ago and ready to resume chemotherapy today.  She was able to incorporate reasonably well with resolution of her side effects associated with chemotherapy.  She feels her skin has improved with no skin cracking or dryness.  Her energy is improved and her nausea is resolved.  Is been able to eat well and attends to activities of daily living.  She is also able to take care of her husband who is suffering from an injury.  She continues to have left-sided shoulder pain and she had to reschedule her orthopedic appointment.  She does take hydrocodone for her pain which is manageable.   She reports no headaches, blurry vision, syncope, syncope or seizures.  She does not report any fevers, chills or sweats.  She does not report any chest pain, palpitation or orthopnea. She  does not report any wheezing, hemoptysis or dyspnea on exertion.  She denies any nausea, vomiting, constipation or diarrhea.  He denies any hematochezia or melena.  She does not report any frequency urgency or hesitancy. Does not report any easy bruising or petechiae.  She denies any mood changes.  She does not report any arthralgias or myalgias.  She does not report any lymphadenopathy or petechiae.  Rest of her review of systems is negative.   Medications: I have reviewed the patient's current medications.  Current Outpatient Prescriptions  Medication Sig Dispense Refill  . acetaminophen (TYLENOL) 500 MG tablet Take 500 mg by mouth every 6 (six) hours as needed for pain.      . diphenhydrAMINE (BENADRYL) 25 mg capsule Take 25-50 mg by mouth daily as needed for itching. For allergic reaction      . lidocaine-prilocaine (EMLA) cream Apply 1 application topically as needed. Apply approx 1/2 tsp to skin over port, prior to chemotherapy treatments  1 kit  3  . Multiple Vitamin (MULTIVITAMIN) tablet Take 1 tablet by mouth daily.      . ondansetron (ZOFRAN) 8 MG tablet Take 1 tablet (8 mg total) by mouth every 8 (eight) hours as needed for nausea.  30 tablet  1   No current facility-administered medications for this visit.     Allergies: No Known Allergies  Past Medical History, Surgical history, Social history, and Family History reviewed today and remain unchanged.    Physical Exam: Blood pressure (!) 154/91, pulse  78, temperature 98 F (36.7 C), temperature source Oral, resp. rate 18, height '5\' 4"'$  (1.626 m), weight 154 lb 6.4 oz (70 kg), SpO2 100 %.    ECOG: 0 General appearance: Well-appearing woman without distress. Head: Cephalic without abnormalities. Oropharynx: No thrush or ulcers. Eyes: Sclera anicteric. Lymph nodes: No cervical, axillary, supraclavicular and inguinal lymphadenopathy palpated. Heart: Regular rate without any murmurs.  S1 and S2. Lung: Clear without any  rhonchi, wheezes or dullness to percussion. Abdomen: Soft, without any rebound or guarding.  Good bowel sounds without any shifting dullness or ascites. Neurological: No no deficits noted. Skin: Moist without any lesions or rashes. Musculoskeletal: No clubbing or cyanosis.  Limited range of motion her left shoulder. Psychiatric: Mood appeared appropriate.  CBC    Component Value Date/Time   WBC 4.9 10/06/2017 0810   WBC 4.2 09/08/2017 0834   RBC 4.05 10/06/2017 0810   HGB 13.2 10/06/2017 0810   HGB 12.3 05/26/2017 0809   HCT 40.1 10/06/2017 0810   HCT 37.6 05/26/2017 0809   PLT 244 10/06/2017 0810   PLT 180 05/26/2017 0809   MCV 99.0 10/06/2017 0810   MCV 100.3 05/26/2017 0809   MCH 32.6 10/06/2017 0810   MCHC 32.9 10/06/2017 0810   RDW 15.5 (H) 10/06/2017 0810   RDW 17.4 (H) 05/26/2017 0809   LYMPHSABS 1.4 10/06/2017 0810   LYMPHSABS 1.2 05/26/2017 0809   MONOABS 0.6 10/06/2017 0810   MONOABS 0.5 05/26/2017 0809   EOSABS 0.1 10/06/2017 0810   EOSABS 0.1 05/26/2017 0809   BASOSABS 0.0 10/06/2017 0810   BASOSABS 0.0 05/26/2017 0809       Impression and Plan:  67 year old woman with:   1.  T3N1 colon cancer diagnosed in 2014 and subsequently developed peritoneal metastasis.  She is status post the above mentioned treatment.  She has been receiving maintenance 5-FU, leucovorin and a Avastin without complications.  Risks and benefits of continuing this approach was reviewed today and she is agreeable to continue.  She would like to increase the interval to every 4 weeks for better tolerance.  We will proceed with this approach and I will stage her with a CT scan in June 2019.  Different salvage therapy may be needed if she develops progression of disease.   2. Intravenous access.  Port-A-Cath remains in use without complications.  3. Neuropathy: No worsening neuropathy noted on exam.  We will continue to monitor.  4.  Nausea: Compazine has been able to manage her nausea  reasonably well.  5.  Left shoulder pain: She had to reschedule her orthopedic appointment but referral is available to her.  6.  Prognosis: She has an excellent performance status and despite incurable malignancy, aggressive therapy is warranted..  7. Followup: Will be in 4 weeks for evaluation after CT scan and potentially resuming maintenance chemotherapy.  15 minutes spent today face-to-face with the patient.  More than 50% of today's visit was spent on counseling, education and answering questions regarding future plan of care.   Zola Button MD 11/10/17

## 2017-11-12 ENCOUNTER — Inpatient Hospital Stay: Payer: Medicare Other

## 2017-11-12 ENCOUNTER — Inpatient Hospital Stay (HOSPITAL_BASED_OUTPATIENT_CLINIC_OR_DEPARTMENT_OTHER): Payer: Medicare Other

## 2017-11-12 VITALS — BP 132/84 | HR 80 | Temp 98.1°F | Resp 18

## 2017-11-12 DIAGNOSIS — C180 Malignant neoplasm of cecum: Secondary | ICD-10-CM | POA: Diagnosis not present

## 2017-11-12 DIAGNOSIS — K6389 Other specified diseases of intestine: Secondary | ICD-10-CM

## 2017-11-12 DIAGNOSIS — C189 Malignant neoplasm of colon, unspecified: Secondary | ICD-10-CM

## 2017-11-12 DIAGNOSIS — R110 Nausea: Secondary | ICD-10-CM | POA: Diagnosis not present

## 2017-11-12 DIAGNOSIS — Z452 Encounter for adjustment and management of vascular access device: Secondary | ICD-10-CM | POA: Diagnosis not present

## 2017-11-12 DIAGNOSIS — C786 Secondary malignant neoplasm of retroperitoneum and peritoneum: Secondary | ICD-10-CM | POA: Diagnosis not present

## 2017-11-12 DIAGNOSIS — Z5112 Encounter for antineoplastic immunotherapy: Secondary | ICD-10-CM | POA: Diagnosis not present

## 2017-11-12 DIAGNOSIS — K769 Liver disease, unspecified: Secondary | ICD-10-CM

## 2017-11-12 DIAGNOSIS — Z5111 Encounter for antineoplastic chemotherapy: Secondary | ICD-10-CM | POA: Diagnosis not present

## 2017-11-12 MED ORDER — HEPARIN SOD (PORK) LOCK FLUSH 100 UNIT/ML IV SOLN
500.0000 [IU] | Freq: Once | INTRAVENOUS | Status: AC | PRN
  Administered 2017-11-12: 500 [IU]
  Filled 2017-11-12: qty 5

## 2017-11-12 MED ORDER — SODIUM CHLORIDE 0.9% FLUSH
10.0000 mL | INTRAVENOUS | Status: DC | PRN
  Administered 2017-11-12: 10 mL
  Filled 2017-11-12: qty 10

## 2017-12-01 ENCOUNTER — Ambulatory Visit (HOSPITAL_COMMUNITY)
Admission: RE | Admit: 2017-12-01 | Discharge: 2017-12-01 | Disposition: A | Discharge status: Home / Self Care | Payer: Medicare Other | Source: Ambulatory Visit | Attending: Oncology | Admitting: Oncology

## 2017-12-01 ENCOUNTER — Encounter (HOSPITAL_COMMUNITY): Payer: Self-pay

## 2017-12-01 DIAGNOSIS — C189 Malignant neoplasm of colon, unspecified: Secondary | ICD-10-CM | POA: Diagnosis not present

## 2017-12-01 DIAGNOSIS — R918 Other nonspecific abnormal finding of lung field: Secondary | ICD-10-CM | POA: Diagnosis not present

## 2017-12-01 DIAGNOSIS — Z9889 Other specified postprocedural states: Secondary | ICD-10-CM | POA: Insufficient documentation

## 2017-12-01 DIAGNOSIS — R932 Abnormal findings on diagnostic imaging of liver and biliary tract: Secondary | ICD-10-CM | POA: Insufficient documentation

## 2017-12-01 DIAGNOSIS — R911 Solitary pulmonary nodule: Secondary | ICD-10-CM | POA: Diagnosis not present

## 2017-12-01 DIAGNOSIS — I700 Atherosclerosis of aorta: Secondary | ICD-10-CM | POA: Diagnosis not present

## 2017-12-01 DIAGNOSIS — C787 Secondary malignant neoplasm of liver and intrahepatic bile duct: Secondary | ICD-10-CM | POA: Diagnosis not present

## 2017-12-01 MED ORDER — HEPARIN SOD (PORK) LOCK FLUSH 100 UNIT/ML IV SOLN
INTRAVENOUS | Status: AC
  Filled 2017-12-01: qty 5

## 2017-12-01 MED ORDER — HEPARIN SOD (PORK) LOCK FLUSH 100 UNIT/ML IV SOLN
500.0000 [IU] | Freq: Once | INTRAVENOUS | Status: AC
  Administered 2017-12-01: 500 [IU]

## 2017-12-01 MED ORDER — IOPAMIDOL (ISOVUE-300) INJECTION 61%
INTRAVENOUS | Status: AC
  Administered 2017-12-01: 100 mL
  Filled 2017-12-01: qty 100

## 2017-12-08 ENCOUNTER — Inpatient Hospital Stay: Payer: Medicare Other

## 2017-12-08 ENCOUNTER — Telehealth: Payer: Self-pay | Admitting: Oncology

## 2017-12-08 ENCOUNTER — Inpatient Hospital Stay: Payer: Medicare Other | Attending: Oncology

## 2017-12-08 ENCOUNTER — Inpatient Hospital Stay (HOSPITAL_BASED_OUTPATIENT_CLINIC_OR_DEPARTMENT_OTHER): Payer: Medicare Other | Admitting: Oncology

## 2017-12-08 VITALS — BP 158/92 | HR 80 | Temp 97.9°F | Resp 18 | Ht 64.0 in | Wt 156.5 lb

## 2017-12-08 VITALS — BP 121/93

## 2017-12-08 DIAGNOSIS — R5383 Other fatigue: Secondary | ICD-10-CM

## 2017-12-08 DIAGNOSIS — C787 Secondary malignant neoplasm of liver and intrahepatic bile duct: Secondary | ICD-10-CM | POA: Insufficient documentation

## 2017-12-08 DIAGNOSIS — C189 Malignant neoplasm of colon, unspecified: Secondary | ICD-10-CM

## 2017-12-08 DIAGNOSIS — Z5112 Encounter for antineoplastic immunotherapy: Secondary | ICD-10-CM | POA: Insufficient documentation

## 2017-12-08 DIAGNOSIS — Z79899 Other long term (current) drug therapy: Secondary | ICD-10-CM | POA: Insufficient documentation

## 2017-12-08 DIAGNOSIS — Z5111 Encounter for antineoplastic chemotherapy: Secondary | ICD-10-CM | POA: Insufficient documentation

## 2017-12-08 DIAGNOSIS — M25512 Pain in left shoulder: Secondary | ICD-10-CM | POA: Diagnosis not present

## 2017-12-08 DIAGNOSIS — C786 Secondary malignant neoplasm of retroperitoneum and peritoneum: Secondary | ICD-10-CM

## 2017-12-08 DIAGNOSIS — C180 Malignant neoplasm of cecum: Secondary | ICD-10-CM

## 2017-12-08 DIAGNOSIS — Z452 Encounter for adjustment and management of vascular access device: Secondary | ICD-10-CM | POA: Insufficient documentation

## 2017-12-08 DIAGNOSIS — R110 Nausea: Secondary | ICD-10-CM

## 2017-12-08 DIAGNOSIS — C9000 Multiple myeloma not having achieved remission: Secondary | ICD-10-CM

## 2017-12-08 DIAGNOSIS — I700 Atherosclerosis of aorta: Secondary | ICD-10-CM | POA: Diagnosis not present

## 2017-12-08 DIAGNOSIS — G620 Drug-induced polyneuropathy: Secondary | ICD-10-CM | POA: Diagnosis not present

## 2017-12-08 DIAGNOSIS — T451X5S Adverse effect of antineoplastic and immunosuppressive drugs, sequela: Secondary | ICD-10-CM | POA: Diagnosis not present

## 2017-12-08 DIAGNOSIS — R911 Solitary pulmonary nodule: Secondary | ICD-10-CM | POA: Diagnosis not present

## 2017-12-08 DIAGNOSIS — Z95828 Presence of other vascular implants and grafts: Secondary | ICD-10-CM

## 2017-12-08 DIAGNOSIS — K6389 Other specified diseases of intestine: Secondary | ICD-10-CM

## 2017-12-08 DIAGNOSIS — K769 Liver disease, unspecified: Secondary | ICD-10-CM

## 2017-12-08 LAB — CBC WITH DIFFERENTIAL (CANCER CENTER ONLY)
BASOS PCT: 1 %
Basophils Absolute: 0 10*3/uL (ref 0.0–0.1)
EOS ABS: 0.1 10*3/uL (ref 0.0–0.5)
Eosinophils Relative: 1 %
HEMATOCRIT: 40.1 % (ref 34.8–46.6)
HEMOGLOBIN: 13.3 g/dL (ref 11.6–15.9)
LYMPHS ABS: 1.3 10*3/uL (ref 0.9–3.3)
Lymphocytes Relative: 26 %
MCH: 32.3 pg (ref 25.1–34.0)
MCHC: 33.2 g/dL (ref 31.5–36.0)
MCV: 97.4 fL (ref 79.5–101.0)
Monocytes Absolute: 0.4 10*3/uL (ref 0.1–0.9)
Monocytes Relative: 9 %
NEUTROS ABS: 3.2 10*3/uL (ref 1.5–6.5)
NEUTROS PCT: 63 %
Platelet Count: 224 10*3/uL (ref 145–400)
RBC: 4.12 MIL/uL (ref 3.70–5.45)
RDW: 14.6 % — ABNORMAL HIGH (ref 11.2–14.5)
WBC: 5.1 10*3/uL (ref 3.9–10.3)

## 2017-12-08 LAB — CMP (CANCER CENTER ONLY)
ALT: 12 U/L (ref 0–55)
ANION GAP: 7 (ref 3–11)
AST: 18 U/L (ref 5–34)
Albumin: 3.5 g/dL (ref 3.5–5.0)
Alkaline Phosphatase: 83 U/L (ref 40–150)
BUN: 18 mg/dL (ref 7–26)
CALCIUM: 9.8 mg/dL (ref 8.4–10.4)
CHLORIDE: 107 mmol/L (ref 98–109)
CO2: 25 mmol/L (ref 22–29)
CREATININE: 0.97 mg/dL (ref 0.60–1.10)
GFR, EST NON AFRICAN AMERICAN: 59 mL/min — AB (ref >60)
Glucose, Bld: 106 mg/dL (ref 70–140)
Potassium: 4.1 mmol/L (ref 3.5–5.1)
SODIUM: 139 mmol/L (ref 136–145)
Total Bilirubin: 0.5 mg/dL (ref 0.2–1.2)
Total Protein: 7.6 g/dL (ref 6.4–8.3)

## 2017-12-08 LAB — TOTAL PROTEIN, URINE DIPSTICK: PROTEIN: NEGATIVE mg/dL

## 2017-12-08 LAB — CEA (IN HOUSE-CHCC): CEA (CHCC-IN HOUSE): 4.75 ng/mL (ref 0.00–5.00)

## 2017-12-08 MED ORDER — PALONOSETRON HCL INJECTION 0.25 MG/5ML
INTRAVENOUS | Status: AC
  Filled 2017-12-08: qty 5

## 2017-12-08 MED ORDER — SODIUM CHLORIDE 0.9 % IV SOLN
5.0000 mg/kg | Freq: Once | INTRAVENOUS | Status: AC
  Administered 2017-12-08: 400 mg via INTRAVENOUS
  Filled 2017-12-08: qty 16

## 2017-12-08 MED ORDER — LORAZEPAM 1 MG PO TABS
1.0000 mg | ORAL_TABLET | Freq: Once | ORAL | Status: AC
  Administered 2017-12-08: 1 mg via ORAL

## 2017-12-08 MED ORDER — LEUCOVORIN CALCIUM INJECTION 350 MG
400.0000 mg/m2 | Freq: Once | INTRAVENOUS | Status: AC
  Administered 2017-12-08: 752 mg via INTRAVENOUS
  Filled 2017-12-08: qty 37.6

## 2017-12-08 MED ORDER — SODIUM CHLORIDE 0.9 % IJ SOLN
10.0000 mL | INTRAMUSCULAR | Status: DC | PRN
  Administered 2017-12-08: 10 mL via INTRAVENOUS
  Filled 2017-12-08: qty 10

## 2017-12-08 MED ORDER — FLUOROURACIL CHEMO INJECTION 2.5 GM/50ML
400.0000 mg/m2 | Freq: Once | INTRAVENOUS | Status: AC
  Administered 2017-12-08: 750 mg via INTRAVENOUS
  Filled 2017-12-08: qty 15

## 2017-12-08 MED ORDER — LORAZEPAM 1 MG PO TABS
ORAL_TABLET | ORAL | Status: AC
  Filled 2017-12-08: qty 1

## 2017-12-08 MED ORDER — SODIUM CHLORIDE 0.9 % IV SOLN
Freq: Once | INTRAVENOUS | Status: AC
  Administered 2017-12-08: 10:00:00 via INTRAVENOUS

## 2017-12-08 MED ORDER — DEXAMETHASONE SODIUM PHOSPHATE 10 MG/ML IJ SOLN
10.0000 mg | Freq: Once | INTRAMUSCULAR | Status: AC
  Administered 2017-12-08: 10 mg via INTRAVENOUS

## 2017-12-08 MED ORDER — SODIUM CHLORIDE 0.9 % IV SOLN
2400.0000 mg/m2 | INTRAVENOUS | Status: DC
  Administered 2017-12-08: 4500 mg via INTRAVENOUS
  Filled 2017-12-08: qty 90

## 2017-12-08 MED ORDER — PALONOSETRON HCL INJECTION 0.25 MG/5ML
0.2500 mg | Freq: Once | INTRAVENOUS | Status: AC
  Administered 2017-12-08: 0.25 mg via INTRAVENOUS

## 2017-12-08 MED ORDER — SODIUM CHLORIDE 0.9% FLUSH
10.0000 mL | INTRAVENOUS | Status: DC | PRN
  Filled 2017-12-08: qty 10

## 2017-12-08 MED ORDER — DEXAMETHASONE SODIUM PHOSPHATE 10 MG/ML IJ SOLN
INTRAMUSCULAR | Status: AC
  Filled 2017-12-08: qty 1

## 2017-12-08 NOTE — Progress Notes (Signed)
Hematology and Oncology Follow Up Visit  Margaret Elliott [id number] 04-19-1951    12/08/17   Principle Diagnosis: 67 year old woman with stage IV colon cancer diagnosed in June 2014.  At that time she presented with a T3N1 disease and subsequently developed hepatic metastasis and peritoneal metastasis. Her tumor is microsatellite stable with NRAS mutation.   Prior Therapy:  She is status post laparoscopic laparotomy right hemicolectomy with ileocolonic anastomosis done on December 09, 2012.   FOLFOX and a Avastin chemotherapy started 01/18/2013. She is S/P 12 cycles completed in 05/2013.   She is a status post microwave ablation of metastatic lesion in the posterior segment of the right lobe of the liver completed on 09/28/2014 and repeated on 11/30/2014.  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  FOLFIRI and a Avastin salvage therapy started on 12/10/2015.  Current therapy:     Maintenance 5-FU, leucovorin and a Avastin every 4 weeks.   Interim History:  Ms. Wartman presents today for a follow-up visit.  Since the last visit, she reports no major changes or complaints.  She tolerated the last cycle of 5-FU, leucovorin and a Avastin maintenance without any major concerns.  She did report grade 1 fatigue as well as grade 1 irritation of her palms but no other complaints.  She denies any worsening diarrhea or abdominal distention.  Her performance status and activity level remains intact.  He has no issues reported with her Port-A-Cath at this time.  Her appetite is unchanged.  Her weight remains stable.  She reports no headaches, blurry vision, syncope, syncope or seizures.  She does report occasional orthostasis but no confusion or alteration in mental status.  She does not report any fevers, chills or sweats.  She does not report any chest pain, palpitation or orthopnea. She does not report any wheezing,  hemoptysis.  She denies any nausea, vomiting, constipation or diarrhea.  He denies any hematochezia or melena.  She does not report any frequency urgency or hesitancy. Does not report any easy bruising or petechiae.  She denies any anxiety or depression.  She does not report any bone pain or pathological fractures.  She denies any heat or cold intolerance.  She does not report any lymphadenopathy or petechiae.  Rest of her review of systems is negative.   Medications: I have reviewed the patient's current medications.  Current Outpatient Prescriptions  Medication Sig Dispense Refill  . acetaminophen (TYLENOL) 500 MG tablet Take 500 mg by mouth every 6 (six) hours as needed for pain.      . diphenhydrAMINE (BENADRYL) 25 mg capsule Take 25-50 mg by mouth daily as needed for itching. For allergic reaction      . lidocaine-prilocaine (EMLA) cream Apply 1 application topically as needed. Apply approx 1/2 tsp to skin over port, prior to chemotherapy treatments  1 kit  3  . Multiple Vitamin (MULTIVITAMIN) tablet Take 1 tablet by mouth daily.      . ondansetron (ZOFRAN) 8 MG tablet Take 1 tablet (8 mg total) by mouth every 8 (eight) hours as needed for nausea.  30 tablet  1   No current facility-administered medications for this visit.     Allergies: No Known Allergies  Past Medical History, Surgical history, Social history, and Family History reviewed today and remain unchanged.    Physical Exam: Blood pressure (!) 158/92, pulse 80, temperature 97.9 F (36.6 C), temperature source Oral, resp. rate 18, height  $'5\' 4"'G$  (1.626 m), weight 156 lb 8 oz (71 kg), SpO2 99 %.    ECOG: 0 General appearance: Alert, awake woman without distress. Head: Atraumatic without abnormalities. Oropharynx: Mucous membranes are moist and pink. Eyes: Pupils are equal and round reactive to light. Lymph nodes: No lymphadenopathy noted in the axillary, supraclavicular and inguinal regions. Heart: Regular rate and rhythm  without any murmurs or gallops.  No leg edema. Lung: Clear in all lung fields without any rhonchi or wheezes. Abdomen: Soft, without any rebound or guarding.  No shifting dullness or ascites. Neurological: No motor or sensory deficits. Skin: Moist without any ecchymosis or petechiae.  Slight irritation noted on her palms. Musculoskeletal: No clubbing or cyanosis. Psychiatric: Appropriate mood and affect.  CBC    Component Value Date/Time   WBC 5.1 12/08/2017 0834   WBC 4.2 09/08/2017 0834   RBC 4.12 12/08/2017 0834   HGB 13.3 12/08/2017 0834   HGB 12.3 05/26/2017 0809   HCT 40.1 12/08/2017 0834   HCT 37.6 05/26/2017 0809   PLT 224 12/08/2017 0834   PLT 180 05/26/2017 0809   MCV 97.4 12/08/2017 0834   MCV 100.3 05/26/2017 0809   MCH 32.3 12/08/2017 0834   MCHC 33.2 12/08/2017 0834   RDW 14.6 (H) 12/08/2017 0834   RDW 17.4 (H) 05/26/2017 0809   LYMPHSABS 1.3 12/08/2017 0834   LYMPHSABS 1.2 05/26/2017 0809   MONOABS 0.4 12/08/2017 0834   MONOABS 0.5 05/26/2017 0809   EOSABS 0.1 12/08/2017 0834   EOSABS 0.1 05/26/2017 0809   BASOSABS 0.0 12/08/2017 0834   BASOSABS 0.0 05/26/2017 0809    EXAM: CT CHEST, ABDOMEN, AND PELVIS WITH CONTRAST  TECHNIQUE: Multidetector CT imaging of the chest, abdomen and pelvis was performed following the standard protocol during bolus administration of intravenous contrast.  CONTRAST:  157m ISOVUE-300 IOPAMIDOL (ISOVUE-300) INJECTION 61%  COMPARISON:  05/24/2017  FINDINGS: CT CHEST FINDINGS  Cardiovascular: The heart size is normal. No substantial pericardial effusion. Coronary artery calcification is evident. Atherosclerotic calcification is noted in the wall of the thoracic aorta. Left Port-A-Cath tip is positioned at the SVC/RA junction.  Mediastinum/Nodes: No mediastinal lymphadenopathy. There is no hilar lymphadenopathy. The esophagus has normal imaging features. There is no axillary lymphadenopathy.  Lungs/Pleura: 5 mm  anterior left upper lobe pulmonary nodule (image 66/series 4) is stable and also unchanged comparing back to 06/16/2016. Tiny left apical nodule (24/4) is unchanged. No new pulmonary nodule or mass.  Musculoskeletal: No worrisome lytic or sclerotic osseous abnormality.  CT ABDOMEN PELVIS FINDINGS  Hepatobiliary: Previously identified lesion along the subcapsular aspect of the posterior right liver is again noted. This is compatible with adjacent ablation defects and in toto measures 4.8 x 1.8 x 3.0 cm on today's study compared to 4.7 x 1.6 x 2.5 cm previously. Tiny hypodensities in the medial segment left liver are unchanged. No new liver lesion evident. There is no evidence for gallstones, gallbladder wall thickening, or pericholecystic fluid. No intrahepatic or extrahepatic biliary dilation.  Pancreas: No focal mass lesion. No dilatation of the main duct. No intraparenchymal cyst. No peripancreatic edema.  Spleen: No splenomegaly. No focal mass lesion.  Adrenals/Urinary Tract: No adrenal nodule or mass. Stable cyst in the upper pole the right kidney. Tiny hypoattenuating lesions in each kidney are too small to characterize but appear stable. No evidence for hydroureter. The urinary bladder appears normal for the degree of distention.  Stomach/Bowel: Stomach is nondistended. No gastric wall thickening. No evidence of outlet obstruction. Duodenum is  normally positioned as is the ligament of Treitz. No small bowel wall thickening. No small bowel dilatation. Status post right hemicolectomy. Diverticuli are seen scattered along the entire length of the colon without CT findings of diverticulitis.  Vascular/Lymphatic: No abdominal aortic aneurysm. There is no gastrohepatic or hepatoduodenal ligament lymphadenopathy. No intraperitoneal or retroperitoneal lymphadenopathy. No pelvic sidewall lymphadenopathy.  Reproductive: Uterus surgically absent.  There is no adnexal  mass.  Other: No intraperitoneal free fluid. The omental disease identified previously is not appreciably changed in the interval. The bandlike component (image 77/series 2) measures 10 mm in thickness, stable. Several left omental nodules are seen on images 67 and 68 of series 2.  Musculoskeletal: No worrisome lytic or sclerotic osseous abnormality.  IMPRESSION: 1. Stable exam. No new or progressive interval finding. 2. Stable appearance of omental nodularity. 3. Ablation defects in the posterior right liver measure minimally larger today but this may be related to slice collimation. No features on the current study to suggest local recurrence. 4. Tiny anterior left upper lobe pulmonary nodule remains stable. 5.  Aortic Atherosclerois (ICD10-170.0)    Impression and Plan:  67 year old woman with:   1.  Metastatic colorectal cancer with disease to the liver as well as the peritoneum documented in 2017 after initial diagnosis of T3N1 colon cancer diagnosed in 2014.  She is status post the treatment outlined above and currently receiving maintenance doses of 5-FU, leucovorin and a Avastin.  CT scan obtained on 12/01/2017 was personally reviewed today and discussed with the patient.  She appears to have stable disease overall without any progression of her existing metastasis or new metastasis.  The natural course of this disease as well as treatment options moving forward were reviewed today.  She continues to have incurable malignancy but disease that has been reasonably maintained on the current approach.  Risks and benefits of continuing maintenance chemotherapy was reviewed today and she is agreeable to continue to do so but on every 4-week interval.  The plan is to repeat imaging studies in 6 months.  She understands that she will likely require different therapy for her incurable cancer.  She also understands her disease has been palliated effectively for an extended period of  time.   2. Intravenous access.  Port-A-Cath remain in place without any issues and no complications to using it periodically.  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  4.  Nausea: No exacerbation of her nausea at this time.  Compazine has been effective in eliminating any vomiting episodes.  5.  Left shoulder pain: Stable at this time without any recent exacerbations.  Referral to orthopedics have already been made.  6.  Prognosis: Was discussed today in detail with the patient.  She has an incurable malignancy but disease that has been palliated for at least 5 years now.  Her performance status is excellent and aggressive therapy is warranted.  7. Followup: Will be in 4 weeks for her next dose of maintenance chemotherapy.  25 minutes spent today face-to-face with the patient.  More than 50% of today's visit was spent on counseling, education and discussing the natural course of her disease as well as outlining treatment options and reviewing her prognosis.   Zola Button MD 12/08/17

## 2017-12-08 NOTE — Patient Instructions (Addendum)
[facility] Discharge Instructions for Patients Receiving Chemotherapy  Today you received the following chemotherapy agents:  Leucovorin, Avastin, and 5FU.  To help prevent nausea and vomiting after your treatment, we encourage you to take your nausea medication as directed.   If you develop nausea and vomiting that is not controlled by your nausea medication, call the clinic.   BELOW ARE SYMPTOMS THAT SHOULD BE REPORTED IMMEDIATELY:  *FEVER GREATER THAN 100.5 F  *CHILLS WITH OR WITHOUT FEVER  NAUSEA AND VOMITING THAT IS NOT CONTROLLED WITH YOUR NAUSEA MEDICATION  *UNUSUAL SHORTNESS OF BREATH  *UNUSUAL BRUISING OR BLEEDING  TENDERNESS IN MOUTH AND THROAT WITH OR WITHOUT PRESENCE OF ULCERS  *URINARY PROBLEMS  *BOWEL PROBLEMS  UNUSUAL RASH Items with * indicate a potential emergency and should be followed up as soon as possible.  Feel free to call the clinic should you have any questions or concerns. The clinic phone number is [phone].  Please show the CHEMO ALERT CARD at check-in to the Emergency Department and triage nurse.   

## 2017-12-08 NOTE — Telephone Encounter (Signed)
Unable to schedule appt 6/19 los , due to availability. - logged - will call patient when appt is scheduled.

## 2017-12-10 ENCOUNTER — Inpatient Hospital Stay: Payer: Medicare Other

## 2017-12-10 VITALS — BP 130/88 | HR 84 | Temp 98.1°F | Resp 18

## 2017-12-10 DIAGNOSIS — K6389 Other specified diseases of intestine: Secondary | ICD-10-CM

## 2017-12-10 DIAGNOSIS — Z5112 Encounter for antineoplastic immunotherapy: Secondary | ICD-10-CM | POA: Diagnosis not present

## 2017-12-10 DIAGNOSIS — C189 Malignant neoplasm of colon, unspecified: Secondary | ICD-10-CM

## 2017-12-10 DIAGNOSIS — Z5111 Encounter for antineoplastic chemotherapy: Secondary | ICD-10-CM | POA: Diagnosis not present

## 2017-12-10 DIAGNOSIS — K769 Liver disease, unspecified: Secondary | ICD-10-CM

## 2017-12-10 DIAGNOSIS — Z452 Encounter for adjustment and management of vascular access device: Secondary | ICD-10-CM | POA: Diagnosis not present

## 2017-12-10 DIAGNOSIS — C786 Secondary malignant neoplasm of retroperitoneum and peritoneum: Secondary | ICD-10-CM | POA: Diagnosis not present

## 2017-12-10 DIAGNOSIS — C180 Malignant neoplasm of cecum: Secondary | ICD-10-CM | POA: Diagnosis not present

## 2017-12-10 DIAGNOSIS — C787 Secondary malignant neoplasm of liver and intrahepatic bile duct: Secondary | ICD-10-CM | POA: Diagnosis not present

## 2017-12-10 MED ORDER — HEPARIN SOD (PORK) LOCK FLUSH 100 UNIT/ML IV SOLN
500.0000 [IU] | Freq: Once | INTRAVENOUS | Status: AC | PRN
  Administered 2017-12-10: 500 [IU]
  Filled 2017-12-10: qty 5

## 2017-12-10 MED ORDER — SODIUM CHLORIDE 0.9% FLUSH
10.0000 mL | INTRAVENOUS | Status: DC | PRN
  Administered 2017-12-10: 10 mL
  Filled 2017-12-10: qty 10

## 2018-01-04 ENCOUNTER — Ambulatory Visit: Payer: Medicare Other

## 2018-01-04 ENCOUNTER — Other Ambulatory Visit: Payer: Medicare Other

## 2018-01-04 ENCOUNTER — Ambulatory Visit: Payer: Medicare Other | Admitting: Oncology

## 2018-01-05 ENCOUNTER — Other Ambulatory Visit: Payer: Self-pay | Admitting: *Deleted

## 2018-01-05 ENCOUNTER — Inpatient Hospital Stay: Payer: Medicare Other | Attending: Oncology

## 2018-01-05 ENCOUNTER — Inpatient Hospital Stay: Payer: Medicare Other

## 2018-01-05 ENCOUNTER — Inpatient Hospital Stay (HOSPITAL_BASED_OUTPATIENT_CLINIC_OR_DEPARTMENT_OTHER): Payer: Medicare Other | Admitting: Oncology

## 2018-01-05 ENCOUNTER — Telehealth: Payer: Self-pay | Admitting: Oncology

## 2018-01-05 VITALS — BP 141/91 | HR 90 | Temp 98.3°F | Resp 18 | Ht 64.0 in | Wt 155.8 lb

## 2018-01-05 VITALS — BP 125/81

## 2018-01-05 DIAGNOSIS — C180 Malignant neoplasm of cecum: Secondary | ICD-10-CM | POA: Insufficient documentation

## 2018-01-05 DIAGNOSIS — C786 Secondary malignant neoplasm of retroperitoneum and peritoneum: Secondary | ICD-10-CM | POA: Diagnosis not present

## 2018-01-05 DIAGNOSIS — G620 Drug-induced polyneuropathy: Secondary | ICD-10-CM

## 2018-01-05 DIAGNOSIS — F419 Anxiety disorder, unspecified: Secondary | ICD-10-CM | POA: Insufficient documentation

## 2018-01-05 DIAGNOSIS — Z452 Encounter for adjustment and management of vascular access device: Secondary | ICD-10-CM | POA: Diagnosis not present

## 2018-01-05 DIAGNOSIS — Z5112 Encounter for antineoplastic immunotherapy: Secondary | ICD-10-CM | POA: Diagnosis not present

## 2018-01-05 DIAGNOSIS — C189 Malignant neoplasm of colon, unspecified: Secondary | ICD-10-CM

## 2018-01-05 DIAGNOSIS — T451X5S Adverse effect of antineoplastic and immunosuppressive drugs, sequela: Secondary | ICD-10-CM | POA: Insufficient documentation

## 2018-01-05 DIAGNOSIS — R5383 Other fatigue: Secondary | ICD-10-CM

## 2018-01-05 DIAGNOSIS — Z79899 Other long term (current) drug therapy: Secondary | ICD-10-CM

## 2018-01-05 DIAGNOSIS — R110 Nausea: Secondary | ICD-10-CM | POA: Diagnosis not present

## 2018-01-05 DIAGNOSIS — G4709 Other insomnia: Secondary | ICD-10-CM | POA: Insufficient documentation

## 2018-01-05 DIAGNOSIS — C787 Secondary malignant neoplasm of liver and intrahepatic bile duct: Secondary | ICD-10-CM

## 2018-01-05 DIAGNOSIS — K769 Liver disease, unspecified: Secondary | ICD-10-CM

## 2018-01-05 DIAGNOSIS — K6389 Other specified diseases of intestine: Secondary | ICD-10-CM

## 2018-01-05 DIAGNOSIS — Z95828 Presence of other vascular implants and grafts: Secondary | ICD-10-CM

## 2018-01-05 LAB — CMP (CANCER CENTER ONLY)
ALT: 13 U/L (ref 0–44)
AST: 18 U/L (ref 15–41)
Albumin: 3.7 g/dL (ref 3.5–5.0)
Alkaline Phosphatase: 80 U/L (ref 38–126)
Anion gap: 6 (ref 5–15)
BILIRUBIN TOTAL: 0.6 mg/dL (ref 0.3–1.2)
BUN: 16 mg/dL (ref 8–23)
CO2: 26 mmol/L (ref 22–32)
CREATININE: 1.02 mg/dL — AB (ref 0.44–1.00)
Calcium: 9.7 mg/dL (ref 8.9–10.3)
Chloride: 106 mmol/L (ref 98–111)
GFR, EST NON AFRICAN AMERICAN: 56 mL/min — AB (ref >60)
Glucose, Bld: 81 mg/dL (ref 70–99)
POTASSIUM: 4.1 mmol/L (ref 3.5–5.1)
Sodium: 138 mmol/L (ref 135–145)
TOTAL PROTEIN: 7.7 g/dL (ref 6.5–8.1)

## 2018-01-05 LAB — CBC WITH DIFFERENTIAL (CANCER CENTER ONLY)
BASOS ABS: 0 10*3/uL (ref 0.0–0.1)
Basophils Relative: 1 %
Eosinophils Absolute: 0.1 10*3/uL (ref 0.0–0.5)
Eosinophils Relative: 2 %
HEMATOCRIT: 40.8 % (ref 34.8–46.6)
Hemoglobin: 13.5 g/dL (ref 11.6–15.9)
LYMPHS ABS: 1.4 10*3/uL (ref 0.9–3.3)
LYMPHS PCT: 37 %
MCH: 31.7 pg (ref 25.1–34.0)
MCHC: 33 g/dL (ref 31.5–36.0)
MCV: 95.9 fL (ref 79.5–101.0)
MONO ABS: 0.4 10*3/uL (ref 0.1–0.9)
MONOS PCT: 11 %
Neutro Abs: 2 10*3/uL (ref 1.5–6.5)
Neutrophils Relative %: 49 %
Platelet Count: 217 10*3/uL (ref 145–400)
RBC: 4.25 MIL/uL (ref 3.70–5.45)
RDW: 14.4 % (ref 11.2–14.5)
WBC Count: 3.9 10*3/uL (ref 3.9–10.3)

## 2018-01-05 LAB — CEA (IN HOUSE-CHCC): CEA (CHCC-IN HOUSE): 5.35 ng/mL — AB (ref 0.00–5.00)

## 2018-01-05 MED ORDER — SODIUM CHLORIDE 0.9 % IV SOLN
5.0000 mg/kg | Freq: Once | INTRAVENOUS | Status: AC
  Administered 2018-01-05: 350 mg via INTRAVENOUS
  Filled 2018-01-05: qty 14

## 2018-01-05 MED ORDER — SODIUM CHLORIDE 0.9 % IV SOLN
Freq: Once | INTRAVENOUS | Status: AC
  Administered 2018-01-05: 12:00:00 via INTRAVENOUS

## 2018-01-05 MED ORDER — FLUOROURACIL CHEMO INJECTION 2.5 GM/50ML
400.0000 mg/m2 | Freq: Once | INTRAVENOUS | Status: AC
  Administered 2018-01-05: 750 mg via INTRAVENOUS
  Filled 2018-01-05: qty 15

## 2018-01-05 MED ORDER — PALONOSETRON HCL INJECTION 0.25 MG/5ML
0.2500 mg | Freq: Once | INTRAVENOUS | Status: AC
  Administered 2018-01-05: 0.25 mg via INTRAVENOUS

## 2018-01-05 MED ORDER — SODIUM CHLORIDE 0.9 % IV SOLN: 5.0000 mg/kg | Freq: Once | INTRAVENOUS | Status: DC

## 2018-01-05 MED ORDER — SODIUM CHLORIDE 0.9 % IV SOLN
2400.0000 mg/m2 | INTRAVENOUS | Status: DC
  Administered 2018-01-05: 4500 mg via INTRAVENOUS
  Filled 2018-01-05: qty 90

## 2018-01-05 MED ORDER — LEUCOVORIN CALCIUM INJECTION 350 MG
400.0000 mg/m2 | Freq: Once | INTRAMUSCULAR | Status: AC
  Administered 2018-01-05: 752 mg via INTRAVENOUS
  Filled 2018-01-05: qty 37.6

## 2018-01-05 MED ORDER — DEXAMETHASONE SODIUM PHOSPHATE 10 MG/ML IJ SOLN
10.0000 mg | Freq: Once | INTRAMUSCULAR | Status: AC
  Administered 2018-01-05: 10 mg via INTRAVENOUS

## 2018-01-05 MED ORDER — PALONOSETRON HCL INJECTION 0.25 MG/5ML
INTRAVENOUS | Status: AC
  Filled 2018-01-05: qty 5

## 2018-01-05 MED ORDER — SODIUM CHLORIDE 0.9 % IJ SOLN
10.0000 mL | INTRAMUSCULAR | Status: DC | PRN
  Administered 2018-01-05: 10 mL via INTRAVENOUS
  Filled 2018-01-05: qty 10

## 2018-01-05 MED ORDER — LORAZEPAM 1 MG PO TABS
1.0000 mg | ORAL_TABLET | Freq: Once | ORAL | Status: AC
  Administered 2018-01-05: 1 mg via ORAL

## 2018-01-05 MED ORDER — LORAZEPAM 1 MG PO TABS
ORAL_TABLET | ORAL | Status: AC
  Filled 2018-01-05: qty 1

## 2018-01-05 MED ORDER — DEXAMETHASONE SODIUM PHOSPHATE 10 MG/ML IJ SOLN
INTRAMUSCULAR | Status: AC
  Filled 2018-01-05: qty 1

## 2018-01-05 MED ORDER — LORAZEPAM 1 MG PO TABS: 1.0000 mg | ORAL_TABLET | Freq: Three times a day (TID) | ORAL | 0 refills | Status: DC | PRN

## 2018-01-05 NOTE — Patient Instructions (Signed)
[facility] Discharge Instructions for Patients Receiving Chemotherapy  Today you received the following chemotherapy agents avastin/leucovorin/florouracil   To help prevent nausea and vomiting after your treatment, we encourage you to take your nausea medication as directed  If you develop nausea and vomiting that is not controlled by your nausea medication, call the clinic.   BELOW ARE SYMPTOMS THAT SHOULD BE REPORTED IMMEDIATELY:  *FEVER GREATER THAN 100.5 F  *CHILLS WITH OR WITHOUT FEVER  NAUSEA AND VOMITING THAT IS NOT CONTROLLED WITH YOUR NAUSEA MEDICATION  *UNUSUAL SHORTNESS OF BREATH  *UNUSUAL BRUISING OR BLEEDING  TENDERNESS IN MOUTH AND THROAT WITH OR WITHOUT PRESENCE OF ULCERS  *URINARY PROBLEMS  *BOWEL PROBLEMS  UNUSUAL RASH Items with * indicate a potential emergency and should be followed up as soon as possible.  Feel free to call the clinic you have any questions or concerns. The clinic phone number is [phone]. 3

## 2018-01-05 NOTE — Progress Notes (Signed)
Hematology and Oncology Follow Up Visit  Margaret Elliott [id number] [birth year]    01/05/18   Principle Diagnosis: 67 year old woman with T3N1 colon cancer diagnosed in June 2014.  She developed hepatic metastasis and peritoneal metastasis. Her tumor is microsatellite stable with NRAS mutation in 2017.   Prior Therapy:  She is status post laparoscopic laparotomy right hemicolectomy with ileocolonic anastomosis done on December 09, 2012.   FOLFOX and a Avastin chemotherapy started 01/18/2013. She is S/P 12 cycles completed in 05/2013.   She is a status post microwave ablation of metastatic lesion in the posterior segment of the right lobe of the liver completed on 09/28/2014 and repeated on 11/30/2014.  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  FOLFIRI and a Avastin salvage therapy started on 12/10/2015.  Current therapy:     Maintenance 5-FU, leucovorin and a Avastin every 4 weeks.   Interim History:  [name] is here for a follow-up visit.  Since last visit, she received chemotherapy 4 weeks ago without complications.  She does report grade 1 fatigue and nausea but no vomiting associated with it.  The symptoms last for 2 to 3 days and improves after that.  She is able to eat well and continues to attend to activities of daily living.  She does not report any abdominal distention or early satiety.  She denies any hematochezia or melena.  Her peripheral neuropathy has improved in her upper extremities and very faint in her lower extremities.   She reports no headaches, blurry vision, syncope, syncope or seizures.  She denies any alteration in mental status or confusion.  She does not report any fevers, chills or sweats.  She does not report any chest pain, palpitation or orthopnea. She does not report any wheezing, hemoptysis.  She denies any nausea, vomiting, constipation or diarrhea.  He denies any hematochezia  or melena.  She denies any abdominal distention or weight loss.  She does not report any frequency urgency or hesitancy. Does not report any easy bruising or petechiae.  She does not report any bone pain or pathological fractures.  She does report insomnia and occasional anxiety for which she takes Ativan.  She does not report any lymphadenopathy or petechiae.  Rest of her review of systems is negative.   Medications: I have reviewed the patient's current medications.  Current Outpatient Prescriptions  Medication Sig Dispense Refill  . acetaminophen (TYLENOL) 500 MG tablet Take 500 mg by mouth every 6 (six) hours as needed for pain.      . diphenhydrAMINE (BENADRYL) 25 mg capsule Take 25-50 mg by mouth daily as needed for itching. For allergic reaction      . lidocaine-prilocaine (EMLA) cream Apply 1 application topically as needed. Apply approx 1/2 tsp to skin over port, prior to chemotherapy treatments  1 kit  3  . Multiple Vitamin (MULTIVITAMIN) tablet Take 1 tablet by mouth daily.      . ondansetron (ZOFRAN) 8 MG tablet Take 1 tablet (8 mg total) by mouth every 8 (eight) hours as needed for nausea.  30 tablet  1   No current facility-administered medications for this visit.     Allergies: No Known Allergies  Past Medical History, Surgical history, Social history, and Family History reviewed today and remain unchanged.    Physical Exam: Blood pressure (!) 141/91, pulse 90, temperature 98.3 F (36.8 C), temperature source Oral, resp. rate 18, height _0  (1.626  m), weight 155 lb 12.8 oz (70.7 kg), SpO2 100 %.    ECOG: 0 General appearance: Well-appearing woman without distress. Head: Atraumatic without abnormalities. Oropharynx: No oral thrush or ulcers. Eyes: Sclera anicteric. Lymph nodes: No cervical, axillary, supraclavicular or inguinal lymphadenopathy. Heart: Regular rate and rhythm without any murmurs or gallops.  S1 and S2. Lung: Clear without any rhonchi, wheezes or  dullness to percussion. Abdomen: Soft, without any rebound or guarding.  No shifting dullness or ascites. Neurological: No deficits noted motor or sensory exam. Skin: No ecchymosis or petechiae. Musculoskeletal: No joint deformity or effusion.   CBC    Component Value Date/Time   WBC 3.9 01/05/2018 1119   WBC 4.2 09/08/2017 0834   RBC 4.25 01/05/2018 1119   HGB 13.5 01/05/2018 1119   HGB 12.3 05/26/2017 0809   HCT 40.8 01/05/2018 1119   HCT 37.6 05/26/2017 0809   PLT 217 01/05/2018 1119   PLT 180 05/26/2017 0809   MCV 95.9 01/05/2018 1119   MCV 100.3 05/26/2017 0809   MCH 31.7 01/05/2018 1119   MCHC 33.0 01/05/2018 1119   RDW 14.4 01/05/2018 1119   RDW 17.4 (H) 05/26/2017 0809   LYMPHSABS 1.4 01/05/2018 1119   LYMPHSABS 1.2 05/26/2017 0809   MONOABS 0.4 01/05/2018 1119   MONOABS 0.5 05/26/2017 0809   EOSABS 0.1 01/05/2018 1119   EOSABS 0.1 05/26/2017 0809   BASOSABS 0.0 01/05/2018 1119   BASOSABS 0.0 05/26/2017 0809      Impression and Plan:  66 year old woman with:   1.  T3N1 colon cancer diagnosed in 2014.  She developed subsequently metastatic disease to the liver and peritoneum.  She is status post treatment outlined above and currently on maintenance 5-FU, leucovorin and a Avastin.   The natural course of this disease was reviewed today and her imaging studies obtained in June was also discussed again.  She remains on maintenance chemotherapy without complications and risks and benefits of this approach was reviewed.  After discussion today we have elected to continue with the same approach with maintenance on a monthly basis of 5-FU, leucovorin and a Avastin and we will repeat imaging studies will be done next 6 months.   2. Intravenous access.  Port-A-Cath in use without complications.  3. Neuropathy: Appears to be improving over period of time off oxalic plan.  4.  Nausea: Managed well with current antiemetics.  5.  Anxiety and insomnia: She is Ativan that  she uses periodically which was refilled to her.  6.  Prognosis: Her disease is incurable but can be palliated extensively.  Her performance status is excellent and aggressive therapy is warranted.  7. Followup: Will be in 4 weeks for her next dose of maintenance chemotherapy.  15 minutes spent today face-to-face with the patient.  More than 50% of today's visit was spent on counseling, education and coordinating her future plan of care.   Zola Button MD 01/05/18

## 2018-01-05 NOTE — Telephone Encounter (Signed)
Scheduled apt per 7/17 los - gave patient aVS and calender per los.

## 2018-01-05 NOTE — Telephone Encounter (Signed)
Pt aware of gap on 8/14 appt - no other f./u time available to go with treatment .

## 2018-01-07 ENCOUNTER — Inpatient Hospital Stay: Payer: Medicare Other

## 2018-01-07 VITALS — BP 128/88 | HR 88 | Temp 98.1°F | Resp 18

## 2018-01-07 DIAGNOSIS — K6389 Other specified diseases of intestine: Secondary | ICD-10-CM

## 2018-01-07 DIAGNOSIS — Z452 Encounter for adjustment and management of vascular access device: Secondary | ICD-10-CM | POA: Diagnosis not present

## 2018-01-07 DIAGNOSIS — C189 Malignant neoplasm of colon, unspecified: Secondary | ICD-10-CM

## 2018-01-07 DIAGNOSIS — R110 Nausea: Secondary | ICD-10-CM | POA: Diagnosis not present

## 2018-01-07 DIAGNOSIS — C180 Malignant neoplasm of cecum: Secondary | ICD-10-CM | POA: Diagnosis not present

## 2018-01-07 DIAGNOSIS — C787 Secondary malignant neoplasm of liver and intrahepatic bile duct: Secondary | ICD-10-CM | POA: Diagnosis not present

## 2018-01-07 DIAGNOSIS — K769 Liver disease, unspecified: Secondary | ICD-10-CM

## 2018-01-07 DIAGNOSIS — C786 Secondary malignant neoplasm of retroperitoneum and peritoneum: Secondary | ICD-10-CM | POA: Diagnosis not present

## 2018-01-07 DIAGNOSIS — Z5112 Encounter for antineoplastic immunotherapy: Secondary | ICD-10-CM | POA: Diagnosis not present

## 2018-01-07 MED ORDER — HEPARIN SOD (PORK) LOCK FLUSH 100 UNIT/ML IV SOLN
500.0000 [IU] | Freq: Once | INTRAVENOUS | Status: AC | PRN
  Administered 2018-01-07: 500 [IU]
  Filled 2018-01-07: qty 5

## 2018-01-07 MED ORDER — SODIUM CHLORIDE 0.9% FLUSH
10.0000 mL | INTRAVENOUS | Status: DC | PRN
  Administered 2018-01-07: 10 mL
  Filled 2018-01-07: qty 10

## 2018-02-02 ENCOUNTER — Inpatient Hospital Stay: Payer: Medicare Other

## 2018-02-02 ENCOUNTER — Inpatient Hospital Stay (HOSPITAL_BASED_OUTPATIENT_CLINIC_OR_DEPARTMENT_OTHER): Payer: Medicare Other | Admitting: Oncology

## 2018-02-02 ENCOUNTER — Inpatient Hospital Stay: Payer: Medicare Other | Attending: Oncology

## 2018-02-02 ENCOUNTER — Telehealth: Payer: Self-pay | Admitting: Oncology

## 2018-02-02 VITALS — BP 144/93 | HR 84 | Temp 98.3°F | Resp 18 | Ht 64.0 in | Wt 156.8 lb

## 2018-02-02 VITALS — BP 142/80

## 2018-02-02 DIAGNOSIS — C787 Secondary malignant neoplasm of liver and intrahepatic bile duct: Secondary | ICD-10-CM

## 2018-02-02 DIAGNOSIS — K769 Liver disease, unspecified: Secondary | ICD-10-CM

## 2018-02-02 DIAGNOSIS — C180 Malignant neoplasm of cecum: Secondary | ICD-10-CM | POA: Diagnosis not present

## 2018-02-02 DIAGNOSIS — C786 Secondary malignant neoplasm of retroperitoneum and peritoneum: Secondary | ICD-10-CM | POA: Insufficient documentation

## 2018-02-02 DIAGNOSIS — F419 Anxiety disorder, unspecified: Secondary | ICD-10-CM | POA: Diagnosis not present

## 2018-02-02 DIAGNOSIS — G629 Polyneuropathy, unspecified: Secondary | ICD-10-CM | POA: Diagnosis not present

## 2018-02-02 DIAGNOSIS — C189 Malignant neoplasm of colon, unspecified: Secondary | ICD-10-CM

## 2018-02-02 DIAGNOSIS — Z79899 Other long term (current) drug therapy: Secondary | ICD-10-CM | POA: Diagnosis not present

## 2018-02-02 DIAGNOSIS — G4700 Insomnia, unspecified: Secondary | ICD-10-CM | POA: Diagnosis not present

## 2018-02-02 DIAGNOSIS — Z9221 Personal history of antineoplastic chemotherapy: Secondary | ICD-10-CM

## 2018-02-02 DIAGNOSIS — R110 Nausea: Secondary | ICD-10-CM

## 2018-02-02 DIAGNOSIS — K6389 Other specified diseases of intestine: Secondary | ICD-10-CM

## 2018-02-02 DIAGNOSIS — Z5112 Encounter for antineoplastic immunotherapy: Secondary | ICD-10-CM | POA: Insufficient documentation

## 2018-02-02 DIAGNOSIS — Z95828 Presence of other vascular implants and grafts: Secondary | ICD-10-CM

## 2018-02-02 LAB — CBC WITH DIFFERENTIAL (CANCER CENTER ONLY)
Basophils Absolute: 0 10*3/uL (ref 0.0–0.1)
Basophils Relative: 1 %
Eosinophils Absolute: 0.1 10*3/uL (ref 0.0–0.5)
Eosinophils Relative: 2 %
HCT: 39.3 % (ref 34.8–46.6)
Hemoglobin: 13.1 g/dL (ref 11.6–15.9)
LYMPHS ABS: 1.5 10*3/uL (ref 0.9–3.3)
LYMPHS PCT: 41 %
MCH: 31.9 pg (ref 25.1–34.0)
MCHC: 33.3 g/dL (ref 31.5–36.0)
MCV: 95.7 fL (ref 79.5–101.0)
Monocytes Absolute: 0.4 10*3/uL (ref 0.1–0.9)
Monocytes Relative: 11 %
NEUTROS PCT: 45 %
Neutro Abs: 1.7 10*3/uL (ref 1.5–6.5)
Platelet Count: 226 10*3/uL (ref 145–400)
RBC: 4.11 MIL/uL (ref 3.70–5.45)
RDW: 15 % — ABNORMAL HIGH (ref 11.2–14.5)
WBC: 3.6 10*3/uL — AB (ref 3.9–10.3)

## 2018-02-02 LAB — CMP (CANCER CENTER ONLY)
ALT: 14 U/L (ref 0–44)
AST: 20 U/L (ref 15–41)
Albumin: 3.6 g/dL (ref 3.5–5.0)
Alkaline Phosphatase: 93 U/L (ref 38–126)
Anion gap: 10 (ref 5–15)
BUN: 16 mg/dL (ref 8–23)
CHLORIDE: 109 mmol/L (ref 98–111)
CO2: 23 mmol/L (ref 22–32)
Calcium: 9.2 mg/dL (ref 8.9–10.3)
Creatinine: 0.86 mg/dL (ref 0.44–1.00)
Glucose, Bld: 87 mg/dL (ref 70–99)
POTASSIUM: 3.9 mmol/L (ref 3.5–5.1)
Sodium: 142 mmol/L (ref 135–145)
Total Bilirubin: 0.5 mg/dL (ref 0.3–1.2)
Total Protein: 7.5 g/dL (ref 6.5–8.1)

## 2018-02-02 LAB — CEA (IN HOUSE-CHCC): CEA (CHCC-IN HOUSE): 5.72 ng/mL — AB (ref 0.00–5.00)

## 2018-02-02 MED ORDER — PALONOSETRON HCL INJECTION 0.25 MG/5ML
0.2500 mg | Freq: Once | INTRAVENOUS | Status: AC
  Administered 2018-02-02: 0.25 mg via INTRAVENOUS

## 2018-02-02 MED ORDER — DEXAMETHASONE SODIUM PHOSPHATE 10 MG/ML IJ SOLN
INTRAMUSCULAR | Status: AC
  Filled 2018-02-02: qty 1

## 2018-02-02 MED ORDER — SODIUM CHLORIDE 0.9 % IV SOLN
2400.0000 mg/m2 | INTRAVENOUS | Status: DC
  Administered 2018-02-02: 4500 mg via INTRAVENOUS
  Filled 2018-02-02: qty 90

## 2018-02-02 MED ORDER — SODIUM CHLORIDE 0.9 % IV SOLN
Freq: Once | INTRAVENOUS | Status: AC
  Administered 2018-02-02: 12:00:00 via INTRAVENOUS
  Filled 2018-02-02: qty 250

## 2018-02-02 MED ORDER — PALONOSETRON HCL INJECTION 0.25 MG/5ML
INTRAVENOUS | Status: AC
  Filled 2018-02-02: qty 5

## 2018-02-02 MED ORDER — SODIUM CHLORIDE 0.9 % IJ SOLN
10.0000 mL | INTRAMUSCULAR | Status: DC | PRN
  Administered 2018-02-02: 10 mL via INTRAVENOUS
  Filled 2018-02-02: qty 10

## 2018-02-02 MED ORDER — LORAZEPAM 1 MG PO TABS
1.0000 mg | ORAL_TABLET | Freq: Once | ORAL | Status: AC
  Administered 2018-02-02: 1 mg via ORAL

## 2018-02-02 MED ORDER — SODIUM CHLORIDE 0.9 % IV SOLN
5.0000 mg/kg | Freq: Once | INTRAVENOUS | Status: AC
  Administered 2018-02-02: 350 mg via INTRAVENOUS
  Filled 2018-02-02: qty 14

## 2018-02-02 MED ORDER — DEXAMETHASONE SODIUM PHOSPHATE 10 MG/ML IJ SOLN
10.0000 mg | Freq: Once | INTRAMUSCULAR | Status: AC
  Administered 2018-02-02: 10 mg via INTRAVENOUS

## 2018-02-02 MED ORDER — LEUCOVORIN CALCIUM INJECTION 350 MG
400.0000 mg/m2 | Freq: Once | INTRAMUSCULAR | Status: AC
  Administered 2018-02-02: 752 mg via INTRAVENOUS
  Filled 2018-02-02: qty 25

## 2018-02-02 MED ORDER — FLUOROURACIL CHEMO INJECTION 2.5 GM/50ML
400.0000 mg/m2 | Freq: Once | INTRAVENOUS | Status: AC
  Administered 2018-02-02: 750 mg via INTRAVENOUS
  Filled 2018-02-02: qty 15

## 2018-02-02 MED ORDER — LORAZEPAM 1 MG PO TABS
ORAL_TABLET | ORAL | Status: AC
  Filled 2018-02-02: qty 1

## 2018-02-02 NOTE — Progress Notes (Signed)
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 Her tumor is microsatellite stable with NRAS mutation in 2017.   Prior Therapy:  She is status post laparoscopic laparotomy right hemicolectomy with ileocolonic anastomosis done on December 09, 2012.   FOLFOX and a Avastin chemotherapy started 01/18/2013. She is S/P 12 cycles completed in 05/2013.   She is a status post microwave ablation of metastatic lesion in the posterior segment of the right lobe of the liver completed on 09/28/2014 and repeated on 11/30/2014.  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  FOLFIRI and a Avastin salvage therapy started on 12/10/2015.  Current therapy:     Maintenance 5-FU, leucovorin and a Avastin every 4 weeks.   Interim History:  Margaret Elliott presents today for a follow-up visit.  Since the last visit, she reports no major changes in her health.  She continues to receive chemotherapy on every 4-week basis for maintenance purposes and has tolerated this regimen well.  She does report a very mild left lower quadrant abdominal discomfort in the morning that resolves spontaneously.  This is not associated with any nausea, vomiting or change in her bowel habits.  She remains active and attends to activities of daily living.  Her performance status and quality of life remain excellent.  Denies any infusion related complications.   She reports no headaches, blurry vision, syncope, syncope or seizures.  She denies any dizziness or change in her mood. She does not report any fevers, chills or sweats.  She does not report any chest pain, palpitation or orthopnea. She does not report any wheezing, hemoptysis.    He denies any hematochezia or melena.  She denies  any abdominal distention or weight loss.  She does not report any frequency urgency or hesitancy. Does not report any easy bruising or petechiae.  She does not report any bone pain or pathological fractures.   She does not report any lymphadenopathy or petechiae.  Rest of her review of systems is negative.   Medications: I have reviewed the patient's current medications.  Current Outpatient Prescriptions  Medication Sig Dispense Refill  . acetaminophen (TYLENOL) 500 MG tablet Take 500 mg by mouth every 6 (six) hours as needed for pain.      . diphenhydrAMINE (BENADRYL) 25 mg capsule Take 25-50 mg by mouth daily as needed for itching. For allergic reaction      . lidocaine-prilocaine (EMLA) cream Apply 1 application topically as needed. Apply approx 1/2 tsp to skin over port, prior to chemotherapy treatments  1 kit  3  . Multiple Vitamin (MULTIVITAMIN) tablet Take 1 tablet by mouth daily.      . ondansetron (ZOFRAN) 8 MG tablet Take 1 tablet (8 mg total) by mouth every 8 (eight) hours as needed for nausea.  30 tablet  1   No current facility-administered medications for this visit.     Allergies: No Known Allergies  Past Medical History, Surgical history, Social history, and Family History reviewed today and remain unchanged.    Physical Exam: Blood pressure (!) 144/93, pulse 84, temperature 98.3 F (36.8 C), temperature source Oral, resp. rate 18, height '5\' 4"'$  (1.626 m), weight 156 lb 12.8 oz (71.1 kg), SpO2 100 %.    ECOG: 0 General appearance: Alert,  awake woman without distress. Head: Normocephalic without abnormalities. Oropharynx: Membranes are moist and pink without any ulcers. Eyes: Pupils are equal and round reactive to light. Lymph nodes: No apathy noted in the cervical, axillary, supraclavicular or inguinal areas Heart: Regular rate and rhythm without any murmurs or gallops.  Lung: Clear in all lung fields without any wheezes or dullness to percussion. Abdomen: Soft,  nontender, nondistended with good bowel sounds. Neurological: No motor or sensory deficits. Skin: No rashes or lesions.. Musculoskeletal: No clubbing or cyanosis.   CBC    Component Value Date/Time   WBC 3.6 (L) 02/02/2018 0926   WBC 4.2 09/08/2017 0834   RBC 4.11 02/02/2018 0926   HGB 13.1 02/02/2018 0926   HGB 12.3 05/26/2017 0809   HCT 39.3 02/02/2018 0926   HCT 37.6 05/26/2017 0809   PLT 226 02/02/2018 0926   PLT 180 05/26/2017 0809   MCV 95.7 02/02/2018 0926   MCV 100.3 05/26/2017 0809   MCH 31.9 02/02/2018 0926   MCHC 33.3 02/02/2018 0926   RDW 15.0 (H) 02/02/2018 0926   RDW 17.4 (H) 05/26/2017 0809   LYMPHSABS 1.5 02/02/2018 0926   LYMPHSABS 1.2 05/26/2017 0809   MONOABS 0.4 02/02/2018 0926   MONOABS 0.5 05/26/2017 0809   EOSABS 0.1 02/02/2018 0926   EOSABS 0.1 05/26/2017 0809   BASOSABS 0.0 02/02/2018 0926   BASOSABS 0.0 05/26/2017 0809      Impression and Plan:  67 year old woman with:   1.  Advanced colon cancer with metastatic disease to the peritoneum.  She was initially diagnosed with T3N1 colon cancer diagnosed in 2014.    She remains on maintenance chemotherapy without any complications at this time.  Risks and benefits of continuing 5-FU and a Avastin was discussed today.  Long-term complications were also reviewed.  For the time being she will continue to receive it every 4 weeks and repeat imaging studies in December 2019.  This will be done sooner if she develops any issues in the interim.   2.  Cather management: No complications related to her Port-A-Cath and continues to be in use without any issues.  3. Neuropathy: No changes noted since her last visit.  Manageable at this time.  4.  Nausea: She is reported since the last visit.  5.  Anxiety and insomnia: manageable at this time with ativen.   6.  Prognosis: Treatment remains palliative although her performance status is excellent and aggressive therapy is warranted.  7. Followup: Will be  in 4 weeks for a clinical visit and MD follow-up.  15 minutes spent today face-to-face with the patient.  More than 50% of today's visit was spent discussing the natural course of her disease and future treatment options.   Margaret Button MD 02/02/18

## 2018-02-02 NOTE — Telephone Encounter (Signed)
Appts scheduled AVS/Calendar printed per 8/14 los °

## 2018-02-02 NOTE — Patient Instructions (Signed)
[facility] Discharge Instructions for Patients Receiving Chemotherapy  Today you received the following chemotherapy agents avastin/leucovorin/florouracil   To help prevent nausea and vomiting after your treatment, we encourage you to take your nausea medication as directed  If you develop nausea and vomiting that is not controlled by your nausea medication, call the clinic.   BELOW ARE SYMPTOMS THAT SHOULD BE REPORTED IMMEDIATELY:  *FEVER GREATER THAN 100.5 F  *CHILLS WITH OR WITHOUT FEVER  NAUSEA AND VOMITING THAT IS NOT CONTROLLED WITH YOUR NAUSEA MEDICATION  *UNUSUAL SHORTNESS OF BREATH  *UNUSUAL BRUISING OR BLEEDING  TENDERNESS IN MOUTH AND THROAT WITH OR WITHOUT PRESENCE OF ULCERS  *URINARY PROBLEMS  *BOWEL PROBLEMS  UNUSUAL RASH Items with * indicate a potential emergency and should be followed up as soon as possible.  Feel free to call the clinic you have any questions or concerns. The clinic phone number is [phone]. 3

## 2018-02-03 ENCOUNTER — Other Ambulatory Visit: Payer: Self-pay | Admitting: Hematology and Oncology

## 2018-02-04 ENCOUNTER — Inpatient Hospital Stay: Payer: Medicare Other

## 2018-02-04 VITALS — BP 138/80 | HR 85 | Temp 98.1°F | Resp 18

## 2018-02-04 DIAGNOSIS — C180 Malignant neoplasm of cecum: Secondary | ICD-10-CM | POA: Diagnosis not present

## 2018-02-04 DIAGNOSIS — Z9221 Personal history of antineoplastic chemotherapy: Secondary | ICD-10-CM | POA: Diagnosis not present

## 2018-02-04 DIAGNOSIS — C786 Secondary malignant neoplasm of retroperitoneum and peritoneum: Secondary | ICD-10-CM | POA: Diagnosis not present

## 2018-02-04 DIAGNOSIS — C189 Malignant neoplasm of colon, unspecified: Secondary | ICD-10-CM

## 2018-02-04 DIAGNOSIS — K769 Liver disease, unspecified: Secondary | ICD-10-CM

## 2018-02-04 DIAGNOSIS — G629 Polyneuropathy, unspecified: Secondary | ICD-10-CM | POA: Diagnosis not present

## 2018-02-04 DIAGNOSIS — Z5112 Encounter for antineoplastic immunotherapy: Secondary | ICD-10-CM | POA: Diagnosis not present

## 2018-02-04 DIAGNOSIS — R110 Nausea: Secondary | ICD-10-CM | POA: Diagnosis not present

## 2018-02-04 DIAGNOSIS — K6389 Other specified diseases of intestine: Secondary | ICD-10-CM

## 2018-02-04 MED ORDER — HEPARIN SOD (PORK) LOCK FLUSH 100 UNIT/ML IV SOLN
500.0000 [IU] | Freq: Once | INTRAVENOUS | Status: AC | PRN
  Administered 2018-02-04: 500 [IU]
  Filled 2018-02-04: qty 5

## 2018-02-04 MED ORDER — SODIUM CHLORIDE 0.9% FLUSH
10.0000 mL | INTRAVENOUS | Status: DC | PRN
  Administered 2018-02-04: 10 mL
  Filled 2018-02-04: qty 10

## 2018-03-02 ENCOUNTER — Telehealth: Payer: Self-pay | Admitting: Oncology

## 2018-03-02 ENCOUNTER — Inpatient Hospital Stay: Payer: Medicare Other

## 2018-03-02 ENCOUNTER — Inpatient Hospital Stay (HOSPITAL_BASED_OUTPATIENT_CLINIC_OR_DEPARTMENT_OTHER): Payer: Medicare Other | Admitting: Oncology

## 2018-03-02 ENCOUNTER — Inpatient Hospital Stay: Payer: Medicare Other | Attending: Oncology

## 2018-03-02 VITALS — BP 141/87 | HR 80 | Temp 97.9°F | Resp 18 | Ht 64.0 in | Wt 156.3 lb

## 2018-03-02 DIAGNOSIS — C786 Secondary malignant neoplasm of retroperitoneum and peritoneum: Secondary | ICD-10-CM

## 2018-03-02 DIAGNOSIS — G4700 Insomnia, unspecified: Secondary | ICD-10-CM | POA: Diagnosis not present

## 2018-03-02 DIAGNOSIS — G629 Polyneuropathy, unspecified: Secondary | ICD-10-CM | POA: Diagnosis not present

## 2018-03-02 DIAGNOSIS — R10819 Abdominal tenderness, unspecified site: Secondary | ICD-10-CM | POA: Insufficient documentation

## 2018-03-02 DIAGNOSIS — C787 Secondary malignant neoplasm of liver and intrahepatic bile duct: Secondary | ICD-10-CM | POA: Insufficient documentation

## 2018-03-02 DIAGNOSIS — Z9049 Acquired absence of other specified parts of digestive tract: Secondary | ICD-10-CM

## 2018-03-02 DIAGNOSIS — K769 Liver disease, unspecified: Secondary | ICD-10-CM

## 2018-03-02 DIAGNOSIS — Z9221 Personal history of antineoplastic chemotherapy: Secondary | ICD-10-CM

## 2018-03-02 DIAGNOSIS — C189 Malignant neoplasm of colon, unspecified: Secondary | ICD-10-CM

## 2018-03-02 DIAGNOSIS — Z5111 Encounter for antineoplastic chemotherapy: Secondary | ICD-10-CM | POA: Insufficient documentation

## 2018-03-02 DIAGNOSIS — F419 Anxiety disorder, unspecified: Secondary | ICD-10-CM

## 2018-03-02 DIAGNOSIS — Z95828 Presence of other vascular implants and grafts: Secondary | ICD-10-CM

## 2018-03-02 DIAGNOSIS — Z5112 Encounter for antineoplastic immunotherapy: Secondary | ICD-10-CM | POA: Insufficient documentation

## 2018-03-02 DIAGNOSIS — C182 Malignant neoplasm of ascending colon: Secondary | ICD-10-CM

## 2018-03-02 DIAGNOSIS — K6389 Other specified diseases of intestine: Secondary | ICD-10-CM

## 2018-03-02 LAB — CBC WITH DIFFERENTIAL (CANCER CENTER ONLY)
BASOS PCT: 0 %
Basophils Absolute: 0 10*3/uL (ref 0.0–0.1)
EOS ABS: 0.1 10*3/uL (ref 0.0–0.5)
EOS PCT: 2 %
HCT: 39.6 % (ref 34.8–46.6)
Hemoglobin: 13.2 g/dL (ref 11.6–15.9)
LYMPHS ABS: 1.3 10*3/uL (ref 0.9–3.3)
Lymphocytes Relative: 33 %
MCH: 31.7 pg (ref 25.1–34.0)
MCHC: 33.2 g/dL (ref 31.5–36.0)
MCV: 95.4 fL (ref 79.5–101.0)
MONOS PCT: 10 %
Monocytes Absolute: 0.4 10*3/uL (ref 0.1–0.9)
Neutro Abs: 2.2 10*3/uL (ref 1.5–6.5)
Neutrophils Relative %: 55 %
PLATELETS: 245 10*3/uL (ref 145–400)
RBC: 4.15 MIL/uL (ref 3.70–5.45)
RDW: 16.1 % — ABNORMAL HIGH (ref 11.2–14.5)
WBC: 4 10*3/uL (ref 3.9–10.3)

## 2018-03-02 LAB — CMP (CANCER CENTER ONLY)
ALK PHOS: 90 U/L (ref 38–126)
ALT: 9 U/L (ref 0–44)
AST: 19 U/L (ref 15–41)
Albumin: 3.6 g/dL (ref 3.5–5.0)
Anion gap: 8 (ref 5–15)
BUN: 19 mg/dL (ref 8–23)
CALCIUM: 9.7 mg/dL (ref 8.9–10.3)
CHLORIDE: 108 mmol/L (ref 98–111)
CO2: 25 mmol/L (ref 22–32)
CREATININE: 0.97 mg/dL (ref 0.44–1.00)
GFR, EST NON AFRICAN AMERICAN: 59 mL/min — AB (ref >60)
GFR, Est AFR Am: >60 mL/min (ref >60)
Glucose, Bld: 107 mg/dL — ABNORMAL HIGH (ref 70–99)
Potassium: 3.8 mmol/L (ref 3.5–5.1)
Sodium: 141 mmol/L (ref 135–145)
Total Bilirubin: 0.7 mg/dL (ref 0.3–1.2)
Total Protein: 7.6 g/dL (ref 6.5–8.1)

## 2018-03-02 LAB — CEA (IN HOUSE-CHCC): CEA (CHCC-IN HOUSE): 6.3 ng/mL — AB (ref 0.00–5.00)

## 2018-03-02 LAB — TOTAL PROTEIN, URINE DIPSTICK: Protein, ur: <30 mg/dL — AB

## 2018-03-02 MED ORDER — DEXAMETHASONE SODIUM PHOSPHATE 10 MG/ML IJ SOLN
10.0000 mg | Freq: Once | INTRAMUSCULAR | Status: AC
  Administered 2018-03-02: 10 mg via INTRAVENOUS

## 2018-03-02 MED ORDER — SODIUM CHLORIDE 0.9 % IV SOLN
Freq: Once | INTRAVENOUS | Status: AC
  Administered 2018-03-02: 10:00:00 via INTRAVENOUS
  Filled 2018-03-02: qty 250

## 2018-03-02 MED ORDER — LEUCOVORIN CALCIUM INJECTION 350 MG
400.0000 mg/m2 | Freq: Once | INTRAVENOUS | Status: AC
  Administered 2018-03-02: 752 mg via INTRAVENOUS
  Filled 2018-03-02: qty 37.6

## 2018-03-02 MED ORDER — FLUOROURACIL CHEMO INJECTION 2.5 GM/50ML
400.0000 mg/m2 | Freq: Once | INTRAVENOUS | Status: AC
  Administered 2018-03-02: 750 mg via INTRAVENOUS
  Filled 2018-03-02: qty 15

## 2018-03-02 MED ORDER — PALONOSETRON HCL INJECTION 0.25 MG/5ML
0.2500 mg | Freq: Once | INTRAVENOUS | Status: AC
  Administered 2018-03-02: 0.25 mg via INTRAVENOUS

## 2018-03-02 MED ORDER — LORAZEPAM 1 MG PO TABS
ORAL_TABLET | ORAL | Status: AC
  Filled 2018-03-02: qty 1

## 2018-03-02 MED ORDER — DEXAMETHASONE SODIUM PHOSPHATE 10 MG/ML IJ SOLN
INTRAMUSCULAR | Status: AC
  Filled 2018-03-02: qty 1

## 2018-03-02 MED ORDER — SODIUM CHLORIDE 0.9 % IV SOLN
2400.0000 mg/m2 | INTRAVENOUS | Status: DC
  Administered 2018-03-02: 4500 mg via INTRAVENOUS
  Filled 2018-03-02: qty 90

## 2018-03-02 MED ORDER — LORAZEPAM 1 MG PO TABS
1.0000 mg | ORAL_TABLET | Freq: Once | ORAL | Status: AC
  Administered 2018-03-02: 1 mg via ORAL

## 2018-03-02 MED ORDER — SODIUM CHLORIDE 0.9 % IV SOLN
5.0000 mg/kg | Freq: Once | INTRAVENOUS | Status: AC
  Administered 2018-03-02: 350 mg via INTRAVENOUS
  Filled 2018-03-02: qty 14

## 2018-03-02 MED ORDER — SODIUM CHLORIDE 0.9 % IJ SOLN
10.0000 mL | INTRAMUSCULAR | Status: DC | PRN
  Administered 2018-03-02: 10 mL via INTRAVENOUS
  Filled 2018-03-02: qty 10

## 2018-03-02 MED ORDER — PALONOSETRON HCL INJECTION 0.25 MG/5ML
INTRAVENOUS | Status: AC
  Filled 2018-03-02: qty 5

## 2018-03-02 NOTE — Telephone Encounter (Signed)
Scheduled appt per 9/11 los - gave patient aVS and calender per los - central radiology to contact patient with scan

## 2018-03-02 NOTE — Patient Instructions (Signed)
[facility] Discharge Instructions for Patients Receiving Chemotherapy  Today you received the following chemotherapy agents avastin/leucovorin/florouracil   To help prevent nausea and vomiting after your treatment, we encourage you to take your nausea medication as directed  If you develop nausea and vomiting that is not controlled by your nausea medication, call the clinic.   BELOW ARE SYMPTOMS THAT SHOULD BE REPORTED IMMEDIATELY:  *FEVER GREATER THAN 100.5 F  *CHILLS WITH OR WITHOUT FEVER  NAUSEA AND VOMITING THAT IS NOT CONTROLLED WITH YOUR NAUSEA MEDICATION  *UNUSUAL SHORTNESS OF BREATH  *UNUSUAL BRUISING OR BLEEDING  TENDERNESS IN MOUTH AND THROAT WITH OR WITHOUT PRESENCE OF ULCERS  *URINARY PROBLEMS  *BOWEL PROBLEMS  UNUSUAL RASH Items with * indicate a potential emergency and should be followed up as soon as possible.  Feel free to call the clinic you have any questions or concerns. The clinic phone number is [phone]. 3

## 2018-03-02 NOTE — Progress Notes (Signed)
Hematology and Oncology Follow Up Visit  Margaret Elliott [id number] [birth year]    03/02/18   Principle Diagnosis: 67 year old woman with T3N1 colon cancer diagnosed in June 2014.  She subsequently developed stage IV disease with peritoneal metastasis and liver metastasis. Her tumor is microsatellite stable with NRAS mutation.    Prior Therapy:  She is status post laparoscopic laparotomy right hemicolectomy with ileocolonic anastomosis done on December 09, 2012.   FOLFOX and a Avastin chemotherapy started 01/18/2013. She is S/P 12 cycles completed in 05/2013.   She is a status post microwave ablation of metastatic lesion in the posterior segment of the right lobe of the liver completed on 09/28/2014 and repeated on 11/30/2014.  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  FOLFIRI and a Avastin salvage therapy started on 12/10/2015.  Current therapy:     Maintenance 5-FU, leucovorin and a Avastin every 4 weeks.   Interim History:  Margaret Elliott is here for a follow-up visit.  She continues to receive maintenance chemotherapy without any recent complications.  Since her last visit she has been complaining of intermittent left-sided abdominal discomfort at nighttime.  Usually lasts for few minutes and subsequently subsides.  Is not associated with any nausea, vomiting or weight loss.  She denies any diarrhea.  Her activity level and performance status is unchanged.  She continues to enjoy excellent quality of life.   She reports no headaches, blurry vision, syncope, syncope or seizures.  She denies any alteration in mental status or confusion.  She does not report any fevers, chills or sweats.  She does not report any chest pain, palpitation or orthopnea.  Denies any leg edema or dyspnea on exertion.  She does not report any wheezing, hemoptysis.    He denies any hematochezia or melena.  She denies any changes in her bowel  habits.  She does not report any frequency urgency or hesitancy. Does not report any easy bruising or petechiae.  She does not report any arthralgias or myalgias.  She does not report any lymphadenopathy or petechiae.  Rest of her review of systems is negative.   Medications: I have reviewed the patient's current medications.  Current Outpatient Prescriptions  Medication Sig Dispense Refill  . acetaminophen (TYLENOL) 500 MG tablet Take 500 mg by mouth every 6 (six) hours as needed for pain.      . diphenhydrAMINE (BENADRYL) 25 mg capsule Take 25-50 mg by mouth daily as needed for itching. For allergic reaction      . lidocaine-prilocaine (EMLA) cream Apply 1 application topically as needed. Apply approx 1/2 tsp to skin over port, prior to chemotherapy treatments  1 kit  3  . Multiple Vitamin (MULTIVITAMIN) tablet Take 1 tablet by mouth daily.      . ondansetron (ZOFRAN) 8 MG tablet Take 1 tablet (8 mg total) by mouth every 8 (eight) hours as needed for nausea.  30 tablet  1   No current facility-administered medications for this visit.     Allergies: No Known Allergies  Past Medical History, Surgical history, Social history, and Family History reviewed today and remain unchanged.    Physical Exam: Blood pressure (!) 141/87, pulse 80, temperature 97.9 F (36.6 C), temperature source Oral, resp. rate 18, height 5' 4" (1.626 m), weight 156 lb 4.8 oz (70.9 kg), SpO2 100 %.     ECOG: 0    General appearance: Comfortable appearing without any discomfort Head: Normocephalic  without any trauma Oropharynx: Mucous membranes are moist and pink without any thrush or ulcers. Eyes: Pupils are equal and round reactive to light. Lymph nodes: No cervical, supraclavicular, inguinal or axillary lymphadenopathy.   Heart:regular rate and rhythm.  S1 and S2 without leg edema. Lung: Clear without any rhonchi or wheezes.  No dullness to percussion. Abdomin: Soft, very slight tenderness on deep  palpation.  Nondistended with good bowel sounds.  No hepatosplenomegaly.  No shifting dullness or ascites. Musculoskeletal: No joint deformity or effusion.  Full range of motion noted. Neurological: No deficits noted on motor, sensory and deep tendon reflex exam. Skin: No petechial rash or dryness.  Appeared moist.     CBC    Component Value Date/Time   WBC 3.6 (L) 02/02/2018 0926   WBC 4.2 09/08/2017 0834   RBC 4.11 02/02/2018 0926   HGB 13.1 02/02/2018 0926   HGB 12.3 05/26/2017 0809   HCT 39.3 02/02/2018 0926   HCT 37.6 05/26/2017 0809   PLT 226 02/02/2018 0926   PLT 180 05/26/2017 0809   MCV 95.7 02/02/2018 0926   MCV 100.3 05/26/2017 0809   MCH 31.9 02/02/2018 0926   MCHC 33.3 02/02/2018 0926   RDW 15.0 (H) 02/02/2018 0926   RDW 17.4 (H) 05/26/2017 0809   LYMPHSABS 1.5 02/02/2018 0926   LYMPHSABS 1.2 05/26/2017 0809   MONOABS 0.4 02/02/2018 0926   MONOABS 0.5 05/26/2017 0809   EOSABS 0.1 02/02/2018 0926   EOSABS 0.1 05/26/2017 0809   BASOSABS 0.0 02/02/2018 0926   BASOSABS 0.0 05/26/2017 0809    Results for Margaret Elliott, Margaret Elliott (MRN [id number]) as of 03/02/2018 08:53  Ref. Range 08/11/2017 09:18 08/25/2017 08:45 09/08/2017 08:34 10/06/2017 08:10 11/10/2017 09:11 12/08/2017 08:34 01/05/2018 11:19 02/02/2018 09:26  [facility] ([facility]) Latest Ref Range: 0.00 - 5.00 ng/mL 3.44 3.25 3.32 5.04 (H) 5.12 (H) 4.75 5.35 (H) 5.72 (H)    Impression and Plan:  67 year old woman with:   1.  Colon cancer with metastatic disease to the peritoneum as well as the liver diagnosed in 2014.  She initially had a T3 N1 disease and subsequently developed peritoneal metastasis.    She is currently on 5-FU, leucovorin and Avastin for maintenance purposes and has tolerated therapy reasonably well.  Risks and benefits of continuing since therapy was reviewed today and she is agreeable to continue.  Given her recent abdominal discomfort I will obtain imaging studies before the next visit to ensure disease  stability.  The natural course of this disease as well as long-term complication associated with this therapy was reviewed to alternative options if she developed progression of disease.  2.  Cather management: Port-A-Cath remains in use without any issues.   3. Neuropathy: Remains stable at this time and not interfering with function.  4.  Abdominal discomfort: Her CT scan obtained in June 2019 was personally reviewed and discussed with her today.  She has complaints of mild abdominal discomfort that is likely related to scar tissue but we have to evaluate for cancer progression.  We will obtain imaging studies for the next visit.  5.  Anxiety and insomnia: No issues reported since the last visit.  Appears stable.  6.  Prognosis: Her performance status remains excellent and aggressive therapy is warranted.  7. Followup: Will be in 4 weeks to continue maintenance chemotherapy.  25 minutes spent today face-to-face with the patient.  More than 50% of today's visit was spent reviewing imaging studies, discussing the natural course of her disease in  coordinating her plan of care.   Zola Button MD 03/02/18

## 2018-03-03 ENCOUNTER — Telehealth: Payer: Self-pay | Admitting: *Deleted

## 2018-03-03 NOTE — Telephone Encounter (Signed)
"  I called about Pump alarming.'High Pressure'.  Told me to turn it off and contact office."  Instructed to come  In now, and go directly to infusion room.Marland Kitchen

## 2018-03-03 NOTE — Progress Notes (Signed)
Pt called in with concern regarding pump reading "occlusion". Called manufacturer and instructed how to stop pump and to come in to have it looked at. Pump checked for kinks in the line, clamps closed, or improper connection between IV line and pump. No issues noted. Proximal port flushed and blood return noted. Pump turned on and started. 64.52mL remaining. Pt stayed until 63.95mL remaining. No other complaints. Pt discharged. Pump running appropriately.

## 2018-03-04 ENCOUNTER — Inpatient Hospital Stay: Payer: Medicare Other

## 2018-03-04 VITALS — BP 130/90 | HR 72 | Temp 97.7°F | Resp 18

## 2018-03-04 DIAGNOSIS — Z5112 Encounter for antineoplastic immunotherapy: Secondary | ICD-10-CM | POA: Diagnosis not present

## 2018-03-04 DIAGNOSIS — C189 Malignant neoplasm of colon, unspecified: Secondary | ICD-10-CM

## 2018-03-04 DIAGNOSIS — K769 Liver disease, unspecified: Secondary | ICD-10-CM

## 2018-03-04 DIAGNOSIS — G629 Polyneuropathy, unspecified: Secondary | ICD-10-CM | POA: Diagnosis not present

## 2018-03-04 DIAGNOSIS — C787 Secondary malignant neoplasm of liver and intrahepatic bile duct: Secondary | ICD-10-CM | POA: Diagnosis not present

## 2018-03-04 DIAGNOSIS — K6389 Other specified diseases of intestine: Secondary | ICD-10-CM

## 2018-03-04 DIAGNOSIS — C786 Secondary malignant neoplasm of retroperitoneum and peritoneum: Secondary | ICD-10-CM | POA: Diagnosis not present

## 2018-03-04 DIAGNOSIS — Z5111 Encounter for antineoplastic chemotherapy: Secondary | ICD-10-CM | POA: Diagnosis not present

## 2018-03-04 MED ORDER — HEPARIN SOD (PORK) LOCK FLUSH 100 UNIT/ML IV SOLN
500.0000 [IU] | Freq: Once | INTRAVENOUS | Status: AC | PRN
  Administered 2018-03-04: 500 [IU]
  Filled 2018-03-04: qty 5

## 2018-03-04 MED ORDER — SODIUM CHLORIDE 0.9% FLUSH
10.0000 mL | INTRAVENOUS | Status: DC | PRN
  Administered 2018-03-04: 10 mL
  Filled 2018-03-04: qty 10

## 2018-03-19 DIAGNOSIS — Z23 Encounter for immunization: Secondary | ICD-10-CM | POA: Diagnosis not present

## 2018-03-30 ENCOUNTER — Inpatient Hospital Stay: Payer: Medicare Other

## 2018-03-30 ENCOUNTER — Other Ambulatory Visit: Payer: Self-pay | Admitting: *Deleted

## 2018-03-30 ENCOUNTER — Inpatient Hospital Stay (HOSPITAL_BASED_OUTPATIENT_CLINIC_OR_DEPARTMENT_OTHER): Payer: Medicare Other | Admitting: Oncology

## 2018-03-30 ENCOUNTER — Telehealth: Payer: Self-pay | Admitting: Oncology

## 2018-03-30 ENCOUNTER — Inpatient Hospital Stay: Payer: Medicare Other | Attending: Oncology

## 2018-03-30 VITALS — BP 153/93 | HR 77 | Temp 98.1°F | Resp 17 | Ht 64.0 in | Wt 155.3 lb

## 2018-03-30 DIAGNOSIS — Z79899 Other long term (current) drug therapy: Secondary | ICD-10-CM | POA: Diagnosis not present

## 2018-03-30 DIAGNOSIS — G629 Polyneuropathy, unspecified: Secondary | ICD-10-CM | POA: Diagnosis not present

## 2018-03-30 DIAGNOSIS — F419 Anxiety disorder, unspecified: Secondary | ICD-10-CM | POA: Insufficient documentation

## 2018-03-30 DIAGNOSIS — R109 Unspecified abdominal pain: Secondary | ICD-10-CM | POA: Insufficient documentation

## 2018-03-30 DIAGNOSIS — C787 Secondary malignant neoplasm of liver and intrahepatic bile duct: Secondary | ICD-10-CM

## 2018-03-30 DIAGNOSIS — C786 Secondary malignant neoplasm of retroperitoneum and peritoneum: Secondary | ICD-10-CM

## 2018-03-30 DIAGNOSIS — Z95828 Presence of other vascular implants and grafts: Secondary | ICD-10-CM

## 2018-03-30 DIAGNOSIS — K6389 Other specified diseases of intestine: Secondary | ICD-10-CM

## 2018-03-30 DIAGNOSIS — C189 Malignant neoplasm of colon, unspecified: Secondary | ICD-10-CM | POA: Insufficient documentation

## 2018-03-30 DIAGNOSIS — K769 Liver disease, unspecified: Secondary | ICD-10-CM

## 2018-03-30 DIAGNOSIS — Z5111 Encounter for antineoplastic chemotherapy: Secondary | ICD-10-CM | POA: Insufficient documentation

## 2018-03-30 DIAGNOSIS — Z5112 Encounter for antineoplastic immunotherapy: Secondary | ICD-10-CM | POA: Diagnosis not present

## 2018-03-30 LAB — CBC WITH DIFFERENTIAL (CANCER CENTER ONLY)
Abs Immature Granulocytes: 0 10*3/uL (ref 0.00–0.07)
BASOS ABS: 0 10*3/uL (ref 0.0–0.1)
Basophils Relative: 1 %
EOS ABS: 0.1 10*3/uL (ref 0.0–0.5)
EOS PCT: 2 %
HEMATOCRIT: 39.8 % (ref 36.0–46.0)
Hemoglobin: 13.3 g/dL (ref 12.0–15.0)
Immature Granulocytes: 0 %
LYMPHS ABS: 1.4 10*3/uL (ref 0.7–4.0)
Lymphocytes Relative: 34 %
MCH: 31.9 pg (ref 26.0–34.0)
MCHC: 33.4 g/dL (ref 30.0–36.0)
MCV: 95.4 fL (ref 80.0–100.0)
Monocytes Absolute: 0.4 10*3/uL (ref 0.1–1.0)
Monocytes Relative: 10 %
NRBC: 0 % (ref 0.0–0.2)
Neutro Abs: 2.3 10*3/uL (ref 1.7–7.7)
Neutrophils Relative %: 53 %
Platelet Count: 220 10*3/uL (ref 150–400)
RBC: 4.17 MIL/uL (ref 3.87–5.11)
RDW: 14.8 % (ref 11.5–15.5)
WBC: 4.3 10*3/uL (ref 4.0–10.5)

## 2018-03-30 LAB — CMP (CANCER CENTER ONLY)
ALBUMIN: 3.5 g/dL (ref 3.5–5.0)
ALT: 12 U/L (ref 0–44)
ANION GAP: 9 (ref 5–15)
AST: 17 U/L (ref 15–41)
Alkaline Phosphatase: 81 U/L (ref 38–126)
BILIRUBIN TOTAL: 0.7 mg/dL (ref 0.3–1.2)
BUN: 17 mg/dL (ref 8–23)
CO2: 24 mmol/L (ref 22–32)
Calcium: 9.6 mg/dL (ref 8.9–10.3)
Chloride: 109 mmol/L (ref 98–111)
Creatinine: 0.91 mg/dL (ref 0.44–1.00)
GFR, Est AFR Am: >60 mL/min (ref >60)
GFR, Estimated: >60 mL/min (ref >60)
GLUCOSE: 106 mg/dL — AB (ref 70–99)
POTASSIUM: 3.8 mmol/L (ref 3.5–5.1)
Sodium: 142 mmol/L (ref 135–145)
TOTAL PROTEIN: 7.6 g/dL (ref 6.5–8.1)

## 2018-03-30 LAB — CEA (IN HOUSE-CHCC): CEA (CHCC-IN HOUSE): 6.06 ng/mL — AB (ref 0.00–5.00)

## 2018-03-30 MED ORDER — SODIUM CHLORIDE 0.9 % IJ SOLN
10.0000 mL | INTRAMUSCULAR | Status: DC | PRN
  Administered 2018-03-30: 10 mL via INTRAVENOUS
  Filled 2018-03-30: qty 10

## 2018-03-30 MED ORDER — SODIUM CHLORIDE 0.9 % IV SOLN
5.0000 mg/kg | Freq: Once | INTRAVENOUS | Status: AC
  Administered 2018-03-30: 350 mg via INTRAVENOUS
  Filled 2018-03-30: qty 14

## 2018-03-30 MED ORDER — DEXAMETHASONE SODIUM PHOSPHATE 10 MG/ML IJ SOLN
10.0000 mg | Freq: Once | INTRAMUSCULAR | Status: AC
  Administered 2018-03-30: 10 mg via INTRAVENOUS

## 2018-03-30 MED ORDER — DEXAMETHASONE SODIUM PHOSPHATE 10 MG/ML IJ SOLN
INTRAMUSCULAR | Status: AC
  Filled 2018-03-30: qty 1

## 2018-03-30 MED ORDER — LEUCOVORIN CALCIUM INJECTION 350 MG
400.0000 mg/m2 | Freq: Once | INTRAVENOUS | Status: AC
  Administered 2018-03-30: 752 mg via INTRAVENOUS
  Filled 2018-03-30: qty 37.6

## 2018-03-30 MED ORDER — LORAZEPAM 1 MG PO TABS
ORAL_TABLET | ORAL | Status: AC
  Filled 2018-03-30: qty 1

## 2018-03-30 MED ORDER — PALONOSETRON HCL INJECTION 0.25 MG/5ML
0.2500 mg | Freq: Once | INTRAVENOUS | Status: AC
  Administered 2018-03-30: 0.25 mg via INTRAVENOUS

## 2018-03-30 MED ORDER — HEPARIN SOD (PORK) LOCK FLUSH 100 UNIT/ML IV SOLN
500.0000 [IU] | Freq: Once | INTRAVENOUS | Status: DC | PRN
  Filled 2018-03-30: qty 5

## 2018-03-30 MED ORDER — FLUOROURACIL CHEMO INJECTION 2.5 GM/50ML
400.0000 mg/m2 | Freq: Once | INTRAVENOUS | Status: AC
  Administered 2018-03-30: 750 mg via INTRAVENOUS
  Filled 2018-03-30: qty 15

## 2018-03-30 MED ORDER — SODIUM CHLORIDE 0.9 % IV SOLN
2400.0000 mg/m2 | INTRAVENOUS | Status: DC
  Administered 2018-03-30: 4500 mg via INTRAVENOUS
  Filled 2018-03-30: qty 90

## 2018-03-30 MED ORDER — HYDROCODONE-ACETAMINOPHEN 5-325 MG PO TABS: 1.0000 | ORAL_TABLET | ORAL | 0 refills | Status: DC | PRN

## 2018-03-30 MED ORDER — SODIUM CHLORIDE 0.9% FLUSH
10.0000 mL | INTRAVENOUS | Status: DC | PRN
  Filled 2018-03-30: qty 10

## 2018-03-30 MED ORDER — SODIUM CHLORIDE 0.9 % IV SOLN
Freq: Once | INTRAVENOUS | Status: AC
  Administered 2018-03-30: 09:00:00 via INTRAVENOUS
  Filled 2018-03-30: qty 250

## 2018-03-30 MED ORDER — PALONOSETRON HCL INJECTION 0.25 MG/5ML
INTRAVENOUS | Status: AC
  Filled 2018-03-30: qty 5

## 2018-03-30 MED ORDER — LORAZEPAM 1 MG PO TABS
1.0000 mg | ORAL_TABLET | Freq: Once | ORAL | Status: AC
  Administered 2018-03-30: 1 mg via ORAL

## 2018-03-30 NOTE — Progress Notes (Signed)
Hematology and Oncology Follow Up Visit  CORSICA FRANSON [id number] 1950-11-10    03/30/18   Principle Diagnosis: 67 year old woman with colon cancer diagnosed in June 2014 subsequently developed stage IV disease with peritoneal metastasis and liver metastasis. Her tumor is microsatellite stable with NRAS mutation.    Prior Therapy:  She is status post laparoscopic laparotomy right hemicolectomy with ileocolonic anastomosis done on December 09, 2012.   FOLFOX and a Avastin chemotherapy started 01/18/2013. She is S/P 12 cycles completed in 05/2013.   She is a status post microwave ablation of metastatic lesion in the posterior segment of the right lobe of the liver completed on 09/28/2014 and repeated on 11/30/2014.  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  FOLFIRI and a Avastin salvage therapy started on 12/10/2015.  Current therapy:     Maintenance 5-FU, leucovorin and a Avastin every 4 weeks.   Interim History:  Ms. Sacca returns today for repeat evaluation.  Since last visit, she tolerated chemotherapy reasonably well without any complaints.  He denied any excessive nausea, vomiting or abdominal discomfort.  He does report some mild queasiness and discomfort that resolves spontaneously.  She is able to eats well and perform activities of daily living.  His performance status and quality of life remains unchanged.  She denies any rectal bleeding or infusion related complications.  She reports no headaches, blurry vision, syncope, syncope or seizures.  She denies any dizziness or lethargy..  She does not report any fevers, chills or sweats.  She does not report any chest pain, palpitation or orthopnea.  Denies any leg edema or dyspnea on exertion.  She does not report any wheezing, hemoptysis.    He denies any constipation or diarrhea.  She does not report any frequency urgency or hesitancy. Does not report any  easy bruising or petechiae.  She does not report any bone pain or discomfort..  She does not report any easy bruising.Marland Kitchen  Rest of her review of systems is negative.   Medications: I have reviewed the patient's current medications.  Current Outpatient Prescriptions  Medication Sig Dispense Refill  . acetaminophen (TYLENOL) 500 MG tablet Take 500 mg by mouth every 6 (six) hours as needed for pain.      . diphenhydrAMINE (BENADRYL) 25 mg capsule Take 25-50 mg by mouth daily as needed for itching. For allergic reaction      . lidocaine-prilocaine (EMLA) cream Apply 1 application topically as needed. Apply approx 1/2 tsp to skin over port, prior to chemotherapy treatments  1 kit  3  . Multiple Vitamin (MULTIVITAMIN) tablet Take 1 tablet by mouth daily.      . ondansetron (ZOFRAN) 8 MG tablet Take 1 tablet (8 mg total) by mouth every 8 (eight) hours as needed for nausea.  30 tablet  1   No current facility-administered medications for this visit.     Allergies: No Known Allergies  Past Medical History, Surgical history, Social history, and Family History reviewed today and remain unchanged.    Physical Exam: Blood pressure (!) 153/93, pulse 77, temperature 98.1 F (36.7 C), temperature source Oral, resp. rate 17, height '5\' 4"'$  (1.626 m), weight 155 lb 4.8 oz (70.4 kg), SpO2 99 %.     ECOG: 0   General appearance: Alert, awake without any distress. Head: Atraumatic without abnormalities Oropharynx: Without any thrush or ulcers. Eyes: No scleral icterus. Lymph nodes: No lymphadenopathy noted in the cervical, supraclavicular,  or axillary nodes Heart:regular rate and rhythm, without any murmurs or gallops.   Lung: Clear to auscultation without any rhonchi, wheezes or dullness to percussion. Abdomin: Soft, nontender without any shifting dullness or ascites. Musculoskeletal: No clubbing or cyanosis. Neurological: No motor or sensory deficits. Skin: No rashes or lesions. Psychiatric: Mood  and affect appeared normal.     CBC    Component Value Date/Time   WBC 4.3 03/30/2018 0749   WBC 4.2 09/08/2017 0834   RBC 4.17 03/30/2018 0749   HGB 13.3 03/30/2018 0749   HGB 12.3 05/26/2017 0809   HCT 39.8 03/30/2018 0749   HCT 37.6 05/26/2017 0809   PLT 220 03/30/2018 0749   PLT 180 05/26/2017 0809   MCV 95.4 03/30/2018 0749   MCV 100.3 05/26/2017 0809   MCH 31.9 03/30/2018 0749   MCHC 33.4 03/30/2018 0749   RDW 14.8 03/30/2018 0749   RDW 17.4 (H) 05/26/2017 0809   LYMPHSABS 1.4 03/30/2018 0749   LYMPHSABS 1.2 05/26/2017 0809   MONOABS 0.4 03/30/2018 0749   MONOABS 0.5 05/26/2017 0809   EOSABS 0.1 03/30/2018 0749   EOSABS 0.1 05/26/2017 0809   BASOSABS 0.0 03/30/2018 0749   BASOSABS 0.0 05/26/2017 0809     Results for CAPUCINE, TRYON (MRN [id number]) as of 03/30/2018 08:24  Ref. Range 02/02/2018 09:26 03/02/2018 08:23  CEA ([facility]) Latest Ref Range: 0.00 - 5.00 ng/mL 5.72 (H) 6.30 (H)    Impression and Plan:  67 year old woman with:   1.  Stage IV colon cancer with disease to the peritoneum.   She has limited disease at this time and remains on maintenance chemotherapy utilizing 5-FU and a Avastin.  The natural course of her disease was reviewed today and treatment options were discussed.  At this time the plan is to continue with the maintenance chemotherapy and repeat imaging studies in November 2019.  Different salvage therapy may be needed if she has progression of disease.  2.  Cather management: Port-A-Cath has been utilized without any issues or pain.   3. Neuropathy: No changes at this time reported.  4.  Abdominal discomfort: Very little noted at this time and overall improved.  She will have repeat imaging studies in the near future.  She does use hydrocodone very intermittently to control the pain.  5.  Anxiety: Her mood is stable but she remains anxious about her diagnosis and overall prognosis.  6.  Prognosis: Her performance status remain  excellent and aggressive therapy is warranted.  7. Followup: Will be in 4 weeks for repeat chemotherapy and discussing her imaging studies.  15 minutes spent today face-to-face with the patient.  More than 50% of today's visit was dedicated to reviewing the natural course of her disease, treatment options and coordinating plan of care.   Zola Button MD 03/30/18

## 2018-03-30 NOTE — Patient Instructions (Signed)
[facility] Discharge Instructions for Patients Receiving Chemotherapy  Today you received the following chemotherapy agents Avastin,Leucovorin,Adrucil  To help prevent nausea and vomiting after your treatment, we encourage you to take your nausea medication as directed  If you develop nausea and vomiting that is not controlled by your nausea medication, call the clinic.   BELOW ARE SYMPTOMS THAT SHOULD BE REPORTED IMMEDIATELY:  *FEVER GREATER THAN 100.5 F  *CHILLS WITH OR WITHOUT FEVER  NAUSEA AND VOMITING THAT IS NOT CONTROLLED WITH YOUR NAUSEA MEDICATION  *UNUSUAL SHORTNESS OF BREATH  *UNUSUAL BRUISING OR BLEEDING  TENDERNESS IN MOUTH AND THROAT WITH OR WITHOUT PRESENCE OF ULCERS  *URINARY PROBLEMS  *BOWEL PROBLEMS  UNUSUAL RASH Items with * indicate a potential emergency and should be followed up as soon as possible.  Feel free to call the clinic should you have any questions or concerns. The clinic phone number is [phone].  Please show the CHEMO ALERT CARD at check-in to the Emergency Department and triage nurse.   

## 2018-03-30 NOTE — Telephone Encounter (Signed)
Scheduled appt per 10/9 los - gave patient AVS and calender per los.   

## 2018-04-01 ENCOUNTER — Inpatient Hospital Stay: Payer: Medicare Other

## 2018-04-01 DIAGNOSIS — C189 Malignant neoplasm of colon, unspecified: Secondary | ICD-10-CM

## 2018-04-01 DIAGNOSIS — C787 Secondary malignant neoplasm of liver and intrahepatic bile duct: Secondary | ICD-10-CM | POA: Diagnosis not present

## 2018-04-01 DIAGNOSIS — K769 Liver disease, unspecified: Secondary | ICD-10-CM

## 2018-04-01 DIAGNOSIS — K6389 Other specified diseases of intestine: Secondary | ICD-10-CM

## 2018-04-01 DIAGNOSIS — Z5111 Encounter for antineoplastic chemotherapy: Secondary | ICD-10-CM | POA: Diagnosis not present

## 2018-04-01 DIAGNOSIS — Z5112 Encounter for antineoplastic immunotherapy: Secondary | ICD-10-CM | POA: Diagnosis not present

## 2018-04-01 DIAGNOSIS — C786 Secondary malignant neoplasm of retroperitoneum and peritoneum: Secondary | ICD-10-CM | POA: Diagnosis not present

## 2018-04-01 DIAGNOSIS — G629 Polyneuropathy, unspecified: Secondary | ICD-10-CM | POA: Diagnosis not present

## 2018-04-01 MED ORDER — SODIUM CHLORIDE 0.9% FLUSH
10.0000 mL | INTRAVENOUS | Status: DC | PRN
  Administered 2018-04-01: 10 mL
  Filled 2018-04-01: qty 10

## 2018-04-01 MED ORDER — HEPARIN SOD (PORK) LOCK FLUSH 100 UNIT/ML IV SOLN
500.0000 [IU] | Freq: Once | INTRAVENOUS | Status: AC | PRN
  Administered 2018-04-01: 500 [IU]
  Filled 2018-04-01: qty 5

## 2018-04-27 ENCOUNTER — Inpatient Hospital Stay (HOSPITAL_BASED_OUTPATIENT_CLINIC_OR_DEPARTMENT_OTHER): Payer: Medicare Other | Admitting: Oncology

## 2018-04-27 ENCOUNTER — Inpatient Hospital Stay: Payer: Medicare Other

## 2018-04-27 ENCOUNTER — Inpatient Hospital Stay: Payer: Medicare Other | Attending: Oncology

## 2018-04-27 VITALS — BP 134/94 | HR 77 | Temp 98.0°F | Resp 17 | Ht 64.0 in | Wt 158.1 lb

## 2018-04-27 VITALS — BP 128/72 | HR 90 | Temp 97.4°F | Resp 17

## 2018-04-27 DIAGNOSIS — K6389 Other specified diseases of intestine: Secondary | ICD-10-CM

## 2018-04-27 DIAGNOSIS — R110 Nausea: Secondary | ICD-10-CM | POA: Diagnosis not present

## 2018-04-27 DIAGNOSIS — G629 Polyneuropathy, unspecified: Secondary | ICD-10-CM

## 2018-04-27 DIAGNOSIS — C189 Malignant neoplasm of colon, unspecified: Secondary | ICD-10-CM

## 2018-04-27 DIAGNOSIS — Z95828 Presence of other vascular implants and grafts: Secondary | ICD-10-CM

## 2018-04-27 DIAGNOSIS — F419 Anxiety disorder, unspecified: Secondary | ICD-10-CM | POA: Diagnosis not present

## 2018-04-27 DIAGNOSIS — C786 Secondary malignant neoplasm of retroperitoneum and peritoneum: Secondary | ICD-10-CM

## 2018-04-27 DIAGNOSIS — Z79899 Other long term (current) drug therapy: Secondary | ICD-10-CM | POA: Diagnosis not present

## 2018-04-27 DIAGNOSIS — Z5111 Encounter for antineoplastic chemotherapy: Secondary | ICD-10-CM | POA: Insufficient documentation

## 2018-04-27 DIAGNOSIS — K769 Liver disease, unspecified: Secondary | ICD-10-CM

## 2018-04-27 DIAGNOSIS — C787 Secondary malignant neoplasm of liver and intrahepatic bile duct: Secondary | ICD-10-CM | POA: Insufficient documentation

## 2018-04-27 LAB — CBC WITH DIFFERENTIAL (CANCER CENTER ONLY)
Abs Immature Granulocytes: 0.01 10*3/uL (ref 0.00–0.07)
BASOS ABS: 0 10*3/uL (ref 0.0–0.1)
BASOS PCT: 0 %
EOS ABS: 0.1 10*3/uL (ref 0.0–0.5)
Eosinophils Relative: 1 %
HCT: 39.3 % (ref 36.0–46.0)
Hemoglobin: 12.6 g/dL (ref 12.0–15.0)
IMMATURE GRANULOCYTES: 0 %
Lymphocytes Relative: 30 %
Lymphs Abs: 1.7 10*3/uL (ref 0.7–4.0)
MCH: 31.6 pg (ref 26.0–34.0)
MCHC: 32.1 g/dL (ref 30.0–36.0)
MCV: 98.5 fL (ref 80.0–100.0)
Monocytes Absolute: 0.5 10*3/uL (ref 0.1–1.0)
Monocytes Relative: 9 %
NEUTROS PCT: 60 %
NRBC: 0 % (ref 0.0–0.2)
Neutro Abs: 3.2 10*3/uL (ref 1.7–7.7)
PLATELETS: 264 10*3/uL (ref 150–400)
RBC: 3.99 MIL/uL (ref 3.87–5.11)
RDW: 14.6 % (ref 11.5–15.5)
WBC: 5.5 10*3/uL (ref 4.0–10.5)

## 2018-04-27 LAB — CMP (CANCER CENTER ONLY)
ALBUMIN: 3.5 g/dL (ref 3.5–5.0)
ALT: 14 U/L (ref 0–44)
AST: 18 U/L (ref 15–41)
Alkaline Phosphatase: 91 U/L (ref 38–126)
Anion gap: 7 (ref 5–15)
BUN: 17 mg/dL (ref 8–23)
CHLORIDE: 106 mmol/L (ref 98–111)
CO2: 24 mmol/L (ref 22–32)
CREATININE: 0.87 mg/dL (ref 0.44–1.00)
Calcium: 9.2 mg/dL (ref 8.9–10.3)
GFR, Est AFR Am: >60 mL/min (ref >60)
GLUCOSE: 94 mg/dL (ref 70–99)
Potassium: 4.1 mmol/L (ref 3.5–5.1)
Sodium: 137 mmol/L (ref 135–145)
Total Bilirubin: 0.5 mg/dL (ref 0.3–1.2)
Total Protein: 7.4 g/dL (ref 6.5–8.1)

## 2018-04-27 LAB — CEA (IN HOUSE-CHCC): CEA (CHCC-In House): 8.71 ng/mL — ABNORMAL HIGH (ref 0.00–5.00)

## 2018-04-27 MED ORDER — LORAZEPAM 1 MG PO TABS
ORAL_TABLET | ORAL | Status: AC
  Filled 2018-04-27: qty 1

## 2018-04-27 MED ORDER — PALONOSETRON HCL INJECTION 0.25 MG/5ML
0.2500 mg | Freq: Once | INTRAVENOUS | Status: AC
  Administered 2018-04-27: 0.25 mg via INTRAVENOUS

## 2018-04-27 MED ORDER — DEXAMETHASONE SODIUM PHOSPHATE 10 MG/ML IJ SOLN
INTRAMUSCULAR | Status: AC
  Filled 2018-04-27: qty 1

## 2018-04-27 MED ORDER — DEXAMETHASONE SODIUM PHOSPHATE 10 MG/ML IJ SOLN
10.0000 mg | Freq: Once | INTRAMUSCULAR | Status: AC
  Administered 2018-04-27: 10 mg via INTRAVENOUS

## 2018-04-27 MED ORDER — SODIUM CHLORIDE 0.9 % IV SOLN
5.0000 mg/kg | Freq: Once | INTRAVENOUS | Status: AC
  Administered 2018-04-27: 350 mg via INTRAVENOUS
  Filled 2018-04-27: qty 14

## 2018-04-27 MED ORDER — FLUOROURACIL CHEMO INJECTION 2.5 GM/50ML
400.0000 mg/m2 | Freq: Once | INTRAVENOUS | Status: AC
  Administered 2018-04-27: 750 mg via INTRAVENOUS
  Filled 2018-04-27: qty 15

## 2018-04-27 MED ORDER — LEUCOVORIN CALCIUM INJECTION 350 MG
400.0000 mg/m2 | Freq: Once | INTRAVENOUS | Status: AC
  Administered 2018-04-27: 752 mg via INTRAVENOUS
  Filled 2018-04-27: qty 37.6

## 2018-04-27 MED ORDER — SODIUM CHLORIDE 0.9% FLUSH
10.0000 mL | INTRAVENOUS | Status: DC | PRN
  Filled 2018-04-27: qty 10

## 2018-04-27 MED ORDER — SODIUM CHLORIDE 0.9 % IJ SOLN
10.0000 mL | INTRAMUSCULAR | Status: DC | PRN
  Administered 2018-04-27: 10 mL via INTRAVENOUS
  Filled 2018-04-27: qty 10

## 2018-04-27 MED ORDER — SODIUM CHLORIDE 0.9 % IV SOLN
Freq: Once | INTRAVENOUS | Status: AC
  Administered 2018-04-27: 10:00:00 via INTRAVENOUS
  Filled 2018-04-27: qty 250

## 2018-04-27 MED ORDER — PALONOSETRON HCL INJECTION 0.25 MG/5ML
INTRAVENOUS | Status: AC
  Filled 2018-04-27: qty 5

## 2018-04-27 MED ORDER — LORAZEPAM 1 MG PO TABS
1.0000 mg | ORAL_TABLET | Freq: Once | ORAL | Status: AC
  Administered 2018-04-27: 1 mg via ORAL

## 2018-04-27 MED ORDER — HEPARIN SOD (PORK) LOCK FLUSH 100 UNIT/ML IV SOLN
500.0000 [IU] | Freq: Once | INTRAVENOUS | Status: DC | PRN
  Filled 2018-04-27: qty 5

## 2018-04-27 MED ORDER — SODIUM CHLORIDE 0.9 % IV SOLN
2400.0000 mg/m2 | INTRAVENOUS | Status: DC
  Administered 2018-04-27: 4500 mg via INTRAVENOUS
  Filled 2018-04-27: qty 90

## 2018-04-27 NOTE — Progress Notes (Signed)
Per Dr. Alen Blew, okay to treat with urine protein from 03/02/18

## 2018-04-27 NOTE — Patient Instructions (Signed)
[facility] Discharge Instructions for Patients Receiving Chemotherapy  Today you received the following chemotherapy agents: Avastin, Leucovorin, and 5FU  To help prevent nausea and vomiting after your treatment, we encourage you to take your nausea medication as directed   If you develop nausea and vomiting that is not controlled by your nausea medication, call the clinic.   BELOW ARE SYMPTOMS THAT SHOULD BE REPORTED IMMEDIATELY:  *FEVER GREATER THAN 100.5 F  *CHILLS WITH OR WITHOUT FEVER  NAUSEA AND VOMITING THAT IS NOT CONTROLLED WITH YOUR NAUSEA MEDICATION  *UNUSUAL SHORTNESS OF BREATH  *UNUSUAL BRUISING OR BLEEDING  TENDERNESS IN MOUTH AND THROAT WITH OR WITHOUT PRESENCE OF ULCERS  *URINARY PROBLEMS  *BOWEL PROBLEMS  UNUSUAL RASH Items with * indicate a potential emergency and should be followed up as soon as possible.  Feel free to call the clinic should you have any questions or concerns. The clinic phone number is [phone].  Please show the CHEMO ALERT CARD at check-in to the Emergency Department and triage nurse.   

## 2018-04-27 NOTE — Progress Notes (Signed)
Hematology and Oncology Follow Up Visit  Margaret Elliott [id number] 11-22-1950    04/27/18   Principle Diagnosis: 67 year old woman with stage IV colon cancer diagnosed in June 2014 with peritoneal metastasis and liver metastasis.  She was found to have microsatellite stable with NRAS mutation in her tumor.   Prior Therapy:  She is status post laparoscopic laparotomy right hemicolectomy with ileocolonic anastomosis done on December 09, 2012.   FOLFOX and a Avastin chemotherapy started 01/18/2013. She is S/P 12 cycles completed in 05/2013.   She is a status post microwave ablation of metastatic lesion in the posterior segment of the right lobe of the liver completed on 09/28/2014 and repeated on 11/30/2014.  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  FOLFIRI and a Avastin salvage therapy started on 12/10/2015.  Current therapy:     Maintenance 5-FU, leucovorin and a Avastin every 4 weeks.   Interim History:  Margaret Elliott presents today for a follow-up visit.  Since her last visit, she tolerated last cycle of chemotherapy without any new complaints.  She does report some mild nausea and takes Zofran which have helped her symptoms.  She denies any vomiting or changes in her bowel habits.  Her appetite remained reasonable and weight is maintained.  Her performance status and quality of life is unchanged.  She reports no headaches, blurry vision, syncope, syncope or seizures.  She denies any alteration in mental status or confusion.  She does not report any fevers, chills or sweats.  She does not report any chest pain, palpitation or orthopnea.  Denies any leg edema or dyspnea on exertion.  She does not report any wheezing, hemoptysis.    He denies any change in bowel habits.  She does not report any frequency urgency or hesitancy. Does not report any bleeding or clotting tendency.  She does not report any arthralgias or  myalgias.  She does not report any changes in her mood.  Rest of her review of systems is negative.   Medications: I have reviewed the patient's current medications.  Current Outpatient Prescriptions  Medication Sig Dispense Refill  . acetaminophen (TYLENOL) 500 MG tablet Take 500 mg by mouth every 6 (six) hours as needed for pain.      . diphenhydrAMINE (BENADRYL) 25 mg capsule Take 25-50 mg by mouth daily as needed for itching. For allergic reaction      . lidocaine-prilocaine (EMLA) cream Apply 1 application topically as needed. Apply approx 1/2 tsp to skin over port, prior to chemotherapy treatments  1 kit  3  . Multiple Vitamin (MULTIVITAMIN) tablet Take 1 tablet by mouth daily.      . ondansetron (ZOFRAN) 8 MG tablet Take 1 tablet (8 mg total) by mouth every 8 (eight) hours as needed for nausea.  30 tablet  1   No current facility-administered medications for this visit.     Allergies: No Known Allergies  Past Medical History, Surgical history, Social history, and Family History reviewed today and remain unchanged.    Physical Exam:   Blood pressure (!) 134/94, pulse 77, temperature 98 F (36.7 C), temperature source Oral, resp. rate 17, height '5\' 4"'$  (1.626 m), weight 158 lb 1.6 oz (71.7 kg), SpO2 100 %.    ECOG: 0   General appearance: Comfortable appearing without any discomfort Head: Normocephalic without any trauma Oropharynx: Mucous membranes are moist and pink without any thrush or ulcers. Eyes: Pupils are equal  and round reactive to light. Lymph nodes: No cervical, supraclavicular, inguinal or axillary lymphadenopathy.   Heart:regular rate and rhythm.  S1 and S2 without leg edema. Lung: Clear without any rhonchi or wheezes.  No dullness to percussion. Abdomin: Soft, nontender, nondistended with good bowel sounds.  No hepatosplenomegaly. Musculoskeletal: No joint deformity or effusion.  Full range of motion noted. Neurological: No deficits noted on motor, sensory  and deep tendon reflex exam. Skin: No petechial rash or dryness.  Appeared moist.  Psychiatric: Mood and affect appeared appropriate.       CBC    Component Value Date/Time   WBC 4.3 03/30/2018 0749   WBC 4.2 09/08/2017 0834   RBC 4.17 03/30/2018 0749   HGB 13.3 03/30/2018 0749   HGB 12.3 05/26/2017 0809   HCT 39.8 03/30/2018 0749   HCT 37.6 05/26/2017 0809   PLT 220 03/30/2018 0749   PLT 180 05/26/2017 0809   MCV 95.4 03/30/2018 0749   MCV 100.3 05/26/2017 0809   MCH 31.9 03/30/2018 0749   MCHC 33.4 03/30/2018 0749   RDW 14.8 03/30/2018 0749   RDW 17.4 (H) 05/26/2017 0809   LYMPHSABS 1.4 03/30/2018 0749   LYMPHSABS 1.2 05/26/2017 0809   MONOABS 0.4 03/30/2018 0749   MONOABS 0.5 05/26/2017 0809   EOSABS 0.1 03/30/2018 0749   EOSABS 0.1 05/26/2017 0809   BASOSABS 0.0 03/30/2018 0749   BASOSABS 0.0 05/26/2017 0809     Results for Margaret Elliott, Margaret Elliott (MRN [id number]) as of 04/27/2018 09:01  Ref. Range 02/02/2018 09:26 03/02/2018 08:23 03/30/2018 07:49  CEA ([facility]) Latest Ref Range: 0.00 - 5.00 ng/mL 5.72 (H) 6.30 (H) 6.06 (H)     Impression and Plan:  67 year old woman with:   1.  Colon cancer diagnosed in 2014.  She has documented disease to the liver and peritoneum.    She remains on maintenance chemotherapy with 5-FU and a Avastin on a monthly basis with reasonable tolerance.  Risks and benefits of continuing this therapy was agreeable today.  She will have repeat imaging studies in the next week or so.  The plan is to proceed with chemotherapy in December as well potentially and a treatment holiday after that.  Different salvage therapy may be needed if she has progression of disease.  2.  Cather management: Port-A-Cath remains in use without any issues.   3. Neuropathy: Worsening neuropathy noted.  4.  Abdominal discomfort: No issues reported at this time.  Appears to have resolved.  5.  Anxiety: No worsening anxiety at this time.  Her mood remains  stable.  6.  Prognosis: Her disease remains incurable but is treatable at this time.  7. Followup: Will be in 4 weeks for next cycle of chemotherapy.  15 minutes spent today face-to-face with the patient.  More than 50% of today's visit was dedicated to discussing options of therapy, complications related to therapy as well as coordinating plan of care.   Zola Button MD 04/27/18

## 2018-04-28 ENCOUNTER — Telehealth: Payer: Self-pay | Admitting: Oncology

## 2018-04-28 NOTE — Telephone Encounter (Signed)
No los per 11/7 °

## 2018-04-29 ENCOUNTER — Inpatient Hospital Stay: Payer: Medicare Other

## 2018-04-29 VITALS — BP 156/92 | HR 79 | Temp 97.7°F | Resp 16

## 2018-04-29 DIAGNOSIS — G629 Polyneuropathy, unspecified: Secondary | ICD-10-CM | POA: Diagnosis not present

## 2018-04-29 DIAGNOSIS — Z5111 Encounter for antineoplastic chemotherapy: Secondary | ICD-10-CM | POA: Diagnosis not present

## 2018-04-29 DIAGNOSIS — C189 Malignant neoplasm of colon, unspecified: Secondary | ICD-10-CM | POA: Diagnosis not present

## 2018-04-29 DIAGNOSIS — K6389 Other specified diseases of intestine: Secondary | ICD-10-CM

## 2018-04-29 DIAGNOSIS — K769 Liver disease, unspecified: Secondary | ICD-10-CM

## 2018-04-29 DIAGNOSIS — R110 Nausea: Secondary | ICD-10-CM | POA: Diagnosis not present

## 2018-04-29 DIAGNOSIS — C787 Secondary malignant neoplasm of liver and intrahepatic bile duct: Secondary | ICD-10-CM | POA: Diagnosis not present

## 2018-04-29 DIAGNOSIS — C786 Secondary malignant neoplasm of retroperitoneum and peritoneum: Secondary | ICD-10-CM | POA: Diagnosis not present

## 2018-04-29 MED ORDER — HEPARIN SOD (PORK) LOCK FLUSH 100 UNIT/ML IV SOLN
500.0000 [IU] | Freq: Once | INTRAVENOUS | Status: AC | PRN
  Administered 2018-04-29: 500 [IU]
  Filled 2018-04-29: qty 5

## 2018-04-29 MED ORDER — SODIUM CHLORIDE 0.9% FLUSH
10.0000 mL | INTRAVENOUS | Status: DC | PRN
  Administered 2018-04-29: 10 mL
  Filled 2018-04-29: qty 10

## 2018-05-03 ENCOUNTER — Encounter (HOSPITAL_COMMUNITY): Payer: Self-pay

## 2018-05-03 ENCOUNTER — Ambulatory Visit (HOSPITAL_COMMUNITY)
Admission: RE | Admit: 2018-05-03 | Discharge: 2018-05-03 | Disposition: A | Discharge status: Home / Self Care | Payer: Medicare Other | Source: Ambulatory Visit | Attending: Oncology | Admitting: Oncology

## 2018-05-03 DIAGNOSIS — C182 Malignant neoplasm of ascending colon: Secondary | ICD-10-CM | POA: Insufficient documentation

## 2018-05-03 DIAGNOSIS — C189 Malignant neoplasm of colon, unspecified: Secondary | ICD-10-CM | POA: Diagnosis not present

## 2018-05-03 DIAGNOSIS — C787 Secondary malignant neoplasm of liver and intrahepatic bile duct: Secondary | ICD-10-CM | POA: Diagnosis not present

## 2018-05-03 MED ORDER — SODIUM CHLORIDE (PF) 0.9 % IJ SOLN
INTRAMUSCULAR | Status: AC
  Filled 2018-05-03: qty 50

## 2018-05-03 MED ORDER — HEPARIN SOD (PORK) LOCK FLUSH 100 UNIT/ML IV SOLN
500.0000 [IU] | Freq: Once | INTRAVENOUS | Status: AC
  Administered 2018-05-03: 500 [IU] via INTRAVENOUS

## 2018-05-03 MED ORDER — IOHEXOL 300 MG/ML  SOLN
100.0000 mL | Freq: Once | INTRAMUSCULAR | Status: AC | PRN
  Administered 2018-05-03: 100 mL via INTRAVENOUS

## 2018-05-03 MED ORDER — HEPARIN SOD (PORK) LOCK FLUSH 100 UNIT/ML IV SOLN
INTRAVENOUS | Status: AC
  Administered 2018-05-03: 500 [IU] via INTRAVENOUS
  Filled 2018-05-03: qty 5

## 2018-05-25 ENCOUNTER — Inpatient Hospital Stay: Payer: Medicare Other

## 2018-05-25 ENCOUNTER — Inpatient Hospital Stay (HOSPITAL_BASED_OUTPATIENT_CLINIC_OR_DEPARTMENT_OTHER): Payer: Medicare Other | Admitting: Oncology

## 2018-05-25 ENCOUNTER — Telehealth: Payer: Self-pay | Admitting: Oncology

## 2018-05-25 ENCOUNTER — Inpatient Hospital Stay: Payer: Medicare Other | Attending: Oncology

## 2018-05-25 VITALS — BP 153/93 | HR 92 | Temp 97.6°F | Resp 18 | Ht 64.0 in | Wt 158.2 lb

## 2018-05-25 VITALS — BP 148/88

## 2018-05-25 DIAGNOSIS — F419 Anxiety disorder, unspecified: Secondary | ICD-10-CM | POA: Diagnosis not present

## 2018-05-25 DIAGNOSIS — C189 Malignant neoplasm of colon, unspecified: Secondary | ICD-10-CM

## 2018-05-25 DIAGNOSIS — T451X5S Adverse effect of antineoplastic and immunosuppressive drugs, sequela: Secondary | ICD-10-CM | POA: Insufficient documentation

## 2018-05-25 DIAGNOSIS — G620 Drug-induced polyneuropathy: Secondary | ICD-10-CM

## 2018-05-25 DIAGNOSIS — C787 Secondary malignant neoplasm of liver and intrahepatic bile duct: Secondary | ICD-10-CM | POA: Diagnosis not present

## 2018-05-25 DIAGNOSIS — Z79899 Other long term (current) drug therapy: Secondary | ICD-10-CM

## 2018-05-25 DIAGNOSIS — Z5111 Encounter for antineoplastic chemotherapy: Secondary | ICD-10-CM | POA: Diagnosis not present

## 2018-05-25 DIAGNOSIS — C786 Secondary malignant neoplasm of retroperitoneum and peritoneum: Secondary | ICD-10-CM | POA: Insufficient documentation

## 2018-05-25 DIAGNOSIS — K6389 Other specified diseases of intestine: Secondary | ICD-10-CM

## 2018-05-25 DIAGNOSIS — Z95828 Presence of other vascular implants and grafts: Secondary | ICD-10-CM

## 2018-05-25 DIAGNOSIS — K769 Liver disease, unspecified: Secondary | ICD-10-CM

## 2018-05-25 LAB — CMP (CANCER CENTER ONLY)
ALT: 10 U/L (ref 0–44)
AST: 19 U/L (ref 15–41)
Albumin: 3.4 g/dL — ABNORMAL LOW (ref 3.5–5.0)
Alkaline Phosphatase: 88 U/L (ref 38–126)
Anion gap: 8 (ref 5–15)
BILIRUBIN TOTAL: 0.7 mg/dL (ref 0.3–1.2)
BUN: 12 mg/dL (ref 8–23)
CO2: 24 mmol/L (ref 22–32)
Calcium: 9.2 mg/dL (ref 8.9–10.3)
Chloride: 107 mmol/L (ref 98–111)
Creatinine: 1.03 mg/dL — ABNORMAL HIGH (ref 0.44–1.00)
GFR, EST NON AFRICAN AMERICAN: 56 mL/min — AB (ref >60)
GFR, Est AFR Am: >60 mL/min (ref >60)
Glucose, Bld: 108 mg/dL — ABNORMAL HIGH (ref 70–99)
Potassium: 3.7 mmol/L (ref 3.5–5.1)
Sodium: 139 mmol/L (ref 135–145)
TOTAL PROTEIN: 7.3 g/dL (ref 6.5–8.1)

## 2018-05-25 LAB — CBC WITH DIFFERENTIAL (CANCER CENTER ONLY)
Abs Immature Granulocytes: 0.01 10*3/uL (ref 0.00–0.07)
BASOS PCT: 1 %
Basophils Absolute: 0 10*3/uL (ref 0.0–0.1)
EOS PCT: 2 %
Eosinophils Absolute: 0.1 10*3/uL (ref 0.0–0.5)
HCT: 39 % (ref 36.0–46.0)
Hemoglobin: 12.8 g/dL (ref 12.0–15.0)
Immature Granulocytes: 0 %
Lymphocytes Relative: 35 %
Lymphs Abs: 1.7 10*3/uL (ref 0.7–4.0)
MCH: 32 pg (ref 26.0–34.0)
MCHC: 32.8 g/dL (ref 30.0–36.0)
MCV: 97.5 fL (ref 80.0–100.0)
MONO ABS: 0.5 10*3/uL (ref 0.1–1.0)
Monocytes Relative: 11 %
NEUTROS ABS: 2.5 10*3/uL (ref 1.7–7.7)
Neutrophils Relative %: 51 %
Platelet Count: 225 10*3/uL (ref 150–400)
RBC: 4 MIL/uL (ref 3.87–5.11)
RDW: 14 % (ref 11.5–15.5)
WBC: 4.8 10*3/uL (ref 4.0–10.5)
nRBC: 0 % (ref 0.0–0.2)

## 2018-05-25 LAB — TOTAL PROTEIN, URINE DIPSTICK: Protein, ur: NEGATIVE mg/dL

## 2018-05-25 LAB — CEA (IN HOUSE-CHCC): CEA (CHCC-In House): 7.87 ng/mL — ABNORMAL HIGH (ref 0.00–5.00)

## 2018-05-25 MED ORDER — SODIUM CHLORIDE 0.9 % IV SOLN
Freq: Once | INTRAVENOUS | Status: AC
  Administered 2018-05-25: 10:00:00 via INTRAVENOUS
  Filled 2018-05-25: qty 250

## 2018-05-25 MED ORDER — DEXAMETHASONE SODIUM PHOSPHATE 10 MG/ML IJ SOLN
10.0000 mg | Freq: Once | INTRAMUSCULAR | Status: AC
  Administered 2018-05-25: 10 mg via INTRAVENOUS

## 2018-05-25 MED ORDER — PALONOSETRON HCL INJECTION 0.25 MG/5ML
0.2500 mg | Freq: Once | INTRAVENOUS | Status: AC
  Administered 2018-05-25: 0.25 mg via INTRAVENOUS

## 2018-05-25 MED ORDER — PALONOSETRON HCL INJECTION 0.25 MG/5ML
INTRAVENOUS | Status: AC
  Filled 2018-05-25: qty 5

## 2018-05-25 MED ORDER — SODIUM CHLORIDE 0.9 % IV SOLN
2400.0000 mg/m2 | INTRAVENOUS | Status: DC
  Administered 2018-05-25: 4500 mg via INTRAVENOUS
  Filled 2018-05-25: qty 90

## 2018-05-25 MED ORDER — LORAZEPAM 1 MG PO TABS
1.0000 mg | ORAL_TABLET | Freq: Once | ORAL | Status: AC
  Administered 2018-05-25: 1 mg via ORAL

## 2018-05-25 MED ORDER — LORAZEPAM 1 MG PO TABS
ORAL_TABLET | ORAL | Status: AC
  Filled 2018-05-25: qty 1

## 2018-05-25 MED ORDER — DEXAMETHASONE SODIUM PHOSPHATE 10 MG/ML IJ SOLN
INTRAMUSCULAR | Status: AC
  Filled 2018-05-25: qty 1

## 2018-05-25 MED ORDER — LEUCOVORIN CALCIUM INJECTION 350 MG
400.0000 mg/m2 | Freq: Once | INTRAVENOUS | Status: AC
  Administered 2018-05-25: 752 mg via INTRAVENOUS
  Filled 2018-05-25: qty 37.6

## 2018-05-25 MED ORDER — FLUOROURACIL CHEMO INJECTION 2.5 GM/50ML
400.0000 mg/m2 | Freq: Once | INTRAVENOUS | Status: AC
  Administered 2018-05-25: 750 mg via INTRAVENOUS
  Filled 2018-05-25: qty 15

## 2018-05-25 MED ORDER — SODIUM CHLORIDE 0.9 % IV SOLN
5.0000 mg/kg | Freq: Once | INTRAVENOUS | Status: AC
  Administered 2018-05-25: 350 mg via INTRAVENOUS
  Filled 2018-05-25: qty 14

## 2018-05-25 MED ORDER — SODIUM CHLORIDE 0.9% FLUSH
10.0000 mL | Freq: Once | INTRAVENOUS | Status: AC
  Administered 2018-05-25: 10 mL
  Filled 2018-05-25: qty 10

## 2018-05-25 NOTE — Progress Notes (Signed)
Hematology and Oncology Follow Up Visit  Margaret Elliott [id number] [birth year]    05/25/18   Principle Diagnosis: 67 year old woman with colon cancer diagnosed in 2014 where Margaret Elliott found to have hepatic metastasis at the time.  Margaret Elliott subsequently developed peritoneal metastasis that is microsatellite stable with NRAS mutation.   Prior Therapy:  Margaret Elliott is status post laparoscopic laparotomy right hemicolectomy with ileocolonic anastomosis done on December 09, 2012.   FOLFOX and a Avastin chemotherapy started 01/18/2013. Margaret Elliott is S/P 12 cycles completed in 05/2013.   Margaret Elliott is a status post microwave ablation of metastatic lesion in the posterior segment of the right lobe of the liver completed on 09/28/2014 and repeated on 11/30/2014.  Margaret Elliott developed peritoneal recurrence which is biopsy proven to be adenocarcinoma of the colon with NRAS mutation in 2017.  FOLFIRI and a Avastin salvage therapy started on 12/10/2015.  Current therapy:     Maintenance 5-FU, leucovorin and a Avastin every 4 weeks.   Interim History:  Margaret Elliott returns today for a follow-up visit.  Since the last visit, Margaret Elliott reports no major complaints.  Margaret Elliott tolerated the last cycle of maintenance chemotherapy without any new side effects.  Margaret Elliott denies any abdominal distention or worsening pain.  Margaret Elliott denies any nausea, vomiting or early satiety.  Her performance status and quality of life remains excellent.  Margaret Elliott denies any worsening neuropathy.  Margaret Elliott denies any bleeding issues.  Margaret Elliott does report slight irritation in her skin specifically in her palms.  Margaret Elliott continues to use moisturizing cream which helps with these issues.  Margaret Elliott reports no headaches, blurry vision, syncope, syncope or seizures.  Margaret Elliott denies any lethargy or confusion.  Margaret Elliott does not report any fevers, chills or sweats.  Margaret Elliott does not report any chest pain, palpitation or orthopnea.  Denies any leg edema or dyspnea on exertion.  Margaret Elliott does not report any wheezing, hemoptysis.    He denies  any constipation or diarrhea.  Margaret Elliott does not report any frequency urgency or hesitancy. Does not report any skin rashes or lesions.  Margaret Elliott denies any bruising or petechiae.  Margaret Elliott does not report any bone pain or pathological fractures.  Margaret Elliott does not report any anxiety or depression.  Rest of her review of systems is negative.   Medications: I have reviewed the patient's current medications.  Current Outpatient Prescriptions  Medication Sig Dispense Refill  . acetaminophen (TYLENOL) 500 MG tablet Take 500 mg by mouth every 6 (six) hours as needed for pain.      . diphenhydrAMINE (BENADRYL) 25 mg capsule Take 25-50 mg by mouth daily as needed for itching. For allergic reaction      . lidocaine-prilocaine (EMLA) cream Apply 1 application topically as needed. Apply approx 1/2 tsp to skin over port, prior to chemotherapy treatments  1 kit  3  . Multiple Vitamin (MULTIVITAMIN) tablet Take 1 tablet by mouth daily.      . ondansetron (ZOFRAN) 8 MG tablet Take 1 tablet (8 mg total) by mouth every 8 (eight) hours as needed for nausea.  30 tablet  1   No current facility-administered medications for this visit.     Allergies: No Known Allergies  Past Medical History, Surgical history, Social history, and Family History reviewed today and remain unchanged.    Physical Exam:   Blood pressure (!) 153/93, pulse 92, temperature 97.6 F (36.4 C), temperature source Oral, resp. rate 18, height '5\' 4"'$  (1.626 m), weight 158 lb 3.2 oz (71.8 kg), SpO2 100 %.  ECOG: 0   General appearance: Alert, awake without any distress. Head: Atraumatic without abnormalities Oropharynx: Without any thrush or ulcers. Eyes: No scleral icterus. Lymph nodes: No lymphadenopathy noted in the cervical, supraclavicular, or axillary nodes Heart:regular rate and rhythm, without any murmurs or gallops.   Lung: Clear to auscultation without any rhonchi, wheezes or dullness to percussion. Abdomin: Soft, nontender without any  shifting dullness or ascites. Musculoskeletal: No clubbing or cyanosis. Neurological: No motor or sensory deficits. Skin: Mild irritation noted in her palms with slight peeling noted.        CBC    Component Value Date/Time   WBC 5.5 04/27/2018 0833   WBC 4.2 09/08/2017 0834   RBC 3.99 04/27/2018 0833   HGB 12.6 04/27/2018 0833   HGB 12.3 05/26/2017 0809   HCT 39.3 04/27/2018 0833   HCT 37.6 05/26/2017 0809   PLT 264 04/27/2018 0833   PLT 180 05/26/2017 0809   MCV 98.5 04/27/2018 0833   MCV 100.3 05/26/2017 0809   MCH 31.6 04/27/2018 0833   MCHC 32.1 04/27/2018 0833   RDW 14.6 04/27/2018 0833   RDW 17.4 (H) 05/26/2017 0809   LYMPHSABS 1.7 04/27/2018 0833   LYMPHSABS 1.2 05/26/2017 0809   MONOABS 0.5 04/27/2018 0833   MONOABS 0.5 05/26/2017 0809   EOSABS 0.1 04/27/2018 0833   EOSABS 0.1 05/26/2017 0809   BASOSABS 0.0 04/27/2018 0833   BASOSABS 0.0 05/26/2017 0809    Results for Margaret Elliott (MRN [id number]) as of 05/25/2018 08:55  Ref. Range 03/30/2018 07:49 04/27/2018 08:33  [facility] ([facility]) Latest Ref Range: 0.00 - 5.00 ng/mL 6.06 (H) 8.71 (H)    EXAM: CT ABDOMEN AND PELVIS WITH CONTRAST  TECHNIQUE: Multidetector CT imaging of the abdomen and pelvis was performed using the standard protocol following bolus administration of intravenous contrast.  CONTRAST:  128m OMNIPAQUE IOHEXOL 300 MG/ML  SOLN  COMPARISON:  12/01/2017  FINDINGS: Lower chest: Unremarkable.  Hepatobiliary: Posterior right liver lesion is stable at 4.7 x 1.8 cm on axial imaging which compares to 4.8 x 1.8 cm previously. The tiny low-density lesion identified in the central liver is stable. No new liver abnormality. There is no evidence for gallstones, gallbladder wall thickening, or pericholecystic fluid. No intrahepatic or extrahepatic biliary dilation.  Pancreas: No focal mass lesion. No dilatation of the main duct. No intraparenchymal cyst. No peripancreatic  edema.  Spleen: No splenomegaly. No focal mass lesion.  Adrenals/Urinary Tract: No adrenal nodule or mass. Right renal cyst unchanged. Tiny hypoattenuating lesions in the left kidney are too small to characterize and exophytic 9 mm low-density lesion at the lower pole is stable and likely a cyst. No evidence for hydroureter. The urinary bladder appears normal for the degree of distention.  Stomach/Bowel: Stomach is nondistended. No gastric wall thickening. No evidence of outlet obstruction. Duodenum is normally positioned as is the ligament of Treitz. No small bowel wall thickening. No small bowel dilatation. Right hemicolectomy. No gross colonic mass. No colonic wall thickening. Diverticular changes are noted in the left colon without evidence of diverticulitis.  Vascular/Lymphatic: No abdominal aortic aneurysm. No abdominal aortic atherosclerotic calcification. There is no gastrohepatic or hepatoduodenal ligament lymphadenopathy. No intraperitoneal or retroperitoneal lymphadenopathy. No pelvic sidewall lymphadenopathy.  Reproductive: Uterus surgically absent.  There is no adnexal mass.  Other: No intraperitoneal free fluid. 13 mm ill-defined hazy focus of soft tissue attenuation in the left omentum (32/2) is similar to prior. Other scattered omental disease is similar. Bandlike midline omental lesion is 10  mm in thickness today (44/2) stable in the interval. 12 mm right omental nodule (41/2) is stable at 12 mm. There is 1 particular nodule in the left omentum (40/2) that measures slightly larger today at 9 mm compared to 4 mm previously.  Musculoskeletal: No worrisome lytic or sclerotic osseous abnormality.  IMPRESSION: 1. No substantial interval change in exam. Multiple omental lesions are stable in the interval with 1 left-sided omental nodule measuring minimally larger on today's study. 2. Stable appearance of ablation defects posterior right liver. No new liver  lesion on today's exam.    Impression and Plan:  67 year old woman with:   1.  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     Margaret Elliott is currently receiving 5-FU, leucovorin and Avastin for maintenance purposes with disease that is relatively stable.  CT scan obtained on 05/03/2018 was personally reviewed and discussed with the patient which showed stable disease overall.  Risks and benefits of continuing maintenance therapy versus a treatment break was reviewed today.  Alternative therapies were also reviewed which include different salvage therapy including systemic chemotherapy or oral targeted therapy.  After discussion today, Margaret Elliott is agreeable to continue with maintenance chemotherapy to be resumed in January 2020 based on her request.  2.  Port-A-Cath management: Catheter remains in place and will be in use for chemotherapy.   3. Neuropathy: Neuropathy has been stable overall without any deterioration.  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  4.  Abdominal discomfort: Resolved at this time without any recent issues.  5.  Anxiety: Mood remains stable at this time.  Issues with anxiety and depression noted.  6.  Prognosis: Her disease is incurable and any treatment is palliative.  However her performance status is excellent and aggressive measures remains warranted.  7. Followup: Will be in 6 weeks to resume chemotherapy.  25 minutes spent today face-to-face with the patient.  More than 50% of today's visit was dedicated to reviewing the natural course of her disease, reviewing imaging studies and discussing treatment options.   Zola Button MD 05/25/18

## 2018-05-25 NOTE — Patient Instructions (Signed)
[facility] Discharge Instructions for Patients Receiving Chemotherapy  Today you received the following chemotherapy agents: Avastin, Leucovorin, and 5FU  To help prevent nausea and vomiting after your treatment, we encourage you to take your nausea medication as directed   If you develop nausea and vomiting that is not controlled by your nausea medication, call the clinic.   BELOW ARE SYMPTOMS THAT SHOULD BE REPORTED IMMEDIATELY:  *FEVER GREATER THAN 100.5 F  *CHILLS WITH OR WITHOUT FEVER  NAUSEA AND VOMITING THAT IS NOT CONTROLLED WITH YOUR NAUSEA MEDICATION  *UNUSUAL SHORTNESS OF BREATH  *UNUSUAL BRUISING OR BLEEDING  TENDERNESS IN MOUTH AND THROAT WITH OR WITHOUT PRESENCE OF ULCERS  *URINARY PROBLEMS  *BOWEL PROBLEMS  UNUSUAL RASH Items with * indicate a potential emergency and should be followed up as soon as possible.  Feel free to call the clinic should you have any questions or concerns. The clinic phone number is [phone].  Please show the [facility] at check-in to the Emergency Department and triage nurse.

## 2018-05-25 NOTE — Telephone Encounter (Signed)
Printed calendar and avs. °

## 2018-05-27 ENCOUNTER — Inpatient Hospital Stay: Payer: Medicare Other

## 2018-05-27 VITALS — BP 137/91 | HR 73 | Temp 97.8°F | Resp 16

## 2018-05-27 DIAGNOSIS — C189 Malignant neoplasm of colon, unspecified: Secondary | ICD-10-CM

## 2018-05-27 DIAGNOSIS — K6389 Other specified diseases of intestine: Secondary | ICD-10-CM

## 2018-05-27 DIAGNOSIS — C786 Secondary malignant neoplasm of retroperitoneum and peritoneum: Secondary | ICD-10-CM | POA: Diagnosis not present

## 2018-05-27 DIAGNOSIS — C787 Secondary malignant neoplasm of liver and intrahepatic bile duct: Secondary | ICD-10-CM | POA: Diagnosis not present

## 2018-05-27 DIAGNOSIS — K769 Liver disease, unspecified: Secondary | ICD-10-CM

## 2018-05-27 DIAGNOSIS — G620 Drug-induced polyneuropathy: Secondary | ICD-10-CM | POA: Diagnosis not present

## 2018-05-27 DIAGNOSIS — T451X5S Adverse effect of antineoplastic and immunosuppressive drugs, sequela: Secondary | ICD-10-CM | POA: Diagnosis not present

## 2018-05-27 DIAGNOSIS — Z5111 Encounter for antineoplastic chemotherapy: Secondary | ICD-10-CM | POA: Diagnosis not present

## 2018-05-27 MED ORDER — SODIUM CHLORIDE 0.9% FLUSH
10.0000 mL | INTRAVENOUS | Status: DC | PRN
  Administered 2018-05-27: 10 mL
  Filled 2018-05-27: qty 10

## 2018-05-27 MED ORDER — HEPARIN SOD (PORK) LOCK FLUSH 100 UNIT/ML IV SOLN
500.0000 [IU] | Freq: Once | INTRAVENOUS | Status: AC | PRN
  Administered 2018-05-27: 500 [IU]
  Filled 2018-05-27: qty 5

## 2018-05-27 NOTE — Patient Instructions (Signed)
porty Implanted [facility] Guide An implanted port is a type of central line that is placed under the skin. Central lines are used to provide IV access when treatment or nutrition needs to be given through a person's veins. Implanted ports are used for long-term IV access. An implanted port may be placed because:  You need IV medicine that would be irritating to the small veins in your hands or arms.  You need long-term IV medicines, such as antibiotics.  You need IV nutrition for a long period.  You need frequent blood draws for lab tests.  You need dialysis.  Implanted ports are usually placed in the chest area, but they can also be placed in the upper arm, the abdomen, or the leg. An implanted port has two main parts:  Reservoir. The reservoir is round and will appear as a small, raised area under your skin. The reservoir is the part where a needle is inserted to give medicines or draw blood.  Catheter. The catheter is a thin, flexible tube that extends from the reservoir. The catheter is placed into a large vein. Medicine that is inserted into the reservoir goes into the catheter and then into the vein.  How will I care for my incision site? Do not get the incision site wet. Bathe or shower as directed by your health care provider. How is my port accessed? Special steps must be taken to access the port:  Before the port is accessed, a numbing cream can be placed on the skin. This helps numb the skin over the port site.  Your health care provider uses a sterile technique to access the port. ? Your health care provider must put on a mask and sterile gloves. ? The skin over your port is cleaned carefully with an antiseptic and allowed to dry. ? The port is gently pinched between sterile gloves, and a needle is inserted into the port.  Only "non-coring" port needles should be used to access the port. Once the port is accessed, a blood return should be checked. This helps ensure that  the port is in the vein and is not clogged.  If your port needs to remain accessed for a constant infusion, a clear (transparent) bandage will be placed over the needle site. The bandage and needle will need to be changed every week, or as directed by your health care provider.  Keep the bandage covering the needle clean and dry. Do not get it wet. Follow your health care provider's instructions on how to take a shower or bath while the port is accessed.  If your port does not need to stay accessed, no bandage is needed over the port.  What is flushing? Flushing helps keep the port from getting clogged. Follow your health care provider's instructions on how and when to flush the port. Ports are usually flushed with saline solution or a medicine called heparin. The need for flushing will depend on how the port is used.  If the port is used for intermittent medicines or blood draws, the port will need to be flushed: ? After medicines have been given. ? After blood has been drawn. ? As part of routine maintenance.  If a constant infusion is running, the port may not need to be flushed.  How long will my port stay implanted? The port can stay in for as long as your health care provider thinks it is needed. When it is time for the port to come out, surgery will  be done to remove it. The procedure is similar to the one performed when the port was put in. When should I seek immediate medical care? When you have an implanted port, you should seek immediate medical care if:  You notice a bad smell coming from the incision site.  You have swelling, redness, or drainage at the incision site.  You have more swelling or pain at the port site or the surrounding area.  You have a fever that is not controlled with medicine.  This information is not intended to replace advice given to you by your health care provider. Make sure you discuss any questions you have with your health care  provider. Document Released: 06/08/2005 Document Revised: 11/14/2015 Document Reviewed: 02/13/2013 Elsevier Interactive Patient Education  2017 [employer].

## 2018-07-06 ENCOUNTER — Inpatient Hospital Stay: Payer: Medicare Other

## 2018-07-06 ENCOUNTER — Telehealth: Payer: Self-pay | Admitting: Oncology

## 2018-07-06 ENCOUNTER — Inpatient Hospital Stay (HOSPITAL_BASED_OUTPATIENT_CLINIC_OR_DEPARTMENT_OTHER): Payer: Medicare Other | Admitting: Oncology

## 2018-07-06 ENCOUNTER — Inpatient Hospital Stay: Payer: Medicare Other | Attending: Oncology

## 2018-07-06 VITALS — BP 125/92 | HR 92 | Temp 98.1°F | Resp 18 | Ht 64.0 in | Wt 154.0 lb

## 2018-07-06 DIAGNOSIS — Z79899 Other long term (current) drug therapy: Secondary | ICD-10-CM | POA: Diagnosis not present

## 2018-07-06 DIAGNOSIS — C189 Malignant neoplasm of colon, unspecified: Secondary | ICD-10-CM

## 2018-07-06 DIAGNOSIS — C787 Secondary malignant neoplasm of liver and intrahepatic bile duct: Secondary | ICD-10-CM

## 2018-07-06 DIAGNOSIS — Z5111 Encounter for antineoplastic chemotherapy: Secondary | ICD-10-CM | POA: Diagnosis not present

## 2018-07-06 DIAGNOSIS — R109 Unspecified abdominal pain: Secondary | ICD-10-CM

## 2018-07-06 DIAGNOSIS — Z5112 Encounter for antineoplastic immunotherapy: Secondary | ICD-10-CM | POA: Diagnosis not present

## 2018-07-06 DIAGNOSIS — K769 Liver disease, unspecified: Secondary | ICD-10-CM

## 2018-07-06 DIAGNOSIS — F419 Anxiety disorder, unspecified: Secondary | ICD-10-CM | POA: Insufficient documentation

## 2018-07-06 DIAGNOSIS — G620 Drug-induced polyneuropathy: Secondary | ICD-10-CM | POA: Diagnosis not present

## 2018-07-06 DIAGNOSIS — T451X5S Adverse effect of antineoplastic and immunosuppressive drugs, sequela: Secondary | ICD-10-CM | POA: Insufficient documentation

## 2018-07-06 DIAGNOSIS — R5383 Other fatigue: Secondary | ICD-10-CM | POA: Diagnosis not present

## 2018-07-06 DIAGNOSIS — C786 Secondary malignant neoplasm of retroperitoneum and peritoneum: Secondary | ICD-10-CM | POA: Insufficient documentation

## 2018-07-06 DIAGNOSIS — K6389 Other specified diseases of intestine: Secondary | ICD-10-CM

## 2018-07-06 DIAGNOSIS — Z95828 Presence of other vascular implants and grafts: Secondary | ICD-10-CM

## 2018-07-06 LAB — CMP (CANCER CENTER ONLY)
ALBUMIN: 3.6 g/dL (ref 3.5–5.0)
ALT: 13 U/L (ref 0–44)
AST: 18 U/L (ref 15–41)
Alkaline Phosphatase: 80 U/L (ref 38–126)
Anion gap: 8 (ref 5–15)
BILIRUBIN TOTAL: 0.6 mg/dL (ref 0.3–1.2)
BUN: 19 mg/dL (ref 8–23)
CO2: 24 mmol/L (ref 22–32)
Calcium: 9.4 mg/dL (ref 8.9–10.3)
Chloride: 105 mmol/L (ref 98–111)
Creatinine: 0.96 mg/dL (ref 0.44–1.00)
GFR, Est AFR Am: >60 mL/min (ref >60)
GFR, Estimated: >60 mL/min (ref >60)
Glucose, Bld: 97 mg/dL (ref 70–99)
Potassium: 4.1 mmol/L (ref 3.5–5.1)
Sodium: 137 mmol/L (ref 135–145)
Total Protein: 7.8 g/dL (ref 6.5–8.1)

## 2018-07-06 LAB — CBC WITH DIFFERENTIAL (CANCER CENTER ONLY)
ABS IMMATURE GRANULOCYTES: 0.01 10*3/uL (ref 0.00–0.07)
Basophils Absolute: 0 10*3/uL (ref 0.0–0.1)
Basophils Relative: 0 %
Eosinophils Absolute: 0.1 10*3/uL (ref 0.0–0.5)
Eosinophils Relative: 1 %
HCT: 41.2 % (ref 36.0–46.0)
Hemoglobin: 13.4 g/dL (ref 12.0–15.0)
Immature Granulocytes: 0 %
Lymphocytes Relative: 38 %
Lymphs Abs: 2 10*3/uL (ref 0.7–4.0)
MCH: 31.5 pg (ref 26.0–34.0)
MCHC: 32.5 g/dL (ref 30.0–36.0)
MCV: 96.9 fL (ref 80.0–100.0)
Monocytes Absolute: 0.5 10*3/uL (ref 0.1–1.0)
Monocytes Relative: 9 %
NEUTROS ABS: 2.7 10*3/uL (ref 1.7–7.7)
Neutrophils Relative %: 52 %
Platelet Count: 267 10*3/uL (ref 150–400)
RBC: 4.25 MIL/uL (ref 3.87–5.11)
RDW: 13.6 % (ref 11.5–15.5)
WBC Count: 5.4 10*3/uL (ref 4.0–10.5)
nRBC: 0 % (ref 0.0–0.2)

## 2018-07-06 LAB — CEA (IN HOUSE-CHCC): CEA (CHCC-In House): 15.08 ng/mL — ABNORMAL HIGH (ref 0.00–5.00)

## 2018-07-06 LAB — TOTAL PROTEIN, URINE DIPSTICK: Protein, ur: NEGATIVE mg/dL

## 2018-07-06 MED ORDER — DEXAMETHASONE SODIUM PHOSPHATE 10 MG/ML IJ SOLN
INTRAMUSCULAR | Status: AC
  Filled 2018-07-06: qty 1

## 2018-07-06 MED ORDER — FLUOROURACIL CHEMO INJECTION 2.5 GM/50ML
400.0000 mg/m2 | Freq: Once | INTRAVENOUS | Status: AC
  Administered 2018-07-06: 750 mg via INTRAVENOUS
  Filled 2018-07-06: qty 15

## 2018-07-06 MED ORDER — SODIUM CHLORIDE 0.9 % IV SOLN
5.0000 mg/kg | Freq: Once | INTRAVENOUS | Status: AC
  Administered 2018-07-06: 350 mg via INTRAVENOUS
  Filled 2018-07-06: qty 14

## 2018-07-06 MED ORDER — SODIUM CHLORIDE 0.9% FLUSH
10.0000 mL | Freq: Once | INTRAVENOUS | Status: AC
  Administered 2018-07-06: 10 mL
  Filled 2018-07-06: qty 10

## 2018-07-06 MED ORDER — PALONOSETRON HCL INJECTION 0.25 MG/5ML
INTRAVENOUS | Status: AC
  Filled 2018-07-06: qty 5

## 2018-07-06 MED ORDER — PALONOSETRON HCL INJECTION 0.25 MG/5ML
0.2500 mg | Freq: Once | INTRAVENOUS | Status: AC
  Administered 2018-07-06: 0.25 mg via INTRAVENOUS

## 2018-07-06 MED ORDER — LORAZEPAM 1 MG PO TABS
ORAL_TABLET | ORAL | Status: AC
  Filled 2018-07-06: qty 1

## 2018-07-06 MED ORDER — LORAZEPAM 1 MG PO TABS
1.0000 mg | ORAL_TABLET | Freq: Once | ORAL | Status: AC
  Administered 2018-07-06: 1 mg via ORAL

## 2018-07-06 MED ORDER — SODIUM CHLORIDE 0.9 % IV SOLN
2400.0000 mg/m2 | INTRAVENOUS | Status: DC
  Administered 2018-07-06: 4500 mg via INTRAVENOUS
  Filled 2018-07-06: qty 90

## 2018-07-06 MED ORDER — LEUCOVORIN CALCIUM INJECTION 350 MG
400.0000 mg/m2 | Freq: Once | INTRAVENOUS | Status: AC
  Administered 2018-07-06: 752 mg via INTRAVENOUS
  Filled 2018-07-06: qty 37.6

## 2018-07-06 MED ORDER — SODIUM CHLORIDE 0.9 % IV SOLN
Freq: Once | INTRAVENOUS | Status: AC
  Administered 2018-07-06: 10:00:00 via INTRAVENOUS
  Filled 2018-07-06: qty 250

## 2018-07-06 MED ORDER — DEXAMETHASONE SODIUM PHOSPHATE 10 MG/ML IJ SOLN
10.0000 mg | Freq: Once | INTRAMUSCULAR | Status: AC
  Administered 2018-07-06: 10 mg via INTRAVENOUS

## 2018-07-06 NOTE — Telephone Encounter (Signed)
Printed avs. °

## 2018-07-06 NOTE — Progress Notes (Signed)
Hematology and Oncology Follow Up Visit  Margaret Elliott [id number] 1950/09/10    07/06/18   Principle Diagnosis: 68 year old woman with stage IV colon cancer diagnosed in 2014.  She developed peritoneal metastasis that is microsatellite stable with NRAS mutation in 2017.   Prior Therapy:  She is status post laparoscopic laparotomy right hemicolectomy with ileocolonic anastomosis done on December 09, 2012.   FOLFOX and a Avastin chemotherapy started 01/18/2013. She is S/P 12 cycles completed in 05/2013.   She is a status post microwave ablation of metastatic lesion in the posterior segment of the right lobe of the liver completed on 09/28/2014 and repeated on 11/30/2014.  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  FOLFIRI and a Avastin salvage therapy started on 12/10/2015.  Current therapy:     Maintenance 5-FU, leucovorin and a Avastin every 4 weeks.   Interim History:  Margaret Elliott is here for repeat evaluation.  Since the last visit, she reports no major changes in her health.  She continues to feel reasonably well although she does have issues with fatigue at times.  She does report mild abdominal discomfort but no nausea or diarrhea.  She has no worsening neuropathy or changes in her performance status.  Her quality of life remains maintained.  She does work part-time.  She ambulates without any difficulties.  She reports no headaches, blurry vision, syncope, syncope or seizures.  She denies any alteration in mental status or syncope.  She does not report any fevers, chills or sweats.  She does not report any chest pain, palpitation or orthopnea.  Denies any leg edema or palpitation.  She does not report any wheezing, hemoptysis.    He denies any changes in bowel habits.  She does not report any frequency urgency or hesitancy. Does not report any skin rashes or lesions.  She denies any ecchymosis or petechiae.   She does not report any paralysis or myalgias.  She does not report any changes in mood.  Rest of her review of systems is negative.   Medications: I have reviewed the patient's current medications.  Current Outpatient Prescriptions  Medication Sig Dispense Refill  . acetaminophen (TYLENOL) 500 MG tablet Take 500 mg by mouth every 6 (six) hours as needed for pain.      . diphenhydrAMINE (BENADRYL) 25 mg capsule Take 25-50 mg by mouth daily as needed for itching. For allergic reaction      . lidocaine-prilocaine (EMLA) cream Apply 1 application topically as needed. Apply approx 1/2 tsp to skin over port, prior to chemotherapy treatments  1 kit  3  . Multiple Vitamin (MULTIVITAMIN) tablet Take 1 tablet by mouth daily.      . ondansetron (ZOFRAN) 8 MG tablet Take 1 tablet (8 mg total) by mouth every 8 (eight) hours as needed for nausea.  30 tablet  1   No current facility-administered medications for this visit.     Allergies: No Known Allergies  Past Medical History, Surgical history, Social history, and Family History reviewed today and remain unchanged.    Physical Exam:   Blood pressure (!) 125/92, pulse 92, temperature 98.1 F (36.7 C), temperature source Oral, resp. rate 18, height '5\' 4"'$  (1.626 m), weight 154 lb (69.9 kg), SpO2 100 %.     ECOG: 0    General appearance: Comfortable appearing without any discomfort Head: Normocephalic without any trauma Oropharynx: Mucous membranes are moist and pink without any  thrush or ulcers. Eyes: Pupils are equal and round reactive to light. Lymph nodes: No cervical, supraclavicular, inguinal or axillary lymphadenopathy.   Heart:regular rate and rhythm.  S1 and S2 without leg edema. Lung: Clear without any rhonchi or wheezes.  No dullness to percussion. Abdomin: Soft, nontender, nondistended with good bowel sounds.  No hepatosplenomegaly. Musculoskeletal: No joint deformity or effusion.  Full range of motion noted. Neurological: No  deficits noted on motor, sensory and deep tendon reflex exam. Skin: No petechial rash or dryness.  Appeared moist.  Psychiatric: Mood and affect appeared appropriate.         CBC    Component Value Date/Time   WBC 5.4 07/06/2018 0839   WBC 4.2 09/08/2017 0834   RBC 4.25 07/06/2018 0839   HGB 13.4 07/06/2018 0839   HGB 12.3 05/26/2017 0809   HCT 41.2 07/06/2018 0839   HCT 37.6 05/26/2017 0809   PLT 267 07/06/2018 0839   PLT 180 05/26/2017 0809   MCV 96.9 07/06/2018 0839   MCV 100.3 05/26/2017 0809   MCH 31.5 07/06/2018 0839   MCHC 32.5 07/06/2018 0839   RDW 13.6 07/06/2018 0839   RDW 17.4 (H) 05/26/2017 0809   LYMPHSABS 2.0 07/06/2018 0839   LYMPHSABS 1.2 05/26/2017 0809   MONOABS 0.5 07/06/2018 0839   MONOABS 0.5 05/26/2017 0809   EOSABS 0.1 07/06/2018 0839   EOSABS 0.1 05/26/2017 0809   BASOSABS 0.0 07/06/2018 0839   BASOSABS 0.0 05/26/2017 0809   Results for Margaret Elliott (MRN [id number]) as of 07/06/2018 09:07  Ref. Range 04/27/2018 08:33 05/25/2018 08:35  CEA ([facility]) Latest Ref Range: 0.00 - 5.00 ng/mL 8.71 (H) 7.87 (H)    IMPRESSION: 1. No substantial interval change in exam. Multiple omental lesions are stable in the interval with 1 left-sided omental nodule measuring minimally larger on today's study. 2. Stable appearance of ablation defects posterior right liver. No new liver lesion on today's exam.    Impression and Plan:  68 year old woman with:   1.  Colon cancer diagnosed in 2014 subsequently developed peritoneal involvement in 2017.     She has received maintenance chemotherapy utilizing 5-FU, leucovorin and Avastin with excellent response.  Last CT scan obtained in November 2019 was personally reviewed again and images were reviewed with the patient today.  Risks and benefits of continuing maintenance chemotherapy versus different salvage therapy was discussed.  Given the fact that her disease is very minimal at this time I recommended  continue maintenance therapy and she is agreeable.  We will schedule that every 4 to 5 weeks for better tolerance.  2.  Port-A-Cath management: No issues reported and this will be used for chemotherapy periodically.   3. Neuropathy: Related to chemotherapy and remains stable.  4.  Abdominal discomfort: Related to malignancy and her pain has been manageable with very little to no intervention.  5.  Anxiety: Mood remains appropriate at this time.  6.  Prognosis: Therapy is palliative at this time although her performance status is excellent and aggressive therapy is warranted.  7. Followup: Will be in February 2020 for next cycle of therapy.  25 minutes spent today face-to-face with the patient.  More than 50% of today's visit was dedicated to discussing her disease status, reviewing imaging studies and coordinating plan of care.   Zola Button MD 07/06/18

## 2018-07-06 NOTE — Patient Instructions (Signed)
[facility] Discharge Instructions for Patients Receiving Chemotherapy  Today you received the following chemotherapy agents: Avastin, Leucovorin, and 5FU  To help prevent nausea and vomiting after your treatment, we encourage you to take your nausea medication as directed   If you develop nausea and vomiting that is not controlled by your nausea medication, call the clinic.   BELOW ARE SYMPTOMS THAT SHOULD BE REPORTED IMMEDIATELY:  *FEVER GREATER THAN 100.5 F  *CHILLS WITH OR WITHOUT FEVER  NAUSEA AND VOMITING THAT IS NOT CONTROLLED WITH YOUR NAUSEA MEDICATION  *UNUSUAL SHORTNESS OF BREATH  *UNUSUAL BRUISING OR BLEEDING  TENDERNESS IN MOUTH AND THROAT WITH OR WITHOUT PRESENCE OF ULCERS  *URINARY PROBLEMS  *BOWEL PROBLEMS  UNUSUAL RASH Items with * indicate a potential emergency and should be followed up as soon as possible.  Feel free to call the clinic should you have any questions or concerns. The clinic phone number is [phone].  Please show the [facility] at check-in to the Emergency Department and triage nurse.

## 2018-07-08 ENCOUNTER — Inpatient Hospital Stay: Payer: Medicare Other

## 2018-07-08 VITALS — BP 132/88 | HR 89 | Temp 98.2°F | Resp 18

## 2018-07-08 DIAGNOSIS — Z95828 Presence of other vascular implants and grafts: Secondary | ICD-10-CM

## 2018-07-08 DIAGNOSIS — C189 Malignant neoplasm of colon, unspecified: Secondary | ICD-10-CM | POA: Diagnosis not present

## 2018-07-08 DIAGNOSIS — G620 Drug-induced polyneuropathy: Secondary | ICD-10-CM | POA: Diagnosis not present

## 2018-07-08 DIAGNOSIS — Z5112 Encounter for antineoplastic immunotherapy: Secondary | ICD-10-CM | POA: Diagnosis not present

## 2018-07-08 DIAGNOSIS — C787 Secondary malignant neoplasm of liver and intrahepatic bile duct: Secondary | ICD-10-CM

## 2018-07-08 DIAGNOSIS — Z5111 Encounter for antineoplastic chemotherapy: Secondary | ICD-10-CM | POA: Diagnosis not present

## 2018-07-08 DIAGNOSIS — K769 Liver disease, unspecified: Secondary | ICD-10-CM

## 2018-07-08 DIAGNOSIS — K6389 Other specified diseases of intestine: Secondary | ICD-10-CM

## 2018-07-08 DIAGNOSIS — C786 Secondary malignant neoplasm of retroperitoneum and peritoneum: Secondary | ICD-10-CM | POA: Diagnosis not present

## 2018-07-08 MED ORDER — HEPARIN SOD (PORK) LOCK FLUSH 100 UNIT/ML IV SOLN
500.0000 [IU] | Freq: Once | INTRAVENOUS | Status: AC | PRN
  Administered 2018-07-08: 500 [IU]
  Filled 2018-07-08: qty 5

## 2018-07-08 MED ORDER — SODIUM CHLORIDE 0.9% FLUSH
10.0000 mL | INTRAVENOUS | Status: DC | PRN
  Administered 2018-07-08: 10 mL
  Filled 2018-07-08: qty 10

## 2018-07-08 MED ORDER — HEPARIN SOD (PORK) LOCK FLUSH 100 UNIT/ML IV SOLN
500.0000 [IU] | Freq: Once | INTRAVENOUS | Status: DC | PRN
  Filled 2018-07-08: qty 5

## 2018-07-08 MED ORDER — SODIUM CHLORIDE 0.9% FLUSH
10.0000 mL | Freq: Once | INTRAVENOUS | Status: DC
  Filled 2018-07-08: qty 10

## 2018-08-10 ENCOUNTER — Inpatient Hospital Stay: Payer: Medicare Other

## 2018-08-10 ENCOUNTER — Inpatient Hospital Stay (HOSPITAL_BASED_OUTPATIENT_CLINIC_OR_DEPARTMENT_OTHER): Payer: Medicare Other | Admitting: Oncology

## 2018-08-10 ENCOUNTER — Telehealth: Payer: Self-pay | Admitting: Oncology

## 2018-08-10 ENCOUNTER — Inpatient Hospital Stay: Payer: Medicare Other | Attending: Oncology

## 2018-08-10 VITALS — BP 133/85

## 2018-08-10 VITALS — BP 141/98 | HR 82 | Temp 97.8°F | Resp 18 | Ht 64.0 in | Wt 155.9 lb

## 2018-08-10 DIAGNOSIS — K769 Liver disease, unspecified: Secondary | ICD-10-CM

## 2018-08-10 DIAGNOSIS — Z5111 Encounter for antineoplastic chemotherapy: Secondary | ICD-10-CM | POA: Diagnosis not present

## 2018-08-10 DIAGNOSIS — T451X5S Adverse effect of antineoplastic and immunosuppressive drugs, sequela: Secondary | ICD-10-CM

## 2018-08-10 DIAGNOSIS — K6389 Other specified diseases of intestine: Secondary | ICD-10-CM

## 2018-08-10 DIAGNOSIS — C787 Secondary malignant neoplasm of liver and intrahepatic bile duct: Secondary | ICD-10-CM | POA: Insufficient documentation

## 2018-08-10 DIAGNOSIS — C189 Malignant neoplasm of colon, unspecified: Secondary | ICD-10-CM | POA: Diagnosis not present

## 2018-08-10 DIAGNOSIS — C786 Secondary malignant neoplasm of retroperitoneum and peritoneum: Secondary | ICD-10-CM | POA: Insufficient documentation

## 2018-08-10 DIAGNOSIS — F419 Anxiety disorder, unspecified: Secondary | ICD-10-CM | POA: Insufficient documentation

## 2018-08-10 DIAGNOSIS — Z5112 Encounter for antineoplastic immunotherapy: Secondary | ICD-10-CM | POA: Diagnosis not present

## 2018-08-10 DIAGNOSIS — G620 Drug-induced polyneuropathy: Secondary | ICD-10-CM

## 2018-08-10 DIAGNOSIS — Z95828 Presence of other vascular implants and grafts: Secondary | ICD-10-CM

## 2018-08-10 DIAGNOSIS — Z79899 Other long term (current) drug therapy: Secondary | ICD-10-CM | POA: Diagnosis not present

## 2018-08-10 LAB — CBC WITH DIFFERENTIAL (CANCER CENTER ONLY)
ABS IMMATURE GRANULOCYTES: 0.01 10*3/uL (ref 0.00–0.07)
Basophils Absolute: 0 10*3/uL (ref 0.0–0.1)
Basophils Relative: 0 %
Eosinophils Absolute: 0 10*3/uL (ref 0.0–0.5)
Eosinophils Relative: 1 %
HEMATOCRIT: 39.9 % (ref 36.0–46.0)
Hemoglobin: 12.9 g/dL (ref 12.0–15.0)
Immature Granulocytes: 0 %
LYMPHS ABS: 1.7 10*3/uL (ref 0.7–4.0)
Lymphocytes Relative: 42 %
MCH: 31.3 pg (ref 26.0–34.0)
MCHC: 32.3 g/dL (ref 30.0–36.0)
MCV: 96.8 fL (ref 80.0–100.0)
Monocytes Absolute: 0.3 10*3/uL (ref 0.1–1.0)
Monocytes Relative: 8 %
Neutro Abs: 1.9 10*3/uL (ref 1.7–7.7)
Neutrophils Relative %: 49 %
Platelet Count: 213 10*3/uL (ref 150–400)
RBC: 4.12 MIL/uL (ref 3.87–5.11)
RDW: 13.5 % (ref 11.5–15.5)
WBC Count: 4 10*3/uL (ref 4.0–10.5)
nRBC: 0 % (ref 0.0–0.2)

## 2018-08-10 LAB — CMP (CANCER CENTER ONLY)
ALT: 22 U/L (ref 0–44)
AST: 23 U/L (ref 15–41)
Albumin: 3.6 g/dL (ref 3.5–5.0)
Alkaline Phosphatase: 91 U/L (ref 38–126)
Anion gap: 8 (ref 5–15)
BUN: 24 mg/dL — ABNORMAL HIGH (ref 8–23)
CO2: 25 mmol/L (ref 22–32)
Calcium: 8.9 mg/dL (ref 8.9–10.3)
Chloride: 106 mmol/L (ref 98–111)
Creatinine: 0.97 mg/dL (ref 0.44–1.00)
GFR, Est AFR Am: >60 mL/min (ref >60)
GFR, Estimated: >60 mL/min (ref >60)
Glucose, Bld: 120 mg/dL — ABNORMAL HIGH (ref 70–99)
POTASSIUM: 3.7 mmol/L (ref 3.5–5.1)
Sodium: 139 mmol/L (ref 135–145)
Total Bilirubin: 0.5 mg/dL (ref 0.3–1.2)
Total Protein: 7.6 g/dL (ref 6.5–8.1)

## 2018-08-10 LAB — CEA (IN HOUSE-CHCC): CEA (CHCC-In House): 12.93 ng/mL — ABNORMAL HIGH (ref 0.00–5.00)

## 2018-08-10 MED ORDER — SODIUM CHLORIDE 0.9% FLUSH
10.0000 mL | INTRAVENOUS | Status: DC | PRN
  Administered 2018-08-10: 10 mL via INTRAVENOUS
  Filled 2018-08-10: qty 10

## 2018-08-10 MED ORDER — FLUOROURACIL CHEMO INJECTION 2.5 GM/50ML
400.0000 mg/m2 | Freq: Once | INTRAVENOUS | Status: AC
  Administered 2018-08-10: 750 mg via INTRAVENOUS
  Filled 2018-08-10: qty 15

## 2018-08-10 MED ORDER — LORAZEPAM 1 MG PO TABS
ORAL_TABLET | ORAL | Status: AC
  Filled 2018-08-10: qty 1

## 2018-08-10 MED ORDER — PALONOSETRON HCL INJECTION 0.25 MG/5ML
INTRAVENOUS | Status: AC
  Filled 2018-08-10: qty 5

## 2018-08-10 MED ORDER — SODIUM CHLORIDE 0.9% FLUSH
10.0000 mL | INTRAVENOUS | Status: DC | PRN
  Filled 2018-08-10: qty 10

## 2018-08-10 MED ORDER — DEXAMETHASONE SODIUM PHOSPHATE 10 MG/ML IJ SOLN
10.0000 mg | Freq: Once | INTRAMUSCULAR | Status: AC
  Administered 2018-08-10: 10 mg via INTRAVENOUS

## 2018-08-10 MED ORDER — DEXAMETHASONE SODIUM PHOSPHATE 10 MG/ML IJ SOLN
INTRAMUSCULAR | Status: AC
  Filled 2018-08-10: qty 1

## 2018-08-10 MED ORDER — SODIUM CHLORIDE 0.9 % IV SOLN
5.0000 mg/kg | Freq: Once | INTRAVENOUS | Status: AC
  Administered 2018-08-10: 350 mg via INTRAVENOUS
  Filled 2018-08-10: qty 14

## 2018-08-10 MED ORDER — LORAZEPAM 1 MG PO TABS
1.0000 mg | ORAL_TABLET | Freq: Once | ORAL | Status: AC
  Administered 2018-08-10: 1 mg via ORAL

## 2018-08-10 MED ORDER — PROCHLORPERAZINE MALEATE 10 MG PO TABS: 10.0000 mg | ORAL_TABLET | Freq: Four times a day (QID) | ORAL | 2 refills | Status: DC | PRN

## 2018-08-10 MED ORDER — SODIUM CHLORIDE 0.9 % IV SOLN
Freq: Once | INTRAVENOUS | Status: AC
  Administered 2018-08-10: 10:00:00 via INTRAVENOUS
  Filled 2018-08-10: qty 250

## 2018-08-10 MED ORDER — LEUCOVORIN CALCIUM INJECTION 350 MG
399.0000 mg/m2 | Freq: Once | INTRAVENOUS | Status: AC
  Administered 2018-08-10: 750 mg via INTRAVENOUS
  Filled 2018-08-10: qty 37.5

## 2018-08-10 MED ORDER — PALONOSETRON HCL INJECTION 0.25 MG/5ML
0.2500 mg | Freq: Once | INTRAVENOUS | Status: AC
  Administered 2018-08-10: 0.25 mg via INTRAVENOUS

## 2018-08-10 MED ORDER — HEPARIN SOD (PORK) LOCK FLUSH 100 UNIT/ML IV SOLN
500.0000 [IU] | Freq: Once | INTRAVENOUS | Status: DC | PRN
  Filled 2018-08-10: qty 5

## 2018-08-10 MED ORDER — SODIUM CHLORIDE 0.9 % IV SOLN
2400.0000 mg/m2 | INTRAVENOUS | Status: DC
  Administered 2018-08-10: 4500 mg via INTRAVENOUS
  Filled 2018-08-10: qty 90

## 2018-08-10 NOTE — Telephone Encounter (Signed)
Scheduled appt per 2/19 los. ° °Printed calendar and avs. °

## 2018-08-10 NOTE — Progress Notes (Signed)
Hematology and Oncology Follow Up Visit  Margaret Elliott [id number] 1950-07-31    08/10/18   Principle Diagnosis: 68 year old woman with colon cancer diagnosed in 2014.  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  Her tumor is microsatellite stable with NRAS mutation in 2017.   Prior Therapy:  She is status post laparoscopic laparotomy right hemicolectomy with ileocolonic anastomosis done on December 09, 2012.   FOLFOX and a Avastin chemotherapy started 01/18/2013. She is S/P 12 cycles completed in 05/2013.   She is a status post microwave ablation of metastatic lesion in the posterior segment of the right lobe of the liver completed on 09/28/2014 and repeated on 11/30/2014.  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  FOLFIRI and a Avastin salvage therapy started on 12/10/2015.  Current therapy:     Maintenance 5-FU, leucovorin and a Avastin every 4 weeks.   Interim History:  [name] returns today for repeat evaluation.  Since the last visit, he reports no major changes in her health.  She does report intermittent abdominal soreness in the left lower quadrant but mostly not persistent.  It is not associated with any vomiting but does report some mild nausea.  She denies any diarrhea or changes in her performance status.  Appetite is remained reasonable and weight is unchanged.  She had no issues with the last cycle of chemotherapy at this time.  She denies any infusion related issues or rash.  Her quality of life is unchanged.  Patient denied any alteration mental status, neuropathy, confusion or dizziness.  Denies any headaches or lethargy.  Denies any night sweats, weight loss or changes in appetite.  Denied orthopnea, dyspnea on exertion or chest discomfort.  Denies shortness of breath, difficulty breathing hemoptysis or cough.  Denies any abdominal distention, nausea, early satiety or  dyspepsia.  Denies any hematuria, frequency, dysuria or nocturia.  Denies any skin irritation, dryness or rash.  Denies any ecchymosis or petechiae.  Denies any lymphadenopathy or clotting.  Denies any heat or cold intolerance.  Denies any anxiety or depression.  Remaining review of system is negative.     Medications: I have reviewed the patient's current medications.  Current Outpatient Prescriptions  Medication Sig Dispense Refill  . acetaminophen (TYLENOL) 500 MG tablet Take 500 mg by mouth every 6 (six) hours as needed for pain.      . diphenhydrAMINE (BENADRYL) 25 mg capsule Take 25-50 mg by mouth daily as needed for itching. For allergic reaction      . lidocaine-prilocaine (EMLA) cream Apply 1 application topically as needed. Apply approx 1/2 tsp to skin over port, prior to chemotherapy treatments  1 kit  3  . Multiple Vitamin (MULTIVITAMIN) tablet Take 1 tablet by mouth daily.      . ondansetron (ZOFRAN) 8 MG tablet Take 1 tablet (8 mg total) by mouth every 8 (eight) hours as needed for nausea.  30 tablet  1   No current facility-administered medications for this visit.     Allergies: No Known Allergies  Past Medical History, Surgical history, Social history, and Family History reviewed today and remain unchanged.    Physical Exam:   Blood pressure (!) 141/98, pulse 82, temperature 97.8 F (36.6 C), temperature source Oral, resp. rate 18, height '5\' 4"'$  (1.626 m), weight 155 lb 14.4 oz (70.7 kg), SpO2 100 %.     ECOG: 0    General appearance: Alert, awake  without any distress. Head: Atraumatic without abnormalities Oropharynx: Without any thrush or ulcers. Eyes: No scleral icterus. Lymph nodes: No lymphadenopathy noted in the cervical, supraclavicular, or axillary nodes Heart:regular rate and rhythm, without any murmurs or gallops.   Lung: Clear to auscultation without any rhonchi, wheezes or dullness to percussion. Abdomin: Soft, nontender without any shifting  dullness or ascites. Musculoskeletal: No clubbing or cyanosis. Neurological: No motor or sensory deficits. Skin: No rashes or lesions.         CBC    Component Value Date/Time   WBC 4.0 08/10/2018 0835   WBC 4.2 09/08/2017 0834   RBC 4.12 08/10/2018 0835   HGB 12.9 08/10/2018 0835   HGB 12.3 05/26/2017 0809   HCT 39.9 08/10/2018 0835   HCT 37.6 05/26/2017 0809   PLT 213 08/10/2018 0835   PLT 180 05/26/2017 0809   MCV 96.8 08/10/2018 0835   MCV 100.3 05/26/2017 0809   MCH 31.3 08/10/2018 0835   MCHC 32.3 08/10/2018 0835   RDW 13.5 08/10/2018 0835   RDW 17.4 (H) 05/26/2017 0809   LYMPHSABS 1.7 08/10/2018 0835   LYMPHSABS 1.2 05/26/2017 0809   MONOABS 0.3 08/10/2018 0835   MONOABS 0.5 05/26/2017 0809   EOSABS 0.0 08/10/2018 0835   EOSABS 0.1 05/26/2017 0809   BASOSABS 0.0 08/10/2018 0835   BASOSABS 0.0 05/26/2017 0809    Results for MARNAE, MADANI (MRN [id number]) as of 08/10/2018 09:01  Ref. Range 05/25/2018 08:35 07/06/2018 08:39  CEA ([facility]) Latest Ref Range: 0.00 - 5.00 ng/mL 7.87 (H) 15.08 (H)     Impression and Plan:  68 year old woman with:   1.  Stage IV colon cancer with hepatic and peritoneal involvement that was documented in 2017 after initial presentation in 2014.   She is currently receiving maintenance chemotherapy utilizing 5-FU, leucovorin and Avastin without any clinical signs of progression.  Her CEA has been rising although as of late.  The risks and benefits of continuing maintenance chemotherapy was reviewed today she is agreeable to continue on the plan is to repeat imaging studies in March 2020.  Different salvage therapy may be needed if she has progression of disease.  2.  IV access: Port-A-Cath remains in place without any issues.   3. Neuropathy: Related to excel the plan and remains stable at this time.  4.  Abdominal discomfort: Very mild at this time.  We will repeat imaging studies to ensure disease stability.  5.   Anxiety: No worsening mood issues reported at this time.  6.  Prognosis: Therapy remains palliative although her performance status is excellent and aggressive therapy is warranted.  7.  Antiemetics: Prescription for Compazine will be made available to her.  8. Followup: Will be in 4 weeks for repeat chemotherapy after CT scan.  25 minutes spent today face-to-face with the patient.  More than 50% of today's visit was spent on reviewing the natural course of her disease, treatment options and managing complications related to therapy.   Zola Button MD 08/10/18

## 2018-08-10 NOTE — Patient Instructions (Signed)
[place] Discharge Instructions for Patients Receiving Chemotherapy  Today you received the following chemotherapy agents Avastin, Leucovorin and 5FU  To help prevent nausea and vomiting after your treatment, we encourage you to take your nausea medication as directed.    If you develop nausea and vomiting that is not controlled by your nausea medication, call the clinic.   BELOW ARE SYMPTOMS THAT SHOULD BE REPORTED IMMEDIATELY:  *FEVER GREATER THAN 100.5 F  *CHILLS WITH OR WITHOUT FEVER  NAUSEA AND VOMITING THAT IS NOT CONTROLLED WITH YOUR NAUSEA MEDICATION  *UNUSUAL SHORTNESS OF BREATH  *UNUSUAL BRUISING OR BLEEDING  TENDERNESS IN MOUTH AND THROAT WITH OR WITHOUT PRESENCE OF ULCERS  *URINARY PROBLEMS  *BOWEL PROBLEMS  UNUSUAL RASH Items with * indicate a potential emergency and should be followed up as soon as possible.  Feel free to call the clinic should you have any questions or concerns. The clinic phone number is [phone].  Please show the [facility] at check-in to the Emergency Department and triage nurse.

## 2018-08-12 ENCOUNTER — Inpatient Hospital Stay: Payer: Medicare Other

## 2018-08-12 VITALS — BP 145/85 | HR 69 | Temp 97.9°F | Resp 18

## 2018-08-12 DIAGNOSIS — K6389 Other specified diseases of intestine: Secondary | ICD-10-CM

## 2018-08-12 DIAGNOSIS — Z5111 Encounter for antineoplastic chemotherapy: Secondary | ICD-10-CM | POA: Diagnosis not present

## 2018-08-12 DIAGNOSIS — C189 Malignant neoplasm of colon, unspecified: Secondary | ICD-10-CM | POA: Diagnosis not present

## 2018-08-12 DIAGNOSIS — G620 Drug-induced polyneuropathy: Secondary | ICD-10-CM | POA: Diagnosis not present

## 2018-08-12 DIAGNOSIS — K769 Liver disease, unspecified: Secondary | ICD-10-CM

## 2018-08-12 DIAGNOSIS — C786 Secondary malignant neoplasm of retroperitoneum and peritoneum: Secondary | ICD-10-CM | POA: Diagnosis not present

## 2018-08-12 DIAGNOSIS — Z5112 Encounter for antineoplastic immunotherapy: Secondary | ICD-10-CM | POA: Diagnosis not present

## 2018-08-12 DIAGNOSIS — C787 Secondary malignant neoplasm of liver and intrahepatic bile duct: Secondary | ICD-10-CM | POA: Diagnosis not present

## 2018-08-12 MED ORDER — HEPARIN SOD (PORK) LOCK FLUSH 100 UNIT/ML IV SOLN
500.0000 [IU] | Freq: Once | INTRAVENOUS | Status: AC | PRN
  Administered 2018-08-12: 500 [IU]
  Filled 2018-08-12: qty 5

## 2018-08-12 MED ORDER — SODIUM CHLORIDE 0.9% FLUSH
10.0000 mL | INTRAVENOUS | Status: DC | PRN
  Administered 2018-08-12: 10 mL
  Filled 2018-08-12: qty 10

## 2018-08-26 ENCOUNTER — Other Ambulatory Visit: Payer: Self-pay | Admitting: Oncology

## 2018-09-05 ENCOUNTER — Other Ambulatory Visit: Payer: Self-pay

## 2018-09-05 ENCOUNTER — Ambulatory Visit (HOSPITAL_COMMUNITY)
Admission: RE | Admit: 2018-09-05 | Discharge: 2018-09-05 | Disposition: A | Discharge status: Home / Self Care | Payer: Medicare Other | Source: Ambulatory Visit | Attending: Oncology | Admitting: Oncology

## 2018-09-05 DIAGNOSIS — C787 Secondary malignant neoplasm of liver and intrahepatic bile duct: Secondary | ICD-10-CM | POA: Diagnosis not present

## 2018-09-05 DIAGNOSIS — C189 Malignant neoplasm of colon, unspecified: Secondary | ICD-10-CM | POA: Insufficient documentation

## 2018-09-05 MED ORDER — SODIUM CHLORIDE (PF) 0.9 % IJ SOLN
INTRAMUSCULAR | Status: AC
  Filled 2018-09-05: qty 50

## 2018-09-05 MED ORDER — HEPARIN SOD (PORK) LOCK FLUSH 100 UNIT/ML IV SOLN
INTRAVENOUS | Status: AC
  Filled 2018-09-05: qty 5

## 2018-09-05 MED ORDER — IOHEXOL 300 MG/ML  SOLN
100.0000 mL | Freq: Once | INTRAMUSCULAR | Status: AC | PRN
  Administered 2018-09-05: 100 mL via INTRAVENOUS

## 2018-09-05 MED ORDER — HEPARIN SOD (PORK) LOCK FLUSH 100 UNIT/ML IV SOLN
500.0000 [IU] | Freq: Once | INTRAVENOUS | Status: AC
  Administered 2018-09-05: 500 [IU] via INTRAVENOUS

## 2018-09-06 ENCOUNTER — Telehealth: Payer: Self-pay

## 2018-09-06 NOTE — Telephone Encounter (Signed)
Contacted patient and screened for COVID-19. Patient has not traveled recently, is asymptomatic, and has not been in contact with anyone that has traveled or has any symptoms. Patient aware that she will be screened again tomorrow at the front desk and was appreciative of the call.

## 2018-09-07 ENCOUNTER — Inpatient Hospital Stay: Payer: Medicare Other | Attending: Oncology

## 2018-09-07 ENCOUNTER — Other Ambulatory Visit: Payer: Self-pay

## 2018-09-07 ENCOUNTER — Ambulatory Visit: Payer: Medicare Other | Admitting: Oncology

## 2018-09-07 ENCOUNTER — Ambulatory Visit: Payer: Medicare Other

## 2018-09-07 ENCOUNTER — Telehealth: Payer: Self-pay | Admitting: Oncology

## 2018-09-07 ENCOUNTER — Inpatient Hospital Stay (HOSPITAL_BASED_OUTPATIENT_CLINIC_OR_DEPARTMENT_OTHER): Payer: Medicare Other | Admitting: Oncology

## 2018-09-07 ENCOUNTER — Inpatient Hospital Stay: Payer: Medicare Other

## 2018-09-07 ENCOUNTER — Other Ambulatory Visit: Payer: Self-pay | Admitting: Oncology

## 2018-09-07 VITALS — BP 141/90 | HR 94 | Temp 97.9°F | Resp 18 | Ht 64.0 in | Wt 155.6 lb

## 2018-09-07 VITALS — BP 139/99 | HR 89

## 2018-09-07 DIAGNOSIS — Z5111 Encounter for antineoplastic chemotherapy: Secondary | ICD-10-CM | POA: Insufficient documentation

## 2018-09-07 DIAGNOSIS — C189 Malignant neoplasm of colon, unspecified: Secondary | ICD-10-CM

## 2018-09-07 DIAGNOSIS — C786 Secondary malignant neoplasm of retroperitoneum and peritoneum: Secondary | ICD-10-CM

## 2018-09-07 DIAGNOSIS — F419 Anxiety disorder, unspecified: Secondary | ICD-10-CM | POA: Diagnosis not present

## 2018-09-07 DIAGNOSIS — G893 Neoplasm related pain (acute) (chronic): Secondary | ICD-10-CM | POA: Diagnosis not present

## 2018-09-07 DIAGNOSIS — C787 Secondary malignant neoplasm of liver and intrahepatic bile duct: Secondary | ICD-10-CM

## 2018-09-07 DIAGNOSIS — Z452 Encounter for adjustment and management of vascular access device: Secondary | ICD-10-CM | POA: Insufficient documentation

## 2018-09-07 DIAGNOSIS — K769 Liver disease, unspecified: Secondary | ICD-10-CM

## 2018-09-07 DIAGNOSIS — K6389 Other specified diseases of intestine: Secondary | ICD-10-CM

## 2018-09-07 DIAGNOSIS — Z95828 Presence of other vascular implants and grafts: Secondary | ICD-10-CM

## 2018-09-07 LAB — CBC WITH DIFFERENTIAL (CANCER CENTER ONLY)
Abs Immature Granulocytes: 0.04 10*3/uL (ref 0.00–0.07)
Basophils Absolute: 0 10*3/uL (ref 0.0–0.1)
Basophils Relative: 1 %
EOS ABS: 0.1 10*3/uL (ref 0.0–0.5)
Eosinophils Relative: 2 %
HCT: 41 % (ref 36.0–46.0)
Hemoglobin: 13.3 g/dL (ref 12.0–15.0)
Immature Granulocytes: 1 %
Lymphocytes Relative: 33 %
Lymphs Abs: 1.6 10*3/uL (ref 0.7–4.0)
MCH: 32 pg (ref 26.0–34.0)
MCHC: 32.4 g/dL (ref 30.0–36.0)
MCV: 98.8 fL (ref 80.0–100.0)
Monocytes Absolute: 0.5 10*3/uL (ref 0.1–1.0)
Monocytes Relative: 11 %
NRBC: 0 % (ref 0.0–0.2)
Neutro Abs: 2.5 10*3/uL (ref 1.7–7.7)
Neutrophils Relative %: 52 %
Platelet Count: 256 10*3/uL (ref 150–400)
RBC: 4.15 MIL/uL (ref 3.87–5.11)
RDW: 14.2 % (ref 11.5–15.5)
WBC Count: 4.8 10*3/uL (ref 4.0–10.5)

## 2018-09-07 LAB — CMP (CANCER CENTER ONLY)
ALT: 12 U/L (ref 0–44)
AST: 18 U/L (ref 15–41)
Albumin: 3.5 g/dL (ref 3.5–5.0)
Alkaline Phosphatase: 93 U/L (ref 38–126)
Anion gap: 9 (ref 5–15)
BUN: 14 mg/dL (ref 8–23)
CALCIUM: 9.2 mg/dL (ref 8.9–10.3)
CO2: 24 mmol/L (ref 22–32)
Chloride: 107 mmol/L (ref 98–111)
Creatinine: 0.97 mg/dL (ref 0.44–1.00)
GFR, Estimated: >60 mL/min (ref >60)
Glucose, Bld: 87 mg/dL (ref 70–99)
Potassium: 4.1 mmol/L (ref 3.5–5.1)
Sodium: 140 mmol/L (ref 135–145)
Total Bilirubin: 0.6 mg/dL (ref 0.3–1.2)
Total Protein: 7.6 g/dL (ref 6.5–8.1)

## 2018-09-07 LAB — TOTAL PROTEIN, URINE DIPSTICK: Protein, ur: NEGATIVE mg/dL

## 2018-09-07 MED ORDER — PALONOSETRON HCL INJECTION 0.25 MG/5ML
INTRAVENOUS | Status: AC
  Filled 2018-09-07: qty 5

## 2018-09-07 MED ORDER — SODIUM CHLORIDE 0.9 % IV SOLN
4500.0000 mg | INTRAVENOUS | Status: DC
  Administered 2018-09-07: 4500 mg via INTRAVENOUS
  Filled 2018-09-07: qty 90

## 2018-09-07 MED ORDER — LORAZEPAM 1 MG PO TABS: 1.0000 mg | ORAL_TABLET | Freq: Three times a day (TID) | ORAL | 0 refills | Status: DC | PRN

## 2018-09-07 MED ORDER — SODIUM CHLORIDE 0.9 % IV SOLN
5.0000 mg/kg | Freq: Once | INTRAVENOUS | Status: AC
  Administered 2018-09-07: 350 mg via INTRAVENOUS
  Filled 2018-09-07: qty 14

## 2018-09-07 MED ORDER — FLUOROURACIL CHEMO INJECTION 2.5 GM/50ML
750.0000 mg | Freq: Once | INTRAVENOUS | Status: AC
  Administered 2018-09-07: 750 mg via INTRAVENOUS
  Filled 2018-09-07: qty 15

## 2018-09-07 MED ORDER — SODIUM CHLORIDE 0.9 % IV SOLN: 10.0000 mg | Freq: Once | INTRAVENOUS | Status: DC

## 2018-09-07 MED ORDER — SODIUM CHLORIDE 0.9 % IV SOLN
Freq: Once | INTRAVENOUS | Status: DC
  Filled 2018-09-07: qty 250

## 2018-09-07 MED ORDER — LEUCOVORIN CALCIUM INJECTION 350 MG
750.0000 mg | Freq: Once | INTRAVENOUS | Status: AC
  Administered 2018-09-07: 750 mg via INTRAVENOUS
  Filled 2018-09-07: qty 37.5

## 2018-09-07 MED ORDER — SODIUM CHLORIDE 0.9% FLUSH
10.0000 mL | Freq: Once | INTRAVENOUS | Status: AC
  Administered 2018-09-07: 10 mL
  Filled 2018-09-07: qty 10

## 2018-09-07 MED ORDER — HYDROCODONE-ACETAMINOPHEN 5-325 MG PO TABS: 1.0000 | ORAL_TABLET | ORAL | 0 refills | Status: DC | PRN

## 2018-09-07 MED ORDER — DEXAMETHASONE SODIUM PHOSPHATE 10 MG/ML IJ SOLN
INTRAMUSCULAR | Status: AC
  Filled 2018-09-07: qty 1

## 2018-09-07 MED ORDER — SODIUM CHLORIDE 0.9 % IV SOLN
Freq: Once | INTRAVENOUS | Status: AC
  Administered 2018-09-07: 11:00:00 via INTRAVENOUS
  Filled 2018-09-07: qty 250

## 2018-09-07 MED ORDER — DEXAMETHASONE SODIUM PHOSPHATE 10 MG/ML IJ SOLN
10.0000 mg | Freq: Once | INTRAMUSCULAR | Status: AC
  Administered 2018-09-07: 10 mg via INTRAVENOUS

## 2018-09-07 MED ORDER — PALONOSETRON HCL INJECTION 0.25 MG/5ML
0.2500 mg | Freq: Once | INTRAVENOUS | Status: AC
  Administered 2018-09-07: 0.25 mg via INTRAVENOUS

## 2018-09-07 NOTE — Patient Instructions (Signed)
[facility] Discharge Instructions for Patients Receiving Chemotherapy  Today you received the following chemotherapy agents :  Avastin, Leucovorin, Fluorouracil.  To help prevent nausea and vomiting after your treatment, we encourage you to take your nausea medication as prescribed.   If you develop nausea and vomiting that is not controlled by your nausea medication, call the clinic.   BELOW ARE SYMPTOMS THAT SHOULD BE REPORTED IMMEDIATELY:  *FEVER GREATER THAN 100.5 F  *CHILLS WITH OR WITHOUT FEVER  NAUSEA AND VOMITING THAT IS NOT CONTROLLED WITH YOUR NAUSEA MEDICATION  *UNUSUAL SHORTNESS OF BREATH  *UNUSUAL BRUISING OR BLEEDING  TENDERNESS IN MOUTH AND THROAT WITH OR WITHOUT PRESENCE OF ULCERS  *URINARY PROBLEMS  *BOWEL PROBLEMS  UNUSUAL RASH Items with * indicate a potential emergency and should be followed up as soon as possible.  Feel free to call the clinic should you have any questions or concerns. The clinic phone number is [phone].  Please show the CHEMO ALERT CARD at check-in to the Emergency Department and triage nurse.   

## 2018-09-07 NOTE — Progress Notes (Signed)
Dr. Alen Blew notified of BP readings post Avastin.  No new order from MD.

## 2018-09-07 NOTE — Progress Notes (Signed)
START ON PATHWAY REGIMEN - Colorectal     A cycle is every 14 days:     Irinotecan      Leucovorin      5-Fluorouracil      5-Fluorouracil      Bevacizumab-xxxx   **Always confirm dose/schedule in your pharmacy ordering system**  Patient Characteristics: Distant Metastases, Second Line, KRAS Mutation Positive/Unknown, BRAF Wild-Type/Unknown, Bevacizumab Eligible Therapeutic Status: Distant Metastases BRAF Mutation Status: Wild-Type (no mutation) KRAS/NRAS Mutation Status: Mutation Positive Line of Therapy: Second Line  Intent of Therapy: Non-Curative / Palliative Intent, Discussed with Patient 

## 2018-09-07 NOTE — Progress Notes (Signed)
Hematology and Oncology Follow Up Visit  Margaret Elliott [id number] September 01, 1950    09/07/18   Principle Diagnosis: 68 year old woman with stage IV colon cancer diagnosed in 2014.  She presented with hepatic metastasis and subsequently developed microsatellite stable,  NRAS mutated peritoneal metastasis and 2017.   Prior Therapy:  She is status post laparoscopic laparotomy right hemicolectomy with ileocolonic anastomosis done on December 09, 2012.   FOLFOX and a Avastin chemotherapy started 01/18/2013. She is S/P 12 cycles completed in 05/2013.   She is a status post microwave ablation of metastatic lesion in the posterior segment of the right lobe of the liver completed on 09/28/2014 and repeated on 11/30/2014.  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  FOLFIRI and a Avastin salvage therapy started on 12/10/2015.  Current therapy:     Maintenance 5-FU, leucovorin and a Avastin every 4 weeks.   Interim History:  Margaret Elliott is here for a follow-up.  Since her last visit, she reports no major changes in her health.  She still reports intermittent epigastric abdominal discomfort associated with mobility.  She denies any nausea or diarrhea.  She denies any changes in her bowel habits.  Her appetite remained reasonable and has not had any weight loss.  Her pain is manageable with current pain medication and at times she uses hydrocodone.  Her performance status and quality of life remains unchanged.   Patient denied headaches, blurry vision, syncope or seizures.  Denies any fevers, chills or sweats.  Denied chest pain, palpitation, orthopnea or leg edema.  Denied cough, wheezing or hemoptysis.  Denied nausea, vomiting or abdominal pain.  Denies any constipation or diarrhea.  Denies any frequency urgency or hesitancy.  Denies any arthralgias or myalgias.  Denies any skin rashes or lesions.  Denies any bleeding or  clotting tendency.  Denies any easy bruising.  Denies any hair or nail changes.  Denies any anxiety or depression.  Remaining review of system is negative.       Medications: I have reviewed the patient's current medications.  Current Outpatient Prescriptions  Medication Sig Dispense Refill  . acetaminophen (TYLENOL) 500 MG tablet Take 500 mg by mouth every 6 (six) hours as needed for pain.      . diphenhydrAMINE (BENADRYL) 25 mg capsule Take 25-50 mg by mouth daily as needed for itching. For allergic reaction      . lidocaine-prilocaine (EMLA) cream Apply 1 application topically as needed. Apply approx 1/2 tsp to skin over port, prior to chemotherapy treatments  1 kit  3  . Multiple Vitamin (MULTIVITAMIN) tablet Take 1 tablet by mouth daily.      . ondansetron (ZOFRAN) 8 MG tablet Take 1 tablet (8 mg total) by mouth every 8 (eight) hours as needed for nausea.  30 tablet  1   No current facility-administered medications for this visit.     Allergies: No Known Allergies  Past Medical History, Surgical history, Social history, and Family History reviewed today and remain unchanged.    Physical Exam:   Blood pressure (!) 141/90, pulse 94, temperature 97.9 F (36.6 C), temperature source Oral, resp. rate 18, height '5\' 4"'$  (1.626 m), weight 155 lb 9.6 oz (70.6 kg), SpO2 99 %.      ECOG: 0    General appearance: Comfortable appearing without any discomfort Head: Normocephalic without any trauma Oropharynx: Mucous membranes are moist and pink without any thrush or ulcers. Eyes: Pupils  are equal and round reactive to light. Lymph nodes: No cervical, supraclavicular, inguinal or axillary lymphadenopathy.   Heart:regular rate and rhythm.  S1 and S2 without leg edema. Lung: Clear without any rhonchi or wheezes.  No dullness to percussion. Abdomin: Soft, nontender, nondistended with good bowel sounds.  No hepatosplenomegaly. Musculoskeletal: No joint deformity or effusion.  Full  range of motion noted. Neurological: No deficits noted on motor, sensory and deep tendon reflex exam. Skin: No petechial rash or dryness.  Appeared moist.          CBC    Component Value Date/Time   WBC 4.0 08/10/2018 0835   WBC 4.2 09/08/2017 0834   RBC 4.12 08/10/2018 0835   HGB 12.9 08/10/2018 0835   HGB 12.3 05/26/2017 0809   HCT 39.9 08/10/2018 0835   HCT 37.6 05/26/2017 0809   PLT 213 08/10/2018 0835   PLT 180 05/26/2017 0809   MCV 96.8 08/10/2018 0835   MCV 100.3 05/26/2017 0809   MCH 31.3 08/10/2018 0835   MCHC 32.3 08/10/2018 0835   RDW 13.5 08/10/2018 0835   RDW 17.4 (H) 05/26/2017 0809   LYMPHSABS 1.7 08/10/2018 0835   LYMPHSABS 1.2 05/26/2017 0809   MONOABS 0.3 08/10/2018 0835   MONOABS 0.5 05/26/2017 0809   EOSABS 0.0 08/10/2018 0835   EOSABS 0.1 05/26/2017 0809   BASOSABS 0.0 08/10/2018 0835   BASOSABS 0.0 05/26/2017 0809   Results for Margaret Elliott, Margaret Elliott (MRN [id number]) as of 09/07/2018 09:17  Ref. Range 07/06/2018 08:39 08/10/2018 08:35  CEA ([facility]) Latest Ref Range: 0.00 - 5.00 ng/mL 15.08 (H) 12.93 (H)   EXAM: CT CHEST, ABDOMEN, AND PELVIS WITH CONTRAST  TECHNIQUE: Multidetector CT imaging of the chest, abdomen and pelvis was performed following the standard protocol during bolus administration of intravenous contrast.  CONTRAST:  157m OMNIPAQUE IOHEXOL 300 MG/ML  SOLN  COMPARISON:  CT abdomen pelvis, 05/03/2018, CT chest abdomen pelvis, 12/01/2017  FINDINGS: CT CHEST FINDINGS  Cardiovascular: Left chest port catheter. Scattered coronary artery calcifications. Normal heart size. No pericardial effusion.  Mediastinum/Nodes: No enlarged mediastinal, hilar, or axillary lymph nodes. Thyroid gland, trachea, and esophagus demonstrate no significant findings.  Lungs/Pleura: Lungs are clear. No pleural effusion or pneumothorax.  Musculoskeletal: No chest wall mass or suspicious bone lesions identified.  CT ABDOMEN PELVIS  FINDINGS  Hepatobiliary: No significant change in a subcapsular hypodense postablation lesion of the posterior right lobe of liver measuring 4.8 by 2.3 cm (series 2, image 47). No gallstones, gallbladder wall thickening, or biliary dilatation.  Pancreas: Unremarkable. No pancreatic ductal dilatation or surrounding inflammatory changes.  Spleen: Normal in size without focal abnormality.  Adrenals/Urinary Tract: Adrenal glands are unremarkable. Kidneys are normal, without renal calculi, focal lesion, or hydronephrosis. Bladder is unremarkable.  Stomach/Bowel: Stomach is within normal limits. Appendix not clearly visualized. No evidence of bowel wall thickening, distention, or inflammatory changes. Sigmoid diverticulosis.  Vascular/Lymphatic: No significant vascular findings are present. No enlarged abdominal or pelvic lymph nodes.  Reproductive: No mass or other abnormality. Status post hysterectomy.  Other: No abdominal wall hernia or abnormality. A left-sided omental or peritoneal nodule appears enlarged compared to prior examination, measuring 1.3 cm, previously 9 mm (series 2, image 67). There is an increase in soft tissue at the midline ventral incision line, measuring approximately 2.5 x 2.4 cm, previously 2.5 x 1.4 cm when measured similarly (series 2, image 72). Other nodules appear stable. No abdominopelvic ascites.  Musculoskeletal: No acute or significant osseous findings.  IMPRESSION: 1. A  left-sided omental or peritoneal nodule appears enlarged compared to prior examination, measuring 1.3 cm, previously 9 mm (series 2, image 67). There is an increase in soft tissue at the midline ventral incision line, measuring approximately 2.5 x 2.4 cm, previously 2.5 x 1.4 cm when measured similarly (series 2, image 72). Other nodules appear stable. Findings are concerning for worsening omental or peritoneal metastatic disease.  2. Post ablation defect of the  right lobe of the liver is unchanged.  3.  No evidence of metastatic disease in the chest.  4.  Other chronic, incidental, and postoperative findings as above.     Impression and Plan:  68 year old woman with:   1.  Colon cancer diagnosed in 2014 and subsequently developed peritoneal involvement in 2017.  She has been on maintenance chemotherapy with 5-FU and leucovorin.   CT scan obtained on 09/05/2018 was personally reviewed with the patient which shows slight progression of disease.  She has slight increase in her omental nodules enlarging compared to prior examination.  He is measuring 1.3 cm compared to 9 mm.  There is and also soft tissue in the midline ventral incision measuring 2.5 x 2.4 cm which is slightly enlarged from 1.4 cm.  These findings were reviewed today with the patient and treatment options were discussed.  His options would include restarting FOLFIRI and Avastin versus continuing 5-FU leucovorin maintenance.  FOLFOX with Avastin would be also an option given the fact that has been 6 years since she was treated with it.  Risks and benefits and complication associated with all these therapies were reviewed today.  I favor restarting FOLFIRI at this time given her reasonable response in the past tolerance as well as the side effect profile.  After discussion she is agreeable to proceed and will tentatively start in 2 weeks.  For today she will receive only 5-FU, leucovorin and a Avastin maintenance.    2.  IV access: Port-A-Cath continues to be in use and access without any issues   3. Neuropathy: Stable at this time without any changes.  We will avoid Oxaliplatin if possible in the future.  4.  Abdominal discomfort: This could be related to disease progression in her ventral aspect of the abdomen.  Treating her cancer hopefully will improve her pain.  In the meantime I will refill her hydrocodone.  5.  Anxiety: Mood appeared stable at this time although she is  managing anxiety at this time.  6.  Prognosis: Her disease is incurable although aggressive therapy is warranted given her excellent performance status.  7.  Antiemetics: No issues reported with nausea or vomiting.  Compazine is available to her.  8. Followup: Will be in 2 weeks to start FOLFIRI chemotherapy in 4 weeks for MD evaluation.  25 minutes spent today face-to-face with the patient.  More than 50% of today's visit was dedicated to reviewing her disease status, imaging studies, discussing different treatment options as well as complications related to these therapies.   Zola Button MD 09/07/18

## 2018-09-07 NOTE — Telephone Encounter (Signed)
Gave avs and calendar. Informed patient will contact once 4/1 infusion approved

## 2018-09-09 ENCOUNTER — Inpatient Hospital Stay: Payer: Medicare Other

## 2018-09-09 ENCOUNTER — Other Ambulatory Visit: Payer: Self-pay

## 2018-09-09 VITALS — BP 157/92 | HR 71 | Temp 98.4°F | Resp 18

## 2018-09-09 DIAGNOSIS — C786 Secondary malignant neoplasm of retroperitoneum and peritoneum: Secondary | ICD-10-CM | POA: Diagnosis not present

## 2018-09-09 DIAGNOSIS — C787 Secondary malignant neoplasm of liver and intrahepatic bile duct: Secondary | ICD-10-CM | POA: Diagnosis not present

## 2018-09-09 DIAGNOSIS — Z452 Encounter for adjustment and management of vascular access device: Secondary | ICD-10-CM | POA: Diagnosis not present

## 2018-09-09 DIAGNOSIS — C189 Malignant neoplasm of colon, unspecified: Secondary | ICD-10-CM | POA: Diagnosis not present

## 2018-09-09 DIAGNOSIS — G893 Neoplasm related pain (acute) (chronic): Secondary | ICD-10-CM | POA: Diagnosis not present

## 2018-09-09 DIAGNOSIS — K6389 Other specified diseases of intestine: Secondary | ICD-10-CM

## 2018-09-09 DIAGNOSIS — Z5111 Encounter for antineoplastic chemotherapy: Secondary | ICD-10-CM | POA: Diagnosis not present

## 2018-09-09 DIAGNOSIS — K769 Liver disease, unspecified: Secondary | ICD-10-CM

## 2018-09-09 MED ORDER — SODIUM CHLORIDE 0.9% FLUSH
10.0000 mL | INTRAVENOUS | Status: DC | PRN
  Administered 2018-09-09: 10 mL
  Filled 2018-09-09: qty 10

## 2018-09-09 MED ORDER — HEPARIN SOD (PORK) LOCK FLUSH 100 UNIT/ML IV SOLN
500.0000 [IU] | Freq: Once | INTRAVENOUS | Status: AC | PRN
  Administered 2018-09-09: 500 [IU]
  Filled 2018-09-09: qty 5

## 2018-09-21 ENCOUNTER — Other Ambulatory Visit: Payer: Medicare Other

## 2018-09-21 ENCOUNTER — Ambulatory Visit: Payer: Medicare Other | Admitting: Oncology

## 2018-09-21 ENCOUNTER — Inpatient Hospital Stay: Payer: Medicare Other

## 2018-09-23 ENCOUNTER — Inpatient Hospital Stay: Payer: Medicare Other

## 2018-10-05 ENCOUNTER — Inpatient Hospital Stay: Payer: Medicare Other

## 2018-10-05 ENCOUNTER — Inpatient Hospital Stay: Payer: Medicare Other | Attending: Oncology | Admitting: Oncology

## 2018-10-05 ENCOUNTER — Other Ambulatory Visit: Payer: Self-pay

## 2018-10-05 ENCOUNTER — Other Ambulatory Visit: Payer: Self-pay | Admitting: Oncology

## 2018-10-05 ENCOUNTER — Telehealth: Payer: Self-pay | Admitting: Oncology

## 2018-10-05 ENCOUNTER — Ambulatory Visit: Payer: Medicare Other

## 2018-10-05 VITALS — BP 137/95 | HR 89 | Temp 98.1°F | Resp 18 | Ht 64.0 in | Wt 154.4 lb

## 2018-10-05 DIAGNOSIS — Z79899 Other long term (current) drug therapy: Secondary | ICD-10-CM | POA: Diagnosis not present

## 2018-10-05 DIAGNOSIS — R109 Unspecified abdominal pain: Secondary | ICD-10-CM | POA: Diagnosis not present

## 2018-10-05 DIAGNOSIS — Z95828 Presence of other vascular implants and grafts: Secondary | ICD-10-CM

## 2018-10-05 DIAGNOSIS — C786 Secondary malignant neoplasm of retroperitoneum and peritoneum: Secondary | ICD-10-CM | POA: Insufficient documentation

## 2018-10-05 DIAGNOSIS — C787 Secondary malignant neoplasm of liver and intrahepatic bile duct: Principal | ICD-10-CM

## 2018-10-05 DIAGNOSIS — K769 Liver disease, unspecified: Secondary | ICD-10-CM

## 2018-10-05 DIAGNOSIS — C189 Malignant neoplasm of colon, unspecified: Secondary | ICD-10-CM

## 2018-10-05 DIAGNOSIS — Z5111 Encounter for antineoplastic chemotherapy: Secondary | ICD-10-CM | POA: Insufficient documentation

## 2018-10-05 DIAGNOSIS — K6389 Other specified diseases of intestine: Secondary | ICD-10-CM

## 2018-10-05 DIAGNOSIS — T451X5S Adverse effect of antineoplastic and immunosuppressive drugs, sequela: Secondary | ICD-10-CM | POA: Diagnosis not present

## 2018-10-05 DIAGNOSIS — F419 Anxiety disorder, unspecified: Secondary | ICD-10-CM | POA: Diagnosis not present

## 2018-10-05 DIAGNOSIS — Z5112 Encounter for antineoplastic immunotherapy: Secondary | ICD-10-CM | POA: Diagnosis not present

## 2018-10-05 DIAGNOSIS — G620 Drug-induced polyneuropathy: Secondary | ICD-10-CM | POA: Diagnosis not present

## 2018-10-05 DIAGNOSIS — R110 Nausea: Secondary | ICD-10-CM | POA: Insufficient documentation

## 2018-10-05 LAB — CBC WITH DIFFERENTIAL (CANCER CENTER ONLY)
Abs Immature Granulocytes: 0.01 10*3/uL (ref 0.00–0.07)
Basophils Absolute: 0 10*3/uL (ref 0.0–0.1)
Basophils Relative: 0 %
Eosinophils Absolute: 0.2 10*3/uL (ref 0.0–0.5)
Eosinophils Relative: 3 %
HCT: 41.3 % (ref 36.0–46.0)
Hemoglobin: 13.5 g/dL (ref 12.0–15.0)
Immature Granulocytes: 0 %
Lymphocytes Relative: 30 %
Lymphs Abs: 1.4 10*3/uL (ref 0.7–4.0)
MCH: 31.6 pg (ref 26.0–34.0)
MCHC: 32.7 g/dL (ref 30.0–36.0)
MCV: 96.7 fL (ref 80.0–100.0)
Monocytes Absolute: 0.4 10*3/uL (ref 0.1–1.0)
Monocytes Relative: 9 %
Neutro Abs: 2.6 10*3/uL (ref 1.7–7.7)
Neutrophils Relative %: 58 %
Platelet Count: 215 10*3/uL (ref 150–400)
RBC: 4.27 MIL/uL (ref 3.87–5.11)
RDW: 14.2 % (ref 11.5–15.5)
WBC Count: 4.6 10*3/uL (ref 4.0–10.5)
nRBC: 0 % (ref 0.0–0.2)

## 2018-10-05 LAB — CMP (CANCER CENTER ONLY)
ALT: 13 U/L (ref 0–44)
AST: 19 U/L (ref 15–41)
Albumin: 3.5 g/dL (ref 3.5–5.0)
Alkaline Phosphatase: 91 U/L (ref 38–126)
Anion gap: 10 (ref 5–15)
BUN: 18 mg/dL (ref 8–23)
CO2: 22 mmol/L (ref 22–32)
Calcium: 9.3 mg/dL (ref 8.9–10.3)
Chloride: 108 mmol/L (ref 98–111)
Creatinine: 0.88 mg/dL (ref 0.44–1.00)
GFR, Est AFR Am: >60 mL/min (ref >60)
GFR, Estimated: >60 mL/min (ref >60)
Glucose, Bld: 90 mg/dL (ref 70–99)
Potassium: 4.1 mmol/L (ref 3.5–5.1)
Sodium: 140 mmol/L (ref 135–145)
Total Bilirubin: 0.5 mg/dL (ref 0.3–1.2)
Total Protein: 7.9 g/dL (ref 6.5–8.1)

## 2018-10-05 LAB — CEA (IN HOUSE-CHCC): CEA (CHCC-In House): 20.52 ng/mL — ABNORMAL HIGH (ref 0.00–5.00)

## 2018-10-05 MED ORDER — SODIUM CHLORIDE 0.9 % IV SOLN
5.0000 mg/kg | Freq: Once | INTRAVENOUS | Status: AC
  Administered 2018-10-05: 350 mg via INTRAVENOUS
  Filled 2018-10-05: qty 14

## 2018-10-05 MED ORDER — SODIUM CHLORIDE 0.9 % IV SOLN
Freq: Once | INTRAVENOUS | Status: AC
  Administered 2018-10-05: 12:00:00 via INTRAVENOUS
  Filled 2018-10-05: qty 250

## 2018-10-05 MED ORDER — SODIUM CHLORIDE 0.9 % IV SOLN
Freq: Once | INTRAVENOUS | Status: AC
  Administered 2018-10-05: 11:00:00 via INTRAVENOUS
  Filled 2018-10-05: qty 250

## 2018-10-05 MED ORDER — FLUOROURACIL CHEMO INJECTION 2.5 GM/50ML
400.0000 mg/m2 | Freq: Once | INTRAVENOUS | Status: AC
  Administered 2018-10-05: 700 mg via INTRAVENOUS
  Filled 2018-10-05: qty 14

## 2018-10-05 MED ORDER — PALONOSETRON HCL INJECTION 0.25 MG/5ML
0.2500 mg | Freq: Once | INTRAVENOUS | Status: AC
  Administered 2018-10-05: 0.25 mg via INTRAVENOUS

## 2018-10-05 MED ORDER — ATROPINE SULFATE 1 MG/ML IJ SOLN
0.5000 mg | Freq: Once | INTRAMUSCULAR | Status: AC | PRN
  Administered 2018-10-05: 0.5 mg via INTRAVENOUS

## 2018-10-05 MED ORDER — DEXAMETHASONE SODIUM PHOSPHATE 10 MG/ML IJ SOLN
10.0000 mg | Freq: Once | INTRAMUSCULAR | Status: AC
  Administered 2018-10-05: 10 mg via INTRAVENOUS

## 2018-10-05 MED ORDER — SODIUM CHLORIDE 0.9 % IV SOLN: 10.0000 mg | Freq: Once | INTRAVENOUS | Status: DC

## 2018-10-05 MED ORDER — DEXAMETHASONE SODIUM PHOSPHATE 10 MG/ML IJ SOLN
INTRAMUSCULAR | Status: AC
  Filled 2018-10-05: qty 1

## 2018-10-05 MED ORDER — SODIUM CHLORIDE 0.9 % IV SOLN
2400.0000 mg/m2 | INTRAVENOUS | Status: DC
  Administered 2018-10-05: 14:00:00 4300 mg via INTRAVENOUS
  Filled 2018-10-05: qty 86

## 2018-10-05 MED ORDER — LEUCOVORIN CALCIUM INJECTION 350 MG
400.0000 mg/m2 | Freq: Once | INTRAVENOUS | Status: AC
  Administered 2018-10-05: 716 mg via INTRAVENOUS
  Filled 2018-10-05: qty 35.8

## 2018-10-05 MED ORDER — PALONOSETRON HCL INJECTION 0.25 MG/5ML
INTRAVENOUS | Status: AC
  Filled 2018-10-05: qty 5

## 2018-10-05 MED ORDER — SODIUM CHLORIDE 0.9% FLUSH
10.0000 mL | Freq: Once | INTRAVENOUS | Status: AC
  Administered 2018-10-05: 10:00:00 10 mL
  Filled 2018-10-05: qty 10

## 2018-10-05 MED ORDER — ATROPINE SULFATE 1 MG/ML IJ SOLN
INTRAMUSCULAR | Status: AC
  Filled 2018-10-05: qty 1

## 2018-10-05 MED ORDER — IRINOTECAN HCL CHEMO INJECTION 100 MG/5ML
180.0000 mg/m2 | Freq: Once | INTRAVENOUS | Status: AC
  Administered 2018-10-05: 320 mg via INTRAVENOUS
  Filled 2018-10-05: qty 5

## 2018-10-05 NOTE — Patient Instructions (Signed)
[facility] Discharge Instructions for Patients Receiving Chemotherapy  Today you received the following chemotherapy agents :  Avastin, Leucovorin, Fluorouracil.  To help prevent nausea and vomiting after your treatment, we encourage you to take your nausea medication as prescribed.   If you develop nausea and vomiting that is not controlled by your nausea medication, call the clinic.   BELOW ARE SYMPTOMS THAT SHOULD BE REPORTED IMMEDIATELY:  *FEVER GREATER THAN 100.5 F  *CHILLS WITH OR WITHOUT FEVER  NAUSEA AND VOMITING THAT IS NOT CONTROLLED WITH YOUR NAUSEA MEDICATION  *UNUSUAL SHORTNESS OF BREATH  *UNUSUAL BRUISING OR BLEEDING  TENDERNESS IN MOUTH AND THROAT WITH OR WITHOUT PRESENCE OF ULCERS  *URINARY PROBLEMS  *BOWEL PROBLEMS  UNUSUAL RASH Items with * indicate a potential emergency and should be followed up as soon as possible.  Feel free to call the clinic should you have any questions or concerns. The clinic phone number is [phone].  Please show the [facility] at check-in to the Emergency Department and triage nurse.

## 2018-10-05 NOTE — Progress Notes (Signed)
Hematology and Oncology Follow Up Visit  Margaret Elliott [id number] 1951/01/23    10/05/18   Principle Diagnosis: 68 year old woman with colon cancer diagnosed in 2014.  She was found to have stage IV with hepatic metastasis.  She developed peritoneal metastasis in 2017 that is biopsy-proven to show microsatellite stable,  NRAS mutated tumor.   Prior Therapy:  She is status post laparoscopic laparotomy right hemicolectomy with ileocolonic anastomosis done on December 09, 2012.   FOLFOX and a Avastin chemotherapy started 01/18/2013. She is S/P 12 cycles completed in 05/2013.   She is a status post microwave ablation of metastatic lesion in the posterior segment of the right lobe of the liver completed on 09/28/2014 and repeated on 11/30/2014.  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  FOLFIRI and a Avastin salvage therapy started on 12/10/2015.  Therapy discontinued and maintained on 5-FU leucovorin with a Avastin only till March 2020.  Current therapy:     FOLFIRI and a Avastin restarted on 10/05/2018.   Interim History:  Margaret Elliott is here for a repeat evaluation.  Since the last visit, he reports no major changes in her health.  She tolerated the last cycle of chemotherapy without any complaints.  She is set to restart FOLFIRI and Avastin today after she has been receiving maintenance 5-FU and leucovorin.  She denies any nausea, fatigue or infusion related complications.  She denies respiratory issues including cough, wheezing or hemoptysis.  She denies any worsening abdominal pain or distention.  She denied any alteration mental status, neuropathy, confusion or dizziness.  Denies any headaches or lethargy.  Denies any night sweats, weight loss or changes in appetite.  Denied orthopnea, dyspnea on exertion or chest discomfort.  Denies shortness of breath, difficulty breathing hemoptysis or cough.  Denies any  abdominal distention, nausea, early satiety or dyspepsia.  Denies any hematuria, frequency, dysuria or nocturia.  Denies any skin irritation, dryness or rash.  Denies any ecchymosis or petechiae.  Denies any lymphadenopathy or clotting.  Denies any heat or cold intolerance.  Denies any anxiety or depression.  Remaining review of system is negative.           Medications: I have reviewed the patient's current medications.  Current Outpatient Prescriptions  Medication Sig Dispense Refill  . acetaminophen (TYLENOL) 500 MG tablet Take 500 mg by mouth every 6 (six) hours as needed for pain.      . diphenhydrAMINE (BENADRYL) 25 mg capsule Take 25-50 mg by mouth daily as needed for itching. For allergic reaction      . lidocaine-prilocaine (EMLA) cream Apply 1 application topically as needed. Apply approx 1/2 tsp to skin over port, prior to chemotherapy treatments  1 kit  3  . Multiple Vitamin (MULTIVITAMIN) tablet Take 1 tablet by mouth daily.      . ondansetron (ZOFRAN) 8 MG tablet Take 1 tablet (8 mg total) by mouth every 8 (eight) hours as needed for nausea.  30 tablet  1   No current facility-administered medications for this visit.     Allergies: No Known Allergies  Past Medical History, Surgical history, Social history, and Family History reviewed today and remain unchanged.    Physical Exam:    Blood pressure (!) 137/95, pulse 89, temperature 98.1 F (36.7 C), temperature source Oral, resp. rate 18, height '5\' 4"'$  (1.626 m), weight 154 lb 6.4 oz (70 kg), SpO2 100 %.  ECOG: 0    General appearance: Alert, awake without any distress. Head: Atraumatic without abnormalities Oropharynx: Without any thrush or ulcers. Eyes: No scleral icterus. Lymph nodes: No lymphadenopathy noted in the cervical, supraclavicular, or axillary nodes Heart:regular rate and rhythm, without any murmurs or gallops.   Lung: Clear to auscultation without any rhonchi, wheezes or dullness to  percussion. Abdomin: Soft, nontender without any shifting dullness or ascites. Musculoskeletal: No clubbing or cyanosis. Neurological: No motor or sensory deficits. Skin: No rashes or lesions.          CBC    Component Value Date/Time   WBC 4.8 09/07/2018 0913   WBC 4.2 09/08/2017 0834   RBC 4.15 09/07/2018 0913   HGB 13.3 09/07/2018 0913   HGB 12.3 05/26/2017 0809   HCT 41.0 09/07/2018 0913   HCT 37.6 05/26/2017 0809   PLT 256 09/07/2018 0913   PLT 180 05/26/2017 0809   MCV 98.8 09/07/2018 0913   MCV 100.3 05/26/2017 0809   MCH 32.0 09/07/2018 0913   MCHC 32.4 09/07/2018 0913   RDW 14.2 09/07/2018 0913   RDW 17.4 (H) 05/26/2017 0809   LYMPHSABS 1.6 09/07/2018 0913   LYMPHSABS 1.2 05/26/2017 0809   MONOABS 0.5 09/07/2018 0913   MONOABS 0.5 05/26/2017 0809   EOSABS 0.1 09/07/2018 0913   EOSABS 0.1 05/26/2017 0809   BASOSABS 0.0 09/07/2018 0913   BASOSABS 0.0 05/26/2017 0809   Results for Margaret Elliott, Margaret Elliott (MRN [id number]) as of 10/05/2018 10:14  Ref. Range 07/06/2018 08:39 08/10/2018 08:35  [facility] ([facility]) Latest Ref Range: 0.00 - 5.00 ng/mL 15.08 (H) 12.93 (H)     Impression and Plan:  68 year old woman with:   1.  Stage IV colon cancer with peritoneal involvement documented in 2017 after initial diagnosis in 2014.     She has been on maintenance 5-FU and Avastin and was restarted on FOLFIRI and Avastin to be started today.  Risks and benefits of continuing this treatment and long-term complications were reiterated.  These complications include nausea, fatigue, diarrhea and myelosuppression.  After discussion today she is agreeable to continue.  Repeat imaging studies will be done in 2 months.    2.  IV access: No issues reported with her Port-A-Cath.   3. 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  4.  Abdominal discomfort: No issues reported at this time and mostly of pain related to tumor progression.  5.  Anxiety: Mood is  relatively stable.  6.  Prognosis: Therapy remains palliative although her performance status is excellent and aggressive therapy is warranted.  7.  Antiemetics: No issues reported with nausea or vomiting.  8. Followup: Will be in 2 weeks for her next cycle of chemotherapy.  25 minutes spent today face-to-face with the patient.  More than 50% of today's visit was spent on updating her disease status, treatment options, complications related to therapy and addressing future plan of care.   Zola Button MD 10/05/18

## 2018-10-05 NOTE — Progress Notes (Signed)
Per Dr Alen Blew ok to treat today with elevated BP and no urine protein lab

## 2018-10-05 NOTE — Telephone Encounter (Signed)
Scheduled appt per 4/15 sch message - pt to get an updated schedule next visit.   

## 2018-10-07 ENCOUNTER — Other Ambulatory Visit: Payer: Self-pay

## 2018-10-07 ENCOUNTER — Inpatient Hospital Stay: Payer: Medicare Other

## 2018-10-07 VITALS — BP 111/72 | HR 80 | Temp 98.5°F | Resp 18

## 2018-10-07 DIAGNOSIS — Z5112 Encounter for antineoplastic immunotherapy: Secondary | ICD-10-CM | POA: Diagnosis not present

## 2018-10-07 DIAGNOSIS — K6389 Other specified diseases of intestine: Secondary | ICD-10-CM

## 2018-10-07 DIAGNOSIS — C787 Secondary malignant neoplasm of liver and intrahepatic bile duct: Secondary | ICD-10-CM | POA: Diagnosis not present

## 2018-10-07 DIAGNOSIS — C189 Malignant neoplasm of colon, unspecified: Secondary | ICD-10-CM

## 2018-10-07 DIAGNOSIS — C786 Secondary malignant neoplasm of retroperitoneum and peritoneum: Secondary | ICD-10-CM | POA: Diagnosis not present

## 2018-10-07 DIAGNOSIS — R110 Nausea: Secondary | ICD-10-CM | POA: Diagnosis not present

## 2018-10-07 DIAGNOSIS — K769 Liver disease, unspecified: Secondary | ICD-10-CM

## 2018-10-07 DIAGNOSIS — Z5111 Encounter for antineoplastic chemotherapy: Secondary | ICD-10-CM | POA: Diagnosis not present

## 2018-10-07 MED ORDER — HEPARIN SOD (PORK) LOCK FLUSH 100 UNIT/ML IV SOLN
500.0000 [IU] | Freq: Once | INTRAVENOUS | Status: AC | PRN
  Administered 2018-10-07: 13:00:00 500 [IU]
  Filled 2018-10-07: qty 5

## 2018-10-07 MED ORDER — SODIUM CHLORIDE 0.9% FLUSH
10.0000 mL | INTRAVENOUS | Status: DC | PRN
  Administered 2018-10-07: 10 mL
  Filled 2018-10-07: qty 10

## 2018-10-19 ENCOUNTER — Inpatient Hospital Stay (HOSPITAL_BASED_OUTPATIENT_CLINIC_OR_DEPARTMENT_OTHER): Payer: Medicare Other | Admitting: Oncology

## 2018-10-19 ENCOUNTER — Other Ambulatory Visit: Payer: Self-pay

## 2018-10-19 ENCOUNTER — Inpatient Hospital Stay: Payer: Medicare Other

## 2018-10-19 ENCOUNTER — Telehealth: Payer: Self-pay | Admitting: Oncology

## 2018-10-19 VITALS — BP 163/95 | HR 80 | Temp 97.5°F | Resp 18 | Ht 64.0 in | Wt 156.1 lb

## 2018-10-19 DIAGNOSIS — T451X5S Adverse effect of antineoplastic and immunosuppressive drugs, sequela: Secondary | ICD-10-CM | POA: Diagnosis not present

## 2018-10-19 DIAGNOSIS — R109 Unspecified abdominal pain: Secondary | ICD-10-CM

## 2018-10-19 DIAGNOSIS — C189 Malignant neoplasm of colon, unspecified: Secondary | ICD-10-CM | POA: Diagnosis not present

## 2018-10-19 DIAGNOSIS — R110 Nausea: Secondary | ICD-10-CM

## 2018-10-19 DIAGNOSIS — Z79899 Other long term (current) drug therapy: Secondary | ICD-10-CM

## 2018-10-19 DIAGNOSIS — Z5112 Encounter for antineoplastic immunotherapy: Secondary | ICD-10-CM | POA: Diagnosis not present

## 2018-10-19 DIAGNOSIS — F419 Anxiety disorder, unspecified: Secondary | ICD-10-CM | POA: Diagnosis not present

## 2018-10-19 DIAGNOSIS — K769 Liver disease, unspecified: Secondary | ICD-10-CM

## 2018-10-19 DIAGNOSIS — C786 Secondary malignant neoplasm of retroperitoneum and peritoneum: Secondary | ICD-10-CM

## 2018-10-19 DIAGNOSIS — G620 Drug-induced polyneuropathy: Secondary | ICD-10-CM

## 2018-10-19 DIAGNOSIS — Z5111 Encounter for antineoplastic chemotherapy: Secondary | ICD-10-CM | POA: Diagnosis not present

## 2018-10-19 DIAGNOSIS — C787 Secondary malignant neoplasm of liver and intrahepatic bile duct: Secondary | ICD-10-CM

## 2018-10-19 DIAGNOSIS — K6389 Other specified diseases of intestine: Secondary | ICD-10-CM

## 2018-10-19 DIAGNOSIS — Z95828 Presence of other vascular implants and grafts: Secondary | ICD-10-CM

## 2018-10-19 LAB — CBC WITH DIFFERENTIAL (CANCER CENTER ONLY)
Abs Immature Granulocytes: 0 10*3/uL (ref 0.00–0.07)
Basophils Absolute: 0 10*3/uL (ref 0.0–0.1)
Basophils Relative: 1 %
Eosinophils Absolute: 0.1 10*3/uL (ref 0.0–0.5)
Eosinophils Relative: 4 %
HCT: 37.1 % (ref 36.0–46.0)
Hemoglobin: 12 g/dL (ref 12.0–15.0)
Immature Granulocytes: 0 %
Lymphocytes Relative: 34 %
Lymphs Abs: 1.3 10*3/uL (ref 0.7–4.0)
MCH: 31.5 pg (ref 26.0–34.0)
MCHC: 32.3 g/dL (ref 30.0–36.0)
MCV: 97.4 fL (ref 80.0–100.0)
Monocytes Absolute: 0.3 10*3/uL (ref 0.1–1.0)
Monocytes Relative: 8 %
Neutro Abs: 2 10*3/uL (ref 1.7–7.7)
Neutrophils Relative %: 53 %
Platelet Count: 201 10*3/uL (ref 150–400)
RBC: 3.81 MIL/uL — ABNORMAL LOW (ref 3.87–5.11)
RDW: 14.1 % (ref 11.5–15.5)
WBC Count: 3.7 10*3/uL — ABNORMAL LOW (ref 4.0–10.5)
nRBC: 0 % (ref 0.0–0.2)

## 2018-10-19 LAB — CMP (CANCER CENTER ONLY)
ALT: 11 U/L (ref 0–44)
AST: 18 U/L (ref 15–41)
Albumin: 3.4 g/dL — ABNORMAL LOW (ref 3.5–5.0)
Alkaline Phosphatase: 86 U/L (ref 38–126)
Anion gap: 8 (ref 5–15)
BUN: 15 mg/dL (ref 8–23)
CO2: 24 mmol/L (ref 22–32)
Calcium: 9.1 mg/dL (ref 8.9–10.3)
Chloride: 108 mmol/L (ref 98–111)
Creatinine: 1 mg/dL (ref 0.44–1.00)
GFR, Est AFR Am: >60 mL/min (ref >60)
GFR, Estimated: 58 mL/min — ABNORMAL LOW (ref >60)
Glucose, Bld: 84 mg/dL (ref 70–99)
Potassium: 3.7 mmol/L (ref 3.5–5.1)
Sodium: 140 mmol/L (ref 135–145)
Total Bilirubin: 0.5 mg/dL (ref 0.3–1.2)
Total Protein: 7.4 g/dL (ref 6.5–8.1)

## 2018-10-19 LAB — CEA (IN HOUSE-CHCC): CEA (CHCC-In House): 21.16 ng/mL — ABNORMAL HIGH (ref 0.00–5.00)

## 2018-10-19 MED ORDER — FLUOROURACIL CHEMO INJECTION 2.5 GM/50ML
400.0000 mg/m2 | Freq: Once | INTRAVENOUS | Status: AC
  Administered 2018-10-19: 12:00:00 700 mg via INTRAVENOUS
  Filled 2018-10-19: qty 14

## 2018-10-19 MED ORDER — DEXAMETHASONE SODIUM PHOSPHATE 10 MG/ML IJ SOLN
10.0000 mg | Freq: Once | INTRAMUSCULAR | Status: AC
  Administered 2018-10-19: 10 mg via INTRAVENOUS

## 2018-10-19 MED ORDER — SODIUM CHLORIDE 0.9 % IV SOLN
Freq: Once | INTRAVENOUS | Status: AC
  Administered 2018-10-19: 09:00:00 via INTRAVENOUS
  Filled 2018-10-19: qty 250

## 2018-10-19 MED ORDER — ATROPINE SULFATE 1 MG/ML IJ SOLN
0.5000 mg | Freq: Once | INTRAMUSCULAR | Status: AC | PRN
  Administered 2018-10-19: 0.5 mg via INTRAVENOUS

## 2018-10-19 MED ORDER — LORAZEPAM 1 MG PO TABS
1.0000 mg | ORAL_TABLET | Freq: Once | ORAL | Status: AC
  Administered 2018-10-19: 1 mg via ORAL

## 2018-10-19 MED ORDER — LORAZEPAM 1 MG PO TABS
ORAL_TABLET | ORAL | Status: AC
  Filled 2018-10-19: qty 1

## 2018-10-19 MED ORDER — ATROPINE SULFATE 1 MG/ML IJ SOLN
INTRAMUSCULAR | Status: AC
  Filled 2018-10-19: qty 1

## 2018-10-19 MED ORDER — SODIUM CHLORIDE 0.9% FLUSH
10.0000 mL | Freq: Once | INTRAVENOUS | Status: AC
  Administered 2018-10-19: 10 mL
  Filled 2018-10-19: qty 10

## 2018-10-19 MED ORDER — IRINOTECAN HCL CHEMO INJECTION 100 MG/5ML
180.0000 mg/m2 | Freq: Once | INTRAVENOUS | Status: AC
  Administered 2018-10-19: 11:00:00 320 mg via INTRAVENOUS
  Filled 2018-10-19: qty 15

## 2018-10-19 MED ORDER — SODIUM CHLORIDE 0.9 % IV SOLN
Freq: Once | INTRAVENOUS | Status: AC
  Administered 2018-10-19: 10:00:00 via INTRAVENOUS
  Filled 2018-10-19: qty 250

## 2018-10-19 MED ORDER — PALONOSETRON HCL INJECTION 0.25 MG/5ML
INTRAVENOUS | Status: AC
  Filled 2018-10-19: qty 5

## 2018-10-19 MED ORDER — LEUCOVORIN CALCIUM INJECTION 350 MG
400.0000 mg/m2 | Freq: Once | INTRAVENOUS | Status: AC
  Administered 2018-10-19: 716 mg via INTRAVENOUS
  Filled 2018-10-19: qty 35.8

## 2018-10-19 MED ORDER — SODIUM CHLORIDE 0.9% FLUSH
10.0000 mL | INTRAVENOUS | Status: DC | PRN
  Filled 2018-10-19: qty 10

## 2018-10-19 MED ORDER — DEXAMETHASONE SODIUM PHOSPHATE 10 MG/ML IJ SOLN
INTRAMUSCULAR | Status: AC
  Filled 2018-10-19: qty 1

## 2018-10-19 MED ORDER — PALONOSETRON HCL INJECTION 0.25 MG/5ML
0.2500 mg | Freq: Once | INTRAVENOUS | Status: AC
  Administered 2018-10-19: 0.25 mg via INTRAVENOUS

## 2018-10-19 MED ORDER — SODIUM CHLORIDE 0.9 % IV SOLN
2400.0000 mg/m2 | INTRAVENOUS | Status: DC
  Administered 2018-10-19: 4300 mg via INTRAVENOUS
  Filled 2018-10-19: qty 86

## 2018-10-19 MED ORDER — SODIUM CHLORIDE 0.9 % IV SOLN
5.0000 mg/kg | Freq: Once | INTRAVENOUS | Status: AC
  Administered 2018-10-19: 10:00:00 350 mg via INTRAVENOUS
  Filled 2018-10-19: qty 14

## 2018-10-19 NOTE — Progress Notes (Signed)
Pt states she received ativan 1mg  PO previously as part of her premedications in her treatment plan.  Pt wanted to know if she could have this added to her treatment plan for subsequent visits and also if she could get a dose during her treatment today. Per Dr. Alen Blew, ok to give dose today (see MAR), and he will add this as a premed to her subsequent treatment plans.

## 2018-10-19 NOTE — Progress Notes (Signed)
Hematology and Oncology Follow Up Visit  Margaret Elliott [id number] [birth year]    10/19/18   Principle Diagnosis: 68 year old woman with stage IV colon cancer diagnosed in 2014.  She had initially liver metastasis and subsequently developed peritoneal metastasis in 2017 that is biopsy-proven to show microsatellite stable,  NRAS mutated tumor.   Prior Therapy:  She is status post laparoscopic laparotomy right hemicolectomy with ileocolonic anastomosis done on December 09, 2012.   FOLFOX and a Avastin chemotherapy started 01/18/2013. She is S/P 12 cycles completed in 05/2013.   She is a status post microwave ablation of metastatic lesion in the posterior segment of the right lobe of the liver completed on 09/28/2014 and repeated on 11/30/2014.  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  FOLFIRI and a Avastin salvage therapy started on 12/10/2015.  Therapy discontinued and maintained on 5-FU leucovorin with a Avastin only till March 2020.  Current therapy:     FOLFIRI and a Avastin restarted on 10/05/2018.  He is here for the next cycle of therapy.   Interim History:  Margaret Elliott returns today for a follow-up.  Since the last visit, since the last visit, she reports no major concerns or issues.  She tolerated the last cycle without any new complications.  She did report some mild nausea that is manageable without any increased abdominal pain or discomfort.  Performance status and quality of life remain excellent.  No worsening neuropathy noted.   Patient denied headaches, blurry vision, syncope or seizures.  Denies any fevers, chills or sweats.  Denied chest pain, palpitation, orthopnea or leg edema.  Denied cough, wheezing or hemoptysis.  Denied nausea, vomiting or abdominal pain.  Denies any constipation or diarrhea.  Denies any frequency urgency or hesitancy.  Denies any arthralgias or myalgias.  Denies any skin  rashes or lesions.  Denies any bleeding or clotting tendency.  Denies any easy bruising.  Denies any hair or nail changes.  Denies any anxiety or depression.  Remaining review of system is negative.            Medications: I have reviewed the patient's current medications.  Current Outpatient Prescriptions  Medication Sig Dispense Refill  . acetaminophen (TYLENOL) 500 MG tablet Take 500 mg by mouth every 6 (six) hours as needed for pain.      . diphenhydrAMINE (BENADRYL) 25 mg capsule Take 25-50 mg by mouth daily as needed for itching. For allergic reaction      . lidocaine-prilocaine (EMLA) cream Apply 1 application topically as needed. Apply approx 1/2 tsp to skin over port, prior to chemotherapy treatments  1 kit  3  . Multiple Vitamin (MULTIVITAMIN) tablet Take 1 tablet by mouth daily.      . ondansetron (ZOFRAN) 8 MG tablet Take 1 tablet (8 mg total) by mouth every 8 (eight) hours as needed for nausea.  30 tablet  1   No current facility-administered medications for this visit.     Allergies: No Known Allergies  Past Medical History, Surgical history, Social history, and Family History reviewed today and remain unchanged.    Physical Exam:  Blood pressure (!) 163/95, pulse 80, temperature (!) 97.5 F (36.4 C), temperature source Oral, resp. rate 18, height '5\' 4"'$  (1.626 m), weight 156 lb 1.6 oz (70.8 kg), SpO2 96 %.  ECOG: 0 General appearance: Comfortable appearing without any discomfort Head: Normocephalic without any trauma Oropharynx: Mucous membranes are moist and pink  without any thrush or ulcers. Eyes: Pupils are equal and round reactive to light. Lymph nodes: No cervical, supraclavicular, inguinal or axillary lymphadenopathy.   Heart:regular rate and rhythm.  S1 and S2 without leg edema. Lung: Clear without any rhonchi or wheezes.  No dullness to percussion. Abdomin: Soft, nontender, nondistended with good bowel sounds.  No hepatosplenomegaly. Musculoskeletal:  No joint deformity or effusion.  Full range of motion noted. Neurological: No deficits noted on motor, sensory and deep tendon reflex exam. Skin: No petechial rash or dryness.  Appeared moist.            CBC    Component Value Date/Time   WBC 4.6 10/05/2018 1007   WBC 4.2 09/08/2017 0834   RBC 4.27 10/05/2018 1007   HGB 13.5 10/05/2018 1007   HGB 12.3 05/26/2017 0809   HCT 41.3 10/05/2018 1007   HCT 37.6 05/26/2017 0809   PLT 215 10/05/2018 1007   PLT 180 05/26/2017 0809   MCV 96.7 10/05/2018 1007   MCV 100.3 05/26/2017 0809   MCH 31.6 10/05/2018 1007   MCHC 32.7 10/05/2018 1007   RDW 14.2 10/05/2018 1007   RDW 17.4 (H) 05/26/2017 0809   LYMPHSABS 1.4 10/05/2018 1007   LYMPHSABS 1.2 05/26/2017 0809   MONOABS 0.4 10/05/2018 1007   MONOABS 0.5 05/26/2017 0809   EOSABS 0.2 10/05/2018 1007   EOSABS 0.1 05/26/2017 0809   BASOSABS 0.0 10/05/2018 1007   BASOSABS 0.0 05/26/2017 0809    Results for Margaret, Elliott (MRN [id number]) as of 10/19/2018 08:42  Ref. Range 08/10/2018 08:35 10/05/2018 10:07  [facility] ([facility]) Latest Ref Range: 0.00 - 5.00 ng/mL 12.93 (H) 20.52 (H)     Impression and Plan:  68 year old woman with:   1.  Colon cancer diagnosed in 2014 with stage IV disease including hepatic and peritoneal metastasis.   She was restarted on FOLFIRI and Avastin on 10/05/2018 it is reasonably tolerated.  Risks and benefits of continuing this therapy versus alternative options were reviewed.  Complications and include nausea, vomiting, myelosuppression among others were reviewed.  Alternative therapies were also reviewed including oral targeted therapy as well as oxalicplatin based regimens.  For the time being she is agreeable to continue.    2.  IV access: Port-A-Cath remains in place without any concerns.   3. Neuropathy: No recent exacerbation on the current chemotherapy.  4.  Abdominal discomfort: Very limited GI concerns at this time.  Her discomfort is  related scar tissue and tumor progression.  5.  Anxiety: No recent exacerbation of her anxiety or depression.  Manageable at this time.  6.  Prognosis: Her disease is incurable but aggressive therapy is warranted.  7.  Antiemetics: Manageable with antiemetics at this time.  No nausea or vomiting.  8. Followup: We will be in 2 weeks for the next cycle of therapy.  25 minutes spent today face-to-face with the patient.  More than 50% of today's visit was dedicated to reviewing her disease status, complications related to chemotherapy and answering questions regarding future treatment options.   Zola Button MD 10/19/18

## 2018-10-19 NOTE — Telephone Encounter (Signed)
Scheduled appt per 4/29 sch message.

## 2018-10-19 NOTE — Patient Instructions (Signed)
[facility] Discharge Instructions for Patients Receiving Chemotherapy  Today you received the following chemotherapy agents :  Avastin, Leucovorin, Fluorouracil.  To help prevent nausea and vomiting after your treatment, we encourage you to take your nausea medication as prescribed.   If you develop nausea and vomiting that is not controlled by your nausea medication, call the clinic.   BELOW ARE SYMPTOMS THAT SHOULD BE REPORTED IMMEDIATELY:  *FEVER GREATER THAN 100.5 F  *CHILLS WITH OR WITHOUT FEVER  NAUSEA AND VOMITING THAT IS NOT CONTROLLED WITH YOUR NAUSEA MEDICATION  *UNUSUAL SHORTNESS OF BREATH  *UNUSUAL BRUISING OR BLEEDING  TENDERNESS IN MOUTH AND THROAT WITH OR WITHOUT PRESENCE OF ULCERS  *URINARY PROBLEMS  *BOWEL PROBLEMS  UNUSUAL RASH Items with * indicate a potential emergency and should be followed up as soon as possible.  Feel free to call the clinic should you have any questions or concerns. The clinic phone number is [phone].  Please show the [facility] at check-in to the Emergency Department and triage nurse.

## 2018-10-21 ENCOUNTER — Other Ambulatory Visit: Payer: Self-pay

## 2018-10-21 ENCOUNTER — Inpatient Hospital Stay: Payer: Medicare Other | Attending: Oncology

## 2018-10-21 DIAGNOSIS — Z7689 Persons encountering health services in other specified circumstances: Secondary | ICD-10-CM | POA: Diagnosis not present

## 2018-10-21 DIAGNOSIS — R110 Nausea: Secondary | ICD-10-CM | POA: Diagnosis not present

## 2018-10-21 DIAGNOSIS — Z5111 Encounter for antineoplastic chemotherapy: Secondary | ICD-10-CM | POA: Insufficient documentation

## 2018-10-21 DIAGNOSIS — F419 Anxiety disorder, unspecified: Secondary | ICD-10-CM | POA: Diagnosis not present

## 2018-10-21 DIAGNOSIS — Z79899 Other long term (current) drug therapy: Secondary | ICD-10-CM | POA: Insufficient documentation

## 2018-10-21 DIAGNOSIS — C787 Secondary malignant neoplasm of liver and intrahepatic bile duct: Secondary | ICD-10-CM | POA: Insufficient documentation

## 2018-10-21 DIAGNOSIS — C786 Secondary malignant neoplasm of retroperitoneum and peritoneum: Secondary | ICD-10-CM | POA: Diagnosis not present

## 2018-10-21 DIAGNOSIS — Z5112 Encounter for antineoplastic immunotherapy: Secondary | ICD-10-CM | POA: Insufficient documentation

## 2018-10-21 DIAGNOSIS — Z95828 Presence of other vascular implants and grafts: Secondary | ICD-10-CM | POA: Diagnosis not present

## 2018-10-21 DIAGNOSIS — C189 Malignant neoplasm of colon, unspecified: Secondary | ICD-10-CM | POA: Diagnosis not present

## 2018-10-21 MED ORDER — HEPARIN SOD (PORK) LOCK FLUSH 100 UNIT/ML IV SOLN
500.0000 [IU] | Freq: Once | INTRAVENOUS | Status: AC
  Administered 2018-10-21: 11:00:00 500 [IU] via INTRAVENOUS
  Filled 2018-10-21: qty 5

## 2018-10-21 MED ORDER — SODIUM CHLORIDE 0.9% FLUSH
10.0000 mL | INTRAVENOUS | Status: DC | PRN
  Administered 2018-10-21: 10 mL via INTRAVENOUS
  Filled 2018-10-21: qty 10

## 2018-10-28 ENCOUNTER — Other Ambulatory Visit: Payer: Self-pay | Admitting: Oncology

## 2018-11-02 ENCOUNTER — Other Ambulatory Visit: Payer: Self-pay

## 2018-11-02 ENCOUNTER — Inpatient Hospital Stay: Payer: Medicare Other

## 2018-11-02 ENCOUNTER — Inpatient Hospital Stay (HOSPITAL_BASED_OUTPATIENT_CLINIC_OR_DEPARTMENT_OTHER): Payer: Medicare Other | Admitting: Oncology

## 2018-11-02 VITALS — BP 124/83 | HR 82 | Temp 98.0°F | Resp 16

## 2018-11-02 VITALS — BP 140/91 | HR 87 | Temp 98.3°F | Resp 18 | Ht 64.0 in | Wt 153.8 lb

## 2018-11-02 DIAGNOSIS — C787 Secondary malignant neoplasm of liver and intrahepatic bile duct: Secondary | ICD-10-CM | POA: Diagnosis not present

## 2018-11-02 DIAGNOSIS — Z95828 Presence of other vascular implants and grafts: Secondary | ICD-10-CM | POA: Diagnosis not present

## 2018-11-02 DIAGNOSIS — C189 Malignant neoplasm of colon, unspecified: Secondary | ICD-10-CM

## 2018-11-02 DIAGNOSIS — C786 Secondary malignant neoplasm of retroperitoneum and peritoneum: Secondary | ICD-10-CM | POA: Diagnosis not present

## 2018-11-02 DIAGNOSIS — Z7689 Persons encountering health services in other specified circumstances: Secondary | ICD-10-CM | POA: Diagnosis not present

## 2018-11-02 DIAGNOSIS — K769 Liver disease, unspecified: Secondary | ICD-10-CM

## 2018-11-02 DIAGNOSIS — Z5112 Encounter for antineoplastic immunotherapy: Secondary | ICD-10-CM | POA: Diagnosis not present

## 2018-11-02 DIAGNOSIS — Z79899 Other long term (current) drug therapy: Secondary | ICD-10-CM

## 2018-11-02 DIAGNOSIS — Z5111 Encounter for antineoplastic chemotherapy: Secondary | ICD-10-CM | POA: Diagnosis not present

## 2018-11-02 DIAGNOSIS — K6389 Other specified diseases of intestine: Secondary | ICD-10-CM

## 2018-11-02 LAB — CBC WITH DIFFERENTIAL (CANCER CENTER ONLY)
Abs Immature Granulocytes: 0 10*3/uL (ref 0.00–0.07)
Basophils Absolute: 0 10*3/uL (ref 0.0–0.1)
Basophils Relative: 0 %
Eosinophils Absolute: 0.1 10*3/uL (ref 0.0–0.5)
Eosinophils Relative: 2 %
HCT: 36.9 % (ref 36.0–46.0)
Hemoglobin: 11.8 g/dL — ABNORMAL LOW (ref 12.0–15.0)
Immature Granulocytes: 0 %
Lymphocytes Relative: 48 %
Lymphs Abs: 1.2 10*3/uL (ref 0.7–4.0)
MCH: 32 pg (ref 26.0–34.0)
MCHC: 32 g/dL (ref 30.0–36.0)
MCV: 100 fL (ref 80.0–100.0)
Monocytes Absolute: 0.2 10*3/uL (ref 0.1–1.0)
Monocytes Relative: 9 %
Neutro Abs: 1.1 10*3/uL — ABNORMAL LOW (ref 1.7–7.7)
Neutrophils Relative %: 41 %
Platelet Count: 215 10*3/uL (ref 150–400)
RBC: 3.69 MIL/uL — ABNORMAL LOW (ref 3.87–5.11)
RDW: 15.1 % (ref 11.5–15.5)
WBC Count: 2.5 10*3/uL — ABNORMAL LOW (ref 4.0–10.5)
nRBC: 0 % (ref 0.0–0.2)

## 2018-11-02 LAB — CMP (CANCER CENTER ONLY)
ALT: 10 U/L (ref 0–44)
AST: 15 U/L (ref 15–41)
Albumin: 3.3 g/dL — ABNORMAL LOW (ref 3.5–5.0)
Alkaline Phosphatase: 78 U/L (ref 38–126)
Anion gap: 6 (ref 5–15)
BUN: 14 mg/dL (ref 8–23)
CO2: 26 mmol/L (ref 22–32)
Calcium: 9 mg/dL (ref 8.9–10.3)
Chloride: 109 mmol/L (ref 98–111)
Creatinine: 1.12 mg/dL — ABNORMAL HIGH (ref 0.44–1.00)
GFR, Est AFR Am: 58 mL/min — ABNORMAL LOW (ref >60)
GFR, Estimated: 50 mL/min — ABNORMAL LOW (ref >60)
Glucose, Bld: 103 mg/dL — ABNORMAL HIGH (ref 70–99)
Potassium: 3.9 mmol/L (ref 3.5–5.1)
Sodium: 141 mmol/L (ref 135–145)
Total Bilirubin: 0.4 mg/dL (ref 0.3–1.2)
Total Protein: 7.2 g/dL (ref 6.5–8.1)

## 2018-11-02 LAB — TOTAL PROTEIN, URINE DIPSTICK: Protein, ur: NEGATIVE mg/dL

## 2018-11-02 LAB — CEA (IN HOUSE-CHCC): CEA (CHCC-In House): 19.08 ng/mL — ABNORMAL HIGH (ref 0.00–5.00)

## 2018-11-02 MED ORDER — LORAZEPAM 1 MG PO TABS
1.0000 mg | ORAL_TABLET | Freq: Once | ORAL | Status: AC
  Administered 2018-11-02: 1 mg via ORAL

## 2018-11-02 MED ORDER — DEXAMETHASONE SODIUM PHOSPHATE 10 MG/ML IJ SOLN
10.0000 mg | Freq: Once | INTRAMUSCULAR | Status: AC
  Administered 2018-11-02: 10 mg via INTRAVENOUS

## 2018-11-02 MED ORDER — ATROPINE SULFATE 1 MG/ML IJ SOLN
0.5000 mg | Freq: Once | INTRAMUSCULAR | Status: AC | PRN
  Administered 2018-11-02: 13:00:00 0.5 mg via INTRAVENOUS

## 2018-11-02 MED ORDER — SODIUM CHLORIDE 0.9% FLUSH
10.0000 mL | Freq: Once | INTRAVENOUS | Status: AC
  Administered 2018-11-02: 10 mL
  Filled 2018-11-02: qty 10

## 2018-11-02 MED ORDER — SODIUM CHLORIDE 0.9% FLUSH
10.0000 mL | INTRAVENOUS | Status: DC | PRN
  Filled 2018-11-02: qty 10

## 2018-11-02 MED ORDER — SODIUM CHLORIDE 0.9 % IV SOLN
5.0000 mg/kg | Freq: Once | INTRAVENOUS | Status: AC
  Administered 2018-11-02: 12:00:00 350 mg via INTRAVENOUS
  Filled 2018-11-02: qty 14

## 2018-11-02 MED ORDER — HEPARIN SOD (PORK) LOCK FLUSH 100 UNIT/ML IV SOLN
500.0000 [IU] | Freq: Once | INTRAVENOUS | Status: DC | PRN
  Filled 2018-11-02: qty 5

## 2018-11-02 MED ORDER — SODIUM CHLORIDE 0.9 % IV SOLN
Freq: Once | INTRAVENOUS | Status: AC
  Administered 2018-11-02: 11:00:00 via INTRAVENOUS
  Filled 2018-11-02: qty 250

## 2018-11-02 MED ORDER — PALONOSETRON HCL INJECTION 0.25 MG/5ML
INTRAVENOUS | Status: AC
  Filled 2018-11-02: qty 5

## 2018-11-02 MED ORDER — ATROPINE SULFATE 1 MG/ML IJ SOLN
INTRAMUSCULAR | Status: AC
  Filled 2018-11-02: qty 1

## 2018-11-02 MED ORDER — DEXAMETHASONE SODIUM PHOSPHATE 10 MG/ML IJ SOLN
INTRAMUSCULAR | Status: AC
  Filled 2018-11-02: qty 1

## 2018-11-02 MED ORDER — PALONOSETRON HCL INJECTION 0.25 MG/5ML
0.2500 mg | Freq: Once | INTRAVENOUS | Status: AC
  Administered 2018-11-02: 12:00:00 0.25 mg via INTRAVENOUS

## 2018-11-02 MED ORDER — LORAZEPAM 1 MG PO TABS
ORAL_TABLET | ORAL | Status: AC
  Filled 2018-11-02: qty 1

## 2018-11-02 MED ORDER — IRINOTECAN HCL CHEMO INJECTION 100 MG/5ML
180.0000 mg/m2 | Freq: Once | INTRAVENOUS | Status: AC
  Administered 2018-11-02: 13:00:00 320 mg via INTRAVENOUS
  Filled 2018-11-02: qty 15

## 2018-11-02 MED ORDER — FLUOROURACIL CHEMO INJECTION 2.5 GM/50ML
400.0000 mg/m2 | Freq: Once | INTRAVENOUS | Status: AC
  Administered 2018-11-02: 15:00:00 700 mg via INTRAVENOUS
  Filled 2018-11-02: qty 14

## 2018-11-02 MED ORDER — SODIUM CHLORIDE 0.9 % IV SOLN
2400.0000 mg/m2 | INTRAVENOUS | Status: DC
  Administered 2018-11-02: 15:00:00 4300 mg via INTRAVENOUS
  Filled 2018-11-02: qty 86

## 2018-11-02 MED ORDER — LEUCOVORIN CALCIUM INJECTION 350 MG
400.0000 mg/m2 | Freq: Once | INTRAVENOUS | Status: AC
  Administered 2018-11-02: 13:00:00 716 mg via INTRAVENOUS
  Filled 2018-11-02: qty 35.8

## 2018-11-02 NOTE — Progress Notes (Signed)
Hematology and Oncology Follow Up Visit  Margaret Elliott [id number] 11/16/1950    11/02/18   Principle Diagnosis: 68 year old woman with colon cancer diagnosed in 2014 after presenting with stage IV and liver liver metastasis.  She developed peritoneal involvement in 2017 with the tumor being microsatellite stable,  NRAS mutated.    Prior Therapy:  She is status post laparoscopic laparotomy right hemicolectomy with ileocolonic anastomosis done on December 09, 2012.   FOLFOX and a Avastin chemotherapy started 01/18/2013. She is S/P 12 cycles completed in 05/2013.   She is a status post microwave ablation of metastatic lesion in the posterior segment of the right lobe of the liver completed on 09/28/2014 and repeated on 11/30/2014.  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  FOLFIRI and a Avastin salvage therapy started on 12/10/2015.  Therapy discontinued and maintained on 5-FU leucovorin with a Avastin only till March 2020.  Current therapy:     FOLFIRI and a Avastin restarted on 10/05/2018.  He is here for cycle 3 of therapy   Interim History:  Ms. Behringer is here for a follow-up visit.  Since last visit, he tolerated the last cycle of chemotherapy without any major complaints.  She denies any nausea, fatigue or infusion related complications.  She denies any fevers dose sweats.  Her performance status and activity level remains reasonable without any decline in ability to perform all of her daily functions.  She denied any alteration mental status, neuropathy, confusion or dizziness.  Denies any headaches or lethargy.  Denies any night sweats, weight loss or changes in appetite.  Denied orthopnea, dyspnea on exertion or chest discomfort.  Denies shortness of breath, difficulty breathing hemoptysis or cough.  Denies any abdominal distention, nausea, early satiety or dyspepsia.  Denies any hematuria, frequency,  dysuria or nocturia.  Denies any skin irritation, dryness or rash.  Denies any ecchymosis or petechiae.  Denies any lymphadenopathy or clotting.  Denies any heat or cold intolerance.  Denies any anxiety or depression.  Remaining review of system is negative.              Medications: I have reviewed the patient's current medications.  Current Outpatient Prescriptions  Medication Sig Dispense Refill  . acetaminophen (TYLENOL) 500 MG tablet Take 500 mg by mouth every 6 (six) hours as needed for pain.      . diphenhydrAMINE (BENADRYL) 25 mg capsule Take 25-50 mg by mouth daily as needed for itching. For allergic reaction      . lidocaine-prilocaine (EMLA) cream Apply 1 application topically as needed. Apply approx 1/2 tsp to skin over port, prior to chemotherapy treatments  1 kit  3  . Multiple Vitamin (MULTIVITAMIN) tablet Take 1 tablet by mouth daily.      . ondansetron (ZOFRAN) 8 MG tablet Take 1 tablet (8 mg total) by mouth every 8 (eight) hours as needed for nausea.  30 tablet  1   No current facility-administered medications for this visit.     Allergies: No Known Allergies  Past Medical History, Surgical history, Social history, and Family History reviewed today and remain unchanged.    Physical Exam:  Blood pressure (!) 140/91, pulse 87, temperature 98.3 F (36.8 C), temperature source Oral, resp. rate 18, height '5\' 4"'$  (1.626 m), weight 153 lb 12.8 oz (69.8 kg), SpO2 100 %.  ECOG: 0   General appearance: Alert, awake without any distress. Head: Atraumatic without abnormalities Oropharynx:  Without any thrush or ulcers. Eyes: No scleral icterus. Lymph nodes: No lymphadenopathy noted in the cervical, supraclavicular, or axillary nodes Heart:regular rate and rhythm, without any murmurs or gallops.   Lung: Clear to auscultation without any rhonchi, wheezes or dullness to percussion. Abdomin: Soft, nontender without any shifting dullness or ascites. Musculoskeletal: No  clubbing or cyanosis. Neurological: No motor or sensory deficits. Skin: No rashes or lesions.            CBC    Component Value Date/Time   WBC 2.5 (L) 11/02/2018 0953   WBC 4.2 09/08/2017 0834   RBC 3.69 (L) 11/02/2018 0953   HGB 11.8 (L) 11/02/2018 0953   HGB 12.3 05/26/2017 0809   HCT 36.9 11/02/2018 0953   HCT 37.6 05/26/2017 0809   PLT 215 11/02/2018 0953   PLT 180 05/26/2017 0809   MCV 100.0 11/02/2018 0953   MCV 100.3 05/26/2017 0809   MCH 32.0 11/02/2018 0953   MCHC 32.0 11/02/2018 0953   RDW 15.1 11/02/2018 0953   RDW 17.4 (H) 05/26/2017 0809   LYMPHSABS 1.2 11/02/2018 0953   LYMPHSABS 1.2 05/26/2017 0809   MONOABS 0.2 11/02/2018 0953   MONOABS 0.5 05/26/2017 0809   EOSABS 0.1 11/02/2018 0953   EOSABS 0.1 05/26/2017 0809   BASOSABS 0.0 11/02/2018 0953   BASOSABS 0.0 05/26/2017 0809     Results for COREN, CROWNOVER (MRN [id number]) as of 11/02/2018 10:00  Ref. Range 08/10/2018 08:35 10/05/2018 10:07 10/19/2018 08:29  CEA ([facility]) Latest Ref Range: 0.00 - 5.00 ng/mL 12.93 (H) 20.52 (H) 21.16 (H)     Impression and Plan:  68 year old woman with:   1.  Stage IV colon cancer diagnosed in 2014 with with disease involvement of the liver as well as peritoneal involvement.   She has tolerated FOLFIRI and Avastin without any major complications at this time.  Risks and benefits of continuing this therapy was reviewed and the plan is to complete 6 cycles of therapy and repeat imaging studies after that.  Potential complications associated with this treatment include nausea, fatigue, myelosuppression and diarrhea.  She is agreeable to continue at this time.    2.  IV access: Port-A-Cath has remained in use without any issues.   3. Neuropathy: Related to previous excel by exposure without any issues at this time.  No recent exacerbation.  4.  Abdominal discomfort: No recent exacerbation noted.  Abdominal pain is manageable.  5.  Anxiety: Her mood  remains stable at this time.  6.  Prognosis: Treatment remains palliative although aggressive therapy is warranted given her excellent performance status.  7.  Antiemetics: No nausea or vomiting reported at this time.  8.  Neutropenia: She will receive growth factor support after each cycle of therapy.  She is at risk of neutropenic sepsis and complication associated with Neulasta was reviewed she will receive it after each cycle.  9. Followup: We will be in 2 weeks for the next cycle of therapy.  25 minutes spent today face-to-face with the patient.  More than 50% of today's visit was spent on reviewing her disease status, treatment options as well as answering questions regarding future plan of care.   Zola Button MD 11/02/18

## 2018-11-02 NOTE — Patient Instructions (Signed)
[facility] Discharge Instructions for Patients Receiving Chemotherapy  Today you received the following chemotherapy agents Avastin, Irinotecan, Leukovorin, Fluorouracil  To help prevent nausea and vomiting after your treatment, we encourage you to take your nausea medication: As directed by your MD   If you develop nausea and vomiting that is not controlled by your nausea medication, call the clinic.   BELOW ARE SYMPTOMS THAT SHOULD BE REPORTED IMMEDIATELY:  *FEVER GREATER THAN 100.5 F  *CHILLS WITH OR WITHOUT FEVER  NAUSEA AND VOMITING THAT IS NOT CONTROLLED WITH YOUR NAUSEA MEDICATION  *UNUSUAL SHORTNESS OF BREATH  *UNUSUAL BRUISING OR BLEEDING  TENDERNESS IN MOUTH AND THROAT WITH OR WITHOUT PRESENCE OF ULCERS  *URINARY PROBLEMS  *BOWEL PROBLEMS  UNUSUAL RASH Items with * indicate a potential emergency and should be followed up as soon as possible.  Feel free to call the clinic should you have any questions or concerns. The clinic phone number is [phone].  Please show the [facility] at check-in to the Emergency Department and triage nurse.  Coronavirus (COVID-19) Are you at risk?  Are you at risk for the Coronavirus (COVID-19)?  To be considered HIGH RISK for Coronavirus (COVID-19), you have to meet the following criteria:  . Traveled to [place], [place], [place], [place] or [place]; or in the [place] to [place], [place], [place], or [place]; and have fever, cough, and shortness of breath within the last 2 weeks of travel OR . Been in close contact with a person diagnosed with COVID-19 within the last 2 weeks and have fever, cough, and shortness of breath . IF YOU DO NOT MEET THESE CRITERIA, YOU ARE CONSIDERED LOW RISK FOR COVID-19.  What to do if you are HIGH RISK for COVID-19?  Marland Kitchen If you are having a medical emergency, call 911. . Seek medical care right away. Before you go to a doctor's office, urgent care or emergency  department, call ahead and tell them about your recent travel, contact with someone diagnosed with COVID-19, and your symptoms. You should receive instructions from your physician's office regarding next steps of care.  . When you arrive at healthcare provider, tell the healthcare staff immediately you have returned from visiting [place], [place], [place], [place] or [place]; or traveled in the [place] to [place], [place], [place], or [place]; in the last two weeks or you have been in close contact with a person diagnosed with COVID-19 in the last 2 weeks.   . Tell the health care staff about your symptoms: fever, cough and shortness of breath. . After you have been seen by a medical provider, you will be either: o Tested for (COVID-19) and discharged home on quarantine except to seek medical care if symptoms worsen, and asked to  - Stay home and avoid contact with others until you get your results (4-5 days)  - Avoid travel on public transportation if possible (such as bus, train, or airplane) or o Sent to the Emergency Department by EMS for evaluation, COVID-19 testing, and possible admission depending on your condition and test results.  What to do if you are LOW RISK for COVID-19?  Reduce your risk of any infection by using the same precautions used for avoiding the common cold or flu:  Marland Kitchen Wash your hands often with soap and warm water for at least 20 seconds.  If soap and water are not readily available, use an alcohol-based hand sanitizer with at least 60% alcohol.  Marland Kitchen  If coughing or sneezing, cover your mouth and nose by coughing or sneezing into the elbow areas of your shirt or coat, into a tissue or into your sleeve (not your hands). . Avoid shaking hands with others and consider head nods or verbal greetings only. . Avoid touching your eyes, nose, or mouth with unwashed hands.  . Avoid close contact with people who are sick. . Avoid places or events with large numbers of people  in one location, like concerts or sporting events. . Carefully consider travel plans you have or are making. . If you are planning any travel outside or inside the [place], visit the CDC's Travelers' Health webpage for the latest health notices. . If you have some symptoms but not all symptoms, continue to monitor at home and seek medical attention if your symptoms worsen. . If you are having a medical emergency, call 911.   [facility] / e-Visit: [email or web address]         [facility]: [facility]: [phone]                   [facility]: [phone]

## 2018-11-03 ENCOUNTER — Telehealth: Payer: Self-pay | Admitting: Oncology

## 2018-11-03 NOTE — Telephone Encounter (Signed)
Scheduled appt per 5/13 sch message/ LOS - pt to get an updated schedule next visit.

## 2018-11-04 ENCOUNTER — Other Ambulatory Visit: Payer: Self-pay

## 2018-11-04 ENCOUNTER — Inpatient Hospital Stay: Payer: Medicare Other

## 2018-11-04 VITALS — BP 138/79 | HR 81 | Temp 98.0°F | Resp 18

## 2018-11-04 DIAGNOSIS — Z5111 Encounter for antineoplastic chemotherapy: Secondary | ICD-10-CM | POA: Diagnosis not present

## 2018-11-04 DIAGNOSIS — C189 Malignant neoplasm of colon, unspecified: Secondary | ICD-10-CM

## 2018-11-04 DIAGNOSIS — Z7689 Persons encountering health services in other specified circumstances: Secondary | ICD-10-CM | POA: Diagnosis not present

## 2018-11-04 DIAGNOSIS — K6389 Other specified diseases of intestine: Secondary | ICD-10-CM

## 2018-11-04 DIAGNOSIS — Z5112 Encounter for antineoplastic immunotherapy: Secondary | ICD-10-CM | POA: Diagnosis not present

## 2018-11-04 DIAGNOSIS — K769 Liver disease, unspecified: Secondary | ICD-10-CM

## 2018-11-04 DIAGNOSIS — C787 Secondary malignant neoplasm of liver and intrahepatic bile duct: Secondary | ICD-10-CM | POA: Diagnosis not present

## 2018-11-04 DIAGNOSIS — C786 Secondary malignant neoplasm of retroperitoneum and peritoneum: Secondary | ICD-10-CM | POA: Diagnosis not present

## 2018-11-04 MED ORDER — PEGFILGRASTIM INJECTION 6 MG/0.6ML ~~LOC~~
6.0000 mg | PREFILLED_SYRINGE | Freq: Once | SUBCUTANEOUS | Status: AC
  Administered 2018-11-04: 6 mg via SUBCUTANEOUS

## 2018-11-04 MED ORDER — SODIUM CHLORIDE 0.9% FLUSH
10.0000 mL | INTRAVENOUS | Status: DC | PRN
  Administered 2018-11-04: 10 mL
  Filled 2018-11-04: qty 10

## 2018-11-04 MED ORDER — HEPARIN SOD (PORK) LOCK FLUSH 100 UNIT/ML IV SOLN
500.0000 [IU] | Freq: Once | INTRAVENOUS | Status: AC | PRN
  Administered 2018-11-04: 500 [IU]
  Filled 2018-11-04: qty 5

## 2018-11-04 MED ORDER — PEGFILGRASTIM INJECTION 6 MG/0.6ML ~~LOC~~
PREFILLED_SYRINGE | SUBCUTANEOUS | Status: AC
  Filled 2018-11-04: qty 0.6

## 2018-11-16 ENCOUNTER — Inpatient Hospital Stay: Payer: Medicare Other

## 2018-11-16 ENCOUNTER — Other Ambulatory Visit: Payer: Self-pay

## 2018-11-16 ENCOUNTER — Inpatient Hospital Stay (HOSPITAL_BASED_OUTPATIENT_CLINIC_OR_DEPARTMENT_OTHER): Payer: Medicare Other | Admitting: Oncology

## 2018-11-16 VITALS — BP 136/90 | HR 85 | Temp 98.3°F | Resp 18 | Ht 64.0 in | Wt 155.4 lb

## 2018-11-16 VITALS — BP 124/82

## 2018-11-16 DIAGNOSIS — F419 Anxiety disorder, unspecified: Secondary | ICD-10-CM | POA: Diagnosis not present

## 2018-11-16 DIAGNOSIS — C189 Malignant neoplasm of colon, unspecified: Secondary | ICD-10-CM

## 2018-11-16 DIAGNOSIS — C787 Secondary malignant neoplasm of liver and intrahepatic bile duct: Secondary | ICD-10-CM

## 2018-11-16 DIAGNOSIS — Z79899 Other long term (current) drug therapy: Secondary | ICD-10-CM | POA: Diagnosis not present

## 2018-11-16 DIAGNOSIS — R110 Nausea: Secondary | ICD-10-CM

## 2018-11-16 DIAGNOSIS — Z5112 Encounter for antineoplastic immunotherapy: Secondary | ICD-10-CM | POA: Diagnosis not present

## 2018-11-16 DIAGNOSIS — Z95828 Presence of other vascular implants and grafts: Secondary | ICD-10-CM | POA: Diagnosis not present

## 2018-11-16 DIAGNOSIS — K6389 Other specified diseases of intestine: Secondary | ICD-10-CM

## 2018-11-16 DIAGNOSIS — K769 Liver disease, unspecified: Secondary | ICD-10-CM

## 2018-11-16 DIAGNOSIS — C786 Secondary malignant neoplasm of retroperitoneum and peritoneum: Secondary | ICD-10-CM

## 2018-11-16 DIAGNOSIS — Z7689 Persons encountering health services in other specified circumstances: Secondary | ICD-10-CM | POA: Diagnosis not present

## 2018-11-16 DIAGNOSIS — Z5111 Encounter for antineoplastic chemotherapy: Secondary | ICD-10-CM | POA: Diagnosis not present

## 2018-11-16 LAB — CBC WITH DIFFERENTIAL (CANCER CENTER ONLY)
Abs Immature Granulocytes: 1.16 10*3/uL — ABNORMAL HIGH (ref 0.00–0.07)
Basophils Absolute: 0.1 10*3/uL (ref 0.0–0.1)
Basophils Relative: 1 %
Eosinophils Absolute: 0 10*3/uL (ref 0.0–0.5)
Eosinophils Relative: 0 %
HCT: 36 % (ref 36.0–46.0)
Hemoglobin: 11.7 g/dL — ABNORMAL LOW (ref 12.0–15.0)
Immature Granulocytes: 8 %
Lymphocytes Relative: 15 %
Lymphs Abs: 2.2 10*3/uL (ref 0.7–4.0)
MCH: 32.2 pg (ref 26.0–34.0)
MCHC: 32.5 g/dL (ref 30.0–36.0)
MCV: 99.2 fL (ref 80.0–100.0)
Monocytes Absolute: 1 10*3/uL (ref 0.1–1.0)
Monocytes Relative: 7 %
Neutro Abs: 10 10*3/uL — ABNORMAL HIGH (ref 1.7–7.7)
Neutrophils Relative %: 69 %
Platelet Count: 137 10*3/uL — ABNORMAL LOW (ref 150–400)
RBC: 3.63 MIL/uL — ABNORMAL LOW (ref 3.87–5.11)
RDW: 16.1 % — ABNORMAL HIGH (ref 11.5–15.5)
WBC Count: 14.6 10*3/uL — ABNORMAL HIGH (ref 4.0–10.5)
nRBC: 0.6 % — ABNORMAL HIGH (ref 0.0–0.2)

## 2018-11-16 LAB — CMP (CANCER CENTER ONLY)
ALT: 15 U/L (ref 0–44)
AST: 21 U/L (ref 15–41)
Albumin: 3.4 g/dL — ABNORMAL LOW (ref 3.5–5.0)
Alkaline Phosphatase: 108 U/L (ref 38–126)
Anion gap: 7 (ref 5–15)
BUN: 15 mg/dL (ref 8–23)
CO2: 26 mmol/L (ref 22–32)
Calcium: 9 mg/dL (ref 8.9–10.3)
Chloride: 108 mmol/L (ref 98–111)
Creatinine: 1.14 mg/dL — ABNORMAL HIGH (ref 0.44–1.00)
GFR, Est AFR Am: 57 mL/min — ABNORMAL LOW (ref >60)
GFR, Estimated: 49 mL/min — ABNORMAL LOW (ref >60)
Glucose, Bld: 96 mg/dL (ref 70–99)
Potassium: 3.9 mmol/L (ref 3.5–5.1)
Sodium: 141 mmol/L (ref 135–145)
Total Bilirubin: 0.4 mg/dL (ref 0.3–1.2)
Total Protein: 7.2 g/dL (ref 6.5–8.1)

## 2018-11-16 LAB — CEA (IN HOUSE-CHCC): CEA (CHCC-In House): 12.37 ng/mL — ABNORMAL HIGH (ref 0.00–5.00)

## 2018-11-16 MED ORDER — LORAZEPAM 1 MG PO TABS
ORAL_TABLET | ORAL | Status: AC
  Filled 2018-11-16: qty 1

## 2018-11-16 MED ORDER — PALONOSETRON HCL INJECTION 0.25 MG/5ML
0.2500 mg | Freq: Once | INTRAVENOUS | Status: AC
  Administered 2018-11-16: 10:00:00 0.25 mg via INTRAVENOUS

## 2018-11-16 MED ORDER — SODIUM CHLORIDE 0.9 % IV SOLN
2400.0000 mg/m2 | INTRAVENOUS | Status: DC
  Administered 2018-11-16: 14:00:00 4300 mg via INTRAVENOUS
  Filled 2018-11-16: qty 86

## 2018-11-16 MED ORDER — IRINOTECAN HCL CHEMO INJECTION 100 MG/5ML
180.0000 mg/m2 | Freq: Once | INTRAVENOUS | Status: AC
  Administered 2018-11-16: 11:00:00 320 mg via INTRAVENOUS
  Filled 2018-11-16: qty 15

## 2018-11-16 MED ORDER — ATROPINE SULFATE 1 MG/ML IJ SOLN: 0.4000 mg | Freq: Once | INTRAMUSCULAR | Status: DC | PRN

## 2018-11-16 MED ORDER — ATROPINE SULFATE 0.4 MG/ML IV SOSY: 0.4000 mg | PREFILLED_SYRINGE | Freq: Once | INTRAVENOUS | Status: DC

## 2018-11-16 MED ORDER — PALONOSETRON HCL INJECTION 0.25 MG/5ML
INTRAVENOUS | Status: AC
  Filled 2018-11-16: qty 5

## 2018-11-16 MED ORDER — HEPARIN SOD (PORK) LOCK FLUSH 100 UNIT/ML IV SOLN
500.0000 [IU] | Freq: Once | INTRAVENOUS | Status: DC | PRN
  Filled 2018-11-16: qty 5

## 2018-11-16 MED ORDER — DEXAMETHASONE SODIUM PHOSPHATE 10 MG/ML IJ SOLN
10.0000 mg | Freq: Once | INTRAMUSCULAR | Status: AC
  Administered 2018-11-16: 10:00:00 10 mg via INTRAVENOUS

## 2018-11-16 MED ORDER — ATROPINE SULFATE 1 MG/ML IJ SOLN: 0.5000 mg | Freq: Once | INTRAMUSCULAR | Status: DC | PRN

## 2018-11-16 MED ORDER — SODIUM CHLORIDE 0.9% FLUSH
10.0000 mL | Freq: Once | INTRAVENOUS | Status: AC
  Administered 2018-11-16: 10 mL
  Filled 2018-11-16: qty 10

## 2018-11-16 MED ORDER — ATROPINE SULFATE 0.4 MG/ML IJ SOLN
0.4000 mg | Freq: Once | INTRAMUSCULAR | Status: AC
  Administered 2018-11-16: 11:00:00 0.4 mg via INTRAVENOUS

## 2018-11-16 MED ORDER — ATROPINE SULFATE 0.4 MG/ML IJ SOLN
INTRAMUSCULAR | Status: AC
  Filled 2018-11-16: qty 1

## 2018-11-16 MED ORDER — LORAZEPAM 1 MG PO TABS
1.0000 mg | ORAL_TABLET | Freq: Once | ORAL | Status: AC
  Administered 2018-11-16: 10:00:00 1 mg via ORAL

## 2018-11-16 MED ORDER — SODIUM CHLORIDE 0.9 % IV SOLN
Freq: Once | INTRAVENOUS | Status: AC
  Administered 2018-11-16: 11:00:00 via INTRAVENOUS
  Filled 2018-11-16: qty 250

## 2018-11-16 MED ORDER — LEUCOVORIN CALCIUM INJECTION 350 MG
400.0000 mg/m2 | Freq: Once | INTRAVENOUS | Status: AC
  Administered 2018-11-16: 11:00:00 716 mg via INTRAVENOUS
  Filled 2018-11-16: qty 35.8

## 2018-11-16 MED ORDER — SODIUM CHLORIDE 0.9 % IV SOLN
Freq: Once | INTRAVENOUS | Status: AC
  Administered 2018-11-16: 10:00:00 via INTRAVENOUS
  Filled 2018-11-16: qty 250

## 2018-11-16 MED ORDER — SODIUM CHLORIDE 0.9% FLUSH
10.0000 mL | INTRAVENOUS | Status: DC | PRN
  Filled 2018-11-16: qty 10

## 2018-11-16 MED ORDER — FLUOROURACIL CHEMO INJECTION 2.5 GM/50ML
400.0000 mg/m2 | Freq: Once | INTRAVENOUS | Status: AC
  Administered 2018-11-16: 13:00:00 700 mg via INTRAVENOUS
  Filled 2018-11-16: qty 14

## 2018-11-16 MED ORDER — SODIUM CHLORIDE 0.9 % IV SOLN
5.0000 mg/kg | Freq: Once | INTRAVENOUS | Status: AC
  Administered 2018-11-16: 11:00:00 350 mg via INTRAVENOUS
  Filled 2018-11-16: qty 14

## 2018-11-16 MED ORDER — ATROPINE SULFATE 1 MG/ML IJ SOLN: 0.4000 mg | Freq: Once | INTRAMUSCULAR | Status: DC

## 2018-11-16 MED ORDER — DEXAMETHASONE SODIUM PHOSPHATE 10 MG/ML IJ SOLN
INTRAMUSCULAR | Status: AC
  Filled 2018-11-16: qty 1

## 2018-11-16 NOTE — Addendum Note (Signed)
Addended by: Wyatt Portela on: 11/16/2018 08:58 AM   Modules accepted: Orders

## 2018-11-16 NOTE — Patient Instructions (Signed)
[facility] Discharge Instructions for Patients Receiving Chemotherapy  Today you received the following chemotherapy agents Avastin, Irinotecan, Leukovorin, Fluorouracil  To help prevent nausea and vomiting after your treatment, we encourage you to take your nausea medication: As directed by your MD   If you develop nausea and vomiting that is not controlled by your nausea medication, call the clinic.   BELOW ARE SYMPTOMS THAT SHOULD BE REPORTED IMMEDIATELY:  *FEVER GREATER THAN 100.5 F  *CHILLS WITH OR WITHOUT FEVER  NAUSEA AND VOMITING THAT IS NOT CONTROLLED WITH YOUR NAUSEA MEDICATION  *UNUSUAL SHORTNESS OF BREATH  *UNUSUAL BRUISING OR BLEEDING  TENDERNESS IN MOUTH AND THROAT WITH OR WITHOUT PRESENCE OF ULCERS  *URINARY PROBLEMS  *BOWEL PROBLEMS  UNUSUAL RASH Items with * indicate a potential emergency and should be followed up as soon as possible.  Feel free to call the clinic should you have any questions or concerns. The clinic phone number is [phone].  Please show the [facility] at check-in to the Emergency Department and triage nurse.  Coronavirus (COVID-19) Are you at risk?  Are you at risk for the Coronavirus (COVID-19)?  To be considered HIGH RISK for Coronavirus (COVID-19), you have to meet the following criteria:  . Traveled to [place], [place], [place], [place] or [place]; or in the [place] to [place], [place], [place], or [place]; and have fever, cough, and shortness of breath within the last 2 weeks of travel OR . Been in close contact with a person diagnosed with COVID-19 within the last 2 weeks and have fever, cough, and shortness of breath . IF YOU DO NOT MEET THESE CRITERIA, YOU ARE CONSIDERED LOW RISK FOR COVID-19.  What to do if you are HIGH RISK for COVID-19?  Marland Kitchen If you are having a medical emergency, call 911. . Seek medical care right away. Before you go to a doctor's office, urgent care or emergency  department, call ahead and tell them about your recent travel, contact with someone diagnosed with COVID-19, and your symptoms. You should receive instructions from your physician's office regarding next steps of care.  . When you arrive at healthcare provider, tell the healthcare staff immediately you have returned from visiting [place], [place], [place], [place] or [place]; or traveled in the [place] to [place], [place], [place], or [place]; in the last two weeks or you have been in close contact with a person diagnosed with COVID-19 in the last 2 weeks.   . Tell the health care staff about your symptoms: fever, cough and shortness of breath. . After you have been seen by a medical provider, you will be either: o Tested for (COVID-19) and discharged home on quarantine except to seek medical care if symptoms worsen, and asked to  - Stay home and avoid contact with others until you get your results (4-5 days)  - Avoid travel on public transportation if possible (such as bus, train, or airplane) or o Sent to the Emergency Department by EMS for evaluation, COVID-19 testing, and possible admission depending on your condition and test results.  What to do if you are LOW RISK for COVID-19?  Reduce your risk of any infection by using the same precautions used for avoiding the common cold or flu:  Marland Kitchen Wash your hands often with soap and warm water for at least 20 seconds.  If soap and water are not readily available, use an alcohol-based hand sanitizer with at least 60% alcohol.  Marland Kitchen  If coughing or sneezing, cover your mouth and nose by coughing or sneezing into the elbow areas of your shirt or coat, into a tissue or into your sleeve (not your hands). . Avoid shaking hands with others and consider head nods or verbal greetings only. . Avoid touching your eyes, nose, or mouth with unwashed hands.  . Avoid close contact with people who are sick. . Avoid places or events with large numbers of people  in one location, like concerts or sporting events. . Carefully consider travel plans you have or are making. . If you are planning any travel outside or inside the [place], visit the CDC's Travelers' Health webpage for the latest health notices. . If you have some symptoms but not all symptoms, continue to monitor at home and seek medical attention if your symptoms worsen. . If you are having a medical emergency, call 911.   [facility] / e-Visit: [email or web address]         [facility]: [facility]: [phone]                   [facility]: [phone]

## 2018-11-16 NOTE — Progress Notes (Signed)
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    Prior Therapy:  She is status post laparoscopic laparotomy right hemicolectomy with ileocolonic anastomosis done on December 09, 2012.   FOLFOX and a Avastin chemotherapy started 01/18/2013. She is S/P 12 cycles completed in 05/2013.   She is a status post microwave ablation of metastatic lesion in the posterior segment of the right lobe of the liver completed on 09/28/2014 and repeated on 11/30/2014.  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  FOLFIRI and a Avastin salvage therapy started on 12/10/2015.  Therapy discontinued and maintained on 5-FU leucovorin with a Avastin only till March 2020.  Current therapy:     FOLFIRI and a Avastin restarted on 10/05/2018.  He is here for cycle 5 of therapy.    Interim History:  Margaret Elliott returns today for a repeat evaluation.  Since the last visit, she tolerated the last cycle of chemotherapy without any major complications.  She does report some mild nausea no vomiting or excessive fatigue.  She denies any worsening neuropathy or diarrhea.  Her abdominal discomfort is manageable without any worsening distention or changes in her bowel habits.   She denied headaches, blurry vision, syncope or seizures.  Denies any fevers, chills or sweats.  Denied chest pain, palpitation, orthopnea or leg edema.  Denied cough, wheezing or hemoptysis.  Denied nausea, vomiting or abdominal pain.  Denies any constipation or diarrhea.  Denies any frequency urgency or hesitancy.  Denies any arthralgias or myalgias.  Denies any skin rashes or lesions.  Denies any bleeding or clotting tendency.  Denies any easy  bruising.  Denies any hair or nail changes.  Denies any anxiety or depression.  Remaining review of system is negative.               Medications: I have reviewed the patient's current medications.  Current Outpatient Prescriptions  Medication Sig Dispense Refill  . acetaminophen (TYLENOL) 500 MG tablet Take 500 mg by mouth every 6 (six) hours as needed for pain.      . diphenhydrAMINE (BENADRYL) 25 mg capsule Take 25-50 mg by mouth daily as needed for itching. For allergic reaction      . lidocaine-prilocaine (EMLA) cream Apply 1 application topically as needed. Apply approx 1/2 tsp to skin over port, prior to chemotherapy treatments  1 kit  3  . Multiple Vitamin (MULTIVITAMIN) tablet Take 1 tablet by mouth daily.      . ondansetron (ZOFRAN) 8 MG tablet Take 1 tablet (8 mg total) by mouth every 8 (eight) hours as needed for nausea.  30 tablet  1   No current facility-administered medications for this visit.     Allergies: No Known Allergies  Past Medical History, Surgical history, Social history, and Family History reviewed today and remain unchanged.    Physical Exam: Blood pressure 136/90, pulse 85, temperature 98.3 F (36.8 C), temperature source Oral, resp. rate 18, height 5' 4" (1.626 m), weight 155 lb 6.4 oz (70.5 kg), SpO2 100 %.    ECOG: 0   General appearance: Comfortable appearing without any discomfort Head: Normocephalic without any trauma Oropharynx: Mucous membranes are moist and pink without any thrush or ulcers.   Eyes: Pupils are equal and round reactive to light. Lymph nodes: No cervical, supraclavicular, inguinal or axillary lymphadenopathy.   Heart:regular rate and rhythm.  S1 and S2 without leg edema. Lung: Clear without any rhonchi or wheezes.  No dullness to percussion. Abdomin: Soft, nontender, nondistended with good bowel sounds.  No hepatosplenomegaly. Musculoskeletal: No joint deformity or effusion.  Full range of motion  noted. Neurological: No deficits noted on motor, sensory and deep tendon reflex exam. Skin: No petechial rash or dryness.  Appeared moist.  .            CBC    Component Value Date/Time   WBC 2.5 (L) 11/02/2018 0953   WBC 4.2 09/08/2017 0834   RBC 3.69 (L) 11/02/2018 0953   HGB 11.8 (L) 11/02/2018 0953   HGB 12.3 05/26/2017 0809   HCT 36.9 11/02/2018 0953   HCT 37.6 05/26/2017 0809   PLT 215 11/02/2018 0953   PLT 180 05/26/2017 0809   MCV 100.0 11/02/2018 0953   MCV 100.3 05/26/2017 0809   MCH 32.0 11/02/2018 0953   MCHC 32.0 11/02/2018 0953   RDW 15.1 11/02/2018 0953   RDW 17.4 (H) 05/26/2017 0809   LYMPHSABS 1.2 11/02/2018 0953   LYMPHSABS 1.2 05/26/2017 0809   MONOABS 0.2 11/02/2018 0953   MONOABS 0.5 05/26/2017 0809   EOSABS 0.1 11/02/2018 0953   EOSABS 0.1 05/26/2017 0809   BASOSABS 0.0 11/02/2018 0953   BASOSABS 0.0 05/26/2017 0809    Results for Margaret Elliott, Margaret Elliott (MRN [id number]) as of 11/16/2018 08:40  Ref. Range 10/05/2018 10:07 10/19/2018 08:29 11/02/2018 09:53  CEA ([facility]) Latest Ref Range: 0.00 - 5.00 ng/mL 20.52 (H) 21.16 (H) 19.08 (H)       Impression and Plan:  68 year old woman with:   1.  Colon cancer diagnosed in 2014.  She was found to have stage IV with hepatic involvement and subsequently peritoneal metastasis.   She is currently receiving chemotherapy with FOLFIRI and Avastin without any major complications.  Her CEA showed some stability and some mild decline and overall clinical status is stable.  Risks and benefits of continuing this therapy was reviewed today with the plan to repeat imaging studies after cycle 6.  Different salvage therapies were also reviewed case of progression of disease after CT scan.  He is agreeable to proceed with this plan.    2.  IV access: Port-A-Cath continues to be accessed without any issues.   3. 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  No exacerbation noted.  4.  Abdominal  discomfort: Related to malignancy.  Her abdominal pain has been stable without any further increase.  5.  Anxiety: No major changes in her mood.  Overall stable with reasonable sleep.  6.  Prognosis: Her disease is incurable although aggressive therapy is warranted given her excellent performance status.  7.  Antiemetics: No nausea or vomiting reported since the last time.  Antiemetics are available to her.  8.  Neutropenia: Related to chemotherapy with high risk of developing neutropenic sepsis.  She will receive growth factor support after each cycle.  9. Followup: In 2 weeks for subsequent cycle of therapy.  25 minutes spent today face-to-face with the patient.  More than 50% of today's visit was dedicated to updating the natural course of her disease, alternative treatment options and dealing with complications related to current therapy.   Zola Button MD 11/16/18

## 2018-11-18 ENCOUNTER — Other Ambulatory Visit: Payer: Self-pay

## 2018-11-18 ENCOUNTER — Inpatient Hospital Stay: Payer: Medicare Other

## 2018-11-18 VITALS — BP 128/78 | HR 82 | Temp 98.3°F | Resp 17

## 2018-11-18 DIAGNOSIS — C786 Secondary malignant neoplasm of retroperitoneum and peritoneum: Secondary | ICD-10-CM | POA: Diagnosis not present

## 2018-11-18 DIAGNOSIS — K6389 Other specified diseases of intestine: Secondary | ICD-10-CM

## 2018-11-18 DIAGNOSIS — Z7689 Persons encountering health services in other specified circumstances: Secondary | ICD-10-CM | POA: Diagnosis not present

## 2018-11-18 DIAGNOSIS — Z5112 Encounter for antineoplastic immunotherapy: Secondary | ICD-10-CM | POA: Diagnosis not present

## 2018-11-18 DIAGNOSIS — Z5111 Encounter for antineoplastic chemotherapy: Secondary | ICD-10-CM | POA: Diagnosis not present

## 2018-11-18 DIAGNOSIS — C787 Secondary malignant neoplasm of liver and intrahepatic bile duct: Secondary | ICD-10-CM | POA: Diagnosis not present

## 2018-11-18 DIAGNOSIS — K769 Liver disease, unspecified: Secondary | ICD-10-CM

## 2018-11-18 DIAGNOSIS — C189 Malignant neoplasm of colon, unspecified: Secondary | ICD-10-CM | POA: Diagnosis not present

## 2018-11-18 MED ORDER — PEGFILGRASTIM INJECTION 6 MG/0.6ML ~~LOC~~
PREFILLED_SYRINGE | SUBCUTANEOUS | Status: AC
  Filled 2018-11-18: qty 0.6

## 2018-11-18 MED ORDER — PEGFILGRASTIM INJECTION 6 MG/0.6ML ~~LOC~~
6.0000 mg | PREFILLED_SYRINGE | Freq: Once | SUBCUTANEOUS | Status: AC
  Administered 2018-11-18: 6 mg via SUBCUTANEOUS

## 2018-11-18 MED ORDER — HEPARIN SOD (PORK) LOCK FLUSH 100 UNIT/ML IV SOLN
500.0000 [IU] | Freq: Once | INTRAVENOUS | Status: AC | PRN
  Administered 2018-11-18: 500 [IU]
  Filled 2018-11-18: qty 5

## 2018-11-18 MED ORDER — SODIUM CHLORIDE 0.9% FLUSH
10.0000 mL | INTRAVENOUS | Status: DC | PRN
  Administered 2018-11-18: 12:00:00 10 mL
  Filled 2018-11-18: qty 10

## 2018-11-30 ENCOUNTER — Inpatient Hospital Stay: Payer: Medicare Other

## 2018-11-30 ENCOUNTER — Inpatient Hospital Stay: Payer: Medicare Other | Attending: Oncology

## 2018-11-30 ENCOUNTER — Other Ambulatory Visit: Payer: Self-pay

## 2018-11-30 ENCOUNTER — Inpatient Hospital Stay (HOSPITAL_BASED_OUTPATIENT_CLINIC_OR_DEPARTMENT_OTHER): Payer: Medicare Other | Admitting: Oncology

## 2018-11-30 VITALS — BP 130/86 | HR 87 | Temp 98.7°F | Resp 18 | Ht 64.0 in | Wt 154.3 lb

## 2018-11-30 VITALS — BP 130/88

## 2018-11-30 DIAGNOSIS — R197 Diarrhea, unspecified: Secondary | ICD-10-CM | POA: Diagnosis not present

## 2018-11-30 DIAGNOSIS — C189 Malignant neoplasm of colon, unspecified: Secondary | ICD-10-CM | POA: Diagnosis not present

## 2018-11-30 DIAGNOSIS — Z95828 Presence of other vascular implants and grafts: Secondary | ICD-10-CM | POA: Diagnosis not present

## 2018-11-30 DIAGNOSIS — Z5112 Encounter for antineoplastic immunotherapy: Secondary | ICD-10-CM | POA: Diagnosis not present

## 2018-11-30 DIAGNOSIS — J439 Emphysema, unspecified: Secondary | ICD-10-CM | POA: Insufficient documentation

## 2018-11-30 DIAGNOSIS — C787 Secondary malignant neoplasm of liver and intrahepatic bile duct: Secondary | ICD-10-CM

## 2018-11-30 DIAGNOSIS — G629 Polyneuropathy, unspecified: Secondary | ICD-10-CM

## 2018-11-30 DIAGNOSIS — R109 Unspecified abdominal pain: Secondary | ICD-10-CM | POA: Diagnosis not present

## 2018-11-30 DIAGNOSIS — K769 Liver disease, unspecified: Secondary | ICD-10-CM

## 2018-11-30 DIAGNOSIS — C786 Secondary malignant neoplasm of retroperitoneum and peritoneum: Secondary | ICD-10-CM | POA: Insufficient documentation

## 2018-11-30 DIAGNOSIS — I700 Atherosclerosis of aorta: Secondary | ICD-10-CM | POA: Insufficient documentation

## 2018-11-30 DIAGNOSIS — Z79899 Other long term (current) drug therapy: Secondary | ICD-10-CM | POA: Insufficient documentation

## 2018-11-30 DIAGNOSIS — F419 Anxiety disorder, unspecified: Secondary | ICD-10-CM | POA: Diagnosis not present

## 2018-11-30 DIAGNOSIS — C187 Malignant neoplasm of sigmoid colon: Secondary | ICD-10-CM | POA: Diagnosis not present

## 2018-11-30 DIAGNOSIS — K6389 Other specified diseases of intestine: Secondary | ICD-10-CM

## 2018-11-30 DIAGNOSIS — Z5111 Encounter for antineoplastic chemotherapy: Secondary | ICD-10-CM | POA: Diagnosis not present

## 2018-11-30 DIAGNOSIS — Z7689 Persons encountering health services in other specified circumstances: Secondary | ICD-10-CM | POA: Insufficient documentation

## 2018-11-30 LAB — CBC WITH DIFFERENTIAL (CANCER CENTER ONLY)
Abs Immature Granulocytes: 0.82 10*3/uL — ABNORMAL HIGH (ref 0.00–0.07)
Basophils Absolute: 0.1 10*3/uL (ref 0.0–0.1)
Basophils Relative: 0 %
Eosinophils Absolute: 0.1 10*3/uL (ref 0.0–0.5)
Eosinophils Relative: 1 %
HCT: 36.1 % (ref 36.0–46.0)
Hemoglobin: 11.7 g/dL — ABNORMAL LOW (ref 12.0–15.0)
Immature Granulocytes: 6 %
Lymphocytes Relative: 15 %
Lymphs Abs: 2.1 10*3/uL (ref 0.7–4.0)
MCH: 32.8 pg (ref 26.0–34.0)
MCHC: 32.4 g/dL (ref 30.0–36.0)
MCV: 101.1 fL — ABNORMAL HIGH (ref 80.0–100.0)
Monocytes Absolute: 0.9 10*3/uL (ref 0.1–1.0)
Monocytes Relative: 7 %
Neutro Abs: 10.1 10*3/uL — ABNORMAL HIGH (ref 1.7–7.7)
Neutrophils Relative %: 71 %
Platelet Count: 264 10*3/uL (ref 150–400)
RBC: 3.57 MIL/uL — ABNORMAL LOW (ref 3.87–5.11)
RDW: 17.1 % — ABNORMAL HIGH (ref 11.5–15.5)
WBC Count: 14 10*3/uL — ABNORMAL HIGH (ref 4.0–10.5)
nRBC: 0.3 % — ABNORMAL HIGH (ref 0.0–0.2)

## 2018-11-30 LAB — CMP (CANCER CENTER ONLY)
ALT: 10 U/L (ref 0–44)
AST: 15 U/L (ref 15–41)
Albumin: 3.5 g/dL (ref 3.5–5.0)
Alkaline Phosphatase: 124 U/L (ref 38–126)
Anion gap: 9 (ref 5–15)
BUN: 13 mg/dL (ref 8–23)
CO2: 25 mmol/L (ref 22–32)
Calcium: 8.9 mg/dL (ref 8.9–10.3)
Chloride: 107 mmol/L (ref 98–111)
Creatinine: 1.12 mg/dL — ABNORMAL HIGH (ref 0.44–1.00)
GFR, Est AFR Am: 58 mL/min — ABNORMAL LOW (ref >60)
GFR, Estimated: 50 mL/min — ABNORMAL LOW (ref >60)
Glucose, Bld: 104 mg/dL — ABNORMAL HIGH (ref 70–99)
Potassium: 4.1 mmol/L (ref 3.5–5.1)
Sodium: 141 mmol/L (ref 135–145)
Total Bilirubin: 0.5 mg/dL (ref 0.3–1.2)
Total Protein: 7.3 g/dL (ref 6.5–8.1)

## 2018-11-30 LAB — TOTAL PROTEIN, URINE DIPSTICK: Protein, ur: NEGATIVE mg/dL

## 2018-11-30 LAB — CEA (IN HOUSE-CHCC): CEA (CHCC-In House): 35.6 ng/mL — ABNORMAL HIGH (ref 0.00–5.00)

## 2018-11-30 MED ORDER — PALONOSETRON HCL INJECTION 0.25 MG/5ML
0.2500 mg | Freq: Once | INTRAVENOUS | Status: AC
  Administered 2018-11-30: 11:00:00 0.25 mg via INTRAVENOUS

## 2018-11-30 MED ORDER — ATROPINE SULFATE 0.4 MG/ML IJ SOLN
INTRAMUSCULAR | Status: AC
  Filled 2018-11-30: qty 1

## 2018-11-30 MED ORDER — SODIUM CHLORIDE 0.9 % IV SOLN
2400.0000 mg/m2 | INTRAVENOUS | Status: DC
  Administered 2018-11-30: 4300 mg via INTRAVENOUS
  Filled 2018-11-30: qty 86

## 2018-11-30 MED ORDER — DEXAMETHASONE SODIUM PHOSPHATE 10 MG/ML IJ SOLN
10.0000 mg | Freq: Once | INTRAMUSCULAR | Status: AC
  Administered 2018-11-30: 10 mg via INTRAVENOUS

## 2018-11-30 MED ORDER — SODIUM CHLORIDE 0.9 % IV SOLN
5.0000 mg/kg | Freq: Once | INTRAVENOUS | Status: AC
  Administered 2018-11-30: 350 mg via INTRAVENOUS
  Filled 2018-11-30: qty 14

## 2018-11-30 MED ORDER — SODIUM CHLORIDE 0.9% FLUSH
10.0000 mL | INTRAVENOUS | Status: DC | PRN
  Filled 2018-11-30: qty 10

## 2018-11-30 MED ORDER — FLUOROURACIL CHEMO INJECTION 2.5 GM/50ML
400.0000 mg/m2 | Freq: Once | INTRAVENOUS | Status: AC
  Administered 2018-11-30: 700 mg via INTRAVENOUS
  Filled 2018-11-30: qty 14

## 2018-11-30 MED ORDER — IRINOTECAN HCL CHEMO INJECTION 100 MG/5ML
180.0000 mg/m2 | Freq: Once | INTRAVENOUS | Status: AC
  Administered 2018-11-30: 320 mg via INTRAVENOUS
  Filled 2018-11-30: qty 15

## 2018-11-30 MED ORDER — HEPARIN SOD (PORK) LOCK FLUSH 100 UNIT/ML IV SOLN
500.0000 [IU] | Freq: Once | INTRAVENOUS | Status: DC | PRN
  Filled 2018-11-30: qty 5

## 2018-11-30 MED ORDER — PALONOSETRON HCL INJECTION 0.25 MG/5ML
INTRAVENOUS | Status: AC
  Filled 2018-11-30: qty 5

## 2018-11-30 MED ORDER — DEXAMETHASONE SODIUM PHOSPHATE 10 MG/ML IJ SOLN
INTRAMUSCULAR | Status: AC
  Filled 2018-11-30: qty 1

## 2018-11-30 MED ORDER — LORAZEPAM 1 MG PO TABS
ORAL_TABLET | ORAL | Status: AC
  Filled 2018-11-30: qty 1

## 2018-11-30 MED ORDER — SODIUM CHLORIDE 0.9 % IV SOLN
Freq: Once | INTRAVENOUS | Status: AC
  Administered 2018-11-30: 10:00:00 via INTRAVENOUS
  Filled 2018-11-30: qty 250

## 2018-11-30 MED ORDER — ATROPINE SULFATE 1 MG/ML IJ SOLN: 0.5000 mg | Freq: Once | INTRAMUSCULAR | Status: DC | PRN

## 2018-11-30 MED ORDER — LEUCOVORIN CALCIUM INJECTION 350 MG
400.0000 mg/m2 | Freq: Once | INTRAVENOUS | Status: AC
  Administered 2018-11-30: 12:00:00 716 mg via INTRAVENOUS
  Filled 2018-11-30: qty 35.8

## 2018-11-30 MED ORDER — LORAZEPAM 1 MG PO TABS
1.0000 mg | ORAL_TABLET | Freq: Once | ORAL | Status: AC
  Administered 2018-11-30: 1 mg via ORAL

## 2018-11-30 MED ORDER — SODIUM CHLORIDE 0.9 % IV SOLN
Freq: Once | INTRAVENOUS | Status: AC
  Administered 2018-11-30: 11:00:00 via INTRAVENOUS
  Filled 2018-11-30: qty 250

## 2018-11-30 MED ORDER — ATROPINE SULFATE 0.4 MG/ML IJ SOLN
0.4000 mg | Freq: Once | INTRAMUSCULAR | Status: AC | PRN
  Administered 2018-11-30: 0.4 mg via INTRAVENOUS

## 2018-11-30 MED ORDER — SODIUM CHLORIDE 0.9% FLUSH
10.0000 mL | Freq: Once | INTRAVENOUS | Status: AC
  Administered 2018-11-30: 10 mL
  Filled 2018-11-30: qty 10

## 2018-11-30 NOTE — Progress Notes (Signed)
Hematology and Oncology Follow Up Visit  Margaret Elliott [id number] 02/08/1951    11/30/18   Principle Diagnosis: 68 year old woman with colon cancer diagnosed in 2014.  She was found to have stage IV disease to the liver and the peritoneum with tumor that is microsatellite stable,  NRAS mutated.    Prior Therapy:  She is status post laparoscopic laparotomy right hemicolectomy with ileocolonic anastomosis done on December 09, 2012.   FOLFOX and a Avastin chemotherapy started 01/18/2013. She is S/P 12 cycles completed in 05/2013.   She is a status post microwave ablation of metastatic lesion in the posterior segment of the right lobe of the liver completed on 09/28/2014 and repeated on 11/30/2014.  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  FOLFIRI and a Avastin salvage therapy started on 12/10/2015.  Therapy discontinued and maintained on 5-FU leucovorin with a Avastin only till March 2020.  Current therapy:     FOLFIRI and a Avastin restarted on 10/05/2018.  He is here for cycle 6 of therapy.    Interim History:  Margaret Elliott is here for a follow-up.  Since the last visit, she tolerated the last cycle of chemotherapy without any major complaints.  She does report loose bowel habits the last for about 6 to 7 days.  She goes to the bathroom twice a day although no diarrhea, abdominal pain or hematochezia.  She remains active without any excessive nausea or vomiting.  Her appetite same and weight is stable.  Patient denied any alteration mental status, neuropathy, confusion or dizziness.  Denies any headaches or lethargy.  Denies any night sweats, weight loss or changes in appetite.  Denied orthopnea, dyspnea on exertion or chest discomfort.  Denies shortness of breath, difficulty breathing hemoptysis or cough.  Denies any abdominal distention, nausea, early satiety or dyspepsia.  Denies any hematuria, frequency,  dysuria or nocturia.  Denies any skin irritation, dryness or rash.  Denies any ecchymosis or petechiae.  Denies any lymphadenopathy or clotting.  Denies any heat or cold intolerance.  Denies any anxiety or depression.  Remaining review of system is negative.                 Medications: I have reviewed the patient's current medications.  Current Outpatient Prescriptions  Medication Sig Dispense Refill  . acetaminophen (TYLENOL) 500 MG tablet Take 500 mg by mouth every 6 (six) hours as needed for pain.      . diphenhydrAMINE (BENADRYL) 25 mg capsule Take 25-50 mg by mouth daily as needed for itching. For allergic reaction      . lidocaine-prilocaine (EMLA) cream Apply 1 application topically as needed. Apply approx 1/2 tsp to skin over port, prior to chemotherapy treatments  1 kit  3  . Multiple Vitamin (MULTIVITAMIN) tablet Take 1 tablet by mouth daily.      . ondansetron (ZOFRAN) 8 MG tablet Take 1 tablet (8 mg total) by mouth every 8 (eight) hours as needed for nausea.  30 tablet  1   No current facility-administered medications for this visit.     Allergies: No Known Allergies  Past Medical History, Surgical history, Social history, and Family History reviewed today and remain unchanged.    Physical Exam: Blood pressure 130/86, pulse 87, temperature 98.7 F (37.1 C), temperature source Oral, resp. rate 18, height '5\' 4"'$  (1.626 m), weight 154 lb 4.8 oz (70 kg), SpO2 97 %.    ECOG: 0  General appearance: Alert, awake without any distress. Head: Atraumatic without abnormalities Oropharynx: Without any thrush or ulcers. Eyes: No scleral icterus. Lymph nodes: No lymphadenopathy noted in the cervical, supraclavicular, or axillary nodes Heart:regular rate and rhythm, without any murmurs or gallops.   Lung: Clear to auscultation without any rhonchi, wheezes or dullness to percussion. Abdomin: Soft, nontender without any shifting dullness or ascites. Musculoskeletal: No  clubbing or cyanosis. Neurological: No motor or sensory deficits. Skin: No rashes or lesions.     CBC    Component Value Date/Time   WBC 14.0 (H) 11/30/2018 0900   WBC 4.2 09/08/2017 0834   RBC 3.57 (L) 11/30/2018 0900   HGB 11.7 (L) 11/30/2018 0900   HGB 12.3 05/26/2017 0809   HCT 36.1 11/30/2018 0900   HCT 37.6 05/26/2017 0809   PLT 264 11/30/2018 0900   PLT 180 05/26/2017 0809   MCV 101.1 (H) 11/30/2018 0900   MCV 100.3 05/26/2017 0809   MCH 32.8 11/30/2018 0900   MCHC 32.4 11/30/2018 0900   RDW 17.1 (H) 11/30/2018 0900   RDW 17.4 (H) 05/26/2017 0809   LYMPHSABS PENDING 11/30/2018 0900   LYMPHSABS 1.2 05/26/2017 0809   MONOABS PENDING 11/30/2018 0900   MONOABS 0.5 05/26/2017 0809   EOSABS PENDING 11/30/2018 0900   EOSABS 0.1 05/26/2017 0809   BASOSABS PENDING 11/30/2018 0900   BASOSABS 0.0 05/26/2017 0809     Results for Margaret Elliott (MRN [id number]) as of 11/30/2018 09:24  Ref. Range 11/02/2018 09:53 11/16/2018 09:00  CEA ([facility]) Latest Ref Range: 0.00 - 5.00 ng/mL 19.08 (H) 12.37 (H)       Impression and Plan:  68 year old woman with:   1.  Stage IV colon cancer with hepatic metastasis and subsequently peritoneal involvement diagnosed in 2014.  Marland Kitchen   She continues to tolerate FOLFIRI and Avastin without any major changes.  Her overall performance status and quality of life remains maintained without any decline.  Risks and benefits of continuing this therapy was reviewed.  Neutral complications were also reiterated.  Her clinical status continues to improve and he is experiencing decline in her CEA.  Plan is to continue with this therapy and repeat imaging studies before the next cycle of treatment.  Different salvage therapy may be needed if he develops progression of disease.    2.  IV access: Port-A-Cath remains in place and in use without any issues.   3. Neuropathy: Unchanged at this time without any recent exacerbation.  4.  Abdominal  discomfort: Very mild and manageable with occasional Tylenol oral ibuprofen.  5.  Anxiety: Overall manageable without any issues with her mood.  6.  Prognosis: Therapy remains palliative although her performance status is excellent and aggressive therapy is warranted.  7.  Antiemetics: Appetite remains reasonable with very little nausea that is manageable.  8.  Neutropenia: She had high risk of developing neutropenic fever and she will continue to receive growth factor support after each cycle of therapy.  9. Followup: We will be in 2 weeks for next treatment.  25 minutes spent today face-to-face with the patient.  More than 50% of today's visit was spent on reviewing her disease status, laboratory data answering questions regarding future plan of care.   Zola Button MD 11/30/18

## 2018-11-30 NOTE — Patient Instructions (Signed)

## 2018-11-30 NOTE — Patient Instructions (Signed)
[facility] Discharge Instructions for Patients Receiving Chemotherapy  Today you received the following chemotherapy agents Avastin, Irinotecan, Leukovorin, Fluorouracil  To help prevent nausea and vomiting after your treatment, we encourage you to take your nausea medication: As directed by your MD   If you develop nausea and vomiting that is not controlled by your nausea medication, call the clinic.   BELOW ARE SYMPTOMS THAT SHOULD BE REPORTED IMMEDIATELY:  *FEVER GREATER THAN 100.5 F  *CHILLS WITH OR WITHOUT FEVER  NAUSEA AND VOMITING THAT IS NOT CONTROLLED WITH YOUR NAUSEA MEDICATION  *UNUSUAL SHORTNESS OF BREATH  *UNUSUAL BRUISING OR BLEEDING  TENDERNESS IN MOUTH AND THROAT WITH OR WITHOUT PRESENCE OF ULCERS  *URINARY PROBLEMS  *BOWEL PROBLEMS  UNUSUAL RASH Items with * indicate a potential emergency and should be followed up as soon as possible.  Feel free to call the clinic should you have any questions or concerns. The clinic phone number is [phone].  Please show the [facility] at check-in to the Emergency Department and triage nurse.  Coronavirus (COVID-19) Are you at risk?  Are you at risk for the Coronavirus (COVID-19)?  To be considered HIGH RISK for Coronavirus (COVID-19), you have to meet the following criteria:  . Traveled to [place], [place], [place], [place] or [place]; or in the [place] to [place], [place], [place], or [place]; and have fever, cough, and shortness of breath within the last 2 weeks of travel OR . Been in close contact with a person diagnosed with COVID-19 within the last 2 weeks and have fever, cough, and shortness of breath . IF YOU DO NOT MEET THESE CRITERIA, YOU ARE CONSIDERED LOW RISK FOR COVID-19.  What to do if you are HIGH RISK for COVID-19?  Marland Kitchen If you are having a medical emergency, call 911. . Seek medical care right away. Before you go to a doctor's office, urgent care or emergency  department, call ahead and tell them about your recent travel, contact with someone diagnosed with COVID-19, and your symptoms. You should receive instructions from your physician's office regarding next steps of care.  . When you arrive at healthcare provider, tell the healthcare staff immediately you have returned from visiting [place], [place], [place], [place] or [place]; or traveled in the [place] to [place], [place], [place], or [place]; in the last two weeks or you have been in close contact with a person diagnosed with COVID-19 in the last 2 weeks.   . Tell the health care staff about your symptoms: fever, cough and shortness of breath. . After you have been seen by a medical provider, you will be either: o Tested for (COVID-19) and discharged home on quarantine except to seek medical care if symptoms worsen, and asked to  - Stay home and avoid contact with others until you get your results (4-5 days)  - Avoid travel on public transportation if possible (such as bus, train, or airplane) or o Sent to the Emergency Department by EMS for evaluation, COVID-19 testing, and possible admission depending on your condition and test results.  What to do if you are LOW RISK for COVID-19?  Reduce your risk of any infection by using the same precautions used for avoiding the common cold or flu:  Marland Kitchen Wash your hands often with soap and warm water for at least 20 seconds.  If soap and water are not readily available, use an alcohol-based hand sanitizer with at least 60% alcohol.  Marland Kitchen  If coughing or sneezing, cover your mouth and nose by coughing or sneezing into the elbow areas of your shirt or coat, into a tissue or into your sleeve (not your hands). . Avoid shaking hands with others and consider head nods or verbal greetings only. . Avoid touching your eyes, nose, or mouth with unwashed hands.  . Avoid close contact with people who are sick. . Avoid places or events with large numbers of people  in one location, like concerts or sporting events. . Carefully consider travel plans you have or are making. . If you are planning any travel outside or inside the [place], visit the CDC's Travelers' Health webpage for the latest health notices. . If you have some symptoms but not all symptoms, continue to monitor at home and seek medical attention if your symptoms worsen. . If you are having a medical emergency, call 911.   [place] / e-Visit: [email or web address]         [facility]: [facility]: [phone]                   [facility]: [phone]

## 2018-12-01 ENCOUNTER — Telehealth: Payer: Self-pay | Admitting: Oncology

## 2018-12-01 NOTE — Telephone Encounter (Signed)
I left a message regarding schedule  

## 2018-12-02 ENCOUNTER — Other Ambulatory Visit: Payer: Self-pay

## 2018-12-02 ENCOUNTER — Inpatient Hospital Stay: Payer: Medicare Other

## 2018-12-02 VITALS — BP 123/86 | HR 80 | Temp 98.0°F | Resp 18

## 2018-12-02 DIAGNOSIS — C786 Secondary malignant neoplasm of retroperitoneum and peritoneum: Secondary | ICD-10-CM | POA: Diagnosis not present

## 2018-12-02 DIAGNOSIS — C787 Secondary malignant neoplasm of liver and intrahepatic bile duct: Secondary | ICD-10-CM | POA: Diagnosis not present

## 2018-12-02 DIAGNOSIS — Z5111 Encounter for antineoplastic chemotherapy: Secondary | ICD-10-CM | POA: Diagnosis not present

## 2018-12-02 DIAGNOSIS — C189 Malignant neoplasm of colon, unspecified: Secondary | ICD-10-CM | POA: Diagnosis not present

## 2018-12-02 DIAGNOSIS — K769 Liver disease, unspecified: Secondary | ICD-10-CM

## 2018-12-02 DIAGNOSIS — K6389 Other specified diseases of intestine: Secondary | ICD-10-CM

## 2018-12-02 DIAGNOSIS — Z5112 Encounter for antineoplastic immunotherapy: Secondary | ICD-10-CM | POA: Diagnosis not present

## 2018-12-02 DIAGNOSIS — Z7689 Persons encountering health services in other specified circumstances: Secondary | ICD-10-CM | POA: Diagnosis not present

## 2018-12-02 MED ORDER — HEPARIN SOD (PORK) LOCK FLUSH 100 UNIT/ML IV SOLN
500.0000 [IU] | Freq: Once | INTRAVENOUS | Status: AC | PRN
  Administered 2018-12-02: 500 [IU]
  Filled 2018-12-02: qty 5

## 2018-12-02 MED ORDER — SODIUM CHLORIDE 0.9% FLUSH
10.0000 mL | INTRAVENOUS | Status: DC | PRN
  Administered 2018-12-02: 10 mL
  Filled 2018-12-02: qty 10

## 2018-12-02 MED ORDER — PEGFILGRASTIM INJECTION 6 MG/0.6ML ~~LOC~~
PREFILLED_SYRINGE | SUBCUTANEOUS | Status: AC
  Filled 2018-12-02: qty 0.6

## 2018-12-02 MED ORDER — PEGFILGRASTIM INJECTION 6 MG/0.6ML ~~LOC~~
6.0000 mg | PREFILLED_SYRINGE | Freq: Once | SUBCUTANEOUS | Status: AC
  Administered 2018-12-02: 6 mg via SUBCUTANEOUS

## 2018-12-02 NOTE — Patient Instructions (Signed)
Pegfilgrastim injection  What is this medicine?  PEGFILGRASTIM (PEG fil gra stim) is a long-acting granulocyte colony-stimulating factor that stimulates the growth of neutrophils, a type of white blood cell important in the body's fight against infection. It is used to reduce the incidence of fever and infection in patients with certain types of cancer who are receiving chemotherapy that affects the bone marrow, and to increase survival after being exposed to high doses of radiation.  This medicine may be used for other purposes; ask your health care provider or pharmacist if you have questions.  COMMON BRAND NAME(S): Fulphila, Neulasta, UDENYCA  What should I tell my health care provider before I take this medicine?  They need to know if you have any of these conditions:  -kidney disease  -latex allergy  -ongoing radiation therapy  -sickle cell disease  -skin reactions to acrylic adhesives (On-Body Injector only)  -an unusual or allergic reaction to pegfilgrastim, filgrastim, other medicines, foods, dyes, or preservatives  -pregnant or trying to get pregnant  -breast-feeding  How should I use this medicine?  This medicine is for injection under the skin. If you get this medicine at home, you will be taught how to prepare and give the pre-filled syringe or how to use the On-body Injector. Refer to the patient Instructions for Use for detailed instructions. Use exactly as directed. Tell your healthcare provider immediately if you suspect that the On-body Injector may not have performed as intended or if you suspect the use of the On-body Injector resulted in a missed or partial dose.  It is important that you put your used needles and syringes in a special sharps container. Do not put them in a trash can. If you do not have a sharps container, call your pharmacist or healthcare provider to get one.  Talk to your pediatrician regarding the use of this medicine in children. While this drug may be prescribed for  selected conditions, precautions do apply.  Overdosage: If you think you have taken too much of this medicine contact a poison control center or emergency room at once.  NOTE: This medicine is only for you. Do not share this medicine with others.  What if I miss a dose?  It is important not to miss your dose. Call your doctor or health care professional if you miss your dose. If you miss a dose due to an On-body Injector failure or leakage, a new dose should be administered as soon as possible using a single prefilled syringe for manual use.  What may interact with this medicine?  Interactions have not been studied.  Give your health care provider a list of all the medicines, herbs, non-prescription drugs, or dietary supplements you use. Also tell them if you smoke, drink alcohol, or use illegal drugs. Some items may interact with your medicine.  This list may not describe all possible interactions. Give your health care provider a list of all the medicines, herbs, non-prescription drugs, or dietary supplements you use. Also tell them if you smoke, drink alcohol, or use illegal drugs. Some items may interact with your medicine.  What should I watch for while using this medicine?  You may need blood work done while you are taking this medicine.  If you are going to need a MRI, CT scan, or other procedure, tell your doctor that you are using this medicine (On-Body Injector only).  What side effects may I notice from receiving this medicine?  Side effects that you should report to   your doctor or health care professional as soon as possible:  -allergic reactions like skin rash, itching or hives, swelling of the face, lips, or tongue  -back pain  -dizziness  -fever  -pain, redness, or irritation at site where injected  -pinpoint red spots on the skin  -red or dark-brown urine  -shortness of breath or breathing problems  -stomach or side pain, or pain at the shoulder  -swelling  -tiredness  -trouble passing urine or  change in the amount of urine  Side effects that usually do not require medical attention (report to your doctor or health care professional if they continue or are bothersome):  -bone pain  -muscle pain  This list may not describe all possible side effects. Call your doctor for medical advice about side effects. You may report side effects to FDA at 1-800-FDA-1088.  Where should I keep my medicine?  Keep out of the reach of children.  If you are using this medicine at home, you will be instructed on how to store it. Throw away any unused medicine after the expiration date on the label.  NOTE: This sheet is a summary. It may not cover all possible information. If you have questions about this medicine, talk to your doctor, pharmacist, or health care provider.   2019 Elsevier/Gold Standard (2017-09-13 16:57:08)

## 2018-12-07 ENCOUNTER — Ambulatory Visit (HOSPITAL_COMMUNITY)
Admission: RE | Admit: 2018-12-07 | Discharge: 2018-12-07 | Disposition: A | Discharge status: Home / Self Care | Payer: Medicare Other | Source: Ambulatory Visit | Attending: Oncology | Admitting: Oncology

## 2018-12-07 ENCOUNTER — Encounter (HOSPITAL_COMMUNITY): Payer: Self-pay

## 2018-12-07 ENCOUNTER — Other Ambulatory Visit: Payer: Self-pay

## 2018-12-07 DIAGNOSIS — C785 Secondary malignant neoplasm of large intestine and rectum: Secondary | ICD-10-CM | POA: Diagnosis not present

## 2018-12-07 DIAGNOSIS — C787 Secondary malignant neoplasm of liver and intrahepatic bile duct: Secondary | ICD-10-CM | POA: Insufficient documentation

## 2018-12-07 DIAGNOSIS — J439 Emphysema, unspecified: Secondary | ICD-10-CM | POA: Diagnosis not present

## 2018-12-07 DIAGNOSIS — C220 Liver cell carcinoma: Secondary | ICD-10-CM | POA: Diagnosis not present

## 2018-12-07 DIAGNOSIS — C189 Malignant neoplasm of colon, unspecified: Secondary | ICD-10-CM | POA: Diagnosis not present

## 2018-12-07 MED ORDER — SODIUM CHLORIDE (PF) 0.9 % IJ SOLN
INTRAMUSCULAR | Status: AC
  Filled 2018-12-07: qty 50

## 2018-12-07 MED ORDER — IOHEXOL 300 MG/ML  SOLN
100.0000 mL | Freq: Once | INTRAMUSCULAR | Status: AC | PRN
  Administered 2018-12-07: 100 mL via INTRAVENOUS

## 2018-12-07 MED ORDER — HEPARIN SOD (PORK) LOCK FLUSH 100 UNIT/ML IV SOLN
INTRAVENOUS | Status: AC
  Filled 2018-12-07: qty 5

## 2018-12-07 MED ORDER — HEPARIN SOD (PORK) LOCK FLUSH 100 UNIT/ML IV SOLN
500.0000 [IU] | Freq: Once | INTRAVENOUS | Status: AC
  Administered 2018-12-07: 500 [IU] via INTRAVENOUS

## 2018-12-14 ENCOUNTER — Other Ambulatory Visit: Payer: Self-pay

## 2018-12-14 ENCOUNTER — Inpatient Hospital Stay: Payer: Medicare Other

## 2018-12-14 ENCOUNTER — Inpatient Hospital Stay (HOSPITAL_BASED_OUTPATIENT_CLINIC_OR_DEPARTMENT_OTHER): Payer: Medicare Other | Admitting: Oncology

## 2018-12-14 VITALS — BP 129/79 | HR 86 | Temp 98.5°F | Resp 18 | Ht 64.0 in | Wt 154.3 lb

## 2018-12-14 VITALS — BP 119/77 | HR 83

## 2018-12-14 DIAGNOSIS — R109 Unspecified abdominal pain: Secondary | ICD-10-CM

## 2018-12-14 DIAGNOSIS — C189 Malignant neoplasm of colon, unspecified: Secondary | ICD-10-CM | POA: Diagnosis not present

## 2018-12-14 DIAGNOSIS — I700 Atherosclerosis of aorta: Secondary | ICD-10-CM

## 2018-12-14 DIAGNOSIS — G629 Polyneuropathy, unspecified: Secondary | ICD-10-CM

## 2018-12-14 DIAGNOSIS — J439 Emphysema, unspecified: Secondary | ICD-10-CM

## 2018-12-14 DIAGNOSIS — Z79899 Other long term (current) drug therapy: Secondary | ICD-10-CM | POA: Diagnosis not present

## 2018-12-14 DIAGNOSIS — Z7689 Persons encountering health services in other specified circumstances: Secondary | ICD-10-CM | POA: Diagnosis not present

## 2018-12-14 DIAGNOSIS — C787 Secondary malignant neoplasm of liver and intrahepatic bile duct: Secondary | ICD-10-CM

## 2018-12-14 DIAGNOSIS — R197 Diarrhea, unspecified: Secondary | ICD-10-CM

## 2018-12-14 DIAGNOSIS — Z95828 Presence of other vascular implants and grafts: Secondary | ICD-10-CM

## 2018-12-14 DIAGNOSIS — F419 Anxiety disorder, unspecified: Secondary | ICD-10-CM | POA: Diagnosis not present

## 2018-12-14 DIAGNOSIS — C786 Secondary malignant neoplasm of retroperitoneum and peritoneum: Secondary | ICD-10-CM | POA: Diagnosis not present

## 2018-12-14 DIAGNOSIS — K6389 Other specified diseases of intestine: Secondary | ICD-10-CM

## 2018-12-14 DIAGNOSIS — Z5111 Encounter for antineoplastic chemotherapy: Secondary | ICD-10-CM | POA: Diagnosis not present

## 2018-12-14 DIAGNOSIS — K769 Liver disease, unspecified: Secondary | ICD-10-CM

## 2018-12-14 DIAGNOSIS — Z5112 Encounter for antineoplastic immunotherapy: Secondary | ICD-10-CM | POA: Diagnosis not present

## 2018-12-14 LAB — CBC WITH DIFFERENTIAL (CANCER CENTER ONLY)
Abs Immature Granulocytes: 0.3 10*3/uL — ABNORMAL HIGH (ref 0.00–0.07)
Band Neutrophils: 11 %
Basophils Absolute: 0 10*3/uL (ref 0.0–0.1)
Basophils Relative: 0 %
Blasts: 2 %
Eosinophils Absolute: 0.2 10*3/uL (ref 0.0–0.5)
Eosinophils Relative: 1 %
HCT: 35.2 % — ABNORMAL LOW (ref 36.0–46.0)
Hemoglobin: 11.2 g/dL — ABNORMAL LOW (ref 12.0–15.0)
Lymphocytes Relative: 21 %
Lymphs Abs: 3.3 10*3/uL (ref 0.7–4.0)
MCH: 32.2 pg (ref 26.0–34.0)
MCHC: 31.8 g/dL (ref 30.0–36.0)
MCV: 101.1 fL — ABNORMAL HIGH (ref 80.0–100.0)
Metamyelocytes Relative: 1 %
Monocytes Absolute: 0.6 10*3/uL (ref 0.1–1.0)
Monocytes Relative: 4 %
Myelocytes: 1 %
Neutro Abs: 11.1 10*3/uL (ref 1.7–17.7)
Neutrophils Relative %: 59 %
Platelet Count: 238 10*3/uL (ref 150–400)
RBC: 3.48 MIL/uL — ABNORMAL LOW (ref 3.87–5.11)
RDW: 17.6 % — ABNORMAL HIGH (ref 11.5–15.5)
WBC Count: 15.8 10*3/uL — ABNORMAL HIGH (ref 4.0–10.5)
nRBC: 0.3 % — ABNORMAL HIGH (ref 0.0–0.2)

## 2018-12-14 LAB — CMP (CANCER CENTER ONLY)
ALT: 13 U/L (ref 0–44)
AST: 17 U/L (ref 15–41)
Albumin: 3.4 g/dL — ABNORMAL LOW (ref 3.5–5.0)
Alkaline Phosphatase: 144 U/L — ABNORMAL HIGH (ref 38–126)
Anion gap: 10 (ref 5–15)
BUN: 15 mg/dL (ref 8–23)
CO2: 23 mmol/L (ref 22–32)
Calcium: 8.9 mg/dL (ref 8.9–10.3)
Chloride: 108 mmol/L (ref 98–111)
Creatinine: 1.18 mg/dL — ABNORMAL HIGH (ref 0.44–1.00)
GFR, Est AFR Am: 55 mL/min — ABNORMAL LOW (ref >60)
GFR, Estimated: 47 mL/min — ABNORMAL LOW (ref >60)
Glucose, Bld: 108 mg/dL — ABNORMAL HIGH (ref 70–99)
Potassium: 3.7 mmol/L (ref 3.5–5.1)
Sodium: 141 mmol/L (ref 135–145)
Total Bilirubin: 0.4 mg/dL (ref 0.3–1.2)
Total Protein: 7 g/dL (ref 6.5–8.1)

## 2018-12-14 LAB — CEA (IN HOUSE-CHCC): CEA (CHCC-In House): 35.26 ng/mL — ABNORMAL HIGH (ref 0.00–5.00)

## 2018-12-14 MED ORDER — ATROPINE SULFATE 1 MG/ML IJ SOLN: 0.5000 mg | Freq: Once | INTRAMUSCULAR | Status: DC | PRN

## 2018-12-14 MED ORDER — SODIUM CHLORIDE 0.9 % IV SOLN
Freq: Once | INTRAVENOUS | Status: AC
  Administered 2018-12-14: 10:00:00 via INTRAVENOUS
  Filled 2018-12-14: qty 250

## 2018-12-14 MED ORDER — LEUCOVORIN CALCIUM INJECTION 350 MG
400.0000 mg/m2 | Freq: Once | INTRAVENOUS | Status: AC
  Administered 2018-12-14: 712 mg via INTRAVENOUS
  Filled 2018-12-14: qty 35.6

## 2018-12-14 MED ORDER — ATROPINE SULFATE 1 MG/ML IJ SOLN: 0.4000 mg | Freq: Once | INTRAMUSCULAR | Status: DC

## 2018-12-14 MED ORDER — SODIUM CHLORIDE 0.9% FLUSH
10.0000 mL | INTRAVENOUS | Status: DC | PRN
  Filled 2018-12-14: qty 10

## 2018-12-14 MED ORDER — DEXAMETHASONE SODIUM PHOSPHATE 10 MG/ML IJ SOLN
INTRAMUSCULAR | Status: AC
  Filled 2018-12-14: qty 1

## 2018-12-14 MED ORDER — ATROPINE SULFATE 0.4 MG/ML IJ SOLN
INTRAMUSCULAR | Status: AC
  Filled 2018-12-14: qty 1

## 2018-12-14 MED ORDER — ATROPINE SULFATE 0.4 MG/ML IJ SOLN
0.4000 mg | Freq: Once | INTRAMUSCULAR | Status: AC | PRN
  Administered 2018-12-14: 0.4 mg via INTRAVENOUS

## 2018-12-14 MED ORDER — PALONOSETRON HCL INJECTION 0.25 MG/5ML
0.2500 mg | Freq: Once | INTRAVENOUS | Status: AC
  Administered 2018-12-14: 0.25 mg via INTRAVENOUS

## 2018-12-14 MED ORDER — IRINOTECAN HCL CHEMO INJECTION 100 MG/5ML
180.0000 mg/m2 | Freq: Once | INTRAVENOUS | Status: AC
  Administered 2018-12-14: 320 mg via INTRAVENOUS
  Filled 2018-12-14: qty 15

## 2018-12-14 MED ORDER — PALONOSETRON HCL INJECTION 0.25 MG/5ML
INTRAVENOUS | Status: AC
  Filled 2018-12-14: qty 5

## 2018-12-14 MED ORDER — DEXAMETHASONE SODIUM PHOSPHATE 10 MG/ML IJ SOLN
10.0000 mg | Freq: Once | INTRAMUSCULAR | Status: AC
  Administered 2018-12-14: 10 mg via INTRAVENOUS

## 2018-12-14 MED ORDER — SODIUM CHLORIDE 0.9 % IV SOLN
4300.0000 mg | INTRAVENOUS | Status: DC
  Administered 2018-12-14: 4300 mg via INTRAVENOUS
  Filled 2018-12-14: qty 86

## 2018-12-14 MED ORDER — HEPARIN SOD (PORK) LOCK FLUSH 100 UNIT/ML IV SOLN
500.0000 [IU] | Freq: Once | INTRAVENOUS | Status: DC | PRN
  Filled 2018-12-14: qty 5

## 2018-12-14 MED ORDER — FLUOROURACIL CHEMO INJECTION 2.5 GM/50ML
400.0000 mg/m2 | Freq: Once | INTRAVENOUS | Status: AC
  Administered 2018-12-14: 700 mg via INTRAVENOUS
  Filled 2018-12-14: qty 14

## 2018-12-14 MED ORDER — SODIUM CHLORIDE 0.9 % IV SOLN
5.0000 mg/kg | Freq: Once | INTRAVENOUS | Status: AC
  Administered 2018-12-14: 350 mg via INTRAVENOUS
  Filled 2018-12-14: qty 14

## 2018-12-14 MED ORDER — SODIUM CHLORIDE 0.9% FLUSH
10.0000 mL | Freq: Once | INTRAVENOUS | Status: AC
  Administered 2018-12-14: 09:00:00 10 mL
  Filled 2018-12-14: qty 10

## 2018-12-14 MED ORDER — SODIUM CHLORIDE 0.9 % IV SOLN: 10.0000 mg | Freq: Once | INTRAVENOUS | Status: DC

## 2018-12-14 NOTE — Progress Notes (Signed)
ON PATHWAY REGIMEN - Colorectal  No Change  Continue With Treatment as Ordered.     A cycle is every 14 days:     Irinotecan      Leucovorin      5-Fluorouracil      5-Fluorouracil      Bevacizumab-xxxx   **Always confirm dose/schedule in your pharmacy ordering system**  Patient Characteristics: Distant Metastases, Second Line, KRAS Mutation Positive/Unknown, BRAF Wild-Type/Unknown, Bevacizumab Eligible Therapeutic Status: Distant Metastases BRAF Mutation Status: Wild-Type (no mutation) KRAS/NRAS Mutation Status: Mutation Positive Line of Therapy: Second Line  Intent of Therapy: Non-Curative / Palliative Intent, Discussed with Patient 

## 2018-12-14 NOTE — Patient Instructions (Signed)

## 2018-12-14 NOTE — Patient Instructions (Signed)
[facility] Discharge Instructions for Patients Receiving Chemotherapy  Today you received the following chemotherapy agents :  Avastin, Irinotecan, Leucovorin, Fluorouracil.  To help prevent nausea and vomiting after your treatment, we encourage you to take your nausea medication as prescribed.   If you develop nausea and vomiting that is not controlled by your nausea medication, call the clinic.   BELOW ARE SYMPTOMS THAT SHOULD BE REPORTED IMMEDIATELY:  *FEVER GREATER THAN 100.5 F  *CHILLS WITH OR WITHOUT FEVER  NAUSEA AND VOMITING THAT IS NOT CONTROLLED WITH YOUR NAUSEA MEDICATION  *UNUSUAL SHORTNESS OF BREATH  *UNUSUAL BRUISING OR BLEEDING  TENDERNESS IN MOUTH AND THROAT WITH OR WITHOUT PRESENCE OF ULCERS  *URINARY PROBLEMS  *BOWEL PROBLEMS  UNUSUAL RASH Items with * indicate a potential emergency and should be followed up as soon as possible.  Feel free to call the clinic should you have any questions or concerns. The clinic phone number is [phone].  Please show the [facility] at check-in to the Emergency Department and triage nurse.

## 2018-12-14 NOTE — Progress Notes (Signed)
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    Prior Therapy:  She is status post laparoscopic laparotomy right hemicolectomy with ileocolonic anastomosis done on December 09, 2012.   FOLFOX and a Avastin chemotherapy started 01/18/2013. She is S/P 12 cycles completed in 05/2013.   She is a status post microwave ablation of metastatic lesion in the posterior segment of the right lobe of the liver completed on 09/28/2014 and repeated on 11/30/2014.  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  FOLFIRI and a Avastin salvage therapy started on 12/10/2015.  Therapy discontinued and maintained on 5-FU leucovorin with a Avastin only till March 2020.  Current therapy:     FOLFIRI and a Avastin restarted on 10/05/2018.   She is status post 6 cycles of therapy and here for cycle 7.   Interim History:  Margaret Elliott returns today for a repeat evaluation.  Since the last visit, she reports no major changes in her health.  She tolerated the last cycle of chemotherapy with few complaints including some nausea and diarrhea that has resolved at this time.  She remains active and continues to attend activities of daily living.  She denies any hospitalization or illnesses.  She denies any worsening abdominal pain or discomfort.   She denied headaches, blurry vision, syncope or seizures.  Denies any fevers, chills or sweats.  Denied chest pain, palpitation, orthopnea or leg edema.  Denied cough, wheezing or hemoptysis.  Denied nausea, vomiting or abdominal pain.  Denies any constipation or diarrhea.  Denies any frequency urgency or hesitancy.  Denies any arthralgias or myalgias.  Denies any skin rashes  or lesions.  Denies any bleeding or clotting tendency.  Denies any easy bruising.  Denies any hair or nail changes.  Denies any anxiety or depression.  Remaining review of system is negative.                  Medications: I have reviewed the patient's current medications.  Current Outpatient Prescriptions  Medication Sig Dispense Refill  . acetaminophen (TYLENOL) 500 MG tablet Take 500 mg by mouth every 6 (six) hours as needed for pain.      . diphenhydrAMINE (BENADRYL) 25 mg capsule Take 25-50 mg by mouth daily as needed for itching. For allergic reaction      . lidocaine-prilocaine (EMLA) cream Apply 1 application topically as needed. Apply approx 1/2 tsp to skin over port, prior to chemotherapy treatments  1 kit  3  . Multiple Vitamin (MULTIVITAMIN) tablet Take 1 tablet by mouth daily.      . ondansetron (ZOFRAN) 8 MG tablet Take 1 tablet (8 mg total) by mouth every 8 (eight) hours as needed for nausea.  30 tablet  1   No current facility-administered medications for this visit.     Allergies: No Known Allergies  Past Medical History, Surgical history, Social history, and Family History reviewed today and remain unchanged.    Physical Exam:  Blood pressure 129/79, pulse 86, temperature 98.5 F (36.9 C), temperature source Oral, resp. rate 18, height _0  (1.626 m), weight 154 lb 4.8 oz (70 kg), SpO2 100 %.    ECOG: 0    General appearance: Comfortable appearing  without any discomfort Head: Normocephalic without any trauma Oropharynx: Mucous membranes are moist and pink without any thrush or ulcers. Eyes: Pupils are equal and round reactive to light. Lymph nodes: No cervical, supraclavicular, inguinal or axillary lymphadenopathy.   Heart:regular rate and rhythm.  S1 and S2 without leg edema. Lung: Clear without any rhonchi or wheezes.  No dullness to percussion. Abdomin: Soft, nontender, nondistended with good bowel sounds.  No  hepatosplenomegaly. Musculoskeletal: No joint deformity or effusion.  Full range of motion noted. Neurological: No deficits noted on motor, sensory and deep tendon reflex exam. Skin: No petechial rash or dryness.  Appeared moist.       CBC    Component Value Date/Time   WBC 14.0 (H) 11/30/2018 0900   WBC 4.2 09/08/2017 0834   RBC 3.57 (L) 11/30/2018 0900   HGB 11.7 (L) 11/30/2018 0900   HGB 12.3 05/26/2017 0809   HCT 36.1 11/30/2018 0900   HCT 37.6 05/26/2017 0809   PLT 264 11/30/2018 0900   PLT 180 05/26/2017 0809   MCV 101.1 (H) 11/30/2018 0900   MCV 100.3 05/26/2017 0809   MCH 32.8 11/30/2018 0900   MCHC 32.4 11/30/2018 0900   RDW 17.1 (H) 11/30/2018 0900   RDW 17.4 (H) 05/26/2017 0809   LYMPHSABS 2.1 11/30/2018 0900   LYMPHSABS 1.2 05/26/2017 0809   MONOABS 0.9 11/30/2018 0900   MONOABS 0.5 05/26/2017 0809   EOSABS 0.1 11/30/2018 0900   EOSABS 0.1 05/26/2017 0809   BASOSABS 0.1 11/30/2018 0900   BASOSABS 0.0 05/26/2017 0809      Results for DAWNYEL, LEVEN (MRN [id number]) as of 12/14/2018 08:56  Ref. Range 11/16/2018 09:00 11/30/2018 09:00  [facility] ([facility]) Latest Ref Range: 0.00 - 5.00 ng/mL 12.37 (H) 35.60 (H)    EXAM: CT CHEST, ABDOMEN, AND PELVIS WITH CONTRAST  TECHNIQUE: Multidetector CT imaging of the chest, abdomen and pelvis was performed following the standard protocol during bolus administration of intravenous contrast.  CONTRAST:  124m OMNIPAQUE IOHEXOL 300 MG/ML  SOLN  COMPARISON:  09/05/2018  FINDINGS: CT CHEST FINDINGS  Cardiovascular: Left Port-A-Cath tip at high right atrium. Normal heart size, without pericardial effusion. Lad coronary artery atherosclerosis. No central pulmonary embolism, on this non-dedicated study.  Mediastinum/Nodes: No supraclavicular adenopathy. No mediastinal or hilar adenopathy.  Lungs/Pleura: No pleural fluid.  Mild centrilobular emphysema.  Clear lungs.  Musculoskeletal: No acute osseous  abnormality.  CT ABDOMEN PELVIS FINDINGS  Hepatobiliary: Segment 7 ablation defect is again identified, including at 4.8 x 2.1 cm on 51/2, similar. No locally recurrent disease. No new liver lesions. Focal steatosis adjacent the falciform ligament. Normal gallbladder, without biliary ductal dilatation.  Pancreas: Normal, without mass or ductal dilatation.  Spleen: Normal in size, without focal abnormality.  Adrenals/Urinary Tract: Normal adrenal glands. Too small to characterize lesions in both kidneys. An interpolar right renal 1.9 cm cyst. No hydronephrosis. Decompressed urinary bladder.  Stomach/Bowel: Normal stomach, without wall thickening. Suspect partial left hepatectomy. Normal small bowel.  Vascular/Lymphatic: Aortic and branch vessel atherosclerosis. Retroaortic left renal vein. No abdominopelvic adenopathy.  Reproductive: Hysterectomy.  No adnexal mass.  Other: No significant free fluid. Mild pelvic floor laxity. Anterior left intraabdominal soft tissue implant measures 2.0 x 2.0 cm on image 77/2. Compare 2.5 x 2.1 cm on the prior exam (when remeasured).  Right omental nodule measures 11 mm on image 81/2 and is felt to be similar to 10 mm on the prior.  Left abdominal omental nodule measures 12 mm on image 68/2  versus 13 mm on the prior.  No abdominal ascites.  At least 1 small ventral abdominal wall hernia containing fat.  Musculoskeletal: Advanced degenerate disc disease at the lumbosacral junction. No focal osseous lesion.  IMPRESSION: 1. Similar to minimal improvement in omental/peritoneal metastasis. The majority of peritoneal lesions are similar. A dominant lesion, along the left paramidline abdominal musculature, is slightly decreased. 2. Similar right hepatic lobe ablation defect, without locally recurrent or new hepatic metastasis. 3.  No acute process or evidence of metastatic disease in the chest. 4. Aortic atherosclerosis  (ICD10-I70.0), coronary artery atherosclerosis and emphysema (ICD10-J43.9).    Impression and Plan:  68 year old woman with:   1.  Colon cancer diagnosed in 2014.  She developed a hepatic and peritoneal metastasis subsequently.     He remains on chemotherapy without any major complications.  CT scan obtained on 12/07/2018 was personally reviewed and discussed with the patient.  Her remains relatively stable with some improvement in the peritoneal implants outlined above.  There are no new areas of metastasis noted at this time.  The natural course of her disease as well as risks and benefits of continuing therapy was discussed.  Alternative options were also reviewed including maintenance chemotherapy without iron OT can.  After discussion, we elected to continue with the current dose of regimen and eliminate irinotecan in the future and stick with maintenance chemotherapy at that point.  But for the time being she will continue with FOLFOX and Avastin.    2.  IV access: Port-A-Cath continues to be in use without any issues.   3. Neuropathy: Manageable and without any major changes at this time.  4.  Abdominal discomfort: No recent exacerbation at this time.  Manageable with minimum pain medication at this time.  5.  Anxiety: Mood remains stable at this time.  6.  Prognosis: Disease remains incurable although process is excellent and aggressive therapy is warranted.  7.  Antiemetics: No major nausea or vomiting reported.  Antiemetics are available to her.  8.  Neutropenia: She will continue to receive growth factor support after each cycle of therapy.  9. Followup: In 3 weeks for the next cycle of therapy.  25 minutes spent today face-to-face with the patient.  More than 50% of today's visit was dedicated to reviewing her disease status, reviewing imaging studies, treatment options and answering questions regarding future plan of care.   Zola Button MD 12/14/18

## 2018-12-15 ENCOUNTER — Telehealth: Payer: Self-pay | Admitting: Oncology

## 2018-12-15 NOTE — Telephone Encounter (Signed)
Scheduled per los. Called and left msg  ° °

## 2018-12-16 ENCOUNTER — Inpatient Hospital Stay: Payer: Medicare Other

## 2018-12-16 ENCOUNTER — Other Ambulatory Visit: Payer: Self-pay

## 2018-12-16 VITALS — BP 122/72 | HR 78 | Temp 98.5°F | Resp 18

## 2018-12-16 DIAGNOSIS — C786 Secondary malignant neoplasm of retroperitoneum and peritoneum: Secondary | ICD-10-CM | POA: Diagnosis not present

## 2018-12-16 DIAGNOSIS — C787 Secondary malignant neoplasm of liver and intrahepatic bile duct: Secondary | ICD-10-CM | POA: Diagnosis not present

## 2018-12-16 DIAGNOSIS — C189 Malignant neoplasm of colon, unspecified: Secondary | ICD-10-CM | POA: Diagnosis not present

## 2018-12-16 DIAGNOSIS — Z5112 Encounter for antineoplastic immunotherapy: Secondary | ICD-10-CM | POA: Diagnosis not present

## 2018-12-16 DIAGNOSIS — K6389 Other specified diseases of intestine: Secondary | ICD-10-CM

## 2018-12-16 DIAGNOSIS — Z7689 Persons encountering health services in other specified circumstances: Secondary | ICD-10-CM | POA: Diagnosis not present

## 2018-12-16 DIAGNOSIS — Z5111 Encounter for antineoplastic chemotherapy: Secondary | ICD-10-CM | POA: Diagnosis not present

## 2018-12-16 DIAGNOSIS — K769 Liver disease, unspecified: Secondary | ICD-10-CM

## 2018-12-16 MED ORDER — PEGFILGRASTIM INJECTION 6 MG/0.6ML ~~LOC~~
6.0000 mg | PREFILLED_SYRINGE | Freq: Once | SUBCUTANEOUS | Status: AC
  Administered 2018-12-16: 6 mg via SUBCUTANEOUS

## 2018-12-16 MED ORDER — SODIUM CHLORIDE 0.9% FLUSH
10.0000 mL | INTRAVENOUS | Status: DC | PRN
  Administered 2018-12-16: 10 mL
  Filled 2018-12-16: qty 10

## 2018-12-16 MED ORDER — PEGFILGRASTIM INJECTION 6 MG/0.6ML ~~LOC~~
PREFILLED_SYRINGE | SUBCUTANEOUS | Status: AC
  Filled 2018-12-16: qty 0.6

## 2018-12-16 MED ORDER — HEPARIN SOD (PORK) LOCK FLUSH 100 UNIT/ML IV SOLN
500.0000 [IU] | Freq: Once | INTRAVENOUS | Status: AC | PRN
  Administered 2018-12-16: 500 [IU]
  Filled 2018-12-16: qty 5

## 2019-01-04 ENCOUNTER — Inpatient Hospital Stay: Payer: Medicare Other | Attending: Oncology

## 2019-01-04 ENCOUNTER — Inpatient Hospital Stay: Payer: Medicare Other

## 2019-01-04 ENCOUNTER — Inpatient Hospital Stay (HOSPITAL_BASED_OUTPATIENT_CLINIC_OR_DEPARTMENT_OTHER): Payer: Medicare Other | Admitting: Oncology

## 2019-01-04 ENCOUNTER — Telehealth: Payer: Self-pay | Admitting: Oncology

## 2019-01-04 ENCOUNTER — Other Ambulatory Visit: Payer: Self-pay

## 2019-01-04 VITALS — BP 131/89 | HR 85 | Temp 97.8°F | Resp 18 | Ht 64.0 in | Wt 153.5 lb

## 2019-01-04 DIAGNOSIS — Z79899 Other long term (current) drug therapy: Secondary | ICD-10-CM

## 2019-01-04 DIAGNOSIS — Z5111 Encounter for antineoplastic chemotherapy: Secondary | ICD-10-CM | POA: Insufficient documentation

## 2019-01-04 DIAGNOSIS — R110 Nausea: Secondary | ICD-10-CM | POA: Diagnosis not present

## 2019-01-04 DIAGNOSIS — Z95828 Presence of other vascular implants and grafts: Secondary | ICD-10-CM

## 2019-01-04 DIAGNOSIS — C786 Secondary malignant neoplasm of retroperitoneum and peritoneum: Secondary | ICD-10-CM | POA: Diagnosis not present

## 2019-01-04 DIAGNOSIS — C189 Malignant neoplasm of colon, unspecified: Secondary | ICD-10-CM

## 2019-01-04 DIAGNOSIS — Z7689 Persons encountering health services in other specified circumstances: Secondary | ICD-10-CM | POA: Insufficient documentation

## 2019-01-04 DIAGNOSIS — K769 Liver disease, unspecified: Secondary | ICD-10-CM

## 2019-01-04 DIAGNOSIS — G893 Neoplasm related pain (acute) (chronic): Secondary | ICD-10-CM | POA: Insufficient documentation

## 2019-01-04 DIAGNOSIS — F418 Other specified anxiety disorders: Secondary | ICD-10-CM | POA: Insufficient documentation

## 2019-01-04 DIAGNOSIS — G629 Polyneuropathy, unspecified: Secondary | ICD-10-CM

## 2019-01-04 DIAGNOSIS — K6389 Other specified diseases of intestine: Secondary | ICD-10-CM

## 2019-01-04 DIAGNOSIS — C787 Secondary malignant neoplasm of liver and intrahepatic bile duct: Secondary | ICD-10-CM

## 2019-01-04 DIAGNOSIS — G620 Drug-induced polyneuropathy: Secondary | ICD-10-CM | POA: Insufficient documentation

## 2019-01-04 DIAGNOSIS — T451X5S Adverse effect of antineoplastic and immunosuppressive drugs, sequela: Secondary | ICD-10-CM | POA: Insufficient documentation

## 2019-01-04 DIAGNOSIS — D701 Agranulocytosis secondary to cancer chemotherapy: Secondary | ICD-10-CM | POA: Insufficient documentation

## 2019-01-04 DIAGNOSIS — R5383 Other fatigue: Secondary | ICD-10-CM | POA: Diagnosis not present

## 2019-01-04 DIAGNOSIS — Z5112 Encounter for antineoplastic immunotherapy: Secondary | ICD-10-CM | POA: Insufficient documentation

## 2019-01-04 DIAGNOSIS — R109 Unspecified abdominal pain: Secondary | ICD-10-CM | POA: Diagnosis not present

## 2019-01-04 LAB — CBC WITH DIFFERENTIAL (CANCER CENTER ONLY)
Abs Immature Granulocytes: 0.13 10*3/uL — ABNORMAL HIGH (ref 0.00–0.07)
Basophils Absolute: 0.1 10*3/uL (ref 0.0–0.1)
Basophils Relative: 1 %
Eosinophils Absolute: 0.2 10*3/uL (ref 0.0–0.5)
Eosinophils Relative: 2 %
HCT: 36.8 % (ref 36.0–46.0)
Hemoglobin: 11.8 g/dL — ABNORMAL LOW (ref 12.0–15.0)
Immature Granulocytes: 1 %
Lymphocytes Relative: 16 %
Lymphs Abs: 1.8 10*3/uL (ref 0.7–4.0)
MCH: 32.4 pg (ref 26.0–34.0)
MCHC: 32.1 g/dL (ref 30.0–36.0)
MCV: 101.1 fL — ABNORMAL HIGH (ref 80.0–100.0)
Monocytes Absolute: 1 10*3/uL (ref 0.1–1.0)
Monocytes Relative: 9 %
Neutro Abs: 7.8 10*3/uL — ABNORMAL HIGH (ref 1.7–7.7)
Neutrophils Relative %: 71 %
Platelet Count: 301 10*3/uL (ref 150–400)
RBC: 3.64 MIL/uL — ABNORMAL LOW (ref 3.87–5.11)
RDW: 17.4 % — ABNORMAL HIGH (ref 11.5–15.5)
WBC Count: 10.9 10*3/uL — ABNORMAL HIGH (ref 4.0–10.5)
nRBC: 0 % (ref 0.0–0.2)

## 2019-01-04 LAB — CMP (CANCER CENTER ONLY)
ALT: 12 U/L (ref 0–44)
AST: 17 U/L (ref 15–41)
Albumin: 3.5 g/dL (ref 3.5–5.0)
Alkaline Phosphatase: 128 U/L — ABNORMAL HIGH (ref 38–126)
Anion gap: 8 (ref 5–15)
BUN: 21 mg/dL (ref 8–23)
CO2: 24 mmol/L (ref 22–32)
Calcium: 9 mg/dL (ref 8.9–10.3)
Chloride: 107 mmol/L (ref 98–111)
Creatinine: 0.96 mg/dL (ref 0.44–1.00)
GFR, Est AFR Am: >60 mL/min (ref >60)
GFR, Estimated: >60 mL/min (ref >60)
Glucose, Bld: 83 mg/dL (ref 70–99)
Potassium: 4.1 mmol/L (ref 3.5–5.1)
Sodium: 139 mmol/L (ref 135–145)
Total Bilirubin: 0.5 mg/dL (ref 0.3–1.2)
Total Protein: 7.4 g/dL (ref 6.5–8.1)

## 2019-01-04 LAB — CEA (IN HOUSE-CHCC): CEA (CHCC-In House): 23.99 ng/mL — ABNORMAL HIGH (ref 0.00–5.00)

## 2019-01-04 LAB — TOTAL PROTEIN, URINE DIPSTICK: Protein, ur: NEGATIVE mg/dL

## 2019-01-04 MED ORDER — SODIUM CHLORIDE 0.9 % IV SOLN
2400.0000 mg/m2 | INTRAVENOUS | Status: DC
  Administered 2019-01-04: 4250 mg via INTRAVENOUS
  Filled 2019-01-04: qty 85

## 2019-01-04 MED ORDER — DEXAMETHASONE SODIUM PHOSPHATE 10 MG/ML IJ SOLN
10.0000 mg | Freq: Once | INTRAMUSCULAR | Status: AC
  Administered 2019-01-04: 10 mg via INTRAVENOUS

## 2019-01-04 MED ORDER — FLUOROURACIL CHEMO INJECTION 2.5 GM/50ML
400.0000 mg/m2 | Freq: Once | INTRAVENOUS | Status: AC
  Administered 2019-01-04: 700 mg via INTRAVENOUS
  Filled 2019-01-04: qty 14

## 2019-01-04 MED ORDER — SODIUM CHLORIDE 0.9 % IV SOLN
5.0000 mg/kg | Freq: Once | INTRAVENOUS | Status: AC
  Administered 2019-01-04: 350 mg via INTRAVENOUS
  Filled 2019-01-04: qty 14

## 2019-01-04 MED ORDER — PALONOSETRON HCL INJECTION 0.25 MG/5ML
INTRAVENOUS | Status: AC
  Filled 2019-01-04: qty 5

## 2019-01-04 MED ORDER — ATROPINE SULFATE 1 MG/ML IJ SOLN
INTRAMUSCULAR | Status: AC
  Filled 2019-01-04: qty 1

## 2019-01-04 MED ORDER — DEXAMETHASONE SODIUM PHOSPHATE 10 MG/ML IJ SOLN
INTRAMUSCULAR | Status: AC
  Filled 2019-01-04: qty 1

## 2019-01-04 MED ORDER — ATROPINE SULFATE 1 MG/ML IJ SOLN
0.5000 mg | Freq: Once | INTRAMUSCULAR | Status: AC | PRN
  Administered 2019-01-04: 0.5 mg via INTRAVENOUS

## 2019-01-04 MED ORDER — LEUCOVORIN CALCIUM INJECTION 350 MG
400.0000 mg/m2 | Freq: Once | INTRAVENOUS | Status: AC
  Administered 2019-01-04: 712 mg via INTRAVENOUS
  Filled 2019-01-04: qty 35.6

## 2019-01-04 MED ORDER — PALONOSETRON HCL INJECTION 0.25 MG/5ML
0.2500 mg | Freq: Once | INTRAVENOUS | Status: AC
  Administered 2019-01-04: 0.25 mg via INTRAVENOUS

## 2019-01-04 MED ORDER — SODIUM CHLORIDE 0.9 % IV SOLN
Freq: Once | INTRAVENOUS | Status: AC
  Administered 2019-01-04: 10:00:00 via INTRAVENOUS
  Filled 2019-01-04: qty 250

## 2019-01-04 MED ORDER — SODIUM CHLORIDE 0.9 % IV SOLN
Freq: Once | INTRAVENOUS | Status: AC
  Administered 2019-01-04: 11:00:00 via INTRAVENOUS
  Filled 2019-01-04: qty 250

## 2019-01-04 MED ORDER — SODIUM CHLORIDE 0.9% FLUSH
10.0000 mL | Freq: Once | INTRAVENOUS | Status: AC
  Administered 2019-01-04: 10 mL
  Filled 2019-01-04: qty 10

## 2019-01-04 MED ORDER — IRINOTECAN HCL CHEMO INJECTION 100 MG/5ML
180.0000 mg/m2 | Freq: Once | INTRAVENOUS | Status: AC
  Administered 2019-01-04: 11:00:00 320 mg via INTRAVENOUS
  Filled 2019-01-04: qty 15

## 2019-01-04 NOTE — Telephone Encounter (Signed)
Gave avs and calendar ° °

## 2019-01-04 NOTE — Patient Instructions (Signed)
[facility] Discharge Instructions for Patients Receiving Chemotherapy  Today you received the following chemotherapy agents :  Avastin, Irinotecan, Leucovorin, Fluorouracil.  To help prevent nausea and vomiting after your treatment, we encourage you to take your nausea medication as prescribed.   If you develop nausea and vomiting that is not controlled by your nausea medication, call the clinic.   BELOW ARE SYMPTOMS THAT SHOULD BE REPORTED IMMEDIATELY:  *FEVER GREATER THAN 100.5 F  *CHILLS WITH OR WITHOUT FEVER  NAUSEA AND VOMITING THAT IS NOT CONTROLLED WITH YOUR NAUSEA MEDICATION  *UNUSUAL SHORTNESS OF BREATH  *UNUSUAL BRUISING OR BLEEDING  TENDERNESS IN MOUTH AND THROAT WITH OR WITHOUT PRESENCE OF ULCERS  *URINARY PROBLEMS  *BOWEL PROBLEMS  UNUSUAL RASH Items with * indicate a potential emergency and should be followed up as soon as possible.  Feel free to call the clinic should you have any questions or concerns. The clinic phone number is [phone].  Please show the [facility] at check-in to the Emergency Department and triage nurse.

## 2019-01-04 NOTE — Progress Notes (Signed)
Hematology and Oncology Follow Up Visit  Margaret Elliott [id number] 02-22-1951    01/04/19   Principle Diagnosis: 68 year old woman with colon cancer diagnosed in 2014.  She was found to have stage IV disease with hepatic and peritoneal involvement. Her tumor is tumor that is microsatellite stable,  NRAS mutated based on a biopsy obtained on 2017.   Prior Therapy:  She is status post laparoscopic laparotomy right hemicolectomy with ileocolonic anastomosis done on December 09, 2012.   FOLFOX and a Avastin chemotherapy started 01/18/2013. She is S/P 12 cycles completed in 05/2013.   She is a status post microwave ablation of metastatic lesion in the posterior segment of the right lobe of the liver completed on 09/28/2014 and repeated on 11/30/2014.  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  FOLFIRI and a Avastin salvage therapy started on 12/10/2015.  Therapy discontinued and maintained on 5-FU leucovorin with a Avastin only till March 2020.  Current therapy:     FOLFIRI and a Avastin restarted on 10/05/2018.   She is here for cycle 8 of therapy.   Interim History:  Margaret Elliott is here for a follow-up.  Since last visit, she tolerated the last cycle of chemotherapy with few complaints.  She does report some mild nausea that has resolved at this time.  She denies any abdominal pain or worsening discomfort.  She denies any diarrhea or changes in her appetite.  Her performance status and activity level remains unchanged.  Her quality of life not dramatically different.  Patient denied headaches, blurry vision, syncope or seizures.  Denies any fevers, chills or sweats.  Denied chest pain, palpitation, orthopnea or leg edema.  Denied cough, wheezing or hemoptysis.  Denied nausea, vomiting or abdominal pain.  Denies any constipation or diarrhea.  Denies any frequency urgency or hesitancy.  Denies any arthralgias or  myalgias.  Denies any skin rashes or lesions.  Denies any bleeding or clotting tendency.  Denies any easy bruising.  Denies any hair or nail changes.  Denies any anxiety or depression.  Remaining review of system is negative.                     Medications: No changes in the medication upon my review today. Current Outpatient Prescriptions  Medication Sig Dispense Refill  . acetaminophen (TYLENOL) 500 MG tablet Take 500 mg by mouth every 6 (six) hours as needed for pain.      . diphenhydrAMINE (BENADRYL) 25 mg capsule Take 25-50 mg by mouth daily as needed for itching. For allergic reaction      . lidocaine-prilocaine (EMLA) cream Apply 1 application topically as needed. Apply approx 1/2 tsp to skin over port, prior to chemotherapy treatments  1 kit  3  . Multiple Vitamin (MULTIVITAMIN) tablet Take 1 tablet by mouth daily.      . ondansetron (ZOFRAN) 8 MG tablet Take 1 tablet (8 mg total) by mouth every 8 (eight) hours as needed for nausea.  30 tablet  1   No current facility-administered medications for this visit.     Allergies: No Known Allergies  Past Medical History, Surgical history, Social history, and Family History no change noted by my review.    Physical Exam:  Blood pressure 131/89, pulse 85, temperature 97.8 F (36.6 C), temperature source Oral, resp. rate 18, height 5' 4" (1.626 m), weight 153 lb 8 oz (69.6 kg), SpO2 100 %.  ECOG: 0   General appearance: Alert, awake without any distress. Head: Atraumatic without abnormalities Oropharynx: Without any thrush or ulcers. Eyes: No scleral icterus. Lymph nodes: No lymphadenopathy noted in the cervical, supraclavicular, or axillary nodes Heart:regular rate and rhythm, without any murmurs or gallops.   Lung: Clear to auscultation without any rhonchi, wheezes or dullness to percussion. Abdomin: Soft, nontender without any shifting dullness or ascites. Musculoskeletal: No clubbing or  cyanosis. Neurological: No motor or sensory deficits. Skin: No rashes or lesions.       CBC    Component Value Date/Time   WBC 10.9 (H) 01/04/2019 0821   WBC 4.2 09/08/2017 0834   RBC 3.64 (L) 01/04/2019 0821   HGB 11.8 (L) 01/04/2019 0821   HGB 12.3 05/26/2017 0809   HCT 36.8 01/04/2019 0821   HCT 37.6 05/26/2017 0809   PLT 301 01/04/2019 0821   PLT 180 05/26/2017 0809   MCV 101.1 (H) 01/04/2019 0821   MCV 100.3 05/26/2017 0809   MCH 32.4 01/04/2019 0821   MCHC 32.1 01/04/2019 0821   RDW 17.4 (H) 01/04/2019 0821   RDW 17.4 (H) 05/26/2017 0809   LYMPHSABS 1.8 01/04/2019 0821   LYMPHSABS 1.2 05/26/2017 0809   MONOABS 1.0 01/04/2019 0821   MONOABS 0.5 05/26/2017 0809   EOSABS 0.2 01/04/2019 0821   EOSABS 0.1 05/26/2017 0809   BASOSABS 0.1 01/04/2019 0821   BASOSABS 0.0 05/26/2017 0809       Impression and Plan:  68 year old woman with:   1.  Stage IV colon cancer with hepatic and peritoneal metastasis noted in 2014.   She is currently on FOLFIRI and Avastin without any major complications.  Risks and benefits of continuing this approach for the time being was discussed.  Alternative options would be maintenance 5-FU leucovorin with a Avastin.  For the time being we will continue with the same dose and schedule and proceed with maintenance 5-FU leucovorin and Avastin in the future.  Anticipate starting that in August 2020.    2.  IV access: Port-A-Cath remains in use without any issues..   3. Neuropathy: Not changed at this time with additional chemotherapy.  4.  Abdominal discomfort: No recent exacerbation or worsening disease at this time.  This is related to her malignancy.  5.  Anxiety: No worsening anxiety or depression noted at this time.  6.  Prognosis: Her disease remains incurable however aggressive therapy is warranted given excellent performance status.  7.  Antiemetics: No issues reported with with worsening vomiting.  Antiemetics are available to  her.  8.  Neutropenia: She will receive growth factor support after each cycle of therapy.  9. Followup: In 2 weeks for evaluation prior to her next cycle of treatment.  25 minutes spent today face-to-face with the patient.  More than 50% of today's visit was spent on reviewing laboratory data, disease status update, treatment options answering questions regarding future plan of care.   Zola Button MD 01/04/19

## 2019-01-06 ENCOUNTER — Other Ambulatory Visit: Payer: Self-pay

## 2019-01-06 ENCOUNTER — Inpatient Hospital Stay: Payer: Medicare Other

## 2019-01-06 VITALS — BP 128/78 | HR 78 | Temp 98.2°F | Resp 18

## 2019-01-06 DIAGNOSIS — Z5112 Encounter for antineoplastic immunotherapy: Secondary | ICD-10-CM | POA: Diagnosis not present

## 2019-01-06 DIAGNOSIS — C189 Malignant neoplasm of colon, unspecified: Secondary | ICD-10-CM | POA: Diagnosis not present

## 2019-01-06 DIAGNOSIS — Z7689 Persons encountering health services in other specified circumstances: Secondary | ICD-10-CM | POA: Diagnosis not present

## 2019-01-06 DIAGNOSIS — C786 Secondary malignant neoplasm of retroperitoneum and peritoneum: Secondary | ICD-10-CM | POA: Diagnosis not present

## 2019-01-06 DIAGNOSIS — C787 Secondary malignant neoplasm of liver and intrahepatic bile duct: Secondary | ICD-10-CM | POA: Diagnosis not present

## 2019-01-06 DIAGNOSIS — K769 Liver disease, unspecified: Secondary | ICD-10-CM

## 2019-01-06 DIAGNOSIS — K6389 Other specified diseases of intestine: Secondary | ICD-10-CM

## 2019-01-06 DIAGNOSIS — Z5111 Encounter for antineoplastic chemotherapy: Secondary | ICD-10-CM | POA: Diagnosis not present

## 2019-01-06 MED ORDER — SODIUM CHLORIDE 0.9% FLUSH
10.0000 mL | INTRAVENOUS | Status: DC | PRN
  Administered 2019-01-06: 11:00:00 10 mL
  Filled 2019-01-06: qty 10

## 2019-01-06 MED ORDER — HEPARIN SOD (PORK) LOCK FLUSH 100 UNIT/ML IV SOLN
500.0000 [IU] | Freq: Once | INTRAVENOUS | Status: AC | PRN
  Administered 2019-01-06: 500 [IU]
  Filled 2019-01-06: qty 5

## 2019-01-06 MED ORDER — PEGFILGRASTIM-CBQV 6 MG/0.6ML ~~LOC~~ SOSY
6.0000 mg | PREFILLED_SYRINGE | Freq: Once | SUBCUTANEOUS | Status: AC
  Administered 2019-01-06: 6 mg via SUBCUTANEOUS

## 2019-01-06 MED ORDER — PEGFILGRASTIM-CBQV 6 MG/0.6ML ~~LOC~~ SOSY
PREFILLED_SYRINGE | SUBCUTANEOUS | Status: AC
  Filled 2019-01-06: qty 0.6

## 2019-01-06 NOTE — Patient Instructions (Signed)
Pegfilgrastim injection  What is this medicine?  PEGFILGRASTIM (PEG fil gra stim) is a long-acting granulocyte colony-stimulating factor that stimulates the growth of neutrophils, a type of white blood cell important in the body's fight against infection. It is used to reduce the incidence of fever and infection in patients with certain types of cancer who are receiving chemotherapy that affects the bone marrow, and to increase survival after being exposed to high doses of radiation.  This medicine may be used for other purposes; ask your health care provider or pharmacist if you have questions.  COMMON BRAND NAME(S): Fulphila, Neulasta, UDENYCA  What should I tell my health care provider before I take this medicine?  They need to know if you have any of these conditions:  -kidney disease  -latex allergy  -ongoing radiation therapy  -sickle cell disease  -skin reactions to acrylic adhesives (On-Body Injector only)  -an unusual or allergic reaction to pegfilgrastim, filgrastim, other medicines, foods, dyes, or preservatives  -pregnant or trying to get pregnant  -breast-feeding  How should I use this medicine?  This medicine is for injection under the skin. If you get this medicine at home, you will be taught how to prepare and give the pre-filled syringe or how to use the On-body Injector. Refer to the patient Instructions for Use for detailed instructions. Use exactly as directed. Tell your healthcare provider immediately if you suspect that the On-body Injector may not have performed as intended or if you suspect the use of the On-body Injector resulted in a missed or partial dose.  It is important that you put your used needles and syringes in a special sharps container. Do not put them in a trash can. If you do not have a sharps container, call your pharmacist or healthcare provider to get one.  Talk to your pediatrician regarding the use of this medicine in children. While this drug may be prescribed for  selected conditions, precautions do apply.  Overdosage: If you think you have taken too much of this medicine contact a poison control center or emergency room at once.  NOTE: This medicine is only for you. Do not share this medicine with others.  What if I miss a dose?  It is important not to miss your dose. Call your doctor or health care professional if you miss your dose. If you miss a dose due to an On-body Injector failure or leakage, a new dose should be administered as soon as possible using a single prefilled syringe for manual use.  What may interact with this medicine?  Interactions have not been studied.  Give your health care provider a list of all the medicines, herbs, non-prescription drugs, or dietary supplements you use. Also tell them if you smoke, drink alcohol, or use illegal drugs. Some items may interact with your medicine.  This list may not describe all possible interactions. Give your health care provider a list of all the medicines, herbs, non-prescription drugs, or dietary supplements you use. Also tell them if you smoke, drink alcohol, or use illegal drugs. Some items may interact with your medicine.  What should I watch for while using this medicine?  You may need blood work done while you are taking this medicine.  If you are going to need a MRI, CT scan, or other procedure, tell your doctor that you are using this medicine (On-Body Injector only).  What side effects may I notice from receiving this medicine?  Side effects that you should report to   your doctor or health care professional as soon as possible:  -allergic reactions like skin rash, itching or hives, swelling of the face, lips, or tongue  -back pain  -dizziness  -fever  -pain, redness, or irritation at site where injected  -pinpoint red spots on the skin  -red or dark-brown urine  -shortness of breath or breathing problems  -stomach or side pain, or pain at the shoulder  -swelling  -tiredness  -trouble passing urine or  change in the amount of urine  Side effects that usually do not require medical attention (report to your doctor or health care professional if they continue or are bothersome):  -bone pain  -muscle pain  This list may not describe all possible side effects. Call your doctor for medical advice about side effects. You may report side effects to FDA at 1-800-FDA-1088.  Where should I keep my medicine?  Keep out of the reach of children.  If you are using this medicine at home, you will be instructed on how to store it. Throw away any unused medicine after the expiration date on the label.  NOTE: This sheet is a summary. It may not cover all possible information. If you have questions about this medicine, talk to your doctor, pharmacist, or health care provider.   2019 Elsevier/Gold Standard (2017-09-13 16:57:08)

## 2019-01-18 ENCOUNTER — Inpatient Hospital Stay: Payer: Medicare Other

## 2019-01-18 ENCOUNTER — Encounter: Payer: Self-pay | Admitting: Oncology

## 2019-01-18 ENCOUNTER — Inpatient Hospital Stay (HOSPITAL_BASED_OUTPATIENT_CLINIC_OR_DEPARTMENT_OTHER): Payer: Medicare Other | Admitting: Oncology

## 2019-01-18 ENCOUNTER — Telehealth: Payer: Self-pay | Admitting: Oncology

## 2019-01-18 ENCOUNTER — Other Ambulatory Visit: Payer: Self-pay

## 2019-01-18 VITALS — BP 132/82 | HR 82 | Temp 97.2°F | Resp 18 | Wt 155.2 lb

## 2019-01-18 VITALS — BP 122/81 | HR 71

## 2019-01-18 DIAGNOSIS — R110 Nausea: Secondary | ICD-10-CM

## 2019-01-18 DIAGNOSIS — G620 Drug-induced polyneuropathy: Secondary | ICD-10-CM

## 2019-01-18 DIAGNOSIS — C786 Secondary malignant neoplasm of retroperitoneum and peritoneum: Secondary | ICD-10-CM

## 2019-01-18 DIAGNOSIS — Z7689 Persons encountering health services in other specified circumstances: Secondary | ICD-10-CM | POA: Diagnosis not present

## 2019-01-18 DIAGNOSIS — D701 Agranulocytosis secondary to cancer chemotherapy: Secondary | ICD-10-CM | POA: Diagnosis not present

## 2019-01-18 DIAGNOSIS — R109 Unspecified abdominal pain: Secondary | ICD-10-CM

## 2019-01-18 DIAGNOSIS — T451X5S Adverse effect of antineoplastic and immunosuppressive drugs, sequela: Secondary | ICD-10-CM | POA: Diagnosis not present

## 2019-01-18 DIAGNOSIS — C189 Malignant neoplasm of colon, unspecified: Secondary | ICD-10-CM

## 2019-01-18 DIAGNOSIS — C787 Secondary malignant neoplasm of liver and intrahepatic bile duct: Secondary | ICD-10-CM

## 2019-01-18 DIAGNOSIS — K6389 Other specified diseases of intestine: Secondary | ICD-10-CM

## 2019-01-18 DIAGNOSIS — Z79899 Other long term (current) drug therapy: Secondary | ICD-10-CM | POA: Diagnosis not present

## 2019-01-18 DIAGNOSIS — Z5111 Encounter for antineoplastic chemotherapy: Secondary | ICD-10-CM | POA: Diagnosis not present

## 2019-01-18 DIAGNOSIS — Z5112 Encounter for antineoplastic immunotherapy: Secondary | ICD-10-CM | POA: Diagnosis not present

## 2019-01-18 DIAGNOSIS — F418 Other specified anxiety disorders: Secondary | ICD-10-CM | POA: Diagnosis not present

## 2019-01-18 DIAGNOSIS — G893 Neoplasm related pain (acute) (chronic): Secondary | ICD-10-CM | POA: Diagnosis not present

## 2019-01-18 DIAGNOSIS — K769 Liver disease, unspecified: Secondary | ICD-10-CM

## 2019-01-18 DIAGNOSIS — R5383 Other fatigue: Secondary | ICD-10-CM

## 2019-01-18 DIAGNOSIS — Z95828 Presence of other vascular implants and grafts: Secondary | ICD-10-CM

## 2019-01-18 LAB — CMP (CANCER CENTER ONLY)
ALT: 10 U/L (ref 0–44)
AST: 15 U/L (ref 15–41)
Albumin: 3.3 g/dL — ABNORMAL LOW (ref 3.5–5.0)
Alkaline Phosphatase: 127 U/L — ABNORMAL HIGH (ref 38–126)
Anion gap: 9 (ref 5–15)
BUN: 21 mg/dL (ref 8–23)
CO2: 23 mmol/L (ref 22–32)
Calcium: 9 mg/dL (ref 8.9–10.3)
Chloride: 108 mmol/L (ref 98–111)
Creatinine: 1.03 mg/dL — ABNORMAL HIGH (ref 0.44–1.00)
GFR, Est AFR Am: >60 mL/min (ref >60)
GFR, Estimated: 56 mL/min — ABNORMAL LOW (ref >60)
Glucose, Bld: 138 mg/dL — ABNORMAL HIGH (ref 70–99)
Potassium: 3.2 mmol/L — ABNORMAL LOW (ref 3.5–5.1)
Sodium: 140 mmol/L (ref 135–145)
Total Bilirubin: 0.5 mg/dL (ref 0.3–1.2)
Total Protein: 6.8 g/dL (ref 6.5–8.1)

## 2019-01-18 LAB — CBC WITH DIFFERENTIAL (CANCER CENTER ONLY)
Abs Immature Granulocytes: 0.2 10*3/uL — ABNORMAL HIGH (ref 0.00–0.07)
Basophils Absolute: 0.1 10*3/uL (ref 0.0–0.1)
Basophils Relative: 1 %
Eosinophils Absolute: 0.1 10*3/uL (ref 0.0–0.5)
Eosinophils Relative: 1 %
HCT: 34.5 % — ABNORMAL LOW (ref 36.0–46.0)
Hemoglobin: 11.1 g/dL — ABNORMAL LOW (ref 12.0–15.0)
Immature Granulocytes: 2 %
Lymphocytes Relative: 14 %
Lymphs Abs: 1.7 10*3/uL (ref 0.7–4.0)
MCH: 32.2 pg (ref 26.0–34.0)
MCHC: 32.2 g/dL (ref 30.0–36.0)
MCV: 100 fL (ref 80.0–100.0)
Monocytes Absolute: 0.7 10*3/uL (ref 0.1–1.0)
Monocytes Relative: 6 %
Neutro Abs: 9 10*3/uL — ABNORMAL HIGH (ref 1.7–7.7)
Neutrophils Relative %: 76 %
Platelet Count: 200 10*3/uL (ref 150–400)
RBC: 3.45 MIL/uL — ABNORMAL LOW (ref 3.87–5.11)
RDW: 16.8 % — ABNORMAL HIGH (ref 11.5–15.5)
WBC Count: 11.8 10*3/uL — ABNORMAL HIGH (ref 4.0–10.5)
nRBC: 0 % (ref 0.0–0.2)

## 2019-01-18 LAB — TOTAL PROTEIN, URINE DIPSTICK: Protein, ur: NEGATIVE mg/dL

## 2019-01-18 LAB — CEA (IN HOUSE-CHCC): CEA (CHCC-In House): 15.93 ng/mL — ABNORMAL HIGH (ref 0.00–5.00)

## 2019-01-18 MED ORDER — PALONOSETRON HCL INJECTION 0.25 MG/5ML
INTRAVENOUS | Status: AC
  Filled 2019-01-18: qty 5

## 2019-01-18 MED ORDER — LEUCOVORIN CALCIUM INJECTION 350 MG
400.0000 mg/m2 | Freq: Once | INTRAVENOUS | Status: AC
  Administered 2019-01-18: 712 mg via INTRAVENOUS
  Filled 2019-01-18: qty 35.6

## 2019-01-18 MED ORDER — HYDROCODONE-ACETAMINOPHEN 5-325 MG PO TABS: 1.0000 | ORAL_TABLET | ORAL | 0 refills | Status: DC | PRN

## 2019-01-18 MED ORDER — DEXAMETHASONE SODIUM PHOSPHATE 10 MG/ML IJ SOLN
INTRAMUSCULAR | Status: AC
  Filled 2019-01-18: qty 1

## 2019-01-18 MED ORDER — SODIUM CHLORIDE 0.9% FLUSH
10.0000 mL | Freq: Once | INTRAVENOUS | Status: AC
  Administered 2019-01-18: 10 mL
  Filled 2019-01-18: qty 10

## 2019-01-18 MED ORDER — PALONOSETRON HCL INJECTION 0.25 MG/5ML
0.2500 mg | Freq: Once | INTRAVENOUS | Status: AC
  Administered 2019-01-18: 0.25 mg via INTRAVENOUS

## 2019-01-18 MED ORDER — SODIUM CHLORIDE 0.9% FLUSH
10.0000 mL | INTRAVENOUS | Status: DC | PRN
  Filled 2019-01-18: qty 10

## 2019-01-18 MED ORDER — ATROPINE SULFATE 1 MG/ML IJ SOLN: 0.5000 mg | Freq: Once | INTRAMUSCULAR | Status: DC | PRN

## 2019-01-18 MED ORDER — ATROPINE SULFATE 1 MG/ML IJ SOLN: 0.4000 mg | Freq: Once | INTRAMUSCULAR | Status: DC

## 2019-01-18 MED ORDER — SODIUM CHLORIDE 0.9 % IV SOLN
5.0000 mg/kg | Freq: Once | INTRAVENOUS | Status: AC
  Administered 2019-01-18: 350 mg via INTRAVENOUS
  Filled 2019-01-18: qty 14

## 2019-01-18 MED ORDER — DEXAMETHASONE SODIUM PHOSPHATE 10 MG/ML IJ SOLN
10.0000 mg | Freq: Once | INTRAMUSCULAR | Status: AC
  Administered 2019-01-18: 10 mg via INTRAVENOUS

## 2019-01-18 MED ORDER — SODIUM CHLORIDE 0.9 % IV SOLN
Freq: Once | INTRAVENOUS | Status: AC
  Administered 2019-01-18: 09:00:00 via INTRAVENOUS
  Filled 2019-01-18: qty 250

## 2019-01-18 MED ORDER — ATROPINE SULFATE 0.4 MG/ML IJ SOLN
INTRAMUSCULAR | Status: AC
  Filled 2019-01-18: qty 1

## 2019-01-18 MED ORDER — ATROPINE SULFATE 0.4 MG/ML IJ SOLN
0.4000 mg | Freq: Once | INTRAMUSCULAR | Status: AC | PRN
  Administered 2019-01-18: 0.4 mg via INTRAVENOUS

## 2019-01-18 MED ORDER — HEPARIN SOD (PORK) LOCK FLUSH 100 UNIT/ML IV SOLN
500.0000 [IU] | Freq: Once | INTRAVENOUS | Status: DC | PRN
  Filled 2019-01-18: qty 5

## 2019-01-18 MED ORDER — IRINOTECAN HCL CHEMO INJECTION 100 MG/5ML
180.0000 mg/m2 | Freq: Once | INTRAVENOUS | Status: AC
  Administered 2019-01-18: 320 mg via INTRAVENOUS
  Filled 2019-01-18: qty 5

## 2019-01-18 MED ORDER — SODIUM CHLORIDE 0.9 % IV SOLN
2400.0000 mg/m2 | INTRAVENOUS | Status: DC
  Administered 2019-01-18: 4250 mg via INTRAVENOUS
  Filled 2019-01-18: qty 85

## 2019-01-18 MED ORDER — FLUOROURACIL CHEMO INJECTION 2.5 GM/50ML
400.0000 mg/m2 | Freq: Once | INTRAVENOUS | Status: AC
  Administered 2019-01-18: 700 mg via INTRAVENOUS
  Filled 2019-01-18: qty 14

## 2019-01-18 NOTE — Telephone Encounter (Signed)
Scheduled per sch msg

## 2019-01-18 NOTE — Progress Notes (Signed)
Hematology and Oncology Follow Up Visit  Margaret Elliott [id number] [birth year]    01/18/19   Principle Diagnosis: 68 year old woman with stage IV colon cancer diagnosed in 2014. Her tumor is tumor that is microsatellite stable,  NRAS mutated based on a biopsy obtained on 2017.  She has a peritoneal involvement in addition to liver disease.   Prior Therapy:  She is status post laparoscopic laparotomy right hemicolectomy with ileocolonic anastomosis done on December 09, 2012.   FOLFOX and a Avastin chemotherapy started 01/18/2013. She is S/P 12 cycles completed in 05/2013.   She is a status post microwave ablation of metastatic lesion in the posterior segment of the right lobe of the liver completed on 09/28/2014 and repeated on 11/30/2014.  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  FOLFIRI and a Avastin salvage therapy started on 12/10/2015.  Therapy discontinued and maintained on 5-FU leucovorin with a Avastin only till March 2020.  Current therapy:     FOLFIRI and a Avastin restarted on 10/05/2018.   She is here for cycle 9 of therapy.   Interim History:  Margaret Elliott presents for a follow-up.  Since the last evaluation, she reports no major changes in her health.  She tolerated the last cycle of chemotherapy without any major concerns.  She did have some fatigue and nausea but no vomiting or abdominal pain.  The symptoms last 2 to 3 days and resolved subsequently.  He is able to eat well and maintain reasonable weight and adequate quality of life.  She denies any recent hospitalization or illnesses.  She denied any alteration mental status, neuropathy, confusion or dizziness.  Denies any headaches or lethargy.  Denies any night sweats, weight loss or changes in appetite.  Denied orthopnea, dyspnea on exertion or chest discomfort.  Denies shortness of breath, difficulty breathing hemoptysis or cough.  Denies any  abdominal distention, nausea, early satiety or dyspepsia.  Denies any hematuria, frequency, dysuria or nocturia.  Denies any skin irritation, dryness or rash.  Denies any ecchymosis or petechiae.  Denies any lymphadenopathy or clotting.  Denies any heat or cold intolerance.  Denies any anxiety or depression.  Remaining review of system is negative.                         Medications: Updated today without any changes. Current Outpatient Prescriptions  Medication Sig Dispense Refill  . acetaminophen (TYLENOL) 500 MG tablet Take 500 mg by mouth every 6 (six) hours as needed for pain.      . diphenhydrAMINE (BENADRYL) 25 mg capsule Take 25-50 mg by mouth daily as needed for itching. For allergic reaction      . lidocaine-prilocaine (EMLA) cream Apply 1 application topically as needed. Apply approx 1/2 tsp to skin over port, prior to chemotherapy treatments  1 kit  3  . Multiple Vitamin (MULTIVITAMIN) tablet Take 1 tablet by mouth daily.      . ondansetron (ZOFRAN) 8 MG tablet Take 1 tablet (8 mg total) by mouth every 8 (eight) hours as needed for nausea.  30 tablet  1   No current facility-administered medications for this visit.     Allergies: No Known Allergies  Past Medical History, Surgical history, Social history, and Family History without any changes on review today.    Physical Exam:  Blood pressure 132/82, pulse 82, temperature (!) 97.2 F (36.2 C), temperature source Temporal, resp.  rate 18, weight 155 lb 3.2 oz (70.4 kg), SpO2 100 %.    ECOG: 0     General appearance: Comfortable appearing without any discomfort Head: Normocephalic without any trauma Oropharynx: Mucous membranes are moist and pink without any thrush or ulcers. Eyes: Pupils are equal and round reactive to light. Lymph nodes: No cervical, supraclavicular, inguinal or axillary lymphadenopathy.   Heart:regular rate and rhythm.  S1 and S2 without leg edema. Lung: Clear without any  rhonchi or wheezes.  No dullness to percussion. Abdomin: Soft, nontender, nondistended with good bowel sounds.  No hepatosplenomegaly. Musculoskeletal: No joint deformity or effusion.  Full range of motion noted. Neurological: No deficits noted on motor, sensory and deep tendon reflex exam. Skin: No petechial rash or dryness.  Appeared moist.  Psychiatric: Mood and affect appeared appropriate.        CBC    Component Value Date/Time   WBC 11.8 (H) 01/18/2019 0826   WBC 4.2 09/08/2017 0834   RBC 3.45 (L) 01/18/2019 0826   HGB 11.1 (L) 01/18/2019 0826   HGB 12.3 05/26/2017 0809   HCT 34.5 (L) 01/18/2019 0826   HCT 37.6 05/26/2017 0809   PLT 200 01/18/2019 0826   PLT 180 05/26/2017 0809   MCV 100.0 01/18/2019 0826   MCV 100.3 05/26/2017 0809   MCH 32.2 01/18/2019 0826   MCHC 32.2 01/18/2019 0826   RDW 16.8 (H) 01/18/2019 0826   RDW 17.4 (H) 05/26/2017 0809   LYMPHSABS 1.7 01/18/2019 0826   LYMPHSABS 1.2 05/26/2017 0809   MONOABS 0.7 01/18/2019 0826   MONOABS 0.5 05/26/2017 0809   EOSABS 0.1 01/18/2019 0826   EOSABS 0.1 05/26/2017 0809   BASOSABS 0.1 01/18/2019 0826   BASOSABS 0.0 05/26/2017 0809   Results for TRACE, CEDERBERG (MRN [id number]) as of 01/18/2019 08:46  Ref. Range 12/14/2018 08:24 01/04/2019 08:21  CEA ([facility]) Latest Ref Range: 0.00 - 5.00 ng/mL 35.26 (H) 23.99 (H)      Impression and Plan:  68 year old woman with:   1.  Colon cancer diagnosed in 2014.  She was found to have stage IV disease with peritoneal involvement.   She continues to tolerate FOLFIRI and Avastin with few side effects including nausea and fatigue.  Overall her disease has been manageable with reasonable clinical and radiographic response.  Her CEA continues to drop with CT scan in June 2020 showed improvement in her disease.  Risks and benefits of continuing this approach versus switching to maintenance 5-FU and leucovorin was discussed.  The plan to consider that in August  2020.  We will repeat imaging studies in 3 to 6 months    2.  IV access: Port-A-Cath without any issues remains in use.   3. Neuropathy: Related to chemotherapy exposure without any changes.  4.  Abdominal discomfort: Related to malignancy and uses hydrocodone effectively.  This will be refilled for her.  5.  Anxiety: Manageable at this time with appropriate mood and affect.  6.  Prognosis: Therapy remains palliative although her disease is incurable she remains in excellent performance status.  7.  Antiemetics: Manageable at this time with the current antiemetics.  8.  Neutropenia: Related to chemotherapy and will receive growth factor support for each treatment.  9. Followup: Will be in 2 weeks for the second cycle of therapy.  25 minutes spent today face-to-face with the patient.  More than 50% of today's visit was dedicated to updating her disease status, treatment options and managing complications related therapy.  Zola Button MD 01/18/19

## 2019-01-18 NOTE — Patient Instructions (Signed)
[facility] Discharge Instructions for Patients Receiving Chemotherapy  Today you received the following chemotherapy agents :  Avastin, Irinotecan, Leucovorin, Fluorouracil.  To help prevent nausea and vomiting after your treatment, we encourage you to take your nausea medication as prescribed.   If you develop nausea and vomiting that is not controlled by your nausea medication, call the clinic.   BELOW ARE SYMPTOMS THAT SHOULD BE REPORTED IMMEDIATELY:  *FEVER GREATER THAN 100.5 F  *CHILLS WITH OR WITHOUT FEVER  NAUSEA AND VOMITING THAT IS NOT CONTROLLED WITH YOUR NAUSEA MEDICATION  *UNUSUAL SHORTNESS OF BREATH  *UNUSUAL BRUISING OR BLEEDING  TENDERNESS IN MOUTH AND THROAT WITH OR WITHOUT PRESENCE OF ULCERS  *URINARY PROBLEMS  *BOWEL PROBLEMS  UNUSUAL RASH Items with * indicate a potential emergency and should be followed up as soon as possible.  Feel free to call the clinic should you have any questions or concerns. The clinic phone number is [phone].  Please show the [place] at check-in to the Emergency Department and triage nurse.

## 2019-01-20 ENCOUNTER — Other Ambulatory Visit: Payer: Self-pay

## 2019-01-20 ENCOUNTER — Inpatient Hospital Stay: Payer: Medicare Other

## 2019-01-20 VITALS — BP 128/78 | HR 72 | Temp 98.2°F | Resp 18

## 2019-01-20 DIAGNOSIS — K769 Liver disease, unspecified: Secondary | ICD-10-CM

## 2019-01-20 DIAGNOSIS — C189 Malignant neoplasm of colon, unspecified: Secondary | ICD-10-CM | POA: Diagnosis not present

## 2019-01-20 DIAGNOSIS — Z5111 Encounter for antineoplastic chemotherapy: Secondary | ICD-10-CM | POA: Diagnosis not present

## 2019-01-20 DIAGNOSIS — K6389 Other specified diseases of intestine: Secondary | ICD-10-CM

## 2019-01-20 DIAGNOSIS — C787 Secondary malignant neoplasm of liver and intrahepatic bile duct: Secondary | ICD-10-CM | POA: Diagnosis not present

## 2019-01-20 DIAGNOSIS — Z5112 Encounter for antineoplastic immunotherapy: Secondary | ICD-10-CM | POA: Diagnosis not present

## 2019-01-20 DIAGNOSIS — C786 Secondary malignant neoplasm of retroperitoneum and peritoneum: Secondary | ICD-10-CM | POA: Diagnosis not present

## 2019-01-20 DIAGNOSIS — Z7689 Persons encountering health services in other specified circumstances: Secondary | ICD-10-CM | POA: Diagnosis not present

## 2019-01-20 MED ORDER — HEPARIN SOD (PORK) LOCK FLUSH 100 UNIT/ML IV SOLN
500.0000 [IU] | Freq: Once | INTRAVENOUS | Status: AC | PRN
  Administered 2019-01-20: 500 [IU]
  Filled 2019-01-20: qty 5

## 2019-01-20 MED ORDER — PEGFILGRASTIM-CBQV 6 MG/0.6ML ~~LOC~~ SOSY
6.0000 mg | PREFILLED_SYRINGE | Freq: Once | SUBCUTANEOUS | Status: AC
  Administered 2019-01-20: 11:00:00 6 mg via SUBCUTANEOUS

## 2019-01-20 MED ORDER — PEGFILGRASTIM-CBQV 6 MG/0.6ML ~~LOC~~ SOSY
PREFILLED_SYRINGE | SUBCUTANEOUS | Status: AC
  Filled 2019-01-20: qty 0.6

## 2019-01-20 MED ORDER — SODIUM CHLORIDE 0.9% FLUSH
10.0000 mL | INTRAVENOUS | Status: DC | PRN
  Administered 2019-01-20: 10 mL
  Filled 2019-01-20: qty 10

## 2019-01-20 NOTE — Patient Instructions (Signed)
Pegfilgrastim injection  What is this medicine?  PEGFILGRASTIM (PEG fil gra stim) is a long-acting granulocyte colony-stimulating factor that stimulates the growth of neutrophils, a type of white blood cell important in the body's fight against infection. It is used to reduce the incidence of fever and infection in patients with certain types of cancer who are receiving chemotherapy that affects the bone marrow, and to increase survival after being exposed to high doses of radiation.  This medicine may be used for other purposes; ask your health care provider or pharmacist if you have questions.  COMMON BRAND NAME(S): Fulphila, Neulasta, UDENYCA  What should I tell my health care provider before I take this medicine?  They need to know if you have any of these conditions:  -kidney disease  -latex allergy  -ongoing radiation therapy  -sickle cell disease  -skin reactions to acrylic adhesives (On-Body Injector only)  -an unusual or allergic reaction to pegfilgrastim, filgrastim, other medicines, foods, dyes, or preservatives  -pregnant or trying to get pregnant  -breast-feeding  How should I use this medicine?  This medicine is for injection under the skin. If you get this medicine at home, you will be taught how to prepare and give the pre-filled syringe or how to use the On-body Injector. Refer to the patient Instructions for Use for detailed instructions. Use exactly as directed. Tell your healthcare provider immediately if you suspect that the On-body Injector may not have performed as intended or if you suspect the use of the On-body Injector resulted in a missed or partial dose.  It is important that you put your used needles and syringes in a special sharps container. Do not put them in a trash can. If you do not have a sharps container, call your pharmacist or healthcare provider to get one.  Talk to your pediatrician regarding the use of this medicine in children. While this drug may be prescribed for  selected conditions, precautions do apply.  Overdosage: If you think you have taken too much of this medicine contact a poison control center or emergency room at once.  NOTE: This medicine is only for you. Do not share this medicine with others.  What if I miss a dose?  It is important not to miss your dose. Call your doctor or health care professional if you miss your dose. If you miss a dose due to an On-body Injector failure or leakage, a new dose should be administered as soon as possible using a single prefilled syringe for manual use.  What may interact with this medicine?  Interactions have not been studied.  Give your health care provider a list of all the medicines, herbs, non-prescription drugs, or dietary supplements you use. Also tell them if you smoke, drink alcohol, or use illegal drugs. Some items may interact with your medicine.  This list may not describe all possible interactions. Give your health care provider a list of all the medicines, herbs, non-prescription drugs, or dietary supplements you use. Also tell them if you smoke, drink alcohol, or use illegal drugs. Some items may interact with your medicine.  What should I watch for while using this medicine?  You may need blood work done while you are taking this medicine.  If you are going to need a MRI, CT scan, or other procedure, tell your doctor that you are using this medicine (On-Body Injector only).  What side effects may I notice from receiving this medicine?  Side effects that you should report to   your doctor or health care professional as soon as possible:  -allergic reactions like skin rash, itching or hives, swelling of the face, lips, or tongue  -back pain  -dizziness  -fever  -pain, redness, or irritation at site where injected  -pinpoint red spots on the skin  -red or dark-brown urine  -shortness of breath or breathing problems  -stomach or side pain, or pain at the shoulder  -swelling  -tiredness  -trouble passing urine or  change in the amount of urine  Side effects that usually do not require medical attention (report to your doctor or health care professional if they continue or are bothersome):  -bone pain  -muscle pain  This list may not describe all possible side effects. Call your doctor for medical advice about side effects. You may report side effects to FDA at 1-800-FDA-1088.  Where should I keep my medicine?  Keep out of the reach of children.  If you are using this medicine at home, you will be instructed on how to store it. Throw away any unused medicine after the expiration date on the label.  NOTE: This sheet is a summary. It may not cover all possible information. If you have questions about this medicine, talk to your doctor, pharmacist, or health care provider.   2019 Elsevier/Gold Standard (2017-09-13 16:57:08)

## 2019-02-01 ENCOUNTER — Inpatient Hospital Stay: Payer: Medicare Other

## 2019-02-01 ENCOUNTER — Inpatient Hospital Stay: Payer: Medicare Other | Attending: Oncology | Admitting: Oncology

## 2019-02-01 ENCOUNTER — Other Ambulatory Visit: Payer: Self-pay

## 2019-02-01 VITALS — BP 133/84 | HR 89 | Temp 97.6°F | Resp 18 | Ht 64.0 in | Wt 154.3 lb

## 2019-02-01 VITALS — BP 135/90 | HR 76

## 2019-02-01 DIAGNOSIS — C786 Secondary malignant neoplasm of retroperitoneum and peritoneum: Secondary | ICD-10-CM | POA: Diagnosis not present

## 2019-02-01 DIAGNOSIS — C189 Malignant neoplasm of colon, unspecified: Secondary | ICD-10-CM

## 2019-02-01 DIAGNOSIS — Z95828 Presence of other vascular implants and grafts: Secondary | ICD-10-CM | POA: Insufficient documentation

## 2019-02-01 DIAGNOSIS — R109 Unspecified abdominal pain: Secondary | ICD-10-CM | POA: Diagnosis not present

## 2019-02-01 DIAGNOSIS — R197 Diarrhea, unspecified: Secondary | ICD-10-CM | POA: Insufficient documentation

## 2019-02-01 DIAGNOSIS — R110 Nausea: Secondary | ICD-10-CM | POA: Diagnosis not present

## 2019-02-01 DIAGNOSIS — Z87891 Personal history of nicotine dependence: Secondary | ICD-10-CM | POA: Insufficient documentation

## 2019-02-01 DIAGNOSIS — Z79899 Other long term (current) drug therapy: Secondary | ICD-10-CM | POA: Insufficient documentation

## 2019-02-01 DIAGNOSIS — D701 Agranulocytosis secondary to cancer chemotherapy: Secondary | ICD-10-CM | POA: Insufficient documentation

## 2019-02-01 DIAGNOSIS — C787 Secondary malignant neoplasm of liver and intrahepatic bile duct: Secondary | ICD-10-CM

## 2019-02-01 DIAGNOSIS — K6389 Other specified diseases of intestine: Secondary | ICD-10-CM

## 2019-02-01 DIAGNOSIS — K769 Liver disease, unspecified: Secondary | ICD-10-CM

## 2019-02-01 DIAGNOSIS — R5383 Other fatigue: Secondary | ICD-10-CM | POA: Diagnosis not present

## 2019-02-01 DIAGNOSIS — G629 Polyneuropathy, unspecified: Secondary | ICD-10-CM | POA: Diagnosis not present

## 2019-02-01 DIAGNOSIS — Z5112 Encounter for antineoplastic immunotherapy: Secondary | ICD-10-CM | POA: Insufficient documentation

## 2019-02-01 DIAGNOSIS — C182 Malignant neoplasm of ascending colon: Secondary | ICD-10-CM | POA: Diagnosis not present

## 2019-02-01 DIAGNOSIS — T451X5A Adverse effect of antineoplastic and immunosuppressive drugs, initial encounter: Secondary | ICD-10-CM | POA: Diagnosis not present

## 2019-02-01 DIAGNOSIS — Z5111 Encounter for antineoplastic chemotherapy: Secondary | ICD-10-CM | POA: Diagnosis not present

## 2019-02-01 DIAGNOSIS — F419 Anxiety disorder, unspecified: Secondary | ICD-10-CM | POA: Insufficient documentation

## 2019-02-01 LAB — CMP (CANCER CENTER ONLY)
ALT: 12 U/L (ref 0–44)
AST: 16 U/L (ref 15–41)
Albumin: 3.4 g/dL — ABNORMAL LOW (ref 3.5–5.0)
Alkaline Phosphatase: 119 U/L (ref 38–126)
Anion gap: 10 (ref 5–15)
BUN: 17 mg/dL (ref 8–23)
CO2: 22 mmol/L (ref 22–32)
Calcium: 8.9 mg/dL (ref 8.9–10.3)
Chloride: 109 mmol/L (ref 98–111)
Creatinine: 1.09 mg/dL — ABNORMAL HIGH (ref 0.44–1.00)
GFR, Est AFR Am: 60 mL/min — ABNORMAL LOW (ref >60)
GFR, Estimated: 52 mL/min — ABNORMAL LOW (ref >60)
Glucose, Bld: 113 mg/dL — ABNORMAL HIGH (ref 70–99)
Potassium: 3.8 mmol/L (ref 3.5–5.1)
Sodium: 141 mmol/L (ref 135–145)
Total Bilirubin: 0.5 mg/dL (ref 0.3–1.2)
Total Protein: 6.6 g/dL (ref 6.5–8.1)

## 2019-02-01 LAB — CBC WITH DIFFERENTIAL (CANCER CENTER ONLY)
Abs Immature Granulocytes: 0.13 10*3/uL — ABNORMAL HIGH (ref 0.00–0.07)
Basophils Absolute: 0 10*3/uL (ref 0.0–0.1)
Basophils Relative: 1 %
Eosinophils Absolute: 0.1 10*3/uL (ref 0.0–0.5)
Eosinophils Relative: 2 %
HCT: 35.5 % — ABNORMAL LOW (ref 36.0–46.0)
Hemoglobin: 11.2 g/dL — ABNORMAL LOW (ref 12.0–15.0)
Immature Granulocytes: 2 %
Lymphocytes Relative: 20 %
Lymphs Abs: 1.5 10*3/uL (ref 0.7–4.0)
MCH: 32.1 pg (ref 26.0–34.0)
MCHC: 31.5 g/dL (ref 30.0–36.0)
MCV: 101.7 fL — ABNORMAL HIGH (ref 80.0–100.0)
Monocytes Absolute: 0.9 10*3/uL (ref 0.1–1.0)
Monocytes Relative: 11 %
Neutro Abs: 5.1 10*3/uL (ref 1.7–7.7)
Neutrophils Relative %: 64 %
Platelet Count: 210 10*3/uL (ref 150–400)
RBC: 3.49 MIL/uL — ABNORMAL LOW (ref 3.87–5.11)
RDW: 17 % — ABNORMAL HIGH (ref 11.5–15.5)
WBC Count: 7.8 10*3/uL (ref 4.0–10.5)
nRBC: 0 % (ref 0.0–0.2)

## 2019-02-01 LAB — CEA (IN HOUSE-CHCC): CEA (CHCC-In House): 13.46 ng/mL — ABNORMAL HIGH (ref 0.00–5.00)

## 2019-02-01 MED ORDER — DEXAMETHASONE SODIUM PHOSPHATE 10 MG/ML IJ SOLN
10.0000 mg | Freq: Once | INTRAMUSCULAR | Status: AC
  Administered 2019-02-01: 10 mg via INTRAVENOUS

## 2019-02-01 MED ORDER — PALONOSETRON HCL INJECTION 0.25 MG/5ML
INTRAVENOUS | Status: AC
  Filled 2019-02-01: qty 5

## 2019-02-01 MED ORDER — SODIUM CHLORIDE 0.9 % IV SOLN
Freq: Once | INTRAVENOUS | Status: AC
  Administered 2019-02-01: 10:00:00 via INTRAVENOUS
  Filled 2019-02-01: qty 250

## 2019-02-01 MED ORDER — ATROPINE SULFATE 1 MG/ML IJ SOLN
INTRAMUSCULAR | Status: AC
  Filled 2019-02-01: qty 1

## 2019-02-01 MED ORDER — PALONOSETRON HCL INJECTION 0.25 MG/5ML
0.2500 mg | Freq: Once | INTRAVENOUS | Status: AC
  Administered 2019-02-01: 0.25 mg via INTRAVENOUS

## 2019-02-01 MED ORDER — HEPARIN SOD (PORK) LOCK FLUSH 100 UNIT/ML IV SOLN
500.0000 [IU] | Freq: Once | INTRAVENOUS | Status: DC | PRN
  Filled 2019-02-01: qty 5

## 2019-02-01 MED ORDER — IRINOTECAN HCL CHEMO INJECTION 100 MG/5ML
180.0000 mg/m2 | Freq: Once | INTRAVENOUS | Status: AC
  Administered 2019-02-01: 12:00:00 320 mg via INTRAVENOUS
  Filled 2019-02-01: qty 15

## 2019-02-01 MED ORDER — SODIUM CHLORIDE 0.9 % IV SOLN
5.0000 mg/kg | Freq: Once | INTRAVENOUS | Status: AC
  Administered 2019-02-01: 11:00:00 350 mg via INTRAVENOUS
  Filled 2019-02-01: qty 14

## 2019-02-01 MED ORDER — LEUCOVORIN CALCIUM INJECTION 350 MG
400.0000 mg/m2 | Freq: Once | INTRAVENOUS | Status: AC
  Administered 2019-02-01: 712 mg via INTRAVENOUS
  Filled 2019-02-01: qty 35.6

## 2019-02-01 MED ORDER — SODIUM CHLORIDE 0.9% FLUSH
10.0000 mL | Freq: Once | INTRAVENOUS | Status: AC
  Administered 2019-02-01: 09:00:00 10 mL
  Filled 2019-02-01: qty 10

## 2019-02-01 MED ORDER — SODIUM CHLORIDE 0.9 % IV SOLN
2400.0000 mg/m2 | INTRAVENOUS | Status: DC
  Administered 2019-02-01: 4250 mg via INTRAVENOUS
  Filled 2019-02-01: qty 85

## 2019-02-01 MED ORDER — ATROPINE SULFATE 1 MG/ML IJ SOLN
0.5000 mg | Freq: Once | INTRAMUSCULAR | Status: AC | PRN
  Administered 2019-02-01: 0.5 mg via INTRAVENOUS

## 2019-02-01 MED ORDER — FLUOROURACIL CHEMO INJECTION 2.5 GM/50ML
400.0000 mg/m2 | Freq: Once | INTRAVENOUS | Status: AC
  Administered 2019-02-01: 700 mg via INTRAVENOUS
  Filled 2019-02-01: qty 14

## 2019-02-01 MED ORDER — SODIUM CHLORIDE 0.9% FLUSH
10.0000 mL | INTRAVENOUS | Status: DC | PRN
  Filled 2019-02-01: qty 10

## 2019-02-01 MED ORDER — DEXAMETHASONE SODIUM PHOSPHATE 10 MG/ML IJ SOLN
INTRAMUSCULAR | Status: AC
  Filled 2019-02-01: qty 1

## 2019-02-01 NOTE — Progress Notes (Signed)
Hematology and Oncology Follow Up Visit  Margaret Elliott [id number] 02/20/1951    02/01/19   Principle Diagnosis: 68 year old woman with colon cancer diagnosed in 2014.  She presented with stage IV of the liver with her tumor microsatellite stable,  NRAS mutated based on a biopsy obtained on 2017.    Prior Therapy:  She is status post laparoscopic laparotomy right hemicolectomy with ileocolonic anastomosis done on December 09, 2012.   FOLFOX and a Avastin chemotherapy started 01/18/2013. She is S/P 12 cycles completed in 05/2013.   She is a status post microwave ablation of metastatic lesion in the posterior segment of the right lobe of the liver completed on 09/28/2014 and repeated on 11/30/2014.  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  FOLFIRI and a Avastin salvage therapy started on 12/10/2015.  Therapy discontinued and maintained on 5-FU leucovorin with a Avastin only till March 2020.  Current therapy:     FOLFIRI and a Avastin restarted on 10/05/2018.   She is here for cycle 9 of therapy.   Interim History:  Margaret Elliott returns today for a repeat evaluation.  Since the last visit, she reports no major changes in her health.  She tolerated the last cycle of chemotherapy without any new complaints.  She did report some mild nausea but no vomiting at this time.  She does report some mild fatigue and tiredness as well as mild cramping in her abdomen.  She denies shortness of breath, cough or wheezing.  She denies any worsening neuropathy or skin changes.  Patient denied headaches, blurry vision, syncope or seizures.  Denies any fevers, chills or sweats.  Denied chest pain, palpitation, orthopnea or leg edema.  Denied cough, wheezing or hemoptysis.  Denied nausea, vomiting or abdominal pain.  Denies any constipation or diarrhea.  Denies any frequency urgency or hesitancy.  Denies any arthralgias or myalgias.   Denies any skin rashes or lesions.  Denies any bleeding or clotting tendency.  Denies any easy bruising.  Denies any hair or nail changes.  Denies any anxiety or depression.  Remaining review of system is negative.                          Medications: Reviewed today without any changes. Current Outpatient Prescriptions  Medication Sig Dispense Refill  . acetaminophen (TYLENOL) 500 MG tablet Take 500 mg by mouth every 6 (six) hours as needed for pain.      . diphenhydrAMINE (BENADRYL) 25 mg capsule Take 25-50 mg by mouth daily as needed for itching. For allergic reaction      . lidocaine-prilocaine (EMLA) cream Apply 1 application topically as needed. Apply approx 1/2 tsp to skin over port, prior to chemotherapy treatments  1 kit  3  . Multiple Vitamin (MULTIVITAMIN) tablet Take 1 tablet by mouth daily.      . ondansetron (ZOFRAN) 8 MG tablet Take 1 tablet (8 mg total) by mouth every 8 (eight) hours as needed for nausea.  30 tablet  1   No current facility-administered medications for this visit.     Allergies: No Known Allergies  Past Medical History, Surgical history, Social history, and Family History updated today without any noted change.    Physical Exam:   Blood pressure 133/84, pulse 89, temperature 97.6 F (36.4 C), temperature source Oral, resp. rate 18, height '5\' 4"'$  (1.626 m), weight 154 lb 4.8 oz (70  kg), SpO2 100 %.    ECOG: 0   General appearance: Alert, awake without any distress. Head: Atraumatic without abnormalities Oropharynx: Without any thrush or ulcers. Eyes: No scleral icterus. Lymph nodes: No lymphadenopathy noted in the cervical, supraclavicular, or axillary nodes Heart:regular rate and rhythm, without any murmurs or gallops.   Lung: Clear to auscultation without any rhonchi, wheezes or dullness to percussion. Abdomin: Soft, nontender without any shifting dullness or ascites. Musculoskeletal: No clubbing or  cyanosis. Neurological: No motor or sensory deficits. Skin: No rashes or lesions.         CBC    Component Value Date/Time   WBC 11.8 (H) 01/18/2019 0826   WBC 4.2 09/08/2017 0834   RBC 3.45 (L) 01/18/2019 0826   HGB 11.1 (L) 01/18/2019 0826   HGB 12.3 05/26/2017 0809   HCT 34.5 (L) 01/18/2019 0826   HCT 37.6 05/26/2017 0809   PLT 200 01/18/2019 0826   PLT 180 05/26/2017 0809   MCV 100.0 01/18/2019 0826   MCV 100.3 05/26/2017 0809   MCH 32.2 01/18/2019 0826   MCHC 32.2 01/18/2019 0826   RDW 16.8 (H) 01/18/2019 0826   RDW 17.4 (H) 05/26/2017 0809   LYMPHSABS 1.7 01/18/2019 0826   LYMPHSABS 1.2 05/26/2017 0809   MONOABS 0.7 01/18/2019 0826   MONOABS 0.5 05/26/2017 0809   EOSABS 0.1 01/18/2019 0826   EOSABS 0.1 05/26/2017 0809   BASOSABS 0.1 01/18/2019 0826   BASOSABS 0.0 05/26/2017 0809       Impression and Plan:  68 year old woman with:   1.  Stage IV colon cancer with peritoneal and hepatic metastasis diagnosed in 2014. Marland Kitchen   She remains on maintenance chemotherapy utilizing FOLFIRI and a Avastin without any major complications.  Her disease has been relatively stable based on imaging studies as well as tumor markers.  Risks and benefits of continuing this approach versus switching to maintenance regimen was discussed.  The plan is to proceed with same dose and schedule for now and consider switching to 5-FU leucovorin on February 15, 2019.    2.  IV access: Port-A-Cath remains in place and accessed without issues.   3. Neuropathy: Not dramatically change at this time.  4.  Abdominal discomfort: Minimally changed at this time and manageable with her current pain regimen.  This is related to her abdominal malignancy.  5.  Anxiety: Mood remained stable.  6.  Prognosis: Her disease is incurable although aggressive measures are warranted given her excellent performance status.  7.  Antiemetics: No nausea or vomiting reported at this time.  8.  Neutropenia:  She will continue to receive growth factor support after each cycle of therapy.  9. Followup: In 2 weeks for the next cycle of therapy.  25 minutes spent today face-to-face with the patient.  More than 50% of today's visit was spent on reviewing treatment options, complication related to therapy answering questions regarding future plan of care.  Zola Button MD 02/01/19

## 2019-02-01 NOTE — Patient Instructions (Signed)
[facility] Discharge Instructions for Patients Receiving Chemotherapy  Today you received the following chemotherapy agents :  Avastin, Irinotecan, Leucovorin, Fluorouracil.  To help prevent nausea and vomiting after your treatment, we encourage you to take your nausea medication as prescribed.   If you develop nausea and vomiting that is not controlled by your nausea medication, call the clinic.   BELOW ARE SYMPTOMS THAT SHOULD BE REPORTED IMMEDIATELY:  *FEVER GREATER THAN 100.5 F  *CHILLS WITH OR WITHOUT FEVER  NAUSEA AND VOMITING THAT IS NOT CONTROLLED WITH YOUR NAUSEA MEDICATION  *UNUSUAL SHORTNESS OF BREATH  *UNUSUAL BRUISING OR BLEEDING  TENDERNESS IN MOUTH AND THROAT WITH OR WITHOUT PRESENCE OF ULCERS  *URINARY PROBLEMS  *BOWEL PROBLEMS  UNUSUAL RASH Items with * indicate a potential emergency and should be followed up as soon as possible.  Feel free to call the clinic should you have any questions or concerns. The clinic phone number is [phone].  Please show the [place] at check-in to the Emergency Department and triage nurse.

## 2019-02-03 ENCOUNTER — Other Ambulatory Visit: Payer: Self-pay

## 2019-02-03 ENCOUNTER — Telehealth: Payer: Self-pay | Admitting: Oncology

## 2019-02-03 ENCOUNTER — Inpatient Hospital Stay: Payer: Medicare Other

## 2019-02-03 VITALS — BP 131/88 | HR 83 | Temp 98.2°F | Resp 18

## 2019-02-03 DIAGNOSIS — Z5111 Encounter for antineoplastic chemotherapy: Secondary | ICD-10-CM | POA: Diagnosis not present

## 2019-02-03 DIAGNOSIS — Z95828 Presence of other vascular implants and grafts: Secondary | ICD-10-CM

## 2019-02-03 DIAGNOSIS — K6389 Other specified diseases of intestine: Secondary | ICD-10-CM

## 2019-02-03 DIAGNOSIS — C189 Malignant neoplasm of colon, unspecified: Secondary | ICD-10-CM

## 2019-02-03 DIAGNOSIS — C787 Secondary malignant neoplasm of liver and intrahepatic bile duct: Secondary | ICD-10-CM | POA: Diagnosis not present

## 2019-02-03 DIAGNOSIS — K769 Liver disease, unspecified: Secondary | ICD-10-CM

## 2019-02-03 DIAGNOSIS — Z5112 Encounter for antineoplastic immunotherapy: Secondary | ICD-10-CM | POA: Diagnosis not present

## 2019-02-03 DIAGNOSIS — C786 Secondary malignant neoplasm of retroperitoneum and peritoneum: Secondary | ICD-10-CM | POA: Diagnosis not present

## 2019-02-03 DIAGNOSIS — C182 Malignant neoplasm of ascending colon: Secondary | ICD-10-CM | POA: Diagnosis not present

## 2019-02-03 MED ORDER — PEGFILGRASTIM-CBQV 6 MG/0.6ML ~~LOC~~ SOSY
6.0000 mg | PREFILLED_SYRINGE | Freq: Once | SUBCUTANEOUS | Status: AC
  Administered 2019-02-03: 6 mg via SUBCUTANEOUS

## 2019-02-03 MED ORDER — HEPARIN SOD (PORK) LOCK FLUSH 100 UNIT/ML IV SOLN
500.0000 [IU] | Freq: Once | INTRAVENOUS | Status: AC | PRN
  Administered 2019-02-03: 500 [IU] via INTRAVENOUS
  Filled 2019-02-03: qty 5

## 2019-02-03 MED ORDER — SODIUM CHLORIDE 0.9% FLUSH
10.0000 mL | Freq: Once | INTRAVENOUS | Status: AC
  Administered 2019-02-03: 10 mL
  Filled 2019-02-03: qty 10

## 2019-02-03 MED ORDER — PEGFILGRASTIM-CBQV 6 MG/0.6ML ~~LOC~~ SOSY
PREFILLED_SYRINGE | SUBCUTANEOUS | Status: AC
  Filled 2019-02-03: qty 0.6

## 2019-02-03 NOTE — Telephone Encounter (Signed)
Called and spoke with patient. Confirmed Sept appt

## 2019-02-03 NOTE — Patient Instructions (Addendum)
Central Line, Adult A central line is a thin, flexible tube (catheter) that is put in your vein. It can be used to:  Give you medicine.  Give you food and nutrients. Follow these instructions at home: Caring for the tube   Follow instructions from your doctor about: ? Flushing the tube with saline solution. ? Cleaning the tube and the area around it.  Only flush with clean (sterile) supplies. The supplies should be from your doctor, a pharmacy, or another place that your doctor recommends.  Before you flush the tube or clean the area around the tube: ? Wash your hands with soap and water. If you cannot use soap and water, use hand sanitizer. ? Clean the central line hub with rubbing alcohol. Caring for your skin  Keep the area where the tube was put in clean and dry.  Every day, and when changing the bandage, check the skin around the central line for: ? Redness, swelling, or pain. ? Fluid or blood. ? Warmth. ? Pus. ? A bad smell. General instructions  Keep the tube clamped, unless it is being used.  Keep your supplies in a clean, dry location.  If you or someone else accidentally pulls on the tube, make sure: ? The bandage (dressing) is okay. ? There is no bleeding. ? The tube has not been pulled out.  Do not use scissors or sharp objects near the tube.  Do not swim or let the tube soak in a tub.  Ask your doctor what activities are safe for you. Your doctor may tell you not to lift anything or move your arm too much.  Take over-the-counter and prescription medicines only as told by your doctor.  Change bandages as told by your doctor.  Keep your bandage dry. If a bandage gets wet, have it changed right away.  Keep all follow-up visits as told by your doctor. This is important. Throwing away supplies  Throw away any syringes in a trash (disposal) container that is only for sharp items (sharps container). You can buy a sharps container from a pharmacy, or you  can make one by using an empty hard plastic bottle with a cover.  Place any used bandages or infusion bags into a plastic bag. Throw that bag in the trash. Contact a doctor if:  You have any of these where the tube was put in: ? Redness, swelling, or pain. ? Fluid or blood. ? A warm feeling. ? Pus or a bad smell. Get help right away if:  You have: ? A fever. ? Chills. ? Trouble getting enough air (shortness of breath). ? Trouble breathing. ? Pain in your chest. ? Swelling in your neck, face, chest, or arm.  You are coughing.  You feel your heart beating fast or skipping beats.  You feel dizzy or you pass out (faint).  There are red lines coming from where the tube was put in.  The area where the tube was put in is bleeding and the bleeding will not stop.  Your tube is hard to flush.  You do not get a blood return from the tube.  The tube gets loose or comes out.  The tube has a hole or a tear.  The tube leaks. Summary  A central line is a thin, flexible tube (catheter) that is put in your vein. It can be used to take blood for lab tests or to give you medicine.  Follow instructions from your doctor about flushing and cleaning the   tube.  Keep the area where the tube was put in clean and dry.  Ask your doctor what activities are safe for you. This information is not intended to replace advice given to you by your health care provider. Make sure you discuss any questions you have with your health care provider. Document Released: 05/25/2012 Document Revised: 09/28/2018 Document Reviewed: 06/25/2016 Elsevier Patient Education  [place]. Pegfilgrastim injection What is this medicine? PEGFILGRASTIM (PEG fil gra stim) is a long-acting granulocyte colony-stimulating factor that stimulates the growth of neutrophils, a type of white blood cell important in the body's fight against infection. It is used to reduce the incidence of fever and infection in patients  with certain types of cancer who are receiving chemotherapy that affects the bone marrow, and to increase survival after being exposed to high doses of radiation. This medicine may be used for other purposes; ask your health care provider or pharmacist if you have questions. COMMON BRAND NAME(S): Steve Rattler, Ziextenzo What should I tell my health care provider before I take this medicine? They need to know if you have any of these conditions:  kidney disease  latex allergy  ongoing radiation therapy  sickle cell disease  skin reactions to acrylic adhesives (On-Body Injector only)  an unusual or allergic reaction to pegfilgrastim, filgrastim, other medicines, foods, dyes, or preservatives  pregnant or trying to get pregnant  breast-feeding How should I use this medicine? This medicine is for injection under the skin. If you get this medicine at home, you will be taught how to prepare and give the pre-filled syringe or how to use the On-body Injector. Refer to the patient Instructions for Use for detailed instructions. Use exactly as directed. Tell your healthcare provider immediately if you suspect that the On-body Injector may not have performed as intended or if you suspect the use of the On-body Injector resulted in a missed or partial dose. It is important that you put your used needles and syringes in a special sharps container. Do not put them in a trash can. If you do not have a sharps container, call your pharmacist or healthcare provider to get one. Talk to your pediatrician regarding the use of this medicine in children. While this drug may be prescribed for selected conditions, precautions do apply. Overdosage: If you think you have taken too much of this medicine contact a poison control center or emergency room at once. NOTE: This medicine is only for you. Do not share this medicine with others. What if I miss a dose? It is important not to miss your dose.  Call your doctor or health care professional if you miss your dose. If you miss a dose due to an On-body Injector failure or leakage, a new dose should be administered as soon as possible using a single prefilled syringe for manual use. What may interact with this medicine? Interactions have not been studied. Give your health care provider a list of all the medicines, herbs, non-prescription drugs, or dietary supplements you use. Also tell them if you smoke, drink alcohol, or use illegal drugs. Some items may interact with your medicine. This list may not describe all possible interactions. Give your health care provider a list of all the medicines, herbs, non-prescription drugs, or dietary supplements you use. Also tell them if you smoke, drink alcohol, or use illegal drugs. Some items may interact with your medicine. What should I watch for while using this medicine? You may need blood work done  while you are taking this medicine. If you are going to need a MRI, CT scan, or other procedure, tell your doctor that you are using this medicine (On-Body Injector only). What side effects may I notice from receiving this medicine? Side effects that you should report to your doctor or health care professional as soon as possible:  allergic reactions like skin rash, itching or hives, swelling of the face, lips, or tongue  back pain  dizziness  fever  pain, redness, or irritation at site where injected  pinpoint red spots on the skin  red or dark-brown urine  shortness of breath or breathing problems  stomach or side pain, or pain at the shoulder  swelling  tiredness  trouble passing urine or change in the amount of urine Side effects that usually do not require medical attention (report to your doctor or health care professional if they continue or are bothersome):  bone pain  muscle pain This list may not describe all possible side effects. Call your doctor for medical advice about  side effects. You may report side effects to FDA at 1-800-FDA-1088. Where should I keep my medicine? Keep out of the reach of children. If you are using this medicine at home, you will be instructed on how to store it. Throw away any unused medicine after the expiration date on the label. NOTE: This sheet is a summary. It may not cover all possible information. If you have questions about this medicine, talk to your doctor, pharmacist, or health care provider.  2020 Elsevier/Gold Standard (2017-09-13 16:57:08)

## 2019-02-13 NOTE — Progress Notes (Signed)
The following biosimilar Zirabev (bevacizumab-bvzr) has been selected for use in this patient.  Kennith Center, Pharm.D., CPP 02/13/2019@3 :12 PM

## 2019-02-15 ENCOUNTER — Inpatient Hospital Stay: Payer: Medicare Other

## 2019-02-15 ENCOUNTER — Telehealth: Payer: Self-pay | Admitting: Oncology

## 2019-02-15 ENCOUNTER — Inpatient Hospital Stay (HOSPITAL_BASED_OUTPATIENT_CLINIC_OR_DEPARTMENT_OTHER): Payer: Medicare Other | Admitting: Oncology

## 2019-02-15 ENCOUNTER — Other Ambulatory Visit: Payer: Self-pay

## 2019-02-15 VITALS — BP 133/82 | HR 72

## 2019-02-15 VITALS — BP 158/83 | HR 84 | Temp 97.8°F | Resp 17 | Wt 153.2 lb

## 2019-02-15 DIAGNOSIS — Z5112 Encounter for antineoplastic immunotherapy: Secondary | ICD-10-CM | POA: Diagnosis not present

## 2019-02-15 DIAGNOSIS — Z5111 Encounter for antineoplastic chemotherapy: Secondary | ICD-10-CM | POA: Diagnosis not present

## 2019-02-15 DIAGNOSIS — C189 Malignant neoplasm of colon, unspecified: Secondary | ICD-10-CM

## 2019-02-15 DIAGNOSIS — Z95828 Presence of other vascular implants and grafts: Secondary | ICD-10-CM

## 2019-02-15 DIAGNOSIS — K769 Liver disease, unspecified: Secondary | ICD-10-CM

## 2019-02-15 DIAGNOSIS — C182 Malignant neoplasm of ascending colon: Secondary | ICD-10-CM | POA: Diagnosis not present

## 2019-02-15 DIAGNOSIS — C787 Secondary malignant neoplasm of liver and intrahepatic bile duct: Secondary | ICD-10-CM | POA: Diagnosis not present

## 2019-02-15 DIAGNOSIS — K6389 Other specified diseases of intestine: Secondary | ICD-10-CM

## 2019-02-15 DIAGNOSIS — C786 Secondary malignant neoplasm of retroperitoneum and peritoneum: Secondary | ICD-10-CM | POA: Diagnosis not present

## 2019-02-15 LAB — CMP (CANCER CENTER ONLY)
ALT: 12 U/L (ref 0–44)
AST: 17 U/L (ref 15–41)
Albumin: 3.5 g/dL (ref 3.5–5.0)
Alkaline Phosphatase: 130 U/L — ABNORMAL HIGH (ref 38–126)
Anion gap: 8 (ref 5–15)
BUN: 14 mg/dL (ref 8–23)
CO2: 25 mmol/L (ref 22–32)
Calcium: 8.9 mg/dL (ref 8.9–10.3)
Chloride: 109 mmol/L (ref 98–111)
Creatinine: 1.1 mg/dL — ABNORMAL HIGH (ref 0.44–1.00)
GFR, Est AFR Am: 59 mL/min — ABNORMAL LOW (ref >60)
GFR, Estimated: 51 mL/min — ABNORMAL LOW (ref >60)
Glucose, Bld: 98 mg/dL (ref 70–99)
Potassium: 3.8 mmol/L (ref 3.5–5.1)
Sodium: 142 mmol/L (ref 135–145)
Total Bilirubin: 0.5 mg/dL (ref 0.3–1.2)
Total Protein: 7 g/dL (ref 6.5–8.1)

## 2019-02-15 LAB — CBC WITH DIFFERENTIAL (CANCER CENTER ONLY)
Abs Immature Granulocytes: 0.97 10*3/uL — ABNORMAL HIGH (ref 0.00–0.07)
Basophils Absolute: 0.1 10*3/uL (ref 0.0–0.1)
Basophils Relative: 1 %
Eosinophils Absolute: 0.1 10*3/uL (ref 0.0–0.5)
Eosinophils Relative: 1 %
HCT: 36.1 % (ref 36.0–46.0)
Hemoglobin: 11.7 g/dL — ABNORMAL LOW (ref 12.0–15.0)
Immature Granulocytes: 8 %
Lymphocytes Relative: 15 %
Lymphs Abs: 1.9 10*3/uL (ref 0.7–4.0)
MCH: 32.5 pg (ref 26.0–34.0)
MCHC: 32.4 g/dL (ref 30.0–36.0)
MCV: 100.3 fL — ABNORMAL HIGH (ref 80.0–100.0)
Monocytes Absolute: 0.9 10*3/uL (ref 0.1–1.0)
Monocytes Relative: 7 %
Neutro Abs: 8.7 10*3/uL — ABNORMAL HIGH (ref 1.7–7.7)
Neutrophils Relative %: 68 %
Platelet Count: 205 10*3/uL (ref 150–400)
RBC: 3.6 MIL/uL — ABNORMAL LOW (ref 3.87–5.11)
RDW: 15.9 % — ABNORMAL HIGH (ref 11.5–15.5)
WBC Count: 12.6 10*3/uL — ABNORMAL HIGH (ref 4.0–10.5)
nRBC: 0.2 % (ref 0.0–0.2)

## 2019-02-15 LAB — CEA (IN HOUSE-CHCC): CEA (CHCC-In House): 12.08 ng/mL — ABNORMAL HIGH (ref 0.00–5.00)

## 2019-02-15 MED ORDER — SODIUM CHLORIDE 0.9 % IV SOLN
Freq: Once | INTRAVENOUS | Status: AC
  Administered 2019-02-15: 10:00:00 via INTRAVENOUS
  Filled 2019-02-15: qty 250

## 2019-02-15 MED ORDER — DEXAMETHASONE SODIUM PHOSPHATE 10 MG/ML IJ SOLN
INTRAMUSCULAR | Status: AC
  Filled 2019-02-15: qty 1

## 2019-02-15 MED ORDER — SODIUM CHLORIDE 0.9 % IV SOLN
5.0000 mg/kg | Freq: Once | INTRAVENOUS | Status: AC
  Administered 2019-02-15: 350 mg via INTRAVENOUS
  Filled 2019-02-15: qty 14

## 2019-02-15 MED ORDER — HEPARIN SOD (PORK) LOCK FLUSH 100 UNIT/ML IV SOLN
500.0000 [IU] | Freq: Once | INTRAVENOUS | Status: DC | PRN
  Filled 2019-02-15: qty 5

## 2019-02-15 MED ORDER — SODIUM CHLORIDE 0.9 % IV SOLN
2400.0000 mg/m2 | INTRAVENOUS | Status: DC
  Administered 2019-02-15: 4250 mg via INTRAVENOUS
  Filled 2019-02-15: qty 85

## 2019-02-15 MED ORDER — PALONOSETRON HCL INJECTION 0.25 MG/5ML
INTRAVENOUS | Status: AC
  Filled 2019-02-15: qty 5

## 2019-02-15 MED ORDER — ATROPINE SULFATE 0.4 MG/ML IJ SOLN
INTRAMUSCULAR | Status: AC
  Filled 2019-02-15: qty 1

## 2019-02-15 MED ORDER — ATROPINE SULFATE 1 MG/ML IJ SOLN
0.5000 mg | Freq: Once | INTRAMUSCULAR | Status: AC | PRN
  Administered 2019-02-15: 11:00:00 0.5 mg via INTRAVENOUS

## 2019-02-15 MED ORDER — ATROPINE SULFATE 1 MG/ML IJ SOLN
INTRAMUSCULAR | Status: AC
  Filled 2019-02-15: qty 1

## 2019-02-15 MED ORDER — SODIUM CHLORIDE 0.9% FLUSH
10.0000 mL | INTRAVENOUS | Status: DC | PRN
  Filled 2019-02-15: qty 10

## 2019-02-15 MED ORDER — PALONOSETRON HCL INJECTION 0.25 MG/5ML
0.2500 mg | Freq: Once | INTRAVENOUS | Status: AC
  Administered 2019-02-15: 10:00:00 0.25 mg via INTRAVENOUS

## 2019-02-15 MED ORDER — DEXAMETHASONE SODIUM PHOSPHATE 10 MG/ML IJ SOLN
10.0000 mg | Freq: Once | INTRAMUSCULAR | Status: AC
  Administered 2019-02-15: 10:00:00 10 mg via INTRAVENOUS

## 2019-02-15 MED ORDER — LEUCOVORIN CALCIUM INJECTION 350 MG
400.0000 mg/m2 | Freq: Once | INTRAVENOUS | Status: AC
  Administered 2019-02-15: 11:00:00 712 mg via INTRAVENOUS
  Filled 2019-02-15: qty 35.6

## 2019-02-15 MED ORDER — IRINOTECAN HCL CHEMO INJECTION 100 MG/5ML
180.0000 mg/m2 | Freq: Once | INTRAVENOUS | Status: AC
  Administered 2019-02-15: 320 mg via INTRAVENOUS
  Filled 2019-02-15: qty 15

## 2019-02-15 MED ORDER — FLUOROURACIL CHEMO INJECTION 2.5 GM/50ML
400.0000 mg/m2 | Freq: Once | INTRAVENOUS | Status: AC
  Administered 2019-02-15: 13:00:00 700 mg via INTRAVENOUS
  Filled 2019-02-15: qty 14

## 2019-02-15 MED ORDER — SODIUM CHLORIDE 0.9% FLUSH
10.0000 mL | Freq: Once | INTRAVENOUS | Status: AC
  Administered 2019-02-15: 09:00:00 10 mL
  Filled 2019-02-15: qty 10

## 2019-02-15 NOTE — Telephone Encounter (Signed)
Called and left msg. Mailed printout  °

## 2019-02-15 NOTE — Progress Notes (Signed)
Okay to treat with Avastin today with urine protein results from 7/29 per Dr. Alen Blew.

## 2019-02-15 NOTE — Patient Instructions (Signed)
[facility] Discharge Instructions for Patients Receiving Chemotherapy  Today you received the following chemotherapy agents:  Avastin, Irinotecan, Leucovorin, Fluorouracil  To help prevent nausea and vomiting after your treatment, we encourage you to take your nausea medication as prescribed.   If you develop nausea and vomiting that is not controlled by your nausea medication, call the clinic.   BELOW ARE SYMPTOMS THAT SHOULD BE REPORTED IMMEDIATELY:  *FEVER GREATER THAN 100.5 F  *CHILLS WITH OR WITHOUT FEVER  NAUSEA AND VOMITING THAT IS NOT CONTROLLED WITH YOUR NAUSEA MEDICATION  *UNUSUAL SHORTNESS OF BREATH  *UNUSUAL BRUISING OR BLEEDING  TENDERNESS IN MOUTH AND THROAT WITH OR WITHOUT PRESENCE OF ULCERS  *URINARY PROBLEMS  *BOWEL PROBLEMS  UNUSUAL RASH Items with * indicate a potential emergency and should be followed up as soon as possible.  Feel free to call the clinic should you have any questions or concerns. The clinic phone number is [phone].  Please show the CHEMO ALERT CARD at check-in to the Emergency Department and triage nurse.   

## 2019-02-15 NOTE — Progress Notes (Signed)
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    Prior Therapy:  She is status post laparoscopic laparotomy right hemicolectomy with ileocolonic anastomosis done on December 09, 2012.   FOLFOX and a Avastin chemotherapy started 01/18/2013. She is S/P 12 cycles completed in 05/2013.   She is a status post microwave ablation of metastatic lesion in the posterior segment of the right lobe of the liver completed on 09/28/2014 and repeated on 11/30/2014.  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  FOLFIRI and a Avastin salvage therapy started on 12/10/2015.  Therapy discontinued and maintained on 5-FU leucovorin with a Avastin only till March 2020.  Current therapy:     FOLFIRI and a Avastin restarted on 10/05/2018.   She is here for cycle 10 of therapy.   Interim History:  Margaret Elliott is here for a follow-up visit.  Since her last visit, reports no major changes in her health.  She continues to tolerate chemotherapy without any new complications.  She does report some fatigue some nausea that lasts for 3 to 4 days afterwards.  She still able to eat and maintain adequate nutrition and remains active.  She has reasonable quality of life that has not changed.  She has not had any worsening neuropathy.  She denied headaches, blurry vision, syncope or seizures.  Denies any fevers, chills or sweats.  Denied chest pain, palpitation, orthopnea or leg edema.  Denied cough, wheezing or hemoptysis.  Denied nausea, vomiting or abdominal pain.  Denies any constipation or diarrhea.  Denies any frequency urgency or hesitancy.  Denies any arthralgias or  myalgias.  Denies any skin rashes or lesions.  Denies any bleeding or clotting tendency.  Denies any easy bruising.  Denies any hair or nail changes.  Denies any anxiety or depression.  Remaining review of system is negative.     Medications: Reviewed without any changes. Current Outpatient Prescriptions  Medication Sig Dispense Refill  . acetaminophen (TYLENOL) 500 MG tablet Take 500 mg by mouth every 6 (six) hours as needed for pain.      . diphenhydrAMINE (BENADRYL) 25 mg capsule Take 25-50 mg by mouth daily as needed for itching. For allergic reaction      . lidocaine-prilocaine (EMLA) cream Apply 1 application topically as needed. Apply approx 1/2 tsp to skin over port, prior to chemotherapy treatments  1 kit  3  . Multiple Vitamin (MULTIVITAMIN) tablet Take 1 tablet by mouth daily.      . ondansetron (ZOFRAN) 8 MG tablet Take 1 tablet (8 mg total) by mouth every 8 (eight) hours as needed for nausea.  30 tablet  1   No current facility-administered medications for this visit.     Allergies: No Known Allergies  Past Medical History, Surgical history, Social history, and Family History remains unchanged on review.    Physical Exam:   Blood pressure (!) 158/83, pulse 84, temperature 97.8 F (36.6 C), temperature source Tympanic, resp. rate 17, weight 153 lb 3 oz (69.5 kg), SpO2 100 %.     ECOG: 0   General appearance: Comfortable appearing without any discomfort Head: Normocephalic without any trauma Oropharynx: Mucous  membranes are moist and pink without any thrush or ulcers. Eyes: Pupils are equal and round reactive to light. Lymph nodes: No cervical, supraclavicular, inguinal or axillary lymphadenopathy.   Heart:regular rate and rhythm.  S1 and S2 without leg edema. Lung: Clear without any rhonchi or wheezes.  No dullness to percussion. Abdomin: Soft, nontender, nondistended with good bowel sounds.  No hepatosplenomegaly. Musculoskeletal: No joint deformity or  effusion.  Full range of motion noted. Neurological: No deficits noted on motor, sensory and deep tendon reflex exam. Skin: No petechial rash or dryness.  Appeared moist.          CBC    Component Value Date/Time   WBC 7.8 02/01/2019 0849   WBC 4.2 09/08/2017 0834   RBC 3.49 (L) 02/01/2019 0849   HGB 11.2 (L) 02/01/2019 0849   HGB 12.3 05/26/2017 0809   HCT 35.5 (L) 02/01/2019 0849   HCT 37.6 05/26/2017 0809   PLT 210 02/01/2019 0849   PLT 180 05/26/2017 0809   MCV 101.7 (H) 02/01/2019 0849   MCV 100.3 05/26/2017 0809   MCH 32.1 02/01/2019 0849   MCHC 31.5 02/01/2019 0849   RDW 17.0 (H) 02/01/2019 0849   RDW 17.4 (H) 05/26/2017 0809   LYMPHSABS 1.5 02/01/2019 0849   LYMPHSABS 1.2 05/26/2017 0809   MONOABS 0.9 02/01/2019 0849   MONOABS 0.5 05/26/2017 0809   EOSABS 0.1 02/01/2019 0849   EOSABS 0.1 05/26/2017 0809   BASOSABS 0.0 02/01/2019 0849   BASOSABS 0.0 05/26/2017 0809   Results for Margaret Elliott, Margaret Elliott (MRN [id number]) as of 02/15/2019 08:45  Ref. Range 01/18/2019 08:26 02/01/2019 08:49  [facility] ([facility]) Latest Ref Range: 0.00 - 5.00 ng/mL 15.93 (H) 13.46 (H)      Impression and Plan:  68 year old woman with:   1.  Colon cancer diagnosed in 2014 she was found to have stage IV disease with hepatic metastasis.  She subsequently developed peritoneal involvement in 2017.     She has tolerated chemotherapy without any major complications utilizing FOLFIRI and Avastin.  She continues to have clinical response as well as drop in her tumor markers.  Risks and benefits of continuing this therapy was discussed.  Switching to maintenance 5-FU with a Avastin only was also reviewed.  The time being the plan is to complete tentatively 12 cycles of therapy and switch to maintenance afterwards.  We will repeat imaging studies in December 2020.    2.  IV access: Port-A-Cath remains in use without any issues.   3. 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  4.  Abdominal discomfort: Related to malignancy and remained relatively stable at this time.  She does use hydrocodone at times intermittently.  5.  Anxiety: No recent exacerbation of her anxiety and mood remains relatively stable.  6.  Prognosis: Therapy remains palliative although her performance status is excellent and aggressive measures are warranted.  7.  Antiemetics: Antiemetics are available to her and nausea has been controlled.  8.  Neutropenia: Related to chemotherapy and will require prophylaxis for week after each cycle of treatment.  She is susceptible to developing neutropenic sepsis without growth factor support.  9. Followup: She will follow-up in 2 weeks for the next cycle of therapy.  25 minutes spent today face-to-face with the patient.  More than 50% of today's visit was gated to updating her disease status, treatment options, complications related therapy and addressing future plan of care.  Zola Button MD 02/15/19

## 2019-02-17 ENCOUNTER — Other Ambulatory Visit: Payer: Self-pay

## 2019-02-17 ENCOUNTER — Other Ambulatory Visit: Payer: Self-pay | Admitting: Medical

## 2019-02-17 ENCOUNTER — Inpatient Hospital Stay (HOSPITAL_BASED_OUTPATIENT_CLINIC_OR_DEPARTMENT_OTHER): Payer: Medicare Other | Admitting: Medical

## 2019-02-17 ENCOUNTER — Inpatient Hospital Stay: Payer: Medicare Other

## 2019-02-17 VITALS — BP 111/76 | HR 66 | Temp 98.0°F | Resp 17

## 2019-02-17 VITALS — BP 124/83 | HR 73 | Resp 18

## 2019-02-17 DIAGNOSIS — C787 Secondary malignant neoplasm of liver and intrahepatic bile duct: Secondary | ICD-10-CM

## 2019-02-17 DIAGNOSIS — C189 Malignant neoplasm of colon, unspecified: Secondary | ICD-10-CM | POA: Diagnosis not present

## 2019-02-17 DIAGNOSIS — C182 Malignant neoplasm of ascending colon: Secondary | ICD-10-CM | POA: Diagnosis not present

## 2019-02-17 DIAGNOSIS — R55 Syncope and collapse: Secondary | ICD-10-CM

## 2019-02-17 DIAGNOSIS — K6389 Other specified diseases of intestine: Secondary | ICD-10-CM

## 2019-02-17 DIAGNOSIS — R197 Diarrhea, unspecified: Secondary | ICD-10-CM

## 2019-02-17 DIAGNOSIS — Z95828 Presence of other vascular implants and grafts: Secondary | ICD-10-CM | POA: Diagnosis not present

## 2019-02-17 DIAGNOSIS — Z5111 Encounter for antineoplastic chemotherapy: Secondary | ICD-10-CM | POA: Diagnosis not present

## 2019-02-17 DIAGNOSIS — Z5112 Encounter for antineoplastic immunotherapy: Secondary | ICD-10-CM | POA: Diagnosis not present

## 2019-02-17 DIAGNOSIS — K769 Liver disease, unspecified: Secondary | ICD-10-CM

## 2019-02-17 DIAGNOSIS — C786 Secondary malignant neoplasm of retroperitoneum and peritoneum: Secondary | ICD-10-CM | POA: Diagnosis not present

## 2019-02-17 MED ORDER — SODIUM CHLORIDE 0.9% FLUSH
10.0000 mL | INTRAVENOUS | Status: AC | PRN
  Administered 2019-02-17: 14:00:00 10 mL
  Filled 2019-02-17: qty 10

## 2019-02-17 MED ORDER — DIPHENOXYLATE-ATROPINE 2.5-0.025 MG PO TABS
1.0000 | ORAL_TABLET | Freq: Once | ORAL | Status: AC
  Administered 2019-02-17: 12:00:00 1 via ORAL

## 2019-02-17 MED ORDER — SODIUM CHLORIDE 0.9 % IV SOLN
INTRAVENOUS | Status: DC
  Administered 2019-02-17: 12:00:00 via INTRAVENOUS
  Filled 2019-02-17 (×2): qty 250

## 2019-02-17 MED ORDER — HEPARIN SOD (PORK) LOCK FLUSH 100 UNIT/ML IV SOLN
500.0000 [IU] | Freq: Once | INTRAVENOUS | Status: DC | PRN
  Filled 2019-02-17: qty 5

## 2019-02-17 MED ORDER — DIPHENOXYLATE-ATROPINE 2.5-0.025 MG PO TABS
ORAL_TABLET | ORAL | Status: AC
  Filled 2019-02-17: qty 1

## 2019-02-17 MED ORDER — SODIUM CHLORIDE 0.9% FLUSH
10.0000 mL | INTRAVENOUS | Status: DC | PRN
  Administered 2019-02-17: 10 mL
  Filled 2019-02-17: qty 10

## 2019-02-17 MED ORDER — PEGFILGRASTIM-CBQV 6 MG/0.6ML ~~LOC~~ SOSY
PREFILLED_SYRINGE | SUBCUTANEOUS | Status: AC
  Filled 2019-02-17: qty 0.6

## 2019-02-17 MED ORDER — HEPARIN SOD (PORK) LOCK FLUSH 100 UNIT/ML IV SOLN
500.0000 [IU] | INTRAVENOUS | Status: AC | PRN
  Administered 2019-02-17: 500 [IU]
  Filled 2019-02-17: qty 5

## 2019-02-17 MED ORDER — PEGFILGRASTIM-CBQV 6 MG/0.6ML ~~LOC~~ SOSY
6.0000 mg | PREFILLED_SYRINGE | Freq: Once | SUBCUTANEOUS | Status: AC
  Administered 2019-02-17: 6 mg via SUBCUTANEOUS

## 2019-02-17 NOTE — Progress Notes (Signed)
[facility] Progress Note   Margaret Elliott ON:[id number] 1951-05-26 68 y.o.  Margaret Elliott is managed by Dr. 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  She is scheduled to see Dr. Alen Blew in follow-up on 03/02/2019.  Syncope: The patient reports sliding out of her chair at the kitchen table today onto the floor.  She reports that her husband states that she was "out for several seconds" she was given 1 L of normal saline IV today.  Her labs returned showing no evidence of gross dehydration.  She was told to push fluids.  She states that she is feeling better after receiving her IV fluids.  Diarrhea: The patient was instructed to continue using Imodium as needed.  She was given 1 dose of Lomotil today.  Please see After Visit Summary for patient specific instructions.  Future Appointments  Date Time Provider [place]  03/02/2019  9:00 AM [facility] LAB 1 [facility] None  03/02/2019  9:15 AM [facility] [place] FLUSH [facility] None  03/02/2019  9:30 AM Wyatt Portela, MD [facility] None  03/02/2019 10:00 AM [facility] INFUSION [facility] None  03/04/2019 12:30 PM [facility] FLUSH [facility] None  03/15/2019  7:45 AM [facility] LAB 3 [facility] None  03/15/2019  8:00 AM [facility] [place] FLUSH [facility] None  03/15/2019  8:30 AM Shadad, Mathis Dad, MD [facility] None  03/15/2019  9:15 AM [facility] INFUSION [facility] None  03/17/2019 12:30 PM [facility] [place] FLUSH [facility] None    No  orders of the defined types were placed in this encounter.      Subjective:   Patient ID:  Margaret Elliott is a 68 y.o. (DOB Oct 30, 1950) female.  Chief Complaint: No chief complaint on file.   HPI Margaret Elliott is a 68 year old female with a diagnosis of a metastatic malignant neoplasm of the ascending colon.  She continues to be treated with FOLFIRI and Avastin with her last cycle given on 02/15/2019.   She reports sliding out of her chair at the kitchen table today onto the floor.  She reports that her husband states that she was "out for several seconds."  She suffered no injuries this morning.  She reports having orthostatic dizziness.  Her intake of solids and liquids has been lower recently.  She has been having 4-5 loose stools daily.  She has had around 5 since early this morning.  She has been taking Imodium as needed.  Medications: I have reviewed the patient's current medications.  Allergies: No Known Allergies  Past Medical History:  Diagnosis Date  . Anemia   . colon ca dx'd 11/2012  . History of chemotherapy     Past Surgical History:  Procedure Laterality Date  . ABDOMINAL HYSTERECTOMY    . ABDOMINAL HYSTERECTOMY    . ablation of liver      09/2014  . APPENDECTOMY N/A 12/09/2012   Procedure: APPENDECTOMY;  Surgeon: Ralene Ok, MD;  Location: [facility];  Service: General;  Laterality: N/A;  . GANGLION CYST EXCISION    . IR GENERIC HISTORICAL  08/09/2014   IR RADIOLOGIST  EVAL & MGMT 08/09/2014 Sandi Mariscal, MD [facility] INTERV RAD  . LAPAROSCOPY N/A 12/09/2012   Procedure: LAPAROSCOPY DIAGNOSTIC;  Surgeon: Ralene Ok, MD;  Location: [facility];  Service: General;  Laterality: N/A;  . PARTIAL COLECTOMY Right 12/09/2012   Procedure:  Right Hemi-colectomy, Resection of Distal Ileum;  Surgeon: Ralene Ok, MD;  Location: [facility];  Service: General;  Laterality: Right;  . PORTACATH PLACEMENT Left 01/04/2013   Procedure: INSERTION PORT-A-CATH;  Surgeon: Ralene Ok, MD;   Location: [facility];  Service: General;  Laterality: Left;  . TUBAL LIGATION      Family History  Problem Relation Age of Onset  . Heart disease Mother   . COPD Mother     Social History   Socioeconomic History  . Marital status: Married    Spouse name: Not on file  . Number of children: 3  . Years of education: Not on file  . Highest education level: Not on file  Occupational History  . Occupation: Scientist, clinical (histocompatibility and immunogenetics): [employer]  . Financial resource strain: Not on file  . Food insecurity    Worry: Not on file    Inability: Not on file  . Transportation needs    Medical: Not on file    Non-medical: Not on file  Tobacco Use  . Smoking status: Former Smoker    Packs/day: 0.25    Years: 4.00    Pack years: 1.00    Types: Cigarettes    Quit date: 06/22/1972    Years since quitting: 46.6  . Smokeless tobacco: Never Used  Substance and Sexual Activity  . Alcohol use: Yes    Comment: wine a couple of times a week, occ  . Drug use: No  . Sexual activity: Not on file  Lifestyle  . Physical activity    Days per week: Not on file    Minutes per session: Not on file  . Stress: Not on file  Relationships  . Social Herbalist on phone: Not on file    Gets together: Not on file    Attends religious service: Not on file    Active member of club or organization: Not on file    Attends meetings of clubs or organizations: Not on file    Relationship status: Not on file  . Intimate partner violence    Fear of current or ex partner: Not on file    Emotionally abused: Not on file    Physically abused: Not on file    Forced sexual activity: Not on file  Other Topics Concern  . Not on file  Social History Narrative  . Not on file    Past Medical History, Surgical history, Social history, and Family history were reviewed and updated as appropriate.   Please see review of systems for further details on the patient's review from today.   Review of Systems:   Review of Systems  Constitutional: Positive for appetite change. Negative for chills, diaphoresis and fever.  Respiratory: Negative for cough, chest tightness and shortness of breath.   Cardiovascular: Negative for chest pain, palpitations and leg swelling.  Gastrointestinal: Positive for diarrhea. Negative for abdominal distention, abdominal pain, blood in stool, constipation, nausea and vomiting.  Genitourinary: Negative for decreased urine volume and difficulty urinating.  Neurological: Positive for dizziness and syncope. Negative for weakness.    Objective:   Physical Exam:  There were no vitals taken for this visit. ECOG: 1  Physical Exam  Constitutional:      General: She is not in acute distress.    Appearance: She is not diaphoretic.  HENT:     Head: Normocephalic and atraumatic.  Cardiovascular:     Rate and Rhythm: Normal rate and regular rhythm.     Heart sounds: Normal heart sounds. No murmur. No friction rub. No gallop.   Pulmonary:     Effort: Pulmonary effort is normal. No respiratory distress.     Breath sounds: Normal breath sounds. No wheezing or rales.  Skin:    General: Skin is warm and dry.     Findings: No erythema or rash.  Neurological:     Mental Status: She is alert.     Lab Review:     Component Value Date/Time   NA 142 02/15/2019 0835   NA 141 05/26/2017 0809   K 3.8 02/15/2019 0835   K 4.0 05/26/2017 0809   CL 109 02/15/2019 0835   CO2 25 02/15/2019 0835   CO2 23 05/26/2017 0809   GLUCOSE 98 02/15/2019 0835   GLUCOSE 95 05/26/2017 0809   BUN 14 02/15/2019 0835   BUN 14.5 05/26/2017 0809   CREATININE 1.10 (H) 02/15/2019 0835   CREATININE 1.0 05/26/2017 0809   CALCIUM 8.9 02/15/2019 0835   CALCIUM 9.4 05/26/2017 0809   PROT 7.0 02/15/2019 0835   PROT 7.2 05/26/2017 0809   ALBUMIN 3.5 02/15/2019 0835   ALBUMIN 3.5 05/26/2017 0809   AST 17 02/15/2019 0835   AST 18 05/26/2017 0809   ALT 12 02/15/2019 0835   ALT 11 05/26/2017 0809    ALKPHOS 130 (H) 02/15/2019 0835   ALKPHOS 59 05/26/2017 0809   BILITOT 0.5 02/15/2019 0835   BILITOT 1.11 05/26/2017 0809   GFRNONAA 51 (L) 02/15/2019 0835   GFRNONAA 57 (L) 02/12/2015 0706   GFRAA 59 (L) 02/15/2019 0835   GFRAA 65 02/12/2015 0706       Component Value Date/Time   WBC 12.6 (H) 02/15/2019 0835   WBC 4.2 09/08/2017 0834   RBC 3.60 (L) 02/15/2019 0835   HGB 11.7 (L) 02/15/2019 0835   HGB 12.3 05/26/2017 0809   HCT 36.1 02/15/2019 0835   HCT 37.6 05/26/2017 0809   PLT 205 02/15/2019 0835   PLT 180 05/26/2017 0809   MCV 100.3 (H) 02/15/2019 0835   MCV 100.3 05/26/2017 0809   MCH 32.5 02/15/2019 0835   MCHC 32.4 02/15/2019 0835   RDW 15.9 (H) 02/15/2019 0835   RDW 17.4 (H) 05/26/2017 0809   LYMPHSABS 1.9 02/15/2019 0835   LYMPHSABS 1.2 05/26/2017 0809   MONOABS 0.9 02/15/2019 0835   MONOABS 0.5 05/26/2017 0809   EOSABS 0.1 02/15/2019 0835   EOSABS 0.1 05/26/2017 0809   BASOSABS 0.1 02/15/2019 0835   BASOSABS 0.0 05/26/2017 0809   -------------------------------  Imaging from last 24 hours (if applicable):  Radiology interpretation: No results found.

## 2019-02-17 NOTE — Patient Instructions (Signed)

## 2019-02-17 NOTE — Progress Notes (Signed)
Pt stated that this am "one hour before her appt here" "I passed out, while sitting in my chair and slid out". pt stated she hit her knee, missed her head, pt states her husband said she was out about 10 seconds. Pt states she has had 4-5 episodes of diarrhea in the past 24 hours. Last few episodes were "clear in color". Dizziness, but no problems forming words, or memory problems. No other symptoms besides dizziness and diarrhea .  VS as charted in flowsheets. Sandi Mealy to flush room to assess patient. Pt to infusion room to receive 2 hours of IVF.

## 2019-02-17 NOTE — Progress Notes (Signed)
Pt brought to infusion for IV fluids, pt had several episodes of diarrhea this a.m. at home, felt very weak, states she lost consciousness & fell.  IV fluids infusing, VS stable @ this time.

## 2019-03-02 ENCOUNTER — Inpatient Hospital Stay: Payer: Medicare Other

## 2019-03-02 ENCOUNTER — Other Ambulatory Visit: Payer: Self-pay

## 2019-03-02 ENCOUNTER — Inpatient Hospital Stay: Payer: Medicare Other | Attending: Oncology | Admitting: Oncology

## 2019-03-02 VITALS — BP 135/78 | HR 81 | Temp 98.3°F | Resp 17 | Ht 64.0 in | Wt 152.9 lb

## 2019-03-02 VITALS — BP 129/87

## 2019-03-02 DIAGNOSIS — Z7689 Persons encountering health services in other specified circumstances: Secondary | ICD-10-CM | POA: Insufficient documentation

## 2019-03-02 DIAGNOSIS — E860 Dehydration: Secondary | ICD-10-CM | POA: Insufficient documentation

## 2019-03-02 DIAGNOSIS — G893 Neoplasm related pain (acute) (chronic): Secondary | ICD-10-CM | POA: Diagnosis not present

## 2019-03-02 DIAGNOSIS — G629 Polyneuropathy, unspecified: Secondary | ICD-10-CM | POA: Diagnosis not present

## 2019-03-02 DIAGNOSIS — Z79899 Other long term (current) drug therapy: Secondary | ICD-10-CM | POA: Insufficient documentation

## 2019-03-02 DIAGNOSIS — C182 Malignant neoplasm of ascending colon: Secondary | ICD-10-CM | POA: Diagnosis not present

## 2019-03-02 DIAGNOSIS — R197 Diarrhea, unspecified: Secondary | ICD-10-CM | POA: Diagnosis not present

## 2019-03-02 DIAGNOSIS — C189 Malignant neoplasm of colon, unspecified: Secondary | ICD-10-CM

## 2019-03-02 DIAGNOSIS — Z5112 Encounter for antineoplastic immunotherapy: Secondary | ICD-10-CM | POA: Diagnosis present

## 2019-03-02 DIAGNOSIS — Z5111 Encounter for antineoplastic chemotherapy: Secondary | ICD-10-CM | POA: Diagnosis present

## 2019-03-02 DIAGNOSIS — Z95828 Presence of other vascular implants and grafts: Secondary | ICD-10-CM

## 2019-03-02 DIAGNOSIS — C786 Secondary malignant neoplasm of retroperitoneum and peritoneum: Secondary | ICD-10-CM | POA: Insufficient documentation

## 2019-03-02 DIAGNOSIS — K6389 Other specified diseases of intestine: Secondary | ICD-10-CM

## 2019-03-02 DIAGNOSIS — F419 Anxiety disorder, unspecified: Secondary | ICD-10-CM | POA: Insufficient documentation

## 2019-03-02 DIAGNOSIS — C787 Secondary malignant neoplasm of liver and intrahepatic bile duct: Secondary | ICD-10-CM | POA: Diagnosis not present

## 2019-03-02 DIAGNOSIS — K769 Liver disease, unspecified: Secondary | ICD-10-CM

## 2019-03-02 LAB — CBC WITH DIFFERENTIAL (CANCER CENTER ONLY)
Abs Immature Granulocytes: 0.28 10*3/uL — ABNORMAL HIGH (ref 0.00–0.07)
Basophils Absolute: 0.1 10*3/uL (ref 0.0–0.1)
Basophils Relative: 1 %
Eosinophils Absolute: 0.1 10*3/uL (ref 0.0–0.5)
Eosinophils Relative: 1 %
HCT: 38 % (ref 36.0–46.0)
Hemoglobin: 12.1 g/dL (ref 12.0–15.0)
Immature Granulocytes: 3 %
Lymphocytes Relative: 18 %
Lymphs Abs: 1.8 10*3/uL (ref 0.7–4.0)
MCH: 32.4 pg (ref 26.0–34.0)
MCHC: 31.8 g/dL (ref 30.0–36.0)
MCV: 101.9 fL — ABNORMAL HIGH (ref 80.0–100.0)
Monocytes Absolute: 0.7 10*3/uL (ref 0.1–1.0)
Monocytes Relative: 8 %
Neutro Abs: 6.6 10*3/uL (ref 1.7–7.7)
Neutrophils Relative %: 69 %
Platelet Count: 229 10*3/uL (ref 150–400)
RBC: 3.73 MIL/uL — ABNORMAL LOW (ref 3.87–5.11)
RDW: 15.8 % — ABNORMAL HIGH (ref 11.5–15.5)
WBC Count: 9.5 10*3/uL (ref 4.0–10.5)
nRBC: 0 % (ref 0.0–0.2)

## 2019-03-02 LAB — CMP (CANCER CENTER ONLY)
ALT: 12 U/L (ref 0–44)
AST: 18 U/L (ref 15–41)
Albumin: 3.6 g/dL (ref 3.5–5.0)
Alkaline Phosphatase: 134 U/L — ABNORMAL HIGH (ref 38–126)
Anion gap: 7 (ref 5–15)
BUN: 15 mg/dL (ref 8–23)
CO2: 25 mmol/L (ref 22–32)
Calcium: 9 mg/dL (ref 8.9–10.3)
Chloride: 108 mmol/L (ref 98–111)
Creatinine: 1.1 mg/dL — ABNORMAL HIGH (ref 0.44–1.00)
GFR, Est AFR Am: 59 mL/min — ABNORMAL LOW (ref >60)
GFR, Estimated: 51 mL/min — ABNORMAL LOW (ref >60)
Glucose, Bld: 117 mg/dL — ABNORMAL HIGH (ref 70–99)
Potassium: 4.3 mmol/L (ref 3.5–5.1)
Sodium: 140 mmol/L (ref 135–145)
Total Bilirubin: 0.5 mg/dL (ref 0.3–1.2)
Total Protein: 7.2 g/dL (ref 6.5–8.1)

## 2019-03-02 LAB — CEA (IN HOUSE-CHCC): CEA (CHCC-In House): 11.23 ng/mL — ABNORMAL HIGH (ref 0.00–5.00)

## 2019-03-02 MED ORDER — FLUOROURACIL CHEMO INJECTION 2.5 GM/50ML
400.0000 mg/m2 | Freq: Once | INTRAVENOUS | Status: AC
  Administered 2019-03-02: 700 mg via INTRAVENOUS
  Filled 2019-03-02: qty 14

## 2019-03-02 MED ORDER — SODIUM CHLORIDE 0.9 % IV SOLN
2400.0000 mg/m2 | INTRAVENOUS | Status: DC
  Administered 2019-03-02: 4250 mg via INTRAVENOUS
  Filled 2019-03-02: qty 85

## 2019-03-02 MED ORDER — LEUCOVORIN CALCIUM INJECTION 350 MG
400.0000 mg/m2 | Freq: Once | INTRAVENOUS | Status: AC
  Administered 2019-03-02: 712 mg via INTRAVENOUS
  Filled 2019-03-02: qty 35.6

## 2019-03-02 MED ORDER — SODIUM CHLORIDE 0.9 % IV SOLN
5.0000 mg/kg | Freq: Once | INTRAVENOUS | Status: AC
  Administered 2019-03-02: 350 mg via INTRAVENOUS
  Filled 2019-03-02: qty 14

## 2019-03-02 MED ORDER — PALONOSETRON HCL INJECTION 0.25 MG/5ML
INTRAVENOUS | Status: AC
  Filled 2019-03-02: qty 5

## 2019-03-02 MED ORDER — SODIUM CHLORIDE 0.9% FLUSH
10.0000 mL | Freq: Once | INTRAVENOUS | Status: AC
  Administered 2019-03-02: 10:00:00 10 mL
  Filled 2019-03-02: qty 10

## 2019-03-02 MED ORDER — PALONOSETRON HCL INJECTION 0.25 MG/5ML
0.2500 mg | Freq: Once | INTRAVENOUS | Status: AC
  Administered 2019-03-02: 11:00:00 0.25 mg via INTRAVENOUS

## 2019-03-02 MED ORDER — SODIUM CHLORIDE 0.9 % IV SOLN
Freq: Once | INTRAVENOUS | Status: AC
  Administered 2019-03-02: 11:00:00 via INTRAVENOUS
  Filled 2019-03-02: qty 250

## 2019-03-02 NOTE — Progress Notes (Signed)
Hematology and Oncology Follow Up Visit  Margaret Elliott [id number] [birth year]    03/02/19   Principle Diagnosis: 68 year old woman with colon cancer diagnosed in 2014.  She was found to have stage IV disease with liver involvement.  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    Prior Therapy:  She is status post laparoscopic laparotomy right hemicolectomy with ileocolonic anastomosis done on December 09, 2012.   FOLFOX and a Avastin chemotherapy started 01/18/2013. She is S/P 12 cycles completed in 05/2013.   She is a status post microwave ablation of metastatic lesion in the posterior segment of the right lobe of the liver completed on 09/28/2014 and repeated on 11/30/2014.  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  FOLFIRI and a Avastin salvage therapy started on 12/10/2015.  Therapy discontinued and maintained on 5-FU leucovorin with a Avastin only till March 2020.  Current therapy:     FOLFIRI and a Avastin restarted on 10/05/2018.   She is here for cycle 11 of therapy.   Interim History:  Margaret Elliott returns today for a repeat evaluation.  Since the last visit, she received the last cycle of chemotherapy and had few complications related to CPT 11 She developed diarrhea and subsequently resulted in dehydration and required intravenous hydration.  Her symptoms resolved subsequent to that and has been eating and drinking better.  She denies any nausea, vomiting or abdominal pain.  She denies any dizziness or lightheadedness since that time.   Patient denied any alteration mental status, neuropathy, confusion or dizziness.  Denies any headaches or lethargy.  Denies any night sweats, weight loss or changes in appetite.  Denied orthopnea, dyspnea on exertion or chest discomfort.  Denies shortness of breath, difficulty breathing hemoptysis or  cough.  Denies any abdominal distention, nausea, early satiety or dyspepsia.  Denies any hematuria, frequency, dysuria or nocturia.  Denies any skin irritation, dryness or rash.  Denies any ecchymosis or petechiae.  Denies any lymphadenopathy or clotting.  Denies any heat or cold intolerance.  Denies any anxiety or depression.  Remaining review of system is negative.          Medications: Updated on review today. Current Outpatient Prescriptions  Medication Sig Dispense Refill  . acetaminophen (TYLENOL) 500 MG tablet Take 500 mg by mouth every 6 (six) hours as needed for pain.      . diphenhydrAMINE (BENADRYL) 25 mg capsule Take 25-50 mg by mouth daily as needed for itching. For allergic reaction      . lidocaine-prilocaine (EMLA) cream Apply 1 application topically as needed. Apply approx 1/2 tsp to skin over port, prior to chemotherapy treatments  1 kit  3  . Multiple Vitamin (MULTIVITAMIN) tablet Take 1 tablet by mouth daily.      . ondansetron (ZOFRAN) 8 MG tablet Take 1 tablet (8 mg total) by mouth every 8 (eight) hours as needed for nausea.  30 tablet  1   No current facility-administered medications for this visit.     Allergies: No Known Allergies  Past Medical History, Surgical history, Social history, and Family History without any change on review..    Physical Exam:   Blood pressure 135/78, pulse 81, temperature 98.3 F (36.8 C), temperature source Oral, resp. rate 17, height '5\' 4"'$  (1.626 m), weight 152 lb 14.4 oz (69.4 kg), SpO2 100 %.     ECOG: 0  General appearance: Alert, awake without any distress. Head: Atraumatic without abnormalities Oropharynx: Without any thrush or ulcers. Eyes: No scleral icterus. Lymph nodes: No lymphadenopathy noted in the cervical, supraclavicular, or axillary nodes Heart:regular rate and rhythm, without any murmurs or gallops.   Lung: Clear to auscultation without any rhonchi, wheezes or dullness to percussion. Abdomin:  Soft, nontender without any shifting dullness or ascites. Musculoskeletal: No clubbing or cyanosis. Neurological: No motor or sensory deficits. Skin: No rashes or lesions.           CBC    Component Value Date/Time   WBC 9.5 03/02/2019 0929   WBC 4.2 09/08/2017 0834   RBC 3.73 (L) 03/02/2019 0929   HGB 12.1 03/02/2019 0929   HGB 12.3 05/26/2017 0809   HCT 38.0 03/02/2019 0929   HCT 37.6 05/26/2017 0809   PLT 229 03/02/2019 0929   PLT 180 05/26/2017 0809   MCV 101.9 (H) 03/02/2019 0929   MCV 100.3 05/26/2017 0809   MCH 32.4 03/02/2019 0929   MCHC 31.8 03/02/2019 0929   RDW 15.8 (H) 03/02/2019 0929   RDW 17.4 (H) 05/26/2017 0809   LYMPHSABS 1.8 03/02/2019 0929   LYMPHSABS 1.2 05/26/2017 0809   MONOABS 0.7 03/02/2019 0929   MONOABS 0.5 05/26/2017 0809   EOSABS 0.1 03/02/2019 0929   EOSABS 0.1 05/26/2017 0809   BASOSABS 0.1 03/02/2019 0929   BASOSABS 0.0 05/26/2017 0809    Results for Margaret Elliott, Margaret Elliott (MRN [id number]) as of 03/02/2019 10:23  Ref. Range 01/18/2019 08:26 02/01/2019 08:49 02/15/2019 08:35  [facility] ([facility]) Latest Ref Range: 0.00 - 5.00 ng/mL 15.93 (H) 13.46 (H) 12.08 (H)    Impression and Plan:  68 year old woman with:   1.  Stage IV colon cancer diagnosed in 2014 with initially hepatic metastasis and subsequently developed peritoneal disease in 2017.     She has been receiving FOLFIRI and Avastin chemotherapy without any major complications.  She did develop worsening diarrhea and dehydration with the last cycle.  Risks and benefits of continuing this regimen was discussed today.  Alternative options would be to continue 5-FU and Avastin maintenance.  Her CEA continues to respond clinically she has benefited from this treatment.  Plan is to hold CPT-11 at this time and continue with 5-FU and a Avastin maintenance.  Plan is to repeat imaging studies in December of this year.    2.  IV access: Port-A-Cath currently remains accessed without any  issues.   3. Neuropathy: Improved at this time without any exacerbation related to current chemotherapy.  4.  Abdominal discomfort: Improved at this time with the current treatment.  Her abdominal pain is related to malignancy.  5.  Anxiety: Mood has been relatively stable at this time without any increased issues or anxieties.  6.  Prognosis: Her disease is incurable although her performance status is excellent and her response to therapy has been reasonable as well.  Aggressive measures are warranted.  7.  Diarrhea and dehydration: Related to her recent chemotherapy with CPT-11.  Holding this drug will help her symptoms..  8.  Neutropenia: She will continue to receive growth factor support after each cycle with CPT-11.  This will be withheld after CPT-11 is held.  9. Followup: In 2 weeks for the next cycle of therapy.  25 minutes spent today face-to-face with the patient.  More than 50% of today's visit was dedicated to updating her disease status, treatment options and addressing complications related therapy.  Zola Button MD 03/02/19

## 2019-03-02 NOTE — Progress Notes (Signed)
Patient does not need urine protein today, per Dr. Alen Blew order is for every 12 weeks.

## 2019-03-02 NOTE — Patient Instructions (Signed)
[facility] Discharge Instructions for Patients Receiving Chemotherapy  Today you received the following chemotherapy agents :  Avastin, , Leucovorin, Fluorouracil.  To help prevent nausea and vomiting after your treatment, we encourage you to take your nausea medication as prescribed.   If you develop nausea and vomiting that is not controlled by your nausea medication, call the clinic.   BELOW ARE SYMPTOMS THAT SHOULD BE REPORTED IMMEDIATELY:  *FEVER GREATER THAN 100.5 F  *CHILLS WITH OR WITHOUT FEVER  NAUSEA AND VOMITING THAT IS NOT CONTROLLED WITH YOUR NAUSEA MEDICATION  *UNUSUAL SHORTNESS OF BREATH  *UNUSUAL BRUISING OR BLEEDING  TENDERNESS IN MOUTH AND THROAT WITH OR WITHOUT PRESENCE OF ULCERS  *URINARY PROBLEMS  *BOWEL PROBLEMS  UNUSUAL RASH Items with * indicate a potential emergency and should be followed up as soon as possible.  Feel free to call the clinic should you have any questions or concerns. The clinic phone number is [phone].  Please show the [facility] at check-in to the Emergency Department and triage nurse.

## 2019-03-04 ENCOUNTER — Inpatient Hospital Stay: Payer: Medicare Other

## 2019-03-04 ENCOUNTER — Other Ambulatory Visit: Payer: Self-pay

## 2019-03-04 VITALS — BP 128/82 | HR 94 | Temp 98.7°F | Resp 17

## 2019-03-04 DIAGNOSIS — E860 Dehydration: Secondary | ICD-10-CM | POA: Diagnosis not present

## 2019-03-04 DIAGNOSIS — C786 Secondary malignant neoplasm of retroperitoneum and peritoneum: Secondary | ICD-10-CM | POA: Diagnosis not present

## 2019-03-04 DIAGNOSIS — C182 Malignant neoplasm of ascending colon: Secondary | ICD-10-CM | POA: Diagnosis not present

## 2019-03-04 DIAGNOSIS — R197 Diarrhea, unspecified: Secondary | ICD-10-CM | POA: Diagnosis not present

## 2019-03-04 DIAGNOSIS — K6389 Other specified diseases of intestine: Secondary | ICD-10-CM

## 2019-03-04 DIAGNOSIS — Z7689 Persons encountering health services in other specified circumstances: Secondary | ICD-10-CM | POA: Diagnosis not present

## 2019-03-04 DIAGNOSIS — C787 Secondary malignant neoplasm of liver and intrahepatic bile duct: Secondary | ICD-10-CM | POA: Diagnosis not present

## 2019-03-04 DIAGNOSIS — K769 Liver disease, unspecified: Secondary | ICD-10-CM

## 2019-03-04 DIAGNOSIS — C189 Malignant neoplasm of colon, unspecified: Secondary | ICD-10-CM

## 2019-03-04 MED ORDER — SODIUM CHLORIDE 0.9% FLUSH
10.0000 mL | INTRAVENOUS | Status: DC | PRN
  Administered 2019-03-04: 12:00:00 10 mL
  Filled 2019-03-04: qty 10

## 2019-03-04 MED ORDER — HEPARIN SOD (PORK) LOCK FLUSH 100 UNIT/ML IV SOLN
500.0000 [IU] | Freq: Once | INTRAVENOUS | Status: AC | PRN
  Administered 2019-03-04: 500 [IU]
  Filled 2019-03-04: qty 5

## 2019-03-15 ENCOUNTER — Inpatient Hospital Stay: Payer: Medicare Other

## 2019-03-15 ENCOUNTER — Inpatient Hospital Stay (HOSPITAL_BASED_OUTPATIENT_CLINIC_OR_DEPARTMENT_OTHER): Payer: Medicare Other | Admitting: Oncology

## 2019-03-15 ENCOUNTER — Other Ambulatory Visit: Payer: Self-pay

## 2019-03-15 VITALS — BP 134/80 | HR 83 | Temp 98.0°F | Resp 18 | Ht 64.0 in | Wt 150.9 lb

## 2019-03-15 DIAGNOSIS — C189 Malignant neoplasm of colon, unspecified: Secondary | ICD-10-CM

## 2019-03-15 DIAGNOSIS — R197 Diarrhea, unspecified: Secondary | ICD-10-CM | POA: Diagnosis not present

## 2019-03-15 DIAGNOSIS — Z95828 Presence of other vascular implants and grafts: Secondary | ICD-10-CM

## 2019-03-15 DIAGNOSIS — C787 Secondary malignant neoplasm of liver and intrahepatic bile duct: Secondary | ICD-10-CM | POA: Diagnosis not present

## 2019-03-15 DIAGNOSIS — C182 Malignant neoplasm of ascending colon: Secondary | ICD-10-CM | POA: Diagnosis not present

## 2019-03-15 DIAGNOSIS — C786 Secondary malignant neoplasm of retroperitoneum and peritoneum: Secondary | ICD-10-CM | POA: Diagnosis not present

## 2019-03-15 DIAGNOSIS — K6389 Other specified diseases of intestine: Secondary | ICD-10-CM

## 2019-03-15 DIAGNOSIS — K769 Liver disease, unspecified: Secondary | ICD-10-CM

## 2019-03-15 DIAGNOSIS — E860 Dehydration: Secondary | ICD-10-CM | POA: Diagnosis not present

## 2019-03-15 DIAGNOSIS — Z7689 Persons encountering health services in other specified circumstances: Secondary | ICD-10-CM | POA: Diagnosis not present

## 2019-03-15 LAB — CBC WITH DIFFERENTIAL (CANCER CENTER ONLY)
Abs Immature Granulocytes: 0.01 10*3/uL (ref 0.00–0.07)
Basophils Absolute: 0.1 10*3/uL (ref 0.0–0.1)
Basophils Relative: 1 %
Eosinophils Absolute: 0.1 10*3/uL (ref 0.0–0.5)
Eosinophils Relative: 2 %
HCT: 36.9 % (ref 36.0–46.0)
Hemoglobin: 11.8 g/dL — ABNORMAL LOW (ref 12.0–15.0)
Immature Granulocytes: 0 %
Lymphocytes Relative: 27 %
Lymphs Abs: 1.5 10*3/uL (ref 0.7–4.0)
MCH: 32.3 pg (ref 26.0–34.0)
MCHC: 32 g/dL (ref 30.0–36.0)
MCV: 101.1 fL — ABNORMAL HIGH (ref 80.0–100.0)
Monocytes Absolute: 0.7 10*3/uL (ref 0.1–1.0)
Monocytes Relative: 13 %
Neutro Abs: 3.1 10*3/uL (ref 1.7–7.7)
Neutrophils Relative %: 57 %
Platelet Count: 260 10*3/uL (ref 150–400)
RBC: 3.65 MIL/uL — ABNORMAL LOW (ref 3.87–5.11)
RDW: 15.2 % (ref 11.5–15.5)
WBC Count: 5.4 10*3/uL (ref 4.0–10.5)
nRBC: 0 % (ref 0.0–0.2)

## 2019-03-15 LAB — CMP (CANCER CENTER ONLY)
ALT: 7 U/L (ref 0–44)
AST: 14 U/L — ABNORMAL LOW (ref 15–41)
Albumin: 3.7 g/dL (ref 3.5–5.0)
Alkaline Phosphatase: 88 U/L (ref 38–126)
Anion gap: 8 (ref 5–15)
BUN: 25 mg/dL — ABNORMAL HIGH (ref 8–23)
CO2: 23 mmol/L (ref 22–32)
Calcium: 9.4 mg/dL (ref 8.9–10.3)
Chloride: 109 mmol/L (ref 98–111)
Creatinine: 1 mg/dL (ref 0.44–1.00)
GFR, Est AFR Am: >60 mL/min (ref >60)
GFR, Estimated: 57 mL/min — ABNORMAL LOW (ref >60)
Glucose, Bld: 129 mg/dL — ABNORMAL HIGH (ref 70–99)
Potassium: 3.8 mmol/L (ref 3.5–5.1)
Sodium: 140 mmol/L (ref 135–145)
Total Bilirubin: 0.7 mg/dL (ref 0.3–1.2)
Total Protein: 7.2 g/dL (ref 6.5–8.1)

## 2019-03-15 LAB — TOTAL PROTEIN, URINE DIPSTICK: Protein, ur: NEGATIVE mg/dL

## 2019-03-15 LAB — CEA (IN HOUSE-CHCC): CEA (CHCC-In House): 8.34 ng/mL — ABNORMAL HIGH (ref 0.00–5.00)

## 2019-03-15 MED ORDER — ATROPINE SULFATE 1 MG/ML IJ SOLN
INTRAMUSCULAR | Status: AC
  Filled 2019-03-15: qty 1

## 2019-03-15 MED ORDER — SODIUM CHLORIDE 0.9 % IV SOLN
Freq: Once | INTRAVENOUS | Status: AC
  Administered 2019-03-15: 09:00:00 via INTRAVENOUS
  Filled 2019-03-15: qty 250

## 2019-03-15 MED ORDER — ATROPINE SULFATE 1 MG/ML IJ SOLN
0.5000 mg | Freq: Once | INTRAMUSCULAR | Status: AC | PRN
  Administered 2019-03-15: 0.5 mg via INTRAVENOUS

## 2019-03-15 MED ORDER — SODIUM CHLORIDE 0.9 % IV SOLN
2400.0000 mg/m2 | INTRAVENOUS | Status: DC
  Administered 2019-03-15: 4250 mg via INTRAVENOUS
  Filled 2019-03-15: qty 85

## 2019-03-15 MED ORDER — LEUCOVORIN CALCIUM INJECTION 350 MG
400.0000 mg/m2 | Freq: Once | INTRAVENOUS | Status: AC
  Administered 2019-03-15: 712 mg via INTRAVENOUS
  Filled 2019-03-15: qty 35.6

## 2019-03-15 MED ORDER — SODIUM CHLORIDE 0.9 % IV SOLN
Freq: Once | INTRAVENOUS | Status: AC
  Administered 2019-03-15: 10:00:00 via INTRAVENOUS
  Filled 2019-03-15: qty 250

## 2019-03-15 MED ORDER — PALONOSETRON HCL INJECTION 0.25 MG/5ML
0.2500 mg | Freq: Once | INTRAVENOUS | Status: AC
  Administered 2019-03-15: 0.25 mg via INTRAVENOUS

## 2019-03-15 MED ORDER — DEXAMETHASONE SODIUM PHOSPHATE 10 MG/ML IJ SOLN
INTRAMUSCULAR | Status: AC
  Filled 2019-03-15: qty 1

## 2019-03-15 MED ORDER — SODIUM CHLORIDE 0.9% FLUSH
10.0000 mL | Freq: Once | INTRAVENOUS | Status: AC
  Administered 2019-03-15: 08:00:00 10 mL
  Filled 2019-03-15: qty 10

## 2019-03-15 MED ORDER — FLUOROURACIL CHEMO INJECTION 2.5 GM/50ML
400.0000 mg/m2 | Freq: Once | INTRAVENOUS | Status: AC
  Administered 2019-03-15: 700 mg via INTRAVENOUS
  Filled 2019-03-15: qty 14

## 2019-03-15 MED ORDER — IRINOTECAN HCL CHEMO INJECTION 100 MG/5ML
180.0000 mg/m2 | Freq: Once | INTRAVENOUS | Status: AC
  Administered 2019-03-15: 320 mg via INTRAVENOUS
  Filled 2019-03-15: qty 5

## 2019-03-15 MED ORDER — DEXAMETHASONE SODIUM PHOSPHATE 10 MG/ML IJ SOLN
10.0000 mg | Freq: Once | INTRAMUSCULAR | Status: AC
  Administered 2019-03-15: 10 mg via INTRAVENOUS

## 2019-03-15 MED ORDER — SODIUM CHLORIDE 0.9 % IV SOLN
5.0000 mg/kg | Freq: Once | INTRAVENOUS | Status: AC
  Administered 2019-03-15: 350 mg via INTRAVENOUS
  Filled 2019-03-15: qty 14

## 2019-03-15 MED ORDER — PALONOSETRON HCL INJECTION 0.25 MG/5ML
INTRAVENOUS | Status: AC
  Filled 2019-03-15: qty 5

## 2019-03-15 NOTE — Progress Notes (Signed)
Per Dr. Alen Blew okay to use urine protein from 7/29 for treatment today.

## 2019-03-15 NOTE — Progress Notes (Signed)
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  She underwent a repeat biopsy in 2017 with the tumor showed microsatellite stable,  NRAS mutated cancer.   Prior Therapy:  She is status post laparoscopic laparotomy right hemicolectomy with ileocolonic anastomosis done on December 09, 2012.   FOLFOX and a Avastin chemotherapy started 01/18/2013. She is S/P 12 cycles completed in 05/2013.   She is a status post microwave ablation of metastatic lesion in the posterior segment of the right lobe of the liver completed on 09/28/2014 and repeated on 11/30/2014.  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  FOLFIRI and a Avastin salvage therapy started on 12/10/2015.  Therapy discontinued and maintained on 5-FU leucovorin with a Avastin only till March 2020.  Current therapy:     FOLFIRI and a Avastin restarted on 10/05/2018.   She is here for cycle 12 of therapy.   Interim History:  Ms. Mcclusky returns today for a follow-up.  Since the last visit, he reports no major changes in her health.  She tolerated last cycle of chemotherapy without any major complaints.  She denies any nausea, vomiting or abdominal pain.  She did report to loose bowel habits but no worsening diarrhea.  She denies any worsening neuropathy or recent hospitalizations.  Performance status and quality of life remains reasonable.  She denied headaches, blurry vision, syncope or seizures.  Denies any fevers, chills or sweats.  Denied chest pain, palpitation, orthopnea or leg edema.  Denied cough, wheezing or hemoptysis.  Denied nausea, vomiting or abdominal pain.  Denies any constipation or diarrhea.  Denies any frequency urgency or hesitancy.  Denies any arthralgias or myalgias.  Denies any  skin rashes or lesions.  Denies any bleeding or clotting tendency.  Denies any easy bruising.  Denies any hair or nail changes.  Denies any anxiety or depression.  Remaining review of system is negative.           Medications: Without any changes on review. Current Outpatient Prescriptions  Medication Sig Dispense Refill  . acetaminophen (TYLENOL) 500 MG tablet Take 500 mg by mouth every 6 (six) hours as needed for pain.      . diphenhydrAMINE (BENADRYL) 25 mg capsule Take 25-50 mg by mouth daily as needed for itching. For allergic reaction      . lidocaine-prilocaine (EMLA) cream Apply 1 application topically as needed. Apply approx 1/2 tsp to skin over port, prior to chemotherapy treatments  1 kit  3  . Multiple Vitamin (MULTIVITAMIN) tablet Take 1 tablet by mouth daily.      . ondansetron (ZOFRAN) 8 MG tablet Take 1 tablet (8 mg total) by mouth every 8 (eight) hours as needed for nausea.  30 tablet  1   No current facility-administered medications for this visit.     Allergies: No Known Allergies  Past Medical History, Surgical history, Social history, and Family History updated without any changes..    Physical Exam:    Blood pressure 134/80, pulse 83, temperature 98 F (36.7 C), resp. rate 18, height 5' 4" (1.626 m), weight 150 lb 14.4 oz (68.4 kg), SpO2 100 %.     ECOG: 0   General appearance: Comfortable appearing without any discomfort Head: Normocephalic without any trauma Oropharynx: Mucous membranes are moist and  pink without any thrush or ulcers. Eyes: Pupils are equal and round reactive to light. Lymph nodes: No cervical, supraclavicular, inguinal or axillary lymphadenopathy.   Heart:regular rate and rhythm.  S1 and S2 without leg edema. Lung: Clear without any rhonchi or wheezes.  No dullness to percussion. Abdomin: Soft, nontender, nondistended with good bowel sounds.  No hepatosplenomegaly. Musculoskeletal: No joint deformity or effusion.  Full range  of motion noted. Neurological: No deficits noted on motor, sensory and deep tendon reflex exam. Skin: No petechial rash or dryness.  Appeared moist.             CBC    Component Value Date/Time   WBC 9.5 03/02/2019 0929   WBC 4.2 09/08/2017 0834   RBC 3.73 (L) 03/02/2019 0929   HGB 12.1 03/02/2019 0929   HGB 12.3 05/26/2017 0809   HCT 38.0 03/02/2019 0929   HCT 37.6 05/26/2017 0809   PLT 229 03/02/2019 0929   PLT 180 05/26/2017 0809   MCV 101.9 (H) 03/02/2019 0929   MCV 100.3 05/26/2017 0809   MCH 32.4 03/02/2019 0929   MCHC 31.8 03/02/2019 0929   RDW 15.8 (H) 03/02/2019 0929   RDW 17.4 (H) 05/26/2017 0809   LYMPHSABS 1.8 03/02/2019 0929   LYMPHSABS 1.2 05/26/2017 0809   MONOABS 0.7 03/02/2019 0929   MONOABS 0.5 05/26/2017 0809   EOSABS 0.1 03/02/2019 0929   EOSABS 0.1 05/26/2017 0809   BASOSABS 0.1 03/02/2019 0929   BASOSABS 0.0 05/26/2017 0809    Results for SHANTAVIA, JHA (MRN [id number]) as of 03/15/2019 08:19  Ref. Range 02/15/2019 08:35 03/02/2019 09:29  CEA ([facility]) Latest Ref Range: 0.00 - 5.00 ng/mL 12.08 (H) 11.23 (H)     Impression and Plan:  68 year old woman with:   1.  Colon cancer diagnosed in 2014 with documented liver disease and subsequently developed peritoneal metastasis in 2017.   She is currently on FOLFIRI and Avastin with reasonable response to therapy and completed 11 cycles.  Her CEA continues to show reasonable response and imaging studies in June 2020 showed objective response.  Risks and benefits of continuing this current approach versus switching to maintenance 5-FU and a Avastin was discussed.  The plan is to proceed with cycle 12 today without any dose reduction or delay.  Will consider proceeding with 5-FU, leucovorin and Avastin maintenance starting on 03/29/2019.    2.  IV access: Port-A-Cath remains in use without any issues.   3. 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  No exacerbation noted.  4.   Abdominal discomfort: Related to peritoneal implants and pain is manageable without any recent exacerbation.  5.  Anxiety: She does use Ativan as needed with mood relatively stable.  6.  Prognosis: Therapy remains palliative although aggressive measures are warranted given her excellent performance status.  7.  Diarrhea and dehydration: Resolved at this time.  8.  Neutropenia: Related to chemotherapy and she will use growth factor support after each cycle with CPT-11.  This will be discontinued with maintenance 5-FU and leucovorin.  9. Followup: Will be in 2 weeks for the next cycle of therapy.  25 minutes spent today face-to-face with the patient.  More than 50% of today's visit was spent on updating her disease status, treatment options and addressing complications related to therapy.  Zola Button MD 03/15/19

## 2019-03-15 NOTE — Patient Instructions (Signed)
[facility] Discharge Instructions for Patients Receiving Chemotherapy  Today you received the following chemotherapy agents: bevacizumab, leucovorin, irinotecan, 5FU  To help prevent nausea and vomiting after your treatment, we encourage you to take your nausea medication as directed.   If you develop nausea and vomiting that is not controlled by your nausea medication, call the clinic.   BELOW ARE SYMPTOMS THAT SHOULD BE REPORTED IMMEDIATELY:  *FEVER GREATER THAN 100.5 F  *CHILLS WITH OR WITHOUT FEVER  NAUSEA AND VOMITING THAT IS NOT CONTROLLED WITH YOUR NAUSEA MEDICATION  *UNUSUAL SHORTNESS OF BREATH  *UNUSUAL BRUISING OR BLEEDING  TENDERNESS IN MOUTH AND THROAT WITH OR WITHOUT PRESENCE OF ULCERS  *URINARY PROBLEMS  *BOWEL PROBLEMS  UNUSUAL RASH Items with * indicate a potential emergency and should be followed up as soon as possible.  Feel free to call the clinic should you have any questions or concerns. The clinic phone number is [phone].  Please show the [facility] at check-in to the Emergency Department and triage nurse.

## 2019-03-17 ENCOUNTER — Inpatient Hospital Stay: Payer: Medicare Other

## 2019-03-17 ENCOUNTER — Other Ambulatory Visit: Payer: Self-pay

## 2019-03-17 VITALS — BP 106/66 | HR 70 | Temp 98.5°F | Resp 18

## 2019-03-17 DIAGNOSIS — Z95828 Presence of other vascular implants and grafts: Secondary | ICD-10-CM

## 2019-03-17 DIAGNOSIS — K6389 Other specified diseases of intestine: Secondary | ICD-10-CM

## 2019-03-17 DIAGNOSIS — C189 Malignant neoplasm of colon, unspecified: Secondary | ICD-10-CM

## 2019-03-17 DIAGNOSIS — C787 Secondary malignant neoplasm of liver and intrahepatic bile duct: Secondary | ICD-10-CM | POA: Diagnosis not present

## 2019-03-17 DIAGNOSIS — C786 Secondary malignant neoplasm of retroperitoneum and peritoneum: Secondary | ICD-10-CM | POA: Diagnosis not present

## 2019-03-17 DIAGNOSIS — R197 Diarrhea, unspecified: Secondary | ICD-10-CM | POA: Diagnosis not present

## 2019-03-17 DIAGNOSIS — K769 Liver disease, unspecified: Secondary | ICD-10-CM

## 2019-03-17 DIAGNOSIS — E860 Dehydration: Secondary | ICD-10-CM | POA: Diagnosis not present

## 2019-03-17 DIAGNOSIS — Z7689 Persons encountering health services in other specified circumstances: Secondary | ICD-10-CM | POA: Diagnosis not present

## 2019-03-17 DIAGNOSIS — C182 Malignant neoplasm of ascending colon: Secondary | ICD-10-CM | POA: Diagnosis not present

## 2019-03-17 MED ORDER — PEGFILGRASTIM-CBQV 6 MG/0.6ML ~~LOC~~ SOSY
6.0000 mg | PREFILLED_SYRINGE | Freq: Once | SUBCUTANEOUS | Status: AC
  Administered 2019-03-17: 6 mg via SUBCUTANEOUS

## 2019-03-17 MED ORDER — PEGFILGRASTIM-CBQV 6 MG/0.6ML ~~LOC~~ SOSY
PREFILLED_SYRINGE | SUBCUTANEOUS | Status: AC
  Filled 2019-03-17: qty 0.6

## 2019-03-17 MED ORDER — SODIUM CHLORIDE 0.9% FLUSH
10.0000 mL | Freq: Once | INTRAVENOUS | Status: DC
  Filled 2019-03-17: qty 10

## 2019-03-17 MED ORDER — HEPARIN SOD (PORK) LOCK FLUSH 100 UNIT/ML IV SOLN
500.0000 [IU] | Freq: Once | INTRAVENOUS | Status: DC | PRN
  Filled 2019-03-17: qty 5

## 2019-03-17 NOTE — Patient Instructions (Signed)

## 2019-03-29 ENCOUNTER — Other Ambulatory Visit: Payer: Self-pay

## 2019-03-29 ENCOUNTER — Inpatient Hospital Stay: Payer: Medicare Other | Attending: Oncology | Admitting: Oncology

## 2019-03-29 ENCOUNTER — Inpatient Hospital Stay: Payer: Medicare Other

## 2019-03-29 VITALS — BP 132/84 | HR 95 | Temp 98.9°F | Resp 18 | Ht 64.0 in | Wt 149.2 lb

## 2019-03-29 DIAGNOSIS — R110 Nausea: Secondary | ICD-10-CM | POA: Diagnosis not present

## 2019-03-29 DIAGNOSIS — C189 Malignant neoplasm of colon, unspecified: Secondary | ICD-10-CM

## 2019-03-29 DIAGNOSIS — Z5111 Encounter for antineoplastic chemotherapy: Secondary | ICD-10-CM | POA: Diagnosis not present

## 2019-03-29 DIAGNOSIS — R197 Diarrhea, unspecified: Secondary | ICD-10-CM | POA: Diagnosis not present

## 2019-03-29 DIAGNOSIS — T451X5A Adverse effect of antineoplastic and immunosuppressive drugs, initial encounter: Secondary | ICD-10-CM | POA: Insufficient documentation

## 2019-03-29 DIAGNOSIS — F419 Anxiety disorder, unspecified: Secondary | ICD-10-CM | POA: Diagnosis not present

## 2019-03-29 DIAGNOSIS — R634 Abnormal weight loss: Secondary | ICD-10-CM | POA: Diagnosis not present

## 2019-03-29 DIAGNOSIS — G893 Neoplasm related pain (acute) (chronic): Secondary | ICD-10-CM | POA: Diagnosis not present

## 2019-03-29 DIAGNOSIS — K1230 Oral mucositis (ulcerative), unspecified: Secondary | ICD-10-CM | POA: Diagnosis not present

## 2019-03-29 DIAGNOSIS — G620 Drug-induced polyneuropathy: Secondary | ICD-10-CM | POA: Diagnosis not present

## 2019-03-29 DIAGNOSIS — C787 Secondary malignant neoplasm of liver and intrahepatic bile duct: Secondary | ICD-10-CM

## 2019-03-29 DIAGNOSIS — R5383 Other fatigue: Secondary | ICD-10-CM | POA: Insufficient documentation

## 2019-03-29 DIAGNOSIS — Z95828 Presence of other vascular implants and grafts: Secondary | ICD-10-CM

## 2019-03-29 DIAGNOSIS — E860 Dehydration: Secondary | ICD-10-CM | POA: Insufficient documentation

## 2019-03-29 DIAGNOSIS — K6389 Other specified diseases of intestine: Secondary | ICD-10-CM

## 2019-03-29 DIAGNOSIS — Z5112 Encounter for antineoplastic immunotherapy: Secondary | ICD-10-CM | POA: Diagnosis not present

## 2019-03-29 DIAGNOSIS — Z79899 Other long term (current) drug therapy: Secondary | ICD-10-CM | POA: Diagnosis not present

## 2019-03-29 DIAGNOSIS — C786 Secondary malignant neoplasm of retroperitoneum and peritoneum: Secondary | ICD-10-CM | POA: Insufficient documentation

## 2019-03-29 DIAGNOSIS — K769 Liver disease, unspecified: Secondary | ICD-10-CM

## 2019-03-29 LAB — CMP (CANCER CENTER ONLY)
ALT: 11 U/L (ref 0–44)
AST: 17 U/L (ref 15–41)
Albumin: 3.4 g/dL — ABNORMAL LOW (ref 3.5–5.0)
Alkaline Phosphatase: 116 U/L (ref 38–126)
Anion gap: 8 (ref 5–15)
BUN: 14 mg/dL (ref 8–23)
CO2: 25 mmol/L (ref 22–32)
Calcium: 8.8 mg/dL — ABNORMAL LOW (ref 8.9–10.3)
Chloride: 109 mmol/L (ref 98–111)
Creatinine: 1.05 mg/dL — ABNORMAL HIGH (ref 0.44–1.00)
GFR, Est AFR Am: >60 mL/min (ref >60)
GFR, Estimated: 54 mL/min — ABNORMAL LOW (ref >60)
Glucose, Bld: 103 mg/dL — ABNORMAL HIGH (ref 70–99)
Potassium: 3.5 mmol/L (ref 3.5–5.1)
Sodium: 142 mmol/L (ref 135–145)
Total Bilirubin: 0.3 mg/dL (ref 0.3–1.2)
Total Protein: 6.9 g/dL (ref 6.5–8.1)

## 2019-03-29 LAB — CBC WITH DIFFERENTIAL (CANCER CENTER ONLY)
Abs Immature Granulocytes: 0.25 10*3/uL — ABNORMAL HIGH (ref 0.00–0.07)
Basophils Absolute: 0.1 10*3/uL (ref 0.0–0.1)
Basophils Relative: 1 %
Eosinophils Absolute: 0.2 10*3/uL (ref 0.0–0.5)
Eosinophils Relative: 2 %
HCT: 35.3 % — ABNORMAL LOW (ref 36.0–46.0)
Hemoglobin: 11.5 g/dL — ABNORMAL LOW (ref 12.0–15.0)
Immature Granulocytes: 2 %
Lymphocytes Relative: 15 %
Lymphs Abs: 1.8 10*3/uL (ref 0.7–4.0)
MCH: 32.5 pg (ref 26.0–34.0)
MCHC: 32.6 g/dL (ref 30.0–36.0)
MCV: 99.7 fL (ref 80.0–100.0)
Monocytes Absolute: 0.7 10*3/uL (ref 0.1–1.0)
Monocytes Relative: 6 %
Neutro Abs: 8.9 10*3/uL — ABNORMAL HIGH (ref 1.7–7.7)
Neutrophils Relative %: 74 %
Platelet Count: 190 10*3/uL (ref 150–400)
RBC: 3.54 MIL/uL — ABNORMAL LOW (ref 3.87–5.11)
RDW: 15.6 % — ABNORMAL HIGH (ref 11.5–15.5)
WBC Count: 11.9 10*3/uL — ABNORMAL HIGH (ref 4.0–10.5)
nRBC: 0 % (ref 0.0–0.2)

## 2019-03-29 LAB — CEA (IN HOUSE-CHCC): CEA (CHCC-In House): 5.5 ng/mL — ABNORMAL HIGH (ref 0.00–5.00)

## 2019-03-29 MED ORDER — SODIUM CHLORIDE 0.9 % IV SOLN
2400.0000 mg/m2 | INTRAVENOUS | Status: DC
  Administered 2019-03-29: 13:00:00 4250 mg via INTRAVENOUS
  Filled 2019-03-29: qty 85

## 2019-03-29 MED ORDER — LEUCOVORIN CALCIUM INJECTION 350 MG
400.0000 mg/m2 | Freq: Once | INTRAVENOUS | Status: AC
  Administered 2019-03-29: 11:00:00 712 mg via INTRAVENOUS
  Filled 2019-03-29: qty 35.6

## 2019-03-29 MED ORDER — SODIUM CHLORIDE 0.9 % IV SOLN
Freq: Once | INTRAVENOUS | Status: AC
  Administered 2019-03-29: 10:00:00 via INTRAVENOUS
  Filled 2019-03-29: qty 250

## 2019-03-29 MED ORDER — PROCHLORPERAZINE MALEATE 10 MG PO TABS
ORAL_TABLET | ORAL | Status: AC
  Filled 2019-03-29: qty 1

## 2019-03-29 MED ORDER — ATROPINE SULFATE 1 MG/ML IJ SOLN: 0.5000 mg | Freq: Once | INTRAMUSCULAR | Status: DC | PRN

## 2019-03-29 MED ORDER — FLUOROURACIL CHEMO INJECTION 2.5 GM/50ML
400.0000 mg/m2 | Freq: Once | INTRAVENOUS | Status: AC
  Administered 2019-03-29: 13:00:00 700 mg via INTRAVENOUS
  Filled 2019-03-29: qty 14

## 2019-03-29 MED ORDER — PROCHLORPERAZINE MALEATE 10 MG PO TABS
10.0000 mg | ORAL_TABLET | Freq: Once | ORAL | Status: AC
  Administered 2019-03-29: 10 mg via ORAL

## 2019-03-29 MED ORDER — SODIUM CHLORIDE 0.9 % IV SOLN
5.0000 mg/kg | Freq: Once | INTRAVENOUS | Status: AC
  Administered 2019-03-29: 11:00:00 350 mg via INTRAVENOUS
  Filled 2019-03-29: qty 14

## 2019-03-29 MED ORDER — SODIUM CHLORIDE 0.9% FLUSH
10.0000 mL | Freq: Once | INTRAVENOUS | Status: AC
  Administered 2019-03-29: 10 mL
  Filled 2019-03-29: qty 10

## 2019-03-29 MED ORDER — ATROPINE SULFATE 1 MG/ML IJ SOLN
INTRAMUSCULAR | Status: AC
  Filled 2019-03-29: qty 1

## 2019-03-29 MED ORDER — MAGIC MOUTHWASH W/LIDOCAINE: 5.0000 mL | Freq: Three times a day (TID) | ORAL | 3 refills | Status: DC | PRN

## 2019-03-29 NOTE — Patient Instructions (Signed)
[facility] Discharge Instructions for Patients Receiving Chemotherapy  Today you received the following chemotherapy agents: avastin, leucovorin, 5FU   To help prevent nausea and vomiting after your treatment, we encourage you to take your nausea medication as directed.    If you develop nausea and vomiting that is not controlled by your nausea medication, call the clinic.   BELOW ARE SYMPTOMS THAT SHOULD BE REPORTED IMMEDIATELY:  *FEVER GREATER THAN 100.5 F  *CHILLS WITH OR WITHOUT FEVER  NAUSEA AND VOMITING THAT IS NOT CONTROLLED WITH YOUR NAUSEA MEDICATION  *UNUSUAL SHORTNESS OF BREATH  *UNUSUAL BRUISING OR BLEEDING  TENDERNESS IN MOUTH AND THROAT WITH OR WITHOUT PRESENCE OF ULCERS  *URINARY PROBLEMS  *BOWEL PROBLEMS  UNUSUAL RASH Items with * indicate a potential emergency and should be followed up as soon as possible.  Feel free to call the clinic should you have any questions or concerns. The clinic phone number is [phone].  Please show the [facility] at check-in to the Emergency Department and triage nurse.

## 2019-03-29 NOTE — Progress Notes (Signed)
Hematology and Oncology Follow Up Visit  Margaret Elliott [id number] 1950/12/12    03/29/19   Principle Diagnosis: 68 year old woman with colon cancer diagnosed in 2014.  She presented with stage IV and hepatic involvement and subsequently developed peritoneal with a tumor that is microsatellite stable,  NRAS mutated in 2017.   Prior Therapy:  She is status post laparoscopic laparotomy right hemicolectomy with ileocolonic anastomosis done on December 09, 2012.   FOLFOX and a Avastin chemotherapy started 01/18/2013. She is S/P 12 cycles completed in 05/2013.   She is a status post microwave ablation of metastatic lesion in the posterior segment of the right lobe of the liver completed on 09/28/2014 and repeated on 11/30/2014.  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  FOLFIRI and a Avastin salvage therapy started on 12/10/2015.  Therapy discontinued and maintained on 5-FU leucovorin with a Avastin only till March 2020.  Current therapy:     FOLFIRI and a Avastin restarted on 10/05/2018.   She is status post 12 cycles of therapy.  She is here for to start maintenance 5-FU, leucovorin and a Avastin maintenance.   Interim History:  Margaret Elliott is here for return evaluation.  Since last visit, she reports more GI toxicities associated with CPT-11.  She reported nausea and fatigue as well as weight loss since the last visit.  She has also reported mouth pain which has impacted her ability to swallow.  She denies any recent hospitalizations or illnesses.  She denies any abdominal pain or worsening diarrhea.  She does report loose bowel habits after chemotherapy.   Patient denied any alteration mental status, neuropathy, confusion or dizziness.  Denies any headaches or lethargy.  Denies any night sweats, weight loss or changes in appetite.  Denied orthopnea, dyspnea on exertion or chest discomfort.  Denies shortness of  breath, difficulty breathing hemoptysis or cough.  Denies any abdominal distention, nausea, early satiety or dyspepsia.  Denies any hematuria, frequency, dysuria or nocturia.  Denies any skin irritation, dryness or rash.  Denies any ecchymosis or petechiae.  Denies any lymphadenopathy or clotting.  Denies any heat or cold intolerance.  Denies any anxiety or depression.  Remaining review of system is negative.       Medications: Updated without changes. Current Outpatient Prescriptions  Medication Sig Dispense Refill  . acetaminophen (TYLENOL) 500 MG tablet Take 500 mg by mouth every 6 (six) hours as needed for pain.      . diphenhydrAMINE (BENADRYL) 25 mg capsule Take 25-50 mg by mouth daily as needed for itching. For allergic reaction      . lidocaine-prilocaine (EMLA) cream Apply 1 application topically as needed. Apply approx 1/2 tsp to skin over port, prior to chemotherapy treatments  1 kit  3  . Multiple Vitamin (MULTIVITAMIN) tablet Take 1 tablet by mouth daily.      . ondansetron (ZOFRAN) 8 MG tablet Take 1 tablet (8 mg total) by mouth every 8 (eight) hours as needed for nausea.  30 tablet  1   No current facility-administered medications for this visit.     Allergies: No Known Allergies  Past Medical History, Surgical history, Social history, and Family History reviewed without any changes.    Physical Exam:  Blood pressure 132/84, pulse 95, temperature 98.9 F (37.2 C), temperature source Oral, resp. rate 18, height _0  (1.626 m), weight 149 lb 3.2 oz (67.7 kg), SpO2 100 %.  ECOG: 0     General appearance: Alert, awake without any distress. Head: Atraumatic without abnormalities Oropharynx: Without any thrush or ulcers. Eyes: No scleral icterus. Lymph nodes: No lymphadenopathy noted in the cervical, supraclavicular, or axillary nodes Heart:regular rate and rhythm, without any murmurs or gallops.   Lung: Clear to auscultation without any rhonchi, wheezes or  dullness to percussion. Abdomin: Soft, nontender without any shifting dullness or ascites. Musculoskeletal: No clubbing or cyanosis. Neurological: No motor or sensory deficits. Skin: No rashes or lesions. Psychiatric: Mood and affect appeared normal.             CBC    Component Value Date/Time   WBC 5.4 03/15/2019 0756   WBC 4.2 09/08/2017 0834   RBC 3.65 (L) 03/15/2019 0756   HGB 11.8 (L) 03/15/2019 0756   HGB 12.3 05/26/2017 0809   HCT 36.9 03/15/2019 0756   HCT 37.6 05/26/2017 0809   PLT 260 03/15/2019 0756   PLT 180 05/26/2017 0809   MCV 101.1 (H) 03/15/2019 0756   MCV 100.3 05/26/2017 0809   MCH 32.3 03/15/2019 0756   MCHC 32.0 03/15/2019 0756   RDW 15.2 03/15/2019 0756   RDW 17.4 (H) 05/26/2017 0809   LYMPHSABS 1.5 03/15/2019 0756   LYMPHSABS 1.2 05/26/2017 0809   MONOABS 0.7 03/15/2019 0756   MONOABS 0.5 05/26/2017 0809   EOSABS 0.1 03/15/2019 0756   EOSABS 0.1 05/26/2017 0809   BASOSABS 0.1 03/15/2019 0756   BASOSABS 0.0 05/26/2017 0809    Results for Margaret Elliott, Margaret Elliott (MRN [id number]) as of 03/29/2019 09:13  Ref. Range 03/02/2019 09:29 03/15/2019 07:56  CEA ([facility]) Latest Ref Range: 0.00 - 5.00 ng/mL 11.23 (H) 8.34 (H)      Impression and Plan:  68 year old woman with:   1.  Stage IV colon cancer with peritoneal involvement and initially hepatic involvement diagnosed in 2014.   He is status post therapy outlined above and currently completed 12 cycles of FOLFIRI and Avastin.  The plan is to switch to maintenance of 5-FU, leucovorin and Avastin given her excellent response.  Her CEA continues to decline indicating positive response to therapy.  I anticipate repeating imaging studies in December 2020.    2.  IV access: Port-A-Cath continues to be in use for chemotherapy without complications.   3. Neuropathy: No recent exacerbation worsening at this time.  This is related to her previous chemotherapy exposure.  4.  Abdominal discomfort:  Related to her malignancy appears to be stable overall.  5.  Anxiety: She has no exacerbations related to her mood at this time.  She is currently on Ativan.  6.  Prognosis: Her disease remains incurable although aggressive measures are warranted at this time.  7.  Diarrhea and dehydration: Related to CPT-11 which will be eliminated moving forward.  8.  Neutropenia: Related to CPT-11 and eliminating the will likely does not require any growth factor support.  9.  Mouth pain and mucositis: Prescription for Magic mouthwash will be available to her.  10. Followup: In 2 weeks for repeat evaluation prior to her next maintenance chemotherapy cycle.  25 minutes spent today face-to-face with the patient.  More than 50% of today's visit was dedicated to updating her disease status, treatment options and addressing complications related therapy.  Zola Button MD 03/29/19

## 2019-03-29 NOTE — Addendum Note (Signed)
Addended by: Wyatt Portela on: 03/29/2019 09:31 AM   Modules accepted: Orders

## 2019-03-29 NOTE — Progress Notes (Signed)
Spoke with Dr. Alen Blew, he is removing irinotecan from today's treatment and future treatments. Prochlorperazine only as premedication and no further growth factor support.   Demetrius Charity, PharmD, [facility] Pharmacist Pharmacy Phone: [phone] 03/29/2019

## 2019-03-31 ENCOUNTER — Other Ambulatory Visit: Payer: Self-pay

## 2019-03-31 ENCOUNTER — Inpatient Hospital Stay: Payer: Medicare Other

## 2019-03-31 VITALS — BP 128/72 | HR 88 | Temp 98.2°F | Resp 18

## 2019-03-31 DIAGNOSIS — C786 Secondary malignant neoplasm of retroperitoneum and peritoneum: Secondary | ICD-10-CM | POA: Diagnosis not present

## 2019-03-31 DIAGNOSIS — G620 Drug-induced polyneuropathy: Secondary | ICD-10-CM | POA: Diagnosis not present

## 2019-03-31 DIAGNOSIS — Z5112 Encounter for antineoplastic immunotherapy: Secondary | ICD-10-CM | POA: Diagnosis not present

## 2019-03-31 DIAGNOSIS — K769 Liver disease, unspecified: Secondary | ICD-10-CM

## 2019-03-31 DIAGNOSIS — C189 Malignant neoplasm of colon, unspecified: Secondary | ICD-10-CM | POA: Diagnosis not present

## 2019-03-31 DIAGNOSIS — K6389 Other specified diseases of intestine: Secondary | ICD-10-CM

## 2019-03-31 DIAGNOSIS — T451X5A Adverse effect of antineoplastic and immunosuppressive drugs, initial encounter: Secondary | ICD-10-CM | POA: Diagnosis not present

## 2019-03-31 DIAGNOSIS — Z5111 Encounter for antineoplastic chemotherapy: Secondary | ICD-10-CM | POA: Diagnosis not present

## 2019-03-31 MED ORDER — HEPARIN SOD (PORK) LOCK FLUSH 100 UNIT/ML IV SOLN
500.0000 [IU] | Freq: Once | INTRAVENOUS | Status: AC | PRN
  Administered 2019-03-31: 12:00:00 500 [IU]
  Filled 2019-03-31: qty 5

## 2019-03-31 MED ORDER — SODIUM CHLORIDE 0.9% FLUSH
10.0000 mL | INTRAVENOUS | Status: DC | PRN
  Administered 2019-03-31: 12:00:00 10 mL
  Filled 2019-03-31: qty 10

## 2019-04-13 ENCOUNTER — Inpatient Hospital Stay: Payer: Medicare Other

## 2019-04-13 ENCOUNTER — Inpatient Hospital Stay (HOSPITAL_BASED_OUTPATIENT_CLINIC_OR_DEPARTMENT_OTHER): Payer: Medicare Other | Admitting: Oncology

## 2019-04-13 ENCOUNTER — Other Ambulatory Visit: Payer: Self-pay

## 2019-04-13 VITALS — BP 155/84 | HR 80 | Temp 98.0°F | Resp 18 | Ht 64.0 in | Wt 148.7 lb

## 2019-04-13 VITALS — BP 127/75 | HR 71

## 2019-04-13 DIAGNOSIS — C189 Malignant neoplasm of colon, unspecified: Secondary | ICD-10-CM

## 2019-04-13 DIAGNOSIS — K6389 Other specified diseases of intestine: Secondary | ICD-10-CM

## 2019-04-13 DIAGNOSIS — C786 Secondary malignant neoplasm of retroperitoneum and peritoneum: Secondary | ICD-10-CM | POA: Diagnosis not present

## 2019-04-13 DIAGNOSIS — T451X5A Adverse effect of antineoplastic and immunosuppressive drugs, initial encounter: Secondary | ICD-10-CM | POA: Diagnosis not present

## 2019-04-13 DIAGNOSIS — G620 Drug-induced polyneuropathy: Secondary | ICD-10-CM | POA: Diagnosis not present

## 2019-04-13 DIAGNOSIS — K769 Liver disease, unspecified: Secondary | ICD-10-CM

## 2019-04-13 DIAGNOSIS — Z5112 Encounter for antineoplastic immunotherapy: Secondary | ICD-10-CM | POA: Diagnosis not present

## 2019-04-13 DIAGNOSIS — Z5111 Encounter for antineoplastic chemotherapy: Secondary | ICD-10-CM | POA: Diagnosis not present

## 2019-04-13 DIAGNOSIS — Z95828 Presence of other vascular implants and grafts: Secondary | ICD-10-CM

## 2019-04-13 DIAGNOSIS — C787 Secondary malignant neoplasm of liver and intrahepatic bile duct: Secondary | ICD-10-CM

## 2019-04-13 LAB — CMP (CANCER CENTER ONLY)
ALT: 9 U/L (ref 0–44)
AST: 15 U/L (ref 15–41)
Albumin: 3.4 g/dL — ABNORMAL LOW (ref 3.5–5.0)
Alkaline Phosphatase: 72 U/L (ref 38–126)
Anion gap: 8 (ref 5–15)
BUN: 14 mg/dL (ref 8–23)
CO2: 23 mmol/L (ref 22–32)
Calcium: 9.2 mg/dL (ref 8.9–10.3)
Chloride: 110 mmol/L (ref 98–111)
Creatinine: 0.96 mg/dL (ref 0.44–1.00)
GFR, Est AFR Am: >60 mL/min (ref >60)
GFR, Estimated: >60 mL/min (ref >60)
Glucose, Bld: 96 mg/dL (ref 70–99)
Potassium: 3.9 mmol/L (ref 3.5–5.1)
Sodium: 141 mmol/L (ref 135–145)
Total Bilirubin: 1.1 mg/dL (ref 0.3–1.2)
Total Protein: 6.9 g/dL (ref 6.5–8.1)

## 2019-04-13 LAB — CBC WITH DIFFERENTIAL (CANCER CENTER ONLY)
Abs Immature Granulocytes: 0.01 10*3/uL (ref 0.00–0.07)
Basophils Absolute: 0.1 10*3/uL (ref 0.0–0.1)
Basophils Relative: 1 %
Eosinophils Absolute: 0.1 10*3/uL (ref 0.0–0.5)
Eosinophils Relative: 2 %
HCT: 36.4 % (ref 36.0–46.0)
Hemoglobin: 11.9 g/dL — ABNORMAL LOW (ref 12.0–15.0)
Immature Granulocytes: 0 %
Lymphocytes Relative: 22 %
Lymphs Abs: 1 10*3/uL (ref 0.7–4.0)
MCH: 31.9 pg (ref 26.0–34.0)
MCHC: 32.7 g/dL (ref 30.0–36.0)
MCV: 97.6 fL (ref 80.0–100.0)
Monocytes Absolute: 0.7 10*3/uL (ref 0.1–1.0)
Monocytes Relative: 15 %
Neutro Abs: 2.8 10*3/uL (ref 1.7–7.7)
Neutrophils Relative %: 60 %
Platelet Count: 220 10*3/uL (ref 150–400)
RBC: 3.73 MIL/uL — ABNORMAL LOW (ref 3.87–5.11)
RDW: 15.6 % — ABNORMAL HIGH (ref 11.5–15.5)
WBC Count: 4.7 10*3/uL (ref 4.0–10.5)
nRBC: 0 % (ref 0.0–0.2)

## 2019-04-13 LAB — CEA (IN HOUSE-CHCC): CEA (CHCC-In House): 6.42 ng/mL — ABNORMAL HIGH (ref 0.00–5.00)

## 2019-04-13 MED ORDER — SODIUM CHLORIDE 0.9% FLUSH
10.0000 mL | INTRAVENOUS | Status: DC | PRN
  Filled 2019-04-13: qty 10

## 2019-04-13 MED ORDER — PROCHLORPERAZINE MALEATE 10 MG PO TABS
ORAL_TABLET | ORAL | Status: AC
  Filled 2019-04-13: qty 1

## 2019-04-13 MED ORDER — LEUCOVORIN CALCIUM INJECTION 350 MG
400.0000 mg/m2 | Freq: Once | INTRAVENOUS | Status: AC
  Administered 2019-04-13: 712 mg via INTRAVENOUS
  Filled 2019-04-13: qty 35.6

## 2019-04-13 MED ORDER — HEPARIN SOD (PORK) LOCK FLUSH 100 UNIT/ML IV SOLN
500.0000 [IU] | Freq: Once | INTRAVENOUS | Status: DC | PRN
  Filled 2019-04-13: qty 5

## 2019-04-13 MED ORDER — SODIUM CHLORIDE 0.9 % IV SOLN
5.0000 mg/kg | Freq: Once | INTRAVENOUS | Status: AC
  Administered 2019-04-13: 350 mg via INTRAVENOUS
  Filled 2019-04-13: qty 14

## 2019-04-13 MED ORDER — PROCHLORPERAZINE MALEATE 10 MG PO TABS
10.0000 mg | ORAL_TABLET | Freq: Four times a day (QID) | ORAL | Status: DC | PRN
  Administered 2019-04-13: 10 mg via ORAL

## 2019-04-13 MED ORDER — FLUOROURACIL CHEMO INJECTION 2.5 GM/50ML
400.0000 mg/m2 | Freq: Once | INTRAVENOUS | Status: AC
  Administered 2019-04-13: 700 mg via INTRAVENOUS
  Filled 2019-04-13: qty 14

## 2019-04-13 MED ORDER — SODIUM CHLORIDE 0.9 % IV SOLN
2400.0000 mg/m2 | INTRAVENOUS | Status: DC
  Administered 2019-04-13: 4250 mg via INTRAVENOUS
  Filled 2019-04-13: qty 85

## 2019-04-13 MED ORDER — SODIUM CHLORIDE 0.9% FLUSH
10.0000 mL | Freq: Once | INTRAVENOUS | Status: AC
  Administered 2019-04-13: 10 mL
  Filled 2019-04-13: qty 10

## 2019-04-13 MED ORDER — SODIUM CHLORIDE 0.9 % IV SOLN
Freq: Once | INTRAVENOUS | Status: AC
  Administered 2019-04-13: 09:00:00 via INTRAVENOUS
  Filled 2019-04-13: qty 250

## 2019-04-13 MED ORDER — SODIUM CHLORIDE 0.9 % IV SOLN
Freq: Once | INTRAVENOUS | Status: DC
  Filled 2019-04-13: qty 250

## 2019-04-13 NOTE — Progress Notes (Signed)
Hematology and Oncology Follow Up Visit  Margaret Elliott [id number] 13-Jul-1950    04/13/19   Principle Diagnosis: 68 year old woman with stage IV colon cancer with the hepatic metastasis initially and subsequently peritoneal involvement diagnosed in 2014.  Biopsy obtained in 2017 showed microsatellite stable,  NRAS mutated peritoneal recurrent tumor.   Prior Therapy:  She is status post laparoscopic laparotomy right hemicolectomy with ileocolonic anastomosis done on December 09, 2012.   FOLFOX and a Avastin chemotherapy started 01/18/2013. She is S/P 12 cycles completed in 05/2013.   She is a status post microwave ablation of metastatic lesion in the posterior segment of the right lobe of the liver completed on 09/28/2014 and repeated on 11/30/2014.  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  FOLFIRI and a Avastin salvage therapy started on 12/10/2015.  Therapy discontinued and maintained on 5-FU leucovorin with a Avastin only till March 2020.  FOLFIRI and a Avastin restarted on 10/05/2018.   She is status post 12 cycles of therapy.    Current therapy:     She is currently receiving maintenance 5-FU, leucovorin and a Avastin maintenance restarted on October 7 of 2020.  She is here for the next cycle of therapy.   Interim History:  Margaret Elliott presents today for a follow-up.  Since her last visit, she continues to tolerate chemotherapy without any major complications.  She denies any nausea, abdominal pain or diarrhea after irinotecan was eliminated.  She denied any weight loss or appetite changes.  She denied any decline in her performance status or activity level.  Quality of life remained reasonable at this time.   She denied headaches, blurry vision, syncope or seizures.  Denies any fevers, chills or sweats.  Denied chest pain, palpitation, orthopnea or leg edema.  Denied cough, wheezing or hemoptysis.  Denied  nausea, vomiting or abdominal pain.  Denies any constipation or diarrhea.  Denies any frequency urgency or hesitancy.  Denies any arthralgias or myalgias.  Denies any skin rashes or lesions.  Denies any bleeding or clotting tendency.  Denies any easy bruising.  Denies any hair or nail changes.  Denies any anxiety or depression.  Remaining review of system is negative.        Medications: Without any changes on review. Current Outpatient Prescriptions  Medication Sig Dispense Refill  . acetaminophen (TYLENOL) 500 MG tablet Take 500 mg by mouth every 6 (six) hours as needed for pain.      . diphenhydrAMINE (BENADRYL) 25 mg capsule Take 25-50 mg by mouth daily as needed for itching. For allergic reaction      . lidocaine-prilocaine (EMLA) cream Apply 1 application topically as needed. Apply approx 1/2 tsp to skin over port, prior to chemotherapy treatments  1 kit  3  . Multiple Vitamin (MULTIVITAMIN) tablet Take 1 tablet by mouth daily.      . ondansetron (ZOFRAN) 8 MG tablet Take 1 tablet (8 mg total) by mouth every 8 (eight) hours as needed for nausea.  30 tablet  1   No current facility-administered medications for this visit.     Allergies: No Known Allergies  Past Medical History, Surgical history, Social history, and Family History unchanged on review.    Physical Exam:  Blood pressure (!) 155/84, pulse 80, temperature 98 F (36.7 C), temperature source Oral, resp. rate 18, height _0  (1.626 m), weight 148 lb 11.2 oz (67.4 kg), SpO2 100 %.  ECOG: 0    General appearance: Comfortable appearing without any discomfort Head: Normocephalic without any trauma Oropharynx: Mucous membranes are moist and pink without any thrush or ulcers. Eyes: Pupils are equal and round reactive to light. Lymph nodes: No cervical, supraclavicular, inguinal or axillary lymphadenopathy.   Heart:regular rate and rhythm.  S1 and S2 without leg edema. Lung: Clear without any rhonchi or  wheezes.  No dullness to percussion. Abdomin: Soft, nontender, nondistended with good bowel sounds.  No hepatosplenomegaly. Musculoskeletal: No joint deformity or effusion.  Full range of motion noted. Neurological: No deficits noted on motor, sensory and deep tendon reflex exam. Skin: No petechial rash or dryness.  Appeared moist.              CBC    Component Value Date/Time   WBC 4.7 04/13/2019 0753   WBC 4.2 09/08/2017 0834   RBC 3.73 (L) 04/13/2019 0753   HGB 11.9 (L) 04/13/2019 0753   HGB 12.3 05/26/2017 0809   HCT 36.4 04/13/2019 0753   HCT 37.6 05/26/2017 0809   PLT 220 04/13/2019 0753   PLT 180 05/26/2017 0809   MCV 97.6 04/13/2019 0753   MCV 100.3 05/26/2017 0809   MCH 31.9 04/13/2019 0753   MCHC 32.7 04/13/2019 0753   RDW 15.6 (H) 04/13/2019 0753   RDW 17.4 (H) 05/26/2017 0809   LYMPHSABS 1.0 04/13/2019 0753   LYMPHSABS 1.2 05/26/2017 0809   MONOABS 0.7 04/13/2019 0753   MONOABS 0.5 05/26/2017 0809   EOSABS 0.1 04/13/2019 0753   EOSABS 0.1 05/26/2017 0809   BASOSABS 0.1 04/13/2019 0753   BASOSABS 0.0 05/26/2017 0809    Results for Margaret Elliott, Margaret Elliott (MRN [id number]) as of 04/13/2019 08:25  Ref. Range 03/15/2019 07:56 03/29/2019 09:04  CEA ([facility]) Latest Ref Range: 0.00 - 5.00 ng/mL 8.34 (H) 5.50 (H)       Impression and Plan:  68 year old woman with:   1.  Colon cancer diagnosed in 2014.  She presented with stage IV disease and hepatic involvement.  She had developed relapsed disease in 2017 with peritoneal disease.  She is currently on maintenance of 5-FU, leucovorin and a Avastin with excellent tolerance.  The natural course of this disease was reviewed as well as different salvage therapy options.  Potential complications associated with this therapy or any additional salvage therapy were reviewed.  And the plan is to continue with the same maintenance approach and repeat imaging studies in December 2020.  Her CEA appears to be declining  could indicate positive response to therapy for the time being.    2.  IV access: Port-A-Cath remains in use without any issues.   3. 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  No exacerbation noted.  4.  Abdominal pain: Related to peritoneal involvement and appears to be better at this time.  5.  Anxiety: Manageable with Ativan at this time.  No recent exacerbation.  6.  Prognosis: Her disease is incurable but has been palliated for an extended period of time.  Aggressive measures remains warranted at this time.  7. Followup: She will return in 2 weeks for the next cycle of therapy.    25 minutes spent today face-to-face with the patient.  More than 50% of today's visit was spent on updating her disease status, reviewing laboratory data and answering questions regarding future plan of care.  Zola Button MD 04/13/19

## 2019-04-13 NOTE — Patient Instructions (Signed)
[facility] Discharge Instructions for Patients Receiving Chemotherapy  Today you received the following chemotherapy agents: avastin, leucovorin, 5FU   To help prevent nausea and vomiting after your treatment, we encourage you to take your nausea medication as directed.    If you develop nausea and vomiting that is not controlled by your nausea medication, call the clinic.   BELOW ARE SYMPTOMS THAT SHOULD BE REPORTED IMMEDIATELY:  *FEVER GREATER THAN 100.5 F  *CHILLS WITH OR WITHOUT FEVER  NAUSEA AND VOMITING THAT IS NOT CONTROLLED WITH YOUR NAUSEA MEDICATION  *UNUSUAL SHORTNESS OF BREATH  *UNUSUAL BRUISING OR BLEEDING  TENDERNESS IN MOUTH AND THROAT WITH OR WITHOUT PRESENCE OF ULCERS  *URINARY PROBLEMS  *BOWEL PROBLEMS  UNUSUAL RASH Items with * indicate a potential emergency and should be followed up as soon as possible.  Feel free to call the clinic should you have any questions or concerns. The clinic phone number is [phone].  Please show the [facility] at check-in to the Emergency Department and triage nurse.

## 2019-04-15 ENCOUNTER — Inpatient Hospital Stay: Payer: Medicare Other

## 2019-04-15 ENCOUNTER — Other Ambulatory Visit: Payer: Self-pay

## 2019-04-15 VITALS — BP 143/88 | HR 68 | Temp 98.0°F | Resp 18

## 2019-04-15 DIAGNOSIS — K769 Liver disease, unspecified: Secondary | ICD-10-CM

## 2019-04-15 DIAGNOSIS — T451X5A Adverse effect of antineoplastic and immunosuppressive drugs, initial encounter: Secondary | ICD-10-CM | POA: Diagnosis not present

## 2019-04-15 DIAGNOSIS — C189 Malignant neoplasm of colon, unspecified: Secondary | ICD-10-CM

## 2019-04-15 DIAGNOSIS — Z5112 Encounter for antineoplastic immunotherapy: Secondary | ICD-10-CM | POA: Diagnosis not present

## 2019-04-15 DIAGNOSIS — C786 Secondary malignant neoplasm of retroperitoneum and peritoneum: Secondary | ICD-10-CM | POA: Diagnosis not present

## 2019-04-15 DIAGNOSIS — K6389 Other specified diseases of intestine: Secondary | ICD-10-CM

## 2019-04-15 DIAGNOSIS — Z5111 Encounter for antineoplastic chemotherapy: Secondary | ICD-10-CM | POA: Diagnosis not present

## 2019-04-15 DIAGNOSIS — G620 Drug-induced polyneuropathy: Secondary | ICD-10-CM | POA: Diagnosis not present

## 2019-04-15 MED ORDER — SODIUM CHLORIDE 0.9% FLUSH
10.0000 mL | INTRAVENOUS | Status: DC | PRN
  Administered 2019-04-15: 20 mL
  Filled 2019-04-15: qty 10

## 2019-04-15 MED ORDER — HEPARIN SOD (PORK) LOCK FLUSH 100 UNIT/ML IV SOLN
500.0000 [IU] | Freq: Once | INTRAVENOUS | Status: AC | PRN
  Administered 2019-04-15: 500 [IU]
  Filled 2019-04-15: qty 5

## 2019-04-26 ENCOUNTER — Inpatient Hospital Stay: Payer: Medicare Other

## 2019-04-26 ENCOUNTER — Other Ambulatory Visit: Payer: Self-pay

## 2019-04-26 ENCOUNTER — Inpatient Hospital Stay: Payer: Medicare Other | Attending: Oncology | Admitting: Oncology

## 2019-04-26 VITALS — BP 142/99 | HR 88 | Temp 98.2°F | Resp 18 | Ht 64.0 in | Wt 147.7 lb

## 2019-04-26 VITALS — BP 136/80

## 2019-04-26 DIAGNOSIS — Z5111 Encounter for antineoplastic chemotherapy: Secondary | ICD-10-CM | POA: Insufficient documentation

## 2019-04-26 DIAGNOSIS — C189 Malignant neoplasm of colon, unspecified: Secondary | ICD-10-CM

## 2019-04-26 DIAGNOSIS — R109 Unspecified abdominal pain: Secondary | ICD-10-CM | POA: Diagnosis not present

## 2019-04-26 DIAGNOSIS — Z79899 Other long term (current) drug therapy: Secondary | ICD-10-CM | POA: Diagnosis not present

## 2019-04-26 DIAGNOSIS — Z5112 Encounter for antineoplastic immunotherapy: Secondary | ICD-10-CM | POA: Diagnosis not present

## 2019-04-26 DIAGNOSIS — C787 Secondary malignant neoplasm of liver and intrahepatic bile duct: Secondary | ICD-10-CM | POA: Diagnosis not present

## 2019-04-26 DIAGNOSIS — Z95828 Presence of other vascular implants and grafts: Secondary | ICD-10-CM

## 2019-04-26 DIAGNOSIS — G620 Drug-induced polyneuropathy: Secondary | ICD-10-CM | POA: Insufficient documentation

## 2019-04-26 DIAGNOSIS — C786 Secondary malignant neoplasm of retroperitoneum and peritoneum: Secondary | ICD-10-CM | POA: Insufficient documentation

## 2019-04-26 DIAGNOSIS — F419 Anxiety disorder, unspecified: Secondary | ICD-10-CM | POA: Diagnosis not present

## 2019-04-26 DIAGNOSIS — K769 Liver disease, unspecified: Secondary | ICD-10-CM

## 2019-04-26 DIAGNOSIS — K6389 Other specified diseases of intestine: Secondary | ICD-10-CM

## 2019-04-26 DIAGNOSIS — T451X5A Adverse effect of antineoplastic and immunosuppressive drugs, initial encounter: Secondary | ICD-10-CM | POA: Diagnosis not present

## 2019-04-26 LAB — CBC WITH DIFFERENTIAL (CANCER CENTER ONLY)
Abs Immature Granulocytes: 0.01 10*3/uL (ref 0.00–0.07)
Basophils Absolute: 0 10*3/uL (ref 0.0–0.1)
Basophils Relative: 1 %
Eosinophils Absolute: 0.1 10*3/uL (ref 0.0–0.5)
Eosinophils Relative: 3 %
HCT: 37.7 % (ref 36.0–46.0)
Hemoglobin: 12.4 g/dL (ref 12.0–15.0)
Immature Granulocytes: 0 %
Lymphocytes Relative: 31 %
Lymphs Abs: 1.3 10*3/uL (ref 0.7–4.0)
MCH: 32.5 pg (ref 26.0–34.0)
MCHC: 32.9 g/dL (ref 30.0–36.0)
MCV: 98.7 fL (ref 80.0–100.0)
Monocytes Absolute: 0.6 10*3/uL (ref 0.1–1.0)
Monocytes Relative: 15 %
Neutro Abs: 2.1 10*3/uL (ref 1.7–7.7)
Neutrophils Relative %: 50 %
Platelet Count: 289 10*3/uL (ref 150–400)
RBC: 3.82 MIL/uL — ABNORMAL LOW (ref 3.87–5.11)
RDW: 15.8 % — ABNORMAL HIGH (ref 11.5–15.5)
WBC Count: 4.2 10*3/uL (ref 4.0–10.5)
nRBC: 0 % (ref 0.0–0.2)

## 2019-04-26 LAB — CMP (CANCER CENTER ONLY)
ALT: 9 U/L (ref 0–44)
AST: 16 U/L (ref 15–41)
Albumin: 3.6 g/dL (ref 3.5–5.0)
Alkaline Phosphatase: 79 U/L (ref 38–126)
Anion gap: 10 (ref 5–15)
BUN: 18 mg/dL (ref 8–23)
CO2: 23 mmol/L (ref 22–32)
Calcium: 9 mg/dL (ref 8.9–10.3)
Chloride: 108 mmol/L (ref 98–111)
Creatinine: 1.05 mg/dL — ABNORMAL HIGH (ref 0.44–1.00)
GFR, Est AFR Am: >60 mL/min (ref >60)
GFR, Estimated: 54 mL/min — ABNORMAL LOW (ref >60)
Glucose, Bld: 125 mg/dL — ABNORMAL HIGH (ref 70–99)
Potassium: 3.9 mmol/L (ref 3.5–5.1)
Sodium: 141 mmol/L (ref 135–145)
Total Bilirubin: 0.6 mg/dL (ref 0.3–1.2)
Total Protein: 7.4 g/dL (ref 6.5–8.1)

## 2019-04-26 LAB — CEA (IN HOUSE-CHCC): CEA (CHCC-In House): 3.49 ng/mL (ref 0.00–5.00)

## 2019-04-26 MED ORDER — SODIUM CHLORIDE 0.9 % IV SOLN
5.0000 mg/kg | Freq: Once | INTRAVENOUS | Status: AC
  Administered 2019-04-26: 11:00:00 350 mg via INTRAVENOUS
  Filled 2019-04-26: qty 14

## 2019-04-26 MED ORDER — FLUOROURACIL CHEMO INJECTION 2.5 GM/50ML
400.0000 mg/m2 | Freq: Once | INTRAVENOUS | Status: AC
  Administered 2019-04-26: 13:00:00 700 mg via INTRAVENOUS
  Filled 2019-04-26: qty 14

## 2019-04-26 MED ORDER — PROCHLORPERAZINE MALEATE 10 MG PO TABS
ORAL_TABLET | ORAL | Status: AC
  Filled 2019-04-26: qty 1

## 2019-04-26 MED ORDER — LEUCOVORIN CALCIUM INJECTION 350 MG
400.0000 mg/m2 | Freq: Once | INTRAVENOUS | Status: AC
  Administered 2019-04-26: 11:00:00 712 mg via INTRAVENOUS
  Filled 2019-04-26: qty 35.6

## 2019-04-26 MED ORDER — PROCHLORPERAZINE MALEATE 10 MG PO TABS
10.0000 mg | ORAL_TABLET | Freq: Four times a day (QID) | ORAL | Status: DC | PRN
  Administered 2019-04-26: 10 mg via ORAL

## 2019-04-26 MED ORDER — SODIUM CHLORIDE 0.9% FLUSH
10.0000 mL | INTRAVENOUS | Status: DC | PRN
  Filled 2019-04-26: qty 10

## 2019-04-26 MED ORDER — SODIUM CHLORIDE 0.9% FLUSH
10.0000 mL | Freq: Once | INTRAVENOUS | Status: AC
  Administered 2019-04-26: 09:00:00 10 mL
  Filled 2019-04-26: qty 10

## 2019-04-26 MED ORDER — SODIUM CHLORIDE 0.9 % IV SOLN
2400.0000 mg/m2 | INTRAVENOUS | Status: DC
  Administered 2019-04-26: 13:00:00 4250 mg via INTRAVENOUS
  Filled 2019-04-26: qty 85

## 2019-04-26 MED ORDER — SODIUM CHLORIDE 0.9 % IV SOLN
Freq: Once | INTRAVENOUS | Status: AC
  Administered 2019-04-26: 10:00:00 via INTRAVENOUS
  Filled 2019-04-26: qty 250

## 2019-04-26 NOTE — Progress Notes (Signed)
Per Dr. Alen Blew it is ok to give Avastin today without collecting a urine protein.

## 2019-04-26 NOTE — Progress Notes (Signed)
Hematology and Oncology Follow Up Visit  Margaret Elliott [id number] 19-Nov-1950    04/26/19   Principle Diagnosis: 68 year old woman with colon cancer diagnosed in 2014.  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   FOLFOX and a Avastin chemotherapy started 01/18/2013. She is S/P 12 cycles completed in 05/2013.   She is a status post microwave ablation of metastatic lesion in the posterior segment of the right lobe of the liver completed on 09/28/2014 and repeated on 11/30/2014.  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  FOLFIRI and a Avastin salvage therapy started on 12/10/2015.  Therapy discontinued and maintained on 5-FU leucovorin with a Avastin only till March 2020.  FOLFIRI and a Avastin restarted on 10/05/2018.   She is status post 12 cycles of therapy.    Current therapy:     She is currently receiving maintenance 5-FU, leucovorin and a Avastin maintenance restarted on October 7 of 2020.  The next cycle scheduled for today.   Interim History:  Margaret Elliott returns today for a repeat follow-up.  Since the last visit, she reports feeling well without any major complaints.  She denies any nausea vomiting or abdominal pain.  She denies any worsening fatigue or tiredness.  She denies any worsening neuropathy at this time.  She continues to be active and enjoy reasonable quality of life.   Patient denied any alteration mental status, neuropathy, confusion or dizziness.  Denies any headaches or lethargy.  Denies any night sweats, weight loss or changes in appetite.  Denied orthopnea, dyspnea on exertion or chest discomfort.  Denies shortness of breath, difficulty breathing hemoptysis or cough.  Denies any abdominal  distention, nausea, early satiety or dyspepsia.  Denies any hematuria, frequency, dysuria or nocturia.  Denies any skin irritation, dryness or rash.  Denies any ecchymosis or petechiae.  Denies any lymphadenopathy or clotting.  Denies any heat or cold intolerance.  Denies any anxiety or depression.  Remaining review of system is negative.          Medications: Updated without any changes. Current Outpatient Prescriptions  Medication Sig Dispense Refill  . acetaminophen (TYLENOL) 500 MG tablet Take 500 mg by mouth every 6 (six) hours as needed for pain.      . diphenhydrAMINE (BENADRYL) 25 mg capsule Take 25-50 mg by mouth daily as needed for itching. For allergic reaction      . lidocaine-prilocaine (EMLA) cream Apply 1 application topically as needed. Apply approx 1/2 tsp to skin over port, prior to chemotherapy treatments  1 kit  3  . Multiple Vitamin (MULTIVITAMIN) tablet Take 1 tablet by mouth daily.      . ondansetron (ZOFRAN) 8 MG tablet Take 1 tablet (8 mg total) by mouth every 8 (eight) hours as needed for nausea.  30 tablet  1   No current facility-administered medications for this visit.     Allergies: No Known Allergies  Past Medical History, Surgical history, Social history, and Family History remains without any change.    Physical Exam:   Blood pressure (!) 142/99, pulse 88, temperature 98.2 F (36.8 C), temperature source Temporal, resp. rate 18, height 5' 4" (1.626 m), weight 147 lb 11.2 oz (67 kg), SpO2 100 %.     ECOG:  0    General appearance: Alert, awake without any distress. Head: Atraumatic without abnormalities Oropharynx: Without any thrush or ulcers. Eyes: No scleral icterus. Lymph nodes: No lymphadenopathy noted in the cervical, supraclavicular, or axillary nodes Heart:regular rate and rhythm, without any murmurs or gallops.   Lung: Clear to auscultation without any rhonchi, wheezes or dullness to percussion. Abdomin: Soft, nontender without  any shifting dullness or ascites. Musculoskeletal: No clubbing or cyanosis. Neurological: No motor or sensory deficits. Skin: No rashes or lesions.             CBC    Component Value Date/Time   WBC 4.7 04/13/2019 0753   WBC 4.2 09/08/2017 0834   RBC 3.73 (L) 04/13/2019 0753   HGB 11.9 (L) 04/13/2019 0753   HGB 12.3 05/26/2017 0809   HCT 36.4 04/13/2019 0753   HCT 37.6 05/26/2017 0809   PLT 220 04/13/2019 0753   PLT 180 05/26/2017 0809   MCV 97.6 04/13/2019 0753   MCV 100.3 05/26/2017 0809   MCH 31.9 04/13/2019 0753   MCHC 32.7 04/13/2019 0753   RDW 15.6 (H) 04/13/2019 0753   RDW 17.4 (H) 05/26/2017 0809   LYMPHSABS 1.0 04/13/2019 0753   LYMPHSABS 1.2 05/26/2017 0809   MONOABS 0.7 04/13/2019 0753   MONOABS 0.5 05/26/2017 0809   EOSABS 0.1 04/13/2019 0753   EOSABS 0.1 05/26/2017 0809   BASOSABS 0.1 04/13/2019 0753   BASOSABS 0.0 05/26/2017 0809      Results for Margaret, Elliott (MRN [id number]) as of 04/26/2019 09:18  Ref. Range 01/04/2019 08:21 01/18/2019 08:26 02/01/2019 08:49 02/15/2019 08:35 03/02/2019 09:29 03/15/2019 07:56 03/29/2019 09:04 04/13/2019 07:53  CEA ([facility]) Latest Ref Range: 0.00 - 5.00 ng/mL 23.99 (H) 15.93 (H) 13.46 (H) 12.08 (H) 11.23 (H) 8.34 (H) 5.50 (H) 6.42 (H)      Impression and Plan:  68 year old woman with:   1.  Stage IV colon cancer with peritoneal involvement recurred in 2017 after initial diagnosis in 2014.  She has tolerated maintenance of 5-FU, leucovorin and Avastin without any recent complications.  Risks and benefits of continuing this approach was reviewed today she is agreeable to continue with repeat imaging studies scheduled for December 2020.  Alternative options would include different salvage therapy if she developed symptomatic progression of her disease.    2.  IV access: Port-A-Cath currently in use without any issues.   3. Neuropathy: Unchanged with a recent chemotherapy regimen.  4.  Abdominal pain:  Manageable with very little intervention.  This is related to her peritoneal involvement.  5.  Anxiety: Mood relatively stable without any recent exacerbation.  6.  Prognosis: Aggressive measures are warranted given her excellent performance status although her disease is incurable.  7. Followup: In 2 weeks for repeat follow-up.  25 minutes spent today face-to-face with the patient.  More than 50% of today's visit was spent on updating the natural course of her disease, treatment options and answering questions regarding future plan of care.  Zola Button MD 04/26/19

## 2019-04-26 NOTE — Patient Instructions (Signed)
[facility] Discharge Instructions for Patients Receiving Chemotherapy  Today you received the following chemotherapy agents Bevacizumab, Leucovorin, Fluorouracil.  To help prevent nausea and vomiting after your treatment, we encourage you to take your nausea medication as directed.  If you develop nausea and vomiting that is not controlled by your nausea medication, call the clinic.   BELOW ARE SYMPTOMS THAT SHOULD BE REPORTED IMMEDIATELY:  *FEVER GREATER THAN 100.5 F  *CHILLS WITH OR WITHOUT FEVER  NAUSEA AND VOMITING THAT IS NOT CONTROLLED WITH YOUR NAUSEA MEDICATION  *UNUSUAL SHORTNESS OF BREATH  *UNUSUAL BRUISING OR BLEEDING  TENDERNESS IN MOUTH AND THROAT WITH OR WITHOUT PRESENCE OF ULCERS  *URINARY PROBLEMS  *BOWEL PROBLEMS  UNUSUAL RASH Items with * indicate a potential emergency and should be followed up as soon as possible.  Feel free to call the clinic should you have any questions or concerns. The clinic phone number is [phone].  Please show the [facility] at check-in to the Emergency Department and triage nurse.

## 2019-04-26 NOTE — Patient Instructions (Signed)

## 2019-04-26 NOTE — Addendum Note (Signed)
Addended by: Wyatt Portela on: 04/26/2019 09:52 AM   Modules accepted: Orders

## 2019-04-27 ENCOUNTER — Telehealth: Payer: Self-pay | Admitting: Oncology

## 2019-04-27 NOTE — Telephone Encounter (Signed)
Scheduled appt per 11/4 los.  Pt will get a print out at her next scheduled appt.

## 2019-04-28 ENCOUNTER — Other Ambulatory Visit: Payer: Self-pay

## 2019-04-28 ENCOUNTER — Inpatient Hospital Stay: Payer: Medicare Other

## 2019-04-28 VITALS — BP 150/90 | HR 72 | Temp 98.5°F | Resp 18

## 2019-04-28 DIAGNOSIS — C189 Malignant neoplasm of colon, unspecified: Secondary | ICD-10-CM | POA: Diagnosis not present

## 2019-04-28 DIAGNOSIS — Z5112 Encounter for antineoplastic immunotherapy: Secondary | ICD-10-CM | POA: Diagnosis not present

## 2019-04-28 DIAGNOSIS — K6389 Other specified diseases of intestine: Secondary | ICD-10-CM

## 2019-04-28 DIAGNOSIS — Z5111 Encounter for antineoplastic chemotherapy: Secondary | ICD-10-CM | POA: Diagnosis not present

## 2019-04-28 DIAGNOSIS — C787 Secondary malignant neoplasm of liver and intrahepatic bile duct: Secondary | ICD-10-CM | POA: Diagnosis not present

## 2019-04-28 DIAGNOSIS — K769 Liver disease, unspecified: Secondary | ICD-10-CM

## 2019-04-28 DIAGNOSIS — C786 Secondary malignant neoplasm of retroperitoneum and peritoneum: Secondary | ICD-10-CM | POA: Diagnosis not present

## 2019-04-28 DIAGNOSIS — G620 Drug-induced polyneuropathy: Secondary | ICD-10-CM | POA: Diagnosis not present

## 2019-04-28 MED ORDER — SODIUM CHLORIDE 0.9% FLUSH
10.0000 mL | INTRAVENOUS | Status: DC | PRN
  Administered 2019-04-28: 12:00:00 10 mL
  Filled 2019-04-28: qty 10

## 2019-04-28 MED ORDER — HEPARIN SOD (PORK) LOCK FLUSH 100 UNIT/ML IV SOLN
500.0000 [IU] | Freq: Once | INTRAVENOUS | Status: AC | PRN
  Administered 2019-04-28: 500 [IU]
  Filled 2019-04-28: qty 5

## 2019-05-10 ENCOUNTER — Other Ambulatory Visit: Payer: Self-pay

## 2019-05-10 ENCOUNTER — Inpatient Hospital Stay: Payer: Medicare Other

## 2019-05-10 ENCOUNTER — Inpatient Hospital Stay (HOSPITAL_BASED_OUTPATIENT_CLINIC_OR_DEPARTMENT_OTHER): Payer: Medicare Other | Admitting: Oncology

## 2019-05-10 VITALS — BP 148/94

## 2019-05-10 VITALS — BP 143/99 | HR 91 | Temp 98.0°F | Resp 18 | Ht 64.0 in | Wt 147.2 lb

## 2019-05-10 DIAGNOSIS — K6389 Other specified diseases of intestine: Secondary | ICD-10-CM

## 2019-05-10 DIAGNOSIS — Z95828 Presence of other vascular implants and grafts: Secondary | ICD-10-CM

## 2019-05-10 DIAGNOSIS — C786 Secondary malignant neoplasm of retroperitoneum and peritoneum: Secondary | ICD-10-CM | POA: Diagnosis not present

## 2019-05-10 DIAGNOSIS — C787 Secondary malignant neoplasm of liver and intrahepatic bile duct: Secondary | ICD-10-CM | POA: Diagnosis not present

## 2019-05-10 DIAGNOSIS — Z5112 Encounter for antineoplastic immunotherapy: Secondary | ICD-10-CM | POA: Diagnosis not present

## 2019-05-10 DIAGNOSIS — C189 Malignant neoplasm of colon, unspecified: Secondary | ICD-10-CM

## 2019-05-10 DIAGNOSIS — G620 Drug-induced polyneuropathy: Secondary | ICD-10-CM | POA: Diagnosis not present

## 2019-05-10 DIAGNOSIS — K769 Liver disease, unspecified: Secondary | ICD-10-CM

## 2019-05-10 DIAGNOSIS — Z5111 Encounter for antineoplastic chemotherapy: Secondary | ICD-10-CM | POA: Diagnosis not present

## 2019-05-10 LAB — CBC WITH DIFFERENTIAL (CANCER CENTER ONLY)
Abs Immature Granulocytes: 0 10*3/uL (ref 0.00–0.07)
Basophils Absolute: 0 10*3/uL (ref 0.0–0.1)
Basophils Relative: 1 %
Eosinophils Absolute: 0.1 10*3/uL (ref 0.0–0.5)
Eosinophils Relative: 3 %
HCT: 38.3 % (ref 36.0–46.0)
Hemoglobin: 12.3 g/dL (ref 12.0–15.0)
Immature Granulocytes: 0 %
Lymphocytes Relative: 35 %
Lymphs Abs: 1.2 10*3/uL (ref 0.7–4.0)
MCH: 31.6 pg (ref 26.0–34.0)
MCHC: 32.1 g/dL (ref 30.0–36.0)
MCV: 98.5 fL (ref 80.0–100.0)
Monocytes Absolute: 0.6 10*3/uL (ref 0.1–1.0)
Monocytes Relative: 16 %
Neutro Abs: 1.6 10*3/uL — ABNORMAL LOW (ref 1.7–7.7)
Neutrophils Relative %: 45 %
Platelet Count: 197 10*3/uL (ref 150–400)
RBC: 3.89 MIL/uL (ref 3.87–5.11)
RDW: 15.7 % — ABNORMAL HIGH (ref 11.5–15.5)
WBC Count: 3.5 10*3/uL — ABNORMAL LOW (ref 4.0–10.5)
nRBC: 0 % (ref 0.0–0.2)

## 2019-05-10 LAB — CMP (CANCER CENTER ONLY)
ALT: 9 U/L (ref 0–44)
AST: 16 U/L (ref 15–41)
Albumin: 3.7 g/dL (ref 3.5–5.0)
Alkaline Phosphatase: 65 U/L (ref 38–126)
Anion gap: 11 (ref 5–15)
BUN: 17 mg/dL (ref 8–23)
CO2: 24 mmol/L (ref 22–32)
Calcium: 9.1 mg/dL (ref 8.9–10.3)
Chloride: 107 mmol/L (ref 98–111)
Creatinine: 1.07 mg/dL — ABNORMAL HIGH (ref 0.44–1.00)
GFR, Est AFR Am: >60 mL/min (ref >60)
GFR, Estimated: 53 mL/min — ABNORMAL LOW (ref >60)
Glucose, Bld: 96 mg/dL (ref 70–99)
Potassium: 3.8 mmol/L (ref 3.5–5.1)
Sodium: 142 mmol/L (ref 135–145)
Total Bilirubin: 1 mg/dL (ref 0.3–1.2)
Total Protein: 7.3 g/dL (ref 6.5–8.1)

## 2019-05-10 LAB — CEA (IN HOUSE-CHCC): CEA (CHCC-In House): 4.2 ng/mL (ref 0.00–5.00)

## 2019-05-10 LAB — TOTAL PROTEIN, URINE DIPSTICK: Protein, ur: 30 mg/dL — AB

## 2019-05-10 MED ORDER — SODIUM CHLORIDE 0.9 % IV SOLN
2400.0000 mg/m2 | INTRAVENOUS | Status: DC
  Administered 2019-05-10: 4250 mg via INTRAVENOUS
  Filled 2019-05-10: qty 85

## 2019-05-10 MED ORDER — LEUCOVORIN CALCIUM INJECTION 350 MG
400.0000 mg/m2 | Freq: Once | INTRAVENOUS | Status: AC
  Administered 2019-05-10: 712 mg via INTRAVENOUS
  Filled 2019-05-10: qty 35.6

## 2019-05-10 MED ORDER — FLUOROURACIL CHEMO INJECTION 2.5 GM/50ML
400.0000 mg/m2 | Freq: Once | INTRAVENOUS | Status: AC
  Administered 2019-05-10: 700 mg via INTRAVENOUS
  Filled 2019-05-10: qty 14

## 2019-05-10 MED ORDER — PROCHLORPERAZINE MALEATE 10 MG PO TABS
10.0000 mg | ORAL_TABLET | Freq: Four times a day (QID) | ORAL | Status: DC | PRN
  Administered 2019-05-10: 10 mg via ORAL

## 2019-05-10 MED ORDER — SODIUM CHLORIDE 0.9 % IV SOLN
Freq: Once | INTRAVENOUS | Status: AC
  Administered 2019-05-10: 10:00:00 via INTRAVENOUS
  Filled 2019-05-10: qty 250

## 2019-05-10 MED ORDER — SODIUM CHLORIDE 0.9% FLUSH
10.0000 mL | Freq: Once | INTRAVENOUS | Status: AC
  Administered 2019-05-10: 09:00:00 10 mL
  Filled 2019-05-10: qty 10

## 2019-05-10 MED ORDER — PROCHLORPERAZINE MALEATE 10 MG PO TABS
ORAL_TABLET | ORAL | Status: AC
  Filled 2019-05-10: qty 1

## 2019-05-10 MED ORDER — SODIUM CHLORIDE 0.9 % IV SOLN
5.0000 mg/kg | Freq: Once | INTRAVENOUS | Status: AC
  Administered 2019-05-10: 350 mg via INTRAVENOUS
  Filled 2019-05-10: qty 14

## 2019-05-10 NOTE — Patient Instructions (Signed)
[facility] Discharge Instructions for Patients Receiving Chemotherapy  Today you received the following chemotherapy agents: bevacizumab/leucovorin/fluorouracil.  To help prevent nausea and vomiting after your treatment, we encourage you to take your nausea medication as directed.   If you develop nausea and vomiting that is not controlled by your nausea medication, call the clinic.   BELOW ARE SYMPTOMS THAT SHOULD BE REPORTED IMMEDIATELY:  *FEVER GREATER THAN 100.5 F  *CHILLS WITH OR WITHOUT FEVER  NAUSEA AND VOMITING THAT IS NOT CONTROLLED WITH YOUR NAUSEA MEDICATION  *UNUSUAL SHORTNESS OF BREATH  *UNUSUAL BRUISING OR BLEEDING  TENDERNESS IN MOUTH AND THROAT WITH OR WITHOUT PRESENCE OF ULCERS  *URINARY PROBLEMS  *BOWEL PROBLEMS  UNUSUAL RASH Items with * indicate a potential emergency and should be followed up as soon as possible.  Feel free to call the clinic should you have any questions or concerns. The clinic phone number is [phone].  Please show the CHEMO ALERT CARD at check-in to the Emergency Department and triage nurse.   

## 2019-05-10 NOTE — Progress Notes (Signed)
Hematology and Oncology Follow Up Visit  Margaret Elliott [id number] 09/11/1950    05/10/19   Principle Diagnosis: 68 year old woman with stage IV colon cancer with hepatic and peritoneal metastasis diagnosed in 2014.  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   Prior Therapy:  She is status post laparoscopic laparotomy right hemicolectomy with ileocolonic anastomosis done on December 09, 2012.   FOLFOX and a Avastin chemotherapy started 01/18/2013. She is S/P 12 cycles completed in 05/2013.   She is a status post microwave ablation of metastatic lesion in the posterior segment of the right lobe of the liver completed on 09/28/2014 and repeated on 11/30/2014.  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  FOLFIRI and a Avastin salvage therapy started on 12/10/2015.  Therapy discontinued and maintained on 5-FU leucovorin with a Avastin only till March 2020.  FOLFIRI and a Avastin restarted on 10/05/2018.   She is status post 12 cycles of therapy.    Current therapy:    Maintenance 5-FU, leucovorin and a Avastin maintenance restarted on October 7 of 2020.  She is here for the next cycle of therapy.   Interim History:  Margaret Elliott returns today for a repeat evaluation.  Since the last visit, she reports no major changes in her health.  She continues to tolerate chemotherapy without any complaints.  She denies any nausea, vomiting or abdominal pain.  She denies any worsening neuropathy.  She denies any excessive fatigue or tiredness.  She continues to enjoy reasonable quality of life.   She denied headaches, blurry vision, syncope or seizures.  Denies any fevers, chills or sweats.  Denied chest pain, palpitation, orthopnea or leg edema.  Denied cough, wheezing or hemoptysis.  Denied nausea, vomiting or abdominal pain.  Denies any constipation or diarrhea.  Denies any frequency urgency or hesitancy.  Denies any  arthralgias or myalgias.  Denies any skin rashes or lesions.  Denies any bleeding or clotting tendency.  Denies any easy bruising.  Denies any hair or nail changes.  Denies any anxiety or depression.  Remaining review of system is negative.             Medications: Unchanged on review. Current Outpatient Prescriptions  Medication Sig Dispense Refill  . acetaminophen (TYLENOL) 500 MG tablet Take 500 mg by mouth every 6 (six) hours as needed for pain.      . diphenhydrAMINE (BENADRYL) 25 mg capsule Take 25-50 mg by mouth daily as needed for itching. For allergic reaction      . lidocaine-prilocaine (EMLA) cream Apply 1 application topically as needed. Apply approx 1/2 tsp to skin over port, prior to chemotherapy treatments  1 kit  3  . Multiple Vitamin (MULTIVITAMIN) tablet Take 1 tablet by mouth daily.      . ondansetron (ZOFRAN) 8 MG tablet Take 1 tablet (8 mg total) by mouth every 8 (eight) hours as needed for nausea.  30 tablet  1   No current facility-administered medications for this visit.     Allergies: No Known Allergies  Past Medical History, Surgical history, Social history, and Family History remains without any change.    Physical Exam:  Blood pressure (!) 143/99, pulse 91, temperature 98 F (36.7 C), temperature source Temporal, resp. rate 18, height _0  (1.626 m), weight 147 lb 3.2 oz (66.8 kg), SpO2 99 %.       ECOG: 0    General appearance:  Comfortable appearing without any discomfort Head: Normocephalic without any trauma Oropharynx: Mucous membranes are moist and pink without any thrush or ulcers. Eyes: Pupils are equal and round reactive to light. Lymph nodes: No cervical, supraclavicular, inguinal or axillary lymphadenopathy.   Heart:regular rate and rhythm.  S1 and S2 without leg edema. Lung: Clear without any rhonchi or wheezes.  No dullness to percussion. Abdomin: Soft, nontender, nondistended with good bowel sounds.  No  hepatosplenomegaly. Musculoskeletal: No joint deformity or effusion.  Full range of motion noted. Neurological: No deficits noted on motor, sensory and deep tendon reflex exam. Skin: No petechial rash or dryness.  Appeared moist.               CBC    Component Value Date/Time   WBC 4.2 04/26/2019 0912   WBC 4.2 09/08/2017 0834   RBC 3.82 (L) 04/26/2019 0912   HGB 12.4 04/26/2019 0912   HGB 12.3 05/26/2017 0809   HCT 37.7 04/26/2019 0912   HCT 37.6 05/26/2017 0809   PLT 289 04/26/2019 0912   PLT 180 05/26/2017 0809   MCV 98.7 04/26/2019 0912   MCV 100.3 05/26/2017 0809   MCH 32.5 04/26/2019 0912   MCHC 32.9 04/26/2019 0912   RDW 15.8 (H) 04/26/2019 0912   RDW 17.4 (H) 05/26/2017 0809   LYMPHSABS 1.3 04/26/2019 0912   LYMPHSABS 1.2 05/26/2017 0809   MONOABS 0.6 04/26/2019 0912   MONOABS 0.5 05/26/2017 0809   EOSABS 0.1 04/26/2019 0912   EOSABS 0.1 05/26/2017 0809   BASOSABS 0.0 04/26/2019 0912   BASOSABS 0.0 05/26/2017 0809     Results for Margaret Elliott, Margaret Elliott (MRN [id number]) as of 05/10/2019 09:07  Ref. Range 04/26/2019 09:12  CEA ([facility]) Latest Ref Range: 0.00 - 5.00 ng/mL 3.49        Impression and Plan:  68 year old woman with:   1.  Colon cancer diagnosed in 2014.  She presented with stage IV disease in the liver and subsequently developed peritoneal involvement in 2017.   She is currently on maintenance 5-FU, leucovorin and a Avastin with excellent tolerance.  Her CEA continue to be relatively low without any clinical signs or symptoms of disease progression.  Last CT scan obtained in June 2020 the plan is to repeat imaging studies in December.  Alternative treatment options including resumption of irinotecan based chemotherapy on other choices were reviewed.  She is agreeable to continue at this time with maintenance therapy and will reevaluate after CT scan.    2.  IV access: Port-A-Cath in place and in use without any issues.   3.  Neuropathy: Related to previous chemotherapy exposure and currently stable.  4.  Abdominal pain: Related to previous peritoneal cancer involvement with no recent exacerbation.  5.  Anxiety: No issues reported at this time.  Mood is relatively stable.  6.  Prognosis: His disease is incurable although aggressive measures are warranted given her excellent performance status.  7. Followup: She will return in 2 weeks for the next cycle of therapy.  25 minutes spent today face-to-face with the patient.  More than 50% of today's visit was dedicated to reviewing her disease status, treatment options and complications related to current and future therapies.  Zola Button MD 05/10/19

## 2019-05-11 ENCOUNTER — Telehealth: Payer: Self-pay | Admitting: Oncology

## 2019-05-11 NOTE — Telephone Encounter (Signed)
Scheduled appt per 11/18 los.

## 2019-05-12 ENCOUNTER — Inpatient Hospital Stay: Payer: Medicare Other

## 2019-05-12 ENCOUNTER — Other Ambulatory Visit: Payer: Self-pay

## 2019-05-12 VITALS — BP 137/82 | HR 75 | Temp 98.2°F | Resp 16

## 2019-05-12 DIAGNOSIS — Z5111 Encounter for antineoplastic chemotherapy: Secondary | ICD-10-CM | POA: Diagnosis not present

## 2019-05-12 DIAGNOSIS — C787 Secondary malignant neoplasm of liver and intrahepatic bile duct: Secondary | ICD-10-CM | POA: Diagnosis not present

## 2019-05-12 DIAGNOSIS — K6389 Other specified diseases of intestine: Secondary | ICD-10-CM

## 2019-05-12 DIAGNOSIS — C786 Secondary malignant neoplasm of retroperitoneum and peritoneum: Secondary | ICD-10-CM | POA: Diagnosis not present

## 2019-05-12 DIAGNOSIS — Z5112 Encounter for antineoplastic immunotherapy: Secondary | ICD-10-CM | POA: Diagnosis not present

## 2019-05-12 DIAGNOSIS — C189 Malignant neoplasm of colon, unspecified: Secondary | ICD-10-CM | POA: Diagnosis not present

## 2019-05-12 DIAGNOSIS — G620 Drug-induced polyneuropathy: Secondary | ICD-10-CM | POA: Diagnosis not present

## 2019-05-12 DIAGNOSIS — K769 Liver disease, unspecified: Secondary | ICD-10-CM

## 2019-05-12 MED ORDER — SODIUM CHLORIDE 0.9% FLUSH
10.0000 mL | INTRAVENOUS | Status: DC | PRN
  Administered 2019-05-12: 10 mL
  Filled 2019-05-12: qty 10

## 2019-05-12 MED ORDER — HEPARIN SOD (PORK) LOCK FLUSH 100 UNIT/ML IV SOLN
500.0000 [IU] | Freq: Once | INTRAVENOUS | Status: AC | PRN
  Administered 2019-05-12: 500 [IU]
  Filled 2019-05-12: qty 5

## 2019-05-15 DIAGNOSIS — Z23 Encounter for immunization: Secondary | ICD-10-CM | POA: Diagnosis not present

## 2019-05-24 ENCOUNTER — Inpatient Hospital Stay: Payer: Medicare Other | Attending: Oncology

## 2019-05-24 ENCOUNTER — Inpatient Hospital Stay (HOSPITAL_BASED_OUTPATIENT_CLINIC_OR_DEPARTMENT_OTHER): Payer: Medicare Other | Admitting: Oncology

## 2019-05-24 ENCOUNTER — Inpatient Hospital Stay: Payer: Medicare Other

## 2019-05-24 ENCOUNTER — Other Ambulatory Visit: Payer: Self-pay

## 2019-05-24 VITALS — BP 163/99 | HR 87 | Temp 98.2°F | Resp 18 | Ht 64.0 in | Wt 148.4 lb

## 2019-05-24 VITALS — BP 159/92

## 2019-05-24 DIAGNOSIS — K1239 Other oral mucositis (ulcerative): Secondary | ICD-10-CM | POA: Diagnosis not present

## 2019-05-24 DIAGNOSIS — C787 Secondary malignant neoplasm of liver and intrahepatic bile duct: Secondary | ICD-10-CM

## 2019-05-24 DIAGNOSIS — C189 Malignant neoplasm of colon, unspecified: Secondary | ICD-10-CM | POA: Diagnosis not present

## 2019-05-24 DIAGNOSIS — Z95828 Presence of other vascular implants and grafts: Secondary | ICD-10-CM

## 2019-05-24 DIAGNOSIS — C786 Secondary malignant neoplasm of retroperitoneum and peritoneum: Secondary | ICD-10-CM | POA: Insufficient documentation

## 2019-05-24 DIAGNOSIS — K769 Liver disease, unspecified: Secondary | ICD-10-CM

## 2019-05-24 DIAGNOSIS — Z5112 Encounter for antineoplastic immunotherapy: Secondary | ICD-10-CM | POA: Insufficient documentation

## 2019-05-24 DIAGNOSIS — G620 Drug-induced polyneuropathy: Secondary | ICD-10-CM | POA: Insufficient documentation

## 2019-05-24 DIAGNOSIS — Z5111 Encounter for antineoplastic chemotherapy: Secondary | ICD-10-CM | POA: Insufficient documentation

## 2019-05-24 DIAGNOSIS — F419 Anxiety disorder, unspecified: Secondary | ICD-10-CM | POA: Diagnosis not present

## 2019-05-24 DIAGNOSIS — T451X5A Adverse effect of antineoplastic and immunosuppressive drugs, initial encounter: Secondary | ICD-10-CM | POA: Diagnosis not present

## 2019-05-24 DIAGNOSIS — K6389 Other specified diseases of intestine: Secondary | ICD-10-CM

## 2019-05-24 DIAGNOSIS — Z79899 Other long term (current) drug therapy: Secondary | ICD-10-CM | POA: Diagnosis not present

## 2019-05-24 LAB — CBC WITH DIFFERENTIAL (CANCER CENTER ONLY)
Abs Immature Granulocytes: 0 10*3/uL (ref 0.00–0.07)
Basophils Absolute: 0 10*3/uL (ref 0.0–0.1)
Basophils Relative: 1 %
Eosinophils Absolute: 0.1 10*3/uL (ref 0.0–0.5)
Eosinophils Relative: 3 %
HCT: 37 % (ref 36.0–46.0)
Hemoglobin: 12.1 g/dL (ref 12.0–15.0)
Immature Granulocytes: 0 %
Lymphocytes Relative: 24 %
Lymphs Abs: 1.1 10*3/uL (ref 0.7–4.0)
MCH: 31.8 pg (ref 26.0–34.0)
MCHC: 32.7 g/dL (ref 30.0–36.0)
MCV: 97.1 fL (ref 80.0–100.0)
Monocytes Absolute: 0.6 10*3/uL (ref 0.1–1.0)
Monocytes Relative: 12 %
Neutro Abs: 2.8 10*3/uL (ref 1.7–7.7)
Neutrophils Relative %: 60 %
Platelet Count: 177 10*3/uL (ref 150–400)
RBC: 3.81 MIL/uL — ABNORMAL LOW (ref 3.87–5.11)
RDW: 15.8 % — ABNORMAL HIGH (ref 11.5–15.5)
WBC Count: 4.6 10*3/uL (ref 4.0–10.5)
nRBC: 0 % (ref 0.0–0.2)

## 2019-05-24 LAB — CMP (CANCER CENTER ONLY)
ALT: 9 U/L (ref 0–44)
AST: 16 U/L (ref 15–41)
Albumin: 3.6 g/dL (ref 3.5–5.0)
Alkaline Phosphatase: 103 U/L (ref 38–126)
Anion gap: 11 (ref 5–15)
BUN: 20 mg/dL (ref 8–23)
CO2: 22 mmol/L (ref 22–32)
Calcium: 9.5 mg/dL (ref 8.9–10.3)
Chloride: 107 mmol/L (ref 98–111)
Creatinine: 0.99 mg/dL (ref 0.44–1.00)
GFR, Est AFR Am: >60 mL/min (ref >60)
GFR, Estimated: 58 mL/min — ABNORMAL LOW (ref >60)
Glucose, Bld: 117 mg/dL — ABNORMAL HIGH (ref 70–99)
Potassium: 3.7 mmol/L (ref 3.5–5.1)
Sodium: 140 mmol/L (ref 135–145)
Total Bilirubin: 0.5 mg/dL (ref 0.3–1.2)
Total Protein: 7.3 g/dL (ref 6.5–8.1)

## 2019-05-24 LAB — CEA (IN HOUSE-CHCC): CEA (CHCC-In House): 4.28 ng/mL (ref 0.00–5.00)

## 2019-05-24 MED ORDER — SODIUM CHLORIDE 0.9 % IV SOLN
2400.0000 mg/m2 | INTRAVENOUS | Status: DC
  Administered 2019-05-24: 13:00:00 4250 mg via INTRAVENOUS
  Filled 2019-05-24: qty 85

## 2019-05-24 MED ORDER — LIDOCAINE VISCOUS HCL 2 % MT SOLN: 15.0000 mL | OROMUCOSAL | 1 refills | Status: AC | PRN

## 2019-05-24 MED ORDER — SODIUM CHLORIDE 0.9 % IV SOLN
5.0000 mg/kg | Freq: Once | INTRAVENOUS | Status: AC
  Administered 2019-05-24: 12:00:00 350 mg via INTRAVENOUS
  Filled 2019-05-24: qty 14

## 2019-05-24 MED ORDER — PROCHLORPERAZINE MALEATE 10 MG PO TABS
10.0000 mg | ORAL_TABLET | Freq: Four times a day (QID) | ORAL | Status: DC | PRN
  Administered 2019-05-24: 10 mg via ORAL

## 2019-05-24 MED ORDER — FLUOROURACIL CHEMO INJECTION 2.5 GM/50ML
400.0000 mg/m2 | Freq: Once | INTRAVENOUS | Status: AC
  Administered 2019-05-24: 700 mg via INTRAVENOUS
  Filled 2019-05-24: qty 14

## 2019-05-24 MED ORDER — LIDOCAINE-PRILOCAINE 2.5-2.5 % EX KIT: 1.0000 "application " | PACK | CUTANEOUS | 3 refills | Status: AC | PRN

## 2019-05-24 MED ORDER — SODIUM CHLORIDE 0.9% FLUSH
10.0000 mL | Freq: Once | INTRAVENOUS | Status: AC
  Administered 2019-05-24: 10 mL
  Filled 2019-05-24: qty 10

## 2019-05-24 MED ORDER — HEPARIN SOD (PORK) LOCK FLUSH 100 UNIT/ML IV SOLN
500.0000 [IU] | Freq: Once | INTRAVENOUS | Status: DC | PRN
  Filled 2019-05-24: qty 5

## 2019-05-24 MED ORDER — PROCHLORPERAZINE MALEATE 10 MG PO TABS
ORAL_TABLET | ORAL | Status: AC
  Filled 2019-05-24: qty 1

## 2019-05-24 MED ORDER — LEUCOVORIN CALCIUM INJECTION 350 MG
400.0000 mg/m2 | Freq: Once | INTRAVENOUS | Status: AC
  Administered 2019-05-24: 712 mg via INTRAVENOUS
  Filled 2019-05-24: qty 35.6

## 2019-05-24 MED ORDER — SODIUM CHLORIDE 0.9 % IV SOLN
Freq: Once | INTRAVENOUS | Status: AC
  Administered 2019-05-24: 12:00:00 via INTRAVENOUS
  Filled 2019-05-24: qty 250

## 2019-05-24 MED ORDER — SODIUM CHLORIDE 0.9% FLUSH
10.0000 mL | INTRAVENOUS | Status: DC | PRN
  Filled 2019-05-24: qty 10

## 2019-05-24 MED ORDER — SODIUM CHLORIDE 0.9 % IV SOLN
Freq: Once | INTRAVENOUS | Status: AC
  Administered 2019-05-24: 11:00:00 via INTRAVENOUS
  Filled 2019-05-24: qty 250

## 2019-05-24 MED ORDER — LIDOCAINE-PRILOCAINE 2.5-2.5 % EX KIT: 1.0000 "application " | PACK | CUTANEOUS | 3 refills | Status: DC | PRN

## 2019-05-24 NOTE — Progress Notes (Signed)
Hematology and Oncology Follow Up Visit  Margaret Elliott [id number] 1951/03/19    05/24/19   Principle Diagnosis: 68 year old woman with colon cancer diagnosed in 2014.  She initially had stage IV disease with hepatic metastasis and subsequently noted to have NRAS mutated peritoneal involvement in 2017.   Prior Therapy:  She is status post laparoscopic laparotomy right hemicolectomy with ileocolonic anastomosis done on December 09, 2012.   FOLFOX and a Avastin chemotherapy started 01/18/2013. She is S/P 12 cycles completed in 05/2013.   She is a status post microwave ablation of metastatic lesion in the posterior segment of the right lobe of the liver completed on 09/28/2014 and repeated on 11/30/2014.  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  FOLFIRI and a Avastin salvage therapy started on 12/10/2015.  Therapy discontinued and maintained on 5-FU leucovorin with a Avastin only till March 2020.  FOLFIRI and a Avastin restarted on 10/05/2018.   She is status post 12 cycles of therapy.    Current therapy:    Maintenance 5-FU, leucovorin and a Avastin maintenance restarted on October 7 of 2020.  She is here for the next cycle of therapy.   Interim History:  [name] presents today for a follow-up evaluation.  Since the last visit, she reports no major changes in her health.  She is experiencing slight worsening neuropathy but overall tolerates chemotherapy well.  She denies any nausea, vomiting or abdominal pain.  She does report oral mucositis which is manageable with Magic mouthwash.  Her performance status quality of life remains unchanged.  She denies any abdominal pain changes in her bowel habits.  Patient denied any alteration mental status, neuropathy, confusion or dizziness.  Denies any headaches or lethargy.  Denies any night sweats, weight loss or changes in appetite.  Denied orthopnea, dyspnea on  exertion or chest discomfort.  Denies shortness of breath, difficulty breathing hemoptysis or cough.  Denies any abdominal distention, nausea, early satiety or dyspepsia.  Denies any hematuria, frequency, dysuria or nocturia.  Denies any skin irritation, dryness or rash.  Denies any ecchymosis or petechiae.  Denies any lymphadenopathy or clotting.  Denies any heat or cold intolerance.  Denies any anxiety or depression.  Remaining review of system is negative.                  Medications: Without any changes on review. Current Outpatient Prescriptions  Medication Sig Dispense Refill  . acetaminophen (TYLENOL) 500 MG tablet Take 500 mg by mouth every 6 (six) hours as needed for pain.      . diphenhydrAMINE (BENADRYL) 25 mg capsule Take 25-50 mg by mouth daily as needed for itching. For allergic reaction      . lidocaine-prilocaine (EMLA) cream Apply 1 application topically as needed. Apply approx 1/2 tsp to skin over port, prior to chemotherapy treatments  1 kit  3  . Multiple Vitamin (MULTIVITAMIN) tablet Take 1 tablet by mouth daily.      . ondansetron (ZOFRAN) 8 MG tablet Take 1 tablet (8 mg total) by mouth every 8 (eight) hours as needed for nausea.  30 tablet  1   No current facility-administered medications for this visit.     Allergies: No Known Allergies  Past Medical History, Surgical history, Social history, and Family History updated without any changes.    Physical Exam:     Blood pressure (!) 163/99, pulse 87, temperature 98.2 F (36.8 C), temperature  source Temporal, resp. rate 18, height 5' 4" (1.626 m), weight 148 lb 6.4 oz (67.3 kg), SpO2 100 %.     ECOG: 0     General appearance: Alert, awake without any distress. Head: Atraumatic without abnormalities Oropharynx: Without any thrush or ulcers. Eyes: No scleral icterus. Lymph nodes: No lymphadenopathy noted in the cervical, supraclavicular, or axillary nodes Heart:regular rate and rhythm,  without any murmurs or gallops.   Lung: Clear to auscultation without any rhonchi, wheezes or dullness to percussion. Abdomin: Soft, nontender without any shifting dullness or ascites. Musculoskeletal: No clubbing or cyanosis. Neurological: No motor or sensory deficits. Skin: No rashes or lesions. Psychiatric: Mood and affect appeared normal.               CBC    Component Value Date/Time   WBC 3.5 (L) 05/10/2019 0900   WBC 4.2 09/08/2017 0834   RBC 3.89 05/10/2019 0900   HGB 12.3 05/10/2019 0900   HGB 12.3 05/26/2017 0809   HCT 38.3 05/10/2019 0900   HCT 37.6 05/26/2017 0809   PLT 197 05/10/2019 0900   PLT 180 05/26/2017 0809   MCV 98.5 05/10/2019 0900   MCV 100.3 05/26/2017 0809   MCH 31.6 05/10/2019 0900   MCHC 32.1 05/10/2019 0900   RDW 15.7 (H) 05/10/2019 0900   RDW 17.4 (H) 05/26/2017 0809   LYMPHSABS 1.2 05/10/2019 0900   LYMPHSABS 1.2 05/26/2017 0809   MONOABS 0.6 05/10/2019 0900   MONOABS 0.5 05/26/2017 0809   EOSABS 0.1 05/10/2019 0900   EOSABS 0.1 05/26/2017 0809   BASOSABS 0.0 05/10/2019 0900   BASOSABS 0.0 05/26/2017 0809      Results for Margaret, Elliott (MRN [id number]) as of 05/24/2019 10:19  Ref. Range 04/26/2019 09:12 05/10/2019 09:00  CEA ([facility]) Latest Ref Range: 0.00 - 5.00 ng/mL 3.49 4.20        Impression and Plan:  68 year old woman with:   1.  Stage IV colon cancer with hepatic and peritoneal metastasis diagnosed in 2014.    She is currently on maintenance chemotherapy with 5-FU leucovorin without any major complications.  Her CEA level and overall disease status has been relatively stable.  She is scheduled to have a repeat CT scan before the next visit.  Alternative treatment options were reviewed which including restarting CPT-11 based chemotherapy versus different salvage options were reviewed.  She is agreeable with this plan.    2.  IV access: Port-A-Cath currently in place and in use without any issues.   3.  Neuropathy: Slightly worsened related to her previous chemotherapy exposure.  We will not consider treatment break from chemotherapy which will certainly help with neuropathy.  4.  Abdominal pain: No recent exacerbation noted at this time.  This is related to previous peritoneal involvement by her cancer.  5.  Anxiety: Mood is stable without any recent exacerbation.  6.  Prognosis: Therapy remains palliative although aggressive measures are warranted at this time.  7. Followup: Will be in 2 weeks for repeat follow-up after imaging studies.  25 minutes spent today face-to-face with the patient.  More than 50% of today's visit was spent on updating her disease status, reviewing treatment options as well as coordinating future plan of care.  Zola Button MD 05/24/19

## 2019-05-24 NOTE — Patient Instructions (Signed)
[facility] Discharge Instructions for Patients Receiving Chemotherapy  Today you received the following chemotherapy agents: bevacizumab/leucovorin/fluorouracil.  To help prevent nausea and vomiting after your treatment, we encourage you to take your nausea medication as directed.   If you develop nausea and vomiting that is not controlled by your nausea medication, call the clinic.   BELOW ARE SYMPTOMS THAT SHOULD BE REPORTED IMMEDIATELY:  *FEVER GREATER THAN 100.5 F  *CHILLS WITH OR WITHOUT FEVER  NAUSEA AND VOMITING THAT IS NOT CONTROLLED WITH YOUR NAUSEA MEDICATION  *UNUSUAL SHORTNESS OF BREATH  *UNUSUAL BRUISING OR BLEEDING  TENDERNESS IN MOUTH AND THROAT WITH OR WITHOUT PRESENCE OF ULCERS  *URINARY PROBLEMS  *BOWEL PROBLEMS  UNUSUAL RASH Items with * indicate a potential emergency and should be followed up as soon as possible.  Feel free to call the clinic should you have any questions or concerns. The clinic phone number is [phone].  Please show the CHEMO ALERT CARD at check-in to the Emergency Department and triage nurse.   

## 2019-05-25 ENCOUNTER — Telehealth: Payer: Self-pay | Admitting: Oncology

## 2019-05-25 NOTE — Telephone Encounter (Signed)
No los per 12/2.

## 2019-05-26 ENCOUNTER — Other Ambulatory Visit: Payer: Self-pay

## 2019-05-26 ENCOUNTER — Inpatient Hospital Stay: Payer: Medicare Other

## 2019-05-26 VITALS — BP 158/72 | HR 78 | Temp 98.2°F | Resp 18

## 2019-05-26 DIAGNOSIS — K6389 Other specified diseases of intestine: Secondary | ICD-10-CM

## 2019-05-26 DIAGNOSIS — C189 Malignant neoplasm of colon, unspecified: Secondary | ICD-10-CM

## 2019-05-26 DIAGNOSIS — Z5111 Encounter for antineoplastic chemotherapy: Secondary | ICD-10-CM | POA: Diagnosis not present

## 2019-05-26 DIAGNOSIS — K769 Liver disease, unspecified: Secondary | ICD-10-CM

## 2019-05-26 MED ORDER — HEPARIN SOD (PORK) LOCK FLUSH 100 UNIT/ML IV SOLN
500.0000 [IU] | Freq: Once | INTRAVENOUS | Status: AC | PRN
  Administered 2019-05-26: 500 [IU]
  Filled 2019-05-26: qty 5

## 2019-05-26 MED ORDER — SODIUM CHLORIDE 0.9% FLUSH
10.0000 mL | INTRAVENOUS | Status: DC | PRN
  Administered 2019-05-26: 10 mL
  Filled 2019-05-26: qty 10

## 2019-05-31 ENCOUNTER — Other Ambulatory Visit: Payer: Self-pay

## 2019-05-31 ENCOUNTER — Encounter (HOSPITAL_COMMUNITY): Payer: Self-pay

## 2019-05-31 ENCOUNTER — Ambulatory Visit (HOSPITAL_COMMUNITY)
Admission: RE | Admit: 2019-05-31 | Discharge: 2019-05-31 | Disposition: A | Discharge status: Home / Self Care | Payer: Medicare Other | Source: Ambulatory Visit | Attending: Oncology | Admitting: Oncology

## 2019-05-31 DIAGNOSIS — C20 Malignant neoplasm of rectum: Secondary | ICD-10-CM | POA: Diagnosis not present

## 2019-05-31 DIAGNOSIS — C189 Malignant neoplasm of colon, unspecified: Secondary | ICD-10-CM | POA: Insufficient documentation

## 2019-05-31 MED ORDER — HEPARIN SOD (PORK) LOCK FLUSH 100 UNIT/ML IV SOLN
INTRAVENOUS | Status: AC
  Filled 2019-05-31: qty 5

## 2019-05-31 MED ORDER — IOHEXOL 300 MG/ML  SOLN
100.0000 mL | Freq: Once | INTRAMUSCULAR | Status: AC | PRN
  Administered 2019-05-31: 100 mL via INTRAVENOUS

## 2019-05-31 MED ORDER — SODIUM CHLORIDE (PF) 0.9 % IJ SOLN
INTRAMUSCULAR | Status: AC
  Filled 2019-05-31: qty 50

## 2019-05-31 MED ORDER — HEPARIN SOD (PORK) LOCK FLUSH 100 UNIT/ML IV SOLN
500.0000 [IU] | Freq: Once | INTRAVENOUS | Status: AC
  Administered 2019-05-31: 500 [IU] via INTRAVENOUS

## 2019-06-06 ENCOUNTER — Inpatient Hospital Stay (HOSPITAL_COMMUNITY): Payer: No Typology Code available for payment source

## 2019-06-06 ENCOUNTER — Emergency Department (HOSPITAL_COMMUNITY): Payer: No Typology Code available for payment source

## 2019-06-06 ENCOUNTER — Other Ambulatory Visit: Payer: Self-pay

## 2019-06-06 ENCOUNTER — Inpatient Hospital Stay (HOSPITAL_COMMUNITY)
Admission: EM | Admit: 2019-06-06 | Discharge: 2019-06-10 | DRG: 481 | Disposition: A | Discharge status: Home Health | Payer: No Typology Code available for payment source | Attending: Family Medicine | Admitting: Family Medicine

## 2019-06-06 DIAGNOSIS — Z9049 Acquired absence of other specified parts of digestive tract: Secondary | ICD-10-CM | POA: Diagnosis not present

## 2019-06-06 DIAGNOSIS — Z9221 Personal history of antineoplastic chemotherapy: Secondary | ICD-10-CM

## 2019-06-06 DIAGNOSIS — Z8249 Family history of ischemic heart disease and other diseases of the circulatory system: Secondary | ICD-10-CM

## 2019-06-06 DIAGNOSIS — Z90710 Acquired absence of both cervix and uterus: Secondary | ICD-10-CM | POA: Diagnosis not present

## 2019-06-06 DIAGNOSIS — N1831 Chronic kidney disease, stage 3a: Secondary | ICD-10-CM | POA: Diagnosis present

## 2019-06-06 DIAGNOSIS — D62 Acute posthemorrhagic anemia: Secondary | ICD-10-CM | POA: Diagnosis not present

## 2019-06-06 DIAGNOSIS — C787 Secondary malignant neoplasm of liver and intrahepatic bile duct: Secondary | ICD-10-CM | POA: Diagnosis present

## 2019-06-06 DIAGNOSIS — Z825 Family history of asthma and other chronic lower respiratory diseases: Secondary | ICD-10-CM | POA: Diagnosis not present

## 2019-06-06 DIAGNOSIS — C189 Malignant neoplasm of colon, unspecified: Secondary | ICD-10-CM | POA: Insufficient documentation

## 2019-06-06 DIAGNOSIS — Z20828 Contact with and (suspected) exposure to other viral communicable diseases: Secondary | ICD-10-CM | POA: Diagnosis present

## 2019-06-06 DIAGNOSIS — Y92410 Unspecified street and highway as the place of occurrence of the external cause: Secondary | ICD-10-CM | POA: Diagnosis not present

## 2019-06-06 DIAGNOSIS — S72141A Displaced intertrochanteric fracture of right femur, initial encounter for closed fracture: Secondary | ICD-10-CM | POA: Diagnosis not present

## 2019-06-06 DIAGNOSIS — D630 Anemia in neoplastic disease: Secondary | ICD-10-CM | POA: Diagnosis present

## 2019-06-06 DIAGNOSIS — S0081XA Abrasion of other part of head, initial encounter: Secondary | ICD-10-CM | POA: Diagnosis present

## 2019-06-06 DIAGNOSIS — N179 Acute kidney failure, unspecified: Secondary | ICD-10-CM | POA: Diagnosis not present

## 2019-06-06 DIAGNOSIS — Z23 Encounter for immunization: Secondary | ICD-10-CM

## 2019-06-06 DIAGNOSIS — C799 Secondary malignant neoplasm of unspecified site: Secondary | ICD-10-CM

## 2019-06-06 DIAGNOSIS — T1490XA Injury, unspecified, initial encounter: Secondary | ICD-10-CM

## 2019-06-06 DIAGNOSIS — S72001A Fracture of unspecified part of neck of right femur, initial encounter for closed fracture: Secondary | ICD-10-CM

## 2019-06-06 DIAGNOSIS — C19 Malignant neoplasm of rectosigmoid junction: Secondary | ICD-10-CM | POA: Diagnosis not present

## 2019-06-06 DIAGNOSIS — Z79899 Other long term (current) drug therapy: Secondary | ICD-10-CM | POA: Diagnosis not present

## 2019-06-06 DIAGNOSIS — Z87891 Personal history of nicotine dependence: Secondary | ICD-10-CM

## 2019-06-06 DIAGNOSIS — S0083XA Contusion of other part of head, initial encounter: Secondary | ICD-10-CM

## 2019-06-06 DIAGNOSIS — S728X1A Other fracture of right femur, initial encounter for closed fracture: Secondary | ICD-10-CM | POA: Diagnosis present

## 2019-06-06 DIAGNOSIS — Z85038 Personal history of other malignant neoplasm of large intestine: Secondary | ICD-10-CM

## 2019-06-06 DIAGNOSIS — Z419 Encounter for procedure for purposes other than remedying health state, unspecified: Secondary | ICD-10-CM

## 2019-06-06 DIAGNOSIS — Z03818 Encounter for observation for suspected exposure to other biological agents ruled out: Secondary | ICD-10-CM | POA: Diagnosis not present

## 2019-06-06 HISTORY — DX: Fracture of unspecified part of neck of right femur, initial encounter for closed fracture: S72.001A

## 2019-06-06 LAB — COMPREHENSIVE METABOLIC PANEL
ALT: 16 U/L (ref 0–44)
AST: 25 U/L (ref 15–41)
Albumin: 3.3 g/dL — ABNORMAL LOW (ref 3.5–5.0)
Alkaline Phosphatase: 47 U/L (ref 38–126)
Anion gap: 9 (ref 5–15)
BUN: 14 mg/dL (ref 8–23)
CO2: 25 mmol/L (ref 22–32)
Calcium: 9.3 mg/dL (ref 8.9–10.3)
Chloride: 106 mmol/L (ref 98–111)
Creatinine, Ser: 1.09 mg/dL — ABNORMAL HIGH (ref 0.44–1.00)
GFR calc Af Amer: 60 mL/min — ABNORMAL LOW (ref >60)
GFR calc non Af Amer: 52 mL/min — ABNORMAL LOW (ref >60)
Glucose, Bld: 136 mg/dL — ABNORMAL HIGH (ref 70–99)
Potassium: 3.9 mmol/L (ref 3.5–5.1)
Sodium: 140 mmol/L (ref 135–145)
Total Bilirubin: 0.8 mg/dL (ref 0.3–1.2)
Total Protein: 6.2 g/dL — ABNORMAL LOW (ref 6.5–8.1)

## 2019-06-06 LAB — RESPIRATORY PANEL BY RT PCR (FLU A&B, COVID)
Influenza A by PCR: NEGATIVE
Influenza B by PCR: NEGATIVE
SARS Coronavirus 2 by RT PCR: NEGATIVE

## 2019-06-06 LAB — CBC WITH DIFFERENTIAL/PLATELET
Abs Immature Granulocytes: 0.04 10*3/uL (ref 0.00–0.07)
Basophils Absolute: 0 10*3/uL (ref 0.0–0.1)
Basophils Relative: 0 %
Eosinophils Absolute: 0 10*3/uL (ref 0.0–0.5)
Eosinophils Relative: 0 %
HCT: 33.9 % — ABNORMAL LOW (ref 36.0–46.0)
Hemoglobin: 11 g/dL — ABNORMAL LOW (ref 12.0–15.0)
Immature Granulocytes: 1 %
Lymphocytes Relative: 6 %
Lymphs Abs: 0.6 10*3/uL — ABNORMAL LOW (ref 0.7–4.0)
MCH: 32.9 pg (ref 26.0–34.0)
MCHC: 32.4 g/dL (ref 30.0–36.0)
MCV: 101.5 fL — ABNORMAL HIGH (ref 80.0–100.0)
Monocytes Absolute: 0.7 10*3/uL (ref 0.1–1.0)
Monocytes Relative: 8 %
Neutro Abs: 7.5 10*3/uL (ref 1.7–7.7)
Neutrophils Relative %: 85 %
Platelets: 182 10*3/uL (ref 150–400)
RBC: 3.34 MIL/uL — ABNORMAL LOW (ref 3.87–5.11)
RDW: 15.9 % — ABNORMAL HIGH (ref 11.5–15.5)
WBC: 8.8 10*3/uL (ref 4.0–10.5)
nRBC: 0 % (ref 0.0–0.2)

## 2019-06-06 LAB — HIV ANTIBODY (ROUTINE TESTING W REFLEX): HIV Screen 4th Generation wRfx: NONREACTIVE

## 2019-06-06 MED ORDER — FENTANYL CITRATE (PF) 100 MCG/2ML IJ SOLN: 50.0000 ug | Freq: Once | INTRAMUSCULAR | Status: DC

## 2019-06-06 MED ORDER — MORPHINE SULFATE (PF) 4 MG/ML IV SOLN
4.0000 mg | INTRAVENOUS | Status: DC | PRN
  Administered 2019-06-06: 4 mg via INTRAVENOUS
  Filled 2019-06-06: qty 1

## 2019-06-06 MED ORDER — IOHEXOL 300 MG/ML  SOLN
75.0000 mL | Freq: Once | INTRAMUSCULAR | Status: AC | PRN
  Administered 2019-06-06: 07:00:00 75 mL via INTRAVENOUS

## 2019-06-06 MED ORDER — POLYETHYLENE GLYCOL 3350 17 G PO PACK
17.0000 g | PACK | Freq: Every day | ORAL | Status: DC
  Administered 2019-06-06: 16:00:00 17 g via ORAL
  Filled 2019-06-06: qty 1

## 2019-06-06 MED ORDER — POVIDONE-IODINE 10 % EX SWAB: 2.0000 "application " | Freq: Once | CUTANEOUS | Status: DC

## 2019-06-06 MED ORDER — ONDANSETRON HCL 4 MG/2ML IJ SOLN: 4.0000 mg | Freq: Four times a day (QID) | INTRAMUSCULAR | Status: DC | PRN

## 2019-06-06 MED ORDER — MORPHINE SULFATE (PF) 4 MG/ML IV SOLN
4.0000 mg | Freq: Once | INTRAVENOUS | Status: AC
  Administered 2019-06-06: 06:00:00 4 mg via INTRAVENOUS
  Filled 2019-06-06: qty 1

## 2019-06-06 MED ORDER — ONDANSETRON HCL 4 MG PO TABS: 4.0000 mg | ORAL_TABLET | Freq: Four times a day (QID) | ORAL | Status: DC | PRN

## 2019-06-06 MED ORDER — ACETAMINOPHEN 325 MG PO TABS
650.0000 mg | ORAL_TABLET | Freq: Four times a day (QID) | ORAL | Status: DC
  Administered 2019-06-06 – 2019-06-08 (×7): 650 mg via ORAL
  Filled 2019-06-06 (×9): qty 2

## 2019-06-06 MED ORDER — MORPHINE SULFATE (PF) 4 MG/ML IV SOLN
4.0000 mg | INTRAVENOUS | Status: DC | PRN
  Administered 2019-06-06 – 2019-06-07 (×4): 4 mg via INTRAVENOUS
  Filled 2019-06-06 (×4): qty 1

## 2019-06-06 MED ORDER — ENOXAPARIN SODIUM 40 MG/0.4ML ~~LOC~~ SOLN
40.0000 mg | SUBCUTANEOUS | Status: DC
  Administered 2019-06-06: 12:00:00 40 mg via SUBCUTANEOUS
  Filled 2019-06-06: qty 0.4

## 2019-06-06 MED ORDER — CHLORHEXIDINE GLUCONATE 4 % EX LIQD
60.0000 mL | Freq: Once | CUTANEOUS | Status: DC
  Filled 2019-06-06: qty 60

## 2019-06-06 MED ORDER — TETANUS-DIPHTH-ACELL PERTUSSIS 5-2.5-18.5 LF-MCG/0.5 IM SUSP
0.5000 mL | Freq: Once | INTRAMUSCULAR | Status: AC
  Administered 2019-06-06: 08:00:00 0.5 mL via INTRAMUSCULAR
  Filled 2019-06-06: qty 0.5

## 2019-06-06 MED ORDER — ACETAMINOPHEN 650 MG RE SUPP: 650.0000 mg | Freq: Four times a day (QID) | RECTAL | Status: DC

## 2019-06-06 MED ORDER — ONDANSETRON HCL 4 MG/2ML IJ SOLN
4.0000 mg | Freq: Once | INTRAMUSCULAR | Status: AC
  Administered 2019-06-06: 06:00:00 4 mg via INTRAVENOUS
  Filled 2019-06-06: qty 2

## 2019-06-06 MED ORDER — ENSURE PRE-SURGERY PO LIQD
296.0000 mL | Freq: Once | ORAL | Status: AC
  Administered 2019-06-06: 296 mL via ORAL
  Filled 2019-06-06: qty 296

## 2019-06-06 NOTE — ED Notes (Signed)
Pt back from CT
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
## 2019-06-06 NOTE — ED Notes (Signed)
Lunch Tray Ordered @ 1126.  
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
## 2019-06-06 NOTE — Plan of Care (Signed)

## 2019-06-06 NOTE — ED Notes (Signed)
Pt going to CT from XR
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
## 2019-06-06 NOTE — ED Notes (Signed)
Patient transported to X-ray 

## 2019-06-07 ENCOUNTER — Inpatient Hospital Stay: Payer: Medicare Other

## 2019-06-07 ENCOUNTER — Inpatient Hospital Stay: Payer: Medicare Other | Admitting: Oncology

## 2019-06-07 ENCOUNTER — Other Ambulatory Visit: Payer: Self-pay

## 2019-06-07 ENCOUNTER — Telehealth: Payer: Self-pay | Admitting: Oncology

## 2019-06-07 ENCOUNTER — Inpatient Hospital Stay (HOSPITAL_COMMUNITY): Payer: No Typology Code available for payment source | Admitting: Anesthesiology

## 2019-06-07 ENCOUNTER — Inpatient Hospital Stay (HOSPITAL_COMMUNITY): Payer: No Typology Code available for payment source

## 2019-06-07 ENCOUNTER — Encounter (HOSPITAL_COMMUNITY)
Admission: EM | Disposition: A | Discharge status: Home Health | Payer: Self-pay | Source: Home / Self Care | Attending: Family Medicine

## 2019-06-07 ENCOUNTER — Encounter (HOSPITAL_COMMUNITY): Payer: Self-pay | Admitting: Family Medicine

## 2019-06-07 HISTORY — PX: INTRAMEDULLARY (IM) NAIL INTERTROCHANTERIC: SHX5875

## 2019-06-07 LAB — BASIC METABOLIC PANEL
Anion gap: 8 (ref 5–15)
BUN: 21 mg/dL (ref 8–23)
CO2: 26 mmol/L (ref 22–32)
Calcium: 9.2 mg/dL (ref 8.9–10.3)
Chloride: 106 mmol/L (ref 98–111)
Creatinine, Ser: 1.73 mg/dL — ABNORMAL HIGH (ref 0.44–1.00)
GFR calc Af Amer: 34 mL/min — ABNORMAL LOW (ref >60)
GFR calc non Af Amer: 30 mL/min — ABNORMAL LOW (ref >60)
Glucose, Bld: 139 mg/dL — ABNORMAL HIGH (ref 70–99)
Potassium: 4.7 mmol/L (ref 3.5–5.1)
Sodium: 140 mmol/L (ref 135–145)

## 2019-06-07 LAB — CBC WITH DIFFERENTIAL/PLATELET
Abs Immature Granulocytes: 0.03 10*3/uL (ref 0.00–0.07)
Basophils Absolute: 0 10*3/uL (ref 0.0–0.1)
Basophils Relative: 0 %
Eosinophils Absolute: 0 10*3/uL (ref 0.0–0.5)
Eosinophils Relative: 1 %
HCT: 31.4 % — ABNORMAL LOW (ref 36.0–46.0)
Hemoglobin: 10.3 g/dL — ABNORMAL LOW (ref 12.0–15.0)
Immature Granulocytes: 1 %
Lymphocytes Relative: 21 %
Lymphs Abs: 1.4 10*3/uL (ref 0.7–4.0)
MCH: 32.9 pg (ref 26.0–34.0)
MCHC: 32.8 g/dL (ref 30.0–36.0)
MCV: 100.3 fL — ABNORMAL HIGH (ref 80.0–100.0)
Monocytes Absolute: 0.9 10*3/uL (ref 0.1–1.0)
Monocytes Relative: 14 %
Neutro Abs: 4.2 10*3/uL (ref 1.7–7.7)
Neutrophils Relative %: 63 %
Platelets: 163 10*3/uL (ref 150–400)
RBC: 3.13 MIL/uL — ABNORMAL LOW (ref 3.87–5.11)
RDW: 16.1 % — ABNORMAL HIGH (ref 11.5–15.5)
WBC: 6.5 10*3/uL (ref 4.0–10.5)
nRBC: 0 % (ref 0.0–0.2)

## 2019-06-07 LAB — PROTIME-INR
INR: 1.1 (ref 0.8–1.2)
Prothrombin Time: 14.5 seconds (ref 11.4–15.2)

## 2019-06-07 LAB — SURGICAL PCR SCREEN
MRSA, PCR: NEGATIVE
Staphylococcus aureus: NEGATIVE

## 2019-06-07 LAB — MRSA PCR SCREENING: MRSA by PCR: NEGATIVE

## 2019-06-07 SURGERY — FIXATION, FRACTURE, INTERTROCHANTERIC, WITH INTRAMEDULLARY ROD
Anesthesia: General | Laterality: Right

## 2019-06-07 MED ORDER — SODIUM CHLORIDE 0.9 % IV BOLUS
1000.0000 mL | Freq: Once | INTRAVENOUS | Status: AC
  Administered 2019-06-07: 1000 mL via INTRAVENOUS

## 2019-06-07 MED ORDER — KETOROLAC TROMETHAMINE 15 MG/ML IJ SOLN: 7.5000 mg | Freq: Four times a day (QID) | INTRAMUSCULAR | Status: DC

## 2019-06-07 MED ORDER — MENTHOL 3 MG MT LOZG: 1.0000 | LOZENGE | OROMUCOSAL | Status: DC | PRN

## 2019-06-07 MED ORDER — DOCUSATE SODIUM 100 MG PO CAPS
100.0000 mg | ORAL_CAPSULE | Freq: Two times a day (BID) | ORAL | Status: DC
  Administered 2019-06-07 – 2019-06-10 (×6): 100 mg via ORAL
  Filled 2019-06-07 (×6): qty 1

## 2019-06-07 MED ORDER — HYDROCODONE-ACETAMINOPHEN 5-325 MG PO TABS
1.0000 | ORAL_TABLET | ORAL | Status: DC | PRN
  Administered 2019-06-07 – 2019-06-08 (×3): 2 via ORAL
  Administered 2019-06-08: 1 via ORAL
  Administered 2019-06-09: 2 via ORAL
  Administered 2019-06-09: 04:00:00 1 via ORAL
  Filled 2019-06-07: qty 1
  Filled 2019-06-07 (×3): qty 2
  Filled 2019-06-07: qty 1
  Filled 2019-06-07: qty 2
  Filled 2019-06-07: qty 1

## 2019-06-07 MED ORDER — METHOCARBAMOL 500 MG PO TABS: 500.0000 mg | ORAL_TABLET | Freq: Four times a day (QID) | ORAL | Status: DC | PRN

## 2019-06-07 MED ORDER — MORPHINE SULFATE (PF) 2 MG/ML IV SOLN: 0.5000 mg | INTRAVENOUS | Status: DC | PRN

## 2019-06-07 MED ORDER — DEXAMETHASONE SODIUM PHOSPHATE 10 MG/ML IJ SOLN
INTRAMUSCULAR | Status: AC
  Filled 2019-06-07: qty 1

## 2019-06-07 MED ORDER — PROPOFOL 10 MG/ML IV BOLUS
INTRAVENOUS | Status: AC
  Filled 2019-06-07: qty 20

## 2019-06-07 MED ORDER — SENNA 8.6 MG PO TABS
1.0000 | ORAL_TABLET | Freq: Two times a day (BID) | ORAL | Status: DC
  Administered 2019-06-07 – 2019-06-10 (×6): 8.6 mg via ORAL
  Filled 2019-06-07 (×6): qty 1

## 2019-06-07 MED ORDER — FENTANYL CITRATE (PF) 250 MCG/5ML IJ SOLN
INTRAMUSCULAR | Status: AC
  Filled 2019-06-07: qty 5

## 2019-06-07 MED ORDER — ACETAMINOPHEN 160 MG/5ML PO SOLN: 325.0000 mg | ORAL | Status: DC | PRN

## 2019-06-07 MED ORDER — CEFAZOLIN SODIUM-DEXTROSE 2-4 GM/100ML-% IV SOLN
2.0000 g | Freq: Four times a day (QID) | INTRAVENOUS | Status: AC
  Administered 2019-06-07 – 2019-06-08 (×2): 2 g via INTRAVENOUS
  Filled 2019-06-07 (×2): qty 100

## 2019-06-07 MED ORDER — ROCURONIUM BROMIDE 10 MG/ML (PF) SYRINGE
PREFILLED_SYRINGE | INTRAVENOUS | Status: AC
  Filled 2019-06-07: qty 10

## 2019-06-07 MED ORDER — ROCURONIUM BROMIDE 10 MG/ML (PF) SYRINGE
PREFILLED_SYRINGE | INTRAVENOUS | Status: DC | PRN
  Administered 2019-06-07: 50 mg via INTRAVENOUS

## 2019-06-07 MED ORDER — LACTATED RINGERS IV SOLN: INTRAVENOUS | Status: DC

## 2019-06-07 MED ORDER — ACETAMINOPHEN 325 MG PO TABS: 325.0000 mg | ORAL_TABLET | ORAL | Status: DC | PRN

## 2019-06-07 MED ORDER — DEXAMETHASONE SODIUM PHOSPHATE 10 MG/ML IJ SOLN
INTRAMUSCULAR | Status: DC | PRN
  Administered 2019-06-07: 10 mg via INTRAVENOUS

## 2019-06-07 MED ORDER — LIDOCAINE 2% (20 MG/ML) 5 ML SYRINGE
INTRAMUSCULAR | Status: DC | PRN
  Administered 2019-06-07: 80 mg via INTRAVENOUS

## 2019-06-07 MED ORDER — 0.9 % SODIUM CHLORIDE (POUR BTL) OPTIME
TOPICAL | Status: DC | PRN
  Administered 2019-06-07: 1000 mL

## 2019-06-07 MED ORDER — SUCCINYLCHOLINE CHLORIDE 200 MG/10ML IV SOSY
PREFILLED_SYRINGE | INTRAVENOUS | Status: AC
  Filled 2019-06-07: qty 10

## 2019-06-07 MED ORDER — ONDANSETRON HCL 4 MG/2ML IJ SOLN
INTRAMUSCULAR | Status: DC | PRN
  Administered 2019-06-07: 4 mg via INTRAVENOUS

## 2019-06-07 MED ORDER — MIDAZOLAM HCL 2 MG/2ML IJ SOLN
INTRAMUSCULAR | Status: AC
  Filled 2019-06-07: qty 2

## 2019-06-07 MED ORDER — ONDANSETRON HCL 4 MG/2ML IJ SOLN: 4.0000 mg | Freq: Four times a day (QID) | INTRAMUSCULAR | Status: DC | PRN

## 2019-06-07 MED ORDER — METOCLOPRAMIDE HCL 5 MG PO TABS: 5.0000 mg | ORAL_TABLET | Freq: Three times a day (TID) | ORAL | Status: DC | PRN

## 2019-06-07 MED ORDER — FENTANYL CITRATE (PF) 100 MCG/2ML IJ SOLN: 25.0000 ug | INTRAMUSCULAR | Status: DC | PRN

## 2019-06-07 MED ORDER — SUGAMMADEX SODIUM 200 MG/2ML IV SOLN
INTRAVENOUS | Status: DC | PRN
  Administered 2019-06-07: 200 mg via INTRAVENOUS

## 2019-06-07 MED ORDER — FENTANYL CITRATE (PF) 250 MCG/5ML IJ SOLN
INTRAMUSCULAR | Status: DC | PRN
  Administered 2019-06-07 (×3): 25 ug via INTRAVENOUS
  Administered 2019-06-07: 100 ug via INTRAVENOUS
  Administered 2019-06-07: 50 ug via INTRAVENOUS
  Administered 2019-06-07: 25 ug via INTRAVENOUS

## 2019-06-07 MED ORDER — PHENYLEPHRINE 40 MCG/ML (10ML) SYRINGE FOR IV PUSH (FOR BLOOD PRESSURE SUPPORT)
PREFILLED_SYRINGE | INTRAVENOUS | Status: DC | PRN
  Administered 2019-06-07: 200 ug via INTRAVENOUS

## 2019-06-07 MED ORDER — ENOXAPARIN SODIUM 30 MG/0.3ML ~~LOC~~ SOLN: 30.0000 mg | SUBCUTANEOUS | Status: DC

## 2019-06-07 MED ORDER — OXYCODONE HCL 5 MG PO TABS: 5.0000 mg | ORAL_TABLET | Freq: Once | ORAL | Status: DC | PRN

## 2019-06-07 MED ORDER — ENOXAPARIN SODIUM 40 MG/0.4ML ~~LOC~~ SOLN: 40.0000 mg | SUBCUTANEOUS | Status: DC

## 2019-06-07 MED ORDER — LACTATED RINGERS IV SOLN: INTRAVENOUS | Status: DC | PRN

## 2019-06-07 MED ORDER — ACETAMINOPHEN 325 MG PO TABS: 325.0000 mg | ORAL_TABLET | Freq: Four times a day (QID) | ORAL | Status: DC | PRN

## 2019-06-07 MED ORDER — METHOCARBAMOL 1000 MG/10ML IJ SOLN
500.0000 mg | Freq: Four times a day (QID) | INTRAVENOUS | Status: DC | PRN
  Filled 2019-06-07: qty 5

## 2019-06-07 MED ORDER — SODIUM CHLORIDE 0.9 % IV SOLN
100.0000 mL/h | INTRAVENOUS | Status: AC
  Administered 2019-06-07: 13:00:00 100 mL/h via INTRAVENOUS

## 2019-06-07 MED ORDER — ONDANSETRON HCL 4 MG/2ML IJ SOLN: 4.0000 mg | Freq: Once | INTRAMUSCULAR | Status: DC | PRN

## 2019-06-07 MED ORDER — PROPOFOL 10 MG/ML IV BOLUS
INTRAVENOUS | Status: DC | PRN
  Administered 2019-06-07: 140 mg via INTRAVENOUS

## 2019-06-07 MED ORDER — SUCCINYLCHOLINE CHLORIDE 200 MG/10ML IV SOSY
PREFILLED_SYRINGE | INTRAVENOUS | Status: DC | PRN
  Administered 2019-06-07: 100 mg via INTRAVENOUS

## 2019-06-07 MED ORDER — MEPERIDINE HCL 25 MG/ML IJ SOLN: 6.2500 mg | INTRAMUSCULAR | Status: DC | PRN

## 2019-06-07 MED ORDER — METOCLOPRAMIDE HCL 5 MG/ML IJ SOLN: 5.0000 mg | Freq: Three times a day (TID) | INTRAMUSCULAR | Status: DC | PRN

## 2019-06-07 MED ORDER — CEFAZOLIN SODIUM-DEXTROSE 2-4 GM/100ML-% IV SOLN
2.0000 g | INTRAVENOUS | Status: AC
  Administered 2019-06-07: 16:00:00 2 g via INTRAVENOUS
  Filled 2019-06-07: qty 100

## 2019-06-07 MED ORDER — MIDAZOLAM HCL 2 MG/2ML IJ SOLN
INTRAMUSCULAR | Status: DC | PRN
  Administered 2019-06-07: 2 mg via INTRAVENOUS

## 2019-06-07 MED ORDER — HYDROCODONE-ACETAMINOPHEN 7.5-325 MG PO TABS: 1.0000 | ORAL_TABLET | ORAL | Status: DC | PRN

## 2019-06-07 MED ORDER — ACETAMINOPHEN 500 MG PO TABS
1000.0000 mg | ORAL_TABLET | Freq: Once | ORAL | Status: AC
  Administered 2019-06-07: 1000 mg via ORAL
  Filled 2019-06-07: qty 2

## 2019-06-07 MED ORDER — ONDANSETRON HCL 4 MG/2ML IJ SOLN
INTRAMUSCULAR | Status: AC
  Filled 2019-06-07: qty 2

## 2019-06-07 MED ORDER — MUPIROCIN 2 % EX OINT
1.0000 "application " | TOPICAL_OINTMENT | Freq: Two times a day (BID) | CUTANEOUS | Status: DC
  Administered 2019-06-07: 1 via NASAL
  Filled 2019-06-07: qty 22

## 2019-06-07 MED ORDER — OXYCODONE HCL 5 MG/5ML PO SOLN: 5.0000 mg | Freq: Once | ORAL | Status: DC | PRN

## 2019-06-07 MED ORDER — PHENOL 1.4 % MT LIQD: 1.0000 | OROMUCOSAL | Status: DC | PRN

## 2019-06-07 MED ORDER — ONDANSETRON HCL 4 MG PO TABS: 4.0000 mg | ORAL_TABLET | Freq: Four times a day (QID) | ORAL | Status: DC | PRN

## 2019-06-07 SURGICAL SUPPLY — 53 items
ALCOHOL 70% 16 OZ (MISCELLANEOUS) ×3 IMPLANT
BIT DRILL AO GAMMA 4.2X340 (BIT) ×2 IMPLANT
BNDG COHESIVE 4X5 TAN STRL (GAUZE/BANDAGES/DRESSINGS) IMPLANT
CHLORAPREP W/TINT 26 (MISCELLANEOUS) ×3 IMPLANT
COVER PERINEAL POST (MISCELLANEOUS) ×3 IMPLANT
COVER SURGICAL LIGHT HANDLE (MISCELLANEOUS) ×3 IMPLANT
COVER WAND RF STERILE (DRAPES) ×1 IMPLANT
DERMABOND ADVANCED (GAUZE/BANDAGES/DRESSINGS) ×4
DERMABOND ADVANCED .7 DNX12 (GAUZE/BANDAGES/DRESSINGS) ×2 IMPLANT
DRAPE C-ARM 42X72 X-RAY (DRAPES) ×3 IMPLANT
DRAPE C-ARMOR (DRAPES) ×3 IMPLANT
DRAPE HALF SHEET 40X57 (DRAPES) ×3 IMPLANT
DRAPE IMP U-DRAPE 54X76 (DRAPES) ×6 IMPLANT
DRAPE STERI IOBAN 125X83 (DRAPES) ×3 IMPLANT
DRAPE U-SHAPE 47X51 STRL (DRAPES) ×6 IMPLANT
DRSG MEPILEX BORDER 4X4 (GAUZE/BANDAGES/DRESSINGS) ×6 IMPLANT
DRSG MEPILEX BORDER 4X8 (GAUZE/BANDAGES/DRESSINGS) ×2 IMPLANT
DRSG PAD ABDOMINAL 8X10 ST (GAUZE/BANDAGES/DRESSINGS) IMPLANT
ELECT REM PT RETURN 9FT ADLT (ELECTROSURGICAL) ×3
ELECTRODE REM PT RTRN 9FT ADLT (ELECTROSURGICAL) ×1 IMPLANT
FACESHIELD WRAPAROUND (MASK) ×3 IMPLANT
FACESHIELD WRAPAROUND OR TEAM (MASK) ×1 IMPLANT
GLOVE BIO SURGEON STRL SZ8.5 (GLOVE) ×6 IMPLANT
GLOVE BIOGEL PI IND STRL 8.5 (GLOVE) ×1 IMPLANT
GLOVE BIOGEL PI INDICATOR 8.5 (GLOVE) ×2
GOWN STRL REUS W/ TWL LRG LVL3 (GOWN DISPOSABLE) ×2 IMPLANT
GOWN STRL REUS W/TWL 2XL LVL3 (GOWN DISPOSABLE) ×3 IMPLANT
GOWN STRL REUS W/TWL LRG LVL3 (GOWN DISPOSABLE) ×4
K-WIRE  3.2X450M STR (WIRE) ×4
K-WIRE 3.2X450M STR (WIRE) ×2
KIT TURNOVER KIT B (KITS) ×3 IMPLANT
KWIRE 3.2X450M STR (WIRE) IMPLANT
MANIFOLD NEPTUNE II (INSTRUMENTS) ×1 IMPLANT
MARKER SKIN DUAL TIP RULER LAB (MISCELLANEOUS) ×3 IMPLANT
NAIL TROCH GAMMA 11X18 (Nail) ×2 IMPLANT
NS IRRIG 1000ML POUR BTL (IV SOLUTION) ×3 IMPLANT
PACK GENERAL/GYN (CUSTOM PROCEDURE TRAY) ×3 IMPLANT
PACK UNIVERSAL I (CUSTOM PROCEDURE TRAY) ×3 IMPLANT
PAD ARMBOARD 7.5X6 YLW CONV (MISCELLANEOUS) ×4 IMPLANT
PADDING CAST ABS 4INX4YD NS (CAST SUPPLIES)
PADDING CAST ABS COTTON 4X4 ST (CAST SUPPLIES) IMPLANT
SCREW LAG GAMMA 3 95MM (Screw) ×2 IMPLANT
SCREW LOCKING T2 F/T  5X32.5MM (Screw) ×2 IMPLANT
SCREW LOCKING T2 F/T 5X32.5MM (Screw) IMPLANT
SUT MNCRL AB 3-0 PS2 27 (SUTURE) ×3 IMPLANT
SUT MON AB 2-0 CT1 27 (SUTURE) ×3 IMPLANT
SUT MON AB 2-0 CT1 36 (SUTURE) ×2 IMPLANT
SUT VIC AB 1 CT1 27 (SUTURE) ×2
SUT VIC AB 1 CT1 27XBRD ANBCTR (SUTURE) ×1 IMPLANT
SUT VIC AB 2-0 CT1 27 (SUTURE) ×2
SUT VIC AB 2-0 CT1 TAPERPNT 27 (SUTURE) IMPLANT
TOWEL GREEN STERILE (TOWEL DISPOSABLE) ×3 IMPLANT
TOWEL GREEN STERILE FF (TOWEL DISPOSABLE) ×1 IMPLANT

## 2019-06-07 NOTE — Telephone Encounter (Signed)
Scheduled appt per 12/15 sch message - unable to reach pt . Called and left message with new appt date and time

## 2019-06-07 NOTE — Plan of Care (Signed)
  Problem: Education: Goal: Knowledge of General Education information will improve Description: Including pain rating scale, medication(s)/side effects and non-pharmacologic comfort measures 06/07/2019 1607 by Stevan Born, RN Outcome: Progressing 06/07/2019 1606 by Stevan Born, RN Outcome: Progressing   Problem: Clinical Measurements: Goal: Will remain free from infection Outcome: Progressing   Problem: Pain Managment: Goal: General experience of comfort will improve 06/07/2019 1607 by Stevan Born, RN Outcome: Progressing 06/07/2019 1606 by Stevan Born, RN Outcome: Progressing   Problem: Safety: Goal: Ability to remain free from injury will improve 06/07/2019 1607 by Stevan Born, RN Outcome: Progressing 06/07/2019 1606 by Stevan Born, RN Outcome: Progressing   Problem: Skin Integrity: Goal: Risk for impaired skin integrity will decrease 06/07/2019 1607 by Stevan Born, RN Outcome: Progressing 06/07/2019 1606 by Stevan Born, RN Outcome: Progressing   Problem: Education: Goal: Verbalization of understanding the information provided (i.e., activity precautions, restrictions, etc) will improve Outcome: Progressing   Problem: Activity: Goal: Ability to ambulate and perform ADLs will improve Outcome: Progressing   Problem: Self-Concept: Goal: Ability to maintain and perform role responsibilities to the fullest extent possible will improve Outcome: Progressing

## 2019-06-07 NOTE — Anesthesia Preprocedure Evaluation (Addendum)
Anesthesia Evaluation  Patient identified by MRN, date of birth, ID band Patient awake    Reviewed: Allergy & Precautions, NPO status , Patient's Chart, lab work & pertinent test results  Airway Mallampati: III  TM Distance: >3 FB Neck ROM: Full    Dental  (+) Dental Advisory Given, Caps, Missing,    Pulmonary former smoker,    Pulmonary exam normal breath sounds clear to auscultation       Cardiovascular negative cardio ROS Normal cardiovascular exam Rhythm:Regular Rate:Normal     Neuro/Psych negative neurological ROS     GI/Hepatic Neg liver ROS, Colon cancer s/p chemotherapy    Endo/Other  negative endocrine ROS  Renal/GU negative Renal ROS     Musculoskeletal Closed right hip fracture   Abdominal   Peds  Hematology  (+) Blood dyscrasia, anemia ,   Anesthesia Other Findings Day of surgery medications reviewed with the patient.  Reproductive/Obstetrics                            Anesthesia Physical Anesthesia Plan  ASA: II  Anesthesia Plan: General   Post-op Pain Management:    Induction: Intravenous  PONV Risk Score and Plan: 3 and Midazolam, Dexamethasone and Ondansetron  Airway Management Planned: Oral ETT  Additional Equipment:   Intra-op Plan:   Post-operative Plan: Extubation in OR  Informed Consent: I have reviewed the patients History and Physical, chart, labs and discussed the procedure including the risks, benefits and alternatives for the proposed anesthesia with the patient or authorized representative who has indicated his/her understanding and acceptance.     Dental advisory given  Plan Discussed with: CRNA  Anesthesia Plan Comments:         Anesthesia Quick Evaluation

## 2019-06-07 NOTE — Anesthesia Postprocedure Evaluation (Signed)
Anesthesia Post Note  Patient: Margaret Elliott  Procedure(s) Performed: INTRAMEDULLARY (IM) NAIL INTERTROCHANTRIC (Right )     Patient location during evaluation: PACU Anesthesia Type: General Level of consciousness: awake and alert Pain management: pain level controlled Vital Signs Assessment: post-procedure vital signs reviewed and stable Respiratory status: spontaneous breathing, nonlabored ventilation, respiratory function stable and patient connected to nasal cannula oxygen Cardiovascular status: blood pressure returned to baseline and stable Postop Assessment: no apparent nausea or vomiting Anesthetic complications: no    Last Vitals:  Vitals:   06/07/19 1903 06/07/19 1913  BP: 134/65 124/65  Pulse: 91 98  Resp: 16 16  Temp: (!) 36.4 C 36.9 C  SpO2: 93% 97%    Last Pain:  Vitals:   06/07/19 2114  TempSrc:   PainSc: 8                  Tiajuana Amass
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
## 2019-06-07 NOTE — Transfer of Care (Signed)
Immediate Anesthesia Transfer of Care Note  Patient: Margaret Elliott  Procedure(s) Performed: INTRAMEDULLARY (IM) NAIL INTERTROCHANTRIC (Right )  Patient Location: PACU  Anesthesia Type:General  Level of Consciousness: awake, alert  and oriented  Airway & Oxygen Therapy: Patient Spontanous Breathing and Patient connected to face mask oxygen  Post-op Assessment: Report given to RN and Post -op Vital signs reviewed and stable  Post vital signs: Reviewed and stable  Last Vitals:  Vitals Value Taken Time  BP 107/75 06/07/19 1802  Temp 36.4 C 06/07/19 1802  Pulse 98 06/07/19 1802  Resp 14 06/07/19 1803  SpO2 100 % 06/07/19 1802  Vitals shown include unvalidated device data.  Last Pain:  Vitals:   06/07/19 1313  TempSrc:   PainSc: 5       Patients Stated Pain Goal: 2 ([id number])  Complications: No apparent anesthesia complications

## 2019-06-07 NOTE — Progress Notes (Signed)
Patient's HgB resulted as 7.9 (10.3). On-call family medicine provider paged. Patient is stable at this time. Awaiting response.

## 2019-06-07 NOTE — Anesthesia Procedure Notes (Signed)
Procedure Name: Intubation Date/Time: 06/07/2019 4:18 PM Performed by: Teressa Lower., CRNA Pre-anesthesia Checklist: Patient identified, Emergency Drugs available, Suction available and Patient being monitored Patient Re-evaluated:Patient Re-evaluated prior to induction Oxygen Delivery Method: Circle system utilized Preoxygenation: Pre-oxygenation with 100% oxygen Induction Type: IV induction Ventilation: Mask ventilation without difficulty Laryngoscope Size: Mac and 3 Grade View: Grade III Tube type: Oral Tube size: 7.0 mm Number of attempts: 1 Airway Equipment and Method: Stylet Placement Confirmation: ETT inserted through vocal cords under direct vision,  positive ETCO2 and breath sounds checked- equal and bilateral Secured at: 22 cm Tube secured with: Tape Dental Injury: Teeth and Oropharynx as per pre-operative assessment

## 2019-06-07 NOTE — Progress Notes (Signed)
OT Cancellation Note  Patient Details Name: Margaret Elliott MRN: [id number] DOB: 1951-03-12   Cancelled Treatment:    Reason Eval/Treat Not Completed: Active bedrest order. Plan for pt to go to the OR today for ORIF. Will f/u with pt after surgery and await updated activity orders prior to initiation of evaluation.  Emmit Alexanders [facility] 06/07/2019, 12:17 PM

## 2019-06-07 NOTE — Op Note (Signed)
OPERATIVE REPORT  SURGEON: Rod Can, MD   ASSISTANT: Katy Apo, RNFA.  PREOPERATIVE DIAGNOSIS: Right intertrochanteric femur fracture.   POSTOPERATIVE DIAGNOSIS: Right intertrochanteric femur fracture.   PROCEDURE: Intramedullary fixation, Right femur.   IMPLANTS: Stryker Gamma3 Hip Fracture Nail, 11 by 180 mm, 125 degrees. 10.5 x 95 mm Hip Fracture Nail Lag Screw. 5 x 32.5 mm distal interlocking screw 1.  ANESTHESIA:  General  ESTIMATED BLOOD LOSS:-100 mL    ANTIBIOTICS: 2 g Ancef.  DRAINS: None.  COMPLICATIONS: None.   CONDITION: PACU - hemodynamically stable.   BRIEF CLINICAL NOTE: Margaret Elliott is a 68 y.o. female who presented with an intertrochanteric femur fracture. The patient was admitted to the hospitalist service and underwent perioperative risk stratification and medical optimization. The risks, benefits, and alternatives to the procedure were explained, and the patient elected to proceed.  PROCEDURE IN DETAIL: Surgical site was marked by myself. The patient was taken to the operating room and anesthesia was induced on the bed. The patient was then transferred to the [facility] table and the nonoperative lower extremity was scissored underneath the operative side. The fracture was reduced with traction, internal rotation, and adduction. The hip was prepped and draped in the normal sterile surgical fashion. Timeout was called verifying side and site of surgery. Preop antibiotics were given with 60 minutes of beginning the procedure.  Fluoroscopy was used to define the patient's anatomy. Accessory incision was made longitudinally over the proximal femur, and a subperiosteal bone hook was placed to improve the reduction. A 4 cm incision was made just proximal to the tip of the greater trochanter. The awl was used to obtain the standard starting point for a trochanteric entry nail under fluoroscopic control. The guidepin was placed. The entry reamer was used to open  the proximal femur.  On the back table, the nail was assembled onto the jig. The nail was placed into the femur without any difficulty. Through a separate stab incision, the cannula was placed down to the bone in preparation for the cephalomedullary device. A guidepin was placed into the femoral head using AP and lateral fluoroscopy views. The pin was measured, and then reaming was performed to the appropriate depth. The lag screw was inserted to the appropriate depth. The fracture was compressed through the jig. The setscrew was tightened and then loosened one quarter turn. A separate stab incision was created, and the distal interlocking screw was placed using standard AO technique. The jig was removed. Final AP and lateral fluoroscopy views were obtained to confirm fracture reduction and hardware placement. Tip apex distance was appropriate. There was no chondral penetration.  The wounds were copiously irrigated with saline. The wound was closed in layers with #1 Vicryl for the fascia, 2-0 Monocryl for the deep dermal layer, and 3-0 Monocryl subcuticular stitch. Glue was applied to the skin. Once the glue was fully hardened, sterile dressing was applied. The patient was then awakened from anesthesia and taken to the PACU in stable condition. Sponge needle and instrument counts were correct at the end of the case 2. There were no known complications.  We will readmit the patient to the hospitalist. Weightbearing status will be weightbearing as tolerated with a walker. We will begin Lovenox for DVT prophylaxis. The patient will work with physical therapy and undergo disposition planning.

## 2019-06-07 NOTE — Progress Notes (Signed)
PT Cancellation Note  Patient Details Name: KORIE BUSICK MRN: [id number] DOB: [birth year]   Cancelled Treatment:    Reason Eval/Treat Not Completed: Active bedrest order;Other (comment). Plan for pt to go to the OR today for ORIF. Will f/u with pt after surgery and await updated activity orders prior to initiation of evaluation.   Melvin 06/07/2019, 10:10 AM

## 2019-06-07 NOTE — Progress Notes (Signed)
Called Dr Alen Blew (Oncologist) regarding patient's maintenance chemotherapy for her metastatic colon cancer  He advised that she will not be at increased risk for her orthopedic surgery given that she receives chemo and also that we can hold her chemotherapy while she is an inpatient (it is not urgent as her cancer is under control despite metastasis) Dr. Alen Blew will follow up with the patient as an outpatient likely 4 weeks after discharge.  Lattie Haw  PGY-1, [place]

## 2019-06-08 ENCOUNTER — Encounter: Payer: Self-pay | Admitting: *Deleted

## 2019-06-08 DIAGNOSIS — D62 Acute posthemorrhagic anemia: Secondary | ICD-10-CM

## 2019-06-08 DIAGNOSIS — N179 Acute kidney failure, unspecified: Secondary | ICD-10-CM

## 2019-06-08 LAB — BASIC METABOLIC PANEL
Anion gap: 10 (ref 5–15)
BUN: 20 mg/dL (ref 8–23)
CO2: 24 mmol/L (ref 22–32)
Calcium: 8.5 mg/dL — ABNORMAL LOW (ref 8.9–10.3)
Chloride: 105 mmol/L (ref 98–111)
Creatinine, Ser: 1.16 mg/dL — ABNORMAL HIGH (ref 0.44–1.00)
GFR calc Af Amer: 56 mL/min — ABNORMAL LOW (ref >60)
GFR calc non Af Amer: 48 mL/min — ABNORMAL LOW (ref >60)
Glucose, Bld: 147 mg/dL — ABNORMAL HIGH (ref 70–99)
Potassium: 4.6 mmol/L (ref 3.5–5.1)
Sodium: 139 mmol/L (ref 135–145)

## 2019-06-08 LAB — CBC
HCT: 20.9 % — ABNORMAL LOW (ref 36.0–46.0)
Hemoglobin: 6.9 g/dL — CL (ref 12.0–15.0)
MCH: 33.3 pg (ref 26.0–34.0)
MCHC: 33 g/dL (ref 30.0–36.0)
MCV: 101 fL — ABNORMAL HIGH (ref 80.0–100.0)
Platelets: 133 10*3/uL — ABNORMAL LOW (ref 150–400)
RBC: 2.07 MIL/uL — ABNORMAL LOW (ref 3.87–5.11)
RDW: 16 % — ABNORMAL HIGH (ref 11.5–15.5)
WBC: 10 10*3/uL (ref 4.0–10.5)
nRBC: 0 % (ref 0.0–0.2)

## 2019-06-08 LAB — PREPARE RBC (CROSSMATCH)

## 2019-06-08 LAB — HEMOGLOBIN AND HEMATOCRIT, BLOOD
HCT: 24.3 % — ABNORMAL LOW (ref 36.0–46.0)
HCT: 25 % — ABNORMAL LOW (ref 36.0–46.0)
Hemoglobin: 7.9 g/dL — ABNORMAL LOW (ref 12.0–15.0)
Hemoglobin: 8 g/dL — ABNORMAL LOW (ref 12.0–15.0)

## 2019-06-08 MED ORDER — ENOXAPARIN SODIUM 30 MG/0.3ML ~~LOC~~ SOLN: 30.0000 mg | SUBCUTANEOUS | Status: DC

## 2019-06-08 MED ORDER — ENOXAPARIN SODIUM 40 MG/0.4ML ~~LOC~~ SOLN: 40.0000 mg | SUBCUTANEOUS | Status: DC

## 2019-06-08 MED ORDER — SODIUM CHLORIDE 0.9% IV SOLUTION: Freq: Once | INTRAVENOUS | Status: DC

## 2019-06-08 MED ORDER — ENOXAPARIN SODIUM 30 MG/0.3ML ~~LOC~~ SOLN
30.0000 mg | SUBCUTANEOUS | Status: DC
  Administered 2019-06-08: 30 mg via SUBCUTANEOUS
  Filled 2019-06-08: qty 0.3

## 2019-06-08 MED ORDER — CALCIUM CARBONATE-VITAMIN D 500-200 MG-UNIT PO TABS
1.0000 | ORAL_TABLET | Freq: Every day | ORAL | Status: DC
  Administered 2019-06-09 – 2019-06-10 (×2): 1 via ORAL
  Filled 2019-06-08 (×2): qty 1
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
## 2019-06-08 NOTE — Progress Notes (Signed)
Critical HgB 6.9 (7.9). Paged on-call [facility] provider. Awaiting response at this time. VSS at this time. Will continue to monitor patient and status.   Page found below:  "5N05 Hiraldo- pt HgB critical 6.9. Prior HgB 7.9. Lovenox scheduled this AM. Should I give or hold? Please advise. Thank you."

## 2019-06-08 NOTE — Progress Notes (Signed)
FPTS Interim Progress Note  Spoke with PA Orion Crook regarding long-term VTE PPx for this patient given history of metastatic colon cancer.  He agreed that Apixaban low dose (2.5mg ) would be appropriate from Ortho standpoint if our team agreed.  Will plan for VTE PPx long-term with Apixaban 2.5mg  and patient will need Bisphosphonate 6-8 weeks post-fracture.  [facility], DO 06/08/2019, 12:07 PM PGY-2, [place] Medicine Service pager [phone]

## 2019-06-08 NOTE — Plan of Care (Signed)

## 2019-06-08 NOTE — Progress Notes (Signed)
PT Cancellation Note  Patient Details Name: MASHIYA SICAIROS MRN: [id number] DOB: [birth year]   Cancelled Treatment:    Reason Eval/Treat Not Completed: Patient not medically ready(Hgb 6.9)   Jaeveon Ashland B Izek Corvino 06/08/2019, 6:44 AM  Bayard Males, PT Acute Rehabilitation Services Pager: [phone] Office: [phone]

## 2019-06-09 ENCOUNTER — Inpatient Hospital Stay: Payer: Medicare Other

## 2019-06-09 LAB — BASIC METABOLIC PANEL
Anion gap: 8 (ref 5–15)
BUN: 19 mg/dL (ref 8–23)
CO2: 24 mmol/L (ref 22–32)
Calcium: 8.3 mg/dL — ABNORMAL LOW (ref 8.9–10.3)
Chloride: 107 mmol/L (ref 98–111)
Creatinine, Ser: 1.18 mg/dL — ABNORMAL HIGH (ref 0.44–1.00)
GFR calc Af Amer: 54 mL/min — ABNORMAL LOW (ref >60)
GFR calc non Af Amer: 47 mL/min — ABNORMAL LOW (ref >60)
Glucose, Bld: 88 mg/dL (ref 70–99)
Potassium: 3.8 mmol/L (ref 3.5–5.1)
Sodium: 139 mmol/L (ref 135–145)

## 2019-06-09 LAB — PREPARE RBC (CROSSMATCH)

## 2019-06-09 LAB — CBC
HCT: 19.8 % — ABNORMAL LOW (ref 36.0–46.0)
Hemoglobin: 6.6 g/dL — CL (ref 12.0–15.0)
MCH: 32.2 pg (ref 26.0–34.0)
MCHC: 33.3 g/dL (ref 30.0–36.0)
MCV: 96.6 fL (ref 80.0–100.0)
Platelets: 124 10*3/uL — ABNORMAL LOW (ref 150–400)
RBC: 2.05 MIL/uL — ABNORMAL LOW (ref 3.87–5.11)
RDW: 17.7 % — ABNORMAL HIGH (ref 11.5–15.5)
WBC: 7.5 10*3/uL (ref 4.0–10.5)
nRBC: 0 % (ref 0.0–0.2)

## 2019-06-09 LAB — HEMOGLOBIN AND HEMATOCRIT, BLOOD
HCT: 26.2 % — ABNORMAL LOW (ref 36.0–46.0)
Hemoglobin: 9 g/dL — ABNORMAL LOW (ref 12.0–15.0)

## 2019-06-09 MED ORDER — APIXABAN 2.5 MG PO TABS
2.5000 mg | ORAL_TABLET | Freq: Two times a day (BID) | ORAL | Status: DC
  Administered 2019-06-10: 2.5 mg via ORAL
  Filled 2019-06-09: qty 1

## 2019-06-09 MED ORDER — HYDROCODONE-ACETAMINOPHEN 7.5-325 MG PO TABS: 1.0000 | ORAL_TABLET | ORAL | 0 refills | Status: DC | PRN

## 2019-06-09 MED ORDER — SODIUM CHLORIDE 0.9% IV SOLUTION: Freq: Once | INTRAVENOUS | Status: DC

## 2019-06-09 MED ORDER — HYDROCODONE-ACETAMINOPHEN 7.5-325 MG PO TABS
1.0000 | ORAL_TABLET | Freq: Four times a day (QID) | ORAL | Status: DC | PRN
  Administered 2019-06-09 – 2019-06-10 (×4): 1 via ORAL
  Filled 2019-06-09 (×4): qty 1

## 2019-06-09 MED ORDER — APIXABAN 2.5 MG PO TABS: 2.5000 mg | ORAL_TABLET | Freq: Two times a day (BID) | ORAL | 0 refills | Status: DC

## 2019-06-09 MED ORDER — ASPIRIN 81 MG PO CHEW: 81.0000 mg | CHEWABLE_TABLET | Freq: Two times a day (BID) | ORAL | Status: DC

## 2019-06-09 MED ORDER — OXYCODONE HCL 5 MG PO TABS: 5.0000 mg | ORAL_TABLET | ORAL | Status: DC | PRN

## 2019-06-09 NOTE — Care Management (Signed)
Per Nolene Bernheim  [phone] Co- pay for Eliquis 2.5 mg. 30 day supply $ 241.51 retail pharmacy. Mail order  For a 90 day supply $222.23. After deductible has been met co-pay Zero. Apixaban not found..  No PA required Deductible not met. $435.00 ( only met $27.38 ) Tier 3 medication Retail Pharmacy: [facility], [facility]. Mail order:  [employer]

## 2019-06-09 NOTE — Progress Notes (Signed)
Paged family medicine intern in regards to patient's critically low HgB of 6.6  Will continue to monitor patient.

## 2019-06-09 NOTE — Plan of Care (Signed)
  Problem: Activity: Goal: Risk for activity intolerance will decrease Outcome: Progressing   Problem: Pain Managment: Goal: General experience of comfort will improve Outcome: Progressing   Problem: Skin Integrity: Goal: Risk for impaired skin integrity will decrease Outcome: Progressing   Problem: Education: Goal: Verbalization of understanding the information provided (i.e., activity precautions, restrictions, etc) will improve Outcome: Progressing   Problem: Activity: Goal: Ability to ambulate and perform ADLs will improve Outcome: Progressing   Problem: Clinical Measurements: Goal: Postoperative complications will be avoided or minimized Outcome: Progressing   Problem: Self-Concept: Goal: Ability to maintain and perform role responsibilities to the fullest extent possible will improve Outcome: Progressing   Problem: Pain Management: Goal: Pain level will decrease Outcome: Progressing
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
## 2019-06-09 NOTE — Progress Notes (Addendum)
    Subjective:  Patient reports pain as mild to moderate.  Denies N/V/CP/SOB. No c/o.  Objective:   VITALS:   Vitals:   06/09/19 0839 06/09/19 0930 06/09/19 1011 06/09/19 1045  BP: 113/77 130/71 115/70 126/63  Pulse: (!) 111 (!) 102 96 91  Resp: 17 18 16 16   Temp: 98.3 F (36.8 C)  97.8 F (36.6 C) 97.9 F (36.6 C)  TempSrc: Oral  Oral Oral  SpO2: 100% 99% 98% 97%  Weight:      Height:        NAD ABD soft Sensation intact distally Intact pulses distally Dorsiflexion/Plantar flexion intact Incision: dressing C/D/I Compartment soft  No hematoma   Lab Results  Component Value Date   WBC 7.5 06/09/2019   HGB 6.6 (LL) 06/09/2019   HCT 19.8 (L) 06/09/2019   MCV 96.6 06/09/2019   PLT 124 (L) 06/09/2019   BMET    Component Value Date/Time   NA 139 06/09/2019 0422   NA 141 05/26/2017 0809   K 3.8 06/09/2019 0422   K 4.0 05/26/2017 0809   CL 107 06/09/2019 0422   CO2 24 06/09/2019 0422   CO2 23 05/26/2017 0809   GLUCOSE 88 06/09/2019 0422   GLUCOSE 95 05/26/2017 0809   BUN 19 06/09/2019 0422   BUN 14.5 05/26/2017 0809   CREATININE 1.18 (H) 06/09/2019 0422   CREATININE 0.99 05/24/2019 1006   CREATININE 1.0 05/26/2017 0809   CALCIUM 8.3 (L) 06/09/2019 0422   CALCIUM 9.4 05/26/2017 0809   GFRNONAA 47 (L) 06/09/2019 0422   GFRNONAA 58 (L) 05/24/2019 1006   GFRNONAA 57 (L) 02/12/2015 0706   GFRAA 54 (L) 06/09/2019 0422   GFRAA >60 05/24/2019 1006   GFRAA 65 02/12/2015 0706     Assessment/Plan: 2 Days Post-Op   Active Problems:   Other fracture of right femur, initial encounter for closed fracture (E. Lopez)   WBAT with walker DVT ppx: apixaban 2.5 mg po bid (due to h/o metastatic colon ca), SCDs, TEDS Receiving PRBCs for ABLA PO pain control PT/OT Dispo: D/C planning   Hilton Cork Maniah Nading 06/09/2019, 12:21 PM   Rod Can, MD [phone] [place] is now [facility]  [facility] [address], [address], [place], [place]  [address] Phone: [phone] [email or web address] Facebook  [employer]
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
## 2019-06-09 NOTE — Discharge Instructions (Signed)
 Dr. Brian Swinteck Adult Hip & Knee Specialist [facility] [address] [place], [place] [address] [phone]   POSTOPERATIVE DIRECTIONS    Hip Rehabilitation, Guidelines Following Surgery   WEIGHT BEARING Weight bearing as tolerated with assist device (walker, cane, etc) as directed, use it as long as suggested by your surgeon or therapist, typically at least 4-6 weeks.   HOME CARE INSTRUCTIONS  Remove items at home which could result in a fall. This includes throw rugs or furniture in walking pathways.  Continue medications as instructed at time of discharge.  You may have some home medications which will be placed on hold until you complete the course of blood thinner medication.  4 days after discharge, you may start showering. No tub baths or soaking your incisions. Do not put on socks or shoes without following the instructions of your caregivers.   Sit on chairs with arms. Use the chair arms to help push yourself up when arising.  Arrange for the use of a toilet seat elevator so you are not sitting low.   Walk with walker as instructed.  You may resume a sexual relationship in one month or when given the OK by your caregiver.  Use walker as long as suggested by your caregivers.  Avoid periods of inactivity such as sitting longer than an hour when not asleep. This helps prevent blood clots.  You may return to work once you are cleared by your surgeon.  Do not drive a car for 6 weeks or until released by your surgeon.  Do not drive while taking narcotics.  Wear elastic stockings for two weeks following surgery during the day but you may remove then at night.  Make sure you keep all of your appointments after your operation with all of your doctors and caregivers. You should call the office at the above phone number and make an appointment for approximately two weeks after the date of your surgery. Please pick up a stool softener and laxative  for home use as long as you are requiring pain medications.  ICE to the affected hip every three hours for 30 minutes at a time and then as needed for pain and swelling. Continue to use ice on the hip for pain and swelling from surgery. You may notice swelling that will progress down to the foot and ankle.  This is normal after surgery.  Elevate the leg when you are not up walking on it.   It is important for you to complete the blood thinner medication as prescribed by your doctor.  Continue to use the breathing machine which will help keep your temperature down.  It is common for your temperature to cycle up and down following surgery, especially at night when you are not up moving around and exerting yourself.  The breathing machine keeps your lungs expanded and your temperature down.  RANGE OF MOTION AND STRENGTHENING EXERCISES  These exercises are designed to help you keep full movement of your hip joint. Follow your caregiver's or physical therapist's instructions. Perform all exercises about fifteen times, three times per day or as directed. Exercise both hips, even if you have had only one joint replacement. These exercises can be done on a training (exercise) mat, on the floor, on a table or on a bed. Use whatever works the best and is most comfortable for you. Use music or television while you are exercising so that the exercises are a pleasant break in your day. This   will make your life better with the exercises acting as a break in routine you can look forward to.  Lying on your back, slowly slide your foot toward your buttocks, raising your knee up off the floor. Then slowly slide your foot back down until your leg is straight again.  Lying on your back spread your legs as far apart as you can without causing discomfort.  Lying on your side, raise your upper leg and foot straight up from the floor as far as is comfortable. Slowly lower the leg and repeat.  Lying on your back, tighten up the  muscle in the front of your thigh (quadriceps muscles). You can do this by keeping your leg straight and trying to raise your heel off the floor. This helps strengthen the largest muscle supporting your knee.  Lying on your back, tighten up the muscles of your buttocks both with the legs straight and with the knee bent at a comfortable angle while keeping your heel on the floor.   SKILLED REHAB INSTRUCTIONS: If the patient is transferred to a skilled rehab facility following release from the hospital, a list of the current medications will be sent to the facility for the patient to continue.  When discharged from the skilled rehab facility, please have the facility set up the patient's [place] prior to being released. Also, the skilled facility will be responsible for providing the patient with their medications at time of release from the facility to include their pain medication and their blood thinner medication. If the patient is still at the rehab facility at time of the two week follow up appointment, the skilled rehab facility will also need to assist the patient in arranging follow up appointment in our office and any transportation needs.  MAKE SURE YOU:  Understand these instructions.  Will watch your condition.  Will get help right away if you are not doing well or get worse.  Pick up stool softner and laxative for home use following surgery while on pain medications. Daily dry dressing changes as needed. In 4 days, you may remove your dressings and begin taking showers - no tub baths or soaking the incisions. Continue to use ice for pain and swelling after surgery. Do not use any lotions or creams on the incision until instructed by your surgeon.  ---------------------------------------------------------------------------------------------------------------------------------------------------------------------------------------------------------- Information on my  medicine - ELIQUIS (apixaban)  This medication education was reviewed with me or my healthcare representative as part of my discharge preparation.  The pharmacist that spoke with me during my hospital stay was:  Donnamae Jude, [facility]  Why was Eliquis prescribed for you? Eliquis was prescribed for you to reduce the risk of blood clots forming after orthopedic surgery.    What do You need to know about Eliquis? Take your Eliquis TWICE DAILY - one tablet in the morning and one tablet in the evening with or without food.  It would be best to take the dose about the same time each day.  If you have difficulty swallowing the tablet whole please discuss with your pharmacist how to take the medication safely.  Take Eliquis exactly as prescribed by your doctor and DO NOT stop taking Eliquis without talking to the doctor who prescribed the medication.  Stopping without other medication to take the place of Eliquis may increase your risk of developing a clot.  After discharge, you should have regular check-up appointments with your healthcare provider that is prescribing your Eliquis.  What do  you do if you miss a dose? If a dose of ELIQUIS is not taken at the scheduled time, take it as soon as possible on the same day and twice-daily administration should be resumed.  The dose should not be doubled to make up for a missed dose.  Do not take more than one tablet of ELIQUIS at the same time.  Important Safety Information A possible side effect of Eliquis is bleeding. You should call your healthcare provider right away if you experience any of the following: ? Bleeding from an injury or your nose that does not stop. ? Unusual colored urine (red or dark brown) or unusual colored stools (red or black). ? Unusual bruising for unknown reasons. ? A serious fall or if you hit your head (even if there is no bleeding).  Some medicines may interact with Eliquis and might increase your risk of bleeding or  clotting while on Eliquis. To help avoid this, consult your healthcare provider or pharmacist prior to using any new prescription or non-prescription medications, including herbals, vitamins, non-steroidal anti-inflammatory drugs (NSAIDs) and supplements.  This website has more information on Eliquis (apixaban): http://www.eliquis.com/eliquis/home
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
## 2019-06-09 NOTE — Plan of Care (Signed)
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
## 2019-06-10 DIAGNOSIS — C19 Malignant neoplasm of rectosigmoid junction: Secondary | ICD-10-CM

## 2019-06-10 LAB — BPAM RBC
Blood Product Expiration Date: 202012242359
Blood Product Expiration Date: 202101162359
ISSUE DATE / TIME: 202012171025
ISSUE DATE / TIME: 202012181014
Unit Type and Rh: 5100
Unit Type and Rh: 5100

## 2019-06-10 LAB — CBC
HCT: 25 % — ABNORMAL LOW (ref 36.0–46.0)
Hemoglobin: 8.3 g/dL — ABNORMAL LOW (ref 12.0–15.0)
MCH: 32.3 pg (ref 26.0–34.0)
MCHC: 33.2 g/dL (ref 30.0–36.0)
MCV: 97.3 fL (ref 80.0–100.0)
Platelets: 152 10*3/uL (ref 150–400)
RBC: 2.57 MIL/uL — ABNORMAL LOW (ref 3.87–5.11)
RDW: 17.3 % — ABNORMAL HIGH (ref 11.5–15.5)
WBC: 6.9 10*3/uL (ref 4.0–10.5)
nRBC: 0.4 % — ABNORMAL HIGH (ref 0.0–0.2)

## 2019-06-10 LAB — TYPE AND SCREEN
ABO/RH(D): O POS
Antibody Screen: NEGATIVE
Unit division: 0
Unit division: 0

## 2019-06-10 MED ORDER — CALCIUM CARBONATE-VITAMIN D 500-200 MG-UNIT PO TABS: 1.0000 | ORAL_TABLET | Freq: Every day | ORAL | 0 refills | Status: DC
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
## 2019-06-10 NOTE — Plan of Care (Signed)
  Problem: Education: Goal: Knowledge of General Education information will improve Description: Including pain rating scale, medication(s)/side effects and non-pharmacologic comfort measures Outcome: Progressing   Problem: Health Behavior/Discharge Planning: Goal: Ability to manage health-related needs will improve Outcome: Progressing   Problem: Pain Managment: Goal: General experience of comfort will improve Outcome: Progressing   Problem: Safety: Goal: Ability to remain free from injury will improve Outcome: Progressing   Problem: Skin Integrity: Goal: Risk for impaired skin integrity will decrease Outcome: Progressing   Problem: Pain Management: Goal: Pain level will decrease Outcome: Progressing
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
## 2019-06-10 NOTE — Plan of Care (Signed)

## 2019-06-13 DIAGNOSIS — S72141D Displaced intertrochanteric fracture of right femur, subsequent encounter for closed fracture with routine healing: Secondary | ICD-10-CM | POA: Diagnosis not present

## 2019-06-13 DIAGNOSIS — Z9181 History of falling: Secondary | ICD-10-CM | POA: Diagnosis not present

## 2019-06-13 DIAGNOSIS — N1831 Chronic kidney disease, stage 3a: Secondary | ICD-10-CM | POA: Diagnosis not present

## 2019-06-13 DIAGNOSIS — C189 Malignant neoplasm of colon, unspecified: Secondary | ICD-10-CM | POA: Diagnosis not present

## 2019-06-13 DIAGNOSIS — D631 Anemia in chronic kidney disease: Secondary | ICD-10-CM | POA: Diagnosis not present

## 2019-06-13 DIAGNOSIS — Z7901 Long term (current) use of anticoagulants: Secondary | ICD-10-CM | POA: Diagnosis not present

## 2019-06-13 DIAGNOSIS — D630 Anemia in neoplastic disease: Secondary | ICD-10-CM | POA: Diagnosis not present

## 2019-06-13 DIAGNOSIS — Z79891 Long term (current) use of opiate analgesic: Secondary | ICD-10-CM | POA: Diagnosis not present

## 2019-06-14 ENCOUNTER — Encounter: Payer: Self-pay | Admitting: Family Medicine

## 2019-06-14 ENCOUNTER — Other Ambulatory Visit: Payer: Self-pay

## 2019-06-14 ENCOUNTER — Ambulatory Visit (INDEPENDENT_AMBULATORY_CARE_PROVIDER_SITE_OTHER): Payer: Medicare Other | Admitting: Family Medicine

## 2019-06-14 VITALS — BP 152/80 | HR 107

## 2019-06-14 DIAGNOSIS — S728X1A Other fracture of right femur, initial encounter for closed fracture: Secondary | ICD-10-CM

## 2019-06-14 DIAGNOSIS — C189 Malignant neoplasm of colon, unspecified: Secondary | ICD-10-CM

## 2019-06-14 DIAGNOSIS — D649 Anemia, unspecified: Secondary | ICD-10-CM

## 2019-06-14 DIAGNOSIS — C787 Secondary malignant neoplasm of liver and intrahepatic bile duct: Secondary | ICD-10-CM

## 2019-06-14 DIAGNOSIS — D500 Iron deficiency anemia secondary to blood loss (chronic): Secondary | ICD-10-CM | POA: Diagnosis not present
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
## 2019-06-15 DIAGNOSIS — D630 Anemia in neoplastic disease: Secondary | ICD-10-CM | POA: Diagnosis not present

## 2019-06-15 DIAGNOSIS — D631 Anemia in chronic kidney disease: Secondary | ICD-10-CM | POA: Diagnosis not present

## 2019-06-15 DIAGNOSIS — Z79891 Long term (current) use of opiate analgesic: Secondary | ICD-10-CM | POA: Diagnosis not present

## 2019-06-15 DIAGNOSIS — S72141D Displaced intertrochanteric fracture of right femur, subsequent encounter for closed fracture with routine healing: Secondary | ICD-10-CM | POA: Diagnosis not present

## 2019-06-15 DIAGNOSIS — N1831 Chronic kidney disease, stage 3a: Secondary | ICD-10-CM | POA: Diagnosis not present

## 2019-06-15 DIAGNOSIS — C189 Malignant neoplasm of colon, unspecified: Secondary | ICD-10-CM | POA: Diagnosis not present

## 2019-06-15 LAB — CBC WITH DIFFERENTIAL/PLATELET
Basophils Absolute: 0 10*3/uL (ref 0.0–0.2)
Basos: 0 %
EOS (ABSOLUTE): 0 10*3/uL (ref 0.0–0.4)
Eos: 1 %
Hematocrit: 29.4 % — ABNORMAL LOW (ref 34.0–46.6)
Hemoglobin: 9.7 g/dL — ABNORMAL LOW (ref 11.1–15.9)
Immature Grans (Abs): 0 10*3/uL (ref 0.0–0.1)
Immature Granulocytes: 1 %
Lymphocytes Absolute: 1.2 10*3/uL (ref 0.7–3.1)
Lymphs: 21 %
MCH: 32.3 pg (ref 26.6–33.0)
MCHC: 33 g/dL (ref 31.5–35.7)
MCV: 98 fL — ABNORMAL HIGH (ref 79–97)
Monocytes Absolute: 0.8 10*3/uL (ref 0.1–0.9)
Monocytes: 15 %
Neutrophils Absolute: 3.3 10*3/uL (ref 1.4–7.0)
Neutrophils: 62 %
Platelets: 298 10*3/uL (ref 150–450)
RBC: 3 x10E6/uL — ABNORMAL LOW (ref 3.77–5.28)
RDW: 15.7 % — ABNORMAL HIGH (ref 11.7–15.4)
WBC: 5.4 10*3/uL (ref 3.4–10.8)

## 2019-06-15 LAB — IRON,TIBC AND FERRITIN PANEL
Ferritin: 226 ng/mL — ABNORMAL HIGH (ref 15–150)
Iron Saturation: 19 % (ref 15–55)
Iron: 65 ug/dL (ref 27–139)
Total Iron Binding Capacity: 335 ug/dL (ref 250–450)
UIBC: 270 ug/dL (ref 118–369)

## 2019-06-19 DIAGNOSIS — S72141D Displaced intertrochanteric fracture of right femur, subsequent encounter for closed fracture with routine healing: Secondary | ICD-10-CM | POA: Diagnosis not present

## 2019-06-19 DIAGNOSIS — C189 Malignant neoplasm of colon, unspecified: Secondary | ICD-10-CM | POA: Diagnosis not present

## 2019-06-19 DIAGNOSIS — Z79891 Long term (current) use of opiate analgesic: Secondary | ICD-10-CM | POA: Diagnosis not present

## 2019-06-19 DIAGNOSIS — N1831 Chronic kidney disease, stage 3a: Secondary | ICD-10-CM | POA: Diagnosis not present

## 2019-06-19 DIAGNOSIS — D630 Anemia in neoplastic disease: Secondary | ICD-10-CM | POA: Diagnosis not present

## 2019-06-19 DIAGNOSIS — D631 Anemia in chronic kidney disease: Secondary | ICD-10-CM | POA: Diagnosis not present

## 2019-06-20 ENCOUNTER — Other Ambulatory Visit: Payer: Self-pay

## 2019-06-20 ENCOUNTER — Other Ambulatory Visit: Payer: Self-pay | Admitting: Family Medicine

## 2019-06-20 DIAGNOSIS — S72141D Displaced intertrochanteric fracture of right femur, subsequent encounter for closed fracture with routine healing: Secondary | ICD-10-CM | POA: Diagnosis not present

## 2019-06-20 NOTE — Assessment & Plan Note (Signed)
Doing well from this standpoint. Has not made appointment with ortho yet. Gave office number and expressed importance of making sure she has proper follow up.
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
## 2019-06-20 NOTE — Telephone Encounter (Signed)
Pt left message on nurse line, requesting refill of hydrocodone. Pt saw Dr. Kris Mouton 06/14/19, had refill from ortho doctor on 06/09/19 and is now out of pain medication.  To PCP, please advise. Talbot Grumbling, RN

## 2019-06-20 NOTE — Assessment & Plan Note (Signed)
Missed chemotherapy transfusion in the hospital. She has appointment with oncology in early January. Expressed importance of keeping this follow up.

## 2019-06-20 NOTE — Progress Notes (Signed)
She will need to contact her orthopedic office. I gave her the number at my visit with her. They performed the surgery and will likely want to know if she is still in significant pain. Furthermore she is not a patient at this practice at this time. I saw her for a hospital check up visit to check her blood counts, not for post-operative management. She is supposed to be thinking about who she wants as a PCP. It will not be this practice based on my conversation with her.  Please let her know her blood counts looked great and have improved significantly from the hospital.  Guadalupe Dawn MD PGY-3 Family Medicine Resident

## 2019-06-21 DIAGNOSIS — C189 Malignant neoplasm of colon, unspecified: Secondary | ICD-10-CM | POA: Diagnosis not present

## 2019-06-21 DIAGNOSIS — Z79891 Long term (current) use of opiate analgesic: Secondary | ICD-10-CM | POA: Diagnosis not present

## 2019-06-21 DIAGNOSIS — D630 Anemia in neoplastic disease: Secondary | ICD-10-CM | POA: Diagnosis not present

## 2019-06-21 DIAGNOSIS — S72141D Displaced intertrochanteric fracture of right femur, subsequent encounter for closed fracture with routine healing: Secondary | ICD-10-CM | POA: Diagnosis not present

## 2019-06-21 DIAGNOSIS — D631 Anemia in chronic kidney disease: Secondary | ICD-10-CM | POA: Diagnosis not present

## 2019-06-21 DIAGNOSIS — N1831 Chronic kidney disease, stage 3a: Secondary | ICD-10-CM | POA: Diagnosis not present

## 2019-06-26 ENCOUNTER — Telehealth: Payer: Self-pay | Admitting: Oncology

## 2019-06-26 DIAGNOSIS — Z79891 Long term (current) use of opiate analgesic: Secondary | ICD-10-CM | POA: Diagnosis not present

## 2019-06-26 DIAGNOSIS — D630 Anemia in neoplastic disease: Secondary | ICD-10-CM | POA: Diagnosis not present

## 2019-06-26 DIAGNOSIS — D631 Anemia in chronic kidney disease: Secondary | ICD-10-CM | POA: Diagnosis not present

## 2019-06-26 DIAGNOSIS — C189 Malignant neoplasm of colon, unspecified: Secondary | ICD-10-CM | POA: Diagnosis not present

## 2019-06-26 DIAGNOSIS — N1831 Chronic kidney disease, stage 3a: Secondary | ICD-10-CM | POA: Diagnosis not present

## 2019-06-26 DIAGNOSIS — S72141D Displaced intertrochanteric fracture of right femur, subsequent encounter for closed fracture with routine healing: Secondary | ICD-10-CM | POA: Diagnosis not present

## 2019-06-26 NOTE — Telephone Encounter (Signed)
Returned patient's phone call regarding rescheduling 01/06 appointments, left a voicemail.

## 2019-06-28 ENCOUNTER — Inpatient Hospital Stay: Payer: Medicare Other

## 2019-06-28 ENCOUNTER — Telehealth: Payer: Self-pay

## 2019-06-28 ENCOUNTER — Inpatient Hospital Stay: Payer: Medicare Other | Admitting: Oncology

## 2019-06-28 DIAGNOSIS — Z79891 Long term (current) use of opiate analgesic: Secondary | ICD-10-CM | POA: Diagnosis not present

## 2019-06-28 DIAGNOSIS — D631 Anemia in chronic kidney disease: Secondary | ICD-10-CM | POA: Diagnosis not present

## 2019-06-28 DIAGNOSIS — N1831 Chronic kidney disease, stage 3a: Secondary | ICD-10-CM | POA: Diagnosis not present

## 2019-06-28 DIAGNOSIS — S72141D Displaced intertrochanteric fracture of right femur, subsequent encounter for closed fracture with routine healing: Secondary | ICD-10-CM | POA: Diagnosis not present

## 2019-06-28 DIAGNOSIS — C189 Malignant neoplasm of colon, unspecified: Secondary | ICD-10-CM | POA: Diagnosis not present

## 2019-06-28 DIAGNOSIS — D630 Anemia in neoplastic disease: Secondary | ICD-10-CM | POA: Diagnosis not present

## 2019-06-28 NOTE — Telephone Encounter (Signed)
Called and left message with patient in regards to her appointments scheduled for today.  Per the message on 06/26/19 from [facility], patient had left a message wanting to reschedule appointments.  Patient asked to call scheduling department  At [phone] to reschedule today's appointments.

## 2019-06-29 ENCOUNTER — Telehealth: Payer: Self-pay | Admitting: *Deleted

## 2019-06-29 ENCOUNTER — Telehealth: Payer: Self-pay | Admitting: Oncology

## 2019-06-29 NOTE — Telephone Encounter (Signed)
Dwyane Dee from [facility] calling for PT verbal orders as follows:  2 time(s) weekly for 4 week(s), then 1 time(s) weekly for 1 week(s), then 2 time(s) weekly for 3 week(s)   You can leave verbal orders on confidential voicemail.  Christen Bame, CMA

## 2019-06-29 NOTE — Telephone Encounter (Signed)
R/s appt per 1/6 sch message - unable to reach pt . Left message with appt date and time

## 2019-06-30 ENCOUNTER — Telehealth: Payer: Self-pay
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
## 2019-07-03 DIAGNOSIS — S72141D Displaced intertrochanteric fracture of right femur, subsequent encounter for closed fracture with routine healing: Secondary | ICD-10-CM | POA: Diagnosis not present

## 2019-07-03 DIAGNOSIS — Z79891 Long term (current) use of opiate analgesic: Secondary | ICD-10-CM | POA: Diagnosis not present

## 2019-07-03 DIAGNOSIS — D630 Anemia in neoplastic disease: Secondary | ICD-10-CM | POA: Diagnosis not present

## 2019-07-03 DIAGNOSIS — C189 Malignant neoplasm of colon, unspecified: Secondary | ICD-10-CM | POA: Diagnosis not present

## 2019-07-03 DIAGNOSIS — N1831 Chronic kidney disease, stage 3a: Secondary | ICD-10-CM | POA: Diagnosis not present

## 2019-07-03 DIAGNOSIS — D631 Anemia in chronic kidney disease: Secondary | ICD-10-CM | POA: Diagnosis not present

## 2019-07-04 ENCOUNTER — Telehealth: Payer: Self-pay | Admitting: Oncology

## 2019-07-04 NOTE — Telephone Encounter (Signed)
Scheduled appt per 1/11 sch message pt aware of appt on 1/20

## 2019-07-05 ENCOUNTER — Other Ambulatory Visit: Payer: Medicare Other

## 2019-07-05 ENCOUNTER — Ambulatory Visit: Payer: Medicare Other

## 2019-07-05 ENCOUNTER — Ambulatory Visit: Payer: Medicare Other | Admitting: Oncology

## 2019-07-05 DIAGNOSIS — D630 Anemia in neoplastic disease: Secondary | ICD-10-CM | POA: Diagnosis not present

## 2019-07-05 DIAGNOSIS — C189 Malignant neoplasm of colon, unspecified: Secondary | ICD-10-CM | POA: Diagnosis not present

## 2019-07-05 DIAGNOSIS — N1831 Chronic kidney disease, stage 3a: Secondary | ICD-10-CM | POA: Diagnosis not present

## 2019-07-05 DIAGNOSIS — D631 Anemia in chronic kidney disease: Secondary | ICD-10-CM | POA: Diagnosis not present

## 2019-07-05 DIAGNOSIS — Z79891 Long term (current) use of opiate analgesic: Secondary | ICD-10-CM | POA: Diagnosis not present

## 2019-07-05 DIAGNOSIS — S72141D Displaced intertrochanteric fracture of right femur, subsequent encounter for closed fracture with routine healing: Secondary | ICD-10-CM | POA: Diagnosis not present

## 2019-07-10 DIAGNOSIS — S72141D Displaced intertrochanteric fracture of right femur, subsequent encounter for closed fracture with routine healing: Secondary | ICD-10-CM | POA: Diagnosis not present

## 2019-07-10 DIAGNOSIS — C189 Malignant neoplasm of colon, unspecified: Secondary | ICD-10-CM | POA: Diagnosis not present

## 2019-07-10 DIAGNOSIS — D630 Anemia in neoplastic disease: Secondary | ICD-10-CM | POA: Diagnosis not present

## 2019-07-10 DIAGNOSIS — N1831 Chronic kidney disease, stage 3a: Secondary | ICD-10-CM | POA: Diagnosis not present

## 2019-07-10 DIAGNOSIS — Z79891 Long term (current) use of opiate analgesic: Secondary | ICD-10-CM | POA: Diagnosis not present

## 2019-07-10 DIAGNOSIS — D631 Anemia in chronic kidney disease: Secondary | ICD-10-CM | POA: Diagnosis not present

## 2019-07-12 ENCOUNTER — Inpatient Hospital Stay: Payer: Medicare Other

## 2019-07-12 ENCOUNTER — Inpatient Hospital Stay: Payer: Medicare Other | Attending: Oncology

## 2019-07-12 ENCOUNTER — Inpatient Hospital Stay (HOSPITAL_BASED_OUTPATIENT_CLINIC_OR_DEPARTMENT_OTHER): Payer: Medicare Other | Admitting: Oncology

## 2019-07-12 ENCOUNTER — Other Ambulatory Visit: Payer: Self-pay

## 2019-07-12 ENCOUNTER — Telehealth: Payer: Self-pay | Admitting: Oncology

## 2019-07-12 VITALS — BP 148/83 | HR 84 | Temp 97.3°F | Resp 18 | Wt 140.7 lb

## 2019-07-12 DIAGNOSIS — Z79899 Other long term (current) drug therapy: Secondary | ICD-10-CM | POA: Insufficient documentation

## 2019-07-12 DIAGNOSIS — C787 Secondary malignant neoplasm of liver and intrahepatic bile duct: Secondary | ICD-10-CM | POA: Diagnosis not present

## 2019-07-12 DIAGNOSIS — M25551 Pain in right hip: Secondary | ICD-10-CM | POA: Diagnosis not present

## 2019-07-12 DIAGNOSIS — C786 Secondary malignant neoplasm of retroperitoneum and peritoneum: Secondary | ICD-10-CM | POA: Diagnosis not present

## 2019-07-12 DIAGNOSIS — K769 Liver disease, unspecified: Secondary | ICD-10-CM

## 2019-07-12 DIAGNOSIS — Z5111 Encounter for antineoplastic chemotherapy: Secondary | ICD-10-CM | POA: Diagnosis not present

## 2019-07-12 DIAGNOSIS — C189 Malignant neoplasm of colon, unspecified: Secondary | ICD-10-CM | POA: Insufficient documentation

## 2019-07-12 DIAGNOSIS — K6389 Other specified diseases of intestine: Secondary | ICD-10-CM

## 2019-07-12 DIAGNOSIS — Z5112 Encounter for antineoplastic immunotherapy: Secondary | ICD-10-CM | POA: Insufficient documentation

## 2019-07-12 DIAGNOSIS — Z9049 Acquired absence of other specified parts of digestive tract: Secondary | ICD-10-CM | POA: Diagnosis not present

## 2019-07-12 LAB — CMP (CANCER CENTER ONLY)
ALT: 9 U/L (ref 0–44)
AST: 16 U/L (ref 15–41)
Albumin: 3.5 g/dL (ref 3.5–5.0)
Alkaline Phosphatase: 115 U/L (ref 38–126)
Anion gap: 6 (ref 5–15)
BUN: 21 mg/dL (ref 8–23)
CO2: 26 mmol/L (ref 22–32)
Calcium: 9.1 mg/dL (ref 8.9–10.3)
Chloride: 106 mmol/L (ref 98–111)
Creatinine: 0.96 mg/dL (ref 0.44–1.00)
GFR, Est AFR Am: >60 mL/min (ref >60)
GFR, Estimated: >60 mL/min (ref >60)
Glucose, Bld: 85 mg/dL (ref 70–99)
Potassium: 4.4 mmol/L (ref 3.5–5.1)
Sodium: 138 mmol/L (ref 135–145)
Total Bilirubin: 0.5 mg/dL (ref 0.3–1.2)
Total Protein: 7.5 g/dL (ref 6.5–8.1)

## 2019-07-12 LAB — CBC WITH DIFFERENTIAL (CANCER CENTER ONLY)
Abs Immature Granulocytes: 0.01 10*3/uL (ref 0.00–0.07)
Basophils Absolute: 0 10*3/uL (ref 0.0–0.1)
Basophils Relative: 1 %
Eosinophils Absolute: 0.1 10*3/uL (ref 0.0–0.5)
Eosinophils Relative: 1 %
HCT: 36.2 % (ref 36.0–46.0)
Hemoglobin: 11.4 g/dL — ABNORMAL LOW (ref 12.0–15.0)
Immature Granulocytes: 0 %
Lymphocytes Relative: 33 %
Lymphs Abs: 1.6 10*3/uL (ref 0.7–4.0)
MCH: 32 pg (ref 26.0–34.0)
MCHC: 31.5 g/dL (ref 30.0–36.0)
MCV: 101.7 fL — ABNORMAL HIGH (ref 80.0–100.0)
Monocytes Absolute: 0.4 10*3/uL (ref 0.1–1.0)
Monocytes Relative: 9 %
Neutro Abs: 2.7 10*3/uL (ref 1.7–7.7)
Neutrophils Relative %: 56 %
Platelet Count: 246 10*3/uL (ref 150–400)
RBC: 3.56 MIL/uL — ABNORMAL LOW (ref 3.87–5.11)
RDW: 15.1 % (ref 11.5–15.5)
WBC Count: 4.8 10*3/uL (ref 4.0–10.5)
nRBC: 0 % (ref 0.0–0.2)

## 2019-07-12 LAB — TOTAL PROTEIN, URINE DIPSTICK

## 2019-07-12 LAB — CEA (IN HOUSE-CHCC): CEA (CHCC-In House): 6.03 ng/mL — ABNORMAL HIGH (ref 0.00–5.00)

## 2019-07-12 MED ORDER — SODIUM CHLORIDE 0.9 % IV SOLN
Freq: Once | INTRAVENOUS | Status: AC
  Filled 2019-07-12: qty 250

## 2019-07-12 MED ORDER — LEUCOVORIN CALCIUM INJECTION 350 MG
400.0000 mg/m2 | Freq: Once | INTRAVENOUS | Status: AC
  Administered 2019-07-12: 712 mg via INTRAVENOUS
  Filled 2019-07-12: qty 35.6

## 2019-07-12 MED ORDER — PROCHLORPERAZINE MALEATE 10 MG PO TABS
10.0000 mg | ORAL_TABLET | Freq: Once | ORAL | Status: AC
  Administered 2019-07-12: 12:00:00 10 mg via ORAL

## 2019-07-12 MED ORDER — SODIUM CHLORIDE 0.9 % IV SOLN
2400.0000 mg/m2 | INTRAVENOUS | Status: DC
  Administered 2019-07-12: 14:00:00 4250 mg via INTRAVENOUS
  Filled 2019-07-12: qty 85

## 2019-07-12 MED ORDER — SODIUM CHLORIDE 0.9 % IV SOLN
5.0000 mg/kg | Freq: Once | INTRAVENOUS | Status: AC
  Administered 2019-07-12: 350 mg via INTRAVENOUS
  Filled 2019-07-12: qty 14

## 2019-07-12 MED ORDER — PROCHLORPERAZINE MALEATE 10 MG PO TABS
ORAL_TABLET | ORAL | Status: AC
  Filled 2019-07-12: qty 1

## 2019-07-12 MED ORDER — FLUOROURACIL CHEMO INJECTION 2.5 GM/50ML
400.0000 mg/m2 | Freq: Once | INTRAVENOUS | Status: AC
  Administered 2019-07-12: 700 mg via INTRAVENOUS
  Filled 2019-07-12: qty 14
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
## 2019-07-12 NOTE — Telephone Encounter (Signed)
Scheduled appt per 1/20 los.  Spoke with pt and she is aware of her appt date and time.

## 2019-07-12 NOTE — Patient Instructions (Addendum)
COVID-19 Vaccine Information can be found at: [email or web address].uk For questions related to vaccine distribution or appointments, please email [email or web address] or call 8258839629.    [facility] Discharge Instructions for Patients Receiving Chemotherapy  Today you received the following chemotherapy agents:  Bevacizumab-bvzr (ZIRABEV), Leucovorin, Fluorouracil  To help prevent nausea and vomiting after your treatment, we encourage you to take your nausea medication as prescribed.   If you develop nausea and vomiting that is not controlled by your nausea medication, call the clinic.   BELOW ARE SYMPTOMS THAT SHOULD BE REPORTED IMMEDIATELY:  *FEVER GREATER THAN 100.5 F  *CHILLS WITH OR WITHOUT FEVER  NAUSEA AND VOMITING THAT IS NOT CONTROLLED WITH YOUR NAUSEA MEDICATION  *UNUSUAL SHORTNESS OF BREATH  *UNUSUAL BRUISING OR BLEEDING  TENDERNESS IN MOUTH AND THROAT WITH OR WITHOUT PRESENCE OF ULCERS  *URINARY PROBLEMS  *BOWEL PROBLEMS  UNUSUAL RASH Items with * indicate a potential emergency and should be followed up as soon as possible.  Feel free to call the clinic should you have any questions or concerns. The clinic phone number is [phone].  Please show the [facility] at check-in to the Emergency Department and triage nurse.

## 2019-07-13 DIAGNOSIS — D631 Anemia in chronic kidney disease: Secondary | ICD-10-CM | POA: Diagnosis not present

## 2019-07-13 DIAGNOSIS — C189 Malignant neoplasm of colon, unspecified: Secondary | ICD-10-CM | POA: Diagnosis not present

## 2019-07-13 DIAGNOSIS — Z9181 History of falling: Secondary | ICD-10-CM | POA: Diagnosis not present

## 2019-07-13 DIAGNOSIS — N1831 Chronic kidney disease, stage 3a: Secondary | ICD-10-CM | POA: Diagnosis not present

## 2019-07-13 DIAGNOSIS — S72141D Displaced intertrochanteric fracture of right femur, subsequent encounter for closed fracture with routine healing: Secondary | ICD-10-CM | POA: Diagnosis not present

## 2019-07-13 DIAGNOSIS — Z7901 Long term (current) use of anticoagulants: Secondary | ICD-10-CM | POA: Diagnosis not present

## 2019-07-13 DIAGNOSIS — Z79891 Long term (current) use of opiate analgesic: Secondary | ICD-10-CM | POA: Diagnosis not present

## 2019-07-13 DIAGNOSIS — D630 Anemia in neoplastic disease: Secondary | ICD-10-CM | POA: Diagnosis not present

## 2019-07-14 ENCOUNTER — Other Ambulatory Visit: Payer: Self-pay

## 2019-07-14 ENCOUNTER — Inpatient Hospital Stay: Payer: Medicare Other

## 2019-07-14 VITALS — BP 132/81 | HR 88 | Temp 98.3°F | Resp 16

## 2019-07-14 DIAGNOSIS — C786 Secondary malignant neoplasm of retroperitoneum and peritoneum: Secondary | ICD-10-CM | POA: Diagnosis not present

## 2019-07-14 DIAGNOSIS — C787 Secondary malignant neoplasm of liver and intrahepatic bile duct: Secondary | ICD-10-CM | POA: Diagnosis not present

## 2019-07-14 DIAGNOSIS — C189 Malignant neoplasm of colon, unspecified: Secondary | ICD-10-CM | POA: Diagnosis not present

## 2019-07-14 DIAGNOSIS — Z5111 Encounter for antineoplastic chemotherapy: Secondary | ICD-10-CM | POA: Diagnosis not present

## 2019-07-14 DIAGNOSIS — Z5112 Encounter for antineoplastic immunotherapy: Secondary | ICD-10-CM | POA: Diagnosis not present

## 2019-07-14 DIAGNOSIS — K6389 Other specified diseases of intestine: Secondary | ICD-10-CM

## 2019-07-14 DIAGNOSIS — M25551 Pain in right hip: Secondary | ICD-10-CM | POA: Diagnosis not present

## 2019-07-14 DIAGNOSIS — K769 Liver disease, unspecified: Secondary | ICD-10-CM

## 2019-07-14 MED ORDER — SODIUM CHLORIDE 0.9% FLUSH
10.0000 mL | INTRAVENOUS | Status: DC | PRN
  Administered 2019-07-14: 12:00:00 10 mL
  Filled 2019-07-14: qty 10

## 2019-07-14 MED ORDER — HEPARIN SOD (PORK) LOCK FLUSH 100 UNIT/ML IV SOLN
500.0000 [IU] | Freq: Once | INTRAVENOUS | Status: AC | PRN
  Administered 2019-07-14: 500 [IU]
  Filled 2019-07-14: qty 5

## 2019-07-17 DIAGNOSIS — C189 Malignant neoplasm of colon, unspecified: Secondary | ICD-10-CM | POA: Diagnosis not present

## 2019-07-17 DIAGNOSIS — Z79891 Long term (current) use of opiate analgesic: Secondary | ICD-10-CM | POA: Diagnosis not present

## 2019-07-17 DIAGNOSIS — D631 Anemia in chronic kidney disease: Secondary | ICD-10-CM | POA: Diagnosis not present

## 2019-07-17 DIAGNOSIS — S72141D Displaced intertrochanteric fracture of right femur, subsequent encounter for closed fracture with routine healing: Secondary | ICD-10-CM | POA: Diagnosis not present

## 2019-07-17 DIAGNOSIS — N1831 Chronic kidney disease, stage 3a: Secondary | ICD-10-CM | POA: Diagnosis not present

## 2019-07-17 DIAGNOSIS — D630 Anemia in neoplastic disease: Secondary | ICD-10-CM | POA: Diagnosis not present

## 2019-07-19 DIAGNOSIS — N1831 Chronic kidney disease, stage 3a: Secondary | ICD-10-CM | POA: Diagnosis not present

## 2019-07-19 DIAGNOSIS — C189 Malignant neoplasm of colon, unspecified: Secondary | ICD-10-CM | POA: Diagnosis not present

## 2019-07-19 DIAGNOSIS — D630 Anemia in neoplastic disease: Secondary | ICD-10-CM | POA: Diagnosis not present

## 2019-07-19 DIAGNOSIS — D631 Anemia in chronic kidney disease: Secondary | ICD-10-CM | POA: Diagnosis not present

## 2019-07-19 DIAGNOSIS — S72141D Displaced intertrochanteric fracture of right femur, subsequent encounter for closed fracture with routine healing: Secondary | ICD-10-CM | POA: Diagnosis not present

## 2019-07-19 DIAGNOSIS — Z79891 Long term (current) use of opiate analgesic: Secondary | ICD-10-CM | POA: Diagnosis not present

## 2019-07-24 DIAGNOSIS — S72141D Displaced intertrochanteric fracture of right femur, subsequent encounter for closed fracture with routine healing: Secondary | ICD-10-CM | POA: Diagnosis not present

## 2019-07-24 DIAGNOSIS — C189 Malignant neoplasm of colon, unspecified: Secondary | ICD-10-CM | POA: Diagnosis not present

## 2019-07-24 DIAGNOSIS — D631 Anemia in chronic kidney disease: Secondary | ICD-10-CM | POA: Diagnosis not present

## 2019-07-24 DIAGNOSIS — D630 Anemia in neoplastic disease: Secondary | ICD-10-CM | POA: Diagnosis not present

## 2019-07-24 DIAGNOSIS — N1831 Chronic kidney disease, stage 3a: Secondary | ICD-10-CM | POA: Diagnosis not present

## 2019-07-24 DIAGNOSIS — Z79891 Long term (current) use of opiate analgesic: Secondary | ICD-10-CM | POA: Diagnosis not present

## 2019-07-25 DIAGNOSIS — N1831 Chronic kidney disease, stage 3a: Secondary | ICD-10-CM | POA: Diagnosis not present

## 2019-07-25 DIAGNOSIS — S72141D Displaced intertrochanteric fracture of right femur, subsequent encounter for closed fracture with routine healing: Secondary | ICD-10-CM | POA: Diagnosis not present

## 2019-07-25 DIAGNOSIS — C189 Malignant neoplasm of colon, unspecified: Secondary | ICD-10-CM | POA: Diagnosis not present

## 2019-07-25 DIAGNOSIS — D631 Anemia in chronic kidney disease: Secondary | ICD-10-CM | POA: Diagnosis not present

## 2019-07-25 DIAGNOSIS — D630 Anemia in neoplastic disease: Secondary | ICD-10-CM | POA: Diagnosis not present

## 2019-07-25 DIAGNOSIS — Z79891 Long term (current) use of opiate analgesic: Secondary | ICD-10-CM | POA: Diagnosis not present

## 2019-07-26 ENCOUNTER — Inpatient Hospital Stay (HOSPITAL_BASED_OUTPATIENT_CLINIC_OR_DEPARTMENT_OTHER): Payer: Medicare Other | Admitting: Oncology

## 2019-07-26 ENCOUNTER — Other Ambulatory Visit: Payer: Self-pay

## 2019-07-26 ENCOUNTER — Inpatient Hospital Stay: Payer: Medicare Other | Attending: Oncology

## 2019-07-26 ENCOUNTER — Inpatient Hospital Stay: Payer: Medicare Other

## 2019-07-26 ENCOUNTER — Telehealth: Payer: Self-pay | Admitting: Oncology

## 2019-07-26 VITALS — BP 141/92 | HR 89 | Temp 97.7°F | Resp 17 | Ht 64.0 in | Wt 135.4 lb

## 2019-07-26 DIAGNOSIS — R634 Abnormal weight loss: Secondary | ICD-10-CM | POA: Diagnosis not present

## 2019-07-26 DIAGNOSIS — C189 Malignant neoplasm of colon, unspecified: Secondary | ICD-10-CM

## 2019-07-26 DIAGNOSIS — R350 Frequency of micturition: Secondary | ICD-10-CM | POA: Insufficient documentation

## 2019-07-26 DIAGNOSIS — Z5112 Encounter for antineoplastic immunotherapy: Secondary | ICD-10-CM | POA: Diagnosis not present

## 2019-07-26 DIAGNOSIS — C786 Secondary malignant neoplasm of retroperitoneum and peritoneum: Secondary | ICD-10-CM | POA: Insufficient documentation

## 2019-07-26 DIAGNOSIS — Z95828 Presence of other vascular implants and grafts: Secondary | ICD-10-CM

## 2019-07-26 DIAGNOSIS — C787 Secondary malignant neoplasm of liver and intrahepatic bile duct: Secondary | ICD-10-CM | POA: Diagnosis not present

## 2019-07-26 DIAGNOSIS — K5900 Constipation, unspecified: Secondary | ICD-10-CM | POA: Insufficient documentation

## 2019-07-26 DIAGNOSIS — Z5111 Encounter for antineoplastic chemotherapy: Secondary | ICD-10-CM | POA: Insufficient documentation

## 2019-07-26 DIAGNOSIS — R109 Unspecified abdominal pain: Secondary | ICD-10-CM | POA: Insufficient documentation

## 2019-07-26 DIAGNOSIS — K6389 Other specified diseases of intestine: Secondary | ICD-10-CM

## 2019-07-26 DIAGNOSIS — K769 Liver disease, unspecified: Secondary | ICD-10-CM

## 2019-07-26 DIAGNOSIS — G629 Polyneuropathy, unspecified: Secondary | ICD-10-CM | POA: Diagnosis not present

## 2019-07-26 DIAGNOSIS — R112 Nausea with vomiting, unspecified: Secondary | ICD-10-CM | POA: Diagnosis not present

## 2019-07-26 DIAGNOSIS — Z79899 Other long term (current) drug therapy: Secondary | ICD-10-CM | POA: Insufficient documentation

## 2019-07-26 LAB — CMP (CANCER CENTER ONLY)
ALT: 9 U/L (ref 0–44)
AST: 16 U/L (ref 15–41)
Albumin: 3.6 g/dL (ref 3.5–5.0)
Alkaline Phosphatase: 95 U/L (ref 38–126)
Anion gap: 9 (ref 5–15)
BUN: 14 mg/dL (ref 8–23)
CO2: 26 mmol/L (ref 22–32)
Calcium: 9.3 mg/dL (ref 8.9–10.3)
Chloride: 106 mmol/L (ref 98–111)
Creatinine: 0.91 mg/dL (ref 0.44–1.00)
GFR, Est AFR Am: >60 mL/min (ref >60)
GFR, Estimated: >60 mL/min (ref >60)
Glucose, Bld: 89 mg/dL (ref 70–99)
Potassium: 4 mmol/L (ref 3.5–5.1)
Sodium: 141 mmol/L (ref 135–145)
Total Bilirubin: 0.5 mg/dL (ref 0.3–1.2)
Total Protein: 7.5 g/dL (ref 6.5–8.1)

## 2019-07-26 LAB — CBC WITH DIFFERENTIAL (CANCER CENTER ONLY)
Abs Immature Granulocytes: 0.01 10*3/uL (ref 0.00–0.07)
Basophils Absolute: 0 10*3/uL (ref 0.0–0.1)
Basophils Relative: 0 %
Eosinophils Absolute: 0.1 10*3/uL (ref 0.0–0.5)
Eosinophils Relative: 1 %
HCT: 37.6 % (ref 36.0–46.0)
Hemoglobin: 12.2 g/dL (ref 12.0–15.0)
Immature Granulocytes: 0 %
Lymphocytes Relative: 33 %
Lymphs Abs: 1.5 10*3/uL (ref 0.7–4.0)
MCH: 31.9 pg (ref 26.0–34.0)
MCHC: 32.4 g/dL (ref 30.0–36.0)
MCV: 98.4 fL (ref 80.0–100.0)
Monocytes Absolute: 0.4 10*3/uL (ref 0.1–1.0)
Monocytes Relative: 9 %
Neutro Abs: 2.6 10*3/uL (ref 1.7–7.7)
Neutrophils Relative %: 57 %
Platelet Count: 227 10*3/uL (ref 150–400)
RBC: 3.82 MIL/uL — ABNORMAL LOW (ref 3.87–5.11)
RDW: 14.9 % (ref 11.5–15.5)
WBC Count: 4.6 10*3/uL (ref 4.0–10.5)
nRBC: 0 % (ref 0.0–0.2)

## 2019-07-26 LAB — CEA (IN HOUSE-CHCC): CEA (CHCC-In House): 4.44 ng/mL (ref 0.00–5.00)

## 2019-07-26 MED ORDER — SODIUM CHLORIDE 0.9 % IV SOLN
2400.0000 mg/m2 | INTRAVENOUS | Status: DC
  Administered 2019-07-26: 4250 mg via INTRAVENOUS
  Filled 2019-07-26: qty 85

## 2019-07-26 MED ORDER — PROCHLORPERAZINE MALEATE 10 MG PO TABS
ORAL_TABLET | ORAL | Status: AC
  Filled 2019-07-26: qty 1

## 2019-07-26 MED ORDER — LEUCOVORIN CALCIUM INJECTION 350 MG
400.0000 mg/m2 | Freq: Once | INTRAVENOUS | Status: AC
  Administered 2019-07-26: 12:00:00 712 mg via INTRAVENOUS
  Filled 2019-07-26: qty 35.6

## 2019-07-26 MED ORDER — HYDROCODONE-ACETAMINOPHEN 5-325 MG PO TABS: 1.0000 | ORAL_TABLET | Freq: Four times a day (QID) | ORAL | 0 refills | Status: DC | PRN

## 2019-07-26 MED ORDER — SODIUM CHLORIDE 0.9 % IV SOLN
Freq: Once | INTRAVENOUS | Status: AC
  Filled 2019-07-26: qty 250

## 2019-07-26 MED ORDER — SENNOSIDES-DOCUSATE SODIUM 8.6-50 MG PO TABS: 1.0000 | ORAL_TABLET | Freq: Two times a day (BID) | ORAL | 1 refills | Status: DC

## 2019-07-26 MED ORDER — SODIUM CHLORIDE 0.9 % IV SOLN
5.0000 mg/kg | Freq: Once | INTRAVENOUS | Status: AC
  Administered 2019-07-26: 12:00:00 300 mg via INTRAVENOUS
  Filled 2019-07-26: qty 12

## 2019-07-26 MED ORDER — FLUOROURACIL CHEMO INJECTION 2.5 GM/50ML
400.0000 mg/m2 | Freq: Once | INTRAVENOUS | Status: AC
  Administered 2019-07-26: 700 mg via INTRAVENOUS
  Filled 2019-07-26: qty 14

## 2019-07-26 MED ORDER — PROCHLORPERAZINE MALEATE 10 MG PO TABS
10.0000 mg | ORAL_TABLET | Freq: Once | ORAL | Status: AC
  Administered 2019-07-26: 10 mg via ORAL

## 2019-07-26 MED ORDER — SODIUM CHLORIDE 0.9% FLUSH
10.0000 mL | Freq: Once | INTRAVENOUS | Status: AC
  Administered 2019-07-26: 10 mL
  Filled 2019-07-26: qty 10

## 2019-07-26 NOTE — Patient Instructions (Signed)
[facility] Discharge Instructions for Patients Receiving Chemotherapy  Today you received the following chemotherapy agents: Bevacizumab, Leucovorin, 5FU  To help prevent nausea and vomiting after your treatment, we encourage you to take your nausea medication as directed.   If you develop nausea and vomiting that is not controlled by your nausea medication, call the clinic.   BELOW ARE SYMPTOMS THAT SHOULD BE REPORTED IMMEDIATELY:  *FEVER GREATER THAN 100.5 F  *CHILLS WITH OR WITHOUT FEVER  NAUSEA AND VOMITING THAT IS NOT CONTROLLED WITH YOUR NAUSEA MEDICATION  *UNUSUAL SHORTNESS OF BREATH  *UNUSUAL BRUISING OR BLEEDING  TENDERNESS IN MOUTH AND THROAT WITH OR WITHOUT PRESENCE OF ULCERS  *URINARY PROBLEMS  *BOWEL PROBLEMS  UNUSUAL RASH Items with * indicate a potential emergency and should be followed up as soon as possible.  Feel free to call the clinic should you have any questions or concerns. The clinic phone number is [phone].  Please show the [facility] at check-in to the Emergency Department and triage nurse.

## 2019-07-26 NOTE — Telephone Encounter (Signed)
Scheduled appt per 2/3 los.

## 2019-07-26 NOTE — Patient Instructions (Signed)

## 2019-07-26 NOTE — Progress Notes (Signed)
Hematology and Oncology Follow Up Visit  Margaret Elliott [id number] 25-Feb-1951    07/26/19   Principle Diagnosis: 69 year old woman with colon cancer diagnosed in 2014.  She was found to have stage IV disease with NRAS mutation and peritoneal involvement in 2017.   Prior Therapy:  She is status post laparoscopic laparotomy right hemicolectomy with ileocolonic anastomosis done on December 09, 2012.   FOLFOX and a Avastin chemotherapy started 01/18/2013. She is S/P 12 cycles completed in 05/2013.   She is a status post microwave ablation of metastatic lesion in the posterior segment of the right lobe of the liver completed on 09/28/2014 and repeated on 11/30/2014.  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  FOLFIRI and a Avastin salvage therapy started on 12/10/2015.  Therapy discontinued and maintained on 5-FU leucovorin with a Avastin only till March 2020.  FOLFIRI and a Avastin restarted on 10/05/2018.   She is status post 12 cycles of therapy.    Current therapy:    Maintenance 5-FU, leucovorin and a Avastin maintenance restarted on October 7 of 2020.  She is here for the next cycle of therapy.   Interim History:  Margaret Elliott presents today for a follow-up evaluation.  Since her last visit, she continues to tolerate chemotherapy without any major complaints.  She denies any nausea, vomiting or a decrease in her appetite.  She has reported some weight loss and some constipation as well as increase in her abdominal pain.  She does use hydrocodone which has increased because of her hip surgery which can cause her constipation.  She has been using Colace although her bowel movements have been erratic.                  Medications: Unchanged on review. Current Outpatient Prescriptions  Medication Sig Dispense Refill  . acetaminophen (TYLENOL) 500 MG tablet Take 500 mg by mouth every 6 (six) hours as  needed for pain.      . diphenhydrAMINE (BENADRYL) 25 mg capsule Take 25-50 mg by mouth daily as needed for itching. For allergic reaction      . lidocaine-prilocaine (EMLA) cream Apply 1 application topically as needed. Apply approx 1/2 tsp to skin over port, prior to chemotherapy treatments  1 kit  3  . Multiple Vitamin (MULTIVITAMIN) tablet Take 1 tablet by mouth daily.      . ondansetron (ZOFRAN) 8 MG tablet Take 1 tablet (8 mg total) by mouth every 8 (eight) hours as needed for nausea.  30 tablet  1   No current facility-administered medications for this visit.     Allergies: No Known Allergies      Physical Exam:       Blood pressure (!) 141/92, pulse 89, temperature 97.7 F (36.5 C), temperature source Temporal, resp. rate 17, height _0  (1.626 m), weight 135 lb 6.4 oz (61.4 kg), SpO2 100 %.    ECOG: 0     General appearance: Alert, awake without any distress. Head: Atraumatic without abnormalities Oropharynx: Without any thrush or ulcers. Eyes: No scleral icterus. Lymph nodes: No lymphadenopathy noted in the cervical, supraclavicular, or axillary nodes Heart:regular rate and rhythm, without any murmurs or gallops.   Lung: Clear to auscultation without any rhonchi, wheezes or dullness to percussion. Abdomin: Soft, nontender without any shifting dullness or ascites. Musculoskeletal: No clubbing or cyanosis. Neurological: No motor or sensory deficits. Skin: No rashes or lesions. Psychiatric: Mood and  affect appeared normal.                CBC    Component Value Date/Time   WBC 4.8 07/12/2019 0955   WBC 6.9 06/10/2019 0321   RBC 3.56 (L) 07/12/2019 0955   HGB 11.4 (L) 07/12/2019 0955   HGB 9.7 (L) 06/14/2019 1634   HGB 12.3 05/26/2017 0809   HCT 36.2 07/12/2019 0955   HCT 29.4 (L) 06/14/2019 1634   HCT 37.6 05/26/2017 0809   PLT 246 07/12/2019 0955   PLT 298 06/14/2019 1634   MCV 101.7 (H) 07/12/2019 0955   MCV 98 (H) 06/14/2019 1634    MCV 100.3 05/26/2017 0809   MCH 32.0 07/12/2019 0955   MCHC 31.5 07/12/2019 0955   RDW 15.1 07/12/2019 0955   RDW 15.7 (H) 06/14/2019 1634   RDW 17.4 (H) 05/26/2017 0809   LYMPHSABS 1.6 07/12/2019 0955   LYMPHSABS 1.2 06/14/2019 1634   LYMPHSABS 1.2 05/26/2017 0809   MONOABS 0.4 07/12/2019 0955   MONOABS 0.5 05/26/2017 0809   EOSABS 0.1 07/12/2019 0955   EOSABS 0.0 06/14/2019 1634   BASOSABS 0.0 07/12/2019 0955   BASOSABS 0.0 06/14/2019 1634   BASOSABS 0.0 05/26/2017 0809     Results for Margaret Elliott (MRN [id number]) as of 07/26/2019 09:33  Ref. Range 05/24/2019 10:06 07/12/2019 09:55  CEA ([facility]) Latest Ref Range: 0.00 - 5.00 ng/mL 4.28 6.03 (H)      Impression and Plan:  69 year old woman with:   1.  Stage IV colon cancer with peritoneal involvement since 2017.  She presented with hepatic metastasis diagnosed in 2014.     She remains on maintenance chemotherapy at this time without any complications.  Her CEA showed slight increase after missing doses of chemotherapy related to her hip surgery.  Risks and benefits of continuing this approach was discussed and will repeat imaging studies in 2 to 3 months which will be done sooner if she develops any symptoms.  Her last scan was reviewed again in December 2020 and was discussed with the patient today.    2.  IV access: Port-A-Cath currently in use without any issues.   3. Neuropathy: No exacerbation noted at this time.  4.  Abdominal pain: Related to constipation.  We have discussed strategies to improve her bowel habits.  I will prescribe Senokot-S with MiraLAX.  5.  Right femur fracture: Healing well at this time after traumatic injury.  6.  Prognosis: Therapy remains palliative although aggressive measures are warranted at this time..  7. Followup: She will return in 2 weeks for the next cycle of therapy.  30 minutes was dedicated to the visit today.  The time was spent on reviewing laboratory data,  reviewing imaging studies as well as future plan of care discussion.  Zola Button MD 07/26/19

## 2019-07-28 ENCOUNTER — Inpatient Hospital Stay: Payer: Medicare Other

## 2019-07-28 ENCOUNTER — Other Ambulatory Visit: Payer: Self-pay

## 2019-07-28 VITALS — BP 140/82 | HR 87 | Temp 98.1°F | Resp 18

## 2019-07-28 DIAGNOSIS — Z5112 Encounter for antineoplastic immunotherapy: Secondary | ICD-10-CM | POA: Diagnosis not present

## 2019-07-28 DIAGNOSIS — C189 Malignant neoplasm of colon, unspecified: Secondary | ICD-10-CM

## 2019-07-28 DIAGNOSIS — C786 Secondary malignant neoplasm of retroperitoneum and peritoneum: Secondary | ICD-10-CM | POA: Diagnosis not present

## 2019-07-28 DIAGNOSIS — C787 Secondary malignant neoplasm of liver and intrahepatic bile duct: Secondary | ICD-10-CM | POA: Diagnosis not present

## 2019-07-28 DIAGNOSIS — Z95828 Presence of other vascular implants and grafts: Secondary | ICD-10-CM

## 2019-07-28 DIAGNOSIS — Z5111 Encounter for antineoplastic chemotherapy: Secondary | ICD-10-CM | POA: Diagnosis not present

## 2019-07-28 DIAGNOSIS — K5900 Constipation, unspecified: Secondary | ICD-10-CM | POA: Diagnosis not present

## 2019-07-28 MED ORDER — HEPARIN SOD (PORK) LOCK FLUSH 100 UNIT/ML IV SOLN
500.0000 [IU] | Freq: Once | INTRAVENOUS | Status: AC | PRN
  Administered 2019-07-28: 500 [IU] via INTRAVENOUS
  Filled 2019-07-28: qty 5

## 2019-07-28 MED ORDER — SODIUM CHLORIDE 0.9% FLUSH
10.0000 mL | Freq: Once | INTRAVENOUS | Status: AC
  Administered 2019-07-28: 10 mL
  Filled 2019-07-28: qty 10

## 2019-07-28 NOTE — Patient Instructions (Signed)

## 2019-07-31 DIAGNOSIS — N1831 Chronic kidney disease, stage 3a: Secondary | ICD-10-CM | POA: Diagnosis not present

## 2019-07-31 DIAGNOSIS — D630 Anemia in neoplastic disease: Secondary | ICD-10-CM | POA: Diagnosis not present

## 2019-07-31 DIAGNOSIS — Z79891 Long term (current) use of opiate analgesic: Secondary | ICD-10-CM | POA: Diagnosis not present

## 2019-07-31 DIAGNOSIS — C189 Malignant neoplasm of colon, unspecified: Secondary | ICD-10-CM | POA: Diagnosis not present

## 2019-07-31 DIAGNOSIS — S72141D Displaced intertrochanteric fracture of right femur, subsequent encounter for closed fracture with routine healing: Secondary | ICD-10-CM | POA: Diagnosis not present

## 2019-07-31 DIAGNOSIS — D631 Anemia in chronic kidney disease: Secondary | ICD-10-CM | POA: Diagnosis not present

## 2019-08-07 DIAGNOSIS — N1831 Chronic kidney disease, stage 3a: Secondary | ICD-10-CM | POA: Diagnosis not present

## 2019-08-07 DIAGNOSIS — D630 Anemia in neoplastic disease: Secondary | ICD-10-CM | POA: Diagnosis not present

## 2019-08-07 DIAGNOSIS — S72141D Displaced intertrochanteric fracture of right femur, subsequent encounter for closed fracture with routine healing: Secondary | ICD-10-CM | POA: Diagnosis not present

## 2019-08-07 DIAGNOSIS — Z79891 Long term (current) use of opiate analgesic: Secondary | ICD-10-CM | POA: Diagnosis not present

## 2019-08-07 DIAGNOSIS — C189 Malignant neoplasm of colon, unspecified: Secondary | ICD-10-CM | POA: Diagnosis not present

## 2019-08-07 DIAGNOSIS — D631 Anemia in chronic kidney disease: Secondary | ICD-10-CM | POA: Diagnosis not present

## 2019-08-09 ENCOUNTER — Inpatient Hospital Stay (HOSPITAL_BASED_OUTPATIENT_CLINIC_OR_DEPARTMENT_OTHER): Payer: Medicare Other | Admitting: Medical

## 2019-08-09 ENCOUNTER — Inpatient Hospital Stay (HOSPITAL_BASED_OUTPATIENT_CLINIC_OR_DEPARTMENT_OTHER): Payer: Medicare Other | Admitting: Oncology

## 2019-08-09 ENCOUNTER — Inpatient Hospital Stay: Payer: Medicare Other

## 2019-08-09 ENCOUNTER — Other Ambulatory Visit: Payer: Self-pay

## 2019-08-09 ENCOUNTER — Other Ambulatory Visit: Payer: Self-pay | Admitting: Medical

## 2019-08-09 VITALS — BP 142/93 | HR 85 | Temp 97.4°F | Resp 18 | Ht 64.0 in | Wt 135.8 lb

## 2019-08-09 VITALS — BP 160/94 | HR 79

## 2019-08-09 DIAGNOSIS — R109 Unspecified abdominal pain: Secondary | ICD-10-CM

## 2019-08-09 DIAGNOSIS — C189 Malignant neoplasm of colon, unspecified: Secondary | ICD-10-CM | POA: Diagnosis not present

## 2019-08-09 DIAGNOSIS — R110 Nausea: Secondary | ICD-10-CM

## 2019-08-09 DIAGNOSIS — C787 Secondary malignant neoplasm of liver and intrahepatic bile duct: Secondary | ICD-10-CM | POA: Diagnosis not present

## 2019-08-09 DIAGNOSIS — Z95828 Presence of other vascular implants and grafts: Secondary | ICD-10-CM

## 2019-08-09 DIAGNOSIS — C786 Secondary malignant neoplasm of retroperitoneum and peritoneum: Secondary | ICD-10-CM | POA: Diagnosis not present

## 2019-08-09 DIAGNOSIS — R1084 Generalized abdominal pain: Secondary | ICD-10-CM

## 2019-08-09 DIAGNOSIS — K769 Liver disease, unspecified: Secondary | ICD-10-CM

## 2019-08-09 DIAGNOSIS — Z5111 Encounter for antineoplastic chemotherapy: Secondary | ICD-10-CM | POA: Diagnosis not present

## 2019-08-09 DIAGNOSIS — Z5112 Encounter for antineoplastic immunotherapy: Secondary | ICD-10-CM | POA: Diagnosis not present

## 2019-08-09 DIAGNOSIS — K6389 Other specified diseases of intestine: Secondary | ICD-10-CM

## 2019-08-09 DIAGNOSIS — K5900 Constipation, unspecified: Secondary | ICD-10-CM | POA: Diagnosis not present

## 2019-08-09 LAB — CMP (CANCER CENTER ONLY)
ALT: 12 U/L (ref 0–44)
AST: 16 U/L (ref 15–41)
Albumin: 3.5 g/dL (ref 3.5–5.0)
Alkaline Phosphatase: 90 U/L (ref 38–126)
Anion gap: 7 (ref 5–15)
BUN: 14 mg/dL (ref 8–23)
CO2: 27 mmol/L (ref 22–32)
Calcium: 9.3 mg/dL (ref 8.9–10.3)
Chloride: 107 mmol/L (ref 98–111)
Creatinine: 1.11 mg/dL — ABNORMAL HIGH (ref 0.44–1.00)
GFR, Est AFR Am: 59 mL/min — ABNORMAL LOW (ref >60)
GFR, Estimated: 51 mL/min — ABNORMAL LOW (ref >60)
Glucose, Bld: 132 mg/dL — ABNORMAL HIGH (ref 70–99)
Potassium: 4 mmol/L (ref 3.5–5.1)
Sodium: 141 mmol/L (ref 135–145)
Total Bilirubin: 0.5 mg/dL (ref 0.3–1.2)
Total Protein: 7.1 g/dL (ref 6.5–8.1)

## 2019-08-09 LAB — CBC WITH DIFFERENTIAL (CANCER CENTER ONLY)
Abs Immature Granulocytes: 0.01 10*3/uL (ref 0.00–0.07)
Basophils Absolute: 0 10*3/uL (ref 0.0–0.1)
Basophils Relative: 0 %
Eosinophils Absolute: 0 10*3/uL (ref 0.0–0.5)
Eosinophils Relative: 1 %
HCT: 37.5 % (ref 36.0–46.0)
Hemoglobin: 12.1 g/dL (ref 12.0–15.0)
Immature Granulocytes: 0 %
Lymphocytes Relative: 37 %
Lymphs Abs: 1.7 10*3/uL (ref 0.7–4.0)
MCH: 31.8 pg (ref 26.0–34.0)
MCHC: 32.3 g/dL (ref 30.0–36.0)
MCV: 98.4 fL (ref 80.0–100.0)
Monocytes Absolute: 0.5 10*3/uL (ref 0.1–1.0)
Monocytes Relative: 10 %
Neutro Abs: 2.4 10*3/uL (ref 1.7–7.7)
Neutrophils Relative %: 52 %
Platelet Count: 220 10*3/uL (ref 150–400)
RBC: 3.81 MIL/uL — ABNORMAL LOW (ref 3.87–5.11)
RDW: 15.1 % (ref 11.5–15.5)
WBC Count: 4.7 10*3/uL (ref 4.0–10.5)
nRBC: 0 % (ref 0.0–0.2)

## 2019-08-09 LAB — URINALYSIS, COMPLETE (UACMP) WITH MICROSCOPIC
Bacteria, UA: NONE SEEN
Bilirubin Urine: NEGATIVE
Glucose, UA: NEGATIVE mg/dL
Hgb urine dipstick: NEGATIVE
Ketones, ur: NEGATIVE mg/dL
Leukocytes,Ua: NEGATIVE
Nitrite: NEGATIVE
Protein, ur: NEGATIVE mg/dL
Specific Gravity, Urine: 1.005 (ref 1.005–1.030)
pH: 6 (ref 5.0–8.0)

## 2019-08-09 LAB — CEA (IN HOUSE-CHCC): CEA (CHCC-In House): 8.37 ng/mL — ABNORMAL HIGH (ref 0.00–5.00)

## 2019-08-09 MED ORDER — LORAZEPAM 2 MG/ML IJ SOLN
INTRAMUSCULAR | Status: AC
  Filled 2019-08-09: qty 1

## 2019-08-09 MED ORDER — FLUOROURACIL CHEMO INJECTION 2.5 GM/50ML
400.0000 mg/m2 | Freq: Once | INTRAVENOUS | Status: AC
  Administered 2019-08-09: 700 mg via INTRAVENOUS
  Filled 2019-08-09: qty 14

## 2019-08-09 MED ORDER — SODIUM CHLORIDE 0.9 % IV SOLN
5.0000 mg/kg | Freq: Once | INTRAVENOUS | Status: AC
  Administered 2019-08-09: 300 mg via INTRAVENOUS
  Filled 2019-08-09: qty 12

## 2019-08-09 MED ORDER — SODIUM CHLORIDE 0.9 % IV SOLN
Freq: Once | INTRAVENOUS | Status: AC
  Filled 2019-08-09: qty 250

## 2019-08-09 MED ORDER — SODIUM CHLORIDE 0.9% FLUSH
10.0000 mL | Freq: Once | INTRAVENOUS | Status: AC
  Administered 2019-08-09: 10 mL
  Filled 2019-08-09: qty 10

## 2019-08-09 MED ORDER — DIPHENOXYLATE-ATROPINE 2.5-0.025 MG PO TABS
ORAL_TABLET | ORAL | Status: AC
  Filled 2019-08-09: qty 1

## 2019-08-09 MED ORDER — SODIUM CHLORIDE 0.9 % IV SOLN
2400.0000 mg/m2 | INTRAVENOUS | Status: DC
  Administered 2019-08-09: 4250 mg via INTRAVENOUS
  Filled 2019-08-09: qty 85

## 2019-08-09 MED ORDER — PROCHLORPERAZINE MALEATE 10 MG PO TABS
ORAL_TABLET | ORAL | Status: AC
  Filled 2019-08-09: qty 1

## 2019-08-09 MED ORDER — DIPHENOXYLATE-ATROPINE 2.5-0.025 MG PO TABS
1.0000 | ORAL_TABLET | Freq: Once | ORAL | Status: AC
  Administered 2019-08-09: 14:00:00 1 via ORAL

## 2019-08-09 MED ORDER — ATROPINE SULFATE 1 MG/ML IJ SOLN
INTRAMUSCULAR | Status: AC
  Filled 2019-08-09: qty 1

## 2019-08-09 MED ORDER — LORAZEPAM 2 MG/ML IJ SOLN
0.5000 mg | Freq: Once | INTRAMUSCULAR | Status: AC
  Administered 2019-08-09: 0.5 mg via INTRAVENOUS

## 2019-08-09 MED ORDER — LEUCOVORIN CALCIUM INJECTION 350 MG
400.0000 mg/m2 | Freq: Once | INTRAVENOUS | Status: AC
  Administered 2019-08-09: 712 mg via INTRAVENOUS
  Filled 2019-08-09: qty 35.6

## 2019-08-09 MED ORDER — PROCHLORPERAZINE MALEATE 10 MG PO TABS
10.0000 mg | ORAL_TABLET | Freq: Once | ORAL | Status: AC
  Administered 2019-08-09: 10 mg via ORAL

## 2019-08-09 MED ORDER — DIPHENOXYLATE-ATROPINE 2.5-0.025 MG PO TABS: 1.0000 | ORAL_TABLET | Freq: Once | ORAL | Status: DC

## 2019-08-09 MED ORDER — SODIUM CHLORIDE 0.9% FLUSH
10.0000 mL | INTRAVENOUS | Status: DC | PRN
  Administered 2019-08-09: 10 mL
  Filled 2019-08-09: qty 10

## 2019-08-09 NOTE — Patient Instructions (Signed)
[facility] Discharge Instructions for Patients Receiving Chemotherapy  Today you received the following chemotherapy agents: Bevacizumab, Leucovorin, 5FU  To help prevent nausea and vomiting after your treatment, we encourage you to take your nausea medication as directed.   If you develop nausea and vomiting that is not controlled by your nausea medication, call the clinic.   BELOW ARE SYMPTOMS THAT SHOULD BE REPORTED IMMEDIATELY:  *FEVER GREATER THAN 100.5 F  *CHILLS WITH OR WITHOUT FEVER  NAUSEA AND VOMITING THAT IS NOT CONTROLLED WITH YOUR NAUSEA MEDICATION  *UNUSUAL SHORTNESS OF BREATH  *UNUSUAL BRUISING OR BLEEDING  TENDERNESS IN MOUTH AND THROAT WITH OR WITHOUT PRESENCE OF ULCERS  *URINARY PROBLEMS  *BOWEL PROBLEMS  UNUSUAL RASH Items with * indicate a potential emergency and should be followed up as soon as possible.  Feel free to call the clinic should you have any questions or concerns. The clinic phone number is [phone].  Please show the [facility] at check-in to the Emergency Department and triage nurse.

## 2019-08-09 NOTE — Progress Notes (Signed)
Ms. Cashman was seen in the infusion room today.  She had been seen by Dr. Alen Blew earlier and had a urine analysis collected as she was having abdominal pain.  Her urinalysis returned negative.  Urine culture is pending.  She was having nausea and had been given Ativan 0.5 mg IV x1.  She continues to have nausea and is having abdominal breathing.  She was given Lomotil x1 and was given an additional dose of Ativan 0.5 mg IV x1.  Sandi Mealy, MHS, PA-C Physician Assistant

## 2019-08-09 NOTE — Progress Notes (Signed)
Pt's pre bevacizumab BP is 165/104 with dinamap and 168/102 manually.  Pt is also have abdominal pain & vomiting.  Dr. Alen Blew informed, ok to proceed with avastin, Ativan ordered for patient.
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
## 2019-08-10 ENCOUNTER — Telehealth: Payer: Self-pay | Admitting: Oncology

## 2019-08-10 LAB — URINE CULTURE: Culture: NO GROWTH

## 2019-08-10 NOTE — Telephone Encounter (Signed)
Scheduled appt per 2/17 los

## 2019-08-11 ENCOUNTER — Other Ambulatory Visit: Payer: Self-pay

## 2019-08-11 ENCOUNTER — Inpatient Hospital Stay: Payer: Medicare Other

## 2019-08-11 VITALS — BP 142/80 | HR 76 | Temp 98.5°F | Resp 18

## 2019-08-11 DIAGNOSIS — C786 Secondary malignant neoplasm of retroperitoneum and peritoneum: Secondary | ICD-10-CM | POA: Diagnosis not present

## 2019-08-11 DIAGNOSIS — C787 Secondary malignant neoplasm of liver and intrahepatic bile duct: Secondary | ICD-10-CM | POA: Diagnosis not present

## 2019-08-11 DIAGNOSIS — C189 Malignant neoplasm of colon, unspecified: Secondary | ICD-10-CM | POA: Diagnosis not present

## 2019-08-11 DIAGNOSIS — K6389 Other specified diseases of intestine: Secondary | ICD-10-CM

## 2019-08-11 DIAGNOSIS — Z5112 Encounter for antineoplastic immunotherapy: Secondary | ICD-10-CM | POA: Diagnosis not present

## 2019-08-11 DIAGNOSIS — K769 Liver disease, unspecified: Secondary | ICD-10-CM

## 2019-08-11 DIAGNOSIS — Z5111 Encounter for antineoplastic chemotherapy: Secondary | ICD-10-CM | POA: Diagnosis not present

## 2019-08-11 DIAGNOSIS — K5900 Constipation, unspecified: Secondary | ICD-10-CM | POA: Diagnosis not present

## 2019-08-11 MED ORDER — HEPARIN SOD (PORK) LOCK FLUSH 100 UNIT/ML IV SOLN
500.0000 [IU] | Freq: Once | INTRAVENOUS | Status: AC | PRN
  Administered 2019-08-11: 12:00:00 500 [IU]
  Filled 2019-08-11: qty 5

## 2019-08-11 MED ORDER — SODIUM CHLORIDE 0.9% FLUSH
10.0000 mL | INTRAVENOUS | Status: DC | PRN
  Administered 2019-08-11: 10 mL
  Filled 2019-08-11: qty 10

## 2019-08-23 ENCOUNTER — Inpatient Hospital Stay: Payer: Medicare Other

## 2019-08-23 ENCOUNTER — Other Ambulatory Visit: Payer: Self-pay

## 2019-08-23 ENCOUNTER — Inpatient Hospital Stay (HOSPITAL_BASED_OUTPATIENT_CLINIC_OR_DEPARTMENT_OTHER): Payer: Medicare Other | Admitting: Oncology

## 2019-08-23 ENCOUNTER — Inpatient Hospital Stay: Payer: Medicare Other | Attending: Oncology

## 2019-08-23 VITALS — BP 161/90

## 2019-08-23 VITALS — BP 166/96 | HR 96 | Temp 98.6°F | Resp 18 | Ht 64.0 in | Wt 133.7 lb

## 2019-08-23 DIAGNOSIS — G629 Polyneuropathy, unspecified: Secondary | ICD-10-CM | POA: Diagnosis not present

## 2019-08-23 DIAGNOSIS — C787 Secondary malignant neoplasm of liver and intrahepatic bile duct: Secondary | ICD-10-CM

## 2019-08-23 DIAGNOSIS — Z79899 Other long term (current) drug therapy: Secondary | ICD-10-CM | POA: Diagnosis not present

## 2019-08-23 DIAGNOSIS — C189 Malignant neoplasm of colon, unspecified: Secondary | ICD-10-CM | POA: Insufficient documentation

## 2019-08-23 DIAGNOSIS — C786 Secondary malignant neoplasm of retroperitoneum and peritoneum: Secondary | ICD-10-CM | POA: Diagnosis not present

## 2019-08-23 DIAGNOSIS — Z5112 Encounter for antineoplastic immunotherapy: Secondary | ICD-10-CM | POA: Diagnosis not present

## 2019-08-23 DIAGNOSIS — K5730 Diverticulosis of large intestine without perforation or abscess without bleeding: Secondary | ICD-10-CM | POA: Diagnosis not present

## 2019-08-23 DIAGNOSIS — F419 Anxiety disorder, unspecified: Secondary | ICD-10-CM | POA: Diagnosis not present

## 2019-08-23 DIAGNOSIS — Z5111 Encounter for antineoplastic chemotherapy: Secondary | ICD-10-CM | POA: Diagnosis not present

## 2019-08-23 DIAGNOSIS — R110 Nausea: Secondary | ICD-10-CM | POA: Insufficient documentation

## 2019-08-23 DIAGNOSIS — Z95828 Presence of other vascular implants and grafts: Secondary | ICD-10-CM

## 2019-08-23 DIAGNOSIS — K769 Liver disease, unspecified: Secondary | ICD-10-CM

## 2019-08-23 DIAGNOSIS — K6389 Other specified diseases of intestine: Secondary | ICD-10-CM

## 2019-08-23 LAB — CBC WITH DIFFERENTIAL (CANCER CENTER ONLY)
Abs Immature Granulocytes: 0 10*3/uL (ref 0.00–0.07)
Basophils Absolute: 0 10*3/uL (ref 0.0–0.1)
Basophils Relative: 1 %
Eosinophils Absolute: 0.1 10*3/uL (ref 0.0–0.5)
Eosinophils Relative: 1 %
HCT: 38.8 % (ref 36.0–46.0)
Hemoglobin: 12.5 g/dL (ref 12.0–15.0)
Immature Granulocytes: 0 %
Lymphocytes Relative: 33 %
Lymphs Abs: 1.4 10*3/uL (ref 0.7–4.0)
MCH: 32.1 pg (ref 26.0–34.0)
MCHC: 32.2 g/dL (ref 30.0–36.0)
MCV: 99.5 fL (ref 80.0–100.0)
Monocytes Absolute: 0.5 10*3/uL (ref 0.1–1.0)
Monocytes Relative: 13 %
Neutro Abs: 2.2 10*3/uL (ref 1.7–7.7)
Neutrophils Relative %: 52 %
Platelet Count: 207 10*3/uL (ref 150–400)
RBC: 3.9 MIL/uL (ref 3.87–5.11)
RDW: 15.8 % — ABNORMAL HIGH (ref 11.5–15.5)
WBC Count: 4.2 10*3/uL (ref 4.0–10.5)
nRBC: 0 % (ref 0.0–0.2)

## 2019-08-23 LAB — CMP (CANCER CENTER ONLY)
ALT: 33 U/L (ref 0–44)
AST: 32 U/L (ref 15–41)
Albumin: 3.4 g/dL — ABNORMAL LOW (ref 3.5–5.0)
Alkaline Phosphatase: 94 U/L (ref 38–126)
Anion gap: 7 (ref 5–15)
BUN: 14 mg/dL (ref 8–23)
CO2: 27 mmol/L (ref 22–32)
Calcium: 9.2 mg/dL (ref 8.9–10.3)
Chloride: 108 mmol/L (ref 98–111)
Creatinine: 0.95 mg/dL (ref 0.44–1.00)
GFR, Est AFR Am: >60 mL/min (ref >60)
GFR, Estimated: >60 mL/min (ref >60)
Glucose, Bld: 113 mg/dL — ABNORMAL HIGH (ref 70–99)
Potassium: 3.9 mmol/L (ref 3.5–5.1)
Sodium: 142 mmol/L (ref 135–145)
Total Bilirubin: 0.6 mg/dL (ref 0.3–1.2)
Total Protein: 7.3 g/dL (ref 6.5–8.1)

## 2019-08-23 LAB — CEA (IN HOUSE-CHCC): CEA (CHCC-In House): 10.69 ng/mL — ABNORMAL HIGH (ref 0.00–5.00)

## 2019-08-23 MED ORDER — SODIUM CHLORIDE 0.9 % IV SOLN
2400.0000 mg/m2 | INTRAVENOUS | Status: DC
  Administered 2019-08-23: 4250 mg via INTRAVENOUS
  Filled 2019-08-23: qty 85

## 2019-08-23 MED ORDER — FLUOROURACIL CHEMO INJECTION 2.5 GM/50ML
400.0000 mg/m2 | Freq: Once | INTRAVENOUS | Status: AC
  Administered 2019-08-23: 700 mg via INTRAVENOUS
  Filled 2019-08-23: qty 14

## 2019-08-23 MED ORDER — SODIUM CHLORIDE 0.9 % IV SOLN
5.0000 mg/kg | Freq: Once | INTRAVENOUS | Status: AC
  Administered 2019-08-23: 300 mg via INTRAVENOUS
  Filled 2019-08-23: qty 12

## 2019-08-23 MED ORDER — SODIUM CHLORIDE 0.9% FLUSH
10.0000 mL | Freq: Once | INTRAVENOUS | Status: AC
  Administered 2019-08-23: 10 mL
  Filled 2019-08-23: qty 10

## 2019-08-23 MED ORDER — SODIUM CHLORIDE 0.9 % IV SOLN
Freq: Once | INTRAVENOUS | Status: AC
  Filled 2019-08-23: qty 250

## 2019-08-23 MED ORDER — LEUCOVORIN CALCIUM INJECTION 350 MG
400.0000 mg/m2 | Freq: Once | INTRAVENOUS | Status: AC
  Administered 2019-08-23: 712 mg via INTRAVENOUS
  Filled 2019-08-23: qty 35.6
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
## 2019-08-23 NOTE — Patient Instructions (Signed)
[facility] Discharge Instructions for Patients Receiving Chemotherapy  Today you received the following chemotherapy agents Bevacizumab, Leucovorin and Adrucil   To help prevent nausea and vomiting after your treatment, we encourage you to take your nausea medication as directed.    If you develop nausea and vomiting that is not controlled by your nausea medication, call the clinic.   BELOW ARE SYMPTOMS THAT SHOULD BE REPORTED IMMEDIATELY:  *FEVER GREATER THAN 100.5 F  *CHILLS WITH OR WITHOUT FEVER  NAUSEA AND VOMITING THAT IS NOT CONTROLLED WITH YOUR NAUSEA MEDICATION  *UNUSUAL SHORTNESS OF BREATH  *UNUSUAL BRUISING OR BLEEDING  TENDERNESS IN MOUTH AND THROAT WITH OR WITHOUT PRESENCE OF ULCERS  *URINARY PROBLEMS  *BOWEL PROBLEMS  UNUSUAL RASH Items with * indicate a potential emergency and should be followed up as soon as possible.  Feel free to call the clinic should you have any questions or concerns. The clinic phone number is [phone].  Please show the [facility] at check-in to the Emergency Department and triage nurse.

## 2019-08-24 ENCOUNTER — Telehealth: Payer: Self-pay | Admitting: Oncology

## 2019-08-24 NOTE — Telephone Encounter (Signed)
Scheduled appt per 3/3 los

## 2019-08-25 ENCOUNTER — Other Ambulatory Visit: Payer: Self-pay

## 2019-08-25 ENCOUNTER — Inpatient Hospital Stay: Payer: Medicare Other

## 2019-08-25 VITALS — BP 133/84 | HR 97 | Temp 97.1°F | Resp 18

## 2019-08-25 DIAGNOSIS — C189 Malignant neoplasm of colon, unspecified: Secondary | ICD-10-CM | POA: Diagnosis not present

## 2019-08-25 DIAGNOSIS — R110 Nausea: Secondary | ICD-10-CM | POA: Diagnosis not present

## 2019-08-25 DIAGNOSIS — Z5111 Encounter for antineoplastic chemotherapy: Secondary | ICD-10-CM | POA: Diagnosis not present

## 2019-08-25 DIAGNOSIS — C787 Secondary malignant neoplasm of liver and intrahepatic bile duct: Secondary | ICD-10-CM | POA: Diagnosis not present

## 2019-08-25 DIAGNOSIS — Z5112 Encounter for antineoplastic immunotherapy: Secondary | ICD-10-CM | POA: Diagnosis not present

## 2019-08-25 DIAGNOSIS — K769 Liver disease, unspecified: Secondary | ICD-10-CM

## 2019-08-25 DIAGNOSIS — C786 Secondary malignant neoplasm of retroperitoneum and peritoneum: Secondary | ICD-10-CM | POA: Diagnosis not present

## 2019-08-25 DIAGNOSIS — K6389 Other specified diseases of intestine: Secondary | ICD-10-CM

## 2019-08-25 MED ORDER — SODIUM CHLORIDE 0.9% FLUSH
10.0000 mL | INTRAVENOUS | Status: DC | PRN
  Administered 2019-08-25: 10 mL
  Filled 2019-08-25: qty 10

## 2019-08-25 MED ORDER — HEPARIN SOD (PORK) LOCK FLUSH 100 UNIT/ML IV SOLN
500.0000 [IU] | Freq: Once | INTRAVENOUS | Status: AC | PRN
  Administered 2019-08-25: 500 [IU]
  Filled 2019-08-25: qty 5

## 2019-08-27 ENCOUNTER — Emergency Department (HOSPITAL_COMMUNITY): Payer: Medicare Other

## 2019-08-27 ENCOUNTER — Emergency Department (HOSPITAL_COMMUNITY)
Admission: EM | Admit: 2019-08-27 | Discharge: 2019-08-27 | Disposition: A | Discharge status: Home / Self Care | Payer: Medicare Other | Attending: Emergency Medicine | Admitting: Emergency Medicine

## 2019-08-27 ENCOUNTER — Other Ambulatory Visit: Payer: Self-pay

## 2019-08-27 DIAGNOSIS — Z79899 Other long term (current) drug therapy: Secondary | ICD-10-CM | POA: Insufficient documentation

## 2019-08-27 DIAGNOSIS — C189 Malignant neoplasm of colon, unspecified: Secondary | ICD-10-CM | POA: Diagnosis not present

## 2019-08-27 DIAGNOSIS — Z87891 Personal history of nicotine dependence: Secondary | ICD-10-CM | POA: Diagnosis not present

## 2019-08-27 DIAGNOSIS — R1084 Generalized abdominal pain: Secondary | ICD-10-CM | POA: Insufficient documentation

## 2019-08-27 DIAGNOSIS — K5900 Constipation, unspecified: Secondary | ICD-10-CM | POA: Insufficient documentation

## 2019-08-27 DIAGNOSIS — K5730 Diverticulosis of large intestine without perforation or abscess without bleeding: Secondary | ICD-10-CM | POA: Diagnosis not present

## 2019-08-27 DIAGNOSIS — R110 Nausea: Secondary | ICD-10-CM | POA: Insufficient documentation

## 2019-08-27 LAB — CBC
HCT: 43.4 % (ref 36.0–46.0)
Hemoglobin: 14 g/dL (ref 12.0–15.0)
MCH: 32.4 pg (ref 26.0–34.0)
MCHC: 32.3 g/dL (ref 30.0–36.0)
MCV: 100.5 fL — ABNORMAL HIGH (ref 80.0–100.0)
Platelets: 232 10*3/uL (ref 150–400)
RBC: 4.32 MIL/uL (ref 3.87–5.11)
RDW: 15.2 % (ref 11.5–15.5)
WBC: 5.5 10*3/uL (ref 4.0–10.5)
nRBC: 0 % (ref 0.0–0.2)

## 2019-08-27 LAB — COMPREHENSIVE METABOLIC PANEL
ALT: 24 U/L (ref 0–44)
AST: 24 U/L (ref 15–41)
Albumin: 3.6 g/dL (ref 3.5–5.0)
Alkaline Phosphatase: 79 U/L (ref 38–126)
Anion gap: 13 (ref 5–15)
BUN: 20 mg/dL (ref 8–23)
CO2: 21 mmol/L — ABNORMAL LOW (ref 22–32)
Calcium: 9.8 mg/dL (ref 8.9–10.3)
Chloride: 101 mmol/L (ref 98–111)
Creatinine, Ser: 1.09 mg/dL — ABNORMAL HIGH (ref 0.44–1.00)
GFR calc Af Amer: 60 mL/min — ABNORMAL LOW (ref >60)
GFR calc non Af Amer: 52 mL/min — ABNORMAL LOW (ref >60)
Glucose, Bld: 94 mg/dL (ref 70–99)
Potassium: 4.2 mmol/L (ref 3.5–5.1)
Sodium: 135 mmol/L (ref 135–145)
Total Bilirubin: 1.3 mg/dL — ABNORMAL HIGH (ref 0.3–1.2)
Total Protein: 7.7 g/dL (ref 6.5–8.1)

## 2019-08-27 LAB — URINALYSIS, ROUTINE W REFLEX MICROSCOPIC
Bilirubin Urine: NEGATIVE
Glucose, UA: NEGATIVE mg/dL
Hgb urine dipstick: NEGATIVE
Ketones, ur: 5 mg/dL — AB
Nitrite: NEGATIVE
Protein, ur: 100 mg/dL — AB
Specific Gravity, Urine: 1.019 (ref 1.005–1.030)
pH: 5 (ref 5.0–8.0)

## 2019-08-27 LAB — LIPASE, BLOOD: Lipase: 25 U/L (ref 11–51)

## 2019-08-27 MED ORDER — MORPHINE SULFATE (PF) 4 MG/ML IV SOLN
4.0000 mg | Freq: Once | INTRAVENOUS | Status: AC
  Administered 2019-08-27: 4 mg via INTRAVENOUS
  Filled 2019-08-27: qty 1

## 2019-08-27 MED ORDER — HYDROCODONE-ACETAMINOPHEN 5-325 MG PO TABS
1.0000 | ORAL_TABLET | Freq: Once | ORAL | Status: AC
  Administered 2019-08-27: 1 via ORAL
  Filled 2019-08-27: qty 1

## 2019-08-27 MED ORDER — DOCUSATE SODIUM 100 MG PO CAPS: 100.0000 mg | ORAL_CAPSULE | Freq: Two times a day (BID) | ORAL | 0 refills | Status: DC

## 2019-08-27 MED ORDER — SODIUM CHLORIDE 0.9% FLUSH: 3.0000 mL | Freq: Once | INTRAVENOUS | Status: DC

## 2019-08-27 MED ORDER — POLYETHYLENE GLYCOL 3350 17 G PO PACK: 17.0000 g | PACK | Freq: Every day | ORAL | 0 refills | Status: AC

## 2019-08-27 MED ORDER — IOHEXOL 300 MG/ML  SOLN
100.0000 mL | Freq: Once | INTRAMUSCULAR | Status: AC | PRN
  Administered 2019-08-27: 100 mL via INTRAVENOUS

## 2019-08-27 MED ORDER — SODIUM CHLORIDE 0.9 % IV BOLUS
1000.0000 mL | Freq: Once | INTRAVENOUS | Status: AC
  Administered 2019-08-27: 1000 mL via INTRAVENOUS

## 2019-08-27 MED ORDER — ONDANSETRON HCL 4 MG/2ML IJ SOLN
4.0000 mg | Freq: Once | INTRAMUSCULAR | Status: AC
  Administered 2019-08-27: 4 mg via INTRAVENOUS
  Filled 2019-08-27: qty 2

## 2019-08-27 NOTE — ED Triage Notes (Signed)
Abdominal pain x 3 weeks that are shooting pain throughout the lower abdomen. Nausea, no vomiting. No fevers, no chills. Pt does have colon cancer that has moved into the lining of her stomach.

## 2019-08-27 NOTE — Discharge Instructions (Addendum)
Call Dr. Alen Blew on 3/8 to discuss your CT result findings.   If you develop worsening, continued, or recurrent abdominal pain, uncontrolled vomiting, fever, chest or back pain, or any other new/concerning symptoms then return to the ER for evaluation.

## 2019-08-27 NOTE — ED Notes (Signed)
Pt transported to CT ?
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
## 2019-08-28 ENCOUNTER — Encounter: Payer: Self-pay | Admitting: Nurse Practitioner

## 2019-08-28 ENCOUNTER — Ambulatory Visit (INDEPENDENT_AMBULATORY_CARE_PROVIDER_SITE_OTHER): Payer: Medicare Other | Admitting: Nurse Practitioner

## 2019-08-28 VITALS — BP 116/78 | HR 72 | Temp 97.6°F | Ht 63.0 in | Wt 133.2 lb

## 2019-08-28 DIAGNOSIS — C787 Secondary malignant neoplasm of liver and intrahepatic bile duct: Secondary | ICD-10-CM | POA: Diagnosis not present

## 2019-08-28 DIAGNOSIS — C189 Malignant neoplasm of colon, unspecified: Secondary | ICD-10-CM | POA: Diagnosis not present

## 2019-08-28 DIAGNOSIS — K5903 Drug induced constipation: Secondary | ICD-10-CM

## 2019-08-28 DIAGNOSIS — Z8679 Personal history of other diseases of the circulatory system: Secondary | ICD-10-CM | POA: Diagnosis not present

## 2019-08-28 DIAGNOSIS — R030 Elevated blood-pressure reading, without diagnosis of hypertension: Secondary | ICD-10-CM

## 2019-08-28 MED ORDER — SENNA 8.6 MG PO TABS: 1.0000 | ORAL_TABLET | Freq: Two times a day (BID) | ORAL | 1 refills | Status: DC

## 2019-08-28 NOTE — Patient Instructions (Signed)
   Check blood pressure daily for the next 2 weeks and let us know if you are having pain to determine if this elevation is related to pain  Take the new colace 100mg  in addition to the senna twice a day.   Increase water intake.

## 2019-09-06 ENCOUNTER — Inpatient Hospital Stay: Payer: Medicare Other

## 2019-09-06 ENCOUNTER — Other Ambulatory Visit: Payer: Self-pay

## 2019-09-06 ENCOUNTER — Inpatient Hospital Stay (HOSPITAL_BASED_OUTPATIENT_CLINIC_OR_DEPARTMENT_OTHER): Payer: Medicare Other | Admitting: Oncology

## 2019-09-06 ENCOUNTER — Other Ambulatory Visit: Payer: Self-pay | Admitting: Oncology

## 2019-09-06 VITALS — BP 143/93 | HR 86 | Temp 98.0°F | Resp 18 | Ht 63.0 in | Wt 131.9 lb

## 2019-09-06 DIAGNOSIS — C787 Secondary malignant neoplasm of liver and intrahepatic bile duct: Secondary | ICD-10-CM | POA: Diagnosis not present

## 2019-09-06 DIAGNOSIS — C189 Malignant neoplasm of colon, unspecified: Secondary | ICD-10-CM

## 2019-09-06 DIAGNOSIS — Z5111 Encounter for antineoplastic chemotherapy: Secondary | ICD-10-CM | POA: Diagnosis not present

## 2019-09-06 DIAGNOSIS — C786 Secondary malignant neoplasm of retroperitoneum and peritoneum: Secondary | ICD-10-CM | POA: Diagnosis not present

## 2019-09-06 DIAGNOSIS — Z7189 Other specified counseling: Secondary | ICD-10-CM | POA: Insufficient documentation

## 2019-09-06 DIAGNOSIS — K6389 Other specified diseases of intestine: Secondary | ICD-10-CM

## 2019-09-06 DIAGNOSIS — R110 Nausea: Secondary | ICD-10-CM | POA: Diagnosis not present

## 2019-09-06 DIAGNOSIS — Z5112 Encounter for antineoplastic immunotherapy: Secondary | ICD-10-CM | POA: Diagnosis not present

## 2019-09-06 DIAGNOSIS — K769 Liver disease, unspecified: Secondary | ICD-10-CM

## 2019-09-06 LAB — CBC WITH DIFFERENTIAL (CANCER CENTER ONLY)
Abs Immature Granulocytes: 0.01 10*3/uL (ref 0.00–0.07)
Basophils Absolute: 0 10*3/uL (ref 0.0–0.1)
Basophils Relative: 1 %
Eosinophils Absolute: 0.1 10*3/uL (ref 0.0–0.5)
Eosinophils Relative: 1 %
HCT: 36.7 % (ref 36.0–46.0)
Hemoglobin: 12 g/dL (ref 12.0–15.0)
Immature Granulocytes: 0 %
Lymphocytes Relative: 33 %
Lymphs Abs: 1.4 10*3/uL (ref 0.7–4.0)
MCH: 32.4 pg (ref 26.0–34.0)
MCHC: 32.7 g/dL (ref 30.0–36.0)
MCV: 99.2 fL (ref 80.0–100.0)
Monocytes Absolute: 0.5 10*3/uL (ref 0.1–1.0)
Monocytes Relative: 11 %
Neutro Abs: 2.4 10*3/uL (ref 1.7–7.7)
Neutrophils Relative %: 54 %
Platelet Count: 221 10*3/uL (ref 150–400)
RBC: 3.7 MIL/uL — ABNORMAL LOW (ref 3.87–5.11)
RDW: 15.7 % — ABNORMAL HIGH (ref 11.5–15.5)
WBC Count: 4.4 10*3/uL (ref 4.0–10.5)
nRBC: 0 % (ref 0.0–0.2)

## 2019-09-06 LAB — CMP (CANCER CENTER ONLY)
ALT: 34 U/L (ref 0–44)
AST: 28 U/L (ref 15–41)
Albumin: 3.3 g/dL — ABNORMAL LOW (ref 3.5–5.0)
Alkaline Phosphatase: 97 U/L (ref 38–126)
Anion gap: 8 (ref 5–15)
BUN: 11 mg/dL (ref 8–23)
CO2: 27 mmol/L (ref 22–32)
Calcium: 9.1 mg/dL (ref 8.9–10.3)
Chloride: 106 mmol/L (ref 98–111)
Creatinine: 0.97 mg/dL (ref 0.44–1.00)
GFR, Est AFR Am: >60 mL/min (ref >60)
GFR, Estimated: 60 mL/min — ABNORMAL LOW (ref >60)
Glucose, Bld: 99 mg/dL (ref 70–99)
Potassium: 4.3 mmol/L (ref 3.5–5.1)
Sodium: 141 mmol/L (ref 135–145)
Total Bilirubin: 0.5 mg/dL (ref 0.3–1.2)
Total Protein: 7 g/dL (ref 6.5–8.1)

## 2019-09-06 LAB — CEA (IN HOUSE-CHCC): CEA (CHCC-In House): 11.66 ng/mL — ABNORMAL HIGH (ref 0.00–5.00)

## 2019-09-06 MED ORDER — SODIUM CHLORIDE 0.9 % IV SOLN
180.0000 mg/m2 | Freq: Once | INTRAVENOUS | Status: AC
  Administered 2019-09-06: 12:00:00 300 mg via INTRAVENOUS
  Filled 2019-09-06: qty 15

## 2019-09-06 MED ORDER — TRAMADOL HCL 50 MG PO TABS: 50.0000 mg | ORAL_TABLET | Freq: Four times a day (QID) | ORAL | 0 refills | Status: DC | PRN

## 2019-09-06 MED ORDER — FLUOROURACIL CHEMO INJECTION 2.5 GM/50ML
400.0000 mg/m2 | Freq: Once | INTRAVENOUS | Status: AC
  Administered 2019-09-06: 14:00:00 650 mg via INTRAVENOUS
  Filled 2019-09-06: qty 13

## 2019-09-06 MED ORDER — PALONOSETRON HCL INJECTION 0.25 MG/5ML
0.2500 mg | Freq: Once | INTRAVENOUS | Status: AC
  Administered 2019-09-06: 0.25 mg via INTRAVENOUS

## 2019-09-06 MED ORDER — SODIUM CHLORIDE 0.9 % IV SOLN
400.0000 mg/m2 | Freq: Once | INTRAVENOUS | Status: AC
  Administered 2019-09-06: 656 mg via INTRAVENOUS
  Filled 2019-09-06: qty 32.8

## 2019-09-06 MED ORDER — DEXAMETHASONE SODIUM PHOSPHATE 10 MG/ML IJ SOLN
INTRAMUSCULAR | Status: AC
  Filled 2019-09-06: qty 1

## 2019-09-06 MED ORDER — SODIUM CHLORIDE 0.9 % IV SOLN
5.0000 mg/kg | Freq: Once | INTRAVENOUS | Status: AC
  Administered 2019-09-06: 300 mg via INTRAVENOUS
  Filled 2019-09-06: qty 12

## 2019-09-06 MED ORDER — ATROPINE SULFATE 1 MG/ML IJ SOLN
0.5000 mg | Freq: Once | INTRAMUSCULAR | Status: AC | PRN
  Administered 2019-09-06: 0.5 mg via INTRAVENOUS

## 2019-09-06 MED ORDER — LORAZEPAM 1 MG PO TABS: 1.0000 mg | ORAL_TABLET | Freq: Four times a day (QID) | ORAL | 0 refills | Status: AC | PRN

## 2019-09-06 MED ORDER — DEXAMETHASONE SODIUM PHOSPHATE 10 MG/ML IJ SOLN
10.0000 mg | Freq: Once | INTRAMUSCULAR | Status: AC
  Administered 2019-09-06: 11:00:00 10 mg via INTRAVENOUS

## 2019-09-06 MED ORDER — ATROPINE SULFATE 1 MG/ML IJ SOLN
INTRAMUSCULAR | Status: AC
  Filled 2019-09-06: qty 1

## 2019-09-06 MED ORDER — SODIUM CHLORIDE 0.9 % IV SOLN
Freq: Once | INTRAVENOUS | Status: AC
  Filled 2019-09-06: qty 250

## 2019-09-06 MED ORDER — PALONOSETRON HCL INJECTION 0.25 MG/5ML
INTRAVENOUS | Status: AC
  Filled 2019-09-06: qty 5

## 2019-09-06 MED ORDER — SODIUM CHLORIDE 0.9 % IV SOLN
2400.0000 mg/m2 | INTRAVENOUS | Status: DC
  Administered 2019-09-06: 3950 mg via INTRAVENOUS
  Filled 2019-09-06: qty 79

## 2019-09-06 MED ORDER — SODIUM CHLORIDE 0.9 % IV SOLN: 10.0000 mg | Freq: Once | INTRAVENOUS | Status: DC

## 2019-09-06 NOTE — Progress Notes (Signed)
Per Dr. Alen Blew, can proceed with treatment without urine protein today and elevated BP.

## 2019-09-06 NOTE — Progress Notes (Signed)
Hematology and Oncology Follow Up Visit  Margaret Elliott [id number] 1951-03-23    09/06/19   Principle Diagnosis: 69 year old woman with stage IV colon cancer diagnosed in 2014.  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  Prior Therapy:  She is status post laparoscopic laparotomy right hemicolectomy with ileocolonic anastomosis done on December 09, 2012.   FOLFOX and a Avastin chemotherapy started 01/18/2013. She is S/P 12 cycles completed in 05/2013.   She is a status post microwave ablation of metastatic lesion in the posterior segment of the right lobe of the liver completed on 09/28/2014 and repeated on 11/30/2014.  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  FOLFIRI and a Avastin salvage therapy started on 12/10/2015.  Therapy discontinued and maintained on 5-FU leucovorin with a Avastin only till March 2020.  FOLFIRI and a Avastin restarted on 10/05/2018.   She is status post 12 cycles of therapy.    Current therapy:    Maintenance 5-FU, leucovorin and a Avastin maintenance restarted on October 7 of 2020.  She is here for the next cycle of maintenance therapy.   Interim History:  Margaret Elliott presents today for a follow-up visit.  Since the last visit, she was seen in the emergency department for increased abdominal pain on March 7 of 2021.  CT scan at that time showed more prominent peritoneal involvement but no urgent obstruction.  Since that time, she does report increased abdominal pain specially at nighttime but manageable during the day.  She does report constipation which is manageable overall.  She still ambulates without any major difficulties but does use a cane.  She denies any falls or syncope.  Denies any nausea, vomiting or diarrhea.                    Medications: Unchanged on  review. Current Outpatient Prescriptions  Medication Sig Dispense Refill  . acetaminophen (TYLENOL) 500 MG tablet Take 500 mg by mouth every 6 (six) hours as needed for pain.      . diphenhydrAMINE (BENADRYL) 25 mg capsule Take 25-50 mg by mouth daily as needed for itching. For allergic reaction      . lidocaine-prilocaine (EMLA) cream Apply 1 application topically as needed. Apply approx 1/2 tsp to skin over port, prior to chemotherapy treatments  1 kit  3  . Multiple Vitamin (MULTIVITAMIN) tablet Take 1 tablet by mouth daily.      . ondansetron (ZOFRAN) 8 MG tablet Take 1 tablet (8 mg total) by mouth every 8 (eight) hours as needed for nausea.  30 tablet  1   No current facility-administered medications for this visit.     Allergies: No Known Allergies      Physical Exam:  Blood pressure (!) 143/93, pulse 86, temperature 98 F (36.7 C), temperature source Temporal, resp. rate 18, height '5\' 3"'$  (1.6 m), weight 131 lb 14.4 oz (59.8 kg), SpO2 100 %.    ECOG: 1   General appearance: Comfortable appearing without any discomfort Head: Normocephalic without any trauma Oropharynx: Mucous membranes are moist and pink without any thrush or ulcers. Eyes: Pupils are equal and round reactive to light. Lymph nodes: No cervical, supraclavicular, inguinal or axillary lymphadenopathy.   Heart:regular rate and rhythm.  S1 and S2 without leg edema. Lung: Clear without any rhonchi or wheezes.  No dullness to percussion.  Abdomin: Soft, nontender, nondistended with good bowel sounds.  No rebound or guarding.  No shifting dullness. Musculoskeletal: No joint deformity or effusion.  Full range of motion noted. Neurological: No deficits noted on motor, sensory and deep tendon reflex exam. Skin: No petechial rash or dryness.  Appeared moist.                     CBC    Component Value Date/Time   WBC 5.5 08/27/2019 0615   RBC 4.32 08/27/2019 0615   HGB 14.0 08/27/2019 0615   HGB  12.5 08/23/2019 0912   HGB 9.7 (L) 06/14/2019 1634   HGB 12.3 05/26/2017 0809   HCT 43.4 08/27/2019 0615   HCT 29.4 (L) 06/14/2019 1634   HCT 37.6 05/26/2017 0809   PLT 232 08/27/2019 0615   PLT 207 08/23/2019 0912   PLT 298 06/14/2019 1634   MCV 100.5 (H) 08/27/2019 0615   MCV 98 (H) 06/14/2019 1634   MCV 100.3 05/26/2017 0809   MCH 32.4 08/27/2019 0615   MCHC 32.3 08/27/2019 0615   RDW 15.2 08/27/2019 0615   RDW 15.7 (H) 06/14/2019 1634   RDW 17.4 (H) 05/26/2017 0809   LYMPHSABS 1.4 08/23/2019 0912   LYMPHSABS 1.2 06/14/2019 1634   LYMPHSABS 1.2 05/26/2017 0809   MONOABS 0.5 08/23/2019 0912   MONOABS 0.5 05/26/2017 0809   EOSABS 0.1 08/23/2019 0912   EOSABS 0.0 06/14/2019 1634   BASOSABS 0.0 08/23/2019 0912   BASOSABS 0.0 06/14/2019 1634   BASOSABS 0.0 05/26/2017 0809   Results for Margaret Elliott, Margaret Elliott (MRN [id number]) as of 09/06/2019 10:48  Ref. Range 08/09/2019 09:30 08/23/2019 09:12  [facility] ([facility]) Latest Ref Range: 0.00 - 5.00 ng/mL 8.37 (H) 10.69 (H)    IMPRESSION: 1. Progression of omental metastatic disease since the CT 3 months ago. Index omental masses are measured above. 2. No acute abnormalities otherwise involving the abdomen or pelvis. 3. Stable post ablation changes involving the POSTERIOR segment RIGHT lobe of liver without evidence of recurrent tumor at this location. 4. Sigmoid diverticulosis without evidence of acute diverticulitis. 5. Large stool burden in the mildly distended ascending colon and proximal transverse colon.        Impression and Plan:  69 year old woman with:   1.  Colon cancer diagnosed in 2014.  She presented with stage IV disease and hepatic metastasis and currently has peritoneal involvement.  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    CT scan obtained on March 7 of 2021 was personally reviewed which showed progression of disease with peritoneal enlargement.  The  natural course of this disease and treatment options were reviewed at this time.  Given her NRAS mutation and MSI stable disease her treatment options are limited.  Restarting of FOLFIRI and a Avastin would be 1 option versus regorafenib.  Complication associated with both options were reviewed at this time.  Complications with regorafenib that include hypertension, and foot syndrome and diarrhea were reviewed.  After discussion today, we opted to restart FOLFIRI and Avastin and potentially use regorafenib if she has no benefit clinically.  Complications include nausea, myelosuppression, neutropenia and sepsis were reiterated.  He is agreeable to proceed at this time.  2.  IV access: Port-A-Cath remains in use without any issues.   3. Neuropathy: No recent exacerbation noted.  4.  Abdominal pain: Related to her disease progression.  Prescription for tramadol will be made available to her.  She prefers  not to use hydrocodone because of constipation.  5.  Right femur fracture: Continues to recover in rehab successfully.  6.  Prognosis: Her disease is incurable but her performance status is excellent.  Therapy remains palliative however.  7.  Anxiety and nausea: Prescription for Ativan will be available to her.  8. Followup: In 2 weeks for a follow-up in the next cycle of therapy.   30 minutes were spent on this visit.  The time was dedicated to reviewing her recent imaging studies, laboratory data, treatment options and complications related to individual therapies.  Zola Button MD 09/06/19

## 2019-09-06 NOTE — Patient Instructions (Signed)
[facility] Discharge Instructions for Patients Receiving Chemotherapy  Today you received the following chemotherapy agents: bevacizumab, irinotecan, leucovorin, and 5FU.  To help prevent nausea and vomiting after your treatment, we encourage you to take your nausea medication as directed.   If you develop nausea and vomiting that is not controlled by your nausea medication, call the clinic.   BELOW ARE SYMPTOMS THAT SHOULD BE REPORTED IMMEDIATELY:  *FEVER GREATER THAN 100.5 F  *CHILLS WITH OR WITHOUT FEVER  NAUSEA AND VOMITING THAT IS NOT CONTROLLED WITH YOUR NAUSEA MEDICATION  *UNUSUAL SHORTNESS OF BREATH  *UNUSUAL BRUISING OR BLEEDING  TENDERNESS IN MOUTH AND THROAT WITH OR WITHOUT PRESENCE OF ULCERS  *URINARY PROBLEMS  *BOWEL PROBLEMS  UNUSUAL RASH Items with * indicate a potential emergency and should be followed up as soon as possible.  Feel free to call the clinic should you have any questions or concerns. The clinic phone number is [phone].  Please show the [place] at check-in to the Emergency Department and triage nurse.

## 2019-09-06 NOTE — Progress Notes (Signed)
DISCONTINUE ON PATHWAY REGIMEN - Colorectal     A cycle is every 14 days:     Irinotecan      Leucovorin      5-Fluorouracil      5-Fluorouracil      Bevacizumab-xxxx   **Always confirm dose/schedule in your pharmacy ordering system**  REASON: Disease Progression PRIOR TREATMENT: [id number]: FOLFIRI + Bevacizumab q14 Days TREATMENT RESPONSE: Partial Response (PR)  START ON PATHWAY REGIMEN - Colorectal     A cycle is every 14 days:     Bevacizumab-xxxx      Irinotecan      Leucovorin      Fluorouracil      Fluorouracil   **Always confirm dose/schedule in your pharmacy ordering system**  Patient Characteristics: Distant Metastases, Nonsurgical Candidate, KRAS/NRAS Mutation Positive/Unknown (BRAF V600 Wild-Type/Unknown), Standard Cytotoxic Therapy, Second Line Standard Cytotoxic Therapy, Bevacizumab Eligible Tumor Location: Colon Therapeutic Status: Distant Metastases Microsatellite/Mismatch Repair Status: Unknown BRAF Mutation Status: Wild-Type (no mutation) KRAS/NRAS Mutation Status: Mutation Positive Standard Cytotoxic Line of Therapy: Second Line Standard Cytotoxic Therapy Bevacizumab Eligibility: Eligible Intent of Therapy: Non-Curative / Palliative Intent, Discussed with Patient 

## 2019-09-07 ENCOUNTER — Telehealth: Payer: Self-pay | Admitting: Oncology

## 2019-09-07 NOTE — Telephone Encounter (Signed)
Scheduled appt per 3/17 los. °

## 2019-09-08 ENCOUNTER — Inpatient Hospital Stay: Payer: Medicare Other

## 2019-09-08 ENCOUNTER — Other Ambulatory Visit: Payer: Self-pay

## 2019-09-08 VITALS — BP 128/72 | HR 78 | Temp 98.2°F | Resp 18

## 2019-09-08 DIAGNOSIS — K769 Liver disease, unspecified: Secondary | ICD-10-CM

## 2019-09-08 DIAGNOSIS — C189 Malignant neoplasm of colon, unspecified: Secondary | ICD-10-CM

## 2019-09-08 DIAGNOSIS — K6389 Other specified diseases of intestine: Secondary | ICD-10-CM

## 2019-09-08 DIAGNOSIS — R110 Nausea: Secondary | ICD-10-CM | POA: Diagnosis not present

## 2019-09-08 DIAGNOSIS — C786 Secondary malignant neoplasm of retroperitoneum and peritoneum: Secondary | ICD-10-CM | POA: Diagnosis not present

## 2019-09-08 DIAGNOSIS — Z5111 Encounter for antineoplastic chemotherapy: Secondary | ICD-10-CM | POA: Diagnosis not present

## 2019-09-08 DIAGNOSIS — C787 Secondary malignant neoplasm of liver and intrahepatic bile duct: Secondary | ICD-10-CM | POA: Diagnosis not present

## 2019-09-08 DIAGNOSIS — Z5112 Encounter for antineoplastic immunotherapy: Secondary | ICD-10-CM | POA: Diagnosis not present

## 2019-09-08 MED ORDER — HEPARIN SOD (PORK) LOCK FLUSH 100 UNIT/ML IV SOLN
500.0000 [IU] | Freq: Once | INTRAVENOUS | Status: AC | PRN
  Administered 2019-09-08: 13:00:00 500 [IU]
  Filled 2019-09-08: qty 5

## 2019-09-08 MED ORDER — SODIUM CHLORIDE 0.9% FLUSH
10.0000 mL | INTRAVENOUS | Status: DC | PRN
  Administered 2019-09-08: 10 mL
  Filled 2019-09-08: qty 10

## 2019-09-08 MED ORDER — PEGFILGRASTIM-CBQV 6 MG/0.6ML ~~LOC~~ SOSY
PREFILLED_SYRINGE | SUBCUTANEOUS | Status: AC
  Filled 2019-09-08: qty 0.6

## 2019-09-08 MED ORDER — PEGFILGRASTIM-CBQV 6 MG/0.6ML ~~LOC~~ SOSY
6.0000 mg | PREFILLED_SYRINGE | Freq: Once | SUBCUTANEOUS | Status: AC
  Administered 2019-09-08: 13:00:00 6 mg via SUBCUTANEOUS

## 2019-09-14 NOTE — Progress Notes (Signed)
Pharmacist Chemotherapy Monitoring - Follow Up Assessment    I verify that I have reviewed each item in the below checklist:  . Regimen for the patient is scheduled for the appropriate day and plan matches scheduled date. Marland Kitchen Appropriate non-routine labs are ordered dependent on drug ordered. . If applicable, additional medications reviewed and ordered per protocol based on lifetime cumulative doses and/or treatment regimen.   Plan for follow-up and/or issues identified: No . I-vent associated with next due treatment: No    Kennith Center, Pharm.D., CPP 09/14/2019@4 :06 PM

## 2019-09-20 ENCOUNTER — Other Ambulatory Visit: Payer: Self-pay

## 2019-09-20 ENCOUNTER — Inpatient Hospital Stay (HOSPITAL_BASED_OUTPATIENT_CLINIC_OR_DEPARTMENT_OTHER): Payer: Medicare Other | Admitting: Oncology

## 2019-09-20 ENCOUNTER — Inpatient Hospital Stay: Payer: Medicare Other

## 2019-09-20 VITALS — BP 141/95 | HR 84 | Temp 98.2°F | Resp 18 | Ht 63.0 in | Wt 129.6 lb

## 2019-09-20 DIAGNOSIS — C189 Malignant neoplasm of colon, unspecified: Secondary | ICD-10-CM

## 2019-09-20 DIAGNOSIS — C786 Secondary malignant neoplasm of retroperitoneum and peritoneum: Secondary | ICD-10-CM | POA: Diagnosis not present

## 2019-09-20 DIAGNOSIS — K6389 Other specified diseases of intestine: Secondary | ICD-10-CM

## 2019-09-20 DIAGNOSIS — K769 Liver disease, unspecified: Secondary | ICD-10-CM

## 2019-09-20 DIAGNOSIS — C787 Secondary malignant neoplasm of liver and intrahepatic bile duct: Secondary | ICD-10-CM | POA: Diagnosis not present

## 2019-09-20 DIAGNOSIS — Z5111 Encounter for antineoplastic chemotherapy: Secondary | ICD-10-CM | POA: Diagnosis not present

## 2019-09-20 DIAGNOSIS — Z5112 Encounter for antineoplastic immunotherapy: Secondary | ICD-10-CM | POA: Diagnosis not present

## 2019-09-20 DIAGNOSIS — R110 Nausea: Secondary | ICD-10-CM | POA: Diagnosis not present

## 2019-09-20 DIAGNOSIS — Z95828 Presence of other vascular implants and grafts: Secondary | ICD-10-CM

## 2019-09-20 LAB — CMP (CANCER CENTER ONLY)
ALT: 17 U/L (ref 0–44)
AST: 21 U/L (ref 15–41)
Albumin: 3.3 g/dL — ABNORMAL LOW (ref 3.5–5.0)
Alkaline Phosphatase: 121 U/L (ref 38–126)
Anion gap: 7 (ref 5–15)
BUN: 8 mg/dL (ref 8–23)
CO2: 27 mmol/L (ref 22–32)
Calcium: 9.4 mg/dL (ref 8.9–10.3)
Chloride: 108 mmol/L (ref 98–111)
Creatinine: 0.9 mg/dL (ref 0.44–1.00)
GFR, Est AFR Am: >60 mL/min (ref >60)
GFR, Estimated: >60 mL/min (ref >60)
Glucose, Bld: 91 mg/dL (ref 70–99)
Potassium: 3.9 mmol/L (ref 3.5–5.1)
Sodium: 142 mmol/L (ref 135–145)
Total Bilirubin: 0.4 mg/dL (ref 0.3–1.2)
Total Protein: 7.1 g/dL (ref 6.5–8.1)

## 2019-09-20 LAB — CBC WITH DIFFERENTIAL (CANCER CENTER ONLY)
Abs Immature Granulocytes: 0.05 10*3/uL (ref 0.00–0.07)
Basophils Absolute: 0 10*3/uL (ref 0.0–0.1)
Basophils Relative: 0 %
Eosinophils Absolute: 0.1 10*3/uL (ref 0.0–0.5)
Eosinophils Relative: 1 %
HCT: 36.9 % (ref 36.0–46.0)
Hemoglobin: 12.2 g/dL (ref 12.0–15.0)
Immature Granulocytes: 1 %
Lymphocytes Relative: 18 %
Lymphs Abs: 1.6 10*3/uL (ref 0.7–4.0)
MCH: 33 pg (ref 26.0–34.0)
MCHC: 33.1 g/dL (ref 30.0–36.0)
MCV: 99.7 fL (ref 80.0–100.0)
Monocytes Absolute: 0.6 10*3/uL (ref 0.1–1.0)
Monocytes Relative: 6 %
Neutro Abs: 6.5 10*3/uL (ref 1.7–7.7)
Neutrophils Relative %: 74 %
Platelet Count: 200 10*3/uL (ref 150–400)
RBC: 3.7 MIL/uL — ABNORMAL LOW (ref 3.87–5.11)
RDW: 16.4 % — ABNORMAL HIGH (ref 11.5–15.5)
WBC Count: 8.8 10*3/uL (ref 4.0–10.5)
nRBC: 0 % (ref 0.0–0.2)

## 2019-09-20 LAB — CEA (IN HOUSE-CHCC): CEA (CHCC-In House): 13.94 ng/mL — ABNORMAL HIGH (ref 0.00–5.00)

## 2019-09-20 MED ORDER — DEXAMETHASONE SODIUM PHOSPHATE 10 MG/ML IJ SOLN
INTRAMUSCULAR | Status: AC
  Filled 2019-09-20: qty 1

## 2019-09-20 MED ORDER — PALONOSETRON HCL INJECTION 0.25 MG/5ML
0.2500 mg | Freq: Once | INTRAVENOUS | Status: AC
  Administered 2019-09-20: 10:00:00 0.25 mg via INTRAVENOUS

## 2019-09-20 MED ORDER — PROCHLORPERAZINE MALEATE 10 MG PO TABS: 10.0000 mg | ORAL_TABLET | Freq: Four times a day (QID) | ORAL | 2 refills | Status: AC | PRN

## 2019-09-20 MED ORDER — SODIUM CHLORIDE 0.9 % IV SOLN
5.0000 mg/kg | Freq: Once | INTRAVENOUS | Status: AC
  Administered 2019-09-20: 300 mg via INTRAVENOUS
  Filled 2019-09-20: qty 12

## 2019-09-20 MED ORDER — SODIUM CHLORIDE 0.9% FLUSH
10.0000 mL | INTRAVENOUS | Status: DC | PRN
  Filled 2019-09-20: qty 10

## 2019-09-20 MED ORDER — FLUOROURACIL CHEMO INJECTION 2.5 GM/50ML
400.0000 mg/m2 | Freq: Once | INTRAVENOUS | Status: AC
  Administered 2019-09-20: 12:00:00 650 mg via INTRAVENOUS
  Filled 2019-09-20: qty 13

## 2019-09-20 MED ORDER — PALONOSETRON HCL INJECTION 0.25 MG/5ML
INTRAVENOUS | Status: AC
  Filled 2019-09-20: qty 5

## 2019-09-20 MED ORDER — ATROPINE SULFATE 1 MG/ML IJ SOLN
0.5000 mg | Freq: Once | INTRAMUSCULAR | Status: AC | PRN
  Administered 2019-09-20: 0.5 mg via INTRAVENOUS

## 2019-09-20 MED ORDER — ATROPINE SULFATE 1 MG/ML IJ SOLN
INTRAMUSCULAR | Status: AC
  Filled 2019-09-20: qty 1

## 2019-09-20 MED ORDER — SODIUM CHLORIDE 0.9 % IV SOLN
2400.0000 mg/m2 | INTRAVENOUS | Status: DC
  Administered 2019-09-20: 13:00:00 3950 mg via INTRAVENOUS
  Filled 2019-09-20: qty 79

## 2019-09-20 MED ORDER — DEXAMETHASONE SODIUM PHOSPHATE 10 MG/ML IJ SOLN
10.0000 mg | Freq: Once | INTRAMUSCULAR | Status: AC
  Administered 2019-09-20: 10:00:00 10 mg via INTRAVENOUS

## 2019-09-20 MED ORDER — SODIUM CHLORIDE 0.9 % IV SOLN
180.0000 mg/m2 | Freq: Once | INTRAVENOUS | Status: AC
  Administered 2019-09-20: 300 mg via INTRAVENOUS
  Filled 2019-09-20: qty 15

## 2019-09-20 MED ORDER — HEPARIN SOD (PORK) LOCK FLUSH 100 UNIT/ML IV SOLN
500.0000 [IU] | Freq: Once | INTRAVENOUS | Status: DC | PRN
  Filled 2019-09-20: qty 5

## 2019-09-20 MED ORDER — SODIUM CHLORIDE 0.9% FLUSH
10.0000 mL | Freq: Once | INTRAVENOUS | Status: AC
  Administered 2019-09-20: 10 mL
  Filled 2019-09-20: qty 10

## 2019-09-20 MED ORDER — SODIUM CHLORIDE 0.9 % IV SOLN
Freq: Once | INTRAVENOUS | Status: AC
  Filled 2019-09-20: qty 250

## 2019-09-20 MED ORDER — SODIUM CHLORIDE 0.9 % IV SOLN
400.0000 mg/m2 | Freq: Once | INTRAVENOUS | Status: AC
  Administered 2019-09-20: 656 mg via INTRAVENOUS
  Filled 2019-09-20: qty 32.8

## 2019-09-20 NOTE — Progress Notes (Signed)
Hematology and Oncology Follow Up Visit  Margaret Elliott [id number] 10-Feb-1951    09/20/19   Principle Diagnosis: 69 year old woman with colon cancer diagnosed in 2014.  She presented with stage IV and hepatic metastasis and subsequently relapsed in 2017 with peritoneal disease.  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  Prior Therapy:  She is status post laparoscopic laparotomy right hemicolectomy with ileocolonic anastomosis done on December 09, 2012.   FOLFOX and a Avastin chemotherapy started 01/18/2013. She is S/P 12 cycles completed in 05/2013.   She is a status post microwave ablation of metastatic lesion in the posterior segment of the right lobe of the liver completed on 09/28/2014 and repeated on 11/30/2014.  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  FOLFIRI and a Avastin salvage therapy started on 12/10/2015.  Therapy discontinued and maintained on 5-FU leucovorin with a Avastin only till March 2020.  FOLFIRI and a Avastin restarted on 10/05/2018.   She is status post 12 cycles of therapy.    Maintenance 5-FU, leucovorin and a Avastin maintenance restarted on October 7 of 2020.    Current therapy:    FOLFIRI and a Avastin restarted on September 06, 2019.  Is here for cycle 2 of therapy.   Interim History:  Margaret Elliott returns today for a repeat evaluation.  Since the last visit, she received the first cycle of FOLFIRI and Avastin retreatment without any major complications.  He denied any worsening abdominal pain or vomiting.  She does report some mild nausea that is manageable at this time.  She denies any excessive fatigue tiredness or falls.  She does ambulate with the help of cane.  Her appetite has decreased and lost a few pounds since last visit.                   Medications: Reviewed without changes. Current Outpatient Prescriptions   Medication Sig Dispense Refill  . acetaminophen (TYLENOL) 500 MG tablet Take 500 mg by mouth every 6 (six) hours as needed for pain.      . diphenhydrAMINE (BENADRYL) 25 mg capsule Take 25-50 mg by mouth daily as needed for itching. For allergic reaction      . lidocaine-prilocaine (EMLA) cream Apply 1 application topically as needed. Apply approx 1/2 tsp to skin over port, prior to chemotherapy treatments  1 kit  3  . Multiple Vitamin (MULTIVITAMIN) tablet Take 1 tablet by mouth daily.      . ondansetron (ZOFRAN) 8 MG tablet Take 1 tablet (8 mg total) by mouth every 8 (eight) hours as needed for nausea.  30 tablet  1   No current facility-administered medications for this visit.     Allergies: No Known Allergies      Physical Exam:  Blood pressure (!) 141/95, pulse 84, temperature 98.2 F (36.8 C), temperature source Oral, resp. rate 18, height '5\' 3"'$  (1.6 m), weight 129 lb 9.6 oz (58.8 kg), SpO2 100 %.     ECOG: 1    General appearance: Alert, awake without any distress. Head: Atraumatic without abnormalities Oropharynx: Without any thrush or ulcers. Eyes: No scleral icterus. Lymph nodes: No lymphadenopathy noted in the cervical, supraclavicular, or axillary nodes Heart:regular rate and rhythm, without any murmurs or gallops.   Lung: Clear to auscultation without any rhonchi, wheezes or dullness to percussion. Abdomin: Soft, nontender without any shifting dullness or ascites. Musculoskeletal: No clubbing or  cyanosis. Neurological: No motor or sensory deficits. Skin: No rashes or lesions.                    CBC    Component Value Date/Time   WBC 4.4 09/06/2019 1000   WBC 5.5 08/27/2019 0615   RBC 3.70 (L) 09/06/2019 1000   HGB 12.0 09/06/2019 1000   HGB 9.7 (L) 06/14/2019 1634   HGB 12.3 05/26/2017 0809   HCT 36.7 09/06/2019 1000   HCT 29.4 (L) 06/14/2019 1634   HCT 37.6 05/26/2017 0809   PLT 221 09/06/2019 1000   PLT 298 06/14/2019 1634    MCV 99.2 09/06/2019 1000   MCV 98 (H) 06/14/2019 1634   MCV 100.3 05/26/2017 0809   MCH 32.4 09/06/2019 1000   MCHC 32.7 09/06/2019 1000   RDW 15.7 (H) 09/06/2019 1000   RDW 15.7 (H) 06/14/2019 1634   RDW 17.4 (H) 05/26/2017 0809   LYMPHSABS 1.4 09/06/2019 1000   LYMPHSABS 1.2 06/14/2019 1634   LYMPHSABS 1.2 05/26/2017 0809   MONOABS 0.5 09/06/2019 1000   MONOABS 0.5 05/26/2017 0809   EOSABS 0.1 09/06/2019 1000   EOSABS 0.0 06/14/2019 1634   BASOSABS 0.0 09/06/2019 1000   BASOSABS 0.0 06/14/2019 1634   BASOSABS 0.0 05/26/2017 0809      Results for Margaret Elliott, Margaret Elliott (MRN [id number]) as of 09/20/2019 08:26  Ref. Range 08/23/2019 09:12 09/06/2019 10:00  CEA ([facility]) Latest Ref Range: 0.00 - 5.00 ng/mL 10.69 (H) 11.66 (H)     Impression and Plan:  69 year old woman with:   1.  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   She is currently receiving salvage therapy with FOLFIRI and a Avastin and here for the second cycle.  Risks and benefits of resuming this treatment further were discussed.  Complications that include nausea, vomiting, myelosuppression and infusion related complications were discussed.  Alternative options would be regorafenib as well as experimental treatment.  She Is agreeable to proceed with this treatment and we will continue to monitor her clinical status and tumor markers.  We will repeat imaging studies in 2 months.   2.  IV access: Port-A-Cath currently accessed without any issues.   3. 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  4.  Abdominal pain: Manageable at this time without any recent exacerbation.  She is moving her bowels better.  5.  Nausea: Prescription for Compazine was made available to her.  She is getting Aloxi with treatment and not can use Zofran after 3  days of treatment.  6.  Prognosis: Therapy remains palliative at this time although aggressive measures are warranted given her excellent performance status.  7.  Anxiety: Currently on Ativan was not improvement in her anxiety.  8.  Weight loss: We discussed strategies to boost her nutritional intake including nutritional supplements and potentially Megace in the future.  9. Followup: She will return in 2 weeks for the next cycle of therapy.   30 minutes were dedicated to this encounter.  The time was spent on reviewing laboratory data, addressing complications to her cancer and cancer therapy.  Alternative treatment options were also reviewed.  Zola Button MD 09/20/19

## 2019-09-20 NOTE — Patient Instructions (Signed)
[facility] Discharge Instructions for Patients Receiving Chemotherapy  Today you received the following chemotherapy agents: bevacizumab/irinotecan/leucovorin/fluorouracil.  To help prevent nausea and vomiting after your treatment, we encourage you to take your nausea medication as directed.   If you develop nausea and vomiting that is not controlled by your nausea medication, call the clinic.   BELOW ARE SYMPTOMS THAT SHOULD BE REPORTED IMMEDIATELY:  *FEVER GREATER THAN 100.5 F  *CHILLS WITH OR WITHOUT FEVER  NAUSEA AND VOMITING THAT IS NOT CONTROLLED WITH YOUR NAUSEA MEDICATION  *UNUSUAL SHORTNESS OF BREATH  *UNUSUAL BRUISING OR BLEEDING  TENDERNESS IN MOUTH AND THROAT WITH OR WITHOUT PRESENCE OF ULCERS  *URINARY PROBLEMS  *BOWEL PROBLEMS  UNUSUAL RASH Items with * indicate a potential emergency and should be followed up as soon as possible.  Feel free to call the clinic should you have any questions or concerns. The clinic phone number is [phone].  Please show the CHEMO ALERT CARD at check-in to the Emergency Department and triage nurse.   

## 2019-09-21 ENCOUNTER — Telehealth: Payer: Self-pay | Admitting: Oncology

## 2019-09-21 NOTE — Telephone Encounter (Signed)
Scheduled appt per 3/31 los 

## 2019-09-22 ENCOUNTER — Other Ambulatory Visit: Payer: Self-pay

## 2019-09-22 ENCOUNTER — Inpatient Hospital Stay: Payer: Medicare Other | Attending: Oncology

## 2019-09-22 VITALS — BP 120/80 | HR 70 | Temp 98.3°F | Resp 19

## 2019-09-22 DIAGNOSIS — C189 Malignant neoplasm of colon, unspecified: Secondary | ICD-10-CM | POA: Diagnosis not present

## 2019-09-22 DIAGNOSIS — Z5112 Encounter for antineoplastic immunotherapy: Secondary | ICD-10-CM | POA: Diagnosis not present

## 2019-09-22 DIAGNOSIS — R109 Unspecified abdominal pain: Secondary | ICD-10-CM | POA: Diagnosis not present

## 2019-09-22 DIAGNOSIS — Z7689 Persons encountering health services in other specified circumstances: Secondary | ICD-10-CM | POA: Insufficient documentation

## 2019-09-22 DIAGNOSIS — R5383 Other fatigue: Secondary | ICD-10-CM | POA: Insufficient documentation

## 2019-09-22 DIAGNOSIS — G629 Polyneuropathy, unspecified: Secondary | ICD-10-CM | POA: Insufficient documentation

## 2019-09-22 DIAGNOSIS — Z5111 Encounter for antineoplastic chemotherapy: Secondary | ICD-10-CM | POA: Insufficient documentation

## 2019-09-22 DIAGNOSIS — K6389 Other specified diseases of intestine: Secondary | ICD-10-CM

## 2019-09-22 DIAGNOSIS — F419 Anxiety disorder, unspecified: Secondary | ICD-10-CM | POA: Insufficient documentation

## 2019-09-22 DIAGNOSIS — R110 Nausea: Secondary | ICD-10-CM | POA: Diagnosis not present

## 2019-09-22 DIAGNOSIS — C786 Secondary malignant neoplasm of retroperitoneum and peritoneum: Secondary | ICD-10-CM | POA: Insufficient documentation

## 2019-09-22 DIAGNOSIS — R634 Abnormal weight loss: Secondary | ICD-10-CM | POA: Diagnosis not present

## 2019-09-22 DIAGNOSIS — C787 Secondary malignant neoplasm of liver and intrahepatic bile duct: Secondary | ICD-10-CM | POA: Diagnosis not present

## 2019-09-22 DIAGNOSIS — K769 Liver disease, unspecified: Secondary | ICD-10-CM

## 2019-09-22 DIAGNOSIS — Z79899 Other long term (current) drug therapy: Secondary | ICD-10-CM | POA: Diagnosis not present

## 2019-09-22 MED ORDER — PEGFILGRASTIM-CBQV 6 MG/0.6ML ~~LOC~~ SOSY
6.0000 mg | PREFILLED_SYRINGE | Freq: Once | SUBCUTANEOUS | Status: AC
  Administered 2019-09-22: 11:00:00 6 mg via SUBCUTANEOUS

## 2019-09-22 MED ORDER — SODIUM CHLORIDE 0.9% FLUSH
10.0000 mL | INTRAVENOUS | Status: DC | PRN
  Administered 2019-09-22: 10 mL
  Filled 2019-09-22: qty 10

## 2019-09-22 MED ORDER — PEGFILGRASTIM-CBQV 6 MG/0.6ML ~~LOC~~ SOSY
PREFILLED_SYRINGE | SUBCUTANEOUS | Status: AC
  Filled 2019-09-22: qty 0.6

## 2019-09-22 MED ORDER — HEPARIN SOD (PORK) LOCK FLUSH 100 UNIT/ML IV SOLN
500.0000 [IU] | Freq: Once | INTRAVENOUS | Status: AC | PRN
  Administered 2019-09-22: 500 [IU]
  Filled 2019-09-22: qty 5

## 2019-09-22 NOTE — Patient Instructions (Signed)

## 2019-09-28 NOTE — Progress Notes (Signed)
This visit occurred during the SARS-CoV-2 public health emergency.  Safety protocols were in place, including screening questions prior to the visit, additional usage of staff PPE, and extensive cleaning of exam room while observing appropriate contact time as indicated for disinfecting solutions.  Subjective:     Patient ID: Margaret Elliott , female    DOB: November 26, 1950 , 69 y.o.   MRN: [id number]   Chief Complaint  Patient presents with  . Establish Care  . Abdominal Pain    patient went to the ER yesterday for abdominal pain and her bp was elvated when she was there    HPI  HPI   Past Medical History:  Diagnosis Date  . Anemia   . Closed right hip fracture ([place]) 06/06/2019  . colon ca dx'd 11/2012  . History of chemotherapy      Family History  Problem Relation Age of Onset  . Heart disease Mother   . COPD Mother      Current Outpatient Medications:  .  acetaminophen (TYLENOL) 500 MG tablet, Take 500 mg by mouth every 6 (six) hours as needed for pain., Disp: , Rfl:  .  calcium-vitamin D (OSCAL WITH D) 500-200 MG-UNIT tablet, Take 1 tablet by mouth daily with breakfast., Disp: 30 tablet, Rfl: 0 .  docusate sodium (COLACE) 100 MG capsule, Take 1 capsule (100 mg total) by mouth every 12 (twelve) hours. (Patient not taking: Reported on 09/06/2019), Disp: 60 capsule, Rfl: 0 .  HYDROcodone-acetaminophen (NORCO) 5-325 MG tablet, Take 1 tablet by mouth every 6 (six) hours as needed for moderate pain., Disp: 30 tablet, Rfl: 0 .  lidocaine (XYLOCAINE) 2 % solution, Use as directed 15 mLs in the mouth or throat as needed for mouth pain., Disp: 100 mL, Rfl: 1 .  lidocaine-prilocaine (EMLA) cream, Apply 1 application topically as needed. Apply approx 1/2 tsp to skin over port, prior to chemotherapy treatments, Disp: 1 each, Rfl: 3 .  Multiple Vitamin (MULTIVITAMIN ADULT PO), Take 1 tablet by mouth daily., Disp: , Rfl:  .  ondansetron (ZOFRAN) 8 MG tablet, TAKE 1 TABLET BY MOUTH 3 TIMES A  DAY AS NEEDED FOR NAUSEA (Patient taking differently: Take 8 mg by mouth every 8 (eight) hours as needed for nausea. ), Disp: 30 tablet, Rfl: 0 .  polyethylene glycol (MIRALAX / GLYCOLAX) 17 g packet, Take 17 g by mouth daily., Disp: 14 each, Rfl: 0 .  senna-docusate (SENOKOT-S) 8.6-50 MG tablet, Take 1 tablet by mouth 2 (two) times daily., Disp: 60 tablet, Rfl: 1 .  LORazepam (ATIVAN) 1 MG tablet, Take 1 tablet (1 mg total) by mouth every 6 (six) hours as needed for sleep., Disp: 60 tablet, Rfl: 0 .  prochlorperazine (COMPAZINE) 10 MG tablet, Take 1 tablet (10 mg total) by mouth every 6 (six) hours as needed for nausea or vomiting., Disp: 30 tablet, Rfl: 2 .  senna (SENOKOT) 8.6 MG TABS tablet, Take 1 tablet (8.6 mg total) by mouth 2 (two) times daily. (Patient not taking: Reported on 09/06/2019), Disp: 120 tablet, Rfl: 1 .  traMADol (ULTRAM) 50 MG tablet, Take 1 tablet (50 mg total) by mouth every 6 (six) hours as needed., Disp: 30 tablet, Rfl: 0 No current facility-administered medications for this visit.  Facility-Administered Medications Ordered in Other Visits:  .  sodium chloride 0.9 % injection 10 mL, 10 mL, Intravenous, PRN, Wyatt Portela, MD, 10 mL at 10/07/16 [id number]   No Known Allergies   Review of Systems   Today's  Vitals   08/28/19 1441  BP: 116/78  Pulse: 72  Temp: 97.6 F (36.4 C)  TempSrc: Oral  Weight: 133 lb 3.2 oz (60.4 kg)  Height: 5\' 3"  (1.6 m)  PainSc: 0-No pain   Body mass index is 23.6 kg/m.   Objective:  Physical Exam Vitals reviewed.  Constitutional:      General: She is not in acute distress.    Appearance: She is well-developed.  HENT:     Head: Normocephalic and atraumatic.  Eyes:     Pupils: Pupils are equal, round, and reactive to light.  Cardiovascular:     Rate and Rhythm: Normal rate and regular rhythm.     Pulses: Normal pulses.     Heart sounds: Normal heart sounds. No murmur.  Pulmonary:     Effort: Pulmonary effort is normal.      Breath sounds: Normal breath sounds.  Abdominal:     General: Bowel sounds are normal. There is no distension.     Tenderness: There is abdominal tenderness (generalized).  Musculoskeletal:        General: Normal range of motion.  Skin:    General: Skin is warm and dry.     Capillary Refill: Capillary refill takes less than 2 seconds.  Neurological:     General: No focal deficit present.     Mental Status: She is alert and oriented to person, place, and time.     Cranial Nerves: No cranial nerve deficit.  Psychiatric:        Mood and Affect: Mood normal.         Assessment And Plan:     1. Drug-induced constipation  I have encouraged her to take the senna regularly to help with the constipation related to her pain medications - senna (SENOKOT) 8.6 MG TABS tablet; Take 1 tablet (8.6 mg total) by mouth 2 (two) times daily. (Patient not taking: Reported on 09/06/2019)  Dispense: 120 tablet; Refill: 1  2. Elevated blood-pressure reading without diagnosis of hypertension  Blood pressure was elevated in the ER but is normal today  3. Colon cancer metastasized to liver [facility])  She has a history and is being followed by Oncology        Minette Brine, FNP    THE PATIENT IS ENCOURAGED TO PRACTICE SOCIAL DISTANCING DUE TO THE COVID-19 PANDEMIC.

## 2019-09-28 NOTE — Progress Notes (Signed)
Pharmacist Chemotherapy Monitoring - Follow Up Assessment    I verify that I have reviewed each item in the below checklist:  . Regimen for the patient is scheduled for the appropriate day and plan matches scheduled date. Marland Kitchen Appropriate non-routine labs are ordered dependent on drug ordered. . If applicable, additional medications reviewed and ordered per protocol based on lifetime cumulative doses and/or treatment regimen.   Plan for follow-up and/or issues identified: No . I-vent associated with next due treatment: No .   Sohil Timko D 09/28/2019 3:56 PM

## 2019-09-30 DIAGNOSIS — Z23 Encounter for immunization: Secondary | ICD-10-CM | POA: Diagnosis not present

## 2019-10-04 ENCOUNTER — Inpatient Hospital Stay: Payer: Medicare Other

## 2019-10-04 ENCOUNTER — Other Ambulatory Visit: Payer: Self-pay

## 2019-10-04 ENCOUNTER — Inpatient Hospital Stay (HOSPITAL_BASED_OUTPATIENT_CLINIC_OR_DEPARTMENT_OTHER): Payer: Medicare Other | Admitting: Oncology

## 2019-10-04 VITALS — BP 150/89 | HR 81 | Temp 98.2°F | Resp 17 | Ht 63.0 in | Wt 130.0 lb

## 2019-10-04 DIAGNOSIS — Z7689 Persons encountering health services in other specified circumstances: Secondary | ICD-10-CM | POA: Diagnosis not present

## 2019-10-04 DIAGNOSIS — Z95828 Presence of other vascular implants and grafts: Secondary | ICD-10-CM

## 2019-10-04 DIAGNOSIS — C787 Secondary malignant neoplasm of liver and intrahepatic bile duct: Secondary | ICD-10-CM | POA: Diagnosis not present

## 2019-10-04 DIAGNOSIS — C189 Malignant neoplasm of colon, unspecified: Secondary | ICD-10-CM | POA: Diagnosis not present

## 2019-10-04 DIAGNOSIS — Z5111 Encounter for antineoplastic chemotherapy: Secondary | ICD-10-CM | POA: Diagnosis not present

## 2019-10-04 DIAGNOSIS — C786 Secondary malignant neoplasm of retroperitoneum and peritoneum: Secondary | ICD-10-CM | POA: Diagnosis not present

## 2019-10-04 DIAGNOSIS — K769 Liver disease, unspecified: Secondary | ICD-10-CM

## 2019-10-04 DIAGNOSIS — Z5112 Encounter for antineoplastic immunotherapy: Secondary | ICD-10-CM | POA: Diagnosis not present

## 2019-10-04 DIAGNOSIS — K6389 Other specified diseases of intestine: Secondary | ICD-10-CM

## 2019-10-04 LAB — CMP (CANCER CENTER ONLY)
ALT: 15 U/L (ref 0–44)
AST: 19 U/L (ref 15–41)
Albumin: 3.3 g/dL — ABNORMAL LOW (ref 3.5–5.0)
Alkaline Phosphatase: 141 U/L — ABNORMAL HIGH (ref 38–126)
Anion gap: 8 (ref 5–15)
BUN: 9 mg/dL (ref 8–23)
CO2: 27 mmol/L (ref 22–32)
Calcium: 9.2 mg/dL (ref 8.9–10.3)
Chloride: 106 mmol/L (ref 98–111)
Creatinine: 0.94 mg/dL (ref 0.44–1.00)
GFR, Est AFR Am: >60 mL/min (ref >60)
GFR, Estimated: >60 mL/min (ref >60)
Glucose, Bld: 106 mg/dL — ABNORMAL HIGH (ref 70–99)
Potassium: 4 mmol/L (ref 3.5–5.1)
Sodium: 141 mmol/L (ref 135–145)
Total Bilirubin: 0.3 mg/dL (ref 0.3–1.2)
Total Protein: 7 g/dL (ref 6.5–8.1)

## 2019-10-04 LAB — CBC WITH DIFFERENTIAL (CANCER CENTER ONLY)
Abs Immature Granulocytes: 0.95 10*3/uL — ABNORMAL HIGH (ref 0.00–0.07)
Basophils Absolute: 0.1 10*3/uL (ref 0.0–0.1)
Basophils Relative: 0 %
Eosinophils Absolute: 0.1 10*3/uL (ref 0.0–0.5)
Eosinophils Relative: 0 %
HCT: 36.3 % (ref 36.0–46.0)
Hemoglobin: 11.7 g/dL — ABNORMAL LOW (ref 12.0–15.0)
Immature Granulocytes: 5 %
Lymphocytes Relative: 10 %
Lymphs Abs: 1.8 10*3/uL (ref 0.7–4.0)
MCH: 32.4 pg (ref 26.0–34.0)
MCHC: 32.2 g/dL (ref 30.0–36.0)
MCV: 100.6 fL — ABNORMAL HIGH (ref 80.0–100.0)
Monocytes Absolute: 1 10*3/uL (ref 0.1–1.0)
Monocytes Relative: 6 %
Neutro Abs: 14.1 10*3/uL — ABNORMAL HIGH (ref 1.7–7.7)
Neutrophils Relative %: 79 %
Platelet Count: 214 10*3/uL (ref 150–400)
RBC: 3.61 MIL/uL — ABNORMAL LOW (ref 3.87–5.11)
RDW: 17.5 % — ABNORMAL HIGH (ref 11.5–15.5)
WBC Count: 17.9 10*3/uL — ABNORMAL HIGH (ref 4.0–10.5)
nRBC: 0.3 % — ABNORMAL HIGH (ref 0.0–0.2)

## 2019-10-04 LAB — CEA (IN HOUSE-CHCC): CEA (CHCC-In House): 23.63 ng/mL — ABNORMAL HIGH (ref 0.00–5.00)

## 2019-10-04 LAB — TOTAL PROTEIN, URINE DIPSTICK: Protein, ur: NEGATIVE mg/dL

## 2019-10-04 MED ORDER — SODIUM CHLORIDE 0.9% FLUSH
10.0000 mL | INTRAVENOUS | Status: DC | PRN
  Filled 2019-10-04: qty 10

## 2019-10-04 MED ORDER — ATROPINE SULFATE 1 MG/ML IJ SOLN
INTRAMUSCULAR | Status: AC
  Filled 2019-10-04: qty 1

## 2019-10-04 MED ORDER — PALONOSETRON HCL INJECTION 0.25 MG/5ML
0.2500 mg | Freq: Once | INTRAVENOUS | Status: AC
  Administered 2019-10-04: 0.25 mg via INTRAVENOUS

## 2019-10-04 MED ORDER — FLUOROURACIL CHEMO INJECTION 2.5 GM/50ML
400.0000 mg/m2 | Freq: Once | INTRAVENOUS | Status: AC
  Administered 2019-10-04: 650 mg via INTRAVENOUS
  Filled 2019-10-04: qty 13

## 2019-10-04 MED ORDER — HEPARIN SOD (PORK) LOCK FLUSH 100 UNIT/ML IV SOLN
500.0000 [IU] | Freq: Once | INTRAVENOUS | Status: DC | PRN
  Filled 2019-10-04: qty 5

## 2019-10-04 MED ORDER — ATROPINE SULFATE 1 MG/ML IJ SOLN
0.5000 mg | Freq: Once | INTRAMUSCULAR | Status: AC | PRN
  Administered 2019-10-04: 0.5 mg via INTRAVENOUS

## 2019-10-04 MED ORDER — SODIUM CHLORIDE 0.9 % IV SOLN
10.0000 mg | Freq: Once | INTRAVENOUS | Status: AC
  Administered 2019-10-04: 10 mg via INTRAVENOUS
  Filled 2019-10-04: qty 10

## 2019-10-04 MED ORDER — SODIUM CHLORIDE 0.9 % IV SOLN
180.0000 mg/m2 | Freq: Once | INTRAVENOUS | Status: AC
  Administered 2019-10-04: 300 mg via INTRAVENOUS
  Filled 2019-10-04: qty 15

## 2019-10-04 MED ORDER — SODIUM CHLORIDE 0.9 % IV SOLN
400.0000 mg/m2 | Freq: Once | INTRAVENOUS | Status: AC
  Administered 2019-10-04: 656 mg via INTRAVENOUS
  Filled 2019-10-04: qty 32.8

## 2019-10-04 MED ORDER — SODIUM CHLORIDE 0.9 % IV SOLN
5.0000 mg/kg | Freq: Once | INTRAVENOUS | Status: AC
  Administered 2019-10-04: 300 mg via INTRAVENOUS
  Filled 2019-10-04: qty 12

## 2019-10-04 MED ORDER — SODIUM CHLORIDE 0.9 % IV SOLN
Freq: Once | INTRAVENOUS | Status: AC
  Filled 2019-10-04: qty 250

## 2019-10-04 MED ORDER — SODIUM CHLORIDE 0.9 % IV SOLN
2400.0000 mg/m2 | INTRAVENOUS | Status: DC
  Administered 2019-10-04: 3950 mg via INTRAVENOUS
  Filled 2019-10-04: qty 79

## 2019-10-04 MED ORDER — SODIUM CHLORIDE 0.9% FLUSH
10.0000 mL | Freq: Once | INTRAVENOUS | Status: AC
  Administered 2019-10-04: 10 mL
  Filled 2019-10-04: qty 10

## 2019-10-04 MED ORDER — PALONOSETRON HCL INJECTION 0.25 MG/5ML
INTRAVENOUS | Status: AC
  Filled 2019-10-04: qty 5

## 2019-10-04 NOTE — Patient Instructions (Signed)
[facility] Discharge Instructions for Patients Receiving Chemotherapy  Today you received the following chemotherapy agents: bevacizumab/irinotecan/leucovorin/fluorouracil.  To help prevent nausea and vomiting after your treatment, we encourage you to take your nausea medication as directed.   If you develop nausea and vomiting that is not controlled by your nausea medication, call the clinic.   BELOW ARE SYMPTOMS THAT SHOULD BE REPORTED IMMEDIATELY:  *FEVER GREATER THAN 100.5 F  *CHILLS WITH OR WITHOUT FEVER  NAUSEA AND VOMITING THAT IS NOT CONTROLLED WITH YOUR NAUSEA MEDICATION  *UNUSUAL SHORTNESS OF BREATH  *UNUSUAL BRUISING OR BLEEDING  TENDERNESS IN MOUTH AND THROAT WITH OR WITHOUT PRESENCE OF ULCERS  *URINARY PROBLEMS  *BOWEL PROBLEMS  UNUSUAL RASH Items with * indicate a potential emergency and should be followed up as soon as possible.  Feel free to call the clinic should you have any questions or concerns. The clinic phone number is [phone].  Please show the CHEMO ALERT CARD at check-in to the Emergency Department and triage nurse.   

## 2019-10-04 NOTE — Progress Notes (Signed)
Hematology and Oncology Follow Up Visit  Margaret Elliott [id number] [birth year]    10/04/19   Principle Diagnosis: 69 year old woman with stage IV colon cancer with peritoneal involvement after initial diagnosis 2014. 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 Prior Therapy:  She is status post laparoscopic laparotomy right hemicolectomy with ileocolonic anastomosis done on December 09, 2012.   FOLFOX and a Avastin chemotherapy started 01/18/2013. She is S/P 12 cycles completed in 05/2013.   She is a status post microwave ablation of metastatic lesion in the posterior segment of the right lobe of the liver completed on 09/28/2014 and repeated on 11/30/2014.  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  FOLFIRI and a Avastin salvage therapy started on 12/10/2015.  Therapy discontinued and maintained on 5-FU leucovorin with a Avastin only till March 2020.  FOLFIRI and a Avastin restarted on 10/05/2018.   She is status post 12 cycles of therapy.    Maintenance 5-FU, leucovorin and a Avastin maintenance restarted on October 7 of 2020.    Current therapy:    FOLFIRI and a Avastin restarted on September 06, 2019.  She is here for cycle 3 of therapy.   Interim History:  Margaret Elliott presents today for a follow-up visit.  Since her last visit, she reports no major changes in her health.  She tolerated the last cycle of chemotherapy without any major complications.  She does report some fatigue tiredness the last few days after chemotherapy.  She does report nausea but controlled with current medication without any vomiting.  She denies any recent falls or syncope.  She denies any excessive weakness or tiredness.  Her appetite is reasonable and weight is unchanged.                   Medications: Updated and reviewed Current Outpatient Prescriptions   Medication Sig Dispense Refill  . acetaminophen (TYLENOL) 500 MG tablet Take 500 mg by mouth every 6 (six) hours as needed for pain.      . diphenhydrAMINE (BENADRYL) 25 mg capsule Take 25-50 mg by mouth daily as needed for itching. For allergic reaction      . lidocaine-prilocaine (EMLA) cream Apply 1 application topically as needed. Apply approx 1/2 tsp to skin over port, prior to chemotherapy treatments  1 kit  3  . Multiple Vitamin (MULTIVITAMIN) tablet Take 1 tablet by mouth daily.      . ondansetron (ZOFRAN) 8 MG tablet Take 1 tablet (8 mg total) by mouth every 8 (eight) hours as needed for nausea.  30 tablet  1   No current facility-administered medications for this visit.     Allergies: No Known Allergies      Physical Exam:   Blood pressure (!) 150/89, pulse 81, temperature 98.2 F (36.8 C), temperature source Temporal, resp. rate 17, height '5\' 3"'$  (1.6 m), weight 130 lb (59 kg), SpO2 100 %.     ECOG: 1  General appearance: Comfortable appearing without any discomfort Head: Normocephalic without any trauma Oropharynx: Mucous membranes are moist and pink without any thrush or ulcers. Eyes: Pupils are equal and round reactive to light. Lymph nodes: No cervical, supraclavicular, inguinal or axillary lymphadenopathy.   Heart:regular rate and rhythm.  S1 and S2 without leg edema. Lung: Clear without any rhonchi or wheezes.  No dullness to percussion. Abdomin: Soft, nontender, nondistended with good bowel sounds.  No hepatosplenomegaly. Musculoskeletal: No joint deformity or effusion.  Full range of motion noted. Neurological: No deficits noted on motor, sensory and deep tendon reflex exam. Skin: No petechial rash or dryness.  Appeared moist.                      CBC    Component Value Date/Time   WBC 8.8 09/20/2019 0814   WBC 5.5 08/27/2019 0615   RBC 3.70 (L) 09/20/2019 0814   HGB 12.2 09/20/2019 0814   HGB 9.7 (L) 06/14/2019 1634   HGB 12.3  05/26/2017 0809   HCT 36.9 09/20/2019 0814   HCT 29.4 (L) 06/14/2019 1634   HCT 37.6 05/26/2017 0809   PLT 200 09/20/2019 0814   PLT 298 06/14/2019 1634   MCV 99.7 09/20/2019 0814   MCV 98 (H) 06/14/2019 1634   MCV 100.3 05/26/2017 0809   MCH 33.0 09/20/2019 0814   MCHC 33.1 09/20/2019 0814   RDW 16.4 (H) 09/20/2019 0814   RDW 15.7 (H) 06/14/2019 1634   RDW 17.4 (H) 05/26/2017 0809   LYMPHSABS 1.6 09/20/2019 0814   LYMPHSABS 1.2 06/14/2019 1634   LYMPHSABS 1.2 05/26/2017 0809   MONOABS 0.6 09/20/2019 0814   MONOABS 0.5 05/26/2017 0809   EOSABS 0.1 09/20/2019 0814   EOSABS 0.0 06/14/2019 1634   BASOSABS 0.0 09/20/2019 0814   BASOSABS 0.0 06/14/2019 1634   BASOSABS 0.0 05/26/2017 0809    Results for Margaret Elliott, Margaret Elliott (MRN [id number]) as of 10/04/2019 11:45  Ref. Range 09/06/2019 10:00 09/20/2019 08:14  CEA ([facility]) Latest Ref Range: 0.00 - 5.00 ng/mL 11.66 (H) 13.94 (H)        Impression and Plan:  69 year old woman with:   1.  Colon cancer diagnosed in 2014.  She was found to have hepatic metastasis and subsequently developed peritoneal involvement with tumor found to be NRAS mutated, MSI stable, BRAF negative.    She remains on FOLFIRI and a Avastin without any major complications with CEA continues to slowly rise.  Risks and benefits of continuing this treatment long-term was discussed.  Potential complications with diarrhea, nausea, myelosuppression among others.  Alternative options such as regorafenib were also discussed.  She is agreeable to continue for the time being with tentatively repeat imaging studies after cycle 4 or 5 of therapy.   2.  IV access: Port-A-Cath remains in use without any issues.   3. 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  4.  Abdominal pain: Resolved at this time after her constipation improved.  She does report some periodic soreness.  5.  Nausea: Compazine and Zofran is available to her at this time.  6.   Prognosis: Her disease remains incurable although aggressive measures are warranted given her reasonable performance status.  Any treatment at this time is palliative.  7.  Anxiety: Manageable with as needed Ativan at this time.  8.  Weight loss: Weight is stable at this time.  She continues to improve her nutritional intake.  9. Followup: In 2 weeks for the next cycle of therapy.   30 minutes were spent on this visit.  The time was dedicated to updating her disease status, discussing treatment options, addressing issues and concerns including complications to therapy and overall prognosis.  Zola Button MD 10/04/19

## 2019-10-05 ENCOUNTER — Telehealth: Payer: Self-pay | Admitting: Oncology

## 2019-10-05 NOTE — Telephone Encounter (Signed)
Scheduled appt per 4/14 los. °

## 2019-10-06 ENCOUNTER — Inpatient Hospital Stay: Payer: Medicare Other

## 2019-10-06 ENCOUNTER — Other Ambulatory Visit: Payer: Self-pay

## 2019-10-06 VITALS — BP 130/76 | HR 80 | Temp 98.2°F | Resp 18

## 2019-10-06 DIAGNOSIS — K6389 Other specified diseases of intestine: Secondary | ICD-10-CM

## 2019-10-06 DIAGNOSIS — C189 Malignant neoplasm of colon, unspecified: Secondary | ICD-10-CM | POA: Diagnosis not present

## 2019-10-06 DIAGNOSIS — C787 Secondary malignant neoplasm of liver and intrahepatic bile duct: Secondary | ICD-10-CM | POA: Diagnosis not present

## 2019-10-06 DIAGNOSIS — C786 Secondary malignant neoplasm of retroperitoneum and peritoneum: Secondary | ICD-10-CM | POA: Diagnosis not present

## 2019-10-06 DIAGNOSIS — Z7689 Persons encountering health services in other specified circumstances: Secondary | ICD-10-CM | POA: Diagnosis not present

## 2019-10-06 DIAGNOSIS — Z5111 Encounter for antineoplastic chemotherapy: Secondary | ICD-10-CM | POA: Diagnosis not present

## 2019-10-06 DIAGNOSIS — Z5112 Encounter for antineoplastic immunotherapy: Secondary | ICD-10-CM | POA: Diagnosis not present

## 2019-10-06 DIAGNOSIS — K769 Liver disease, unspecified: Secondary | ICD-10-CM

## 2019-10-06 MED ORDER — HEPARIN SOD (PORK) LOCK FLUSH 100 UNIT/ML IV SOLN
500.0000 [IU] | Freq: Once | INTRAVENOUS | Status: AC | PRN
  Administered 2019-10-06: 500 [IU]
  Filled 2019-10-06: qty 5

## 2019-10-06 MED ORDER — PEGFILGRASTIM-CBQV 6 MG/0.6ML ~~LOC~~ SOSY
6.0000 mg | PREFILLED_SYRINGE | Freq: Once | SUBCUTANEOUS | Status: AC
  Administered 2019-10-06: 6 mg via SUBCUTANEOUS

## 2019-10-06 MED ORDER — SODIUM CHLORIDE 0.9% FLUSH
10.0000 mL | INTRAVENOUS | Status: DC | PRN
  Administered 2019-10-06: 10 mL
  Filled 2019-10-06: qty 10

## 2019-10-13 ENCOUNTER — Other Ambulatory Visit: Payer: Self-pay | Admitting: Oncology

## 2019-10-16 NOTE — Progress Notes (Signed)
Pharmacist Chemotherapy Monitoring - Follow Up Assessment    I verify that I have reviewed each item in the below checklist:  . Regimen for the patient is scheduled for the appropriate day and plan matches scheduled date. Marland Kitchen Appropriate non-routine labs are ordered dependent on drug ordered. . If applicable, additional medications reviewed and ordered per protocol based on lifetime cumulative doses and/or treatment regimen.   Plan for follow-up and/or issues identified: No . I-vent associated with next due treatment: No . MD and/or nursing notified: No  Acquanetta Belling 10/16/2019 2:23 PM

## 2019-10-18 ENCOUNTER — Inpatient Hospital Stay (HOSPITAL_BASED_OUTPATIENT_CLINIC_OR_DEPARTMENT_OTHER): Payer: Medicare Other | Admitting: Oncology

## 2019-10-18 ENCOUNTER — Other Ambulatory Visit: Payer: Self-pay

## 2019-10-18 ENCOUNTER — Inpatient Hospital Stay: Payer: Medicare Other

## 2019-10-18 VITALS — BP 127/76 | HR 86 | Temp 98.3°F | Resp 18 | Wt 128.1 lb

## 2019-10-18 VITALS — BP 112/66 | HR 85

## 2019-10-18 DIAGNOSIS — Z5111 Encounter for antineoplastic chemotherapy: Secondary | ICD-10-CM | POA: Diagnosis not present

## 2019-10-18 DIAGNOSIS — C787 Secondary malignant neoplasm of liver and intrahepatic bile duct: Secondary | ICD-10-CM | POA: Diagnosis not present

## 2019-10-18 DIAGNOSIS — K769 Liver disease, unspecified: Secondary | ICD-10-CM

## 2019-10-18 DIAGNOSIS — C189 Malignant neoplasm of colon, unspecified: Secondary | ICD-10-CM

## 2019-10-18 DIAGNOSIS — K6389 Other specified diseases of intestine: Secondary | ICD-10-CM

## 2019-10-18 DIAGNOSIS — C786 Secondary malignant neoplasm of retroperitoneum and peritoneum: Secondary | ICD-10-CM | POA: Diagnosis not present

## 2019-10-18 DIAGNOSIS — Z5112 Encounter for antineoplastic immunotherapy: Secondary | ICD-10-CM | POA: Diagnosis not present

## 2019-10-18 DIAGNOSIS — Z95828 Presence of other vascular implants and grafts: Secondary | ICD-10-CM

## 2019-10-18 DIAGNOSIS — Z7689 Persons encountering health services in other specified circumstances: Secondary | ICD-10-CM | POA: Diagnosis not present

## 2019-10-18 LAB — CBC WITH DIFFERENTIAL (CANCER CENTER ONLY)
Abs Immature Granulocytes: 0.97 10*3/uL — ABNORMAL HIGH (ref 0.00–0.07)
Basophils Absolute: 0 10*3/uL (ref 0.0–0.1)
Basophils Relative: 0 %
Eosinophils Absolute: 0.1 10*3/uL (ref 0.0–0.5)
Eosinophils Relative: 0 %
HCT: 34.3 % — ABNORMAL LOW (ref 36.0–46.0)
Hemoglobin: 11.2 g/dL — ABNORMAL LOW (ref 12.0–15.0)
Immature Granulocytes: 6 %
Lymphocytes Relative: 11 %
Lymphs Abs: 1.8 10*3/uL (ref 0.7–4.0)
MCH: 32 pg (ref 26.0–34.0)
MCHC: 32.7 g/dL (ref 30.0–36.0)
MCV: 98 fL (ref 80.0–100.0)
Monocytes Absolute: 1.1 10*3/uL — ABNORMAL HIGH (ref 0.1–1.0)
Monocytes Relative: 7 %
Neutro Abs: 12.2 10*3/uL — ABNORMAL HIGH (ref 1.7–7.7)
Neutrophils Relative %: 76 %
Platelet Count: 238 10*3/uL (ref 150–400)
RBC: 3.5 MIL/uL — ABNORMAL LOW (ref 3.87–5.11)
RDW: 18.1 % — ABNORMAL HIGH (ref 11.5–15.5)
WBC Count: 16.2 10*3/uL — ABNORMAL HIGH (ref 4.0–10.5)
nRBC: 0 % (ref 0.0–0.2)

## 2019-10-18 LAB — CMP (CANCER CENTER ONLY)
ALT: 13 U/L (ref 0–44)
AST: 17 U/L (ref 15–41)
Albumin: 3.3 g/dL — ABNORMAL LOW (ref 3.5–5.0)
Alkaline Phosphatase: 137 U/L — ABNORMAL HIGH (ref 38–126)
Anion gap: 11 (ref 5–15)
BUN: 13 mg/dL (ref 8–23)
CO2: 25 mmol/L (ref 22–32)
Calcium: 9.1 mg/dL (ref 8.9–10.3)
Chloride: 108 mmol/L (ref 98–111)
Creatinine: 0.87 mg/dL (ref 0.44–1.00)
GFR, Est AFR Am: >60 mL/min (ref >60)
GFR, Estimated: >60 mL/min (ref >60)
Glucose, Bld: 91 mg/dL (ref 70–99)
Potassium: 3.2 mmol/L — ABNORMAL LOW (ref 3.5–5.1)
Sodium: 144 mmol/L (ref 135–145)
Total Bilirubin: 0.4 mg/dL (ref 0.3–1.2)
Total Protein: 6.6 g/dL (ref 6.5–8.1)

## 2019-10-18 LAB — CEA (IN HOUSE-CHCC): CEA (CHCC-In House): 19.21 ng/mL — ABNORMAL HIGH (ref 0.00–5.00)

## 2019-10-18 MED ORDER — SODIUM CHLORIDE 0.9 % IV SOLN
5.0000 mg/kg | Freq: Once | INTRAVENOUS | Status: AC
  Administered 2019-10-18: 11:00:00 300 mg via INTRAVENOUS
  Filled 2019-10-18: qty 12

## 2019-10-18 MED ORDER — SODIUM CHLORIDE 0.9 % IV SOLN
10.0000 mg | Freq: Once | INTRAVENOUS | Status: AC
  Administered 2019-10-18: 10 mg via INTRAVENOUS
  Filled 2019-10-18: qty 10

## 2019-10-18 MED ORDER — HYDROCODONE-ACETAMINOPHEN 5-325 MG PO TABS: 1.0000 | ORAL_TABLET | Freq: Four times a day (QID) | ORAL | 0 refills | Status: DC | PRN

## 2019-10-18 MED ORDER — SODIUM CHLORIDE 0.9 % IV SOLN
180.0000 mg/m2 | Freq: Once | INTRAVENOUS | Status: AC
  Administered 2019-10-18: 300 mg via INTRAVENOUS
  Filled 2019-10-18: qty 15

## 2019-10-18 MED ORDER — SODIUM CHLORIDE 0.9% FLUSH
10.0000 mL | Freq: Once | INTRAVENOUS | Status: AC
  Administered 2019-10-18: 09:00:00 10 mL
  Filled 2019-10-18: qty 10

## 2019-10-18 MED ORDER — SODIUM CHLORIDE 0.9 % IV SOLN
2400.0000 mg/m2 | INTRAVENOUS | Status: DC
  Administered 2019-10-18: 3950 mg via INTRAVENOUS
  Filled 2019-10-18: qty 79

## 2019-10-18 MED ORDER — SODIUM CHLORIDE 0.9 % IV SOLN
Freq: Once | INTRAVENOUS | Status: AC
  Filled 2019-10-18: qty 250

## 2019-10-18 MED ORDER — SODIUM CHLORIDE 0.9 % IV SOLN
400.0000 mg/m2 | Freq: Once | INTRAVENOUS | Status: AC
  Administered 2019-10-18: 656 mg via INTRAVENOUS
  Filled 2019-10-18: qty 32.8

## 2019-10-18 MED ORDER — ATROPINE SULFATE 1 MG/ML IJ SOLN
INTRAMUSCULAR | Status: AC
  Filled 2019-10-18: qty 1

## 2019-10-18 MED ORDER — SODIUM CHLORIDE 0.9% FLUSH
10.0000 mL | INTRAVENOUS | Status: DC | PRN
  Filled 2019-10-18: qty 10

## 2019-10-18 MED ORDER — PALONOSETRON HCL INJECTION 0.25 MG/5ML
0.2500 mg | Freq: Once | INTRAVENOUS | Status: AC
  Administered 2019-10-18: 0.25 mg via INTRAVENOUS

## 2019-10-18 MED ORDER — PALONOSETRON HCL INJECTION 0.25 MG/5ML
INTRAVENOUS | Status: AC
  Filled 2019-10-18: qty 5

## 2019-10-18 MED ORDER — ATROPINE SULFATE 1 MG/ML IJ SOLN
0.5000 mg | Freq: Once | INTRAMUSCULAR | Status: AC | PRN
  Administered 2019-10-18: 0.5 mg via INTRAVENOUS

## 2019-10-18 MED ORDER — FLUOROURACIL CHEMO INJECTION 2.5 GM/50ML
400.0000 mg/m2 | Freq: Once | INTRAVENOUS | Status: AC
  Administered 2019-10-18: 650 mg via INTRAVENOUS
  Filled 2019-10-18: qty 13

## 2019-10-18 NOTE — Progress Notes (Signed)
Hematology and Oncology Follow Up Visit  Margaret Elliott [id number] Sep 09, 1950    10/18/19   Principle Diagnosis: 69 year old woman with colon cancer presented with hepatic metastasis and subsequently peritoneal involvement in 2017.  She has stage IV, NRAS mutated, MSI stable disease.   Prior Therapy:  She is status post laparoscopic laparotomy right hemicolectomy with ileocolonic anastomosis done on December 09, 2012.   FOLFOX and a Avastin chemotherapy started 01/18/2013. She is S/P 12 cycles completed in 05/2013.   She is a status post microwave ablation of metastatic lesion in the posterior segment of the right lobe of the liver completed on 09/28/2014 and repeated on 11/30/2014.  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  FOLFIRI and a Avastin salvage therapy started on 12/10/2015.  Therapy discontinued and maintained on 5-FU leucovorin with a Avastin only till March 2020.  FOLFIRI and a Avastin restarted on 10/05/2018.   She is status post 12 cycles of therapy.    Maintenance 5-FU, leucovorin and a Avastin maintenance restarted on October 7 of 2020.    Current therapy:    FOLFIRI and a Avastin restarted on September 06, 2019.  She is here for cycle 4 of therapy.   Interim History:  Margaret Elliott returns today for a repeat evaluation.  Since the last visit, she reports no major changes in her health.  She has received the last cycle of chemotherapy with few complaints predominantly nausea and fatigue.  She does report some occasional diarrhea and weakness.  She still able to perform most activities of daily living without any decline in ability to do so.  She denies any recent hospitalizations or illnesses.  Performance status remained stable.                   Medications: Unchanged on review. Current Outpatient Prescriptions  Medication Sig Dispense Refill  . acetaminophen (TYLENOL) 500 MG  tablet Take 500 mg by mouth every 6 (six) hours as needed for pain.      . diphenhydrAMINE (BENADRYL) 25 mg capsule Take 25-50 mg by mouth daily as needed for itching. For allergic reaction      . lidocaine-prilocaine (EMLA) cream Apply 1 application topically as needed. Apply approx 1/2 tsp to skin over port, prior to chemotherapy treatments  1 kit  3  . Multiple Vitamin (MULTIVITAMIN) tablet Take 1 tablet by mouth daily.      . ondansetron (ZOFRAN) 8 MG tablet Take 1 tablet (8 mg total) by mouth every 8 (eight) hours as needed for nausea.  30 tablet  1   No current facility-administered medications for this visit.     Allergies: No Known Allergies      Physical Exam:   Blood pressure 127/76, pulse 86, temperature 98.3 F (36.8 C), temperature source Temporal, resp. rate 18, weight 128 lb 1.6 oz (58.1 kg), SpO2 98 %.      ECOG: 1    General appearance: Alert, awake without any distress. Head: Atraumatic without abnormalities Oropharynx: Without any thrush or ulcers. Eyes: No scleral icterus. Lymph nodes: No lymphadenopathy noted in the cervical, supraclavicular, or axillary nodes Heart:regular rate and rhythm, without any murmurs or gallops.   Lung: Clear to auscultation without any rhonchi, wheezes or dullness to percussion. Abdomin: Soft, nontender without any shifting dullness or ascites. Musculoskeletal: No clubbing or cyanosis. Neurological: No motor or sensory deficits. Skin: No rashes or lesions.  CBC    Component Value Date/Time   WBC 17.9 (H) 10/04/2019 1142   WBC 5.5 08/27/2019 0615   RBC 3.61 (L) 10/04/2019 1142   HGB 11.7 (L) 10/04/2019 1142   HGB 9.7 (L) 06/14/2019 1634   HGB 12.3 05/26/2017 0809   HCT 36.3 10/04/2019 1142   HCT 29.4 (L) 06/14/2019 1634   HCT 37.6 05/26/2017 0809   PLT 214 10/04/2019 1142   PLT 298 06/14/2019 1634   MCV 100.6 (H) 10/04/2019 1142   MCV 98 (H) 06/14/2019 1634   MCV 100.3  05/26/2017 0809   MCH 32.4 10/04/2019 1142   MCHC 32.2 10/04/2019 1142   RDW 17.5 (H) 10/04/2019 1142   RDW 15.7 (H) 06/14/2019 1634   RDW 17.4 (H) 05/26/2017 0809   LYMPHSABS 1.8 10/04/2019 1142   LYMPHSABS 1.2 06/14/2019 1634   LYMPHSABS 1.2 05/26/2017 0809   MONOABS 1.0 10/04/2019 1142   MONOABS 0.5 05/26/2017 0809   EOSABS 0.1 10/04/2019 1142   EOSABS 0.0 06/14/2019 1634   BASOSABS 0.1 10/04/2019 1142   BASOSABS 0.0 06/14/2019 1634   BASOSABS 0.0 05/26/2017 0809    Results for Margaret, Elliott (MRN [id number]) as of 10/18/2019 09:03  Ref. Range 05/24/2019 10:06 07/12/2019 09:55 07/26/2019 09:25 08/09/2019 09:30 08/23/2019 09:12 09/06/2019 10:00 09/20/2019 08:14 10/04/2019 11:42  CEA ([facility]) Latest Ref Range: 0.00 - 5.00 ng/mL 4.28 6.03 (H) 4.44 8.37 (H) 10.69 (H) 11.66 (H) 13.94 (H) 23.63 (H)        Impression and Plan:  69 year old woman with:   1.  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     She is status post multiple therapies outlined above and currently receiving Avastin without any major complications.  Her CEA continues to rise despite this therapy which could indicate to poor response.  Risks and benefits of continuing this treatment and repeat imaging studies were reviewed.  Alternative options such as regorafenib were also introduced.  The plan is to proceed with cycle 4 of therapy today and repeat imaging studies before cycle 5.   2.  IV access: Port-A-Cath continues to be used for chemotherapy without complications.   3. Neuropathy: No exacerbation noted on current chemotherapy.  4.  Abdominal pain: Manageable at this time but does require hydrocodone periodically.  I will refill for her.  5.  Nausea: Manageable with the current Compazine and Zofran.  6.  Prognosis: Therapy remains palliative and her disease is incurable.  Aggressive measures are warranted given her reasonable performance  status.  7.  Anxiety: Her mood currently stable at this time.  8.  Weight loss: She has lost 2 pounds and will continue to emphasize importance of nutritional supplements which she is doing.  9. Followup: She will return in 2 weeks for her subsequent psychotherapy.   30 minutes were dedicated to this encounter.  The time was spent on reviewing treatment options, addressing complications related to her current therapy and future plan of care discussion and planning.   Zola Button MD 10/18/19

## 2019-10-18 NOTE — Patient Instructions (Signed)
[facility] Discharge Instructions for Patients Receiving Chemotherapy  Today you received the following chemotherapy agents: bevacizumab/irinotecan/leucovorin/fluorouracil.  To help prevent nausea and vomiting after your treatment, we encourage you to take your nausea medication as directed.   If you develop nausea and vomiting that is not controlled by your nausea medication, call the clinic.   BELOW ARE SYMPTOMS THAT SHOULD BE REPORTED IMMEDIATELY:  *FEVER GREATER THAN 100.5 F  *CHILLS WITH OR WITHOUT FEVER  NAUSEA AND VOMITING THAT IS NOT CONTROLLED WITH YOUR NAUSEA MEDICATION  *UNUSUAL SHORTNESS OF BREATH  *UNUSUAL BRUISING OR BLEEDING  TENDERNESS IN MOUTH AND THROAT WITH OR WITHOUT PRESENCE OF ULCERS  *URINARY PROBLEMS  *BOWEL PROBLEMS  UNUSUAL RASH Items with * indicate a potential emergency and should be followed up as soon as possible.  Feel free to call the clinic should you have any questions or concerns. The clinic phone number is [phone].  Please show the CHEMO ALERT CARD at check-in to the Emergency Department and triage nurse.   

## 2019-10-20 ENCOUNTER — Inpatient Hospital Stay: Payer: Medicare Other

## 2019-10-20 ENCOUNTER — Other Ambulatory Visit: Payer: Self-pay

## 2019-10-20 VITALS — BP 118/68 | HR 78 | Temp 98.2°F | Resp 18

## 2019-10-20 DIAGNOSIS — C786 Secondary malignant neoplasm of retroperitoneum and peritoneum: Secondary | ICD-10-CM | POA: Diagnosis not present

## 2019-10-20 DIAGNOSIS — C189 Malignant neoplasm of colon, unspecified: Secondary | ICD-10-CM

## 2019-10-20 DIAGNOSIS — K6389 Other specified diseases of intestine: Secondary | ICD-10-CM

## 2019-10-20 DIAGNOSIS — Z5112 Encounter for antineoplastic immunotherapy: Secondary | ICD-10-CM | POA: Diagnosis not present

## 2019-10-20 DIAGNOSIS — Z5111 Encounter for antineoplastic chemotherapy: Secondary | ICD-10-CM | POA: Diagnosis not present

## 2019-10-20 DIAGNOSIS — K769 Liver disease, unspecified: Secondary | ICD-10-CM

## 2019-10-20 DIAGNOSIS — C787 Secondary malignant neoplasm of liver and intrahepatic bile duct: Secondary | ICD-10-CM | POA: Diagnosis not present

## 2019-10-20 DIAGNOSIS — Z7689 Persons encountering health services in other specified circumstances: Secondary | ICD-10-CM | POA: Diagnosis not present

## 2019-10-20 MED ORDER — PEGFILGRASTIM-CBQV 6 MG/0.6ML ~~LOC~~ SOSY
6.0000 mg | PREFILLED_SYRINGE | Freq: Once | SUBCUTANEOUS | Status: AC
  Administered 2019-10-20: 11:00:00 6 mg via SUBCUTANEOUS

## 2019-10-20 MED ORDER — HEPARIN SOD (PORK) LOCK FLUSH 100 UNIT/ML IV SOLN
500.0000 [IU] | Freq: Once | INTRAVENOUS | Status: AC | PRN
  Administered 2019-10-20: 500 [IU]
  Filled 2019-10-20: qty 5

## 2019-10-20 MED ORDER — SODIUM CHLORIDE 0.9% FLUSH
10.0000 mL | INTRAVENOUS | Status: DC | PRN
  Administered 2019-10-20: 10 mL
  Filled 2019-10-20: qty 10

## 2019-10-20 MED ORDER — PEGFILGRASTIM-CBQV 6 MG/0.6ML ~~LOC~~ SOSY
PREFILLED_SYRINGE | SUBCUTANEOUS | Status: AC
  Filled 2019-10-20: qty 0.6

## 2019-10-23 ENCOUNTER — Ambulatory Visit: Payer: Medicare Other | Admitting: Nurse Practitioner

## 2019-10-26 NOTE — Progress Notes (Signed)
Pharmacist Chemotherapy Monitoring - Follow Up Assessment    I verify that I have reviewed each item in the below checklist:  . Regimen for the patient is scheduled for the appropriate day and plan matches scheduled date. Marland Kitchen Appropriate non-routine labs are ordered dependent on drug ordered. . If applicable, additional medications reviewed and ordered per protocol based on lifetime cumulative doses and/or treatment regimen.   Plan for follow-up and/or issues identified: No . I-vent associated with next due treatment: No . MD and/or nursing notified: No  Philomena Course 10/26/2019 1:06 PM

## 2019-11-01 ENCOUNTER — Inpatient Hospital Stay: Payer: Medicare Other

## 2019-11-01 ENCOUNTER — Inpatient Hospital Stay (HOSPITAL_BASED_OUTPATIENT_CLINIC_OR_DEPARTMENT_OTHER): Payer: Medicare Other | Admitting: Oncology

## 2019-11-01 ENCOUNTER — Other Ambulatory Visit: Payer: Self-pay

## 2019-11-01 ENCOUNTER — Inpatient Hospital Stay: Payer: Medicare Other | Attending: Oncology

## 2019-11-01 VITALS — BP 141/81 | HR 85 | Temp 98.3°F | Resp 18 | Wt 128.0 lb

## 2019-11-01 DIAGNOSIS — C189 Malignant neoplasm of colon, unspecified: Secondary | ICD-10-CM

## 2019-11-01 DIAGNOSIS — G620 Drug-induced polyneuropathy: Secondary | ICD-10-CM | POA: Insufficient documentation

## 2019-11-01 DIAGNOSIS — Z79899 Other long term (current) drug therapy: Secondary | ICD-10-CM | POA: Diagnosis not present

## 2019-11-01 DIAGNOSIS — R110 Nausea: Secondary | ICD-10-CM | POA: Diagnosis not present

## 2019-11-01 DIAGNOSIS — R197 Diarrhea, unspecified: Secondary | ICD-10-CM | POA: Insufficient documentation

## 2019-11-01 DIAGNOSIS — Z5112 Encounter for antineoplastic immunotherapy: Secondary | ICD-10-CM | POA: Diagnosis not present

## 2019-11-01 DIAGNOSIS — G893 Neoplasm related pain (acute) (chronic): Secondary | ICD-10-CM | POA: Diagnosis not present

## 2019-11-01 DIAGNOSIS — R5383 Other fatigue: Secondary | ICD-10-CM | POA: Insufficient documentation

## 2019-11-01 DIAGNOSIS — C787 Secondary malignant neoplasm of liver and intrahepatic bile duct: Secondary | ICD-10-CM | POA: Diagnosis not present

## 2019-11-01 DIAGNOSIS — T451X5A Adverse effect of antineoplastic and immunosuppressive drugs, initial encounter: Secondary | ICD-10-CM | POA: Diagnosis not present

## 2019-11-01 DIAGNOSIS — K6389 Other specified diseases of intestine: Secondary | ICD-10-CM

## 2019-11-01 DIAGNOSIS — C786 Secondary malignant neoplasm of retroperitoneum and peritoneum: Secondary | ICD-10-CM | POA: Diagnosis not present

## 2019-11-01 DIAGNOSIS — R109 Unspecified abdominal pain: Secondary | ICD-10-CM | POA: Insufficient documentation

## 2019-11-01 DIAGNOSIS — Z95828 Presence of other vascular implants and grafts: Secondary | ICD-10-CM

## 2019-11-01 DIAGNOSIS — C19 Malignant neoplasm of rectosigmoid junction: Secondary | ICD-10-CM | POA: Diagnosis not present

## 2019-11-01 DIAGNOSIS — Z5111 Encounter for antineoplastic chemotherapy: Secondary | ICD-10-CM | POA: Diagnosis not present

## 2019-11-01 DIAGNOSIS — K769 Liver disease, unspecified: Secondary | ICD-10-CM

## 2019-11-01 DIAGNOSIS — Z7689 Persons encountering health services in other specified circumstances: Secondary | ICD-10-CM | POA: Insufficient documentation

## 2019-11-01 LAB — CBC WITH DIFFERENTIAL (CANCER CENTER ONLY)
Abs Immature Granulocytes: 2.35 10*3/uL — ABNORMAL HIGH (ref 0.00–0.07)
Basophils Absolute: 0 10*3/uL (ref 0.0–0.1)
Basophils Relative: 0 %
Eosinophils Absolute: 0.1 10*3/uL (ref 0.0–0.5)
Eosinophils Relative: 0 %
HCT: 34.9 % — ABNORMAL LOW (ref 36.0–46.0)
Hemoglobin: 11.5 g/dL — ABNORMAL LOW (ref 12.0–15.0)
Immature Granulocytes: 9 %
Lymphocytes Relative: 10 %
Lymphs Abs: 2.5 10*3/uL (ref 0.7–4.0)
MCH: 32.8 pg (ref 26.0–34.0)
MCHC: 33 g/dL (ref 30.0–36.0)
MCV: 99.4 fL (ref 80.0–100.0)
Monocytes Absolute: 1.2 10*3/uL — ABNORMAL HIGH (ref 0.1–1.0)
Monocytes Relative: 5 %
Neutro Abs: 19.5 10*3/uL — ABNORMAL HIGH (ref 1.7–7.7)
Neutrophils Relative %: 76 %
Platelet Count: 259 10*3/uL (ref 150–400)
RBC: 3.51 MIL/uL — ABNORMAL LOW (ref 3.87–5.11)
RDW: 19 % — ABNORMAL HIGH (ref 11.5–15.5)
WBC Count: 25.6 10*3/uL — ABNORMAL HIGH (ref 4.0–10.5)
nRBC: 0.2 % (ref 0.0–0.2)

## 2019-11-01 LAB — CMP (CANCER CENTER ONLY)
ALT: 13 U/L (ref 0–44)
AST: 17 U/L (ref 15–41)
Albumin: 3.4 g/dL — ABNORMAL LOW (ref 3.5–5.0)
Alkaline Phosphatase: 165 U/L — ABNORMAL HIGH (ref 38–126)
Anion gap: 10 (ref 5–15)
BUN: 10 mg/dL (ref 8–23)
CO2: 25 mmol/L (ref 22–32)
Calcium: 9.1 mg/dL (ref 8.9–10.3)
Chloride: 108 mmol/L (ref 98–111)
Creatinine: 0.86 mg/dL (ref 0.44–1.00)
GFR, Est AFR Am: >60 mL/min (ref >60)
GFR, Estimated: >60 mL/min (ref >60)
Glucose, Bld: 105 mg/dL — ABNORMAL HIGH (ref 70–99)
Potassium: 3.2 mmol/L — ABNORMAL LOW (ref 3.5–5.1)
Sodium: 143 mmol/L (ref 135–145)
Total Bilirubin: 0.5 mg/dL (ref 0.3–1.2)
Total Protein: 7 g/dL (ref 6.5–8.1)

## 2019-11-01 LAB — CEA (IN HOUSE-CHCC): CEA (CHCC-In House): 32.24 ng/mL — ABNORMAL HIGH (ref 0.00–5.00)

## 2019-11-01 MED ORDER — ATROPINE SULFATE 0.4 MG/ML IJ SOLN
INTRAMUSCULAR | Status: AC
  Filled 2019-11-01: qty 1

## 2019-11-01 MED ORDER — SODIUM CHLORIDE 0.9 % IV SOLN
180.0000 mg/m2 | Freq: Once | INTRAVENOUS | Status: AC
  Administered 2019-11-01: 11:00:00 300 mg via INTRAVENOUS
  Filled 2019-11-01: qty 15

## 2019-11-01 MED ORDER — SODIUM CHLORIDE 0.9% FLUSH
10.0000 mL | Freq: Once | INTRAVENOUS | Status: AC
  Administered 2019-11-01: 10 mL
  Filled 2019-11-01: qty 10

## 2019-11-01 MED ORDER — PALONOSETRON HCL INJECTION 0.25 MG/5ML
INTRAVENOUS | Status: AC
  Filled 2019-11-01: qty 5

## 2019-11-01 MED ORDER — SODIUM CHLORIDE 0.9 % IV SOLN
2400.0000 mg/m2 | INTRAVENOUS | Status: DC
  Administered 2019-11-01: 13:00:00 3950 mg via INTRAVENOUS
  Filled 2019-11-01: qty 79

## 2019-11-01 MED ORDER — SODIUM CHLORIDE 0.9 % IV SOLN
400.0000 mg/m2 | Freq: Once | INTRAVENOUS | Status: AC
  Administered 2019-11-01: 11:00:00 656 mg via INTRAVENOUS
  Filled 2019-11-01: qty 32.8

## 2019-11-01 MED ORDER — ATROPINE SULFATE 0.4 MG/ML IJ SOLN
0.4000 mg | Freq: Once | INTRAMUSCULAR | Status: AC | PRN
  Administered 2019-11-01: 0.4 mg via INTRAVENOUS

## 2019-11-01 MED ORDER — SODIUM CHLORIDE 0.9 % IV SOLN
Freq: Once | INTRAVENOUS | Status: AC
  Filled 2019-11-01: qty 250

## 2019-11-01 MED ORDER — FLUOROURACIL CHEMO INJECTION 2.5 GM/50ML
400.0000 mg/m2 | Freq: Once | INTRAVENOUS | Status: AC
  Administered 2019-11-01: 13:00:00 650 mg via INTRAVENOUS
  Filled 2019-11-01: qty 13

## 2019-11-01 MED ORDER — SODIUM CHLORIDE 0.9 % IV SOLN
10.0000 mg | Freq: Once | INTRAVENOUS | Status: AC
  Administered 2019-11-01: 10:00:00 10 mg via INTRAVENOUS
  Filled 2019-11-01: qty 10

## 2019-11-01 MED ORDER — PALONOSETRON HCL INJECTION 0.25 MG/5ML
0.2500 mg | Freq: Once | INTRAVENOUS | Status: AC
  Administered 2019-11-01: 10:00:00 0.25 mg via INTRAVENOUS

## 2019-11-01 MED ORDER — SODIUM CHLORIDE 0.9 % IV SOLN
5.0000 mg/kg | Freq: Once | INTRAVENOUS | Status: AC
  Administered 2019-11-01: 300 mg via INTRAVENOUS
  Filled 2019-11-01: qty 12

## 2019-11-01 MED ORDER — ATROPINE SULFATE 1 MG/ML IJ SOLN: 0.5000 mg | Freq: Once | INTRAMUSCULAR | Status: DC | PRN

## 2019-11-01 NOTE — Patient Instructions (Signed)
[facility] Discharge Instructions for Patients Receiving Chemotherapy  Today you received the following chemotherapy agents: bevacizumab/irinotecan/leucovorin/fluorouracil.  To help prevent nausea and vomiting after your treatment, we encourage you to take your nausea medication as directed.   If you develop nausea and vomiting that is not controlled by your nausea medication, call the clinic.   BELOW ARE SYMPTOMS THAT SHOULD BE REPORTED IMMEDIATELY:  *FEVER GREATER THAN 100.5 F  *CHILLS WITH OR WITHOUT FEVER  NAUSEA AND VOMITING THAT IS NOT CONTROLLED WITH YOUR NAUSEA MEDICATION  *UNUSUAL SHORTNESS OF BREATH  *UNUSUAL BRUISING OR BLEEDING  TENDERNESS IN MOUTH AND THROAT WITH OR WITHOUT PRESENCE OF ULCERS  *URINARY PROBLEMS  *BOWEL PROBLEMS  UNUSUAL RASH Items with * indicate a potential emergency and should be followed up as soon as possible.  Feel free to call the clinic should you have any questions or concerns. The clinic phone number is [phone].  Please show the CHEMO ALERT CARD at check-in to the Emergency Department and triage nurse.   

## 2019-11-01 NOTE — Progress Notes (Signed)
Hematology and Oncology Follow Up Visit  Margaret Elliott [id number] 09-12-1950    11/01/19   Principle Diagnosis: 69 year old woman with stage IV colon cancer with peritoneal metastasis documented in 2017.  He initially presented with hepatic mass upon presentation in 2014.  Her tumor is NRAS mutated and MSI stable disease.   Prior Therapy:  She is status post laparoscopic laparotomy right hemicolectomy with ileocolonic anastomosis done on December 09, 2012.   FOLFOX and a Avastin chemotherapy started 01/18/2013. She is S/P 12 cycles completed in 05/2013.   She is a status post microwave ablation of metastatic lesion in the posterior segment of the right lobe of the liver completed on 09/28/2014 and repeated on 11/30/2014.  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  FOLFIRI and a Avastin salvage therapy started on 12/10/2015.  Therapy discontinued and maintained on 5-FU leucovorin with a Avastin only till March 2020.  FOLFIRI and a Avastin restarted on 10/05/2018.   She is status post 12 cycles of therapy.    Maintenance 5-FU, leucovorin and a Avastin maintenance restarted on October 7 of 2020.    Current therapy:    FOLFIRI and a Avastin restarted on September 06, 2019.  She is here for cycle 5 of therapy.   Interim History:  Ms. Shin is here for a follow-up visit.  Since the last visit, she reports no major changes in her health.  She tolerated the last chemotherapy cycle with few complaints.  She does report loose bowel habits periodically but no more than 2 a day.  She does use Imodium which has helped her symptoms.  She denies any nausea vomiting or abdominal discomfort.  She denies any hospitalization or illnesses.                  Medications: Reviewed without changes. Current Outpatient Prescriptions  Medication Sig Dispense Refill  . acetaminophen (TYLENOL) 500 MG tablet Take 500 mg by  mouth every 6 (six) hours as needed for pain.      . diphenhydrAMINE (BENADRYL) 25 mg capsule Take 25-50 mg by mouth daily as needed for itching. For allergic reaction      . lidocaine-prilocaine (EMLA) cream Apply 1 application topically as needed. Apply approx 1/2 tsp to skin over port, prior to chemotherapy treatments  1 kit  3  . Multiple Vitamin (MULTIVITAMIN) tablet Take 1 tablet by mouth daily.      . ondansetron (ZOFRAN) 8 MG tablet Take 1 tablet (8 mg total) by mouth every 8 (eight) hours as needed for nausea.  30 tablet  1   No current facility-administered medications for this visit.     Allergies: No Known Allergies      Physical Exam:      Blood pressure (!) 141/81, pulse 85, temperature 98.3 F (36.8 C), temperature source Temporal, resp. rate 18, weight 128 lb (58.1 kg), SpO2 100 %.   ECOG: 1      General appearance: Comfortable appearing without any discomfort Head: Normocephalic without any trauma Oropharynx: Mucous membranes are moist and pink without any thrush or ulcers. Eyes: Pupils are equal and round reactive to light. Lymph nodes: No cervical, supraclavicular, inguinal or axillary lymphadenopathy.   Heart:regular rate and rhythm.  S1 and S2 without leg edema. Lung: Clear without any rhonchi or wheezes.  No dullness to percussion. Abdomin: Soft, nontender, nondistended with good bowel sounds.  No hepatosplenomegaly. Musculoskeletal: No joint deformity or  effusion.  Full range of motion noted. Neurological: No deficits noted on motor, sensory and deep tendon reflex exam. Skin: No petechial rash or dryness.  Appeared moist.                        CBC    Component Value Date/Time   WBC 16.2 (H) 10/18/2019 0857   WBC 5.5 08/27/2019 0615   RBC 3.50 (L) 10/18/2019 0857   HGB 11.2 (L) 10/18/2019 0857   HGB 9.7 (L) 06/14/2019 1634   HGB 12.3 05/26/2017 0809   HCT 34.3 (L) 10/18/2019 0857   HCT 29.4 (L) 06/14/2019 1634   HCT  37.6 05/26/2017 0809   PLT 238 10/18/2019 0857   PLT 298 06/14/2019 1634   MCV 98.0 10/18/2019 0857   MCV 98 (H) 06/14/2019 1634   MCV 100.3 05/26/2017 0809   MCH 32.0 10/18/2019 0857   MCHC 32.7 10/18/2019 0857   RDW 18.1 (H) 10/18/2019 0857   RDW 15.7 (H) 06/14/2019 1634   RDW 17.4 (H) 05/26/2017 0809   LYMPHSABS 1.8 10/18/2019 0857   LYMPHSABS 1.2 06/14/2019 1634   LYMPHSABS 1.2 05/26/2017 0809   MONOABS 1.1 (H) 10/18/2019 0857   MONOABS 0.5 05/26/2017 0809   EOSABS 0.1 10/18/2019 0857   EOSABS 0.0 06/14/2019 1634   BASOSABS 0.0 10/18/2019 0857   BASOSABS 0.0 06/14/2019 1634   BASOSABS 0.0 05/26/2017 0809      Results for MARKESHA, HANNIG (MRN [id number]) as of 11/01/2019 08:50  Ref. Range 10/04/2019 11:42 10/18/2019 08:57  CEA ([facility]) Latest Ref Range: 0.00 - 5.00 ng/mL 23.63 (H) 19.21 (H)       Impression and Plan:  69 year old woman with:   1.  Colon cancer diagnosed in 2014.  She is currently stage IV with peritoneal metastasis documented in 2017.  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   Continues to receive salvage chemotherapy without any major complications.  Her CEA has declined slightly and staging CT scan will be done in the near future.  This was supposed to be completed before cycle 5 and will be rescheduled in the near future.  Risks and benefits of continuing chemotherapy were discussed today.  Potential complications include nausea, vomiting, myelosuppression as well as GI complaints were reviewed.  She is agreeable to continue to complaint cycle 5 and cycle 6 and potentially a treatment break after that.   2.  IV access: Port-A-Cath remains in place without any issues.   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  4.  Abdominal pain: She currently takes hydrocodone infrequently.  Her pain is related to advanced malignancy.  5.  Nausea: Currently on Compazine and Zofran with manageable nausea.  6.  Prognosis: Her  disease is incurable although aggressive measures are warranted.  Her performance status remain excellent.  7.  Anxiety: No issues related to her mood at this time.  Overall stable.  8.  Weight loss: She is eating better at this time and maintain her weight since the last visit..  9. Followup: 2 weeks for the next cycle of chemotherapy.   30 minutes were spent on this visit.  The time was dedicated to updating her disease status, addressing complications related to therapy and answering questions regarding future plan of care.     Zola Button MD 11/01/19

## 2019-11-03 ENCOUNTER — Inpatient Hospital Stay: Payer: Medicare Other

## 2019-11-03 ENCOUNTER — Other Ambulatory Visit: Payer: Self-pay

## 2019-11-03 VITALS — BP 117/73 | HR 87 | Temp 98.2°F | Resp 16

## 2019-11-03 DIAGNOSIS — C787 Secondary malignant neoplasm of liver and intrahepatic bile duct: Secondary | ICD-10-CM | POA: Diagnosis not present

## 2019-11-03 DIAGNOSIS — C189 Malignant neoplasm of colon, unspecified: Secondary | ICD-10-CM

## 2019-11-03 DIAGNOSIS — K769 Liver disease, unspecified: Secondary | ICD-10-CM

## 2019-11-03 DIAGNOSIS — C786 Secondary malignant neoplasm of retroperitoneum and peritoneum: Secondary | ICD-10-CM | POA: Diagnosis not present

## 2019-11-03 DIAGNOSIS — K6389 Other specified diseases of intestine: Secondary | ICD-10-CM

## 2019-11-03 DIAGNOSIS — Z5111 Encounter for antineoplastic chemotherapy: Secondary | ICD-10-CM | POA: Diagnosis not present

## 2019-11-03 DIAGNOSIS — Z7689 Persons encountering health services in other specified circumstances: Secondary | ICD-10-CM | POA: Diagnosis not present

## 2019-11-03 DIAGNOSIS — C19 Malignant neoplasm of rectosigmoid junction: Secondary | ICD-10-CM | POA: Diagnosis not present

## 2019-11-03 DIAGNOSIS — Z5112 Encounter for antineoplastic immunotherapy: Secondary | ICD-10-CM | POA: Diagnosis not present

## 2019-11-03 MED ORDER — PEGFILGRASTIM-CBQV 6 MG/0.6ML ~~LOC~~ SOSY
6.0000 mg | PREFILLED_SYRINGE | Freq: Once | SUBCUTANEOUS | Status: AC
  Administered 2019-11-03: 6 mg via SUBCUTANEOUS

## 2019-11-03 MED ORDER — PEGFILGRASTIM-CBQV 6 MG/0.6ML ~~LOC~~ SOSY
PREFILLED_SYRINGE | SUBCUTANEOUS | Status: AC
  Filled 2019-11-03: qty 0.6

## 2019-11-03 NOTE — Patient Instructions (Signed)

## 2019-11-09 NOTE — Progress Notes (Signed)
Pharmacist Chemotherapy Monitoring - Follow Up Assessment    I verify that I have reviewed each item in the below checklist:  . Regimen for the patient is scheduled for the appropriate day and plan matches scheduled date. Marland Kitchen Appropriate non-routine labs are ordered dependent on drug ordered. . If applicable, additional medications reviewed and ordered per protocol based on lifetime cumulative doses and/or treatment regimen.   Plan for follow-up and/or issues identified: No . I-vent associated with next due treatment: No . MD and/or nursing notified: No  Kip Cropp K 11/09/2019 10:26 AM

## 2019-11-10 ENCOUNTER — Other Ambulatory Visit: Payer: Self-pay

## 2019-11-10 ENCOUNTER — Telehealth: Payer: Self-pay

## 2019-11-10 ENCOUNTER — Other Ambulatory Visit: Payer: Self-pay | Admitting: Oncology

## 2019-11-10 ENCOUNTER — Ambulatory Visit (HOSPITAL_COMMUNITY)
Admission: RE | Admit: 2019-11-10 | Discharge: 2019-11-10 | Disposition: A | Discharge status: Home / Self Care | Payer: Medicare Other | Source: Ambulatory Visit | Attending: Oncology | Admitting: Oncology

## 2019-11-10 DIAGNOSIS — C189 Malignant neoplasm of colon, unspecified: Secondary | ICD-10-CM | POA: Insufficient documentation

## 2019-11-10 MED ORDER — IOHEXOL 300 MG/ML  SOLN
100.0000 mL | Freq: Once | INTRAMUSCULAR | Status: AC | PRN
  Administered 2019-11-10: 100 mL via INTRAVENOUS

## 2019-11-10 MED ORDER — HEPARIN SOD (PORK) LOCK FLUSH 100 UNIT/ML IV SOLN: 500.0000 [IU] | Freq: Once | INTRAVENOUS | Status: DC

## 2019-11-10 MED ORDER — HEPARIN SOD (PORK) LOCK FLUSH 100 UNIT/ML IV SOLN
INTRAVENOUS | Status: AC
  Administered 2019-11-10: 500 [IU]
  Filled 2019-11-10: qty 5

## 2019-11-10 MED ORDER — DIPHENOXYLATE-ATROPINE 2.5-0.025 MG PO TABS
1.0000 | ORAL_TABLET | Freq: Four times a day (QID) | ORAL | 0 refills | Status: AC | PRN
Start: 2019-11-10 — End: ?

## 2019-11-10 MED ORDER — SODIUM CHLORIDE (PF) 0.9 % IJ SOLN
INTRAMUSCULAR | Status: AC
  Filled 2019-11-10: qty 50

## 2019-11-10 NOTE — Telephone Encounter (Signed)
Called patient and let her know that Dr. Alen Blew sent a prescription for Lomotil to her pharmacy and to continue with vigorous oral hydration. Patient verbalized understanding and will contact the office with any further questions or concerns.

## 2019-11-10 NOTE — Telephone Encounter (Signed)
-----   Message from Wyatt Portela, MD sent at 11/10/2019 11:07 AM EDT ----- I will send Rx to lomotil.  Please instruct her to continue with vigorous oral hydration in the meantime.  Thanks ----- Message ----- From: Tami Lin, RN Sent: 11/10/2019  10:50 AM EDT To: Wyatt Portela, MD  Patient's last treatment was on 5/12. Avastin, Irinotecan, Leucovorin, 5 FU. She called to report diarrhea since last treatment with no relief after taking Imodium. She stated Imodium usually helps but has not helped this time. She also said she vomited x1 today  and has some nausea. Denies any blood in stool and states her abdominal pain is not any worse than it usually is. She is concerned because the Imodium is not helping. Lanelle Bal

## 2019-11-14 MED FILL — Dexamethasone Sodium Phosphate Inj 100 MG/10ML: INTRAMUSCULAR | Qty: 1 | Status: AC

## 2019-11-15 ENCOUNTER — Inpatient Hospital Stay: Payer: Medicare Other

## 2019-11-15 ENCOUNTER — Other Ambulatory Visit: Payer: Self-pay

## 2019-11-15 ENCOUNTER — Inpatient Hospital Stay (HOSPITAL_BASED_OUTPATIENT_CLINIC_OR_DEPARTMENT_OTHER): Payer: Medicare Other | Admitting: Oncology

## 2019-11-15 ENCOUNTER — Telehealth: Payer: Self-pay | Admitting: Pharmacist

## 2019-11-15 ENCOUNTER — Telehealth: Payer: Self-pay

## 2019-11-15 VITALS — BP 147/89 | HR 82 | Temp 97.9°F | Resp 18 | Ht 63.0 in | Wt 127.9 lb

## 2019-11-15 VITALS — BP 119/86 | HR 80 | Resp 18

## 2019-11-15 DIAGNOSIS — C189 Malignant neoplasm of colon, unspecified: Secondary | ICD-10-CM

## 2019-11-15 DIAGNOSIS — C787 Secondary malignant neoplasm of liver and intrahepatic bile duct: Secondary | ICD-10-CM

## 2019-11-15 DIAGNOSIS — Z95828 Presence of other vascular implants and grafts: Secondary | ICD-10-CM

## 2019-11-15 DIAGNOSIS — Z7689 Persons encountering health services in other specified circumstances: Secondary | ICD-10-CM | POA: Diagnosis not present

## 2019-11-15 DIAGNOSIS — C19 Malignant neoplasm of rectosigmoid junction: Secondary | ICD-10-CM | POA: Diagnosis not present

## 2019-11-15 DIAGNOSIS — Z5111 Encounter for antineoplastic chemotherapy: Secondary | ICD-10-CM | POA: Diagnosis not present

## 2019-11-15 DIAGNOSIS — Z5112 Encounter for antineoplastic immunotherapy: Secondary | ICD-10-CM | POA: Diagnosis not present

## 2019-11-15 DIAGNOSIS — C786 Secondary malignant neoplasm of retroperitoneum and peritoneum: Secondary | ICD-10-CM | POA: Diagnosis not present

## 2019-11-15 LAB — CEA (IN HOUSE-CHCC): CEA (CHCC-In House): 40.48 ng/mL — ABNORMAL HIGH (ref 0.00–5.00)

## 2019-11-15 LAB — CBC WITH DIFFERENTIAL (CANCER CENTER ONLY)
Abs Immature Granulocytes: 0.23 10*3/uL — ABNORMAL HIGH (ref 0.00–0.07)
Basophils Absolute: 0.1 10*3/uL (ref 0.0–0.1)
Basophils Relative: 1 %
Eosinophils Absolute: 0.1 10*3/uL (ref 0.0–0.5)
Eosinophils Relative: 1 %
HCT: 34.1 % — ABNORMAL LOW (ref 36.0–46.0)
Hemoglobin: 11 g/dL — ABNORMAL LOW (ref 12.0–15.0)
Immature Granulocytes: 2 %
Lymphocytes Relative: 15 %
Lymphs Abs: 1.7 10*3/uL (ref 0.7–4.0)
MCH: 32.7 pg (ref 26.0–34.0)
MCHC: 32.3 g/dL (ref 30.0–36.0)
MCV: 101.5 fL — ABNORMAL HIGH (ref 80.0–100.0)
Monocytes Absolute: 0.9 10*3/uL (ref 0.1–1.0)
Monocytes Relative: 8 %
Neutro Abs: 8.3 10*3/uL — ABNORMAL HIGH (ref 1.7–7.7)
Neutrophils Relative %: 73 %
Platelet Count: 269 10*3/uL (ref 150–400)
RBC: 3.36 MIL/uL — ABNORMAL LOW (ref 3.87–5.11)
RDW: 18 % — ABNORMAL HIGH (ref 11.5–15.5)
WBC Count: 11.3 10*3/uL — ABNORMAL HIGH (ref 4.0–10.5)
nRBC: 0 % (ref 0.0–0.2)

## 2019-11-15 LAB — CMP (CANCER CENTER ONLY)
ALT: 10 U/L (ref 0–44)
AST: 14 U/L — ABNORMAL LOW (ref 15–41)
Albumin: 3.3 g/dL — ABNORMAL LOW (ref 3.5–5.0)
Alkaline Phosphatase: 127 U/L — ABNORMAL HIGH (ref 38–126)
Anion gap: 7 (ref 5–15)
BUN: 7 mg/dL — ABNORMAL LOW (ref 8–23)
CO2: 28 mmol/L (ref 22–32)
Calcium: 8.9 mg/dL (ref 8.9–10.3)
Chloride: 107 mmol/L (ref 98–111)
Creatinine: 0.8 mg/dL (ref 0.44–1.00)
GFR, Est AFR Am: >60 mL/min (ref >60)
GFR, Estimated: >60 mL/min (ref >60)
Glucose, Bld: 76 mg/dL (ref 70–99)
Potassium: 3.8 mmol/L (ref 3.5–5.1)
Sodium: 142 mmol/L (ref 135–145)
Total Bilirubin: 0.6 mg/dL (ref 0.3–1.2)
Total Protein: 6.6 g/dL (ref 6.5–8.1)

## 2019-11-15 MED ORDER — HEPARIN SOD (PORK) LOCK FLUSH 100 UNIT/ML IV SOLN
500.0000 [IU] | Freq: Once | INTRAVENOUS | Status: AC | PRN
  Administered 2019-11-15: 500 [IU] via INTRAVENOUS
  Filled 2019-11-15: qty 5

## 2019-11-15 MED ORDER — ONDANSETRON HCL 4 MG/2ML IJ SOLN
INTRAMUSCULAR | Status: AC
  Filled 2019-11-15: qty 4

## 2019-11-15 MED ORDER — SODIUM CHLORIDE 0.9% FLUSH
10.0000 mL | Freq: Once | INTRAVENOUS | Status: AC | PRN
  Administered 2019-11-15: 10 mL
  Filled 2019-11-15: qty 10

## 2019-11-15 MED ORDER — SODIUM CHLORIDE 0.9 % IV SOLN
Freq: Once | INTRAVENOUS | Status: AC
  Filled 2019-11-15: qty 250

## 2019-11-15 MED ORDER — SODIUM CHLORIDE 0.9% FLUSH
10.0000 mL | Freq: Once | INTRAVENOUS | Status: AC
  Administered 2019-11-15: 10 mL
  Filled 2019-11-15: qty 10

## 2019-11-15 MED ORDER — SODIUM CHLORIDE 0.9 % IV SOLN: Freq: Once | INTRAVENOUS | Status: DC

## 2019-11-15 MED ORDER — LONSURF 15-6.14 MG PO TABS: 35.0000 mg/m2 | ORAL_TABLET | Freq: Two times a day (BID) | ORAL | 0 refills | Status: DC

## 2019-11-15 MED ORDER — ONDANSETRON HCL 4 MG/2ML IJ SOLN
8.0000 mg | Freq: Once | INTRAMUSCULAR | Status: AC
  Administered 2019-11-15: 8 mg via INTRAVENOUS

## 2019-11-15 NOTE — Progress Notes (Signed)
DISCONTINUE ON PATHWAY REGIMEN - Colorectal     A cycle is every 14 days:     Bevacizumab-xxxx      Irinotecan      Leucovorin      Fluorouracil      Fluorouracil   **Always confirm dose/schedule in your pharmacy ordering system**  REASON: Disease Progression PRIOR TREATMENT: [id number]: FOLFIRI + Bevacizumab q14 Days TREATMENT RESPONSE: Partial Response (PR)  START ON PATHWAY REGIMEN - Colorectal     A cycle is 28 days:     Trifluridine and tipiracil   **Always confirm dose/schedule in your pharmacy ordering system**  Patient Characteristics: Distant Metastases, Nonsurgical Candidate, KRAS/NRAS Mutation Positive/Unknown (BRAF V600 Wild-Type/Unknown), Standard Cytotoxic Therapy, Third Line Standard Cytotoxic Therapy Tumor Location: Colon Therapeutic Status: Distant Metastases Microsatellite/Mismatch Repair Status: Unknown BRAF Mutation Status: Wild-Type (no mutation) KRAS/NRAS Mutation Status: Mutation Positive Standard Cytotoxic Line of Therapy: Third Building services engineer Cytotoxic Therapy Intent of Therapy: Non-Curative / Palliative Intent, Discussed with Patient

## 2019-11-15 NOTE — Telephone Encounter (Signed)
Oral Oncology Pharmacist Encounter  Received new prescription for Lonsurf (trifluridine-tipiracil) for the treatment of stage IV colon cancer in conjunction with bevacizumab, planned duration until disease progression or unacceptable drug toxicity. Planned start along with bevacizumab on 11/29/19.  CMP/CBC from 11/15/19 assessed, no relevant lab abnormalities. Prescription dose and frequency assessed.   Current medication list in Epic reviewed, no DDIs with Lonsurf identified.  Prescription has been e-scribed to the [facility] for benefits analysis and approval.  Oral Oncology Clinic will continue to follow for insurance authorization, copayment issues, initial counseling and start date.  Darl Pikes, PharmD, BCPS, BCOP, CPP Hematology/Oncology Clinical Pharmacist Practitioner [facility] [phone]  11/15/2019 1:29 PM

## 2019-11-15 NOTE — Patient Instructions (Signed)

## 2019-11-15 NOTE — Progress Notes (Signed)
START OFF PATHWAY REGIMEN - Colorectal   [id number]:Bevacizumab 7.5 mg/kg q21 days:   A cycle is every 21 days:     Bevacizumab-xxxx   **Always confirm dose/schedule in your pharmacy ordering system**  Patient Characteristics: Distant Metastases, Nonsurgical Candidate, KRAS/NRAS Mutation Positive/Unknown (BRAF V600 Wild-Type/Unknown), Standard Cytotoxic Therapy, Third Line Standard Cytotoxic Therapy Tumor Location: Colon Therapeutic Status: Distant Metastases Microsatellite/Mismatch Repair Status: Unknown BRAF Mutation Status: Wild-Type (no mutation) KRAS/NRAS Mutation Status: Mutation Positive Standard Cytotoxic Line of Therapy: Third Building services engineer Cytotoxic Therapy Intent of Therapy: Non-Curative / Palliative Intent, Discussed with Patient

## 2019-11-15 NOTE — Progress Notes (Signed)
Hematology and Oncology Follow Up Visit  Margaret Elliott [id number] 1951-03-02    11/15/19   Principle Diagnosis: 69 year old woman with colon cancer diagnosed in 2014.  She was found to have stage IV with hepatic involvement and subsequently peritoneal disease in 2017.  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   Prior Therapy:  She is status post laparoscopic laparotomy right hemicolectomy with ileocolonic anastomosis done on December 09, 2012.   FOLFOX and a Avastin chemotherapy started 01/18/2013. She is S/P 12 cycles completed in 05/2013.   She is a status post microwave ablation of metastatic lesion in the posterior segment of the right lobe of the liver completed on 09/28/2014 and repeated on 11/30/2014.  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  FOLFIRI and a Avastin salvage therapy started on 12/10/2015.  Therapy discontinued and maintained on 5-FU leucovorin with a Avastin only till March 2020.  FOLFIRI and a Avastin restarted on 10/05/2018.   She is status post 12 cycles of therapy.    Maintenance 5-FU, leucovorin and a Avastin maintenance restarted on October 7 of 2020.    Current therapy:    FOLFIRI and a Avastin restarted on September 06, 2019.  She is here for cycle 6 of therapy.   Interim History:  [name] is here for a return evaluation.  Since the last visit, she reports no major changes in her health.  She is experiencing more fatigue and nausea associated with chemotherapy.  She denies any vomiting or diarrhea.  She does report some weight loss or appetite changes.  Her performance status and quality of life remained labile.                  Medications: Updated on review. Current Outpatient Prescriptions  Medication Sig Dispense Refill  . acetaminophen (TYLENOL) 500 MG tablet Take 500 mg by mouth every 6 (six) hours as needed for  pain.      . diphenhydrAMINE (BENADRYL) 25 mg capsule Take 25-50 mg by mouth daily as needed for itching. For allergic reaction      . lidocaine-prilocaine (EMLA) cream Apply 1 application topically as needed. Apply approx 1/2 tsp to skin over port, prior to chemotherapy treatments  1 kit  3  . Multiple Vitamin (MULTIVITAMIN) tablet Take 1 tablet by mouth daily.      . ondansetron (ZOFRAN) 8 MG tablet Take 1 tablet (8 mg total) by mouth every 8 (eight) hours as needed for nausea.  30 tablet  1   No current facility-administered medications for this visit.     Allergies: No Known Allergies      Physical Exam:      Blood pressure (!) 147/89, pulse 82, temperature 97.9 F (36.6 C), temperature source Temporal, resp. rate 18, height '5\' 3"'$  (1.6 m), weight 127 lb 14.4 oz (58 kg), SpO2 100 %.    ECOG: 1     General appearance: Alert, awake without any distress. Head: Atraumatic without abnormalities Oropharynx: Without any thrush or ulcers. Eyes: No scleral icterus. Lymph nodes: No lymphadenopathy noted in the cervical, supraclavicular, or axillary nodes Heart:regular rate and rhythm, without any murmurs or gallops.   Lung: Clear to auscultation without any rhonchi, wheezes or dullness to percussion. Abdomin: Soft, nontender without any shifting dullness or ascites. Musculoskeletal: No clubbing or cyanosis. Neurological: No motor or sensory deficits. Skin: No rashes or lesions.  CBC    Component Value Date/Time   WBC 25.6 (H) 11/01/2019 0839   WBC 5.5 08/27/2019 0615   RBC 3.51 (L) 11/01/2019 0839   HGB 11.5 (L) 11/01/2019 0839   HGB 9.7 (L) 06/14/2019 1634   HGB 12.3 05/26/2017 0809   HCT 34.9 (L) 11/01/2019 0839   HCT 29.4 (L) 06/14/2019 1634   HCT 37.6 05/26/2017 0809   PLT 259 11/01/2019 0839   PLT 298 06/14/2019 1634   MCV 99.4 11/01/2019 0839   MCV 98 (H) 06/14/2019 1634   MCV 100.3 05/26/2017 0809   MCH 32.8  11/01/2019 0839   MCHC 33.0 11/01/2019 0839   RDW 19.0 (H) 11/01/2019 0839   RDW 15.7 (H) 06/14/2019 1634   RDW 17.4 (H) 05/26/2017 0809   LYMPHSABS 2.5 11/01/2019 0839   LYMPHSABS 1.2 06/14/2019 1634   LYMPHSABS 1.2 05/26/2017 0809   MONOABS 1.2 (H) 11/01/2019 0839   MONOABS 0.5 05/26/2017 0809   EOSABS 0.1 11/01/2019 0839   EOSABS 0.0 06/14/2019 1634   BASOSABS 0.0 11/01/2019 0839   BASOSABS 0.0 06/14/2019 1634   BASOSABS 0.0 05/26/2017 0809      CT ABDOMEN PELVIS FINDINGS  Hepatobiliary: Subcapsular lesion in the posterior liver measures 4.6 x 1.7 cm today compared to 4.9 x 2.0 cm previously. Small area of low attenuation in the anterior liver, adjacent to the falciform ligament, is in a characteristic location for focal fatty deposition. Gallbladder is distended without evidence of stone disease. No intrahepatic or extrahepatic biliary dilation.  Pancreas: No focal mass lesion. No dilatation of the main duct. No intraparenchymal cyst. No peripancreatic edema.  Spleen: No splenomegaly. No focal mass lesion.  Adrenals/Urinary Tract: No adrenal nodule or mass. Right renal cysts again noted. Tiny bilateral hypodensities in each kidney are too small to characterize. No evidence for hydroureter. The urinary bladder appears normal for the degree of distention.  Stomach/Bowel: Stomach is nondistended. Duodenum is normally positioned as is the ligament of Treitz. No small bowel wall thickening. No small bowel dilatation. Status post right hemicolectomy.  Vascular/Lymphatic: No abdominal aortic aneurysm. There is no gastrohepatic or hepatoduodenal ligament lymphadenopathy. No retroperitoneal or mesenteric lymphadenopathy. No pelvic sidewall lymphadenopathy.  Reproductive: The uterus is surgically absent. There is no adnexal mass.  Other: Small volume ascites. Omental cake in the left abdomen has progressed in the interval measuring 7.2 x 2.5 cm today  (64/2) compared to 3.1 x 1.3 cm previously.  Nodular soft tissue in the midline rectus sheath above the umbilicus measures 2.0 x 3.1 cm today compared to 2.4 x 2.6 cm previously.  Calcified omental disease deep to the umbilicus measures approximately 8.1 x 1.6 cm today compared to 9.3 x 2.1 cm previously.  Peritoneal thickening noted right pelvis (100/2) mildly progressive in the interval. Peritoneal thickening in the left paracolic gutter is visible on 69/2.  Musculoskeletal: No worrisome lytic or sclerotic osseous abnormality.  IMPRESSION: 1. No new or progressive findings in the chest to suggest metastatic involvement. 2. Slight continued progression of omental and peritoneal disease in the abdomen/pelvis. 3. No substantial change in ablation defect posterior right liver.        Impression and Plan:  69 year old woman with:   1.  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   Imaging studies obtained on Nov 10, 2019 were personally reviewed and showed progression of disease predominantly in the peritoneal area.  She has no hepatic or pulmonary metastasis  at this time.  Options of therapy moving forward were reviewed given her progression of disease.  His options would include regorafenib versus Lonsurf in combination with a Avastin.  Complication associated with both options were reviewed at this time and given the toxicities associated with regorafenib we have opted to proceed with Lonsurf at this time.  She will start at dose of 35 mg/m on day 1 to day 5 and 8-12 out of a 28-day cycle.  She will receive bevacizumab at 5 mg/kg every 2 weeks.  Complication associated with this medication including neutropenia, diarrhea, fatigue.  This combination has been shown to improve survival in patients with metastatic colorectal cancer reported at the ASCO gastrointestinal symposium in January 2021.  Progression  free survival was 9.2 months compared to Xeloda and a Avastin.  After discussion today she is agreeable to proceed.   2.  IV access: Port-A-Cath continues to be in use at this time.   3. Neuropathy: Stable without any changes.  4.  Abdominal pain: Manageable on hydrocodone.  5.  Nausea: She has some mild nausea today and will receive intravenous Zofran with hydration.  6.  Prognosis: Therapy remains incurable at this time although aggressive measures are warranted given her reasonable performance status.  7.  Anxiety: No changes in her mood noted at this time.  8.  Weight loss: Appetite is reasonable and her weight is slightly down likely related to cancer progression.  We will continue to monitor at this time.  9. Followup: She will continue to follow every 2 weeks for reevaluation and evaluation..   40 minutes were dedicated to this visit.  The time was spent on reviewing her disease status, discussing treatment options, discussing the the data in the literature behind the current treatment choice and managing future plan of care.   Zola Button MD 11/15/19

## 2019-11-15 NOTE — Telephone Encounter (Signed)
Oral Oncology Patient Advocate Encounter  Received notification from [facility] that prior authorization for [place] is required.  PA submitted on CoverMyMeds Key [id number] Status is pending  Oral Oncology Clinic will continue to follow.  [place] Patient [place] Phone [phone] Fax [phone] 11/15/2019 2:41 PM

## 2019-11-15 NOTE — Telephone Encounter (Signed)
Oral Oncology Patient Advocate Encounter  Prior Authorization for Margaret Elliott has been approved.    PA# [id number] Effective dates: 11/15/19 through further notice  Patients co-pay is $3003.18  Oral Oncology Clinic will continue to follow.   [name] Phone [phone] Fax [phone] 11/15/2019 3:10 PM

## 2019-11-16 ENCOUNTER — Telehealth: Payer: Self-pay | Admitting: Oncology

## 2019-11-16 MED ORDER — LONSURF 20-8.19 MG PO TABS: 35.0000 mg/m2 | ORAL_TABLET | Freq: Two times a day (BID) | ORAL | 0 refills | Status: AC

## 2019-11-16 NOTE — Telephone Encounter (Signed)
Scheduled appt per 5/26 los.  Left a vm of the appt date and time.

## 2019-11-17 ENCOUNTER — Other Ambulatory Visit: Payer: Self-pay | Admitting: Oncology

## 2019-11-17 ENCOUNTER — Inpatient Hospital Stay: Payer: Medicare Other

## 2019-11-21 ENCOUNTER — Telehealth: Payer: Self-pay

## 2019-11-21 NOTE — Telephone Encounter (Signed)
I have attempted to call patient several time and left voice mails. I am attempting to get patient manufacturer assistance for Lonsurf since copay is $3000. Patient will need to come in and sign manufacturer application and also bring proof of income- [id number] from tax return, social security statement.  [place] Patient [place] Phone [phone] Fax [phone] 11/21/2019 3:27 PM

## 2019-11-22 NOTE — Telephone Encounter (Signed)
Patient called back today and stated she decided to not take the [place].  [place] Patient [place] Phone [phone] Fax [phone] 11/22/2019 1:58 PM

## 2019-11-22 NOTE — Telephone Encounter (Signed)
Patient called back, she is scheduled to come by clinic today to sign application and bring financial documents.  [place] Patient [place] Phone [phone] Fax [phone] 11/22/2019 8:24 AM

## 2019-11-23 NOTE — Progress Notes (Signed)
Pharmacist Chemotherapy Monitoring - Follow Up Assessment    I verify that I have reviewed each item in the below checklist:  . Regimen for the patient is scheduled for the appropriate day and plan matches scheduled date. Marland Kitchen Appropriate non-routine labs are ordered dependent on drug ordered. . If applicable, additional medications reviewed and ordered per protocol based on lifetime cumulative doses and/or treatment regimen.   Plan for follow-up and/or issues identified: Yes . I-vent associated with next due treatment: Yes . MD and/or nursing notified: No  Margaret Elliott 11/23/2019 1:34 PM

## 2019-11-29 ENCOUNTER — Inpatient Hospital Stay: Payer: Medicare Other

## 2019-11-29 ENCOUNTER — Other Ambulatory Visit: Payer: Self-pay

## 2019-11-29 ENCOUNTER — Inpatient Hospital Stay: Payer: Medicare Other | Attending: Oncology

## 2019-11-29 ENCOUNTER — Inpatient Hospital Stay (HOSPITAL_BASED_OUTPATIENT_CLINIC_OR_DEPARTMENT_OTHER): Payer: Medicare Other | Admitting: Oncology

## 2019-11-29 VITALS — BP 131/88 | HR 92 | Resp 18

## 2019-11-29 VITALS — BP 134/41 | HR 111 | Temp 97.5°F | Resp 18 | Ht 63.0 in | Wt 118.6 lb

## 2019-11-29 DIAGNOSIS — C787 Secondary malignant neoplasm of liver and intrahepatic bile duct: Secondary | ICD-10-CM

## 2019-11-29 DIAGNOSIS — R110 Nausea: Secondary | ICD-10-CM | POA: Diagnosis not present

## 2019-11-29 DIAGNOSIS — C189 Malignant neoplasm of colon, unspecified: Secondary | ICD-10-CM | POA: Insufficient documentation

## 2019-11-29 DIAGNOSIS — K5900 Constipation, unspecified: Secondary | ICD-10-CM | POA: Diagnosis not present

## 2019-11-29 DIAGNOSIS — Z79899 Other long term (current) drug therapy: Secondary | ICD-10-CM | POA: Diagnosis not present

## 2019-11-29 DIAGNOSIS — R5383 Other fatigue: Secondary | ICD-10-CM | POA: Insufficient documentation

## 2019-11-29 DIAGNOSIS — R531 Weakness: Secondary | ICD-10-CM | POA: Diagnosis not present

## 2019-11-29 DIAGNOSIS — Z9221 Personal history of antineoplastic chemotherapy: Secondary | ICD-10-CM | POA: Diagnosis not present

## 2019-11-29 DIAGNOSIS — R109 Unspecified abdominal pain: Secondary | ICD-10-CM | POA: Diagnosis not present

## 2019-11-29 DIAGNOSIS — C786 Secondary malignant neoplasm of retroperitoneum and peritoneum: Secondary | ICD-10-CM | POA: Diagnosis not present

## 2019-11-29 DIAGNOSIS — Z95828 Presence of other vascular implants and grafts: Secondary | ICD-10-CM

## 2019-11-29 LAB — CMP (CANCER CENTER ONLY)
ALT: 11 U/L (ref 0–44)
AST: 18 U/L (ref 15–41)
Albumin: 3.4 g/dL — ABNORMAL LOW (ref 3.5–5.0)
Alkaline Phosphatase: 102 U/L (ref 38–126)
Anion gap: 10 (ref 5–15)
BUN: 21 mg/dL (ref 8–23)
CO2: 24 mmol/L (ref 22–32)
Calcium: 9.8 mg/dL (ref 8.9–10.3)
Chloride: 103 mmol/L (ref 98–111)
Creatinine: 0.95 mg/dL (ref 0.44–1.00)
GFR, Est AFR Am: >60 mL/min (ref >60)
GFR, Estimated: >60 mL/min (ref >60)
Glucose, Bld: 121 mg/dL — ABNORMAL HIGH (ref 70–99)
Potassium: 5 mmol/L (ref 3.5–5.1)
Sodium: 137 mmol/L (ref 135–145)
Total Bilirubin: 1.1 mg/dL (ref 0.3–1.2)
Total Protein: 7.8 g/dL (ref 6.5–8.1)

## 2019-11-29 LAB — CBC WITH DIFFERENTIAL (CANCER CENTER ONLY)
Abs Immature Granulocytes: 0.04 10*3/uL (ref 0.00–0.07)
Basophils Absolute: 0.1 10*3/uL (ref 0.0–0.1)
Basophils Relative: 1 %
Eosinophils Absolute: 0 10*3/uL (ref 0.0–0.5)
Eosinophils Relative: 0 %
HCT: 36.6 % (ref 36.0–46.0)
Hemoglobin: 12 g/dL (ref 12.0–15.0)
Immature Granulocytes: 0 %
Lymphocytes Relative: 10 %
Lymphs Abs: 1.1 10*3/uL (ref 0.7–4.0)
MCH: 33.3 pg (ref 26.0–34.0)
MCHC: 32.8 g/dL (ref 30.0–36.0)
MCV: 101.7 fL — ABNORMAL HIGH (ref 80.0–100.0)
Monocytes Absolute: 0.9 10*3/uL (ref 0.1–1.0)
Monocytes Relative: 8 %
Neutro Abs: 8.8 10*3/uL — ABNORMAL HIGH (ref 1.7–7.7)
Neutrophils Relative %: 81 %
Platelet Count: 358 10*3/uL (ref 150–400)
RBC: 3.6 MIL/uL — ABNORMAL LOW (ref 3.87–5.11)
RDW: 15.4 % (ref 11.5–15.5)
WBC Count: 10.9 10*3/uL — ABNORMAL HIGH (ref 4.0–10.5)
nRBC: 0 % (ref 0.0–0.2)

## 2019-11-29 LAB — CEA (IN HOUSE-CHCC): CEA (CHCC-In House): 28.43 ng/mL — ABNORMAL HIGH (ref 0.00–5.00)

## 2019-11-29 MED ORDER — HEPARIN SOD (PORK) LOCK FLUSH 100 UNIT/ML IV SOLN
500.0000 [IU] | Freq: Once | INTRAVENOUS | Status: AC | PRN
  Administered 2019-11-29: 500 [IU]
  Filled 2019-11-29: qty 5

## 2019-11-29 MED ORDER — ONDANSETRON HCL 4 MG/2ML IJ SOLN
8.0000 mg | Freq: Once | INTRAMUSCULAR | Status: AC
  Administered 2019-11-29: 8 mg via INTRAVENOUS

## 2019-11-29 MED ORDER — SODIUM CHLORIDE 0.9 % IV SOLN
Freq: Once | INTRAVENOUS | Status: AC
  Filled 2019-11-29: qty 250

## 2019-11-29 MED ORDER — SODIUM CHLORIDE 0.9% FLUSH
10.0000 mL | Freq: Once | INTRAVENOUS | Status: AC
  Administered 2019-11-29: 10 mL
  Filled 2019-11-29: qty 10

## 2019-11-29 MED ORDER — TRAMADOL HCL 50 MG PO TABS: 50.0000 mg | ORAL_TABLET | Freq: Four times a day (QID) | ORAL | 1 refills | Status: DC | PRN

## 2019-11-29 MED ORDER — SODIUM CHLORIDE 0.9% FLUSH
10.0000 mL | Freq: Once | INTRAVENOUS | Status: AC | PRN
  Administered 2019-11-29: 10 mL
  Filled 2019-11-29: qty 10

## 2019-11-29 MED ORDER — SENNOSIDES-DOCUSATE SODIUM 8.6-50 MG PO TABS: 1.0000 | ORAL_TABLET | Freq: Two times a day (BID) | ORAL | 1 refills | Status: AC

## 2019-11-29 MED ORDER — MEGESTROL ACETATE 400 MG/10ML PO SUSP
400.0000 mg | Freq: Every day | ORAL | 0 refills | Status: DC
Start: 2019-11-29 — End: 2019-12-23

## 2019-11-29 MED ORDER — SODIUM CHLORIDE 0.9 % IV SOLN: Freq: Once | INTRAVENOUS | Status: DC

## 2019-11-29 MED ORDER — ONDANSETRON HCL 4 MG/2ML IJ SOLN
INTRAMUSCULAR | Status: AC
  Filled 2019-11-29: qty 4

## 2019-11-29 NOTE — Patient Instructions (Signed)
Rehydration, Adult Rehydration is the replacement of body fluids and salts and minerals (electrolytes) that are lost during dehydration. Dehydration is when there is not enough fluid or water in the body. This happens when you lose more fluids than you take in. Common causes of dehydration include:  Vomiting.  Diarrhea.  Excessive sweating, such as from heat exposure or exercise.  Taking medicines that cause the body to lose excess fluid (diuretics).  Impaired kidney function.  Not drinking enough fluid.  Certain illnesses or infections.  Certain poorly controlled long-term (chronic) illnesses, such as diabetes, heart disease, and kidney disease.  Symptoms of mild dehydration may include thirst, dry lips and mouth, dry skin, and dizziness. Symptoms of severe dehydration may include increased heart rate, confusion, fainting, and not urinating. You can rehydrate by drinking certain fluids or getting fluids through an IV tube, as told by your health care provider. What are the risks? Generally, rehydration is safe. However, one problem that can happen is taking in too much fluid (overhydration). This is rare. If overhydration happens, it can cause an electrolyte imbalance, kidney failure, or a decrease in salt (sodium) levels in the body. How to rehydrate Follow instructions from your health care provider for rehydration. The kind of fluid you should drink and the amount you should drink depend on your condition.  If directed by your health care provider, drink an oral rehydration solution (ORS). This is a drink designed to treat dehydration that is found in pharmacies and retail stores. ? Make an ORS by following instructions on the package. ? Start by drinking small amounts, about  cup (120 mL) every 5-10 minutes. ? Slowly increase how much you drink until you have taken the amount recommended by your health care provider.  Drink enough clear fluids to keep your urine clear or pale  yellow. If you were instructed to drink an ORS, finish the ORS first, then start slowly drinking other clear fluids. Drink fluids such as: ? Water. Do not drink only water. Doing that can lead to having too little sodium in your body (hyponatremia). ? Ice chips. ? Fruit juice that you have added water to (diluted juice). ? Low-calorie sports drinks.  If you are severely dehydrated, your health care provider may recommend that you receive fluids through an IV tube in the hospital.  Do not take sodium tablets. Doing that can lead to the condition of having too much sodium in your body (hypernatremia). Eating while you rehydrate Follow instructions from your health care provider about what to eat while you rehydrate. Your health care provider may recommend that you slowly begin eating regular foods in small amounts.  Eat foods that contain a healthy balance of electrolytes, such as bananas, oranges, potatoes, tomatoes, and spinach.  Avoid foods that are greasy or contain a lot of fat or sugar.  In some cases, you may get nutrition through a feeding tube that is passed through your nose and into your stomach (nasogastric tube, or NG tube). This may be done if you have uncontrolled vomiting or diarrhea. Beverages to avoid Certain beverages may make dehydration worse. While you rehydrate, avoid:  Alcohol.  Caffeine.  Drinks that contain a lot of sugar. These include: ? High-calorie sports drinks. ? Fruit juice that is not diluted. ? Soda.  Check nutrition labels to see how much sugar or caffeine a beverage contains. Signs of dehydration recovery You may be recovering from dehydration if:  You are urinating more often than before you started   rehydrating.  Your urine is clear or pale yellow.  Your energy level improves.  You vomit less frequently.  You have diarrhea less frequently.  Your appetite improves or returns to normal.  You feel less dizzy or less light-headed.  Your  skin tone and color start to look more normal. Contact a health care provider if:  You continue to have symptoms of mild dehydration, such as: ? Thirst. ? Dry lips. ? Slightly dry mouth. ? Dry, warm skin. ? Dizziness.  You continue to vomit or have diarrhea. Get help right away if:  You have symptoms of dehydration that get worse.  You feel: ? Confused. ? Weak. ? Like you are going to faint.  You have not urinated in 6-8 hours.  You have very dark urine.  You have trouble breathing.  Your heart rate while sitting still is over 100 beats a minute.  You cannot drink fluids without vomiting.  You have vomiting or diarrhea that: ? Gets worse. ? Does not go away.  You have a fever. This information is not intended to replace advice given to you by your health care provider. Make sure you discuss any questions you have with your health care provider. Document Revised: 05/21/2017 Document Reviewed: 08/02/2015 Elsevier Patient Education  2020 Elsevier Inc.  Ondansetron injection What is this medicine? ONDANSETRON (on DAN se tron) is used to treat nausea and vomiting caused by chemotherapy. It is also used to prevent or treat nausea and vomiting after surgery. This medicine may be used for other purposes; ask your health care provider or pharmacist if you have questions. COMMON BRAND NAME(S): Zofran What should I tell my health care provider before I take this medicine? They need to know if you have any of these conditions:  heart disease  history of irregular heartbeat  liver disease  low levels of magnesium or potassium in the blood  an unusual or allergic reaction to ondansetron, granisetron, other medicines, foods, dyes, or preservatives  pregnant or trying to get pregnant  breast-feeding How should I use this medicine? This medicine is for infusion into a vein. It is given by a health care professional in a hospital or clinic setting. Talk to your  pediatrician regarding the use of this medicine in children. Special care may be needed. Overdosage: If you think you have taken too much of this medicine contact a poison control center or emergency room at once. NOTE: This medicine is only for you. Do not share this medicine with others. What if I miss a dose? This does not apply. What may interact with this medicine? Do not take this medicine with any of the following medications:  apomorphine  certain medicines for fungal infections like fluconazole, itraconazole, ketoconazole, posaconazole, voriconazole  cisapride  dronedarone  pimozide  thioridazine This medicine may also interact with the following medications:  carbamazepine  certain medicines for depression, anxiety, or psychotic disturbances  fentanyl  linezolid  MAOIs like Carbex, Eldepryl, Marplan, Nardil, and Parnate  methylene blue (injected into a vein)  other medicines that prolong the QT interval (cause an abnormal heart rhythm) like dofetilide, ziprasidone  phenytoin  rifampicin  tramadol This list may not describe all possible interactions. Give your health care provider a list of all the medicines, herbs, non-prescription drugs, or dietary supplements you use. Also tell them if you smoke, drink alcohol, or use illegal drugs. Some items may interact with your medicine. What should I watch for while using this medicine? Your condition will   be monitored carefully while you are receiving this medicine. What side effects may I notice from receiving this medicine? Side effects that you should report to your doctor or health care professional as soon as possible:  allergic reactions like skin rash, itching or hives, swelling of the face, lips, or tongue  breathing problems  confusion  dizziness  fast or irregular heartbeat  feeling faint or lightheaded, falls  fever and chills  loss of balance or coordination  seizures  sweating  swelling  of the hands and feet  tightness in the chest  tremors  unusually weak or tired Side effects that usually do not require medical attention (report to your doctor or health care professional if they continue or are bothersome):  constipation or diarrhea  headache This list may not describe all possible side effects. Call your doctor for medical advice about side effects. You may report side effects to FDA at 1-800-FDA-1088. Where should I keep my medicine? This drug is given in a hospital or clinic and will not be stored at home. NOTE: This sheet is a summary. It may not cover all possible information. If you have questions about this medicine, talk to your doctor, pharmacist, or health care provider.  2020 Elsevier/Gold Standard (2018-05-31 07:12:42)  

## 2019-11-29 NOTE — Progress Notes (Signed)
Hematology and Oncology Follow Up Visit  Margaret Elliott [id number] 1950/11/19    11/29/19   Principle Diagnosis: 69 year old woman with stage IV colon cancer with peritoneal involvement since 2017.  She presented with hepatic disease diagnosed in 2014.  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   Prior Therapy:  She is status post laparoscopic laparotomy right hemicolectomy with ileocolonic anastomosis done on December 09, 2012.   FOLFOX and a Avastin chemotherapy started 01/18/2013. She is S/P 12 cycles completed in 05/2013.   She is a status post microwave ablation of metastatic lesion in the posterior segment of the right lobe of the liver completed on 09/28/2014 and repeated on 11/30/2014.  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  FOLFIRI and a Avastin salvage therapy started on 12/10/2015.  Therapy discontinued and maintained on 5-FU leucovorin with a Avastin only till March 2020.  FOLFIRI and a Avastin restarted on 10/05/2018.   She is status post 12 cycles of therapy.    Maintenance 5-FU, leucovorin and a Avastin maintenance restarted on October 7 of 2020.    FOLFIRI and a Avastin restarted on September 06, 2019.  Therapy discontinued in May 2021 because of progression of disease.  Current therapy: Under consideration to start salvage therapy.      Interim History:  Margaret Elliott returns today for a repeat and follow-up.  Since the last visit, she reports more complaints of nausea, fatigue and more weight loss.  She has had issues with constipation as well and uses Senokot S periodically.  She denies any vomiting, hematochezia melena.  She still ambulating without any difficulties although she is developing more weakness overall.                  Medications: Reviewed without changes. Current Outpatient Prescriptions  Medication Sig Dispense Refill   . acetaminophen (TYLENOL) 500 MG tablet Take 500 mg by mouth every 6 (six) hours as needed for pain.      . diphenhydrAMINE (BENADRYL) 25 mg capsule Take 25-50 mg by mouth daily as needed for itching. For allergic reaction      . lidocaine-prilocaine (EMLA) cream Apply 1 application topically as needed. Apply approx 1/2 tsp to skin over port, prior to chemotherapy treatments  1 kit  3  . Multiple Vitamin (MULTIVITAMIN) tablet Take 1 tablet by mouth daily.      . ondansetron (ZOFRAN) 8 MG tablet Take 1 tablet (8 mg total) by mouth every 8 (eight) hours as needed for nausea.  30 tablet  1   No current facility-administered medications for this visit.     Allergies: No Known Allergies      Physical Exam:     Blood pressure (!) 134/41, pulse (!) 111, temperature (!) 97.5 F (36.4 C), temperature source Temporal, resp. rate 18, height '5\' 3"'$  (1.6 m), weight 118 lb 9.6 oz (53.8 kg), SpO2 99 %.      ECOG: 2     General appearance: Comfortable appearing without any discomfort Head: Normocephalic without any trauma Oropharynx: Mucous membranes are moist and pink without any thrush or ulcers. Eyes: Pupils are equal and round reactive to light. Lymph nodes: No cervical, supraclavicular, inguinal or axillary lymphadenopathy.   Heart:regular rate and rhythm.  S1 and S2 without leg edema. Lung: Clear without any rhonchi or wheezes.  No dullness to percussion. Abdomin: Soft, nontender, nondistended with  good bowel sounds.  No hepatosplenomegaly. Musculoskeletal: No joint deformity or effusion.  Full range of motion noted. Neurological: No deficits noted on motor, sensory and deep tendon reflex exam. Skin: No petechial rash or dryness.  Appeared moist.                         CBC    Component Value Date/Time   WBC 11.3 (H) 11/15/2019 1123   WBC 5.5 08/27/2019 0615   RBC 3.36 (L) 11/15/2019 1123   HGB 11.0 (L) 11/15/2019 1123   HGB 9.7 (L) 06/14/2019 1634    HGB 12.3 05/26/2017 0809   HCT 34.1 (L) 11/15/2019 1123   HCT 29.4 (L) 06/14/2019 1634   HCT 37.6 05/26/2017 0809   PLT 269 11/15/2019 1123   PLT 298 06/14/2019 1634   MCV 101.5 (H) 11/15/2019 1123   MCV 98 (H) 06/14/2019 1634   MCV 100.3 05/26/2017 0809   MCH 32.7 11/15/2019 1123   MCHC 32.3 11/15/2019 1123   RDW 18.0 (H) 11/15/2019 1123   RDW 15.7 (H) 06/14/2019 1634   RDW 17.4 (H) 05/26/2017 0809   LYMPHSABS 1.7 11/15/2019 1123   LYMPHSABS 1.2 06/14/2019 1634   LYMPHSABS 1.2 05/26/2017 0809   MONOABS 0.9 11/15/2019 1123   MONOABS 0.5 05/26/2017 0809   EOSABS 0.1 11/15/2019 1123   EOSABS 0.0 06/14/2019 1634   BASOSABS 0.1 11/15/2019 1123   BASOSABS 0.0 06/14/2019 1634   BASOSABS 0.0 05/26/2017 0809       IMPRESSION: 1. No new or progressive findings in the chest to suggest metastatic involvement. 2. Slight continued progression of omental and peritoneal disease in the abdomen/pelvis. 3. No substantial change in ablation defect posterior right liver.        Impression and Plan:  69 year old woman with:   1.  Colon cancer diagnosed in 2014.  She presented with hepatic involvement well as subsequently peritoneal disease in 2017.   The natural course of her disease was reviewed again and imaging studies from May 21 were discussed.  Her CEA continues to rise and imaging studies have indicated progression of disease.  Treatment options were reviewed again which include Lonsurf in combination with a Avastin versus regorafenib.   Lonsurf at the dose of 35 mg/m on day 1 to day 5 and 8-12 out of a 28-day cycle with bevacizumab at 5 mg/kg every 2 weeks were reviewed as well as the data that resulted in protocol of this regimen which include improvement in progression free survival.  After discussion today, she opted to defer anticancer treatment for the time being and would like to proceed with a brief treatment break.  She understands that this might result in further  decline and deterioration in her health because of cancer progression.   2.  IV access: Port-A-Cath remains in use without any issues.   3.  Abdominal pain: Related to peritoneal enlargement currently manageable on hydrocodone.  5.  Nausea: Antiemetics are available to her with less nausea and vomiting reported.  6.  Prognosis: Her disease is incurable although aggressive measures are warranted given her remained reasonable status.  Her performance status is declining however and it is possible that opportunity to change her cancer could be closed.  7.  Weight loss: We have discussed strategies to improve her nutritional intake including nutritional supplements and Megace.  8. Followup: Will be in the next 2 weeks to follow her progress.   30 minutes were spent on this encounter.  The time was dedicated to reviewing treatment options, discussing complications noted to therapy and future plan of care reviewed.   Zola Button MD 11/29/19

## 2019-11-29 NOTE — Addendum Note (Signed)
Addended by: Wyatt Portela on: 11/29/2019 12:34 PM   Modules accepted: Orders

## 2019-11-30 ENCOUNTER — Other Ambulatory Visit: Payer: Self-pay

## 2019-11-30 MED ORDER — ONDANSETRON HCL 8 MG PO TABS: ORAL_TABLET | ORAL | 0 refills | Status: AC

## 2019-12-07 ENCOUNTER — Telehealth: Payer: Self-pay | Admitting: *Deleted

## 2019-12-07 NOTE — Telephone Encounter (Signed)
Patient called to report some concerning symptoms and wanted to know if this is to be expected and if not what should she do about it.  Reports since stopping all treatment, this week she has had periods of extreme weakness, room spinning with sitting and laying down, foggy vision, balance is poor which contributed to a fall where she injured her back.  Reports only taking Tramadol 50 mg daily no Norco for the back pain from the fall.  Reports hydrating sufficiently, pushing food.  Reports having pressure in her ears for the past several days except today.    Routed to MD to advise.

## 2019-12-07 NOTE — Telephone Encounter (Signed)
I would encourage more hydration.  Your symptoms appear to be related to orthostasis and she needs to make sure she is hydrating properly.  She is encouraged to take hydrocodone if the pain increases.  She is to report any symptoms of diarrhea or fevers in the interim.

## 2019-12-07 NOTE — Telephone Encounter (Signed)
Attempted to reach patient to discuss Dr. Hazeline Junker recommendations in regards to her worsening symptoms.    Pending call back.

## 2019-12-12 ENCOUNTER — Telehealth: Payer: Self-pay | Admitting: *Deleted

## 2019-12-12 NOTE — Telephone Encounter (Signed)
Patient returned call from message left by RN on 6/17. Gave patient information/directions provided by Dr. Alen Blew regarding hydration, pain medications and symptoms to report.  Patient stated she is staying hydrated. Denies fever. Continues to have dizziness when standing up and walking. States she rarely takes Norco for pain - last taken 2 days ago.   She reports constipation for 2-3 days followed by 2-3 days diarrhea after taking laxatives.  States she takes following: Miralax daily, Docusate sodium 100 mg daily (sometimes takes  2 x day) and Colace 100 mg and also drinks prune juice. Advised patient to continue meds to avoid further episodes of constipation, but to call for prolonged episodes of diarrhea or any fever. Patient reminded of appt for labs and with Dr. Alen Blew 6/23. She verbalized understanding.

## 2019-12-13 ENCOUNTER — Other Ambulatory Visit: Payer: Self-pay

## 2019-12-13 ENCOUNTER — Inpatient Hospital Stay: Payer: Medicare Other

## 2019-12-13 ENCOUNTER — Inpatient Hospital Stay (HOSPITAL_BASED_OUTPATIENT_CLINIC_OR_DEPARTMENT_OTHER): Payer: Medicare Other | Admitting: Oncology

## 2019-12-13 VITALS — BP 103/79 | HR 94 | Temp 97.9°F | Resp 17 | Ht 63.0 in | Wt 116.2 lb

## 2019-12-13 DIAGNOSIS — C787 Secondary malignant neoplasm of liver and intrahepatic bile duct: Secondary | ICD-10-CM

## 2019-12-13 DIAGNOSIS — C189 Malignant neoplasm of colon, unspecified: Secondary | ICD-10-CM

## 2019-12-13 DIAGNOSIS — Z95828 Presence of other vascular implants and grafts: Secondary | ICD-10-CM

## 2019-12-13 LAB — CBC WITH DIFFERENTIAL (CANCER CENTER ONLY)
Abs Immature Granulocytes: 0.03 10*3/uL (ref 0.00–0.07)
Basophils Absolute: 0 10*3/uL (ref 0.0–0.1)
Basophils Relative: 0 %
Eosinophils Absolute: 0.1 10*3/uL (ref 0.0–0.5)
Eosinophils Relative: 1 %
HCT: 33.8 % — ABNORMAL LOW (ref 36.0–46.0)
Hemoglobin: 11 g/dL — ABNORMAL LOW (ref 12.0–15.0)
Immature Granulocytes: 0 %
Lymphocytes Relative: 9 %
Lymphs Abs: 0.9 10*3/uL (ref 0.7–4.0)
MCH: 32.7 pg (ref 26.0–34.0)
MCHC: 32.5 g/dL (ref 30.0–36.0)
MCV: 100.6 fL — ABNORMAL HIGH (ref 80.0–100.0)
Monocytes Absolute: 0.6 10*3/uL (ref 0.1–1.0)
Monocytes Relative: 6 %
Neutro Abs: 8.3 10*3/uL — ABNORMAL HIGH (ref 1.7–7.7)
Neutrophils Relative %: 84 %
Platelet Count: 434 10*3/uL — ABNORMAL HIGH (ref 150–400)
RBC: 3.36 MIL/uL — ABNORMAL LOW (ref 3.87–5.11)
RDW: 14.2 % (ref 11.5–15.5)
WBC Count: 10 10*3/uL (ref 4.0–10.5)
nRBC: 0 % (ref 0.0–0.2)

## 2019-12-13 LAB — CMP (CANCER CENTER ONLY)
ALT: 28 U/L (ref 0–44)
AST: 29 U/L (ref 15–41)
Albumin: 2.8 g/dL — ABNORMAL LOW (ref 3.5–5.0)
Alkaline Phosphatase: 182 U/L — ABNORMAL HIGH (ref 38–126)
Anion gap: 9 (ref 5–15)
BUN: 13 mg/dL (ref 8–23)
CO2: 25 mmol/L (ref 22–32)
Calcium: 9.5 mg/dL (ref 8.9–10.3)
Chloride: 97 mmol/L — ABNORMAL LOW (ref 98–111)
Creatinine: 0.83 mg/dL (ref 0.44–1.00)
GFR, Est AFR Am: >60 mL/min (ref >60)
GFR, Estimated: >60 mL/min (ref >60)
Glucose, Bld: 109 mg/dL — ABNORMAL HIGH (ref 70–99)
Potassium: 4.9 mmol/L (ref 3.5–5.1)
Sodium: 131 mmol/L — ABNORMAL LOW (ref 135–145)
Total Bilirubin: 0.5 mg/dL (ref 0.3–1.2)
Total Protein: 7.9 g/dL (ref 6.5–8.1)

## 2019-12-13 LAB — CEA (IN HOUSE-CHCC): CEA (CHCC-In House): 15.01 ng/mL — ABNORMAL HIGH (ref 0.00–5.00)

## 2019-12-13 MED ORDER — ONDANSETRON HCL 4 MG/2ML IJ SOLN
INTRAMUSCULAR | Status: AC
  Filled 2019-12-13: qty 4

## 2019-12-13 MED ORDER — MORPHINE SULFATE ER 30 MG PO TBCR: 30.0000 mg | EXTENDED_RELEASE_TABLET | Freq: Two times a day (BID) | ORAL | 0 refills | Status: DC

## 2019-12-13 MED ORDER — HEPARIN SOD (PORK) LOCK FLUSH 100 UNIT/ML IV SOLN
500.0000 [IU] | Freq: Once | INTRAVENOUS | Status: AC | PRN
  Administered 2019-12-13: 500 [IU]
  Filled 2019-12-13: qty 5

## 2019-12-13 MED ORDER — SODIUM CHLORIDE 0.9 % IV SOLN: Freq: Once | INTRAVENOUS | Status: DC

## 2019-12-13 MED ORDER — SODIUM CHLORIDE 0.9 % IV SOLN
Freq: Once | INTRAVENOUS | Status: AC
  Filled 2019-12-13: qty 250

## 2019-12-13 MED ORDER — MORPHINE SULFATE (PF) 2 MG/ML IV SOLN
2.0000 mg | Freq: Once | INTRAVENOUS | Status: AC
  Administered 2019-12-13: 2 mg via INTRAVENOUS

## 2019-12-13 MED ORDER — SODIUM CHLORIDE 0.9% FLUSH
10.0000 mL | Freq: Once | INTRAVENOUS | Status: AC | PRN
  Administered 2019-12-13: 10 mL
  Filled 2019-12-13: qty 10

## 2019-12-13 MED ORDER — MORPHINE SULFATE (PF) 2 MG/ML IV SOLN
INTRAVENOUS | Status: AC
  Filled 2019-12-13: qty 1

## 2019-12-13 MED ORDER — ONDANSETRON HCL 4 MG/2ML IJ SOLN
8.0000 mg | Freq: Once | INTRAMUSCULAR | Status: AC
  Administered 2019-12-13: 8 mg via INTRAVENOUS

## 2019-12-13 NOTE — Progress Notes (Signed)
Hematology and Oncology Follow Up Visit  Margaret Elliott [id number] 10-08-1950    12/13/19   Principle Diagnosis: 69 year old woman with colon cancer with peritoneal metastasis indicating stage IV disease since 2017.  She has NRAS mutated and MSI stable disease.  He was initially diagnosed in 2014 with hepatic involvement.   Prior Therapy:  She is status post laparoscopic laparotomy right hemicolectomy with ileocolonic anastomosis done on December 09, 2012.   FOLFOX and a Avastin chemotherapy started 01/18/2013. She is S/P 12 cycles completed in 05/2013.   She is a status post microwave ablation of metastatic lesion in the posterior segment of the right lobe of the liver completed on 09/28/2014 and repeated on 11/30/2014.  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  FOLFIRI and a Avastin salvage therapy started on 12/10/2015.  Therapy discontinued and maintained on 5-FU leucovorin with a Avastin only till March 2020.  FOLFIRI and a Avastin restarted on 10/05/2018.   She is status post 12 cycles of therapy.    Maintenance 5-FU, leucovorin and a Avastin maintenance restarted on October 7 of 2020.    FOLFIRI and a Avastin restarted on September 06, 2019.  Therapy discontinued in May 2021 because of progression of disease.  Current therapy: Supportive care and under consideration for different salvage therapy.      Interim History:  [name] is here for return evaluation.  Since the last visit, she reports further decline in her overall health.  She is reporting increased abdominal pain that has been more constant at this time.  Her pain is diffuse and described as sharp in the epigastric and lower parts of her abdomen.  She denies any nausea, vomiting and occasionally have constipation related to pain medication.  His use hydrocodone more frequently at this time although its not lasting enough to control her pain.  She  has reported waking up in the middle of the night with increased pain.                  Medications: Updated on review. Current Outpatient Prescriptions  Medication Sig Dispense Refill  . acetaminophen (TYLENOL) 500 MG tablet Take 500 mg by mouth every 6 (six) hours as needed for pain.      . diphenhydrAMINE (BENADRYL) 25 mg capsule Take 25-50 mg by mouth daily as needed for itching. For allergic reaction      . lidocaine-prilocaine (EMLA) cream Apply 1 application topically as needed. Apply approx 1/2 tsp to skin over port, prior to chemotherapy treatments  1 kit  3  . Multiple Vitamin (MULTIVITAMIN) tablet Take 1 tablet by mouth daily.      . ondansetron (ZOFRAN) 8 MG tablet Take 1 tablet (8 mg total) by mouth every 8 (eight) hours as needed for nausea.  30 tablet  1   No current facility-administered medications for this visit.     Allergies: No Known Allergies      Physical Exam:     Blood pressure 103/79, pulse 94, temperature 97.9 F (36.6 C), temperature source Temporal, resp. rate 17, height _0  (1.6 m), weight 116 lb 3.2 oz (52.7 kg), SpO2 100 %.        ECOG: 2    General appearance: Alert, awake without any distress. Head: Atraumatic without abnormalities Oropharynx: Without any thrush or ulcers. Eyes: No scleral icterus. Lymph nodes: No lymphadenopathy noted in the cervical, supraclavicular, or axillary nodes Heart:regular rate and  rhythm, without any murmurs or gallops.   Lung: Clear to auscultation without any rhonchi, wheezes or dullness to percussion. Abdomin: Soft, tender to touch with good bowel sounds.  Shifting dullness or ascites. Musculoskeletal: No clubbing or cyanosis. Neurological: No motor or sensory deficits. Skin: No rashes or lesions.                         CBC    Component Value Date/Time   WBC 10.0 12/13/2019 0855   WBC 5.5 08/27/2019 0615   RBC 3.36 (L) 12/13/2019 0855   HGB 11.0 (L)  12/13/2019 0855   HGB 9.7 (L) 06/14/2019 1634   HGB 12.3 05/26/2017 0809   HCT 33.8 (L) 12/13/2019 0855   HCT 29.4 (L) 06/14/2019 1634   HCT 37.6 05/26/2017 0809   PLT 434 (H) 12/13/2019 0855   PLT 298 06/14/2019 1634   MCV 100.6 (H) 12/13/2019 0855   MCV 98 (H) 06/14/2019 1634   MCV 100.3 05/26/2017 0809   MCH 32.7 12/13/2019 0855   MCHC 32.5 12/13/2019 0855   RDW 14.2 12/13/2019 0855   RDW 15.7 (H) 06/14/2019 1634   RDW 17.4 (H) 05/26/2017 0809   LYMPHSABS 0.9 12/13/2019 0855   LYMPHSABS 1.2 06/14/2019 1634   LYMPHSABS 1.2 05/26/2017 0809   MONOABS 0.6 12/13/2019 0855   MONOABS 0.5 05/26/2017 0809   EOSABS 0.1 12/13/2019 0855   EOSABS 0.0 06/14/2019 1634   BASOSABS 0.0 12/13/2019 0855   BASOSABS 0.0 06/14/2019 1634   BASOSABS 0.0 05/26/2017 0809         Impression and Plan:  69 year old woman with:   1.  Stage IV colon cancer with peritoneal involvement since 2017.     She is status post therapy outlined above with disease progression.  Her clinical status has been declining with increased abdominal pain, fatigue and worsening constipation and diarrhea.  Treatment options were reviewed at this time which includes continued supportive care only, different salvage therapy options including Lonsurf and regorafenib as approved agent. KRAS targeted therapy is off label use would also be a possibility given her NRAS mutation.  At this time she prefers to continue with supportive care only.  She is not sure she is able to handle any more cancer treatment for the time being.  She will reconsider that in the future after short treatment break.  She also understands the natural course of this disease that she might continue to get worse in the window of opportunity to treat her cancer might be.  She will receive IV hydration and antiemetics today and hold off any anticancer treatment for the time being.   2.  IV access: Port-A-Cath currently in use without any complications.    3.  Abdominal pain: Hydrocodone has been tenuous although has not been effective and lasting enough.  Risks and benefits of starting long-acting pain medication such as morphine was reviewed.  Complications that include constipation, somnolence among others were discussed.  She will start on morphine twice a day and use hydrocodone as breakthrough.  5.  Nausea: No nausea or vomiting reported at this time.  6.  Prognosis: Overall prognosis is poor given her overall decline including failure to thrive and weight loss as well as advancing malignancy.  This was discussed in detail with the patient and her husband today.  She understands that this disease will likely take over her body in the immediate future and likely will require hospice.  7.  Weight loss:  She was prescribed Megace although she has not been taking it.  We have discussed strategies to improve her weight including taking Megace.  8. Followup: In the next few weeks to follow her progress.   40 minutes were spent on this encounter.  The time was dedicated to discussing the natural course of her disease, overall prognosis, treatment options and addressing complications related to her cancer and cancer therapy.   Zola Button MD 12/13/19

## 2019-12-13 NOTE — Patient Instructions (Signed)

## 2019-12-13 NOTE — Patient Instructions (Signed)

## 2019-12-19 ENCOUNTER — Emergency Department (HOSPITAL_COMMUNITY): Payer: Medicare Other

## 2019-12-19 ENCOUNTER — Encounter (HOSPITAL_COMMUNITY): Payer: Self-pay | Admitting: *Deleted

## 2019-12-19 ENCOUNTER — Other Ambulatory Visit: Payer: Self-pay

## 2019-12-19 ENCOUNTER — Telehealth: Payer: Self-pay

## 2019-12-19 ENCOUNTER — Inpatient Hospital Stay (HOSPITAL_COMMUNITY)
Admission: EM | Admit: 2019-12-19 | Discharge: 2019-12-23 | DRG: 375 | Disposition: A | Discharge status: Hospice | Payer: Medicare Other | Attending: Internal Medicine | Admitting: Internal Medicine

## 2019-12-19 DIAGNOSIS — G8929 Other chronic pain: Secondary | ICD-10-CM | POA: Diagnosis present

## 2019-12-19 DIAGNOSIS — C786 Secondary malignant neoplasm of retroperitoneum and peritoneum: Principal | ICD-10-CM | POA: Diagnosis present

## 2019-12-19 DIAGNOSIS — Z20822 Contact with and (suspected) exposure to covid-19: Secondary | ICD-10-CM | POA: Diagnosis present

## 2019-12-19 DIAGNOSIS — Z825 Family history of asthma and other chronic lower respiratory diseases: Secondary | ICD-10-CM

## 2019-12-19 DIAGNOSIS — Z66 Do not resuscitate: Secondary | ICD-10-CM | POA: Diagnosis present

## 2019-12-19 DIAGNOSIS — E871 Hypo-osmolality and hyponatremia: Secondary | ICD-10-CM | POA: Diagnosis not present

## 2019-12-19 DIAGNOSIS — C189 Malignant neoplasm of colon, unspecified: Secondary | ICD-10-CM

## 2019-12-19 DIAGNOSIS — E861 Hypovolemia: Secondary | ICD-10-CM | POA: Diagnosis present

## 2019-12-19 DIAGNOSIS — Z0189 Encounter for other specified special examinations: Secondary | ICD-10-CM

## 2019-12-19 DIAGNOSIS — I700 Atherosclerosis of aorta: Secondary | ICD-10-CM | POA: Diagnosis not present

## 2019-12-19 DIAGNOSIS — C19 Malignant neoplasm of rectosigmoid junction: Secondary | ICD-10-CM | POA: Diagnosis present

## 2019-12-19 DIAGNOSIS — Z8249 Family history of ischemic heart disease and other diseases of the circulatory system: Secondary | ICD-10-CM

## 2019-12-19 DIAGNOSIS — K56609 Unspecified intestinal obstruction, unspecified as to partial versus complete obstruction: Secondary | ICD-10-CM

## 2019-12-19 DIAGNOSIS — Z9049 Acquired absence of other specified parts of digestive tract: Secondary | ICD-10-CM

## 2019-12-19 DIAGNOSIS — R339 Retention of urine, unspecified: Secondary | ICD-10-CM | POA: Diagnosis not present

## 2019-12-19 DIAGNOSIS — K5651 Intestinal adhesions [bands], with partial obstruction: Secondary | ICD-10-CM | POA: Diagnosis present

## 2019-12-19 DIAGNOSIS — K5903 Drug induced constipation: Secondary | ICD-10-CM | POA: Diagnosis not present

## 2019-12-19 DIAGNOSIS — M4316 Spondylolisthesis, lumbar region: Secondary | ICD-10-CM | POA: Diagnosis not present

## 2019-12-19 DIAGNOSIS — K7689 Other specified diseases of liver: Secondary | ICD-10-CM | POA: Diagnosis not present

## 2019-12-19 DIAGNOSIS — C787 Secondary malignant neoplasm of liver and intrahepatic bile duct: Secondary | ICD-10-CM | POA: Diagnosis present

## 2019-12-19 DIAGNOSIS — M47816 Spondylosis without myelopathy or radiculopathy, lumbar region: Secondary | ICD-10-CM | POA: Diagnosis not present

## 2019-12-19 DIAGNOSIS — Z6821 Body mass index (BMI) 21.0-21.9, adult: Secondary | ICD-10-CM

## 2019-12-19 DIAGNOSIS — Z9221 Personal history of antineoplastic chemotherapy: Secondary | ICD-10-CM

## 2019-12-19 DIAGNOSIS — R64 Cachexia: Secondary | ICD-10-CM | POA: Diagnosis present

## 2019-12-19 DIAGNOSIS — M47817 Spondylosis without myelopathy or radiculopathy, lumbosacral region: Secondary | ICD-10-CM | POA: Diagnosis not present

## 2019-12-19 DIAGNOSIS — D649 Anemia, unspecified: Secondary | ICD-10-CM | POA: Diagnosis present

## 2019-12-19 DIAGNOSIS — Z87891 Personal history of nicotine dependence: Secondary | ICD-10-CM

## 2019-12-19 DIAGNOSIS — Z515 Encounter for palliative care: Secondary | ICD-10-CM | POA: Diagnosis present

## 2019-12-19 LAB — LIPASE, BLOOD: Lipase: 23 U/L (ref 11–51)

## 2019-12-19 LAB — BASIC METABOLIC PANEL
Anion gap: 13 (ref 5–15)
BUN: 16 mg/dL (ref 8–23)
CO2: 25 mmol/L (ref 22–32)
Calcium: 9.1 mg/dL (ref 8.9–10.3)
Chloride: 89 mmol/L — ABNORMAL LOW (ref 98–111)
Creatinine, Ser: 0.75 mg/dL (ref 0.44–1.00)
GFR calc Af Amer: >60 mL/min (ref >60)
GFR calc non Af Amer: >60 mL/min (ref >60)
Glucose, Bld: 102 mg/dL — ABNORMAL HIGH (ref 70–99)
Potassium: 5 mmol/L (ref 3.5–5.1)
Sodium: 127 mmol/L — ABNORMAL LOW (ref 135–145)

## 2019-12-19 LAB — CBC
HCT: 32.1 % — ABNORMAL LOW (ref 36.0–46.0)
Hemoglobin: 10.4 g/dL — ABNORMAL LOW (ref 12.0–15.0)
MCH: 31.7 pg (ref 26.0–34.0)
MCHC: 32.4 g/dL (ref 30.0–36.0)
MCV: 97.9 fL (ref 80.0–100.0)
Platelets: 415 10*3/uL — ABNORMAL HIGH (ref 150–400)
RBC: 3.28 MIL/uL — ABNORMAL LOW (ref 3.87–5.11)
RDW: 14.2 % (ref 11.5–15.5)
WBC: 8.1 10*3/uL (ref 4.0–10.5)
nRBC: 0 % (ref 0.0–0.2)

## 2019-12-19 LAB — HEPATIC FUNCTION PANEL
ALT: 14 U/L (ref 0–44)
AST: 21 U/L (ref 15–41)
Albumin: 3.1 g/dL — ABNORMAL LOW (ref 3.5–5.0)
Alkaline Phosphatase: 115 U/L (ref 38–126)
Bilirubin, Direct: 0.1 mg/dL (ref 0.0–0.2)
Indirect Bilirubin: 0.5 mg/dL (ref 0.3–0.9)
Total Bilirubin: 0.6 mg/dL (ref 0.3–1.2)
Total Protein: 7.8 g/dL (ref 6.5–8.1)

## 2019-12-19 MED ORDER — SODIUM CHLORIDE (PF) 0.9 % IJ SOLN
INTRAMUSCULAR | Status: AC
  Filled 2019-12-19: qty 50

## 2019-12-19 MED ORDER — SODIUM CHLORIDE 0.9 % IV BOLUS
1000.0000 mL | Freq: Once | INTRAVENOUS | Status: AC
  Administered 2019-12-19: 1000 mL via INTRAVENOUS

## 2019-12-19 MED ORDER — MORPHINE SULFATE (PF) 4 MG/ML IV SOLN: 4.0000 mg | Freq: Once | INTRAVENOUS | Status: DC

## 2019-12-19 MED ORDER — IOHEXOL 300 MG/ML  SOLN
100.0000 mL | Freq: Once | INTRAMUSCULAR | Status: AC | PRN
  Administered 2019-12-19: 100 mL via INTRAVENOUS

## 2019-12-19 NOTE — ED Notes (Signed)
Bladder scan showed 54ml fluid.

## 2019-12-19 NOTE — ED Triage Notes (Signed)
Pt presents with c/o constipation, no bm since Sunday, also unable to urinate excluding a few drops at a time.Abd distention noted.

## 2019-12-19 NOTE — Telephone Encounter (Signed)
Please see message below sent to Dr. Alen Blew.   Mrs. Clyatt called and stated she had diarrhea through Sunday but has not had a BM since Sunday. She said she is drinking about 32 ounces of water daily and is only peeing out a small amount. She said she is barely eating because her stomach is hard and feels full. She said especially on the left side. She is taking MS Contin but says it wears off after 6 hours and she continues to hurt 9/10 pain. She said she made it through last night but doesn't think she can go through another 24 hours like this. She wants to know if you think she should go to the ED.  Dr. Alen Blew made aware and per Dr. Alen Blew patient should be evaluated in the ED. Patient made aware and verbalized understanding.
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
## 2019-12-20 ENCOUNTER — Inpatient Hospital Stay (HOSPITAL_COMMUNITY): Payer: Medicare Other

## 2019-12-20 ENCOUNTER — Encounter (HOSPITAL_COMMUNITY): Payer: Self-pay | Admitting: Internal Medicine

## 2019-12-20 DIAGNOSIS — C787 Secondary malignant neoplasm of liver and intrahepatic bile duct: Secondary | ICD-10-CM | POA: Diagnosis present

## 2019-12-20 DIAGNOSIS — G8929 Other chronic pain: Secondary | ICD-10-CM | POA: Diagnosis present

## 2019-12-20 DIAGNOSIS — K5651 Intestinal adhesions [bands], with partial obstruction: Secondary | ICD-10-CM | POA: Diagnosis present

## 2019-12-20 DIAGNOSIS — Z87891 Personal history of nicotine dependence: Secondary | ICD-10-CM | POA: Diagnosis not present

## 2019-12-20 DIAGNOSIS — R64 Cachexia: Secondary | ICD-10-CM | POA: Diagnosis present

## 2019-12-20 DIAGNOSIS — C786 Secondary malignant neoplasm of retroperitoneum and peritoneum: Secondary | ICD-10-CM | POA: Diagnosis present

## 2019-12-20 DIAGNOSIS — E871 Hypo-osmolality and hyponatremia: Secondary | ICD-10-CM | POA: Diagnosis present

## 2019-12-20 DIAGNOSIS — K56609 Unspecified intestinal obstruction, unspecified as to partial versus complete obstruction: Secondary | ICD-10-CM | POA: Diagnosis present

## 2019-12-20 DIAGNOSIS — Z825 Family history of asthma and other chronic lower respiratory diseases: Secondary | ICD-10-CM | POA: Diagnosis not present

## 2019-12-20 DIAGNOSIS — E861 Hypovolemia: Secondary | ICD-10-CM | POA: Diagnosis present

## 2019-12-20 DIAGNOSIS — Z515 Encounter for palliative care: Secondary | ICD-10-CM | POA: Diagnosis present

## 2019-12-20 DIAGNOSIS — K6389 Other specified diseases of intestine: Secondary | ICD-10-CM | POA: Diagnosis not present

## 2019-12-20 DIAGNOSIS — C19 Malignant neoplasm of rectosigmoid junction: Secondary | ICD-10-CM | POA: Diagnosis present

## 2019-12-20 DIAGNOSIS — C189 Malignant neoplasm of colon, unspecified: Secondary | ICD-10-CM | POA: Diagnosis not present

## 2019-12-20 DIAGNOSIS — K5903 Drug induced constipation: Secondary | ICD-10-CM | POA: Diagnosis present

## 2019-12-20 DIAGNOSIS — K5669 Other partial intestinal obstruction: Secondary | ICD-10-CM | POA: Diagnosis not present

## 2019-12-20 DIAGNOSIS — D649 Anemia, unspecified: Secondary | ICD-10-CM | POA: Diagnosis present

## 2019-12-20 DIAGNOSIS — R339 Retention of urine, unspecified: Secondary | ICD-10-CM | POA: Diagnosis present

## 2019-12-20 DIAGNOSIS — Z9221 Personal history of antineoplastic chemotherapy: Secondary | ICD-10-CM | POA: Diagnosis not present

## 2019-12-20 DIAGNOSIS — Z4682 Encounter for fitting and adjustment of non-vascular catheter: Secondary | ICD-10-CM | POA: Diagnosis not present

## 2019-12-20 DIAGNOSIS — R110 Nausea: Secondary | ICD-10-CM | POA: Diagnosis not present

## 2019-12-20 DIAGNOSIS — Z7189 Other specified counseling: Secondary | ICD-10-CM | POA: Diagnosis not present

## 2019-12-20 DIAGNOSIS — Z6821 Body mass index (BMI) 21.0-21.9, adult: Secondary | ICD-10-CM | POA: Diagnosis not present

## 2019-12-20 DIAGNOSIS — Z20822 Contact with and (suspected) exposure to covid-19: Secondary | ICD-10-CM | POA: Diagnosis present

## 2019-12-20 DIAGNOSIS — Z66 Do not resuscitate: Secondary | ICD-10-CM | POA: Diagnosis present

## 2019-12-20 DIAGNOSIS — G893 Neoplasm related pain (acute) (chronic): Secondary | ICD-10-CM | POA: Diagnosis not present

## 2019-12-20 DIAGNOSIS — Z9049 Acquired absence of other specified parts of digestive tract: Secondary | ICD-10-CM | POA: Diagnosis not present

## 2019-12-20 DIAGNOSIS — Z8249 Family history of ischemic heart disease and other diseases of the circulatory system: Secondary | ICD-10-CM | POA: Diagnosis not present

## 2019-12-20 LAB — BASIC METABOLIC PANEL
Anion gap: 12 (ref 5–15)
BUN: 14 mg/dL (ref 8–23)
CO2: 23 mmol/L (ref 22–32)
Calcium: 8.8 mg/dL — ABNORMAL LOW (ref 8.9–10.3)
Chloride: 93 mmol/L — ABNORMAL LOW (ref 98–111)
Creatinine, Ser: 0.69 mg/dL (ref 0.44–1.00)
GFR calc Af Amer: >60 mL/min (ref >60)
GFR calc non Af Amer: >60 mL/min (ref >60)
Glucose, Bld: 85 mg/dL (ref 70–99)
Potassium: 5 mmol/L (ref 3.5–5.1)
Sodium: 128 mmol/L — ABNORMAL LOW (ref 135–145)

## 2019-12-20 LAB — CBC
HCT: 30.2 % — ABNORMAL LOW (ref 36.0–46.0)
Hemoglobin: 9.9 g/dL — ABNORMAL LOW (ref 12.0–15.0)
MCH: 32.5 pg (ref 26.0–34.0)
MCHC: 32.8 g/dL (ref 30.0–36.0)
MCV: 99 fL (ref 80.0–100.0)
Platelets: 366 10*3/uL (ref 150–400)
RBC: 3.05 MIL/uL — ABNORMAL LOW (ref 3.87–5.11)
RDW: 14.3 % (ref 11.5–15.5)
WBC: 8.6 10*3/uL (ref 4.0–10.5)
nRBC: 0 % (ref 0.0–0.2)

## 2019-12-20 LAB — HEPATIC FUNCTION PANEL
ALT: 13 U/L (ref 0–44)
AST: 19 U/L (ref 15–41)
Albumin: 2.7 g/dL — ABNORMAL LOW (ref 3.5–5.0)
Alkaline Phosphatase: 111 U/L (ref 38–126)
Bilirubin, Direct: 0.1 mg/dL (ref 0.0–0.2)
Indirect Bilirubin: 0.7 mg/dL (ref 0.3–0.9)
Total Bilirubin: 0.8 mg/dL (ref 0.3–1.2)
Total Protein: 6.8 g/dL (ref 6.5–8.1)

## 2019-12-20 LAB — URINALYSIS, ROUTINE W REFLEX MICROSCOPIC
Bilirubin Urine: NEGATIVE
Glucose, UA: NEGATIVE mg/dL
Hgb urine dipstick: NEGATIVE
Ketones, ur: 5 mg/dL — AB
Leukocytes,Ua: NEGATIVE
Nitrite: NEGATIVE
Protein, ur: NEGATIVE mg/dL
Specific Gravity, Urine: 1.04 — ABNORMAL HIGH (ref 1.005–1.030)
pH: 5 (ref 5.0–8.0)

## 2019-12-20 LAB — OSMOLALITY, URINE: Osmolality, Ur: 337 mOsm/kg (ref 300–900)

## 2019-12-20 LAB — GLUCOSE, CAPILLARY
Glucose-Capillary: 81 mg/dL (ref 70–99)
Glucose-Capillary: 72 mg/dL (ref 70–99)
Glucose-Capillary: 53 mg/dL — ABNORMAL LOW (ref 70–99)
Glucose-Capillary: 77 mg/dL (ref 70–99)

## 2019-12-20 LAB — SODIUM, URINE, RANDOM: Sodium, Ur: 17 mmol/L

## 2019-12-20 LAB — SARS CORONAVIRUS 2 BY RT PCR (HOSPITAL ORDER, PERFORMED IN ~~LOC~~ HOSPITAL LAB): SARS Coronavirus 2: NEGATIVE

## 2019-12-20 MED ORDER — CHLORHEXIDINE GLUCONATE CLOTH 2 % EX PADS
6.0000 | MEDICATED_PAD | Freq: Every day | CUTANEOUS | Status: DC
  Administered 2019-12-20 – 2019-12-23 (×4): 6 via TOPICAL

## 2019-12-20 MED ORDER — ONDANSETRON HCL 4 MG/2ML IJ SOLN: 4.0000 mg | Freq: Four times a day (QID) | INTRAMUSCULAR | Status: DC | PRN

## 2019-12-20 MED ORDER — DEXTROSE-NACL 5-0.45 % IV SOLN: INTRAVENOUS | Status: DC

## 2019-12-20 MED ORDER — SODIUM CHLORIDE 0.9 % IV SOLN: INTRAVENOUS | Status: AC

## 2019-12-20 MED ORDER — SODIUM CHLORIDE 0.9% FLUSH
10.0000 mL | INTRAVENOUS | Status: DC | PRN
  Administered 2019-12-22 – 2019-12-23 (×2): 10 mL

## 2019-12-20 MED ORDER — SODIUM CHLORIDE 0.9% FLUSH
10.0000 mL | Freq: Two times a day (BID) | INTRAVENOUS | Status: DC
  Administered 2019-12-20 – 2019-12-23 (×4): 10 mL

## 2019-12-20 MED ORDER — PHENOL 1.4 % MT LIQD
1.0000 | OROMUCOSAL | Status: DC | PRN
  Administered 2019-12-20: 1 via OROMUCOSAL
  Filled 2019-12-20: qty 177

## 2019-12-20 MED ORDER — ONDANSETRON HCL 4 MG PO TABS: 4.0000 mg | ORAL_TABLET | Freq: Four times a day (QID) | ORAL | Status: DC | PRN

## 2019-12-20 MED ORDER — DEXTROSE 50 % IV SOLN
INTRAVENOUS | Status: AC
  Filled 2019-12-20: qty 50

## 2019-12-20 MED ORDER — MORPHINE SULFATE (PF) 2 MG/ML IV SOLN
2.0000 mg | INTRAVENOUS | Status: DC | PRN
  Administered 2019-12-20 – 2019-12-23 (×18): 2 mg via INTRAVENOUS
  Filled 2019-12-20 (×20): qty 1

## 2019-12-20 MED ORDER — DIATRIZOATE MEGLUMINE & SODIUM 66-10 % PO SOLN
90.0000 mL | Freq: Once | ORAL | Status: AC
  Administered 2019-12-20: 90 mL via NASOGASTRIC
  Filled 2019-12-20: qty 90

## 2019-12-20 NOTE — Plan of Care (Signed)
  Problem: Education: Goal: Knowledge of General Education information will improve Description: Including pain rating scale, medication(s)/side effects and non-pharmacologic comfort measures Outcome: Progressing   Problem: Health Behavior/Discharge Planning: Goal: Ability to manage health-related needs will improve Outcome: Progressing   Problem: Clinical Measurements: Goal: Ability to maintain clinical measurements within normal limits will improve Outcome: Progressing Goal: Will remain free from infection Outcome: Progressing Goal: Diagnostic test results will improve Outcome: Progressing Goal: Cardiovascular complication will be avoided Outcome: Progressing   Problem: Activity: Goal: Risk for activity intolerance will decrease Outcome: Progressing   Problem: Nutrition: Goal: Adequate nutrition will be maintained Outcome: Progressing   Problem: Elimination: Goal: Will not experience complications related to bowel motility Outcome: Progressing   Problem: Pain Managment: Goal: General experience of comfort will improve Outcome: Progressing   Problem: Safety: Goal: Ability to remain free from injury will improve Outcome: Progressing   Problem: Skin Integrity: Goal: Risk for impaired skin integrity will decrease Outcome: Progressing   

## 2019-12-20 NOTE — Progress Notes (Signed)
Patient administered gastrografin sent by pharmacy by NGT. Diluted with sterile water by this nurse. Patient tolerated well- tube clamped according to order.

## 2019-12-20 NOTE — Progress Notes (Signed)
Patient NGt repositioned out 5 cm as requested by Barkley Boards PA. Patient tolerated repositioning of NgT well. Applied new dressing to secure NGT. Call bell within reach.

## 2019-12-20 NOTE — Progress Notes (Signed)
Events noted.  Patient hospitalized overnight for nausea, vomiting and found to have high-grade small bowel obstruction on imaging studies.  CT scan was personally reviewed which showed small bowel obstruction and disease progression likely contributing to her obstruction.  She is currently receiving supportive management with NG suction and IV hydration.  Surgery opinion is currently pending.  Impression and recommendation She has a progressive colorectal malignancy with omental and hepatic metastasis.  She has progressed on multiple therapies and unlikely to receive any additional treatment for her cancer given the progression of disease.  Her prognosis from her cancer is overall poor.  Palliative surgical intervention is recommended, I would not object to that at this point but conservative measures are preferred at this time given her overall prognosis.

## 2019-12-20 NOTE — Progress Notes (Signed)
PATIENT NGT LIS RESTARTED AFTER BEING OFF OF SUCTION PER GASTROGRAFIN PROTOCOL.

## 2019-12-20 NOTE — Progress Notes (Signed)
PROGRESS NOTE    Margaret Elliott  KZS:[id number] DOB: 1950/09/30 DOA: 12/19/2019 PCP: Minette Brine, FNP   Brief Narrative:  Margaret Elliott is a 69 y.o. female with history of colon cancer being followed by Alen Blew recently discontinued chemotherapy due to progression of disease considering salvage therapy with previous history of right hemicolectomy and anastomosis presents to the EDR because of worsening abdominal pain distention with normal bowel movement for the last 2 days.  Started vomiting today.  Denies any fever chills chest pain or shortness of breath. In the ED patient CT abdomen pelvis is consistent with small bowel obstruction either due to ideations or omental metastasis.  In addition it also shows progression of the metastatic disease.  ER physician discussed with on-call general surgeon Dr. Ninfa Linden who advised NG tube suction and will be seeing patient in consult.  Labs are remarkable for sodium of 127.  Albumin 3.1 hemoglobin 10.4.  Covid test was negative.   Assessment & Plan:   Principal Problem:   SBO (small bowel obstruction) (HCC) Active Problems:   Hyponatremia   Anemia  Small bowel obstruction either due to adhesions vs omental metastasis  - History of colon cancer being followed by Dr. Alen Blew oncologist - NG tube repositioned this morning with improvement in output - General surgery following, given findings likely nonsurgical patient would recommend palliative gastrostomy tube if indicated/no improvement in SBO -Small bowel follow-through series pending  Hypovolemic hyponatremia.   - Improving with IV fluids -Continue to follow morning labs  Anemia appears to be chronic likely related to malignancy.  -Stable, likely at baseline, follow repeat labs.  Chronic pain in the setting of above -Continue home medications, presently on IV morphine for breakthrough pain as well.   DVT prophylaxis: SCDs perioperatively Code Status: Full code Family Communication:  None present  Status is: Inpatient  Dispo: The patient is from: Home              Anticipated d/c is to: To be determined              Anticipated d/c date is: 72 to 96 hours              Patient currently not medically stable for discharge to need for ongoing symptom control, possible need for procedure or surgery pending clinical course  Consultants:   General surgery, oncology  Procedures:   None planned currently  Antimicrobials:  None indicated  Subjective: No acute issues or events overnight, abdominal pain nausea have drastically improved with current regimen.  Denies chest pain, shortness of breath, headache, fevers, chills.  Objective: Vitals:   12/20/19 0140 12/20/19 0142 12/20/19 0227 12/20/19 0628  BP: 128/89 127/88 124/82 129/86  Pulse: 87 85 84 88  Resp:  (!) 21 18 15   Temp:   98.6 F (37 C) 98.6 F (37 C)  TempSrc:   Oral Oral  SpO2: 100% 100% 100% 100%  Weight:   56.1 kg   Height:   5\' 4"  (1.626 m)     Intake/Output Summary (Last 24 hours) at 12/20/2019 0724 Last data filed at 12/20/2019 0700 Gross per 24 hour  Intake 100.72 ml  Output 650 ml  Net -549.28 ml   Filed Weights   12/19/19 1740 12/20/19 0227  Weight: 52.6 kg 56.1 kg    Examination:  General:  Pleasantly resting in bed, No acute distress. HEENT:  Normocephalic atraumatic.  Sclerae nonicteric, noninjected.  Extraocular movements intact bilaterally. Neck:  Without mass or deformity.  Trachea is midline. Lungs:  Clear to auscultate bilaterally without rhonchi, wheeze, or rales. Heart:  Regular rate and rhythm.  Without murmurs, rubs, or gallops. Abdomen:  Soft, minimally tender, nondistended Extremities: Without cyanosis, clubbing, edema, or obvious deformity. Vascular:  Dorsalis pedis and posterior tibial pulses palpable bilaterally. Skin:  Warm and dry, no erythema, no ulcerations.  Data Reviewed: I have personally reviewed following labs and imaging studies  CBC: Recent Labs    Lab Dec 20, 2019 0855 12/19/19 2212 12/20/19 0420  WBC 10.0 8.1 8.6  NEUTROABS 8.3*  --   --   HGB 11.0* 10.4* 9.9*  HCT 33.8* 32.1* 30.2*  MCV 100.6* 97.9 99.0  PLT 434* 415* 409   Basic Metabolic Panel: Recent Labs  Lab 12/20/2019 0855 12/19/19 2212 12/20/19 0420  NA 131* 127* 128*  K 4.9 5.0 5.0  CL 97* 89* 93*  CO2 25 25 23   GLUCOSE 109* 102* 85  BUN 13 16 14   CREATININE 0.83 0.75 0.69  CALCIUM 9.5 9.1 8.8*   GFR: Estimated Creatinine Clearance: 57.3 mL/min (by C-G formula based on SCr of 0.69 mg/dL). Liver Function Tests: Recent Labs  Lab December 20, 2019 0855 12/19/19 2212 12/20/19 0420  AST 29 21 19   ALT 28 14 13   ALKPHOS 182* 115 111  BILITOT 0.5 0.6 0.8  PROT 7.9 7.8 6.8  ALBUMIN 2.8* 3.1* 2.7*   Recent Labs  Lab 12/19/19 2212  LIPASE 23   No results for input(s): AMMONIA in the last 168 hours. Coagulation Profile: No results for input(s): INR, PROTIME in the last 168 hours. Cardiac Enzymes: No results for input(s): CKTOTAL, CKMB, CKMBINDEX, TROPONINI in the last 168 hours. BNP (last 3 results) No results for input(s): PROBNP in the last 8760 hours. HbA1C: No results for input(s): HGBA1C in the last 72 hours. CBG: Recent Labs  Lab 12/20/19 0515  GLUCAP 81   Lipid Profile: No results for input(s): CHOL, HDL, LDLCALC, TRIG, CHOLHDL, LDLDIRECT in the last 72 hours. Thyroid Function Tests: No results for input(s): TSH, T4TOTAL, FREET4, T3FREE, THYROIDAB in the last 72 hours. Anemia Panel: No results for input(s): VITAMINB12, FOLATE, FERRITIN, TIBC, IRON, RETICCTPCT in the last 72 hours. Sepsis Labs: No results for input(s): PROCALCITON, LATICACIDVEN in the last 168 hours.  Recent Results (from the past 240 hour(s))  SARS Coronavirus 2 by RT PCR (hospital order, performed in [facility] hospital lab) Nasopharyngeal Nasopharyngeal Swab     Status: None   Collection Time: 12/20/19  1:30 AM   Specimen: Nasopharyngeal Swab  Result Value Ref Range Status    SARS Coronavirus 2 NEGATIVE NEGATIVE Final    Comment: (NOTE) SARS-CoV-2 target nucleic acids are NOT DETECTED.  The SARS-CoV-2 RNA is generally detectable in upper and lower respiratory specimens during the acute phase of infection. The lowest concentration of SARS-CoV-2 viral copies this assay can detect is 250 copies / mL. A negative result does not preclude SARS-CoV-2 infection and should not be used as the sole basis for treatment or other patient management decisions.  A negative result may occur with improper specimen collection / handling, submission of specimen other than nasopharyngeal swab, presence of viral mutation(s) within the areas targeted by this assay, and inadequate number of viral copies (<250 copies / mL). A negative result must be combined with clinical observations, patient history, and epidemiological information.  Fact Sheet for Patients:   StrictlyIdeas.no  Fact Sheet for Healthcare Providers: BankingDealers.co.za  This test is not yet approved or  cleared by the [place]  FDA and has been authorized for detection and/or diagnosis of SARS-CoV-2 by FDA under an Emergency Use Authorization (EUA).  This EUA will remain in effect (meaning this test can be used) for the duration of the COVID-19 declaration under Section 564(b)(1) of the Act, 21 U.S.C. section 360bbb-3(b)(1), unless the authorization is terminated or revoked sooner.  Performed at [facility], [place] [address] [place], [place] [address]     Radiology Studies: DG Abdomen 1 View  Result Date: 12/20/2019 CLINICAL DATA:  Enteric catheter placement EXAM: ABDOMEN - 1 VIEW COMPARISON:  12/19/2019 FINDINGS: Supine frontal view of the abdomen and pelvis demonstrates enteric catheter tip and side port projecting over proximal duodenum. Gaseous distention of the small bowel consistent with obstruction, unchanged. Excreted contrast  within the kidneys, ureters, and bladder. IMPRESSION: 1. Enteric catheter overlying proximal duodenum. 2. Continued small bowel obstruction. Electronically Signed   By: Randa Ngo M.D.   On: 12/20/2019 02:19   CT ABDOMEN PELVIS W CONTRAST  Result Date: 12/19/2019 CLINICAL DATA:  Metastatic colon cancer, abdominal pain and distension EXAM: CT ABDOMEN AND PELVIS WITH CONTRAST TECHNIQUE: Multidetector CT imaging of the abdomen and pelvis was performed using the standard protocol following bolus administration of intravenous contrast. CONTRAST:  158mL OMNIPAQUE IOHEXOL 300 MG/ML  SOLN COMPARISON:  11/10/2019 FINDINGS: Lower chest: Minimal hypoventilatory changes at the lung bases. No acute pleural or parenchymal lung disease. Hepatobiliary: Stable hypodensity posterior right lobe liver consistent with previous ablation. This measures approximately 4.7 x 2.1 cm on today's exam. There are new subcapsular hypodensities within the right lobe liver worrisome for disease progression. On image 18 there is a 1.5 cm hypodensity and image 37 there is a 2 cm hypodensity. The gallbladder is unremarkable. Pancreas: Unremarkable. No pancreatic ductal dilatation or surrounding inflammatory changes. Spleen: Normal in size without focal abnormality. Adrenals/Urinary Tract: Stable right renal cysts. No urinary tract calculi or obstruction. Bladder is unremarkable. The adrenals are normal. Stomach/Bowel: There is marked distension of the jejunum, measuring up to 3.9 cm in diameter. Transition within the lower pelvis to normal caliber distal jejunum and ileum. Gas and stool are seen within the colon. Vascular/Lymphatic: Aortic atherosclerosis. No pathologic adenopathy is identified. Reproductive: Status post hysterectomy. No adnexal masses. Other: Small volume ascites within the lower abdomen and pelvis, increased since prior study. No free intra-abdominal gas. Enlarging mesenteric metastatic disease is noted. Left upper quadrant  omental mass measures approximately 11.1 x 2.2 cm. Soft tissue mass in the rectus sheath measures 4.1 x 2.3 cm. Peritoneal thickening in the right lower pelvis is again noted, not significantly changed. Musculoskeletal: Postsurgical changes right hip. No acute or destructive bony lesions. Reconstructed images demonstrate no additional findings. IMPRESSION: 1. High-grade small bowel obstruction, with transition point in the lower pelvis. This could be due to adhesions or omental metastases. 2. Progression of metastatic disease, with multiple new liver lesions and enlarging omental and abdominal wall metastases. 3. Small volume ascites within the lower abdomen and pelvis, increased since prior study. 4. Aortic Atherosclerosis (ICD10-I70.0). Electronically Signed   By: Randa Ngo M.D.   On: 12/19/2019 23:49   CT L-SPINE NO CHARGE  Result Date: 12/19/2019 CLINICAL DATA:  Metastatic colon cancer, abdominal pain and distension EXAM: CT LUMBAR SPINE WITHOUT CONTRAST TECHNIQUE: Multidetector CT imaging of the lumbar spine was performed without intravenous contrast administration. Multiplanar CT image reconstructions were also generated. COMPARISON:  08/27/2019 FINDINGS: Segmentation: 5 lumbar type vertebrae. Alignment: Minimal grade 1 anterolisthesis of L4 on L5. No  pars defects. Otherwise alignment is anatomic. Vertebrae: No acute or destructive bony lesions. Paraspinal and other soft tissues: Paraspinal soft tissues are unremarkable. Please refer to CT abdomen report describing high-grade small bowel obstruction and progressive metastatic colon cancer. Disc levels: Marked facet hypertrophy at L4-5 and L5-S1. Prominent spondylosis at L5-S1. IMPRESSION: 1. Lower lumbar spondylosis and facet hypertrophy. No acute or destructive bony lesion. Electronically Signed   By: Randa Ngo M.D.   On: 12/19/2019 23:51    Scheduled Meds: . Chlorhexidine Gluconate Cloth  6 each Topical Daily  . sodium chloride (PF)        . sodium chloride flush  10-40 mL Intracatheter Q12H   Continuous Infusions: . sodium chloride 50 mL/hr at 12/20/19 0454     LOS: 0 days   Time spent: 65min  Jess Toney C Elzada Pytel, DO [facility]  If 7PM-7AM, please contact night-coverage [email or web address]  12/20/2019, 7:24 AM

## 2019-12-20 NOTE — H&P (Signed)
History and Physical    Margaret Elliott JGG:[id number] DOB: 04-Jul-1950 DOA: 12/19/2019  PCP: Minette Brine, FNP  Patient coming from: Home.  Chief Complaint: Abdominal pain.  HPI: Margaret Elliott is a 69 y.o. female with history of colon cancer being followed by Alen Blew recently discontinued chemotherapy due to progression of disease considering salvage therapy with previous history of right hemicolectomy and anastomosis presents to the ER because of worsening abdominal pain distention with normal bowel movement for the last 2 days.  Started vomiting today.  Denies any fever chills chest pain or shortness of breath.  ED Course: In the ER patient CT abdomen pelvis is consistent with small bowel obstruction either due to ideations or omental metastasis.  In addition it also shows progression of the metastatic disease.  ER physician discussed with on-call general surgeon Dr. Ninfa Linden who advised NG tube suction and will be seeing patient in consult.  Labs are remarkable for sodium of 127.  Albumin 3.1 hemoglobin 10.4.  Covid test was negative.  Review of Systems: As per HPI, rest all negative.   Past Medical History:  Diagnosis Date  . Anemia   . Closed right hip fracture ([place]) 06/06/2019  . colon ca dx'd 11/2012  . History of chemotherapy     Past Surgical History:  Procedure Laterality Date  . ABDOMINAL HYSTERECTOMY    . ABDOMINAL HYSTERECTOMY    . ablation of liver      09/2014  . APPENDECTOMY N/A 12/09/2012   Procedure: APPENDECTOMY;  Surgeon: Ralene Ok, MD;  Location: [place];  Service: General;  Laterality: N/A;  . GANGLION CYST EXCISION    . INTRAMEDULLARY (IM) NAIL INTERTROCHANTERIC Right 06/07/2019   Procedure: INTRAMEDULLARY (IM) NAIL INTERTROCHANTRIC;  Surgeon: Rod Can, MD;  Location: [place];  Service: Orthopedics;  Laterality: Right;  . IR GENERIC HISTORICAL  08/09/2014   IR RADIOLOGIST EVAL & MGMT 08/09/2014 Sandi Mariscal, MD [facility] INTERV RAD  . LAPAROSCOPY N/A  12/09/2012   Procedure: LAPAROSCOPY DIAGNOSTIC;  Surgeon: Ralene Ok, MD;  Location: [place];  Service: General;  Laterality: N/A;  . PARTIAL COLECTOMY Right 12/09/2012   Procedure:  Right Hemi-colectomy, Resection of Distal Ileum;  Surgeon: Ralene Ok, MD;  Location: [place];  Service: General;  Laterality: Right;  . PORTACATH PLACEMENT Left 01/04/2013   Procedure: INSERTION PORT-A-CATH;  Surgeon: Ralene Ok, MD;  Location: [place];  Service: General;  Laterality: Left;  . TUBAL LIGATION       reports that she quit smoking about 47 years ago. Her smoking use included cigarettes. She has a 1.00 pack-year smoking history. She has never used smokeless tobacco. She reports current alcohol use. She reports that she does not use drugs.  No Known Allergies  Family History  Problem Relation Age of Onset  . Heart disease Mother   . COPD Mother     Prior to Admission medications   Medication Sig Start Date End Date Taking? Authorizing Provider  calcium-vitamin D (OSCAL WITH D) 500-200 MG-UNIT tablet Take 1 tablet by mouth daily with breakfast. 06/10/19  Yes Autry-Lott, Naaman Plummer, DO  diphenoxylate-atropine (LOMOTIL) 2.5-0.025 MG tablet Take 1 tablet by mouth 4 (four) times daily as needed for diarrhea or loose stools. 11/10/19  Yes Wyatt Portela, MD  docusate sodium (COLACE) 100 MG capsule Take 1 capsule (100 mg total) by mouth every 12 (twelve) hours. Patient taking differently: Take 100 mg by mouth daily as needed for mild constipation.  08/27/19  Yes Sherwood Gambler, MD  HYDROcodone-acetaminophen (NORCO) 5-325 MG tablet Take 1 tablet by mouth every 6 (six) hours as needed for moderate pain. Patient taking differently: Take 1 tablet by mouth every 6 (six) hours as needed for moderate pain or severe pain.  10/18/19  Yes Wyatt Portela, MD  lidocaine-prilocaine (EMLA) cream Apply 1 application topically as needed. Apply approx 1/2 tsp to skin over port, prior to chemotherapy treatments Patient  taking differently: Apply 1 application topically as needed (access port). Apply approx 1/2 tsp to skin over port, prior to chemotherapy treatments 05/24/19  Yes Shadad, Mathis Dad, MD  LORazepam (ATIVAN) 1 MG tablet Take 1 tablet (1 mg total) by mouth every 6 (six) hours as needed for sleep. 09/06/19  Yes Wyatt Portela, MD  megestrol (MEGACE) 400 MG/10ML suspension Take 10 mLs (400 mg total) by mouth daily. 11/29/19  Yes Wyatt Portela, MD  morphine (MS CONTIN) 30 MG 12 hr tablet Take 1 tablet (30 mg total) by mouth every 12 (twelve) hours. 12/13/19  Yes Wyatt Portela, MD  Multiple Vitamin (MULTIVITAMIN ADULT PO) Take 1 tablet by mouth daily.   Yes [provider]  ondansetron (ZOFRAN) 8 MG tablet TAKE 1 TABLET BY MOUTH 3 TIMES A DAY AS NEEDED FOR NAUSEA Patient taking differently: Take 8 mg by mouth every 8 (eight) hours as needed for nausea or vomiting. TAKE 1 TABLET BY MOUTH 3 TIMES A DAY AS NEEDED FOR NAUSEA 11/30/19  Yes Shadad, Mathis Dad, MD  polyethylene glycol (MIRALAX / GLYCOLAX) 17 g packet Take 17 g by mouth daily. Patient taking differently: Take 17 g by mouth daily as needed for moderate constipation.  08/27/19  Yes Sherwood Gambler, MD  prochlorperazine (COMPAZINE) 10 MG tablet Take 1 tablet (10 mg total) by mouth every 6 (six) hours as needed for nausea or vomiting. 09/20/19  Yes Shadad, Mathis Dad, MD  senna-docusate (SENOKOT-S) 8.6-50 MG tablet Take 1 tablet by mouth 2 (two) times daily. 11/29/19  Yes Wyatt Portela, MD  traMADol (ULTRAM) 50 MG tablet Take 1 tablet (50 mg total) by mouth every 6 (six) hours as needed. 11/29/19  Yes Wyatt Portela, MD  acetaminophen (TYLENOL) 500 MG tablet Take 500 mg by mouth every 6 (six) hours as needed for pain. Patient not taking: Reported on 12/19/2019    [provider]  lidocaine (XYLOCAINE) 2 % solution Use as directed 15 mLs in the mouth or throat as needed for mouth pain. Patient not taking: Reported on 12/19/2019 05/24/19   Wyatt Portela, MD  senna (SENOKOT) 8.6 MG TABS tablet Take 1 tablet (8.6 mg total) by mouth 2 (two) times daily. Patient not taking: Reported on 12/19/2019 08/28/19   Minette Brine, FNP  trifluridine-tipiracil (LONSURF) 20-8.19 MG tablet Take 3 tablets (60 mg of trifluridine total) by mouth 2 (two) times daily after a meal. 1 hr after AM & PM meals on days 1-5, 8-12. Repeat every 28day 11/16/19   Wyatt Portela, MD    Physical Exam: Constitutional: Moderately built and nourished. Vitals:   12/19/19 2245 12/20/19 0140 12/20/19 0142 12/20/19 0227  BP: (!) 133/91 128/89 127/88 124/82  Pulse: 92 87 85 84  Resp: 16  (!) 21 18  Temp:    98.6 F (37 C)  SpO2: 100% 100% 100% 100%  Weight:      Height:       Eyes: Anicteric no pallor. ENMT: No discharge from the ears eyes nose or mouth. Neck: No mass felt.  No neck rigidity. Respiratory: No rhonchi or crepitations. Cardiovascular: S1-S2 heard. Abdomen: Soft distended nontender bowel sounds not appreciated. Musculoskeletal: No edema. Skin: No rash. Neurologic: Alert awake oriented to time place and person.  Moves all extremities. Psychiatric: Appears normal.   Labs on Admission: I have personally reviewed following labs and imaging studies  CBC: Recent Labs  Lab 12/13/19 0855 12/19/19 2212  WBC 10.0 8.1  NEUTROABS 8.3*  --   HGB 11.0* 10.4*  HCT 33.8* 32.1*  MCV 100.6* 97.9  PLT 434* 825*   Basic Metabolic Panel: Recent Labs  Lab 12/13/19 0855 12/19/19 2212  NA 131* 127*  K 4.9 5.0  CL 97* 89*  CO2 25 25  GLUCOSE 109* 102*  BUN 13 16  CREATININE 0.83 0.75  CALCIUM 9.5 9.1   GFR: Estimated Creatinine Clearance: 54.9 mL/min (by C-G formula based on SCr of 0.75 mg/dL). Liver Function Tests: Recent Labs  Lab 12/13/19 0855 12/19/19 2212  AST 29 21  ALT 28 14  ALKPHOS 182* 115  BILITOT 0.5 0.6  PROT 7.9 7.8  ALBUMIN 2.8* 3.1*   Recent Labs  Lab 12/19/19 2212  LIPASE 23   No results for input(s): AMMONIA in the last 168  hours. Coagulation Profile: No results for input(s): INR, PROTIME in the last 168 hours. Cardiac Enzymes: No results for input(s): CKTOTAL, CKMB, CKMBINDEX, TROPONINI in the last 168 hours. BNP (last 3 results) No results for input(s): PROBNP in the last 8760 hours. HbA1C: No results for input(s): HGBA1C in the last 72 hours. CBG: No results for input(s): GLUCAP in the last 168 hours. Lipid Profile: No results for input(s): CHOL, HDL, LDLCALC, TRIG, CHOLHDL, LDLDIRECT in the last 72 hours. Thyroid Function Tests: No results for input(s): TSH, T4TOTAL, FREET4, T3FREE, THYROIDAB in the last 72 hours. Anemia Panel: No results for input(s): VITAMINB12, FOLATE, FERRITIN, TIBC, IRON, RETICCTPCT in the last 72 hours. Urine analysis:    Component Value Date/Time   COLORURINE YELLOW 08/27/2019 [place] 08/27/2019 0557   LABSPEC 1.019 08/27/2019 0557   PHURINE 5.0 08/27/2019 0557   GLUCOSEU NEGATIVE 08/27/2019 0557   HGBUR NEGATIVE 08/27/2019 0557   BILIRUBINUR NEGATIVE 08/27/2019 0557   BILIRUBINUR neg 09/30/2012 1628   KETONESUR 5 (A) 08/27/2019 0557   PROTEINUR NEGATIVE 10/04/2019 1113   UROBILINOGEN 0.2 11/30/2014 1807   NITRITE NEGATIVE 08/27/2019 0557   LEUKOCYTESUR TRACE (A) 08/27/2019 0557   Sepsis Labs: @LABRCNTIP (procalcitonin:4,lacticidven:4) )No results found for this or any previous visit (from the past 240 hour(s)).   Radiological Exams on Admission: DG Abdomen 1 View  Result Date: 12/20/2019 CLINICAL DATA:  Enteric catheter placement EXAM: ABDOMEN - 1 VIEW COMPARISON:  12/19/2019 FINDINGS: Supine frontal view of the abdomen and pelvis demonstrates enteric catheter tip and side port projecting over proximal duodenum. Gaseous distention of the small bowel consistent with obstruction, unchanged. Excreted contrast within the kidneys, ureters, and bladder. IMPRESSION: 1. Enteric catheter overlying proximal duodenum. 2. Continued small bowel obstruction.  Electronically Signed   By: Randa Ngo M.D.   On: 12/20/2019 02:19   CT ABDOMEN PELVIS W CONTRAST  Result Date: 12/19/2019 CLINICAL DATA:  Metastatic colon cancer, abdominal pain and distension EXAM: CT ABDOMEN AND PELVIS WITH CONTRAST TECHNIQUE: Multidetector CT imaging of the abdomen and pelvis was performed using the standard protocol following bolus administration of intravenous contrast. CONTRAST:  14mL OMNIPAQUE IOHEXOL 300 MG/ML  SOLN COMPARISON:  11/10/2019 FINDINGS: Lower chest: Minimal hypoventilatory changes at the lung bases. No acute  pleural or parenchymal lung disease. Hepatobiliary: Stable hypodensity posterior right lobe liver consistent with previous ablation. This measures approximately 4.7 x 2.1 cm on today's exam. There are new subcapsular hypodensities within the right lobe liver worrisome for disease progression. On image 18 there is a 1.5 cm hypodensity and image 37 there is a 2 cm hypodensity. The gallbladder is unremarkable. Pancreas: Unremarkable. No pancreatic ductal dilatation or surrounding inflammatory changes. Spleen: Normal in size without focal abnormality. Adrenals/Urinary Tract: Stable right renal cysts. No urinary tract calculi or obstruction. Bladder is unremarkable. The adrenals are normal. Stomach/Bowel: There is marked distension of the jejunum, measuring up to 3.9 cm in diameter. Transition within the lower pelvis to normal caliber distal jejunum and ileum. Gas and stool are seen within the colon. Vascular/Lymphatic: Aortic atherosclerosis. No pathologic adenopathy is identified. Reproductive: Status post hysterectomy. No adnexal masses. Other: Small volume ascites within the lower abdomen and pelvis, increased since prior study. No free intra-abdominal gas. Enlarging mesenteric metastatic disease is noted. Left upper quadrant omental mass measures approximately 11.1 x 2.2 cm. Soft tissue mass in the rectus sheath measures 4.1 x 2.3 cm. Peritoneal thickening in the  right lower pelvis is again noted, not significantly changed. Musculoskeletal: Postsurgical changes right hip. No acute or destructive bony lesions. Reconstructed images demonstrate no additional findings. IMPRESSION: 1. High-grade small bowel obstruction, with transition point in the lower pelvis. This could be due to adhesions or omental metastases. 2. Progression of metastatic disease, with multiple new liver lesions and enlarging omental and abdominal wall metastases. 3. Small volume ascites within the lower abdomen and pelvis, increased since prior study. 4. Aortic Atherosclerosis (ICD10-I70.0). Electronically Signed   By: Randa Ngo M.D.   On: 12/19/2019 23:49   CT L-SPINE NO CHARGE  Result Date: 12/19/2019 CLINICAL DATA:  Metastatic colon cancer, abdominal pain and distension EXAM: CT LUMBAR SPINE WITHOUT CONTRAST TECHNIQUE: Multidetector CT imaging of the lumbar spine was performed without intravenous contrast administration. Multiplanar CT image reconstructions were also generated. COMPARISON:  08/27/2019 FINDINGS: Segmentation: 5 lumbar type vertebrae. Alignment: Minimal grade 1 anterolisthesis of L4 on L5. No pars defects. Otherwise alignment is anatomic. Vertebrae: No acute or destructive bony lesions. Paraspinal and other soft tissues: Paraspinal soft tissues are unremarkable. Please refer to CT abdomen report describing high-grade small bowel obstruction and progressive metastatic colon cancer. Disc levels: Marked facet hypertrophy at L4-5 and L5-S1. Prominent spondylosis at L5-S1. IMPRESSION: 1. Lower lumbar spondylosis and facet hypertrophy. No acute or destructive bony lesion. Electronically Signed   By: Randa Ngo M.D.   On: 12/19/2019 23:51     Assessment/Plan Principal Problem:   SBO (small bowel obstruction) (HCC) Active Problems:   Hyponatremia   Anemia    1. Small bowel obstruction either due to adhesions of omental metastasis with history of colon cancer being followed  by Dr. Alen Blew oncologist -place patient on NG tube General surgery Dr. Ninfa Linden has been consulted n.p.o. IV fluids pain relief medications.  Further recommendations per general surgery.  KUB in the morning. 2. Hyponatremia could be from dehydration.  Since patient also has malignancy cannot rule out SIADH.  Patient received IV fluids in the ER.  Will recheck metabolic panel and closely follow sodium trends.  Check urine studies for sodium. 3. Anemia appears to be chronic likely related to malignancy.  Follow CBC. 4. Chronic pain on morphine presently on IV morphine.  Since patient has small bowel obstruction will need more than 2 midnight stay in inpatient status.  DVT prophylaxis: SCDs for now avoiding pharmacological DVT prophylaxis due to possible need for procedure. Code Status: Full code. Family Communication: Discussed with patient. Disposition Plan: Home. Consults called: General surgery. Admission status: Inpatient.   Rise Patience MD [facility] Pager [phone].  If 7PM-7AM, please contact night-coverage [email or web address] Password [email or web address]  12/20/2019, 2:52 AM

## 2019-12-20 NOTE — Consult Note (Signed)
[facility] Surgery Consult Note  Margaret Elliott 22-Dec-1950  [id number].    Requesting MD: Dr. Holli Humbles Chief Complaint/Reason for Consult: SBO HPI:  Patient is a 69 year old female who presented to [facility] with constipation and abdominal pain. Patient has a history of colon cancer with peritoneal mets, recently taken off chemotherapy and on supportive care only. She normally alternates between constipation and diarrhea. Last BM was on Sunday and was diarrhea. Since that time she has had worsening abdominal pain, mostly in the LLQ. She denies nausea and was not vomiting. She has also experienced decrease in urination. Denies fever, chills, chest pain, SOB. She recently saw oncology and changed from hydrocodone to morphine tablets for pain control. She takes senna and miralax daily. She has had several past abdominal surgeries. She has not had to be hospitalized for a bowel obstruction prior to this. NKDA. She has not seen palliative care or hospice yet. Her husband is at home this AM but she requested that I call and speak with him about my recommendations.   ROS: Review of Systems  Constitutional: Positive for malaise/fatigue. Negative for chills and fever.  Respiratory: Negative for shortness of breath and wheezing.   Cardiovascular: Negative for chest pain and palpitations.  Gastrointestinal: Positive for abdominal pain, constipation and diarrhea. Negative for nausea and vomiting.  Genitourinary:       Decreased urination   All other systems reviewed and are negative.   Family History  Problem Relation Age of Onset  . Heart disease Mother   . COPD Mother     Past Medical History:  Diagnosis Date  . Anemia   . Closed right hip fracture ([place]) 06/06/2019  . colon ca dx'd 11/2012  . History of chemotherapy     Past Surgical History:  Procedure Laterality Date  . ABDOMINAL HYSTERECTOMY    . ABDOMINAL HYSTERECTOMY    . ablation of liver      09/2014  . APPENDECTOMY N/A  12/09/2012   Procedure: APPENDECTOMY;  Surgeon: Ralene Ok, MD;  Location: [place];  Service: General;  Laterality: N/A;  . GANGLION CYST EXCISION    . INTRAMEDULLARY (IM) NAIL INTERTROCHANTERIC Right 06/07/2019   Procedure: INTRAMEDULLARY (IM) NAIL INTERTROCHANTRIC;  Surgeon: Rod Can, MD;  Location: [place];  Service: Orthopedics;  Laterality: Right;  . IR GENERIC HISTORICAL  08/09/2014   IR RADIOLOGIST EVAL & MGMT 08/09/2014 Sandi Mariscal, MD [facility] INTERV RAD  . LAPAROSCOPY N/A 12/09/2012   Procedure: LAPAROSCOPY DIAGNOSTIC;  Surgeon: Ralene Ok, MD;  Location: [place];  Service: General;  Laterality: N/A;  . PARTIAL COLECTOMY Right 12/09/2012   Procedure:  Right Hemi-colectomy, Resection of Distal Ileum;  Surgeon: Ralene Ok, MD;  Location: [place];  Service: General;  Laterality: Right;  . PORTACATH PLACEMENT Left 01/04/2013   Procedure: INSERTION PORT-A-CATH;  Surgeon: Ralene Ok, MD;  Location: [place];  Service: General;  Laterality: Left;  . TUBAL LIGATION      Social History:  reports that she quit smoking about 47 years ago. Her smoking use included cigarettes. She has a 1.00 pack-year smoking history. She has never used smokeless tobacco. She reports current alcohol use. She reports that she does not use drugs.  Allergies: No Known Allergies  Medications Prior to Admission  Medication Sig Dispense Refill  . calcium-vitamin D (OSCAL WITH D) 500-200 MG-UNIT tablet Take 1 tablet by mouth daily with breakfast. 30 tablet 0  . diphenoxylate-atropine (LOMOTIL) 2.5-0.025 MG tablet Take 1 tablet by mouth  4 (four) times daily as needed for diarrhea or loose stools. 30 tablet 0  . docusate sodium (COLACE) 100 MG capsule Take 1 capsule (100 mg total) by mouth every 12 (twelve) hours. (Patient taking differently: Take 100 mg by mouth daily as needed for mild constipation. ) 60 capsule 0  . HYDROcodone-acetaminophen (NORCO) 5-325 MG tablet Take 1 tablet by mouth every 6 (six) hours as  needed for moderate pain. (Patient taking differently: Take 1 tablet by mouth every 6 (six) hours as needed for moderate pain or severe pain. ) 30 tablet 0  . lidocaine-prilocaine (EMLA) cream Apply 1 application topically as needed. Apply approx 1/2 tsp to skin over port, prior to chemotherapy treatments (Patient taking differently: Apply 1 application topically as needed (access port). Apply approx 1/2 tsp to skin over port, prior to chemotherapy treatments) 1 each 3  . LORazepam (ATIVAN) 1 MG tablet Take 1 tablet (1 mg total) by mouth every 6 (six) hours as needed for sleep. 60 tablet 0  . megestrol (MEGACE) 400 MG/10ML suspension Take 10 mLs (400 mg total) by mouth daily. 240 mL 0  . morphine (MS CONTIN) 30 MG 12 hr tablet Take 1 tablet (30 mg total) by mouth every 12 (twelve) hours. 60 tablet 0  . Multiple Vitamin (MULTIVITAMIN ADULT PO) Take 1 tablet by mouth daily.    . ondansetron (ZOFRAN) 8 MG tablet TAKE 1 TABLET BY MOUTH 3 TIMES A DAY AS NEEDED FOR NAUSEA (Patient taking differently: Take 8 mg by mouth every 8 (eight) hours as needed for nausea or vomiting. TAKE 1 TABLET BY MOUTH 3 TIMES A DAY AS NEEDED FOR NAUSEA) 30 tablet 0  . polyethylene glycol (MIRALAX / GLYCOLAX) 17 g packet Take 17 g by mouth daily. (Patient taking differently: Take 17 g by mouth daily as needed for moderate constipation. ) 14 each 0  . prochlorperazine (COMPAZINE) 10 MG tablet Take 1 tablet (10 mg total) by mouth every 6 (six) hours as needed for nausea or vomiting. 30 tablet 2  . senna-docusate (SENOKOT-S) 8.6-50 MG tablet Take 1 tablet by mouth 2 (two) times daily. 60 tablet 1  . traMADol (ULTRAM) 50 MG tablet Take 1 tablet (50 mg total) by mouth every 6 (six) hours as needed. 90 tablet 1  . acetaminophen (TYLENOL) 500 MG tablet Take 500 mg by mouth every 6 (six) hours as needed for pain. (Patient not taking: Reported on 12/19/2019)    . lidocaine (XYLOCAINE) 2 % solution Use as directed 15 mLs in the mouth or throat  as needed for mouth pain. (Patient not taking: Reported on 12/19/2019) 100 mL 1  . senna (SENOKOT) 8.6 MG TABS tablet Take 1 tablet (8.6 mg total) by mouth 2 (two) times daily. (Patient not taking: Reported on 12/19/2019) 120 tablet 1  . trifluridine-tipiracil (LONSURF) 20-8.19 MG tablet Take 3 tablets (60 mg of trifluridine total) by mouth 2 (two) times daily after a meal. 1 hr after AM & PM meals on days 1-5, 8-12. Repeat every 28day 60 tablet 0    Blood pressure 121/82, pulse 83, temperature 97.9 F (36.6 C), resp. rate 17, height 5\' 4"  (1.626 m), weight 56.1 kg, SpO2 100 %. Physical Exam:  General: pleasant, WD, cachectic female who is laying in bed in NAD HEENT: Sclera are noninjected.  PERRL.  Ears and nose without any masses or lesions.  Mouth is pink and moist Heart: regular, rate, and rhythm.  Normal s1,s2. No obvious murmurs, gallops, or rubs noted.  Palpable radial and pedal pulses bilaterally Lungs: CTAB, no wheezes, rhonchi, or rales noted.  Respiratory effort nonlabored Abd: soft, mildly ttp in LLQ, ND, +BS, NGT present with bilious drainage MS: all 4 extremities are symmetrical with no cyanosis, clubbing, or edema. Skin: warm and dry with no masses, lesions, or rashes Neuro: Cranial nerves 2-12 grossly intact, sensation grossly intact throughout Psych: A&Ox3 with an appropriate affect.   Results for orders placed or performed during the hospital encounter of 12/19/19 (from the past 48 hour(s))  CBC     Status: Abnormal   Collection Time: 12/19/19 10:12 PM  Result Value Ref Range   WBC 8.1 4.0 - 10.5 K/uL   RBC 3.28 (L) 3.87 - 5.11 MIL/uL   Hemoglobin 10.4 (L) 12.0 - 15.0 g/dL   HCT 32.1 (L) 36 - 46 %   MCV 97.9 80.0 - 100.0 fL   MCH 31.7 26.0 - 34.0 pg   MCHC 32.4 30.0 - 36.0 g/dL   RDW 14.2 11.5 - 15.5 %   Platelets 415 (H) 150 - 400 K/uL   nRBC 0.0 0.0 - 0.2 %    Comment: Performed at [facility], [place] [address] [place], [place] [address]  Basic  metabolic panel     Status: Abnormal   Collection Time: 12/19/19 10:12 PM  Result Value Ref Range   Sodium 127 (L) 135 - 145 mmol/L   Potassium 5.0 3.5 - 5.1 mmol/L   Chloride 89 (L) 98 - 111 mmol/L   CO2 25 22 - 32 mmol/L   Glucose, Bld 102 (H) 70 - 99 mg/dL    Comment: Glucose reference range applies only to samples taken after fasting for at least 8 hours.   BUN 16 8 - 23 mg/dL   Creatinine, Ser 0.75 0.44 - 1.00 mg/dL   Calcium 9.1 8.9 - 10.3 mg/dL   GFR calc non Af Amer >60 >60 mL/min   GFR calc Af Amer >60 >60 mL/min   Anion gap 13 5 - 15    Comment: Performed at [facility], [place] [address] [place], [place] [address]  Hepatic function panel     Status: Abnormal   Collection Time: 12/19/19 10:12 PM  Result Value Ref Range   Total Protein 7.8 6.5 - 8.1 g/dL   Albumin 3.1 (L) 3.5 - 5.0 g/dL   AST 21 15 - 41 U/L   ALT 14 0 - 44 U/L   Alkaline Phosphatase 115 38 - 126 U/L   Total Bilirubin 0.6 0.3 - 1.2 mg/dL   Bilirubin, Direct 0.1 0.0 - 0.2 mg/dL   Indirect Bilirubin 0.5 0.3 - 0.9 mg/dL    Comment: Performed at [facility], [place] [address], [place], [place] [address]  Lipase, blood     Status: None   Collection Time: 12/19/19 10:12 PM  Result Value Ref Range   Lipase 23 11 - 51 U/L    Comment: Performed at [facility], [place] [address], [place], [place] [address]  SARS Coronavirus 2 by RT PCR (hospital order, performed in [facility] hospital lab) Nasopharyngeal Nasopharyngeal Swab     Status: None   Collection Time: 12/20/19  1:30 AM   Specimen: Nasopharyngeal Swab  Result Value Ref Range   SARS Coronavirus 2 NEGATIVE NEGATIVE    Comment: (NOTE) SARS-CoV-2 target nucleic acids are NOT DETECTED.  The SARS-CoV-2 RNA is generally detectable in upper and lower respiratory specimens during the acute phase of infection. The  lowest concentration of SARS-CoV-2 viral copies this assay can detect is 250 copies / mL. A  negative result does not preclude SARS-CoV-2 infection and should not be used as the sole basis for treatment or other patient management decisions.  A negative result may occur with improper specimen collection / handling, submission of specimen other than nasopharyngeal swab, presence of viral mutation(s) within the areas targeted by this assay, and inadequate number of viral copies (<250 copies / mL). A negative result must be combined with clinical observations, patient history, and epidemiological information.  Fact Sheet for Patients:   StrictlyIdeas.no  Fact Sheet for Healthcare Providers: BankingDealers.co.za  This test is not yet approved or  cleared by the [place] FDA and has been authorized for detection and/or diagnosis of SARS-CoV-2 by FDA under an Emergency Use Authorization (EUA).  This EUA will remain in effect (meaning this test can be used) for the duration of the COVID-19 declaration under Section 564(b)(1) of the Act, 21 U.S.C. section 360bbb-3(b)(1), unless the authorization is terminated or revoked sooner.  Performed at [facility], [place] [address] [place], [place] [address]   Urinalysis, Routine w reflex microscopic- may I&O cath if menses     Status: Abnormal   Collection Time: 12/20/19  2:30 AM  Result Value Ref Range   Color, Urine YELLOW YELLOW   APPearance CLEAR CLEAR   Specific Gravity, Urine 1.040 (H) 1.005 - 1.030   pH 5.0 5.0 - 8.0   Glucose, UA NEGATIVE NEGATIVE mg/dL   Hgb urine dipstick NEGATIVE NEGATIVE   Bilirubin Urine NEGATIVE NEGATIVE   Ketones, ur 5 (A) NEGATIVE mg/dL   Protein, ur NEGATIVE NEGATIVE mg/dL   Nitrite NEGATIVE NEGATIVE   Leukocytes,Ua NEGATIVE NEGATIVE    Comment: Performed at [place] [address], [place], [place] [address]  Sodium, urine, random     Status: None   Collection Time: 12/20/19  2:30 AM  Result Value Ref  Range   Sodium, Ur 17 mmol/L    Comment: Performed at [facility] [place] [address] [place], [place] [address]  Osmolality, urine     Status: None   Collection Time: 12/20/19  2:30 AM  Result Value Ref Range   Osmolality, Ur 337 300 - 900 mOsm/kg    Comment: PERFORMED AT [facility] Performed at [facility], [place] [address], [place], [place] [address]   Basic metabolic panel     Status: Abnormal   Collection Time: 12/20/19  4:20 AM  Result Value Ref Range   Sodium 128 (L) 135 - 145 mmol/L   Potassium 5.0 3.5 - 5.1 mmol/L   Chloride 93 (L) 98 - 111 mmol/L   CO2 23 22 - 32 mmol/L   Glucose, Bld 85 70 - 99 mg/dL    Comment: Glucose reference range applies only to samples taken after fasting for at least 8 hours.   BUN 14 8 - 23 mg/dL   Creatinine, Ser 0.69 0.44 - 1.00 mg/dL   Calcium 8.8 (L) 8.9 - 10.3 mg/dL   GFR calc non Af Amer >60 >60 mL/min   GFR calc Af Amer >60 >60 mL/min   Anion gap 12 5 - 15    Comment: Performed at [facility], [place] [address] [place], [place] [address]  CBC     Status: Abnormal   Collection Time: 12/20/19  4:20 AM  Result Value Ref Range   WBC 8.6 4.0 - 10.5 K/uL  RBC 3.05 (L) 3.87 - 5.11 MIL/uL   Hemoglobin 9.9 (L) 12.0 - 15.0 g/dL   HCT 30.2 (L) 36 - 46 %   MCV 99.0 80.0 - 100.0 fL   MCH 32.5 26.0 - 34.0 pg   MCHC 32.8 30.0 - 36.0 g/dL   RDW 14.3 11.5 - 15.5 %   Platelets 366 150 - 400 K/uL   nRBC 0.0 0.0 - 0.2 %    Comment: Performed at [facility], [place] [address], [place], [place] [address]  Hepatic function panel     Status: Abnormal   Collection Time: 12/20/19  4:20 AM  Result Value Ref Range   Total Protein 6.8 6.5 - 8.1 g/dL   Albumin 2.7 (L) 3.5 - 5.0 g/dL   AST 19 15 - 41 U/L   ALT 13 0 - 44 U/L   Alkaline Phosphatase 111 38 - 126 U/L   Total Bilirubin 0.8 0.3 - 1.2 mg/dL   Bilirubin, Direct 0.1 0.0 - 0.2 mg/dL   Indirect Bilirubin 0.7 0.3 - 0.9 mg/dL     Comment: Performed at [facility], [place] [address] [place], [place] [address]  Glucose, capillary     Status: None   Collection Time: 12/20/19  5:15 AM  Result Value Ref Range   Glucose-Capillary 81 70 - 99 mg/dL    Comment: Glucose reference range applies only to samples taken after fasting for at least 8 hours.  Glucose, capillary     Status: None   Collection Time: 12/20/19  7:40 AM  Result Value Ref Range   Glucose-Capillary 72 70 - 99 mg/dL    Comment: Glucose reference range applies only to samples taken after fasting for at least 8 hours.   DG Abdomen 1 View  Result Date: 12/20/2019 CLINICAL DATA:  Enteric catheter placement EXAM: ABDOMEN - 1 VIEW COMPARISON:  12/19/2019 FINDINGS: Supine frontal view of the abdomen and pelvis demonstrates enteric catheter tip and side port projecting over proximal duodenum. Gaseous distention of the small bowel consistent with obstruction, unchanged. Excreted contrast within the kidneys, ureters, and bladder. IMPRESSION: 1. Enteric catheter overlying proximal duodenum. 2. Continued small bowel obstruction. Electronically Signed   By: Randa Ngo M.D.   On: 12/20/2019 02:19   CT ABDOMEN PELVIS W CONTRAST  Result Date: 12/19/2019 CLINICAL DATA:  Metastatic colon cancer, abdominal pain and distension EXAM: CT ABDOMEN AND PELVIS WITH CONTRAST TECHNIQUE: Multidetector CT imaging of the abdomen and pelvis was performed using the standard protocol following bolus administration of intravenous contrast. CONTRAST:  17mL OMNIPAQUE IOHEXOL 300 MG/ML  SOLN COMPARISON:  11/10/2019 FINDINGS: Lower chest: Minimal hypoventilatory changes at the lung bases. No acute pleural or parenchymal lung disease. Hepatobiliary: Stable hypodensity posterior right lobe liver consistent with previous ablation. This measures approximately 4.7 x 2.1 cm on today's exam. There are new subcapsular hypodensities within the right lobe liver worrisome for disease  progression. On image 18 there is a 1.5 cm hypodensity and image 37 there is a 2 cm hypodensity. The gallbladder is unremarkable. Pancreas: Unremarkable. No pancreatic ductal dilatation or surrounding inflammatory changes. Spleen: Normal in size without focal abnormality. Adrenals/Urinary Tract: Stable right renal cysts. No urinary tract calculi or obstruction. Bladder is unremarkable. The adrenals are normal. Stomach/Bowel: There is marked distension of the jejunum, measuring up to 3.9 cm in diameter. Transition within the lower pelvis to normal caliber distal jejunum and ileum. Gas and stool are seen within the colon. Vascular/Lymphatic: Aortic atherosclerosis. No pathologic adenopathy  is identified. Reproductive: Status post hysterectomy. No adnexal masses. Other: Small volume ascites within the lower abdomen and pelvis, increased since prior study. No free intra-abdominal gas. Enlarging mesenteric metastatic disease is noted. Left upper quadrant omental mass measures approximately 11.1 x 2.2 cm. Soft tissue mass in the rectus sheath measures 4.1 x 2.3 cm. Peritoneal thickening in the right lower pelvis is again noted, not significantly changed. Musculoskeletal: Postsurgical changes right hip. No acute or destructive bony lesions. Reconstructed images demonstrate no additional findings. IMPRESSION: 1. High-grade small bowel obstruction, with transition point in the lower pelvis. This could be due to adhesions or omental metastases. 2. Progression of metastatic disease, with multiple new liver lesions and enlarging omental and abdominal wall metastases. 3. Small volume ascites within the lower abdomen and pelvis, increased since prior study. 4. Aortic Atherosclerosis (ICD10-I70.0). Electronically Signed   By: Randa Ngo M.D.   On: 12/19/2019 23:49   CT L-SPINE NO CHARGE  Result Date: 12/19/2019 CLINICAL DATA:  Metastatic colon cancer, abdominal pain and distension EXAM: CT LUMBAR SPINE WITHOUT CONTRAST  TECHNIQUE: Multidetector CT imaging of the lumbar spine was performed without intravenous contrast administration. Multiplanar CT image reconstructions were also generated. COMPARISON:  08/27/2019 FINDINGS: Segmentation: 5 lumbar type vertebrae. Alignment: Minimal grade 1 anterolisthesis of L4 on L5. No pars defects. Otherwise alignment is anatomic. Vertebrae: No acute or destructive bony lesions. Paraspinal and other soft tissues: Paraspinal soft tissues are unremarkable. Please refer to CT abdomen report describing high-grade small bowel obstruction and progressive metastatic colon cancer. Disc levels: Marked facet hypertrophy at L4-5 and L5-S1. Prominent spondylosis at L5-S1. IMPRESSION: 1. Lower lumbar spondylosis and facet hypertrophy. No acute or destructive bony lesion. Electronically Signed   By: Randa Ngo M.D.   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 Adhesive - NGT with bilious drainage, discussed repositioning of NGT with RN - will attempt small bowel protocol and see if patient opens up with conservative management - would really not recommend surgical intervention in this patient, I do not think that an operation will improve this patient's quality of life and it may honestly worsen it - if unable to improve with conservative management, would recommend IR vs GI placement of palliative gastrostomy for venting prn - recommend palliative consult to establish [place] - I discussed with patient as well as her husband   FEN: NPO, IVF, NGT to LIWS VTE: SCDs, ok to have chemical prophylaxis from a surgery standpoint ID: no abx indicated at this time   Norm Parcel, [facility] Surgery 12/20/2019, 10:07 AM Please see Amion for pager number during day hours 7:00am-4:30pm

## 2019-12-21 DIAGNOSIS — K56609 Unspecified intestinal obstruction, unspecified as to partial versus complete obstruction: Secondary | ICD-10-CM

## 2019-12-21 DIAGNOSIS — C189 Malignant neoplasm of colon, unspecified: Secondary | ICD-10-CM

## 2019-12-21 DIAGNOSIS — Z515 Encounter for palliative care: Secondary | ICD-10-CM

## 2019-12-21 DIAGNOSIS — G893 Neoplasm related pain (acute) (chronic): Secondary | ICD-10-CM

## 2019-12-21 DIAGNOSIS — Z7189 Other specified counseling: Secondary | ICD-10-CM

## 2019-12-21 LAB — GLUCOSE, CAPILLARY
Glucose-Capillary: 85 mg/dL (ref 70–99)
Glucose-Capillary: 85 mg/dL (ref 70–99)
Glucose-Capillary: 107 mg/dL — ABNORMAL HIGH (ref 70–99)

## 2019-12-21 LAB — BASIC METABOLIC PANEL
Anion gap: 8 (ref 5–15)
BUN: 10 mg/dL (ref 8–23)
CO2: 26 mmol/L (ref 22–32)
Calcium: 8.2 mg/dL — ABNORMAL LOW (ref 8.9–10.3)
Chloride: 99 mmol/L (ref 98–111)
Creatinine, Ser: 0.55 mg/dL (ref 0.44–1.00)
GFR calc Af Amer: >60 mL/min (ref >60)
GFR calc non Af Amer: >60 mL/min (ref >60)
Glucose, Bld: 83 mg/dL (ref 70–99)
Potassium: 3.9 mmol/L (ref 3.5–5.1)
Sodium: 133 mmol/L — ABNORMAL LOW (ref 135–145)

## 2019-12-21 LAB — CBC
HCT: 31.1 % — ABNORMAL LOW (ref 36.0–46.0)
Hemoglobin: 9.9 g/dL — ABNORMAL LOW (ref 12.0–15.0)
MCH: 31.9 pg (ref 26.0–34.0)
MCHC: 31.8 g/dL (ref 30.0–36.0)
MCV: 100.3 fL — ABNORMAL HIGH (ref 80.0–100.0)
Platelets: 377 10*3/uL (ref 150–400)
RBC: 3.1 MIL/uL — ABNORMAL LOW (ref 3.87–5.11)
RDW: 14.1 % (ref 11.5–15.5)
WBC: 7.9 10*3/uL (ref 4.0–10.5)
nRBC: 0 % (ref 0.0–0.2)

## 2019-12-21 MED ORDER — PRO-STAT SUGAR FREE PO LIQD
30.0000 mL | Freq: Two times a day (BID) | ORAL | Status: DC
  Administered 2019-12-21 – 2019-12-23 (×5): 30 mL via ORAL
  Filled 2019-12-21 (×5): qty 30

## 2019-12-21 MED ORDER — BOOST / RESOURCE BREEZE PO LIQD CUSTOM
1.0000 | Freq: Two times a day (BID) | ORAL | Status: DC
  Administered 2019-12-21 – 2019-12-23 (×3): 1 via ORAL

## 2019-12-21 MED ORDER — ADULT MULTIVITAMIN W/MINERALS CH
1.0000 | ORAL_TABLET | Freq: Every day | ORAL | Status: DC
  Administered 2019-12-22 – 2019-12-23 (×2): 1 via ORAL
  Filled 2019-12-21 (×2): qty 1

## 2019-12-21 NOTE — Progress Notes (Signed)
Initial Nutrition Assessment  DOCUMENTATION CODES:   Not applicable  INTERVENTION:  - will order Boost Breeze BID, each supplement provides 250 kcal and 9 grams of protein. - will order 30 mL Prostat BID, each supplement provides 100 kcal and 15 grams of protein. - will order 1 tablet multivitamin with minerals/day. - diet advancement as medically feasible.   NUTRITION DIAGNOSIS:   Increased nutrient needs related to chronic illness, cancer and cancer related treatments as evidenced by estimated needs.  GOAL:   Patient will meet greater than or equal to 90% of their needs  MONITOR:   PO intake, Supplement acceptance, Diet advancement, Labs, Weight trends  REASON FOR ASSESSMENT:   Malnutrition Screening Tool  ASSESSMENT:   69 y.o. female with history of colon cancer whose chemo was recently discontinued d/t progression of disease considering salvage therapy with previous history of R hemicolectomy and anastomosis. She presented to the ED due to worsening abdominal pain and distention, normal bowel movement x2 days, and vomiting starting the day of presentation.  Diet advanced from NPO to CLD today at 11 and no intakes documented since that time. Patient is currently out of the room to Diagnostic Radiology.   Per chart review, weight yesterday was 124 lb and weight on 5/26 was 128 lb. This indicates 4 lb weight loss (3% body weight) in the past 1 month; not significant for time frame.   Patient with NGT in place with plan for clamping trial today, per Surgery. If patient does not tolerate, plan is for NGT to return to LIS and to consult IR for PEG for venting. No plan for surgery.   Hx of stage 4 colon cancer with peritoneal carcinomatosis.    Labs reviewed; CBGs: 77 and 85 mg/dl, Na: 133 mmol/l, Ca: 8.2 mg/dl. Medications reviewed. IVF; D5-1/2 NS @ 75 ml/hr (306 kcal).    NUTRITION - FOCUSED PHYSICAL EXAM:  unable to complete at this time.   Diet Order:   Diet  Order            Diet clear liquid Room service appropriate? Yes; Fluid consistency: Thin  Diet effective now                 EDUCATION NEEDS:   Not appropriate for education at this time  Skin:  Skin Assessment: Reviewed RN Assessment  Last BM:  6/27  Height:   Ht Readings from Last 1 Encounters:  12/20/19 5\' 4"  (1.626 m)    Weight:   Wt Readings from Last 1 Encounters:  12/20/19 56.1 kg    Estimated Nutritional Needs:  Kcal:  1850-2075 kcal Protein:  95-110 grams Fluid:  >/= 2 L/day     Jarome Matin, MS, RD, LDN, CNSC Inpatient Clinical Dietitian RD pager # available in AMION  After hours/weekend pager # available in [facility]

## 2019-12-21 NOTE — Progress Notes (Signed)
PROGRESS NOTE    Margaret Elliott  JSH:[id number] DOB: July 28, 1950 DOA: 12/19/2019 PCP: Minette Brine, FNP   Brief Narrative:  Margaret Elliott is a 69 y.o. female with history of colon cancer being followed by Alen Blew recently discontinued chemotherapy due to progression of disease considering salvage therapy with previous history of right hemicolectomy and anastomosis presents to the EDR because of worsening abdominal pain distention with normal bowel movement for the last 2 days.  Started vomiting today.  Denies any fever chills chest pain or shortness of breath. In the ED patient CT abdomen pelvis is consistent with small bowel obstruction either due to ideations or omental metastasis.  In addition it also shows progression of the metastatic disease.  ER physician discussed with on-call general surgeon Dr. Ninfa Linden who advised NG tube suction and will be seeing patient in consult.  Labs are remarkable for sodium of 127.  Albumin 3.1 hemoglobin 10.4.  Covid test was negative.   Assessment & Plan:   Principal Problem:   SBO (small bowel obstruction) (HCC) Active Problems:   Hyponatremia   Anemia   Small bowel obstruction either due to adhesions vs omental metastasis  - History of colon cancer being followed by Dr. Alen Blew oncologist - NG tube clamped this morning, attempting clears at bedside - General surgery following, given findings likely nonsurgical patient would recommend palliative gastrostomy tube if indicated/no improvement in SBO -Small bowel follow-through series shows improving obstruction  Hypovolemic hyponatremia, resolving   - Improving with IV fluids, continue to advance diet as tolerated - Continue to follow morning labs  Anemia appears to be chronic likely related to malignancy.  -Stable, likely at baseline, follow repeat labs.  Chronic pain in the setting of above -Continue home medications, presently on IV morphine for breakthrough pain as well.   DVT  prophylaxis: SCDs perioperatively Code Status: Full code Family Communication: None present  Status is: Inpatient  Dispo: The patient is from: Home              Anticipated d/c is to: To be determined              Anticipated d/c date is: 24-48 hours              Patient currently not medically stable for discharge to need for ongoing symptom control, possible need for procedure or surgery pending clinical course  Consultants:   General surgery, oncology  Procedures:   None planned currently  Antimicrobials:  None indicated  Subjective: No acute issues or events overnight, abdominal pain nausea have drastically improved with current regimen.  Denies chest pain, shortness of breath, headache, fevers, chills.  Objective: Vitals:   12/20/19 0957 12/20/19 1422 12/20/19 2247 12/21/19 0433  BP: 121/82 126/85 134/77 140/86  Pulse: 83 85 88 86  Resp: 17 17 18 16   Temp: 97.9 F (36.6 C) 97.9 F (36.6 C) 98.2 F (36.8 C) 97.9 F (36.6 C)  TempSrc:   Oral Oral  SpO2: 100% 98% 100% 99%  Weight:      Height:        Intake/Output Summary (Last 24 hours) at 12/21/2019 0739 Last data filed at 12/21/2019 0600 Gross per 24 hour  Intake 149.97 ml  Output 1300 ml  Net -1150.03 ml   Filed Weights   12/19/19 1740 12/20/19 0227  Weight: 52.6 kg 56.1 kg    Examination:  General:  Pleasantly resting in bed, No acute distress. HEENT:  Normocephalic atraumatic.  Sclerae nonicteric, noninjected.  Extraocular movements intact bilaterally. Neck:  Without mass or deformity.  Trachea is midline. Lungs:  Clear to auscultate bilaterally without rhonchi, wheeze, or rales. Heart:  Regular rate and rhythm.  Without murmurs, rubs, or gallops. Abdomen:  Soft, minimally tender, nondistended Extremities: Without cyanosis, clubbing, edema, or obvious deformity. Vascular:  Dorsalis pedis and posterior tibial pulses palpable bilaterally. Skin:  Warm and dry, no erythema, no ulcerations.  Data  Reviewed: I have personally reviewed following labs and imaging studies  CBC: Recent Labs  Lab 12/19/19 2212 12/20/19 0420 12/21/19 0200  WBC 8.1 8.6 7.9  HGB 10.4* 9.9* 9.9*  HCT 32.1* 30.2* 31.1*  MCV 97.9 99.0 100.3*  PLT 415* 366 161   Basic Metabolic Panel: Recent Labs  Lab 12/19/19 2212 12/20/19 0420 12/21/19 0200  NA 127* 128* 133*  K 5.0 5.0 3.9  CL 89* 93* 99  CO2 25 23 26   GLUCOSE 102* 85 83  BUN 16 14 10   CREATININE 0.75 0.69 0.55  CALCIUM 9.1 8.8* 8.2*   GFR: Estimated Creatinine Clearance: 57.3 mL/min (by C-G formula based on SCr of 0.55 mg/dL). Liver Function Tests: Recent Labs  Lab 12/19/19 2212 12/20/19 0420  AST 21 19  ALT 14 13  ALKPHOS 115 111  BILITOT 0.6 0.8  PROT 7.8 6.8  ALBUMIN 3.1* 2.7*   Recent Labs  Lab 12/19/19 2212  LIPASE 23   No results for input(s): AMMONIA in the last 168 hours. Coagulation Profile: No results for input(s): INR, PROTIME in the last 168 hours. Cardiac Enzymes: No results for input(s): CKTOTAL, CKMB, CKMBINDEX, TROPONINI in the last 168 hours. BNP (last 3 results) No results for input(s): PROBNP in the last 8760 hours. HbA1C: No results for input(s): HGBA1C in the last 72 hours. CBG: Recent Labs  Lab 12/20/19 0515 12/20/19 0740 12/20/19 1602 12/20/19 1802 12/21/19 0139  GLUCAP 81 72 53* 77 85   Lipid Profile: No results for input(s): CHOL, HDL, LDLCALC, TRIG, CHOLHDL, LDLDIRECT in the last 72 hours. Thyroid Function Tests: No results for input(s): TSH, T4TOTAL, FREET4, T3FREE, THYROIDAB in the last 72 hours. Anemia Panel: No results for input(s): VITAMINB12, FOLATE, FERRITIN, TIBC, IRON, RETICCTPCT in the last 72 hours. Sepsis Labs: No results for input(s): PROCALCITON, LATICACIDVEN in the last 168 hours.  Recent Results (from the past 240 hour(s))  SARS Coronavirus 2 by RT PCR (hospital order, performed in [facility] hospital lab) Nasopharyngeal Nasopharyngeal Swab     Status: None    Collection Time: 12/20/19  1:30 AM   Specimen: Nasopharyngeal Swab  Result Value Ref Range Status   SARS Coronavirus 2 NEGATIVE NEGATIVE Final    Comment: (NOTE) SARS-CoV-2 target nucleic acids are NOT DETECTED.  The SARS-CoV-2 RNA is generally detectable in upper and lower respiratory specimens during the acute phase of infection. The lowest concentration of SARS-CoV-2 viral copies this assay can detect is 250 copies / mL. A negative result does not preclude SARS-CoV-2 infection and should not be used as the sole basis for treatment or other patient management decisions.  A negative result may occur with improper specimen collection / handling, submission of specimen other than nasopharyngeal swab, presence of viral mutation(s) within the areas targeted by this assay, and inadequate number of viral copies (<250 copies / mL). A negative result must be combined with clinical observations, patient history, and epidemiological information.  Fact Sheet for Patients:   StrictlyIdeas.no  Fact Sheet for Healthcare Providers: BankingDealers.co.za  This test is not yet approved or  cleared by the [place] and has been authorized for detection and/or diagnosis of SARS-CoV-2 by FDA under an Emergency Use Authorization (EUA).  This EUA will remain in effect (meaning this test can be used) for the duration of the COVID-19 declaration under Section 564(b)(1) of the Act, 21 U.S.C. section 360bbb-3(b)(1), unless the authorization is terminated or revoked sooner.  Performed at [facility], [place] [address] [place], [place] [address]     Radiology Studies: DG Abdomen 1 View  Result Date: 12/20/2019 CLINICAL DATA:  Enteric catheter placement EXAM: ABDOMEN - 1 VIEW COMPARISON:  12/19/2019 FINDINGS: Supine frontal view of the abdomen and pelvis demonstrates enteric catheter tip and side port projecting over proximal  duodenum. Gaseous distention of the small bowel consistent with obstruction, unchanged. Excreted contrast within the kidneys, ureters, and bladder. IMPRESSION: 1. Enteric catheter overlying proximal duodenum. 2. Continued small bowel obstruction. Electronically Signed   By: Randa Ngo M.D.   On: 12/20/2019 02:19   CT ABDOMEN PELVIS W CONTRAST  Result Date: 12/19/2019 CLINICAL DATA:  Metastatic colon cancer, abdominal pain and distension EXAM: CT ABDOMEN AND PELVIS WITH CONTRAST TECHNIQUE: Multidetector CT imaging of the abdomen and pelvis was performed using the standard protocol following bolus administration of intravenous contrast. CONTRAST:  164mL OMNIPAQUE IOHEXOL 300 MG/ML  SOLN COMPARISON:  11/10/2019 FINDINGS: Lower chest: Minimal hypoventilatory changes at the lung bases. No acute pleural or parenchymal lung disease. Hepatobiliary: Stable hypodensity posterior right lobe liver consistent with previous ablation. This measures approximately 4.7 x 2.1 cm on today's exam. There are new subcapsular hypodensities within the right lobe liver worrisome for disease progression. On image 18 there is a 1.5 cm hypodensity and image 37 there is a 2 cm hypodensity. The gallbladder is unremarkable. Pancreas: Unremarkable. No pancreatic ductal dilatation or surrounding inflammatory changes. Spleen: Normal in size without focal abnormality. Adrenals/Urinary Tract: Stable right renal cysts. No urinary tract calculi or obstruction. Bladder is unremarkable. The adrenals are normal. Stomach/Bowel: There is marked distension of the jejunum, measuring up to 3.9 cm in diameter. Transition within the lower pelvis to normal caliber distal jejunum and ileum. Gas and stool are seen within the colon. Vascular/Lymphatic: Aortic atherosclerosis. No pathologic adenopathy is identified. Reproductive: Status post hysterectomy. No adnexal masses. Other: Small volume ascites within the lower abdomen and pelvis, increased since prior  study. No free intra-abdominal gas. Enlarging mesenteric metastatic disease is noted. Left upper quadrant omental mass measures approximately 11.1 x 2.2 cm. Soft tissue mass in the rectus sheath measures 4.1 x 2.3 cm. Peritoneal thickening in the right lower pelvis is again noted, not significantly changed. Musculoskeletal: Postsurgical changes right hip. No acute or destructive bony lesions. Reconstructed images demonstrate no additional findings. IMPRESSION: 1. High-grade small bowel obstruction, with transition point in the lower pelvis. This could be due to adhesions or omental metastases. 2. Progression of metastatic disease, with multiple new liver lesions and enlarging omental and abdominal wall metastases. 3. Small volume ascites within the lower abdomen and pelvis, increased since prior study. 4. Aortic Atherosclerosis (ICD10-I70.0). Electronically Signed   By: Randa Ngo M.D.   On: 12/19/2019 23:49   CT L-SPINE NO CHARGE  Result Date: 12/19/2019 CLINICAL DATA:  Metastatic colon cancer, abdominal pain and distension EXAM: CT LUMBAR SPINE WITHOUT CONTRAST TECHNIQUE: Multidetector CT imaging of the lumbar spine was performed without intravenous contrast administration. Multiplanar CT image reconstructions were also generated. COMPARISON:  08/27/2019 FINDINGS: Segmentation: 5 lumbar type vertebrae. Alignment: Minimal grade 1 anterolisthesis  of L4 on L5. No pars defects. Otherwise alignment is anatomic. Vertebrae: No acute or destructive bony lesions. Paraspinal and other soft tissues: Paraspinal soft tissues are unremarkable. Please refer to CT abdomen report describing high-grade small bowel obstruction and progressive metastatic colon cancer. Disc levels: Marked facet hypertrophy at L4-5 and L5-S1. Prominent spondylosis at L5-S1. IMPRESSION: 1. Lower lumbar spondylosis and facet hypertrophy. No acute or destructive bony lesion. Electronically Signed   By: Randa Ngo M.D.   On: 12/19/2019 23:51    DG Abd Portable 1V-Small Bowel Obstruction Protocol-initial, 8 hr delay  Result Date: 12/20/2019 CLINICAL DATA:  Small bowel obstruction EXAM: PORTABLE ABDOMEN - 1 VIEW COMPARISON:  12/20/2019 FINDINGS: NG tube is transpyloric with the tip in the descending duodenum. Oral contrast material is seen within the colon. Several mildly prominent small bowel loops are noted centrally. No free air organomegaly. IMPRESSION: Partial small bowel obstruction pattern improved since prior study. NG tube tip is in the descending duodenum. Electronically Signed   By: Rolm Baptise M.D.   On: 12/20/2019 20:53    Scheduled Meds: . Chlorhexidine Gluconate Cloth  6 each Topical Daily  . sodium chloride flush  10-40 mL Intracatheter Q12H   Continuous Infusions: . dextrose 5 % and 0.45% NaCl 75 mL/hr at 12/20/19 1826     LOS: 1 day   Time spent: 32min  Synai Prettyman C Dina Mobley, DO [facility]  If 7PM-7AM, please contact night-coverage [email or web address]  12/21/2019, 7:39 AM

## 2019-12-21 NOTE — Progress Notes (Signed)
Patient NGT clamped and educated patient about clear liquid diet. mouthcare provided

## 2019-12-21 NOTE — Progress Notes (Signed)
NG tube in place. 300 mL out this shift.

## 2019-12-21 NOTE — Plan of Care (Signed)
  Problem: Education: Goal: Knowledge of General Education information will improve Description: Including pain rating scale, medication(s)/side effects and non-pharmacologic comfort measures Outcome: Progressing   Problem: Health Behavior/Discharge Planning: Goal: Ability to manage health-related needs will improve Outcome: Progressing   Problem: Clinical Measurements: Goal: Ability to maintain clinical measurements within normal limits will improve Outcome: Progressing Goal: Will remain free from infection Outcome: Progressing Goal: Diagnostic test results will improve Outcome: Progressing Goal: Cardiovascular complication will be avoided Outcome: Progressing   Problem: Activity: Goal: Risk for activity intolerance will decrease Outcome: Progressing   Problem: Elimination: Goal: Will not experience complications related to bowel motility Outcome: Progressing Goal: Will not experience complications related to urinary retention Outcome: Progressing   Problem: Nutrition: Goal: Adequate nutrition will be maintained Outcome: Progressing   Problem: Pain Managment: Goal: General experience of comfort will improve Outcome: Progressing   Problem: Safety: Goal: Ability to remain free from injury will improve Outcome: Progressing   Problem: Skin Integrity: Goal: Risk for impaired skin integrity will decrease Outcome: Progressing
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
## 2019-12-21 NOTE — Progress Notes (Signed)
IP PROGRESS NOTE  Subjective:   Ms. Cayson reports no major changes overnight.  She reports passing gas however.  She still has some abdominal fullness and pain as well as a fair amount of accumulation of her NG suction.  She denied any bowel movements at this time.  Pain is manageable with medication.  Objective:  Vital signs in last 24 hours: Temp:  [97.9 F (36.6 C)-98.2 F (36.8 C)] 97.9 F (36.6 C) (07/01 0433) Pulse Rate:  [83-88] 86 (07/01 0433) Resp:  [16-18] 16 (07/01 0433) BP: (121-140)/(77-86) 140/86 (07/01 0433) SpO2:  [98 %-100 %] 99 % (07/01 0433) Weight change:  Last BM Date: 12/17/19  Intake/Output from previous day: 06/30 0701 - 07/01 0700 In: 150 [I.V.:150] Out: 1300 [Urine:500; Emesis/NG output:800] General: Chronically ill appearing without distress. Head: Normocephalic atraumatic. Mouth: mucous membranes moist, pharynx normal without lesions Eyes: No scleral icterus.  Pupils are equal and round reactive to light. Resp: clear to auscultation bilaterally without rhonchi or wheezes or dullness to percussion. Cardio: regular rate and rhythm, S1, S2 normal, no murmur, click, rub or gallop GI: Distended with bowel sounds.  No rebound or guarding.  Tender to touch. Musculoskeletal: No joint deformity or effusion. Neurological: No motor, sensory deficits.  Intact deep tendon reflexes. Skin: No rashes or lesions.  Portacath without erythema  Lab Results: Recent Labs    12/20/19 0420 12/21/19 0200  WBC 8.6 7.9  HGB 9.9* 9.9*  HCT 30.2* 31.1*  PLT 366 377    BMET Recent Labs    12/20/19 0420 12/21/19 0200  NA 128* 133*  K 5.0 3.9  CL 93* 99  CO2 23 26  GLUCOSE 85 83  BUN 14 10  CREATININE 0.69 0.55  CALCIUM 8.8* 8.2*    Studies/Results: DG Abdomen 1 View  Result Date: 12/20/2019 CLINICAL DATA:  Enteric catheter placement EXAM: ABDOMEN - 1 VIEW COMPARISON:  12/19/2019 FINDINGS: Supine frontal view of the abdomen and pelvis demonstrates  enteric catheter tip and side port projecting over proximal duodenum. Gaseous distention of the small bowel consistent with obstruction, unchanged. Excreted contrast within the kidneys, ureters, and bladder. IMPRESSION: 1. Enteric catheter overlying proximal duodenum. 2. Continued small bowel obstruction. Electronically Signed   By: Randa Ngo M.D.   On: 12/20/2019 02:19   CT ABDOMEN PELVIS W CONTRAST  Result Date: 12/19/2019 CLINICAL DATA:  Metastatic colon cancer, abdominal pain and distension EXAM: CT ABDOMEN AND PELVIS WITH CONTRAST TECHNIQUE: Multidetector CT imaging of the abdomen and pelvis was performed using the standard protocol following bolus administration of intravenous contrast. CONTRAST:  129mL OMNIPAQUE IOHEXOL 300 MG/ML  SOLN COMPARISON:  11/10/2019 FINDINGS: Lower chest: Minimal hypoventilatory changes at the lung bases. No acute pleural or parenchymal lung disease. Hepatobiliary: Stable hypodensity posterior right lobe liver consistent with previous ablation. This measures approximately 4.7 x 2.1 cm on today's exam. There are new subcapsular hypodensities within the right lobe liver worrisome for disease progression. On image 18 there is a 1.5 cm hypodensity and image 37 there is a 2 cm hypodensity. The gallbladder is unremarkable. Pancreas: Unremarkable. No pancreatic ductal dilatation or surrounding inflammatory changes. Spleen: Normal in size without focal abnormality. Adrenals/Urinary Tract: Stable right renal cysts. No urinary tract calculi or obstruction. Bladder is unremarkable. The adrenals are normal. Stomach/Bowel: There is marked distension of the jejunum, measuring up to 3.9 cm in diameter. Transition within the lower pelvis to normal caliber distal jejunum and ileum. Gas and stool are seen within the colon. Vascular/Lymphatic: Aortic  atherosclerosis. No pathologic adenopathy is identified. Reproductive: Status post hysterectomy. No adnexal masses. Other: Small volume ascites  within the lower abdomen and pelvis, increased since prior study. No free intra-abdominal gas. Enlarging mesenteric metastatic disease is noted. Left upper quadrant omental mass measures approximately 11.1 x 2.2 cm. Soft tissue mass in the rectus sheath measures 4.1 x 2.3 cm. Peritoneal thickening in the right lower pelvis is again noted, not significantly changed. Musculoskeletal: Postsurgical changes right hip. No acute or destructive bony lesions. Reconstructed images demonstrate no additional findings. IMPRESSION: 1. High-grade small bowel obstruction, with transition point in the lower pelvis. This could be due to adhesions or omental metastases. 2. Progression of metastatic disease, with multiple new liver lesions and enlarging omental and abdominal wall metastases. 3. Small volume ascites within the lower abdomen and pelvis, increased since prior study. 4. Aortic Atherosclerosis (ICD10-I70.0). Electronically Signed   By: Randa Ngo M.D.   On: 12/19/2019 23:49   CT L-SPINE NO CHARGE  Result Date: 12/19/2019 CLINICAL DATA:  Metastatic colon cancer, abdominal pain and distension EXAM: CT LUMBAR SPINE WITHOUT CONTRAST TECHNIQUE: Multidetector CT imaging of the lumbar spine was performed without intravenous contrast administration. Multiplanar CT image reconstructions were also generated. COMPARISON:  08/27/2019 FINDINGS: Segmentation: 5 lumbar type vertebrae. Alignment: Minimal grade 1 anterolisthesis of L4 on L5. No pars defects. Otherwise alignment is anatomic. Vertebrae: No acute or destructive bony lesions. Paraspinal and other soft tissues: Paraspinal soft tissues are unremarkable. Please refer to CT abdomen report describing high-grade small bowel obstruction and progressive metastatic colon cancer. Disc levels: Marked facet hypertrophy at L4-5 and L5-S1. Prominent spondylosis at L5-S1. IMPRESSION: 1. Lower lumbar spondylosis and facet hypertrophy. No acute or destructive bony lesion. Electronically  Signed   By: Randa Ngo M.D.   On: 12/19/2019 23:51   DG Abd Portable 1V-Small Bowel Obstruction Protocol-initial, 8 hr delay  Result Date: 12/20/2019 CLINICAL DATA:  Small bowel obstruction EXAM: PORTABLE ABDOMEN - 1 VIEW COMPARISON:  12/20/2019 FINDINGS: NG tube is transpyloric with the tip in the descending duodenum. Oral contrast material is seen within the colon. Several mildly prominent small bowel loops are noted centrally. No free air organomegaly. IMPRESSION: Partial small bowel obstruction pattern improved since prior study. NG tube tip is in the descending duodenum. Electronically Signed   By: Rolm Baptise M.D.   On: 12/20/2019 20:53    Medications: I have reviewed the patient's current medications.  Assessment/Plan:  69 year old woman with:  1.  Small bowel obstruction likely related to malignancy.  Follow-up abdominal imaging on 12/20/2019 showed potential pattern of improvement.  Clinically she does report passing gas which might indicate possible spontaneous resolution.  She would not be a good surgical candidate at this point to relieve that obstruction.   2.  Stage IV colorectal cancer: She has progression of disease after multiple treatments.  She is a marginal candidate for any additional treatment at this point.  She is on supportive management only.  3.  Prognosis: Overall poor with limited life expectancy.  I would favor no escalation of care if her condition started to deteriorate.  4.  Disposition: We will arrange follow-up upon discharge if she continues to improve.   25  minutes were dedicated to this visit.  50% of the time was face-to-face and the time was spent on reviewing laboratory data, imaging studies, discussing treatment options, and answering questions regarding future plan.    LOS: 1 day   Zola Button 12/21/2019, 7:12 AM

## 2019-12-21 NOTE — Progress Notes (Signed)
[facility] Progress Note     Subjective: Patient reports some left sided abdominal pain still. +flatus, no BM.   Objective: Vital signs in last 24 hours: Temp:  [97.9 F (36.6 C)-98.2 F (36.8 C)] 97.9 F (36.6 C) (07/01 0433) Pulse Rate:  [83-88] 86 (07/01 0433) Resp:  [16-18] 16 (07/01 0433) BP: (121-140)/(77-86) 140/86 (07/01 0433) SpO2:  [98 %-100 %] 99 % (07/01 0433) Last BM Date: 12/17/19  Intake/Output from previous day: 06/30 0701 - 07/01 0700 In: 150 [I.V.:150] Out: 1300 [Urine:500; Emesis/NG output:800] Intake/Output this shift: No intake/output data recorded.  PE: General: pleasant, WD, cachectic female who is laying in bed in NAD HEENT: Sclera are noninjected.  PERRL.  Ears and nose without any masses or lesions.  Mouth is pink and moist Heart: regular, rate, and rhythm.  Normal s1,s2. No obvious murmurs, gallops, or rubs noted.  Palpable radial and pedal pulses bilaterally Lungs: CTAB, no wheezes, rhonchi, or rales noted.  Respiratory effort nonlabored Abd: soft, mildly ttp in LLQ, mildly distended, +BS, NGT present  MS: all 4 extremities are symmetrical with no cyanosis, clubbing, or edema. Skin: warm and dry with no masses, lesions, or rashes Neuro: Cranial nerves 2-12 grossly intact, sensation grossly intact throughout Psych: A&Ox3 with an appropriate affect.    Lab Results:  Recent Labs    12/20/19 0420 12/21/19 0200  WBC 8.6 7.9  HGB 9.9* 9.9*  HCT 30.2* 31.1*  PLT 366 377   BMET Recent Labs    12/20/19 0420 12/21/19 0200  NA 128* 133*  K 5.0 3.9  CL 93* 99  CO2 23 26  GLUCOSE 85 83  BUN 14 10  CREATININE 0.69 0.55  CALCIUM 8.8* 8.2*   PT/INR No results for input(s): LABPROT, INR in the last 72 hours. CMP     Component Value Date/Time   NA 133 (L) 12/21/2019 0200   NA 141 05/26/2017 0809   K 3.9 12/21/2019 0200   K 4.0 05/26/2017 0809   CL 99 12/21/2019 0200   CO2 26 12/21/2019 0200   CO2 23 05/26/2017 0809    GLUCOSE 83 12/21/2019 0200   GLUCOSE 95 05/26/2017 0809   BUN 10 12/21/2019 0200   BUN 14.5 05/26/2017 0809   CREATININE 0.55 12/21/2019 0200   CREATININE 0.83 12/13/2019 0855   CREATININE 1.0 05/26/2017 0809   CALCIUM 8.2 (L) 12/21/2019 0200   CALCIUM 9.4 05/26/2017 0809   PROT 6.8 12/20/2019 0420   PROT 7.2 05/26/2017 0809   ALBUMIN 2.7 (L) 12/20/2019 0420   ALBUMIN 3.5 05/26/2017 0809   AST 19 12/20/2019 0420   AST 29 12/13/2019 0855   AST 18 05/26/2017 0809   ALT 13 12/20/2019 0420   ALT 28 12/13/2019 0855   ALT 11 05/26/2017 0809   ALKPHOS 111 12/20/2019 0420   ALKPHOS 59 05/26/2017 0809   BILITOT 0.8 12/20/2019 0420   BILITOT 0.5 12/13/2019 0855   BILITOT 1.11 05/26/2017 0809   GFRNONAA >60 12/21/2019 0200   GFRNONAA >60 12/13/2019 0855   GFRNONAA 57 (L) 02/12/2015 0706   GFRAA >60 12/21/2019 0200   GFRAA >60 12/13/2019 0855   GFRAA 65 02/12/2015 0706   Lipase     Component Value Date/Time   LIPASE 23 12/19/2019 2212       Studies/Results: DG Abdomen 1 View  Result Date: 12/20/2019 CLINICAL DATA:  Enteric catheter placement EXAM: ABDOMEN - 1 VIEW COMPARISON:  12/19/2019 FINDINGS: Supine frontal view of the abdomen and pelvis demonstrates enteric  catheter tip and side port projecting over proximal duodenum. Gaseous distention of the small bowel consistent with obstruction, unchanged. Excreted contrast within the kidneys, ureters, and bladder. IMPRESSION: 1. Enteric catheter overlying proximal duodenum. 2. Continued small bowel obstruction. Electronically Signed   By: Randa Ngo M.D.   On: 12/20/2019 02:19   CT ABDOMEN PELVIS W CONTRAST  Result Date: 12/19/2019 CLINICAL DATA:  Metastatic colon cancer, abdominal pain and distension EXAM: CT ABDOMEN AND PELVIS WITH CONTRAST TECHNIQUE: Multidetector CT imaging of the abdomen and pelvis was performed using the standard protocol following bolus administration of intravenous contrast. CONTRAST:  160mL OMNIPAQUE  IOHEXOL 300 MG/ML  SOLN COMPARISON:  11/10/2019 FINDINGS: Lower chest: Minimal hypoventilatory changes at the lung bases. No acute pleural or parenchymal lung disease. Hepatobiliary: Stable hypodensity posterior right lobe liver consistent with previous ablation. This measures approximately 4.7 x 2.1 cm on today's exam. There are new subcapsular hypodensities within the right lobe liver worrisome for disease progression. On image 18 there is a 1.5 cm hypodensity and image 37 there is a 2 cm hypodensity. The gallbladder is unremarkable. Pancreas: Unremarkable. No pancreatic ductal dilatation or surrounding inflammatory changes. Spleen: Normal in size without focal abnormality. Adrenals/Urinary Tract: Stable right renal cysts. No urinary tract calculi or obstruction. Bladder is unremarkable. The adrenals are normal. Stomach/Bowel: There is marked distension of the jejunum, measuring up to 3.9 cm in diameter. Transition within the lower pelvis to normal caliber distal jejunum and ileum. Gas and stool are seen within the colon. Vascular/Lymphatic: Aortic atherosclerosis. No pathologic adenopathy is identified. Reproductive: Status post hysterectomy. No adnexal masses. Other: Small volume ascites within the lower abdomen and pelvis, increased since prior study. No free intra-abdominal gas. Enlarging mesenteric metastatic disease is noted. Left upper quadrant omental mass measures approximately 11.1 x 2.2 cm. Soft tissue mass in the rectus sheath measures 4.1 x 2.3 cm. Peritoneal thickening in the right lower pelvis is again noted, not significantly changed. Musculoskeletal: Postsurgical changes right hip. No acute or destructive bony lesions. Reconstructed images demonstrate no additional findings. IMPRESSION: 1. High-grade small bowel obstruction, with transition point in the lower pelvis. This could be due to adhesions or omental metastases. 2. Progression of metastatic disease, with multiple new liver lesions and  enlarging omental and abdominal wall metastases. 3. Small volume ascites within the lower abdomen and pelvis, increased since prior study. 4. Aortic Atherosclerosis (ICD10-I70.0). Electronically Signed   By: Randa Ngo M.D.   On: 12/19/2019 23:49   CT L-SPINE NO CHARGE  Result Date: 12/19/2019 CLINICAL DATA:  Metastatic colon cancer, abdominal pain and distension EXAM: CT LUMBAR SPINE WITHOUT CONTRAST TECHNIQUE: Multidetector CT imaging of the lumbar spine was performed without intravenous contrast administration. Multiplanar CT image reconstructions were also generated. COMPARISON:  08/27/2019 FINDINGS: Segmentation: 5 lumbar type vertebrae. Alignment: Minimal grade 1 anterolisthesis of L4 on L5. No pars defects. Otherwise alignment is anatomic. Vertebrae: No acute or destructive bony lesions. Paraspinal and other soft tissues: Paraspinal soft tissues are unremarkable. Please refer to CT abdomen report describing high-grade small bowel obstruction and progressive metastatic colon cancer. Disc levels: Marked facet hypertrophy at L4-5 and L5-S1. Prominent spondylosis at L5-S1. IMPRESSION: 1. Lower lumbar spondylosis and facet hypertrophy. No acute or destructive bony lesion. Electronically Signed   By: Randa Ngo M.D.   On: 12/19/2019 23:51   DG Abd Portable 1V-Small Bowel Obstruction Protocol-initial, 8 hr delay  Result Date: 12/20/2019 CLINICAL DATA:  Small bowel obstruction EXAM: PORTABLE ABDOMEN - 1 VIEW COMPARISON:  12/20/2019 FINDINGS: NG tube is transpyloric with the tip in the descending duodenum. Oral contrast material is seen within the colon. Several mildly prominent small bowel loops are noted centrally. No free air organomegaly. IMPRESSION: Partial small bowel obstruction pattern improved since prior study. NG tube tip is in the descending duodenum. Electronically Signed   By: Rolm Baptise M.D.   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 Adhesive - film looks improved this AM and patient passing flatus - try clamping NGT and allowing CLD - if patient tolerates clamping and clears, can remove NGT this afternoon - if she does not tolerate this reconnect NGT to LIWS and consult IR for PEG - would really not recommend surgical intervention in this patient, I do not think that an operation will improve this patient's quality of life and it may honestly worsen it - if unable to improve with conservative management, would recommend IR vs GI placement of palliative gastrostomy for venting prn - recommend palliative consult to establish GOC  FEN: clamp NGT and trial CLD, IVF VTE: SCDs, ok to have chemical prophylaxis from a surgery standpoint ID: no abx indicated at this time  LOS: 1 day    Norm Parcel , [facility] 12/21/2019, 7:44 AM Please see Amion for pager number during day hours 7:00am-4:30pm

## 2019-12-21 NOTE — Progress Notes (Signed)
[facility]) Hospital Liaison: RN note    Notified by Transition of Care Manger of patient/family request for [facility] services at home after discharge. Chart and patient information under review by [facility] physician. Hospice eligibility pending currently.    Spoke with  Anwita to initiate education related to hospice philosophy, services and team approach to care.  Sierrah  verbalized understanding of information given. Unsure of discharge date at this time.   Please send signed and completed DNR form home with patient/family. Patient will need prescriptions for discharge comfort medications.     DME needs have been discussed, patient currently has the following equipment in the home: walker.  Patient/family requests the following DME for delivery to the home: None at this time. The patient would like to get home and decide if she needs a bed before ordering. Home address has been verified and is correct in the chart. Krystianna is contact to arrange time of delivery.     [facility] aware of the above.  Please notify [facility] when patient is ready to leave the unit at discharge. (Call [phone] or [phone] after 5pm.) [facility] information and contact numbers given to patients nurse.      Please call with any hospice related questions.     Thank you for this referral.     Madelyn Flavors, RN, [facility] (listed on AMION under [facility])  [phone]

## 2019-12-21 NOTE — Consult Note (Signed)
Consultation Note Date: 12/21/2019   Patient Name: Margaret Elliott  DOB: 1951-06-06  MRN: [id number]  Age / Sex: 69 y.o., female  PCP: Minette Brine, [place] Referring Physician: Little Ishikawa, MD  Reason for Consultation: Establishing goals of care  HPI/Patient Profile: 69 y.o. female  with past medical history of colon cancer followed by Dr. Alen Blew with recent discontinuation of chemotherapy due to progression of disease.  There was consideration for salvage therapy, but she elected for trial of symptomatic therapy rather than another trial of salvage chemotherapy.  Admitted on 12/19/2019 with small bowel obstruction due to adhesions with omental metastasis.  She is status post NG tube placement and she has had some improvement with x-ray showing some resolution of obstruction.  Palliative consulted for goals of care.  Clinical Assessment and Goals of Care: I met today with Margaret Elliott and her 2 daughters.. We discussed clinical course as well as wishes moving forward in regard to  care plan in light of her advanced cancer with new bowel obstruction.  It does appear that the obstruction is resolving and hope is that this continues to be the case moving forward.   Concepts specific to code status and rehospitalization discussed.  We discussed difference between a aggressive medical intervention path and a palliative, comfort focused care path.  Values and goals of care important to patient and family were attempted to be elicited.  Overall, Margaret Elliott reports that she wants to be at home and has been in discussion with Dr. Alen Blew about working to begin hospice services.  Concept of Hospice and Palliative Care were discussed as well as the services they offer in the home.  She would like to discuss home hospice services further with hospice liaison.  I will place a consult to care management to help facilitate  this.  Questions and concerns addressed.   PMT will continue to support holistically.  SUMMARY OF RECOMMENDATIONS   - DNR/DNI - Reports her husband is her surrogate decision maker in the event she cannot make her own decisions. - Overall, her goal is to work to get back to home.  She is open to the support of hospice on discharge.  I will place an order for care management to help facilitate arranging hospice on discharge. -She would like to be home as soon as possible.  We discussed that we will need to work to get out NG tube and make sure that she is symptomatically controlled prior to discharge.  Hopefully this can be done in a short period of time as she would like to be home as soon as she can be discharged.  Code Status/Advance Care Planning:  DNR   Symptom Management:   Pain: Currently reports well controlled.  Continue morphine as needed  Nausea: Currently well controlled.  Continue antiemetics as needed.  Prognosis:   < 6 months if her disease follows its natural progression and she should qualify for home hospice services if so desired on discharge  Discharge Planning:Likely Home with Hospice  Primary Diagnoses: Present on Admission: . SBO (small bowel obstruction) ([facility]) . Anemia   I have reviewed the medical record, interviewed the patient and family, and examined the patient. The following aspects are pertinent.  Past Medical History:  Diagnosis Date  . Anemia   . Closed right hip fracture ([place]) 06/06/2019  . colon ca dx'd 11/2012  . History of chemotherapy    Social History   Socioeconomic History  . Marital status: Married    Spouse name: Not on file  . Number of children: 3  . Years of education: Not on file  . Highest education level: Not on file  Occupational History  . Occupation: Scientist, clinical (histocompatibility and immunogenetics): OTHER  Tobacco Use  . Smoking status: Former Smoker    Packs/day: 0.25    Years: 4.00    Pack years: 1.00    Types: Cigarettes    Quit  date: 06/22/1972    Years since quitting: 47.5  . Smokeless tobacco: Never Used  Vaping Use  . Vaping Use: Never used  Substance and Sexual Activity  . Alcohol use: Yes    Comment: wine a couple of times a week, occ  . Drug use: No  . Sexual activity: Not on file  Other Topics Concern  . Not on file  Social History Narrative  . Not on file   Social Determinants of Health   Financial Resource Strain:   . Difficulty of Paying Living Expenses:   Food Insecurity:   . Worried About Charity fundraiser in the Last Year:   . Arboriculturist in the Last Year:   Transportation Needs:   . Film/video editor (Medical):   Marland Kitchen Lack of Transportation (Non-Medical):   Physical Activity:   . Days of Exercise per Week:   . Minutes of Exercise per Session:   Stress:   . Feeling of Stress :   Social Connections:   . Frequency of Communication with Friends and Family:   . Frequency of Social Gatherings with Friends and Family:   . Attends Religious Services:   . Active Member of Clubs or Organizations:   . Attends Archivist Meetings:   Marland Kitchen Marital Status:    Family History  Problem Relation Age of Onset  . Heart disease Mother   . COPD Mother    Scheduled Meds: . Chlorhexidine Gluconate Cloth  6 each Topical Daily  . feeding supplement  1 Container Oral BID BM  . feeding supplement (PRO-STAT SUGAR FREE 64)  30 mL Oral BID  . multivitamin with minerals  1 tablet Oral Daily  . sodium chloride flush  10-40 mL Intracatheter Q12H   Continuous Infusions: . dextrose 5 % and 0.45% NaCl 75 mL/hr at 12/20/19 1826   PRN Meds:.morphine injection, ondansetron **OR** ondansetron (ZOFRAN) IV, phenol, sodium chloride flush Medications Prior to Admission:  Prior to Admission medications   Medication Sig Start Date End Date Taking? Authorizing Provider  calcium-vitamin D (OSCAL WITH D) 500-200 MG-UNIT tablet Take 1 tablet by mouth daily with breakfast. 06/10/19  Yes Autry-Lott, Naaman Plummer, DO   diphenoxylate-atropine (LOMOTIL) 2.5-0.025 MG tablet Take 1 tablet by mouth 4 (four) times daily as needed for diarrhea or loose stools. 11/10/19  Yes Wyatt Portela, MD  docusate sodium (COLACE) 100 MG capsule Take 1 capsule (100 mg total) by mouth every 12 (twelve) hours. Patient taking differently: Take 100 mg by mouth daily as needed for mild constipation.  08/27/19  Yes Sherwood Gambler,  MD  HYDROcodone-acetaminophen (NORCO) 5-325 MG tablet Take 1 tablet by mouth every 6 (six) hours as needed for moderate pain. Patient taking differently: Take 1 tablet by mouth every 6 (six) hours as needed for moderate pain or severe pain.  10/18/19  Yes Wyatt Portela, MD  lidocaine-prilocaine (EMLA) cream Apply 1 application topically as needed. Apply approx 1/2 tsp to skin over port, prior to chemotherapy treatments Patient taking differently: Apply 1 application topically as needed (access port). Apply approx 1/2 tsp to skin over port, prior to chemotherapy treatments 05/24/19  Yes Shadad, Mathis Dad, MD  LORazepam (ATIVAN) 1 MG tablet Take 1 tablet (1 mg total) by mouth every 6 (six) hours as needed for sleep. 09/06/19  Yes Wyatt Portela, MD  megestrol (MEGACE) 400 MG/10ML suspension Take 10 mLs (400 mg total) by mouth daily. 11/29/19  Yes Wyatt Portela, MD  morphine (MS CONTIN) 30 MG 12 hr tablet Take 1 tablet (30 mg total) by mouth every 12 (twelve) hours. 12/13/19  Yes Wyatt Portela, MD  Multiple Vitamin (MULTIVITAMIN ADULT PO) Take 1 tablet by mouth daily.   Yes [provider]  ondansetron (ZOFRAN) 8 MG tablet TAKE 1 TABLET BY MOUTH 3 TIMES A DAY AS NEEDED FOR NAUSEA Patient taking differently: Take 8 mg by mouth every 8 (eight) hours as needed for nausea or vomiting. TAKE 1 TABLET BY MOUTH 3 TIMES A DAY AS NEEDED FOR NAUSEA 11/30/19  Yes Shadad, Mathis Dad, MD  polyethylene glycol (MIRALAX / GLYCOLAX) 17 g packet Take 17 g by mouth daily. Patient taking differently: Take 17 g by mouth daily as needed  for moderate constipation.  08/27/19  Yes Sherwood Gambler, MD  prochlorperazine (COMPAZINE) 10 MG tablet Take 1 tablet (10 mg total) by mouth every 6 (six) hours as needed for nausea or vomiting. 09/20/19  Yes Shadad, Mathis Dad, MD  senna-docusate (SENOKOT-S) 8.6-50 MG tablet Take 1 tablet by mouth 2 (two) times daily. 11/29/19  Yes Wyatt Portela, MD  traMADol (ULTRAM) 50 MG tablet Take 1 tablet (50 mg total) by mouth every 6 (six) hours as needed. 11/29/19  Yes Wyatt Portela, MD  acetaminophen (TYLENOL) 500 MG tablet Take 500 mg by mouth every 6 (six) hours as needed for pain. Patient not taking: Reported on 12/19/2019    [provider]  lidocaine (XYLOCAINE) 2 % solution Use as directed 15 mLs in the mouth or throat as needed for mouth pain. Patient not taking: Reported on 12/19/2019 05/24/19   Wyatt Portela, MD  senna (SENOKOT) 8.6 MG TABS tablet Take 1 tablet (8.6 mg total) by mouth 2 (two) times daily. Patient not taking: Reported on 12/19/2019 08/28/19   Minette Brine, FNP  trifluridine-tipiracil (LONSURF) 20-8.19 MG tablet Take 3 tablets (60 mg of trifluridine total) by mouth 2 (two) times daily after a meal. 1 hr after AM & PM meals on days 1-5, 8-12. Repeat every 28day 11/16/19   Wyatt Portela, MD   No Known Allergies Review of Systems  Constitutional: Positive for activity change, appetite change and fatigue.  Gastrointestinal: Positive for abdominal distention, abdominal pain, constipation and nausea.  Neurological: Positive for weakness.  Psychiatric/Behavioral: Positive for sleep disturbance.    Physical Exam  General: Alert, awake, in no acute distress.  HEENT: No JVD, NGT in place Heart: Regular rate and rhythm. Lungs: Good air movement Abdomen: Soft, positive bowel sounds.  Ext: No significant edema Skin: Warm and dry Neuro: Grossly intact, nonfocal.  Vital Signs: BP 123/82 (BP Location: Right Arm)   Pulse 88   Temp 97.7 F (36.5 C) (Oral)   Resp (!) 22   Ht  _0  (1.626 m)   Wt 56.1 kg   SpO2 100%   BMI 21.23 kg/m  Pain Scale: 0-10   Pain Score: 3    SpO2: SpO2: 100 % O2 Device:SpO2: 100 % O2 Flow Rate: .O2 Flow Rate (L/min): 0 L/min  IO: Intake/output summary:   Intake/Output Summary (Last 24 hours) at 12/21/2019 1543 Last data filed at 12/21/2019 1500 Gross per 24 hour  Intake 1392.5 ml  Output 1100 ml  Net 292.5 ml    Time In: 1400 Time Out: 1500 Time Total: 60 Greater than 50%  of this time was spent counseling and coordinating care related to the above assessment and plan.  Signed by: Micheline Rough, MD   Please contact Palliative Medicine Team phone at [phone] for questions and concerns.  For individual provider: See Shea Evans

## 2019-12-22 ENCOUNTER — Inpatient Hospital Stay: Payer: Self-pay

## 2019-12-22 DIAGNOSIS — R110 Nausea: Secondary | ICD-10-CM

## 2019-12-22 LAB — BASIC METABOLIC PANEL
Anion gap: 8 (ref 5–15)
BUN: 7 mg/dL — ABNORMAL LOW (ref 8–23)
CO2: 26 mmol/L (ref 22–32)
Calcium: 8.4 mg/dL — ABNORMAL LOW (ref 8.9–10.3)
Chloride: 100 mmol/L (ref 98–111)
Creatinine, Ser: 0.57 mg/dL (ref 0.44–1.00)
GFR calc Af Amer: >60 mL/min (ref >60)
GFR calc non Af Amer: >60 mL/min (ref >60)
Glucose, Bld: 115 mg/dL — ABNORMAL HIGH (ref 70–99)
Potassium: 3.7 mmol/L (ref 3.5–5.1)
Sodium: 134 mmol/L — ABNORMAL LOW (ref 135–145)

## 2019-12-22 LAB — CBC
HCT: 31.4 % — ABNORMAL LOW (ref 36.0–46.0)
Hemoglobin: 10.1 g/dL — ABNORMAL LOW (ref 12.0–15.0)
MCH: 31.9 pg (ref 26.0–34.0)
MCHC: 32.2 g/dL (ref 30.0–36.0)
MCV: 99.1 fL (ref 80.0–100.0)
Platelets: 343 10*3/uL (ref 150–400)
RBC: 3.17 MIL/uL — ABNORMAL LOW (ref 3.87–5.11)
RDW: 14 % (ref 11.5–15.5)
WBC: 7.9 10*3/uL (ref 4.0–10.5)
nRBC: 0 % (ref 0.0–0.2)

## 2019-12-22 LAB — GLUCOSE, CAPILLARY
Glucose-Capillary: 102 mg/dL — ABNORMAL HIGH (ref 70–99)
Glucose-Capillary: 102 mg/dL — ABNORMAL HIGH (ref 70–99)
Glucose-Capillary: 130 mg/dL — ABNORMAL HIGH (ref 70–99)
Glucose-Capillary: 95 mg/dL (ref 70–99)

## 2019-12-22 MED ORDER — MORPHINE SULFATE (CONCENTRATE) 10 MG/0.5ML PO SOLN
5.0000 mg | ORAL | Status: DC | PRN
  Administered 2019-12-22: 5 mg via ORAL
  Administered 2019-12-23: 10 mg via ORAL
  Filled 2019-12-22 (×2): qty 0.5

## 2019-12-22 MED ORDER — SODIUM CHLORIDE 0.9 % IV SOLN: 250.0000 mL | INTRAVENOUS | Status: DC | PRN

## 2019-12-22 MED ORDER — SODIUM CHLORIDE 0.9% FLUSH
3.0000 mL | Freq: Two times a day (BID) | INTRAVENOUS | Status: DC
  Administered 2019-12-22: 3 mL via INTRAVENOUS

## 2019-12-22 MED ORDER — SODIUM CHLORIDE 0.9% FLUSH: 3.0000 mL | INTRAVENOUS | Status: DC | PRN

## 2019-12-22 NOTE — Progress Notes (Signed)
PROGRESS NOTE    Margaret Elliott  PIR:[id number] DOB: [birth year] DOA: 12/19/2019 PCP: Minette Brine, FNP   Brief Narrative:  Margaret Elliott is a 69 y.o. female with history of colon cancer being followed by Alen Blew recently discontinued chemotherapy due to progression of disease considering salvage therapy with previous history of right hemicolectomy and anastomosis presents to the EDR because of worsening abdominal pain distention with normal bowel movement for the last 2 days.  Started vomiting today.  Denies any fever chills chest pain or shortness of breath. In the ED patient CT abdomen pelvis is consistent with small bowel obstruction either due to ideations or omental metastasis.  In addition it also shows progression of the metastatic disease.  ER physician discussed with on-call general surgeon Dr. Ninfa Linden who advised NG tube suction and will be seeing patient in consult.  Labs are remarkable for sodium of 127.  Albumin 3.1 hemoglobin 10.4.  Covid test was negative.  Assessment & Plan:   Principal Problem:   SBO (small bowel obstruction) (HCC) Active Problems:   Hyponatremia   Anemia  Small bowel obstruction either due to adhesions vs omental metastasis, improving  - History of colon cancer being followed by Dr. Alen Blew oncologist -Patient tolerating clears, advance to full liquids now tolerating quite well NG tube to be removed later this afternoon if p.o. tolerance continues without issue - General surgery following, given findings likely nonsurgical  - Patient was recommended palliative gastrostomy tube - continues to decline - Small bowel follow-through series shows improving obstruction  Hypovolemic hyponatremia, resolving   - Improving with IV fluids, continue to advance diet as tolerated - Continue to follow morning labs  Anemia appears to be chronic likely related to malignancy.  - Stable, likely at baseline, follow repeat labs  Chronic pain in the setting of above -  Continue home medications, presently on IV morphine for breakthrough pain as well  DVT prophylaxis: SCDs Code Status: Full code Family Communication: None present  Status is: Inpatient Dispo: The patient is from: Home              Anticipated d/c is to: To be determined              Anticipated d/c date is: 24-48 hours              Patient currently not medically stable for discharge to need for ongoing symptom control, possible need for procedure or surgery pending clinical course  Consultants:   General surgery, oncology  Procedures:   None planned currently  Antimicrobials:  None indicated  Subjective: No acute issues or events overnight, abdominal pain nausea have drastically improved with current regimen -currently tolerating breakfast without any difficulty requesting NG tube removal.  Denies chest pain, shortness of breath, headache, fevers, chills.  Objective: Vitals:   12/21/19 0433 12/21/19 1325 12/21/19 2237 12/22/19 0542  BP: 140/86 123/82 133/84 119/79  Pulse: 86 88 90 86  Resp: 16 (!) 22 16 16   Temp: 97.9 F (36.6 C) 97.7 F (36.5 C) 98.7 F (37.1 C) 98.3 F (36.8 C)  TempSrc: Oral Oral Oral Oral  SpO2: 99% 100% 100%   Weight:      Height:        Intake/Output Summary (Last 24 hours) at 12/22/2019 1243 Last data filed at 12/22/2019 1044 Gross per 24 hour  Intake 1600 ml  Output 1550 ml  Net 50 ml   Filed Weights   12/19/19 1740 12/20/19 0227  Weight: 52.6  kg 56.1 kg    Examination:  General:  Pleasantly resting in bed, No acute distress. HEENT:  Normocephalic atraumatic.  Sclerae nonicteric, noninjected.  Extraocular movements intact bilaterally.  NG tube right nostril, clamped. Neck:  Without mass or deformity.  Trachea is midline. Lungs:  Clear to auscultate bilaterally without rhonchi, wheeze, or rales. Heart:  Regular rate and rhythm.  Without murmurs, rubs, or gallops. Abdomen:  Soft, minimally tender, nondistended Extremities: Without  cyanosis, clubbing, edema, or obvious deformity. Vascular:  Dorsalis pedis and posterior tibial pulses palpable bilaterally. Skin:  Warm and dry, no erythema, no ulcerations.  Data Reviewed: I have personally reviewed following labs and imaging studies  CBC: Recent Labs  Lab 12/19/19 2212 12/20/19 0420 12/21/19 0200 12/22/19 0454  WBC 8.1 8.6 7.9 7.9  HGB 10.4* 9.9* 9.9* 10.1*  HCT 32.1* 30.2* 31.1* 31.4*  MCV 97.9 99.0 100.3* 99.1  PLT 415* 366 377 650   Basic Metabolic Panel: Recent Labs  Lab 12/19/19 2212 12/20/19 0420 12/21/19 0200 12/22/19 0454  NA 127* 128* 133* 134*  K 5.0 5.0 3.9 3.7  CL 89* 93* 99 100  CO2 25 23 26 26   GLUCOSE 102* 85 83 115*  BUN 16 14 10  7*  CREATININE 0.75 0.69 0.55 0.57  CALCIUM 9.1 8.8* 8.2* 8.4*   GFR: Estimated Creatinine Clearance: 57.3 mL/min (by C-G formula based on SCr of 0.57 mg/dL). Liver Function Tests: Recent Labs  Lab 12/19/19 2212 12/20/19 0420  AST 21 19  ALT 14 13  ALKPHOS 115 111  BILITOT 0.6 0.8  PROT 7.8 6.8  ALBUMIN 3.1* 2.7*   Recent Labs  Lab 12/19/19 2212  LIPASE 23   No results for input(s): AMMONIA in the last 168 hours. Coagulation Profile: No results for input(s): INR, PROTIME in the last 168 hours. Cardiac Enzymes: No results for input(s): CKTOTAL, CKMB, CKMBINDEX, TROPONINI in the last 168 hours. BNP (last 3 results) No results for input(s): PROBNP in the last 8760 hours. HbA1C: No results for input(s): HGBA1C in the last 72 hours. CBG: Recent Labs  Lab 12/21/19 0139 12/21/19 0843 12/21/19 1601 12/22/19 0036 12/22/19 0807  GLUCAP 85 85 107* 102* 102*   Lipid Profile: No results for input(s): CHOL, HDL, LDLCALC, TRIG, CHOLHDL, LDLDIRECT in the last 72 hours. Thyroid Function Tests: No results for input(s): TSH, T4TOTAL, FREET4, T3FREE, THYROIDAB in the last 72 hours. Anemia Panel: No results for input(s): VITAMINB12, FOLATE, FERRITIN, TIBC, IRON, RETICCTPCT in the last 72  hours. Sepsis Labs: No results for input(s): PROCALCITON, LATICACIDVEN in the last 168 hours.  Recent Results (from the past 240 hour(s))  SARS Coronavirus 2 by RT PCR (hospital order, performed in [facility] hospital lab) Nasopharyngeal Nasopharyngeal Swab     Status: None   Collection Time: 12/20/19  1:30 AM   Specimen: Nasopharyngeal Swab  Result Value Ref Range Status   SARS Coronavirus 2 NEGATIVE NEGATIVE Final    Comment: (NOTE) SARS-CoV-2 target nucleic acids are NOT DETECTED.  The SARS-CoV-2 RNA is generally detectable in upper and lower respiratory specimens during the acute phase of infection. The lowest concentration of SARS-CoV-2 viral copies this assay can detect is 250 copies / mL. A negative result does not preclude SARS-CoV-2 infection and should not be used as the sole basis for treatment or other patient management decisions.  A negative result may occur with improper specimen collection / handling, submission of specimen other than nasopharyngeal swab, presence of viral mutation(s) within the areas targeted by this  assay, and inadequate number of viral copies (<250 copies / mL). A negative result must be combined with clinical observations, patient history, and epidemiological information.  Fact Sheet for Patients:   StrictlyIdeas.no  Fact Sheet for Healthcare Providers: BankingDealers.co.za  This test is not yet approved or  cleared by the [place] FDA and has been authorized for detection and/or diagnosis of SARS-CoV-2 by FDA under an Emergency Use Authorization (EUA).  This EUA will remain in effect (meaning this test can be used) for the duration of the COVID-19 declaration under Section 564(b)(1) of the Act, 21 U.S.C. section 360bbb-3(b)(1), unless the authorization is terminated or revoked sooner.  Performed at [facility], [place] [address] [place], [place] [address]      Radiology Studies: DG Abd Portable 1V-Small Bowel Obstruction Protocol-initial, 8 hr delay  Result Date: 12/20/2019 CLINICAL DATA:  Small bowel obstruction EXAM: PORTABLE ABDOMEN - 1 VIEW COMPARISON:  12/20/2019 FINDINGS: NG tube is transpyloric with the tip in the descending duodenum. Oral contrast material is seen within the colon. Several mildly prominent small bowel loops are noted centrally. No free air organomegaly. IMPRESSION: Partial small bowel obstruction pattern improved since prior study. NG tube tip is in the descending duodenum. Electronically Signed   By: Rolm Baptise M.D.   On: 12/20/2019 20:53   [place] EKG SITE RITE  Result Date: 12/22/2019 If Site Rite image not attached, placement could not be confirmed due to current cardiac rhythm.   Scheduled Meds: . Chlorhexidine Gluconate Cloth  6 each Topical Daily  . feeding supplement  1 Container Oral BID BM  . feeding supplement (PRO-STAT SUGAR FREE 64)  30 mL Oral BID  . multivitamin with minerals  1 tablet Oral Daily  . sodium chloride flush  10-40 mL Intracatheter Q12H  . sodium chloride flush  3 mL Intravenous Q12H   Continuous Infusions: . sodium chloride       LOS: 2 days   Time spent: 4min  Graci Hulce C Leeam Cedrone, DO [facility]  If 7PM-7AM, please contact night-coverage [email or web address]  12/22/2019, 12:43 PM

## 2019-12-22 NOTE — Care Management Important Message (Signed)
Important Message  Patient Details IM Letter given to [place] Case Manager to present to the Patient Name: Margaret Elliott MRN: [id number] Date of Birth: [birth year]   Medicare Important Message Given:  Yes     Kerin Salen 12/22/2019, 12:00 PM

## 2019-12-22 NOTE — Plan of Care (Signed)
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
## 2019-12-22 NOTE — Progress Notes (Signed)
[facility] Progress Note     Subjective: Patient denies abdominal pain. Denies nausea and has been tolerating CLD with NGT clamped. She feels a little bloated still. +flatus, no BM yet. She would like to leave NGT for now and possibly remove this afternoon if doing well  Objective: Vital signs in last 24 hours: Temp:  [97.7 F (36.5 C)-98.7 F (37.1 C)] 98.3 F (36.8 C) (07/02 0542) Pulse Rate:  [86-90] 86 (07/02 0542) Resp:  [16-22] 16 (07/02 0542) BP: (119-133)/(79-84) 119/79 (07/02 0542) SpO2:  [100 %] 100 % (07/01 2237) Last BM Date: 12/17/19  Intake/Output from previous day: 07/01 0701 - 07/02 0700 In: 2392.5 [P.O.:690; I.V.:1702.5] Out: 1450 [Urine:1150; Emesis/NG output:300] Intake/Output this shift: Total I/O In: -  Out: 400 [Urine:400]  PE: General: pleasant, WD,cachectic female who is laying in bed in NAD HEENT: Sclera are noninjected. PERRL. Ears and nose without any masses or lesions. Mouth is pink and moist Heart: regular, rate, and rhythm. Normal s1,s2. No obvious murmurs, gallops, or rubs noted. Palpable radial and pedal pulses bilaterally Lungs: CTAB, no wheezes, rhonchi, or rales noted. Respiratory effort nonlabored Abd: soft,nontender, mildly distended, +BS,NGT present and clamped, palpable abdominal wall tumors MS: all 4 extremities are symmetrical with no cyanosis, clubbing, or edema. Skin: warm and dry with no masses, lesions, or rashes Neuro: Cranial nerves 2-12 grossly intact, sensation grossly intact throughout Psych: A&Ox3 with an appropriate affect.   Lab Results:  Recent Labs    12/21/19 0200 12/22/19 0454  WBC 7.9 7.9  HGB 9.9* 10.1*  HCT 31.1* 31.4*  PLT 377 343   BMET Recent Labs    12/21/19 0200 12/22/19 0454  NA 133* 134*  K 3.9 3.7  CL 99 100  CO2 26 26  GLUCOSE 83 115*  BUN 10 7*  CREATININE 0.55 0.57  CALCIUM 8.2* 8.4*   PT/INR No results for input(s): LABPROT, INR in the last 72 hours. CMP      Component Value Date/Time   NA 134 (L) 12/22/2019 0454   NA 141 05/26/2017 0809   K 3.7 12/22/2019 0454   K 4.0 05/26/2017 0809   CL 100 12/22/2019 0454   CO2 26 12/22/2019 0454   CO2 23 05/26/2017 0809   GLUCOSE 115 (H) 12/22/2019 0454   GLUCOSE 95 05/26/2017 0809   BUN 7 (L) 12/22/2019 0454   BUN 14.5 05/26/2017 0809   CREATININE 0.57 12/22/2019 0454   CREATININE 0.83 12/13/2019 0855   CREATININE 1.0 05/26/2017 0809   CALCIUM 8.4 (L) 12/22/2019 0454   CALCIUM 9.4 05/26/2017 0809   PROT 6.8 12/20/2019 0420   PROT 7.2 05/26/2017 0809   ALBUMIN 2.7 (L) 12/20/2019 0420   ALBUMIN 3.5 05/26/2017 0809   AST 19 12/20/2019 0420   AST 29 12/13/2019 0855   AST 18 05/26/2017 0809   ALT 13 12/20/2019 0420   ALT 28 12/13/2019 0855   ALT 11 05/26/2017 0809   ALKPHOS 111 12/20/2019 0420   ALKPHOS 59 05/26/2017 0809   BILITOT 0.8 12/20/2019 0420   BILITOT 0.5 12/13/2019 0855   BILITOT 1.11 05/26/2017 0809   GFRNONAA >60 12/22/2019 0454   GFRNONAA >60 12/13/2019 0855   GFRNONAA 57 (L) 02/12/2015 0706   GFRAA >60 12/22/2019 0454   GFRAA >60 12/13/2019 0855   GFRAA 65 02/12/2015 0706   Lipase     Component Value Date/Time   LIPASE 23 12/19/2019 2212       Studies/Results: DG Abd Portable 1V-Small Bowel Obstruction Protocol-initial,  8 hr delay  Result Date: 12/20/2019 CLINICAL DATA:  Small bowel obstruction EXAM: PORTABLE ABDOMEN - 1 VIEW COMPARISON:  12/20/2019 FINDINGS: NG tube is transpyloric with the tip in the descending duodenum. Oral contrast material is seen within the colon. Several mildly prominent small bowel loops are noted centrally. No free air organomegaly. IMPRESSION: Partial small bowel obstruction pattern improved since prior study. NG tube tip is in the descending duodenum. Electronically Signed   By: Rolm Baptise M.D.   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 Adhesive - patient tolerating CLD without nausea or vomiting and passing flatus - she would like to leave NGT until this afternoon and see if she is able to have a BM - would really not recommend surgical intervention in this patient, I do not think that an operation will improve this patient's quality of life and it may honestly worsen it - if unable to improve with conservative management, would recommend IR vs GI placement of palliative gastrostomy for venting prn - appreciated palliative care and referral to hospice services  FEN: clamp NGT and trial CLD, IVF VTE: SCDs ID: no abx indicated at this time  LOS: 2 days    Norm Parcel , [facility] 12/22/2019, 9:01 AM Please see Amion for pager number during day hours 7:00am-4:30pm

## 2019-12-22 NOTE — Progress Notes (Signed)
Events noted.  She continues to have slow recovery from her small bowel obstruction.  I appreciate input of palliative medicine team and I agree with the recommendation.  DNR CODE STATUS and hospice upon discharge is very appropriate given her overall malignancy.  She will continue with supportive care management and when she is able to tolerate oral intake she will be discharged this.  The care of the hospitalist team is greatly appreciated.

## 2019-12-23 DIAGNOSIS — Z515 Encounter for palliative care: Secondary | ICD-10-CM

## 2019-12-23 LAB — CBC
HCT: 30.8 % — ABNORMAL LOW (ref 36.0–46.0)
Hemoglobin: 9.8 g/dL — ABNORMAL LOW (ref 12.0–15.0)
MCH: 31.8 pg (ref 26.0–34.0)
MCHC: 31.8 g/dL (ref 30.0–36.0)
MCV: 100 fL (ref 80.0–100.0)
Platelets: 327 10*3/uL (ref 150–400)
RBC: 3.08 MIL/uL — ABNORMAL LOW (ref 3.87–5.11)
RDW: 14.2 % (ref 11.5–15.5)
WBC: 7.8 10*3/uL (ref 4.0–10.5)
nRBC: 0 % (ref 0.0–0.2)

## 2019-12-23 LAB — BASIC METABOLIC PANEL
Anion gap: 9 (ref 5–15)
BUN: 7 mg/dL — ABNORMAL LOW (ref 8–23)
CO2: 25 mmol/L (ref 22–32)
Calcium: 8.2 mg/dL — ABNORMAL LOW (ref 8.9–10.3)
Chloride: 100 mmol/L (ref 98–111)
Creatinine, Ser: 0.59 mg/dL (ref 0.44–1.00)
GFR calc Af Amer: >60 mL/min (ref >60)
GFR calc non Af Amer: >60 mL/min (ref >60)
Glucose, Bld: 101 mg/dL — ABNORMAL HIGH (ref 70–99)
Potassium: 3.6 mmol/L (ref 3.5–5.1)
Sodium: 134 mmol/L — ABNORMAL LOW (ref 135–145)

## 2019-12-23 LAB — GLUCOSE, CAPILLARY: Glucose-Capillary: 99 mg/dL (ref 70–99)

## 2019-12-23 MED ORDER — MORPHINE SULFATE (CONCENTRATE) 10 MG/0.5ML PO SOLN: 5.0000 mg | ORAL | 0 refills | Status: AC | PRN

## 2019-12-23 MED ORDER — HEPARIN SOD (PORK) LOCK FLUSH 100 UNIT/ML IV SOLN
500.0000 [IU] | INTRAVENOUS | Status: AC | PRN
  Administered 2019-12-23: 500 [IU]
  Filled 2019-12-23: qty 5

## 2019-12-23 MED ORDER — PROMETHAZINE HCL 25 MG RE SUPP: 25.0000 mg | Freq: Four times a day (QID) | RECTAL | 1 refills | Status: AC | PRN

## 2019-12-23 MED ORDER — PRO-STAT SUGAR FREE PO LIQD: 30.0000 mL | Freq: Two times a day (BID) | ORAL | 0 refills | Status: AC

## 2019-12-23 NOTE — Discharge Summary (Signed)
Physician Discharge Summary  Margaret Elliott DJS:[id number] DOB: [birth year] DOA: 12/19/2019  PCP: Minette Brine, FNP  Admit date: 12/19/2019 Discharge date: 12/23/2019  Admitted From: Home Disposition: Home with hospice  Discharge Condition: Guarded CODE STATUS: DNR Diet recommendation: As tolerated  Brief/Interim Summary: Margaret Elliott a 68 y.o.femalewithhistory of colon cancer being followed by Al[facility] Blew recently discontinued chemotherapy due to progression of disease considering salvage therapy with previous history of right hemicolectomy and anastomosis presents to the EDR because of worsening abdominal pain distention with normal bowel movement for the last 2 days. Started vomiting today. Denies any fever chills chest pain or shortness of breath. In the ED patient CT abdomen pelvis is consistent with small bowel obstruction either due to ideations or omental metastasis. In addition it also shows progression of the metastatic disease. ER physician discussed with on-call general surgeon Dr. Ninfa Linden who advised NG tube suction and will be seeing patient in consult. Labs are remarkable for sodium of 127. Albumin 3.1 hemoglobin 10.4.   Patient met as above with acute intractable nausea vomiting and abdominal pain in the setting of advancing abdominal cancer and concern for metastatic disease and obstruction.  Fortunately patient was able to be decompressed with NG tube now tolerating full liquid diet and soft diet without any difficulties, multiple bowel movements over the past 24 hours.  After further discussion with family and palliative care and patient has agreed to follow along with hospice given worsening prognosis and limited treatment options, as she continues to follow with oncology.  Space recommending oral morphine, lorazepam and Haldol to outpatient hospice care as tolerated, we have discharge patient on liquid morphine for now appears to be well controlled otherwise.  Daughter  at bedside indicates patient appears quite well, essentially back to baseline otherwise agreeable stable for discharge.  Discharge Diagnoses:  Principal Problem:   SBO (small bowel obstruction) (HCC) Active Problems:   Hyponatremia   Anemia   Hospice care patient   Palliative care patient    Discharge Instructions  Discharge Instructions    Diet - low sodium heart healthy   Complete by: As directed    Increase activity slowly   Complete by: As directed      Allergies as of 12/23/2019   No Known Allergies     Medication List    STOP taking these medications   acetaminophen 500 MG tablet Commonly known as: TYLENOL   calcium-vitamin D 500-200 MG-UNIT tablet Commonly known as: OSCAL WITH D   docusate sodium 100 MG capsule Commonly known as: COLACE   HYDROcodone-acetaminophen 5-325 MG tablet Commonly known as: Norco   megestrol 400 MG/10ML suspension Commonly known as: MEGACE   morphine 30 MG 12 hr tablet Commonly known as: MS CONTIN Replaced by: morphine CONCENTRATE 10 MG/0.5ML Soln concentrated solution   MULTIVITAMIN ADULT PO   senna 8.6 MG Tabs tablet Commonly known as: SENOKOT   traMADol 50 MG tablet Commonly known as: ULTRAM     TAKE these medications   diphenoxylate-atropine 2.5-0.025 MG tablet Commonly known as: Lomotil Take 1 tablet by mouth 4 (four) times daily as needed for diarrhea or loose stools.   feeding supplement (PRO-STAT SUGAR FREE 64) Liqd Take 30 mLs by mouth 2 (two) times daily.   lidocaine 2 % solution Commonly known as: XYLOCAINE Use as directed 15 mLs in the mouth or throat as needed for mouth pain.   lidocaine-prilocaine cream Commonly known as: EMLA Apply 1 application topically as needed. Apply approx 1/2 tsp to  skin over port, prior to chemotherapy treatments What changed: reasons to take this   Lonsurf 20-8.19 MG tablet Generic drug: trifluridine-tipiracil Take 3 tablets (60 mg of trifluridine total) by mouth 2 (two)  times daily after a meal. 1 hr after AM & PM meals on days 1-5, 8-12. Repeat every 28day   LORazepam 1 MG tablet Commonly known as: ATIVAN Take 1 tablet (1 mg total) by mouth every 6 (six) hours as needed for sleep.   morphine CONCENTRATE 10 MG/0.5ML Soln concentrated solution Take 0.25-0.5 mLs (5-10 mg total) by mouth every 2 (two) hours as needed for up to 7 days for moderate pain or severe pain. Replaces: morphine 30 MG 12 hr tablet   ondansetron 8 MG tablet Commonly known as: ZOFRAN TAKE 1 TABLET BY MOUTH 3 TIMES A DAY AS NEEDED FOR NAUSEA What changed:   how much to take  how to take this  when to take this  reasons to take this   polyethylene glycol 17 g packet Commonly known as: MIRALAX / GLYCOLAX Take 17 g by mouth daily. What changed:   when to take this  reasons to take this   prochlorperazine 10 MG tablet Commonly known as: COMPAZINE Take 1 tablet (10 mg total) by mouth every 6 (six) hours as needed for nausea or vomiting.   promethazine 25 MG suppository Commonly known as: Phenergan Place 1 suppository (25 mg total) rectally every 6 (six) hours as needed for nausea.   senna-docusate 8.6-50 MG tablet Commonly known as: Senokot-S Take 1 tablet by mouth 2 (two) times daily.       No Known Allergies  Consultations:  Palliative care, surgery, oncology  Procedures/Studies: DG Abdomen 1 View  Result Date: 12/20/2019 CLINICAL DATA:  Enteric catheter placement EXAM: ABDOMEN - 1 VIEW COMPARISON:  12/19/2019 FINDINGS: Supine frontal view of the abdomen and pelvis demonstrates enteric catheter tip and side port projecting over proximal duodenum. Gaseous distention of the small bowel consistent with obstruction, unchanged. Excreted contrast within the kidneys, ureters, and bladder. IMPRESSION: 1. Enteric catheter overlying proximal duodenum. 2. Continued small bowel obstruction. Electronically Signed   By: Sharlet Salina M.D.   On: 12/20/2019 02:19   CT  ABDOMEN PELVIS W CONTRAST  Result Date: 12/19/2019 CLINICAL DATA:  Metastatic colon cancer, abdominal pain and distension EXAM: CT ABDOMEN AND PELVIS WITH CONTRAST TECHNIQUE: Multidetector CT imaging of the abdomen and pelvis was performed using the standard protocol following bolus administration of intravenous contrast. CONTRAST:  OMNIPAQUE IOHEXOL 300 MG/ML  SOLN COMPARISON:  11/10/2019 FINDINGS: Lower chest: Minimal hypoventilatory changes at the lung bases. No acute pleural or parenchymal lung disease. Hepatobiliary: Stable hypodensity posterior right lobe liver consistent with previous ablation. This measures approximately 4.7 x 2.1 cm on today's exam. There are new subcapsular hypodensities within the right lobe liver worrisome for disease progression. On image 18 there is a 1.5 cm hypodensity and image 37 there is a 2 cm hypodensity. The gallbladder is unremarkable. Pancreas: Unremarkable. No pancreatic ductal dilatation or surrounding inflammatory changes. Spleen: Normal in size without focal abnormality. Adrenals/Urinary Tract: Stable right renal cysts. No urinary tract calculi or obstruction. Bladder is unremarkable. The adrenals are normal. Stomach/Bowel: There is marked distension of the jejunum, measuring up to 3.9 cm in diameter. Transition within the lower pelvis to normal caliber distal jejunum and ileum. Gas and stool are seen within the colon. Vascular/Lymphatic: Aortic atherosclerosis. No pathologic adenopathy is identified. Reproductive: Status post hysterectomy. No adnexal masses. Other: Small volume  ascites within the lower abdomen and pelvis, increased since prior study. No free intra-abdominal gas. Enlarging mesenteric metastatic disease is noted. Left upper quadrant omental mass measures approximately 11.1 x 2.2 cm. Soft tissue mass in the rectus sheath measures 4.1 x 2.3 cm. Peritoneal thickening in the right lower pelvis is again noted, not significantly changed. Musculoskeletal:  Postsurgical changes right hip. No acute or destructive bony lesions. Reconstructed images demonstrate no additional findings. IMPRESSION: 1. High-grade small bowel obstruction, with transition point in the lower pelvis. This could be due to adhesions or omental metastases. 2. Progression of metastatic disease, with multiple new liver lesions and enlarging omental and abdominal wall metastases. 3. Small volume ascites within the lower abdomen and pelvis, increased since prior study. 4. Aortic Atherosclerosis (ICD10-I70.0). Electronically Signed   By: Randa Ngo M.D.   On: 12/19/2019 23:49   CT L-SPINE NO CHARGE  Result Date: 12/19/2019 CLINICAL DATA:  Metastatic colon cancer, abdominal pain and distension EXAM: CT LUMBAR SPINE WITHOUT CONTRAST TECHNIQUE: Multidetector CT imaging of the lumbar spine was performed without intravenous contrast administration. Multiplanar CT image reconstructions were also generated. COMPARISON:  08/27/2019 FINDINGS: Segmentation: 5 lumbar type vertebrae. Alignment: Minimal grade 1 anterolisthesis of L4 on L5. No pars defects. Otherwise alignment is anatomic. Vertebrae: No acute or destructive bony lesions. Paraspinal and other soft tissues: Paraspinal soft tissues are unremarkable. Please refer to CT abdomen report describing high-grade small bowel obstruction and progressive metastatic colon cancer. Disc levels: Marked facet hypertrophy at L4-5 and L5-S1. Prominent spondylosis at L5-S1. IMPRESSION: 1. Lower lumbar spondylosis and facet hypertrophy. No acute or destructive bony lesion. Electronically Signed   By: Randa Ngo M.D.   On: 12/19/2019 23:51   DG Abd Portable 1V-Small Bowel Obstruction Protocol-initial, 8 hr delay  Result Date: 12/20/2019 CLINICAL DATA:  Small bowel obstruction EXAM: PORTABLE ABDOMEN - 1 VIEW COMPARISON:  12/20/2019 FINDINGS: NG tube is transpyloric with the tip in the descending duodenum. Oral contrast material is seen within the colon.  Several mildly prominent small bowel loops are noted centrally. No free air organomegaly. IMPRESSION: Partial small bowel obstruction pattern improved since prior study. NG tube tip is in the descending duodenum. Electronically Signed   By: Rolm Baptise M.D.   On: 12/20/2019 20:53   [place] EKG SITE RITE  Result Date: 12/22/2019 If Site Rite image not attached, placement could not be confirmed due to current cardiac rhythm.    Subjective: Issues or events overnight, patient tolerating p.o. much more adequately today, multiple bowel movements overnight without incident.  Pain is much more well controlled denies any ongoing nausea or vomiting.  Otherwise denies shortness of breath, chest pain, headache, fevers, chills.   Discharge Exam: Vitals:   12/22/19 2055 12/23/19 0527  BP: 120/75 133/88  Pulse: 100 92  Resp: 14 16  Temp: 98.6 F (37 C) 97.9 F (36.6 C)  SpO2: 99% 100%   Vitals:   12/22/19 1503 12/22/19 2055 12/23/19 0500 12/23/19 0527  BP: (!) 126/91 120/75  133/88  Pulse: (!) 101 100  92  Resp: '17 14  16  '$ Temp: 98.7 F (37.1 C) 98.6 F (37 C)  97.9 F (36.6 C)  TempSrc:    Oral  SpO2: 100% 99%  100%  Weight:   55.4 kg   Height:        General: Pt is alert, awake, not in acute distress Cardiovascular: RRR, S1/S2 +, no rubs, no gallops Respiratory: CTA bilaterally, no wheezing, no rhonchi Abdominal: Soft, NT,  ND, bowel sounds + Extremities: no edema, no cyanosis    The results of significant diagnostics from this hospitalization (including imaging, microbiology, ancillary and laboratory) are listed below for reference.     Microbiology: Recent Results (from the past 240 hour(s))  SARS Coronavirus 2 by RT PCR (hospital order, performed in [facility] hospital lab) Nasopharyngeal Nasopharyngeal Swab     Status: None   Collection Time: 12/20/19  1:30 AM   Specimen: Nasopharyngeal Swab  Result Value Ref Range Status   SARS Coronavirus 2 NEGATIVE NEGATIVE Final     Comment: (NOTE) SARS-CoV-2 target nucleic acids are NOT DETECTED.  The SARS-CoV-2 RNA is generally detectable in upper and lower respiratory specimens during the acute phase of infection. The lowest concentration of SARS-CoV-2 viral copies this assay can detect is 250 copies / mL. A negative result does not preclude SARS-CoV-2 infection and should not be used as the sole basis for treatment or other patient management decisions.  A negative result may occur with improper specimen collection / handling, submission of specimen other than nasopharyngeal swab, presence of viral mutation(s) within the areas targeted by this assay, and inadequate number of viral copies (<250 copies / mL). A negative result must be combined with clinical observations, patient history, and epidemiological information.  Fact Sheet for Patients:   StrictlyIdeas.no  Fact Sheet for Healthcare Providers: BankingDealers.co.za  This test is not yet approved or  cleared by the [place] FDA and has been authorized for detection and/or diagnosis of SARS-CoV-2 by FDA under an Emergency Use Authorization (EUA).  This EUA will remain in effect (meaning this test can be used) for the duration of the COVID-19 declaration under Section 564(b)(1) of the Act, 21 U.S.C. section 360bbb-3(b)(1), unless the authorization is terminated or revoked sooner.  Performed at [facility], [place] [address] [place], [place] [address]      Labs: BNP (last 3 results) No results for input(s): BNP in the last 8760 hours. Basic Metabolic Panel: Recent Labs  Lab 12/19/19 2212 12/20/19 0420 12/21/19 0200 12/22/19 0454 12/23/19 0325  NA 127* 128* 133* 134* 134*  K 5.0 5.0 3.9 3.7 3.6  CL 89* 93* 99 100 100  CO2 '25 23 26 26 25  '$ GLUCOSE 102* 85 83 115* 101*  BUN '16 14 10 '$ 7* 7*  CREATININE 0.75 0.69 0.55 0.57 0.59  CALCIUM 9.1 8.8* 8.2* 8.4* 8.2*   Liver  Function Tests: Recent Labs  Lab 12/19/19 2212 12/20/19 0420  AST 21 19  ALT 14 13  ALKPHOS 115 111  BILITOT 0.6 0.8  PROT 7.8 6.8  ALBUMIN 3.1* 2.7*   Recent Labs  Lab 12/19/19 2212  LIPASE 23   No results for input(s): AMMONIA in the last 168 hours. CBC: Recent Labs  Lab 12/19/19 2212 12/20/19 0420 12/21/19 0200 12/22/19 0454 12/23/19 0325  WBC 8.1 8.6 7.9 7.9 7.8  HGB 10.4* 9.9* 9.9* 10.1* 9.8*  HCT 32.1* 30.2* 31.1* 31.4* 30.8*  MCV 97.9 99.0 100.3* 99.1 100.0  PLT 415* 366 377 343 327   Cardiac Enzymes: No results for input(s): CKTOTAL, CKMB, CKMBINDEX, TROPONINI in the last 168 hours. BNP: Invalid input(s): POCBNP CBG: Recent Labs  Lab 12/22/19 0036 12/22/19 0807 12/22/19 1554 12/22/19 2350 12/23/19 0733  GLUCAP 102* 102* 130* 95 99   D-Dimer No results for input(s): DDIMER in the last 72 hours. Hgb A1c No results for input(s): HGBA1C in the last 72 hours. Lipid Profile No results for input(s): CHOL, HDL,  LDLCALC, TRIG, CHOLHDL, LDLDIRECT in the last 72 hours. Thyroid function studies No results for input(s): TSH, T4TOTAL, T3FREE, THYROIDAB in the last 72 hours.  Invalid input(s): FREET3 Anemia work up No results for input(s): VITAMINB12, FOLATE, FERRITIN, TIBC, IRON, RETICCTPCT in the last 72 hours. Urinalysis    Component Value Date/Time   COLORURINE YELLOW 12/20/2019 0230   APPEARANCEUR CLEAR 12/20/2019 0230   LABSPEC 1.040 (H) 12/20/2019 0230   PHURINE 5.0 12/20/2019 0230   GLUCOSEU NEGATIVE 12/20/2019 0230   HGBUR NEGATIVE 12/20/2019 0230   BILIRUBINUR NEGATIVE 12/20/2019 0230   BILIRUBINUR neg 09/30/2012 1628   KETONESUR 5 (A) 12/20/2019 0230   PROTEINUR NEGATIVE 12/20/2019 0230   UROBILINOGEN 0.2 11/30/2014 1807   NITRITE NEGATIVE 12/20/2019 0230   LEUKOCYTESUR NEGATIVE 12/20/2019 0230   Sepsis Labs Invalid input(s): PROCALCITONIN,  WBC,  LACTICIDVEN Microbiology Recent Results (from the past 240 hour(s))  SARS Coronavirus 2  by RT PCR (hospital order, performed in [place] hospital lab) Nasopharyngeal Nasopharyngeal Swab     Status: None   Collection Time: 12/20/19  1:30 AM   Specimen: Nasopharyngeal Swab  Result Value Ref Range Status   SARS Coronavirus 2 NEGATIVE NEGATIVE Final    Comment: (NOTE) SARS-CoV-2 target nucleic acids are NOT DETECTED.  The SARS-CoV-2 RNA is generally detectable in upper and lower respiratory specimens during the acute phase of infection. The lowest concentration of SARS-CoV-2 viral copies this assay can detect is 250 copies / mL. A negative result does not preclude SARS-CoV-2 infection and should not be used as the sole basis for treatment or other patient management decisions.  A negative result may occur with improper specimen collection / handling, submission of specimen other than nasopharyngeal swab, presence of viral mutation(s) within the areas targeted by this assay, and inadequate number of viral copies (<250 copies / mL). A negative result must be combined with clinical observations, patient history, and epidemiological information.  Fact Sheet for Patients:   StrictlyIdeas.no  Fact Sheet for Healthcare Providers: BankingDealers.co.za  This test is not yet approved or  cleared by the [place] FDA and has been authorized for detection and/or diagnosis of SARS-CoV-2 by FDA under an Emergency Use Authorization (EUA).  This EUA will remain in effect (meaning this test can be used) for the duration of the COVID-19 declaration under Section 564(b)(1) of the Act, 21 U.S.C. section 360bbb-3(b)(1), unless the authorization is terminated or revoked sooner.  Performed at [facility], [place] [address], [place], [place] [address]      Time coordinating discharge: Over 30 minutes  SIGNED:   Little Ishikawa, DO [facility] 12/23/2019, 1:19 PM Pager   If 7PM-7AM, please contact  night-coverage [email or web address]

## 2019-12-23 NOTE — Final Consult Note (Signed)
Consultant Final Sign-Off Note    Assessment/Final recommendations  Margaret Elliott is a 69 y.o. female followed by [facility] for SBO and stage 4 cancer   Wound care (if applicable):    Diet at discharge: per primary team   Activity at discharge: per primary team   Follow-up appointment:     Pending results:  Unresulted Labs (From admission, onward) Comment          Start     Ordered   12/21/19 0500  CBC  Daily,   R     Question:  Specimen collection method  Answer:  IV Team=IV Team collect   12/20/19 1418   12/21/19 1540  Basic metabolic panel  Daily,   R     Question:  Specimen collection method  Answer:  IV Team=IV Team collect   12/20/19 1418           Medication recommendations:   Other recommendations: if she does not tolerate a diet, would recommend palliative G tube placement.    Thank you for allowing [place] to participate in the care of your patient!  Please consult [place] again if you have further needs for your patient.  Rosario Adie 0/01/6760 9:25 AM    Subjective  NG out, tolerating clears   Objective  Vital signs in last 24 hours: Temp:  [97.9 F (36.6 C)-98.7 F (37.1 C)] 97.9 F (36.6 C) (07/03 0527) Pulse Rate:  [92-101] 92 (07/03 0527) Resp:  [14-17] 16 (07/03 0527) BP: (120-133)/(75-91) 133/88 (07/03 0527) SpO2:  [99 %-100 %] 100 % (07/03 0527) Weight:  [55.4 kg] 55.4 kg (07/03 0500)  General: NG out   Pertinent labs and Studies: Recent Labs    12/21/19 0200 12/22/19 0454 12/23/19 0325  WBC 7.9 7.9 7.8  HGB 9.9* 10.1* 9.8*  HCT 31.1* 31.4* 30.8*   BMET Recent Labs    12/22/19 0454 12/23/19 0325  NA 134* 134*  K 3.7 3.6  CL 100 100  CO2 26 25  GLUCOSE 115* 101*  BUN 7* 7*  CREATININE 0.57 0.59  CALCIUM 8.4* 8.2*   No results for input(s): LABURIN in the last 72 hours. Results for orders placed or performed during the hospital encounter of 12/19/19  SARS Coronavirus 2 by RT PCR (hospital order, performed in [facility]  hospital lab) Nasopharyngeal Nasopharyngeal Swab     Status: None   Collection Time: 12/20/19  1:30 AM   Specimen: Nasopharyngeal Swab  Result Value Ref Range Status   SARS Coronavirus 2 NEGATIVE NEGATIVE Final    Comment: (NOTE) SARS-CoV-2 target nucleic acids are NOT DETECTED.  The SARS-CoV-2 RNA is generally detectable in upper and lower respiratory specimens during the acute phase of infection. The lowest concentration of SARS-CoV-2 viral copies this assay can detect is 250 copies / mL. A negative result does not preclude SARS-CoV-2 infection and should not be used as the sole basis for treatment or other patient management decisions.  A negative result may occur with improper specimen collection / handling, submission of specimen other than nasopharyngeal swab, presence of viral mutation(s) within the areas targeted by this assay, and inadequate number of viral copies (<250 copies / mL). A negative result must be combined with clinical observations, patient history, and epidemiological information.  Fact Sheet for Patients:   StrictlyIdeas.no  Fact Sheet for Healthcare Providers: BankingDealers.co.za  This test is not yet approved or  cleared by the [place] FDA and has been authorized for detection and/or diagnosis of  SARS-CoV-2 by FDA under an Emergency Use Authorization (EUA).  This EUA will remain in effect (meaning this test can be used) for the duration of the COVID-19 declaration under Section 564(b)(1) of the Act, 21 U.S.C. section 360bbb-3(b)(1), unless the authorization is terminated or revoked sooner.  Performed at [facility], [place] [address], [place], [place] [address]     Imaging: [place] EKG SITE RITE  Result Date: 12/22/2019 If Site Rite image not attached, placement could not be confirmed due to current cardiac rhythm.

## 2019-12-24 DIAGNOSIS — Z9221 Personal history of antineoplastic chemotherapy: Secondary | ICD-10-CM | POA: Diagnosis not present

## 2019-12-24 DIAGNOSIS — D630 Anemia in neoplastic disease: Secondary | ICD-10-CM | POA: Diagnosis not present

## 2019-12-24 DIAGNOSIS — K56609 Unspecified intestinal obstruction, unspecified as to partial versus complete obstruction: Secondary | ICD-10-CM | POA: Diagnosis not present

## 2019-12-24 DIAGNOSIS — Z6821 Body mass index (BMI) 21.0-21.9, adult: Secondary | ICD-10-CM | POA: Diagnosis not present

## 2019-12-24 DIAGNOSIS — C787 Secondary malignant neoplasm of liver and intrahepatic bile duct: Secondary | ICD-10-CM | POA: Diagnosis not present

## 2019-12-24 DIAGNOSIS — C189 Malignant neoplasm of colon, unspecified: Secondary | ICD-10-CM | POA: Diagnosis not present

## 2019-12-24 DIAGNOSIS — C786 Secondary malignant neoplasm of retroperitoneum and peritoneum: Secondary | ICD-10-CM | POA: Diagnosis not present

## 2019-12-24 DIAGNOSIS — Z9049 Acquired absence of other specified parts of digestive tract: Secondary | ICD-10-CM | POA: Diagnosis not present

## 2019-12-24 DIAGNOSIS — Z87891 Personal history of nicotine dependence: Secondary | ICD-10-CM | POA: Diagnosis not present

## 2019-12-25 DIAGNOSIS — C786 Secondary malignant neoplasm of retroperitoneum and peritoneum: Secondary | ICD-10-CM | POA: Diagnosis not present

## 2019-12-25 DIAGNOSIS — C189 Malignant neoplasm of colon, unspecified: Secondary | ICD-10-CM | POA: Diagnosis not present

## 2019-12-25 DIAGNOSIS — D630 Anemia in neoplastic disease: Secondary | ICD-10-CM | POA: Diagnosis not present

## 2019-12-25 DIAGNOSIS — K56609 Unspecified intestinal obstruction, unspecified as to partial versus complete obstruction: Secondary | ICD-10-CM | POA: Diagnosis not present

## 2019-12-25 DIAGNOSIS — C787 Secondary malignant neoplasm of liver and intrahepatic bile duct: Secondary | ICD-10-CM | POA: Diagnosis not present

## 2019-12-25 DIAGNOSIS — Z87891 Personal history of nicotine dependence: Secondary | ICD-10-CM | POA: Diagnosis not present

## 2019-12-26 DIAGNOSIS — D630 Anemia in neoplastic disease: Secondary | ICD-10-CM | POA: Diagnosis not present

## 2019-12-26 DIAGNOSIS — Z87891 Personal history of nicotine dependence: Secondary | ICD-10-CM | POA: Diagnosis not present

## 2019-12-26 DIAGNOSIS — C189 Malignant neoplasm of colon, unspecified: Secondary | ICD-10-CM | POA: Diagnosis not present

## 2019-12-26 DIAGNOSIS — C786 Secondary malignant neoplasm of retroperitoneum and peritoneum: Secondary | ICD-10-CM | POA: Diagnosis not present

## 2019-12-26 DIAGNOSIS — K56609 Unspecified intestinal obstruction, unspecified as to partial versus complete obstruction: Secondary | ICD-10-CM | POA: Diagnosis not present

## 2019-12-26 DIAGNOSIS — C787 Secondary malignant neoplasm of liver and intrahepatic bile duct: Secondary | ICD-10-CM | POA: Diagnosis not present

## 2019-12-28 DIAGNOSIS — K56609 Unspecified intestinal obstruction, unspecified as to partial versus complete obstruction: Secondary | ICD-10-CM | POA: Diagnosis not present

## 2019-12-28 DIAGNOSIS — C189 Malignant neoplasm of colon, unspecified: Secondary | ICD-10-CM | POA: Diagnosis not present

## 2019-12-28 DIAGNOSIS — Z87891 Personal history of nicotine dependence: Secondary | ICD-10-CM | POA: Diagnosis not present

## 2019-12-28 DIAGNOSIS — D630 Anemia in neoplastic disease: Secondary | ICD-10-CM | POA: Diagnosis not present

## 2019-12-28 DIAGNOSIS — C786 Secondary malignant neoplasm of retroperitoneum and peritoneum: Secondary | ICD-10-CM | POA: Diagnosis not present

## 2019-12-28 DIAGNOSIS — C787 Secondary malignant neoplasm of liver and intrahepatic bile duct: Secondary | ICD-10-CM | POA: Diagnosis not present

## 2019-12-29 ENCOUNTER — Ambulatory Visit: Payer: Medicare Other | Admitting: *Deleted

## 2019-12-29 DIAGNOSIS — C182 Malignant neoplasm of ascending colon: Secondary | ICD-10-CM

## 2019-12-29 NOTE — Chronic Care Management (AMB) (Signed)
°  Care Management   EMMI [place] Follow-up  12/29/2019 Name: Margaret Elliott MRN: [id number] DOB: 04-Aug-1950   Outgoing call to patient to follow-up on Red EMMI flag from automated EMMI telephone call s/p hosptial discharge on 12/23/19. Patient states that the pharmacy did not have enough pain medication to fill the entire prescription but that she was able to get a partial fill and she has not been without medicine. Per patient, her nurse is working on getting the remainder of the medication. She was discharged home with hospice services and to follow-up with oncology. She does not need a PCP follow-up at this time.    Chong Sicilian, BSN, RN-BC Embedded Chronic Care Manager [facility] [place] [facility] Direct Dial: [phone]

## 2020-01-01 DIAGNOSIS — K56609 Unspecified intestinal obstruction, unspecified as to partial versus complete obstruction: Secondary | ICD-10-CM | POA: Diagnosis not present

## 2020-01-01 DIAGNOSIS — D630 Anemia in neoplastic disease: Secondary | ICD-10-CM | POA: Diagnosis not present

## 2020-01-01 DIAGNOSIS — Z87891 Personal history of nicotine dependence: Secondary | ICD-10-CM | POA: Diagnosis not present

## 2020-01-01 DIAGNOSIS — C189 Malignant neoplasm of colon, unspecified: Secondary | ICD-10-CM | POA: Diagnosis not present

## 2020-01-01 DIAGNOSIS — C786 Secondary malignant neoplasm of retroperitoneum and peritoneum: Secondary | ICD-10-CM | POA: Diagnosis not present

## 2020-01-01 DIAGNOSIS — C787 Secondary malignant neoplasm of liver and intrahepatic bile duct: Secondary | ICD-10-CM | POA: Diagnosis not present

## 2020-01-03 DIAGNOSIS — Z87891 Personal history of nicotine dependence: Secondary | ICD-10-CM | POA: Diagnosis not present

## 2020-01-03 DIAGNOSIS — D630 Anemia in neoplastic disease: Secondary | ICD-10-CM | POA: Diagnosis not present

## 2020-01-03 DIAGNOSIS — K56609 Unspecified intestinal obstruction, unspecified as to partial versus complete obstruction: Secondary | ICD-10-CM | POA: Diagnosis not present

## 2020-01-03 DIAGNOSIS — C787 Secondary malignant neoplasm of liver and intrahepatic bile duct: Secondary | ICD-10-CM | POA: Diagnosis not present

## 2020-01-03 DIAGNOSIS — C189 Malignant neoplasm of colon, unspecified: Secondary | ICD-10-CM | POA: Diagnosis not present

## 2020-01-03 DIAGNOSIS — C786 Secondary malignant neoplasm of retroperitoneum and peritoneum: Secondary | ICD-10-CM | POA: Diagnosis not present

## 2020-01-04 ENCOUNTER — Inpatient Hospital Stay: Payer: Medicare Other

## 2020-01-04 ENCOUNTER — Inpatient Hospital Stay: Payer: Medicare Other | Admitting: Oncology

## 2020-01-08 DIAGNOSIS — C189 Malignant neoplasm of colon, unspecified: Secondary | ICD-10-CM | POA: Diagnosis not present

## 2020-01-08 DIAGNOSIS — C786 Secondary malignant neoplasm of retroperitoneum and peritoneum: Secondary | ICD-10-CM | POA: Diagnosis not present

## 2020-01-08 DIAGNOSIS — K56609 Unspecified intestinal obstruction, unspecified as to partial versus complete obstruction: Secondary | ICD-10-CM | POA: Diagnosis not present

## 2020-01-08 DIAGNOSIS — Z87891 Personal history of nicotine dependence: Secondary | ICD-10-CM | POA: Diagnosis not present

## 2020-01-08 DIAGNOSIS — D630 Anemia in neoplastic disease: Secondary | ICD-10-CM | POA: Diagnosis not present

## 2020-01-08 DIAGNOSIS — C787 Secondary malignant neoplasm of liver and intrahepatic bile duct: Secondary | ICD-10-CM | POA: Diagnosis not present

## 2020-01-10 DIAGNOSIS — D630 Anemia in neoplastic disease: Secondary | ICD-10-CM | POA: Diagnosis not present

## 2020-01-10 DIAGNOSIS — C787 Secondary malignant neoplasm of liver and intrahepatic bile duct: Secondary | ICD-10-CM | POA: Diagnosis not present

## 2020-01-10 DIAGNOSIS — C189 Malignant neoplasm of colon, unspecified: Secondary | ICD-10-CM | POA: Diagnosis not present

## 2020-01-10 DIAGNOSIS — C786 Secondary malignant neoplasm of retroperitoneum and peritoneum: Secondary | ICD-10-CM | POA: Diagnosis not present

## 2020-01-10 DIAGNOSIS — Z87891 Personal history of nicotine dependence: Secondary | ICD-10-CM | POA: Diagnosis not present

## 2020-01-10 DIAGNOSIS — K56609 Unspecified intestinal obstruction, unspecified as to partial versus complete obstruction: Secondary | ICD-10-CM | POA: Diagnosis not present

## 2020-01-12 DIAGNOSIS — C786 Secondary malignant neoplasm of retroperitoneum and peritoneum: Secondary | ICD-10-CM | POA: Diagnosis not present

## 2020-01-12 DIAGNOSIS — C787 Secondary malignant neoplasm of liver and intrahepatic bile duct: Secondary | ICD-10-CM | POA: Diagnosis not present

## 2020-01-12 DIAGNOSIS — C189 Malignant neoplasm of colon, unspecified: Secondary | ICD-10-CM | POA: Diagnosis not present

## 2020-01-12 DIAGNOSIS — K56609 Unspecified intestinal obstruction, unspecified as to partial versus complete obstruction: Secondary | ICD-10-CM | POA: Diagnosis not present

## 2020-01-12 DIAGNOSIS — D630 Anemia in neoplastic disease: Secondary | ICD-10-CM | POA: Diagnosis not present

## 2020-01-12 DIAGNOSIS — Z87891 Personal history of nicotine dependence: Secondary | ICD-10-CM | POA: Diagnosis not present

## 2020-01-15 DIAGNOSIS — K56609 Unspecified intestinal obstruction, unspecified as to partial versus complete obstruction: Secondary | ICD-10-CM | POA: Diagnosis not present

## 2020-01-15 DIAGNOSIS — C786 Secondary malignant neoplasm of retroperitoneum and peritoneum: Secondary | ICD-10-CM | POA: Diagnosis not present

## 2020-01-15 DIAGNOSIS — D630 Anemia in neoplastic disease: Secondary | ICD-10-CM | POA: Diagnosis not present

## 2020-01-15 DIAGNOSIS — C787 Secondary malignant neoplasm of liver and intrahepatic bile duct: Secondary | ICD-10-CM | POA: Diagnosis not present

## 2020-01-15 DIAGNOSIS — C189 Malignant neoplasm of colon, unspecified: Secondary | ICD-10-CM | POA: Diagnosis not present

## 2020-01-15 DIAGNOSIS — Z87891 Personal history of nicotine dependence: Secondary | ICD-10-CM | POA: Diagnosis not present

## 2020-01-17 DIAGNOSIS — C787 Secondary malignant neoplasm of liver and intrahepatic bile duct: Secondary | ICD-10-CM | POA: Diagnosis not present

## 2020-01-17 DIAGNOSIS — K56609 Unspecified intestinal obstruction, unspecified as to partial versus complete obstruction: Secondary | ICD-10-CM | POA: Diagnosis not present

## 2020-01-17 DIAGNOSIS — C189 Malignant neoplasm of colon, unspecified: Secondary | ICD-10-CM | POA: Diagnosis not present

## 2020-01-17 DIAGNOSIS — C786 Secondary malignant neoplasm of retroperitoneum and peritoneum: Secondary | ICD-10-CM | POA: Diagnosis not present

## 2020-01-17 DIAGNOSIS — Z87891 Personal history of nicotine dependence: Secondary | ICD-10-CM | POA: Diagnosis not present

## 2020-01-17 DIAGNOSIS — D630 Anemia in neoplastic disease: Secondary | ICD-10-CM | POA: Diagnosis not present

## 2020-01-19 DIAGNOSIS — D630 Anemia in neoplastic disease: Secondary | ICD-10-CM | POA: Diagnosis not present

## 2020-01-19 DIAGNOSIS — C786 Secondary malignant neoplasm of retroperitoneum and peritoneum: Secondary | ICD-10-CM | POA: Diagnosis not present

## 2020-01-19 DIAGNOSIS — K56609 Unspecified intestinal obstruction, unspecified as to partial versus complete obstruction: Secondary | ICD-10-CM | POA: Diagnosis not present

## 2020-01-19 DIAGNOSIS — C787 Secondary malignant neoplasm of liver and intrahepatic bile duct: Secondary | ICD-10-CM | POA: Diagnosis not present

## 2020-01-19 DIAGNOSIS — Z87891 Personal history of nicotine dependence: Secondary | ICD-10-CM | POA: Diagnosis not present

## 2020-01-19 DIAGNOSIS — C189 Malignant neoplasm of colon, unspecified: Secondary | ICD-10-CM | POA: Diagnosis not present

## 2020-01-21 DIAGNOSIS — Z9049 Acquired absence of other specified parts of digestive tract: Secondary | ICD-10-CM | POA: Diagnosis not present

## 2020-01-21 DIAGNOSIS — Z9221 Personal history of antineoplastic chemotherapy: Secondary | ICD-10-CM | POA: Diagnosis not present

## 2020-01-21 DIAGNOSIS — D630 Anemia in neoplastic disease: Secondary | ICD-10-CM | POA: Diagnosis not present

## 2020-01-21 DIAGNOSIS — C787 Secondary malignant neoplasm of liver and intrahepatic bile duct: Secondary | ICD-10-CM | POA: Diagnosis not present

## 2020-01-21 DIAGNOSIS — Z6821 Body mass index (BMI) 21.0-21.9, adult: Secondary | ICD-10-CM | POA: Diagnosis not present

## 2020-01-21 DIAGNOSIS — C786 Secondary malignant neoplasm of retroperitoneum and peritoneum: Secondary | ICD-10-CM | POA: Diagnosis not present

## 2020-01-21 DIAGNOSIS — Z87891 Personal history of nicotine dependence: Secondary | ICD-10-CM | POA: Diagnosis not present

## 2020-01-21 DIAGNOSIS — C189 Malignant neoplasm of colon, unspecified: Secondary | ICD-10-CM | POA: Diagnosis not present

## 2020-01-21 DIAGNOSIS — K56609 Unspecified intestinal obstruction, unspecified as to partial versus complete obstruction: Secondary | ICD-10-CM | POA: Diagnosis not present

## 2020-01-22 DIAGNOSIS — Z87891 Personal history of nicotine dependence: Secondary | ICD-10-CM | POA: Diagnosis not present

## 2020-01-22 DIAGNOSIS — C786 Secondary malignant neoplasm of retroperitoneum and peritoneum: Secondary | ICD-10-CM | POA: Diagnosis not present

## 2020-01-22 DIAGNOSIS — D630 Anemia in neoplastic disease: Secondary | ICD-10-CM | POA: Diagnosis not present

## 2020-01-22 DIAGNOSIS — K56609 Unspecified intestinal obstruction, unspecified as to partial versus complete obstruction: Secondary | ICD-10-CM | POA: Diagnosis not present

## 2020-01-22 DIAGNOSIS — C189 Malignant neoplasm of colon, unspecified: Secondary | ICD-10-CM | POA: Diagnosis not present

## 2020-01-22 DIAGNOSIS — C787 Secondary malignant neoplasm of liver and intrahepatic bile duct: Secondary | ICD-10-CM | POA: Diagnosis not present

## 2020-01-24 DIAGNOSIS — K56609 Unspecified intestinal obstruction, unspecified as to partial versus complete obstruction: Secondary | ICD-10-CM | POA: Diagnosis not present

## 2020-01-24 DIAGNOSIS — C787 Secondary malignant neoplasm of liver and intrahepatic bile duct: Secondary | ICD-10-CM | POA: Diagnosis not present

## 2020-01-24 DIAGNOSIS — D630 Anemia in neoplastic disease: Secondary | ICD-10-CM | POA: Diagnosis not present

## 2020-01-24 DIAGNOSIS — Z87891 Personal history of nicotine dependence: Secondary | ICD-10-CM | POA: Diagnosis not present

## 2020-01-24 DIAGNOSIS — C189 Malignant neoplasm of colon, unspecified: Secondary | ICD-10-CM | POA: Diagnosis not present

## 2020-01-24 DIAGNOSIS — C786 Secondary malignant neoplasm of retroperitoneum and peritoneum: Secondary | ICD-10-CM | POA: Diagnosis not present

## 2020-01-26 DIAGNOSIS — Z87891 Personal history of nicotine dependence: Secondary | ICD-10-CM | POA: Diagnosis not present

## 2020-01-26 DIAGNOSIS — C189 Malignant neoplasm of colon, unspecified: Secondary | ICD-10-CM | POA: Diagnosis not present

## 2020-01-26 DIAGNOSIS — D630 Anemia in neoplastic disease: Secondary | ICD-10-CM | POA: Diagnosis not present

## 2020-01-26 DIAGNOSIS — K56609 Unspecified intestinal obstruction, unspecified as to partial versus complete obstruction: Secondary | ICD-10-CM | POA: Diagnosis not present

## 2020-01-26 DIAGNOSIS — C786 Secondary malignant neoplasm of retroperitoneum and peritoneum: Secondary | ICD-10-CM | POA: Diagnosis not present

## 2020-01-26 DIAGNOSIS — C787 Secondary malignant neoplasm of liver and intrahepatic bile duct: Secondary | ICD-10-CM | POA: Diagnosis not present

## 2020-01-29 DIAGNOSIS — K56609 Unspecified intestinal obstruction, unspecified as to partial versus complete obstruction: Secondary | ICD-10-CM | POA: Diagnosis not present

## 2020-01-29 DIAGNOSIS — C189 Malignant neoplasm of colon, unspecified: Secondary | ICD-10-CM | POA: Diagnosis not present

## 2020-01-29 DIAGNOSIS — C787 Secondary malignant neoplasm of liver and intrahepatic bile duct: Secondary | ICD-10-CM | POA: Diagnosis not present

## 2020-01-29 DIAGNOSIS — Z87891 Personal history of nicotine dependence: Secondary | ICD-10-CM | POA: Diagnosis not present

## 2020-01-29 DIAGNOSIS — C786 Secondary malignant neoplasm of retroperitoneum and peritoneum: Secondary | ICD-10-CM | POA: Diagnosis not present

## 2020-01-29 DIAGNOSIS — D630 Anemia in neoplastic disease: Secondary | ICD-10-CM | POA: Diagnosis not present

## 2020-01-31 DIAGNOSIS — C189 Malignant neoplasm of colon, unspecified: Secondary | ICD-10-CM | POA: Diagnosis not present

## 2020-01-31 DIAGNOSIS — D630 Anemia in neoplastic disease: Secondary | ICD-10-CM | POA: Diagnosis not present

## 2020-01-31 DIAGNOSIS — K56609 Unspecified intestinal obstruction, unspecified as to partial versus complete obstruction: Secondary | ICD-10-CM | POA: Diagnosis not present

## 2020-01-31 DIAGNOSIS — C787 Secondary malignant neoplasm of liver and intrahepatic bile duct: Secondary | ICD-10-CM | POA: Diagnosis not present

## 2020-01-31 DIAGNOSIS — C786 Secondary malignant neoplasm of retroperitoneum and peritoneum: Secondary | ICD-10-CM | POA: Diagnosis not present

## 2020-01-31 DIAGNOSIS — Z87891 Personal history of nicotine dependence: Secondary | ICD-10-CM | POA: Diagnosis not present

## 2020-02-01 DIAGNOSIS — C786 Secondary malignant neoplasm of retroperitoneum and peritoneum: Secondary | ICD-10-CM | POA: Diagnosis not present

## 2020-02-01 DIAGNOSIS — K56609 Unspecified intestinal obstruction, unspecified as to partial versus complete obstruction: Secondary | ICD-10-CM | POA: Diagnosis not present

## 2020-02-01 DIAGNOSIS — C787 Secondary malignant neoplasm of liver and intrahepatic bile duct: Secondary | ICD-10-CM | POA: Diagnosis not present

## 2020-02-01 DIAGNOSIS — Z87891 Personal history of nicotine dependence: Secondary | ICD-10-CM | POA: Diagnosis not present

## 2020-02-01 DIAGNOSIS — D630 Anemia in neoplastic disease: Secondary | ICD-10-CM | POA: Diagnosis not present

## 2020-02-01 DIAGNOSIS — C189 Malignant neoplasm of colon, unspecified: Secondary | ICD-10-CM | POA: Diagnosis not present

## 2020-02-02 DIAGNOSIS — C787 Secondary malignant neoplasm of liver and intrahepatic bile duct: Secondary | ICD-10-CM | POA: Diagnosis not present

## 2020-02-02 DIAGNOSIS — D630 Anemia in neoplastic disease: Secondary | ICD-10-CM | POA: Diagnosis not present

## 2020-02-02 DIAGNOSIS — Z87891 Personal history of nicotine dependence: Secondary | ICD-10-CM | POA: Diagnosis not present

## 2020-02-02 DIAGNOSIS — K56609 Unspecified intestinal obstruction, unspecified as to partial versus complete obstruction: Secondary | ICD-10-CM | POA: Diagnosis not present

## 2020-02-02 DIAGNOSIS — C189 Malignant neoplasm of colon, unspecified: Secondary | ICD-10-CM | POA: Diagnosis not present

## 2020-02-02 DIAGNOSIS — C786 Secondary malignant neoplasm of retroperitoneum and peritoneum: Secondary | ICD-10-CM | POA: Diagnosis not present

## 2020-02-05 DIAGNOSIS — Z87891 Personal history of nicotine dependence: Secondary | ICD-10-CM | POA: Diagnosis not present

## 2020-02-05 DIAGNOSIS — D630 Anemia in neoplastic disease: Secondary | ICD-10-CM | POA: Diagnosis not present

## 2020-02-05 DIAGNOSIS — C786 Secondary malignant neoplasm of retroperitoneum and peritoneum: Secondary | ICD-10-CM | POA: Diagnosis not present

## 2020-02-05 DIAGNOSIS — C189 Malignant neoplasm of colon, unspecified: Secondary | ICD-10-CM | POA: Diagnosis not present

## 2020-02-05 DIAGNOSIS — K56609 Unspecified intestinal obstruction, unspecified as to partial versus complete obstruction: Secondary | ICD-10-CM | POA: Diagnosis not present

## 2020-02-05 DIAGNOSIS — C787 Secondary malignant neoplasm of liver and intrahepatic bile duct: Secondary | ICD-10-CM | POA: Diagnosis not present

## 2020-02-07 DIAGNOSIS — Z87891 Personal history of nicotine dependence: Secondary | ICD-10-CM | POA: Diagnosis not present

## 2020-02-07 DIAGNOSIS — D630 Anemia in neoplastic disease: Secondary | ICD-10-CM | POA: Diagnosis not present

## 2020-02-07 DIAGNOSIS — K56609 Unspecified intestinal obstruction, unspecified as to partial versus complete obstruction: Secondary | ICD-10-CM | POA: Diagnosis not present

## 2020-02-07 DIAGNOSIS — C189 Malignant neoplasm of colon, unspecified: Secondary | ICD-10-CM | POA: Diagnosis not present

## 2020-02-07 DIAGNOSIS — C787 Secondary malignant neoplasm of liver and intrahepatic bile duct: Secondary | ICD-10-CM | POA: Diagnosis not present

## 2020-02-07 DIAGNOSIS — C786 Secondary malignant neoplasm of retroperitoneum and peritoneum: Secondary | ICD-10-CM | POA: Diagnosis not present

## 2020-02-21 DEATH — deceased
